# Patient Record
Sex: Female | Born: 1955 | Race: White | Hispanic: No | Smoking: Never smoker
Health system: Southern US, Community
[De-identification: ages and names within clinical notes are randomized; demographics above are authoritative.]

## PROBLEM LIST (undated history)

## (undated) DIAGNOSIS — J189 Pneumonia, unspecified organism: Secondary | ICD-10-CM

## (undated) DIAGNOSIS — R51 Headache: Secondary | ICD-10-CM

## (undated) DIAGNOSIS — G252 Other specified forms of tremor: Secondary | ICD-10-CM

## (undated) DIAGNOSIS — N189 Chronic kidney disease, unspecified: Secondary | ICD-10-CM

## (undated) DIAGNOSIS — I219 Acute myocardial infarction, unspecified: Secondary | ICD-10-CM

## (undated) DIAGNOSIS — K760 Fatty (change of) liver, not elsewhere classified: Secondary | ICD-10-CM

## (undated) DIAGNOSIS — IMO0001 Reserved for inherently not codable concepts without codable children: Secondary | ICD-10-CM

## (undated) DIAGNOSIS — B029 Zoster without complications: Secondary | ICD-10-CM

## (undated) DIAGNOSIS — B059 Measles without complication: Secondary | ICD-10-CM

## (undated) DIAGNOSIS — O2230 Deep phlebothrombosis in pregnancy, unspecified trimester: Secondary | ICD-10-CM

## (undated) DIAGNOSIS — C801 Malignant (primary) neoplasm, unspecified: Secondary | ICD-10-CM

## (undated) DIAGNOSIS — I10 Essential (primary) hypertension: Secondary | ICD-10-CM

## (undated) DIAGNOSIS — M25562 Pain in left knee: Secondary | ICD-10-CM

## (undated) DIAGNOSIS — Z6841 Body Mass Index (BMI) 40.0 and over, adult: Secondary | ICD-10-CM

## (undated) DIAGNOSIS — E785 Hyperlipidemia, unspecified: Secondary | ICD-10-CM

## (undated) DIAGNOSIS — M8580 Other specified disorders of bone density and structure, unspecified site: Secondary | ICD-10-CM

## (undated) DIAGNOSIS — G4730 Sleep apnea, unspecified: Secondary | ICD-10-CM

## (undated) DIAGNOSIS — R112 Nausea with vomiting, unspecified: Secondary | ICD-10-CM

## (undated) DIAGNOSIS — J40 Bronchitis, not specified as acute or chronic: Secondary | ICD-10-CM

## (undated) DIAGNOSIS — F329 Major depressive disorder, single episode, unspecified: Secondary | ICD-10-CM

## (undated) DIAGNOSIS — F32A Depression, unspecified: Secondary | ICD-10-CM

## (undated) DIAGNOSIS — I4891 Unspecified atrial fibrillation: Secondary | ICD-10-CM

## (undated) DIAGNOSIS — E119 Type 2 diabetes mellitus without complications: Secondary | ICD-10-CM

## (undated) DIAGNOSIS — R32 Unspecified urinary incontinence: Secondary | ICD-10-CM

## (undated) DIAGNOSIS — I2510 Atherosclerotic heart disease of native coronary artery without angina pectoris: Secondary | ICD-10-CM

## (undated) DIAGNOSIS — R519 Headache, unspecified: Secondary | ICD-10-CM

## (undated) DIAGNOSIS — N390 Urinary tract infection, site not specified: Secondary | ICD-10-CM

## (undated) DIAGNOSIS — F419 Anxiety disorder, unspecified: Secondary | ICD-10-CM

## (undated) DIAGNOSIS — I82409 Acute embolism and thrombosis of unspecified deep veins of unspecified lower extremity: Secondary | ICD-10-CM

## (undated) DIAGNOSIS — E039 Hypothyroidism, unspecified: Secondary | ICD-10-CM

## (undated) DIAGNOSIS — L7682 Other postprocedural complications of skin and subcutaneous tissue: Secondary | ICD-10-CM

## (undated) DIAGNOSIS — F319 Bipolar disorder, unspecified: Secondary | ICD-10-CM

## (undated) DIAGNOSIS — D649 Anemia, unspecified: Secondary | ICD-10-CM

## (undated) DIAGNOSIS — Z9889 Other specified postprocedural states: Secondary | ICD-10-CM

## (undated) DIAGNOSIS — W19XXXA Unspecified fall, initial encounter: Secondary | ICD-10-CM

## (undated) DIAGNOSIS — I509 Heart failure, unspecified: Secondary | ICD-10-CM

## (undated) DIAGNOSIS — K635 Polyp of colon: Secondary | ICD-10-CM

## (undated) HISTORY — DX: Body Mass Index (BMI) 40.0 and over, adult: Z684

## (undated) HISTORY — PX: TUBAL LIGATION: SHX77

## (undated) HISTORY — DX: Reserved for inherently not codable concepts without codable children: IMO0001

## (undated) HISTORY — DX: Chronic kidney disease, unspecified: N18.9

## (undated) HISTORY — PX: KNEE SURGERY: SHX244

## (undated) HISTORY — DX: Unspecified atrial fibrillation: I48.91

## (undated) HISTORY — DX: Sleep apnea, unspecified: G47.30

## (undated) HISTORY — PX: TONSILLECTOMY: SUR1361

## (undated) HISTORY — PX: BREAST MASS EXCISION: SHX1267

## (undated) HISTORY — DX: Hyperlipidemia, unspecified: E78.5

## (undated) HISTORY — DX: Acute embolism and thrombosis of unspecified deep veins of unspecified lower extremity: I82.409

## (undated) HISTORY — DX: Anxiety disorder, unspecified: F41.9

## (undated) HISTORY — DX: Type 2 diabetes mellitus without complications: E11.9

## (undated) HISTORY — DX: Acute myocardial infarction, unspecified: I21.9

## (undated) HISTORY — DX: Pain in left knee: M25.562

## (undated) HISTORY — DX: Essential (primary) hypertension: I10

## (undated) HISTORY — DX: Other specified disorders of bone density and structure, unspecified site: M85.80

## (undated) HISTORY — DX: Body mass index (BMI) 40.0-44.9, adult: E66.01

## (undated) HISTORY — PX: DILATION AND CURETTAGE OF UTERUS: SHX78

## (undated) HISTORY — DX: Atherosclerotic heart disease of native coronary artery without angina pectoris: I25.10

## (undated) HISTORY — DX: Depression, unspecified: F32.A

## (undated) HISTORY — PX: BASAL CELL CARCINOMA EXCISION: SHX1214

## (undated) HISTORY — DX: Major depressive disorder, single episode, unspecified: F32.9

## (undated) HISTORY — DX: Urinary tract infection, site not specified: N39.0

## (undated) HISTORY — DX: Polyp of colon: K63.5

---

## 1997-11-11 ENCOUNTER — Encounter: Payer: Self-pay | Admitting: Emergency Medicine

## 1997-11-11 ENCOUNTER — Emergency Department (HOSPITAL_COMMUNITY): Admission: EM | Admit: 1997-11-11 | Discharge: 1997-11-11 | Payer: Self-pay | Admitting: Emergency Medicine

## 1998-06-01 ENCOUNTER — Other Ambulatory Visit: Admission: RE | Admit: 1998-06-01 | Discharge: 1998-06-01 | Payer: Self-pay | Admitting: Obstetrics and Gynecology

## 1998-07-05 ENCOUNTER — Other Ambulatory Visit: Admission: RE | Admit: 1998-07-05 | Discharge: 1998-07-05 | Payer: Self-pay | Admitting: General Surgery

## 1998-08-03 ENCOUNTER — Ambulatory Visit (HOSPITAL_BASED_OUTPATIENT_CLINIC_OR_DEPARTMENT_OTHER): Admission: RE | Admit: 1998-08-03 | Discharge: 1998-08-03 | Payer: Self-pay | Admitting: General Surgery

## 1999-08-03 ENCOUNTER — Other Ambulatory Visit: Admission: RE | Admit: 1999-08-03 | Discharge: 1999-08-03 | Payer: Self-pay | Admitting: Obstetrics and Gynecology

## 1999-08-15 ENCOUNTER — Encounter: Admission: RE | Admit: 1999-08-15 | Discharge: 1999-08-15 | Payer: Self-pay | Admitting: Obstetrics and Gynecology

## 1999-08-15 ENCOUNTER — Encounter: Payer: Self-pay | Admitting: Obstetrics and Gynecology

## 2000-08-21 ENCOUNTER — Encounter: Admission: RE | Admit: 2000-08-21 | Discharge: 2000-08-21 | Payer: Self-pay | Admitting: Obstetrics and Gynecology

## 2000-08-21 ENCOUNTER — Encounter: Payer: Self-pay | Admitting: Obstetrics and Gynecology

## 2000-10-30 ENCOUNTER — Other Ambulatory Visit: Admission: RE | Admit: 2000-10-30 | Discharge: 2000-10-30 | Payer: Self-pay | Admitting: Obstetrics and Gynecology

## 2002-01-27 ENCOUNTER — Other Ambulatory Visit: Admission: RE | Admit: 2002-01-27 | Discharge: 2002-01-27 | Payer: Self-pay | Admitting: Obstetrics and Gynecology

## 2002-08-11 ENCOUNTER — Encounter: Admission: RE | Admit: 2002-08-11 | Discharge: 2002-08-11 | Payer: Self-pay | Admitting: Obstetrics and Gynecology

## 2002-08-11 ENCOUNTER — Encounter: Payer: Self-pay | Admitting: Obstetrics and Gynecology

## 2002-08-13 ENCOUNTER — Encounter: Payer: Self-pay | Admitting: Obstetrics and Gynecology

## 2002-08-13 ENCOUNTER — Ambulatory Visit (HOSPITAL_COMMUNITY): Admission: RE | Admit: 2002-08-13 | Discharge: 2002-08-13 | Payer: Self-pay | Admitting: Obstetrics and Gynecology

## 2003-04-20 ENCOUNTER — Other Ambulatory Visit: Admission: RE | Admit: 2003-04-20 | Discharge: 2003-04-20 | Payer: Self-pay | Admitting: Obstetrics and Gynecology

## 2003-04-21 ENCOUNTER — Encounter: Admission: RE | Admit: 2003-04-21 | Discharge: 2003-04-21 | Payer: Self-pay | Admitting: Obstetrics and Gynecology

## 2003-12-01 ENCOUNTER — Ambulatory Visit: Payer: Self-pay | Admitting: Family Medicine

## 2003-12-25 ENCOUNTER — Ambulatory Visit: Payer: Self-pay | Admitting: Family Medicine

## 2004-01-14 ENCOUNTER — Ambulatory Visit: Payer: Self-pay | Admitting: Family Medicine

## 2004-02-01 ENCOUNTER — Ambulatory Visit: Payer: Self-pay | Admitting: Family Medicine

## 2004-03-30 ENCOUNTER — Ambulatory Visit: Payer: Self-pay | Admitting: Family Medicine

## 2004-05-05 ENCOUNTER — Ambulatory Visit: Payer: Self-pay | Admitting: Family Medicine

## 2004-07-12 ENCOUNTER — Other Ambulatory Visit: Admission: RE | Admit: 2004-07-12 | Discharge: 2004-07-12 | Payer: Self-pay | Admitting: Obstetrics and Gynecology

## 2005-04-06 ENCOUNTER — Ambulatory Visit: Payer: Self-pay | Admitting: Family Medicine

## 2007-02-24 DIAGNOSIS — I219 Acute myocardial infarction, unspecified: Secondary | ICD-10-CM

## 2007-02-24 HISTORY — DX: Acute myocardial infarction, unspecified: I21.9

## 2007-03-05 ENCOUNTER — Ambulatory Visit: Payer: Self-pay | Admitting: Cardiology

## 2007-03-05 ENCOUNTER — Inpatient Hospital Stay (HOSPITAL_COMMUNITY): Admission: EM | Admit: 2007-03-05 | Discharge: 2007-03-07 | Payer: Self-pay | Admitting: Emergency Medicine

## 2007-03-05 DIAGNOSIS — I252 Old myocardial infarction: Secondary | ICD-10-CM | POA: Insufficient documentation

## 2007-03-20 ENCOUNTER — Ambulatory Visit: Payer: Self-pay | Admitting: Cardiology

## 2007-03-21 ENCOUNTER — Encounter (HOSPITAL_COMMUNITY): Admission: RE | Admit: 2007-03-21 | Discharge: 2007-06-19 | Payer: Self-pay | Admitting: Cardiology

## 2007-03-24 LAB — CONVERTED CEMR LAB: Pap Smear: NORMAL

## 2007-04-11 ENCOUNTER — Ambulatory Visit: Payer: Self-pay | Admitting: Cardiology

## 2007-04-16 ENCOUNTER — Ambulatory Visit: Payer: Self-pay

## 2007-04-16 ENCOUNTER — Ambulatory Visit: Payer: Self-pay | Admitting: Cardiovascular Disease

## 2007-05-09 ENCOUNTER — Ambulatory Visit: Payer: Self-pay | Admitting: Cardiology

## 2007-05-16 ENCOUNTER — Ambulatory Visit: Payer: Self-pay | Admitting: Cardiology

## 2007-05-16 ENCOUNTER — Observation Stay (HOSPITAL_COMMUNITY): Admission: EM | Admit: 2007-05-16 | Discharge: 2007-05-17 | Payer: Self-pay | Admitting: Emergency Medicine

## 2007-06-03 ENCOUNTER — Ambulatory Visit: Payer: Self-pay | Admitting: Cardiology

## 2007-06-20 ENCOUNTER — Encounter (HOSPITAL_COMMUNITY): Admission: RE | Admit: 2007-06-20 | Discharge: 2007-07-24 | Payer: Self-pay | Admitting: Cardiology

## 2007-07-24 LAB — HM MAMMOGRAPHY: HM Mammogram: NORMAL

## 2007-08-15 ENCOUNTER — Ambulatory Visit: Payer: Self-pay | Admitting: Cardiovascular Disease

## 2007-10-24 ENCOUNTER — Ambulatory Visit: Payer: Self-pay | Admitting: Cardiology

## 2007-12-05 ENCOUNTER — Ambulatory Visit: Payer: Self-pay | Admitting: Cardiovascular Disease

## 2008-01-20 ENCOUNTER — Encounter (INDEPENDENT_AMBULATORY_CARE_PROVIDER_SITE_OTHER): Payer: Self-pay | Admitting: *Deleted

## 2008-02-04 ENCOUNTER — Ambulatory Visit: Payer: Self-pay | Admitting: Family Medicine

## 2008-02-04 DIAGNOSIS — E079 Disorder of thyroid, unspecified: Secondary | ICD-10-CM | POA: Insufficient documentation

## 2008-02-04 DIAGNOSIS — I10 Essential (primary) hypertension: Secondary | ICD-10-CM | POA: Insufficient documentation

## 2008-02-04 DIAGNOSIS — E669 Obesity, unspecified: Secondary | ICD-10-CM | POA: Insufficient documentation

## 2008-02-05 ENCOUNTER — Encounter (INDEPENDENT_AMBULATORY_CARE_PROVIDER_SITE_OTHER): Payer: Self-pay | Admitting: *Deleted

## 2008-02-05 LAB — CONVERTED CEMR LAB: TSH: 1.79 microintl units/mL (ref 0.35–5.50)

## 2008-03-12 ENCOUNTER — Encounter (INDEPENDENT_AMBULATORY_CARE_PROVIDER_SITE_OTHER): Payer: Self-pay | Admitting: *Deleted

## 2008-03-12 ENCOUNTER — Ambulatory Visit: Payer: Self-pay | Admitting: Family Medicine

## 2008-03-13 ENCOUNTER — Encounter (INDEPENDENT_AMBULATORY_CARE_PROVIDER_SITE_OTHER): Payer: Self-pay | Admitting: *Deleted

## 2008-04-01 ENCOUNTER — Encounter: Payer: Self-pay | Admitting: Cardiology

## 2008-04-02 ENCOUNTER — Encounter: Payer: Self-pay | Admitting: Family Medicine

## 2008-04-02 ENCOUNTER — Ambulatory Visit: Payer: Self-pay | Admitting: Cardiovascular Disease

## 2008-05-18 ENCOUNTER — Telehealth: Payer: Self-pay | Admitting: Cardiology

## 2008-05-21 ENCOUNTER — Ambulatory Visit: Payer: Self-pay | Admitting: Internal Medicine

## 2008-05-21 ENCOUNTER — Ambulatory Visit: Payer: Self-pay | Admitting: Vascular Surgery

## 2008-05-21 ENCOUNTER — Telehealth: Payer: Self-pay | Admitting: Family Medicine

## 2008-05-21 ENCOUNTER — Encounter: Payer: Self-pay | Admitting: Internal Medicine

## 2008-05-21 ENCOUNTER — Observation Stay (HOSPITAL_COMMUNITY): Admission: EM | Admit: 2008-05-21 | Discharge: 2008-05-22 | Payer: Self-pay | Admitting: Emergency Medicine

## 2008-05-22 ENCOUNTER — Encounter: Payer: Self-pay | Admitting: Internal Medicine

## 2008-05-23 DIAGNOSIS — E785 Hyperlipidemia, unspecified: Secondary | ICD-10-CM | POA: Insufficient documentation

## 2008-05-23 DIAGNOSIS — I10 Essential (primary) hypertension: Secondary | ICD-10-CM | POA: Insufficient documentation

## 2008-05-23 DIAGNOSIS — IMO0001 Reserved for inherently not codable concepts without codable children: Secondary | ICD-10-CM

## 2008-05-23 HISTORY — DX: Reserved for inherently not codable concepts without codable children: IMO0001

## 2008-06-03 ENCOUNTER — Ambulatory Visit: Payer: Self-pay | Admitting: *Deleted

## 2008-06-03 ENCOUNTER — Inpatient Hospital Stay (HOSPITAL_COMMUNITY): Admission: AD | Admit: 2008-06-03 | Discharge: 2008-06-11 | Payer: Self-pay | Admitting: *Deleted

## 2008-06-16 ENCOUNTER — Other Ambulatory Visit (HOSPITAL_COMMUNITY): Admission: RE | Admit: 2008-06-16 | Discharge: 2008-06-23 | Payer: Self-pay | Admitting: Psychiatry

## 2008-06-23 ENCOUNTER — Ambulatory Visit: Payer: Self-pay

## 2008-06-23 ENCOUNTER — Encounter: Payer: Self-pay | Admitting: Family Medicine

## 2008-06-23 ENCOUNTER — Ambulatory Visit: Payer: Self-pay | Admitting: Cardiology

## 2008-06-23 ENCOUNTER — Ambulatory Visit: Payer: Self-pay | Admitting: Cardiovascular Disease

## 2008-06-23 DIAGNOSIS — R609 Edema, unspecified: Secondary | ICD-10-CM | POA: Insufficient documentation

## 2008-06-24 ENCOUNTER — Telehealth: Payer: Self-pay | Admitting: Cardiology

## 2008-07-02 ENCOUNTER — Encounter: Payer: Self-pay | Admitting: Family Medicine

## 2008-07-02 ENCOUNTER — Ambulatory Visit: Payer: Self-pay | Admitting: Cardiology

## 2008-07-02 DIAGNOSIS — I219 Acute myocardial infarction, unspecified: Secondary | ICD-10-CM | POA: Insufficient documentation

## 2008-07-03 LAB — CONVERTED CEMR LAB
Eosinophils Relative: 1.3 % (ref 0.0–5.0)
Lymphocytes Relative: 26 % (ref 12.0–46.0)
Monocytes Relative: 8.3 % (ref 3.0–12.0)
Neutrophils Relative %: 63.2 % (ref 43.0–77.0)
Platelets: 132 10*3/uL — ABNORMAL LOW (ref 150.0–400.0)
WBC: 4.8 10*3/uL (ref 4.5–10.5)

## 2008-08-12 ENCOUNTER — Telehealth (INDEPENDENT_AMBULATORY_CARE_PROVIDER_SITE_OTHER): Payer: Self-pay | Admitting: *Deleted

## 2008-08-20 ENCOUNTER — Telehealth: Payer: Self-pay | Admitting: Cardiology

## 2008-09-01 ENCOUNTER — Ambulatory Visit (HOSPITAL_COMMUNITY): Admission: RE | Admit: 2008-09-01 | Discharge: 2008-09-01 | Payer: Self-pay | Admitting: Obstetrics and Gynecology

## 2008-09-01 ENCOUNTER — Encounter (INDEPENDENT_AMBULATORY_CARE_PROVIDER_SITE_OTHER): Payer: Self-pay | Admitting: Obstetrics and Gynecology

## 2008-09-09 ENCOUNTER — Encounter (INDEPENDENT_AMBULATORY_CARE_PROVIDER_SITE_OTHER): Payer: Self-pay | Admitting: *Deleted

## 2008-09-09 ENCOUNTER — Ambulatory Visit: Payer: Self-pay | Admitting: Family Medicine

## 2008-11-19 ENCOUNTER — Ambulatory Visit: Payer: Self-pay | Admitting: Cardiology

## 2008-12-03 ENCOUNTER — Encounter (INDEPENDENT_AMBULATORY_CARE_PROVIDER_SITE_OTHER): Payer: Self-pay | Admitting: *Deleted

## 2008-12-16 ENCOUNTER — Ambulatory Visit: Payer: Self-pay | Admitting: Internal Medicine

## 2008-12-16 DIAGNOSIS — J019 Acute sinusitis, unspecified: Secondary | ICD-10-CM | POA: Insufficient documentation

## 2009-02-16 ENCOUNTER — Ambulatory Visit: Payer: Self-pay | Admitting: Family

## 2009-06-24 ENCOUNTER — Encounter: Payer: Self-pay | Admitting: Cardiology

## 2009-07-02 ENCOUNTER — Encounter: Payer: Self-pay | Admitting: Cardiology

## 2009-07-15 ENCOUNTER — Encounter: Payer: Self-pay | Admitting: Family Medicine

## 2009-07-15 HISTORY — PX: COLONOSCOPY: SHX174

## 2009-07-23 ENCOUNTER — Encounter (INDEPENDENT_AMBULATORY_CARE_PROVIDER_SITE_OTHER): Payer: Self-pay | Admitting: *Deleted

## 2009-08-25 ENCOUNTER — Telehealth (INDEPENDENT_AMBULATORY_CARE_PROVIDER_SITE_OTHER): Payer: Self-pay | Admitting: *Deleted

## 2009-09-10 ENCOUNTER — Encounter: Admission: RE | Admit: 2009-09-10 | Discharge: 2009-09-10 | Payer: Self-pay | Admitting: Obstetrics and Gynecology

## 2009-09-14 ENCOUNTER — Ambulatory Visit: Payer: Self-pay | Admitting: Family Medicine

## 2009-09-14 DIAGNOSIS — F319 Bipolar disorder, unspecified: Secondary | ICD-10-CM | POA: Insufficient documentation

## 2009-09-14 LAB — CONVERTED CEMR LAB
Albumin: 4.1 g/dL (ref 3.5–5.2)
Alkaline Phosphatase: 68 units/L (ref 39–117)
Basophils Relative: 0.3 % (ref 0.0–3.0)
Chloride: 102 meq/L (ref 96–112)
Cholesterol: 146 mg/dL (ref 0–200)
Creatinine, Ser: 1 mg/dL (ref 0.4–1.2)
Eosinophils Relative: 2.4 % (ref 0.0–5.0)
LDL Cholesterol: 74 mg/dL (ref 0–99)
MCV: 91.1 fL (ref 78.0–100.0)
Monocytes Absolute: 0.2 10*3/uL (ref 0.1–1.0)
Monocytes Relative: 5.4 % (ref 3.0–12.0)
Neutrophils Relative %: 55.4 % (ref 43.0–77.0)
Potassium: 4.1 meq/L (ref 3.5–5.1)
RBC: 4.53 M/uL (ref 3.87–5.11)
Total Protein: 6.7 g/dL (ref 6.0–8.3)
Triglycerides: 103 mg/dL (ref 0.0–149.0)
VLDL: 20.6 mg/dL (ref 0.0–40.0)
WBC: 3.9 10*3/uL — ABNORMAL LOW (ref 4.5–10.5)

## 2009-10-25 ENCOUNTER — Telehealth (INDEPENDENT_AMBULATORY_CARE_PROVIDER_SITE_OTHER): Payer: Self-pay | Admitting: *Deleted

## 2009-10-26 ENCOUNTER — Encounter: Payer: Self-pay | Admitting: Internal Medicine

## 2009-11-19 ENCOUNTER — Ambulatory Visit: Payer: Self-pay | Admitting: Cardiology

## 2009-12-02 ENCOUNTER — Ambulatory Visit: Payer: Self-pay | Admitting: Family Medicine

## 2009-12-21 ENCOUNTER — Ambulatory Visit: Payer: Self-pay | Admitting: Family Medicine

## 2010-01-05 ENCOUNTER — Ambulatory Visit: Payer: Self-pay | Admitting: Family Medicine

## 2010-01-05 ENCOUNTER — Ambulatory Visit (HOSPITAL_BASED_OUTPATIENT_CLINIC_OR_DEPARTMENT_OTHER)
Admission: RE | Admit: 2010-01-05 | Discharge: 2010-01-05 | Payer: Self-pay | Source: Home / Self Care | Attending: Family Medicine | Admitting: Family Medicine

## 2010-01-05 DIAGNOSIS — M25562 Pain in left knee: Secondary | ICD-10-CM | POA: Insufficient documentation

## 2010-01-05 DIAGNOSIS — M62838 Other muscle spasm: Secondary | ICD-10-CM | POA: Insufficient documentation

## 2010-01-05 DIAGNOSIS — R1011 Right upper quadrant pain: Secondary | ICD-10-CM | POA: Insufficient documentation

## 2010-01-05 DIAGNOSIS — M25561 Pain in right knee: Secondary | ICD-10-CM | POA: Insufficient documentation

## 2010-01-06 ENCOUNTER — Telehealth (INDEPENDENT_AMBULATORY_CARE_PROVIDER_SITE_OTHER): Payer: Self-pay | Admitting: *Deleted

## 2010-02-11 ENCOUNTER — Other Ambulatory Visit: Payer: Self-pay | Admitting: Family Medicine

## 2010-02-11 ENCOUNTER — Ambulatory Visit
Admission: RE | Admit: 2010-02-11 | Discharge: 2010-02-11 | Payer: Self-pay | Source: Home / Self Care | Attending: Family Medicine | Admitting: Family Medicine

## 2010-02-11 ENCOUNTER — Encounter: Payer: Self-pay | Admitting: Family Medicine

## 2010-02-11 DIAGNOSIS — D485 Neoplasm of uncertain behavior of skin: Secondary | ICD-10-CM | POA: Insufficient documentation

## 2010-02-11 LAB — CBC WITH DIFFERENTIAL/PLATELET
Basophils Absolute: 0 10*3/uL (ref 0.0–0.1)
Basophils Relative: 0.4 % (ref 0.0–3.0)
Eosinophils Absolute: 0.1 10*3/uL (ref 0.0–0.7)
Eosinophils Relative: 1.6 % (ref 0.0–5.0)
HCT: 42.4 % (ref 36.0–46.0)
Hemoglobin: 14.8 g/dL (ref 12.0–15.0)
Lymphocytes Relative: 26.8 % (ref 12.0–46.0)
Lymphs Abs: 1.2 10*3/uL (ref 0.7–4.0)
MCHC: 35 g/dL (ref 30.0–36.0)
MCV: 90.1 fl (ref 78.0–100.0)
Monocytes Absolute: 0.3 10*3/uL (ref 0.1–1.0)
Monocytes Relative: 7.1 % (ref 3.0–12.0)
Neutro Abs: 2.8 10*3/uL (ref 1.4–7.7)
Neutrophils Relative %: 64.1 % (ref 43.0–77.0)
Platelets: 114 10*3/uL — ABNORMAL LOW (ref 150.0–400.0)
RBC: 4.71 Mil/uL (ref 3.87–5.11)
RDW: 13.4 % (ref 11.5–14.6)
WBC: 4.4 10*3/uL — ABNORMAL LOW (ref 4.5–10.5)

## 2010-02-11 LAB — LIPID PANEL
Cholesterol: 140 mg/dL (ref 0–200)
HDL: 55.6 mg/dL (ref >39.00)
LDL Cholesterol: 69 mg/dL (ref 0–99)
Total CHOL/HDL Ratio: 3
Triglycerides: 77 mg/dL (ref 0.0–149.0)
VLDL: 15.4 mg/dL (ref 0.0–40.0)

## 2010-02-11 LAB — HEPATIC FUNCTION PANEL
ALT: 15 U/L (ref 0–35)
AST: 17 U/L (ref 0–37)
Albumin: 4.1 g/dL (ref 3.5–5.2)
Alkaline Phosphatase: 78 U/L (ref 39–117)
Bilirubin, Direct: 0.2 mg/dL (ref 0.0–0.3)
Total Bilirubin: 1 mg/dL (ref 0.3–1.2)
Total Protein: 7 g/dL (ref 6.0–8.3)

## 2010-02-11 LAB — BASIC METABOLIC PANEL
BUN: 20 mg/dL (ref 6–23)
CO2: 33 mEq/L — ABNORMAL HIGH (ref 19–32)
Calcium: 9.6 mg/dL (ref 8.4–10.5)
Chloride: 101 mEq/L (ref 96–112)
Creatinine, Ser: 1 mg/dL (ref 0.4–1.2)
GFR: 63.52 mL/min (ref >60.00)
Glucose, Bld: 77 mg/dL (ref 70–99)
Potassium: 4.1 mEq/L (ref 3.5–5.1)
Sodium: 142 mEq/L (ref 135–145)

## 2010-02-11 LAB — TSH: TSH: 1.36 u[IU]/mL (ref 0.35–5.50)

## 2010-02-14 LAB — CONVERTED CEMR LAB: Vit D, 25-Hydroxy: 34 ng/mL (ref 30–89)

## 2010-02-20 LAB — CONVERTED CEMR LAB
ALT: 20 units/L (ref 0–35)
AST: 22 units/L (ref 0–37)
Albumin: 3.7 g/dL (ref 3.5–5.2)
Alkaline Phosphatase: 66 units/L (ref 39–117)
BUN: 21 mg/dL (ref 6–23)
Basophils Absolute: 0 10*3/uL (ref 0.0–0.1)
Basophils Relative: 0.2 % (ref 0.0–3.0)
CO2: 30 meq/L (ref 19–32)
Chloride: 104 meq/L (ref 96–112)
Creatinine, Ser: 1 mg/dL (ref 0.4–1.2)
Eosinophils Absolute: 0.1 10*3/uL (ref 0.0–0.7)
Eosinophils Relative: 2 % (ref 0.0–5.0)
Hemoglobin: 14.4 g/dL (ref 12.0–15.0)
LDL Cholesterol: 83 mg/dL (ref 0–99)
Lymphocytes Relative: 22 % (ref 12.0–46.0)
MCV: 89.8 fL (ref 78.0–100.0)
Neutrophils Relative %: 68.6 % (ref 43.0–77.0)
RBC: 4.6 M/uL (ref 3.87–5.11)
Total CHOL/HDL Ratio: 3.4
Total Protein: 6.7 g/dL (ref 6.0–8.3)
VLDL: 21 mg/dL (ref 0–40)
WBC: 4.4 10*3/uL — ABNORMAL LOW (ref 4.5–10.5)

## 2010-02-21 ENCOUNTER — Other Ambulatory Visit: Payer: Self-pay | Admitting: Obstetrics and Gynecology

## 2010-02-21 DIAGNOSIS — Z09 Encounter for follow-up examination after completed treatment for conditions other than malignant neoplasm: Secondary | ICD-10-CM

## 2010-02-22 NOTE — Procedures (Signed)
Summary: Colonoscopy/[facility]  Colonoscopy/[facility]   Imported By: Lanelle Bal 08/02/2009 09:49:31  _____________________________________________________________________  External Attachment:    Type:   Image     Comment:   External Document

## 2010-02-22 NOTE — Miscellaneous (Signed)
Summary: colon report  Clinical Lists Changes  Observations: Added new observation of COLONRECACT: Repeat colonoscopy in 10 years.   (07/15/2009 8:51) Added new observation of COLONOSCOPY:  Results: Normal. Location:  [facility].    (07/15/2009 8:51)        Colonoscopy  Procedure date:  07/15/2009  Findings:       Results: Normal. Location:  [facility].     Comments:      Repeat colonoscopy in 10 years.

## 2010-02-22 NOTE — Assessment & Plan Note (Signed)
Summary: F1Y   Visit Type:  Follow-up Primary Provider:  Neena Rhymes MD  CC:  CAD.  History of Present Illness: The patient returns for followup of known coronary disease. 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 Last cath was  2010 April and it seemed to be improved flow in this.  She has had one episode of chest discomfort that woke her from her sleep on September 30. She took one nitroglycerin and it went away. She went back to bed. She has had no recurrence of this. She has been exercising routinely. She has lost 70 pounds through diet! She denies any shortness of breath, PND or orthopnea. She has had no palpitations, presyncope or syncope.  Current Medications (verified): 1)  Nitrostat 0.4 Mg Subl (Nitroglycerin) .... As Needed 2)  Simvastatin 20 Mg Tabs (Simvastatin) .[name].. 1 By Mouth At Bedtime- 3)  Bayer Low Strength 81 Mg Tbec (Aspirin) .[name].. 1 By Mouth Once Daily 4)  Plavix 75 Mg Tabs (Clopidogrel Bisulfate) .[name].. 1 By Mouth Once Daily 5)  Depakote 500 Mg Tbec (Divalproex Sodium) .... 3 By Mouth Once Daily 6)  Clonazepam 0.5 Mg Tabs (Clonazepam) .... As Needed 7)  Trazodone Hcl 50 Mg Tabs (Trazodone Hcl) .[name].. 1 By Mouth As Needed 8)  Abilify 10 Mg Tabs (Aripiprazole) .... 1/2 By Mouth Once Daily 9)  Multivitamins   Tabs (Multiple Vitamin) .... Daily 10)  Maxair Autohaler 200 Mcg/inh Aerb (Pirbuterol Acetate) .... As Needed 11)  Maxzide-25 37.5-25 Mg Tabs (Triamterene-Hctz) .... One Po Daily  Allergies (verified): 1)  ! * Eggs 2)  ! Asa 3)  ! Barbiturates 4)  ! * Carbamazapine 5)  ! * Clarithromycin 6)  ! Coumadin 7)  ! Erythromycin 8)  ! * Indinavir 9)  ! * Itraconazole 10)  ! Ketoconazole 11)  ! * Nefazodone 12)  ! * Nelfinavir 13)  ! * Phenytoin 14)  ! * Rifampin 15)  ! * Ritonavir 16)  ! * Troleandomycin  Past History:  Past Medical History: Reviewed history from 11/19/2008 and no changes required.  1. 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   2. Well-preserved ejection fraction.   3. Hypertension.   4. Depression.   5. Superficial lower extremity venous thrombosis.   6. Dyslipidemia  7. Psychiatric hospitalization 5/10  Past Surgical History: Reviewed history from 05/23/2008 and no changes required. 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   Vital Signs:  Patient profile:   55 year old female Height:      71 inches Weight:      227 pounds BMI:     31.77 Pulse rate:   82 / minute Resp:     16 per minute BP sitting:   108 / 70  (right arm)  Vitals Entered By: Marrion Coy, CNA (November 19, 2009 2:01 PM)  Physical Exam  General:  Well developed, well nourished, in no acute distress. Head:  normocephalic and atraumatic Neck:  Neck supple, no JVD. No masses, thyromegaly or abnormal cervical nodes. Chest Wall:  no deformities or breast masses noted Lungs:  Clear bilaterally to auscultation and percussion. Heart:  Non-displaced PMI, chest non-tender; regular rate and rhythm, S1, S2 without murmurs, rubs or gallops. Carotid upstroke normal, no bruit. Normal abdominal aortic  size, no bruits. Femorals normal pulses, no bruits. Pedals normal pulses. No edema, no varicosities. Abdomen:  Bowel sounds positive; abdomen soft and non-tender without masses, organomegaly, or hernias noted. No hepatosplenomegaly. Msk:  Back normal, normal gait. Muscle strength and tone normal. Pulses:  pulses normal in all 4 extremities Extremities:  No clubbing or cyanosis. Neurologic:  Alert and oriented x 3. Skin:  Intact without lesions or rashes. Cervical Nodes:  no significant adenopathy Inguinal Nodes:  no significant adenopathy Psych:  Normal affect.   EKG  Procedure date:  11/19/2009  Findings:      Sinus rhythm, rate 82, axis within normal  limits, intervals within normal limits, no acute ST-T wave changes  Impression & Recommendations:  Problem # 1:  MYOCARDIAL INFARCTION, HX OF (ICD-412) She has had one episode of chest pain no further episodes. No further investigation is indicated though she will continue with risk reduction. Orders: EKG w/ Interpretation (93000)  Problem # 2:  HYPERTENSION (ICD-401.9) She loses weight we may be able to reduce her Maxide were eliminated. For now she will remain on the meds listed.  Problem # 3:  DYSLIPIDEMIA (ICD-272.4) Her lipids were excellent in August. No change in therapy is indicated.  Patient Instructions: 1)  Your physician recommends that you schedule a follow-up appointment in: 12 months with Dr Antoine Poche 2)  Your physician recommends that you continue on your current medications as directed. Please refer to the Current Medication list given to you today.

## 2010-02-22 NOTE — Miscellaneous (Signed)
[facility]   Imported By: Lanelle Bal 11/03/2009 13:13:51  _____________________________________________________________________  External Attachment:    Type:   Image     Comment:   External Document

## 2010-02-22 NOTE — Assessment & Plan Note (Signed)
Summary: SINUS INFECTION/KN   Vital Signs:  Patient profile:   55 year old female Height:      71 inches Weight:      225 pounds O2 Sat:      99 % on Room air Temp:     98.0 degrees F oral Pulse rate:   85 / minute BP sitting:   110 / 76  (left arm)  Vitals Entered By: Jeremy Johann CMA (December 02, 2009 8:58 AM)  O2 Flow:  Room air CC: cough, facial pain, drainage, sore throatx2days   History of Present Illness: 55 yo woman here today for ? sinus infxn.  sxs initially started as a cold 1 week ago and then 2 days ago developed facial pain and pressure.  no fever.  bilateral ear pain, nasal congestion, sore throat only due to nasal drainage.  no cough.  + sick contacts.  Current Medications (verified): 1)  Nitrostat 0.4 Mg Subl (Nitroglycerin) .... As Needed 2)  Simvastatin 20 Mg Tabs (Simvastatin) .[name].. 1 By Mouth At Bedtime- 3)  Bayer Low Strength 81 Mg Tbec (Aspirin) .[name].. 1 By Mouth Once Daily 4)  Plavix 75 Mg Tabs (Clopidogrel Bisulfate) .[name].. 1 By Mouth Once Daily 5)  Depakote 500 Mg Tbec (Divalproex Sodium) .... 3 By Mouth Once Daily 6)  Clonazepam 0.5 Mg Tabs (Clonazepam) .... As Needed 7)  Trazodone Hcl 50 Mg Tabs (Trazodone Hcl) .[name].. 1 By Mouth As Needed 8)  Abilify 10 Mg Tabs (Aripiprazole) .... 1/2 By Mouth Once Daily 9)  Multivitamins   Tabs (Multiple Vitamin) .... Daily 10)  Maxair Autohaler 200 Mcg/inh Aerb (Pirbuterol Acetate) .... As Needed 11)  Maxzide-25 37.5-25 Mg Tabs (Triamterene-Hctz) .... One Po Daily  Allergies (verified): 1)  ! * Eggs 2)  ! Asa 3)  ! Barbiturates 4)  ! * Carbamazapine 5)  ! * Clarithromycin 6)  ! Coumadin 7)  ! Erythromycin 8)  ! * Indinavir 9)  ! * Itraconazole 10)  ! Ketoconazole 11)  ! * Nefazodone 12)  ! * Nelfinavir 13)  ! * Phenytoin 14)  ! * Rifampin 15)  ! * Ritonavir 16)  ! * Troleandomycin  Review of Systems      See HPI  Physical Exam  General:  Well-developed,well-nourished,in no acute distress;  alert,appropriate and cooperative throughout examination Head:  Normocephalic and atraumatic without obvious abnormalities. No apparent alopecia or balding.  + TTP over frontal and maxillary sinuses Eyes:  no injxn or inflammation Ears:  TMs retracted bilaterally Nose:  + congestion Mouth:  Oral mucosa and oropharynx without lesions or exudates.  Teeth in good repair. Neck:  No deformities, masses, or tenderness noted. Lungs:  Normal respiratory effort, chest expands symmetrically. Lungs are clear to auscultation, no crackles or wheezes. Heart:  Normal rate and regular rhythm. S1 and S2 normal without gallop, murmur, click, rub or other extra sounds.   Impression & Recommendations:  Problem # 1:  SINUSITIS- ACUTE-NOS (ICD-461.9) Assessment Unchanged pt w/ sinus infxn.  start abx.  reviewed supportive care and red flags that should prompt return.  Pt expresses understanding and is in agreement w/ this plan. Her updated medication list for this problem includes:    Amoxicillin 500 Mg Tabs (Amoxicillin) .[name]... 2 tabs by mouth two times a day x10 days.  take w/ food.  Complete Medication List: 1)  Nitrostat 0.4 Mg Subl (Nitroglycerin) .... As needed 2)  Simvastatin 20 Mg Tabs (Simvastatin) .[name].. 1 by mouth at bedtime- 3)  Bayer  Low Strength 81 Mg Tbec (Aspirin) .[name].. 1 by mouth once daily 4)  Plavix 75 Mg Tabs (Clopidogrel bisulfate) .[name].. 1 by mouth once daily 5)  Depakote 500 Mg Tbec (Divalproex sodium) .... 3 by mouth once daily 6)  Clonazepam 0.5 Mg Tabs (Clonazepam) .... As needed 7)  Trazodone Hcl 50 Mg Tabs (Trazodone hcl) .[name].. 1 by mouth as needed 8)  Abilify 10 Mg Tabs (Aripiprazole) .... 1/2 by mouth once daily 9)  Multivitamins Tabs (Multiple vitamin) .... Daily 10)  Maxair Autohaler 200 Mcg/inh Aerb (Pirbuterol acetate) .... As needed 11)  Maxzide-25 37.5-25 Mg Tabs (Triamterene-hctz) .... One po daily 12)  Amoxicillin 500 Mg Tabs (Amoxicillin) .... 2 tabs by mouth two times a day  x10 days.  take w/ food.  Patient Instructions: 1)  This is definitely a sinus infection 2)  Take the Amoxicillin as directed- take w/ food to avoid upset stomach 3)  Drink plenty of fluids 4)  REST!!! 5)  Call with any questions or concerns! 6)  Hang in there!! Prescriptions: AMOXICILLIN 500 MG TABS (AMOXICILLIN) 2 tabs by mouth two times a day x10 days.  take w/ food.  #40 x 0   Entered and Authorized by:   Neena Rhymes MD   Signed by:   Neena Rhymes MD on 12/02/2009   Method used:   [facility]  [address] [phone][facility] (retail[facility]       [address]       [place], [place]  [address]       Ph: [phone] or [phone]       Fax: [phone]   RxID:   [id number]    Orders Added: 1)  Est. Patient Level III [10626]

## 2010-02-22 NOTE — Progress Notes (Signed)
Summary: simvastatin refill   Phone Note Refill Request Message from:  Fax from Pharmacy on October 25, 2009 1:25 PM  Refills Requested: Medication #1:  SIMVASTATIN 20 MG TABS 1 by mouth at bedtime- [facility] [place] [facility] fax [phone]  Initial call taken by: Okey Regal Spring,  October 25, 2009 1:25 PM    Prescriptions: SIMVASTATIN 20 MG TABS (SIMVASTATIN) 1 by mouth at bedtime-  #30 x 6   Entered by:   Doristine Devoid CMA   Authorized by:   Neena Rhymes MD   Signed by:   Doristine Devoid CMA on 10/25/2009   Method used:   [facility]        [facility]  [address] [phone][facility] (retail)       [address]       [place], [place]  [address]       Ph: [phone] or [phone]       Fax: [phone]   RxID:   [id number]

## 2010-02-22 NOTE — Assessment & Plan Note (Signed)
Summary: bronchtisis/kdc   Vital Signs:  Patient profile:   55 year old female Height:      71 inches Weight:      226 pounds BMI:     31.63 Temp:     87.8 degrees F oral BP sitting:   120 / 88  Vitals Entered By: Kandice Hams (February 16, 2009 3:23 PM) CC: c/o cough with production, yellow, congestion, facial pain, Abdominal Pain   Primary Care Provider:  Neena Rhymes MD  CC:  c/o cough with production, yellow, congestion, facial pain, and Abdominal Pain.  History of Present Illness: Kathleen [name] is a 55 year old female who presents with c/o cough, sinus pressure,  yellow nasal discharge- yellow discharge with coughing as well.  Has not used any otc meds.  Symptoms started over 1 week ago.    Dyspepsia History:      The patient has positive alarm features of dyspepsia which include dysphagia.    Allergies: 1)  ! * Eggs 2)  ! Asa 3)  ! Barbiturates 4)  ! * Carbamazapine 5)  ! * Clarithromycin 6)  ! Coumadin 7)  ! Erythromycin 8)  ! * Indinavir 9)  ! * Itraconazole 10)  ! Ketoconazole 11)  ! * Nefazodone 12)  ! * Nelfinavir 13)  ! * Phenytoin 14)  ! * Rifampin 15)  ! * Ritonavir 16)  ! * Troleandomycin  Review of Systems       Denies fever, + cough, +sore throat from nasal drip.  Physical Exam  General:  Well-developed,well-nourished,in no acute distress; alert,appropriate and cooperative throughout examination Head:  Normocephalic and atraumatic without obvious abnormalities. No apparent alopecia or balding. Eyes:  PERRLA Mouth:  Oral mucosa and oropharynx without lesions or exudates.  Teeth in good repair. Neck:  No deformities, masses, or tenderness noted. Lungs:  Normal respiratory effort, chest expands symmetrically. Lungs are clear to auscultation, no crackles or wheezes. Heart:  Normal rate and regular rhythm. S1 and S2 normal without gallop, murmur, click, rub or other extra sounds. Neurologic:  fine resting tremor   Impression &  Recommendations:  Problem # 1:  SINUSITIS- ACUTE-NOS (ICD-461.9) Assessment New Will plan to treat with Amoxicillin.  Patient instructed to  call if you develop fever over 101, increasing sinus pressure, pain with eye movement, increased facial tenderness of swelling, or if you develop visual changes.  Complete Medication List: 1)  Nitrostat 0.4 Mg Subl (Nitroglycerin) .... As needed 2)  Simvastatin 20 Mg Tabs (Simvastatin) .[name].. 1 by mouth at bedtime--out 3)  Bayer Low Strength 81 Mg Tbec (Aspirin) .[name].. 1 by mouth once daily 4)  Plavix 75 Mg Tabs (Clopidogrel bisulfate) .[name].. 1 by mouth once daily 5)  Fish Oil  6)  D-3  7)  Depakote 500 Mg Tbec (Divalproex sodium) .... 3 by mouth once daily 8)  Clonazepam 0.5 Mg Tabs (Clonazepam) .... As needed 9)  Trazodone Hcl 50 Mg Tabs (Trazodone hcl) .[name].. 1 by mouth as needed 10)  Abilify 10 Mg Tabs (Aripiprazole) .... 1/2 by mouth once daily 11)  Multivitamins Tabs (Multiple vitamin) .... Daily 12)  Maxair Autohaler 200 Mcg/inh Aerb (Pirbuterol acetate) .... As needed 13)  Maxzide-25 37.5-25 Mg Tabs (Triamterene-hctz) .... One po daily 14)  Amoxicillin 500 Mg Caps (Amoxicillin) .[name].. 1 three times a day 15)  Amoxicillin 500 Mg Cap (Amoxicillin) .... Take 1 capsule by mouth three times a day x 10 days   Patient Instructions: 1)  Call if you develop  fever over 101, increasing sinus pressure, pain with eye movement, increased facial tenderness of swelling, or if you develop visual changes. 2)  Call if symptoms not resolved when you complete antibiotics. Prescriptions: AMOXICILLIN 500 MG CAP (AMOXICILLIN) Take 1 capsule by mouth three times a day X 10 days  #30 x 0   Entered and Authorized by:   Lemont Fillers FNP   Signed by:   Lemont Fillers FNP on 02/16/2009   Method used:   [facility]        [facility]  [address] [phone]* (retail)       [address]       [place], [place]  [address]       Ph: [phone]  or [phone]       Fax: [phone]   RxID:   [id number]

## 2010-02-22 NOTE — Assessment & Plan Note (Signed)
Summary: RTO & LAB/CBST   Vital Signs:  Patient profile:   55 year old female Weight:      223 pounds Pulse rate:   78 / minute BP sitting:   110 / 80  (left arm)  Vitals Entered By: Doristine Devoid CMA (September 14, 2009 8:52 AM) CC: roa and labs   History of Present Illness: 55 yo woman here today for f/u on  1) HTN- BP well controlled on maxzide.  no CP, SOB, HAs, visual changes, edema.  [facility]  2) Hyperlipidemia- due for labs.  no N/V, myalgias, abd pain.  3) Obesity- trying very hard to lose weight.  exercising, eating healthy.  wasn't aware that meds caused weight gain  4) Bipolar- seeing Dr Nolen Mu.  stable and doing well on Abilify and Depakote.  Preventive Screening-Counseling & Management  Alcohol-Tobacco     Alcohol drinks/day: 0     Smoking Status: never  Caffeine-Diet-Exercise     Does Patient Exercise: yes     [facility]      Drug Use:  never.    Problems Prior to Update: 1)  Sinusitis- Acute-nos  (ICD-461.9) 2)  Acut Myocard Infarct Uns Site Subsqt Epis Care  (ICD-410.92) 3)  Edema  (ICD-782.3) 4)  Hypertension  (ICD-401.9) 5)  Dyslipidemia  (ICD-272.4) 6)  Special Screening For Malignant Neoplasms Colon  (ICD-V76.51) 7)  Healthy Adult Female  (ICD-V70.0) 8)  Obesity  (ICD-278.00) 9)  Unspecified Disorder of Thyroid  (ICD-246.9) 10)  Hypertension, Benign Essential  (ICD-401.1) 11)  Myocardial Infarction, Hx of  (ICD-412)  Current Medications (verified): 1)  Nitrostat 0.4 Mg Subl (Nitroglycerin) .... As Needed 2)  Simvastatin 20 Mg Tabs (Simvastatin) .[name].. 1 By Mouth At Bedtime- 3)  Bayer Low Strength 81 Mg Tbec (Aspirin) .[name].. 1 By Mouth Once Daily 4)  Plavix 75 Mg Tabs (Clopidogrel Bisulfate) .[name].. 1 By Mouth Once Daily 5)  Depakote 500 Mg Tbec (Divalproex Sodium) .... 3 By Mouth Once Daily 6)  Clonazepam 0.5 Mg Tabs (Clonazepam) .... As Needed 7)  Trazodone Hcl 50 Mg Tabs (Trazodone Hcl) .[name].. 1 By Mouth As  Needed 8)  Abilify 10 Mg Tabs (Aripiprazole) .... 1/2 By Mouth Once Daily 9)  Multivitamins   Tabs (Multiple Vitamin) .... Daily 10)  Maxair Autohaler 200 Mcg/inh Aerb (Pirbuterol Acetate) .... As Needed 11)  Maxzide-25 37.5-25 Mg Tabs (Triamterene-Hctz) .... One Po Daily  Allergies (verified): 1)  ! * Eggs 2)  ! Asa 3)  ! Barbiturates 4)  ! * Carbamazapine 5)  ! * Clarithromycin 6)  ! Coumadin 7)  ! Erythromycin 8)  ! * Indinavir 9)  ! * Itraconazole 10)  ! Ketoconazole 11)  ! * Nefazodone 12)  ! * Nelfinavir 13)  ! * Phenytoin 14)  ! * Rifampin 15)  ! * Ritonavir 16)  ! * Troleandomycin  Past History:  Past Medical History: Last updated: 11/19/2008  1. 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   2. Well-preserved ejection fraction.   3. Hypertension.   4. Depression.   5. Superficial lower extremity venous thrombosis.   6. Dyslipidemia  7. Psychiatric hospitalization 5/10  Past Surgical History: Last updated: 05/23/2008 Benign tumor removed from breast age 50 Removed cartilage rt knee Tubal ligation Tonsiilectomy  Social History: Last updated: 05/23/2008  She lives alone in [place] and works at [employer] as a  Administrator, arts.  She has no history of alcohol, tobacco,   or drug abuse.   Social History: Drug Use:  never  Review of Systems      See HPI  Physical Exam  General:  Well-developed,well-nourished,in no acute distress; alert,appropriate and cooperative throughout examination Head:  Normocephalic and atraumatic without obvious abnormalities. No apparent alopecia or balding. Neck:  No deformities, masses, or tenderness noted. Lungs:  Normal respiratory effort, chest expands symmetrically. Lungs are clear to auscultation, no crackles or wheezes. Heart:  Normal rate and regular rhythm. S1 and S2 normal without gallop, murmur, click, rub or other extra sounds. Pulses:  +2 carotid,  radial, DP Extremities:  no C/C/E Cervical Nodes:  No lymphadenopathy noted Psych:  Oriented X3, memory intact for recent and remote, normally interactive, good eye contact, not anxious appearing, and not depressed appearing.     Impression & Recommendations:  Problem # 1:  HYPERTENSION (ICD-401.9) Assessment Unchanged BP well controlled.  due for labs.  no med changes at this time. Her updated medication list for this problem includes:    Maxzide-25 37.5-25 Mg Tabs (Triamterene-hctz) ..... One po daily  Orders: TLB-BMP (Basic Metabolic Panel-BMET) (80048-METABOL) TLB-CBC Platelet - w/Differential (85025-CBCD) TLB-TSH (Thyroid Stimulating Hormone) (84443-TSH)  Problem # 2:  DYSLIPIDEMIA (ICD-272.4) Assessment: Unchanged due for labs.  adjust meds as needed. Her updated medication list for this problem includes:    Simvastatin 20 Mg Tabs (Simvastatin) .[name]... 1 by mouth at bedtime-  Orders: Venipuncture ([address]109) TLB-Lipid Panel (80061-LIPID) TLB-Hepatic/Liver Function Pnl (80076-HEPATIC)  Problem # 3:  OBESITY (ICD-278.00) Assessment: Unchanged applauded pt's efforts at diet and exercise.  she continues to lose weight.  has lost >70lbs to this point.  Problem # 4:  BIPOLAR DISORDER UNSPECIFIED (ICD-296.80) Assessment: New follows w/ Dr Nolen Mu  Complete Medication List: 1)  Nitrostat 0.4 Mg Subl (Nitroglycerin) .... As needed 2)  Simvastatin 20 Mg Tabs (Simvastatin) .[name].. 1 by mouth at bedtime- 3)  Bayer Low Strength 81 Mg Tbec (Aspirin) .[name].. 1 by mouth once daily 4)  Plavix 75 Mg Tabs (Clopidogrel bisulfate) .[name].. 1 by mouth once daily 5)  Depakote 500 Mg Tbec (Divalproex sodium) .... 3 by mouth once daily 6)  Clonazepam 0.5 Mg Tabs (Clonazepam) .... As needed 7)  Trazodone Hcl 50 Mg Tabs (Trazodone hcl) .[name].. 1 by mouth as needed 8)  Abilify 10 Mg Tabs (Aripiprazole) .... 1/2 by mouth once daily 9)  Multivitamins Tabs (Multiple vitamin) .... Daily 10)  Maxair Autohaler 200  Mcg/inh Aerb (Pirbuterol acetate) .... As needed 11)  Maxzide-25 37.5-25 Mg Tabs (Triamterene-hctz) .... One po daily  Patient Instructions: 1)  Follow up in 6 months for your complete physical- do not eat before this appt 2)  Keep up the good work on diet and exercise- you look great!!! 3)  We'll notify you of your lab results 4)  Call with any questions or concerns 5)  Good luck with back to school!!

## 2010-02-22 NOTE — Letter (Signed)
Summary: [facility] [employer] [facility] [employer]  [place] [facility] [employer] [facility] [employer]   Imported By: Marylou Mccoy 10/06/2009 08:59:48  _____________________________________________________________________  External Attachment:    Type:   Image     Comment:   External Document

## 2010-02-22 NOTE — Letter (Signed)
Summary: [facility]   [facility]   Imported By: Roderic Ovens 12/08/2009 13:44:27  _____________________________________________________________________  External Attachment:    Type:   Image     Comment:   External Document

## 2010-02-22 NOTE — Progress Notes (Signed)
Summary: simvastatin refill   Phone Note Refill Request Message from:  Fax from Pharmacy on August 25, 2009 8:24 AM  Refills Requested: Medication #1:  SIMVASTATIN 20 MG TABS 1 by mouth at bedtime[facility] fax [phone]  Initial call taken by: Okey Regal [place],  August 25, 2009 8:26 AM    New/Updated Medications: SIMVASTATIN 20 MG TABS (SIMVASTATIN) 1 by mouth at bedtime- DUE FOR CPX AND LABS FOR FURTHER REFILLS Prescriptions: SIMVASTATIN 20 MG TABS (SIMVASTATIN) 1 by mouth at bedtime- DUE FOR CPX AND LABS FOR FURTHER REFILLS  #30 x 0   Entered by:   Doristine Devoid CMA   Authorized by:   Neena Rhymes MD   Signed by:   Doristine Devoid CMA on 08/25/2009   Method used:   [facility]        [facility]  [address] [phone][facility] (retail)       [address]       [place], [place]  [address]       Ph: [phone] or [phone]       Fax: [phone]   RxID:   [id number]
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
## 2010-03-10 ENCOUNTER — Ambulatory Visit
Admission: RE | Admit: 2010-03-10 | Discharge: 2010-03-10 | Disposition: A | Discharge status: Home / Self Care | Payer: Self-pay | Source: Ambulatory Visit | Attending: Obstetrics and Gynecology | Admitting: Obstetrics and Gynecology

## 2010-03-10 DIAGNOSIS — Z09 Encounter for follow-up examination after completed treatment for conditions other than malignant neoplasm: Secondary | ICD-10-CM

## 2010-03-29 ENCOUNTER — Encounter: Payer: Self-pay | Admitting: Internal Medicine

## 2010-03-29 ENCOUNTER — Ambulatory Visit (INDEPENDENT_AMBULATORY_CARE_PROVIDER_SITE_OTHER): Payer: BC Managed Care – PPO | Admitting: Internal Medicine

## 2010-03-29 DIAGNOSIS — S46909A Unspecified injury of unspecified muscle, fascia and tendon at shoulder and upper arm level, unspecified arm, initial encounter: Secondary | ICD-10-CM | POA: Insufficient documentation

## 2010-03-29 DIAGNOSIS — S4980XA Other specified injuries of shoulder and upper arm, unspecified arm, initial encounter: Secondary | ICD-10-CM | POA: Insufficient documentation

## 2010-03-29 DIAGNOSIS — Z23 Encounter for immunization: Secondary | ICD-10-CM

## 2010-04-05 NOTE — Assessment & Plan Note (Signed)
Summary: Dog scratch//fd   Vital Signs:  Patient profile:   55 year old female Weight:      233.6 pounds BMI:     32.70 Temp:     98.3 degrees F oral Pulse rate:   72 / minute Resp:     15 per minute BP sitting:   118 / 80  (left arm) Cuff size:   large  Vitals Entered By: Shonna Chock CMA (March 29, 2010 4:04 PM) CC: Dog scratch last night (sister's dog) Comments Side Note: Allergy list updated, patient indicated those were med she was NOT to take while on Study Drug which she is no longer taking. Allergies removed except eggs./Chrae [facility] CMA  March 29, 2010 4:11 PM    Primary Care Provider:  Neena Rhymes MD  CC:  Dog scratch last night (sister's dog).  History of Present Illness:     RUE scratched by dog's rear claws last night when it jumped across her. Super Glue was applied last night. She  reports redness, tenderness, and blood loss."It bled all night long"; she is on Plavix & ASA 81mg  once daily.  Pressure dressing applied @ 8:30  this am by School Nurse.  Allergies: 1)  ! * Eggs  Physical Exam  General:  well-nourished,in no acute distress; alert,appropriate and cooperative throughout examination Pulses:  R and Lradial  pulses are full and equal bilaterally Skin:  33 mm closed  laceration with 4X4 mm abrasion  oozing blood superiorly R forearm. Associated  mild ecchymosis    Impression & Recommendations:  Problem # 1:  ARM INJURY (ICD-959.2) laceration   Complete Medication List: 1)  Nitrostat 0.4 Mg Subl (Nitroglycerin) .... As needed 2)  Simvastatin 20 Mg Tabs (Simvastatin) .[name].. 1 by mouth at bedtime- 3)  Bayer Low Strength 81 Mg Tbec (Aspirin) .[name].. 1 by mouth once daily 4)  Plavix 75 Mg Tabs (Clopidogrel bisulfate) .[name].. 1 by mouth once daily 5)  Depakote 500 Mg Tbec (Divalproex sodium) .... 3 by mouth once daily 6)  Clonazepam 0.5 Mg Tabs (Clonazepam) .... As needed 7)  Trazodone Hcl 50 Mg Tabs (Trazodone hcl) .[name].. 1 by mouth as needed 8)  Abilify 10  Mg Tabs (Aripiprazole) .... 1/2 by mouth once daily 9)  Multivitamins Tabs (Multiple vitamin) .... Daily 10)  Maxair Autohaler 200 Mcg/inh Aerb (Pirbuterol acetate) .... As needed 11)  Maxzide-25 37.5-25 Mg Tabs (Triamterene-hctz) .... One po daily 12)  Omnaris 50 Mcg/act Susp (Ciclesonide) .... 2 sprays each nostril once daily 13)  Vicodin 5-500 Mg Tabs (Hydrocodone-acetaminophen) .[name].. 1-2 tabs by mouth q6 as needed for pain 14)  Cyclobenzaprine Hcl 10 Mg Tabs (Cyclobenzaprine hcl) .[name].. 1 by mouth 2 times daily as needed for back/neck pain.  will cause drowsiness 15)  Naprosyn 500 Mg Tabs (Naproxen) .[name].. 1 two times a day x7-10 days and then as needed. 16)  Triamcinolone Acetonide 0.1 % Oint (Triamcinolone acetonide) .... Apply to affected areas two times a day.  disp 1 tube 17)  Bactroban 2 % Crea (Mupirocin calcium) .... Apply once daily after cleansing  Other Orders: Tdap => 79yrs IM (40981) Admin 1st Vaccine (19147)  Patient Instructions: 1)  Report pain , pus , fever & red streaks . Apply antibiotic cream if redness or pus present. Telfa dressing changed daily. Prescriptions: BACTROBAN 2 % CREA (MUPIROCIN CALCIUM) apply once daily after cleansing  #15 grams x 0   Entered and Authorized by:   Marga Melnick MD   Signed  by:   Marga Melnick MD on 03/29/2010   Method used:   Print then Give to Patient   RxID:   [id number]    Orders Added: 1)  Tdap => 88yrs IM [90715] 2)  Admin 1st Vaccine [90471] 3)  Est. Patient Level II [19379]   Immunizations Administered:  Tetanus Vaccine:    Vaccine Type: Tdap    Site: right deltoid    Mfr: GlaxoSmithKline    Dose: 0.5 ml    Route: IM    Given by: Shonna Chock CMA    Exp. Date: 11/12/2011    Lot #: [id number]    VIS given: 12/11/07 version given March 29, 2010.   Immunizations Administered:  Tetanus Vaccine:    Vaccine Type: Tdap    Site: right deltoid    Mfr: GlaxoSmithKline    Dose: 0.5 ml    Route: IM    Given by:  Shonna Chock CMA    Exp. Date: 11/12/2011    Lot #: [id number]    VIS given: 12/11/07 version given March 29, 2010.

## 2010-04-13 ENCOUNTER — Other Ambulatory Visit: Payer: Self-pay | Admitting: Cardiology

## 2010-04-15 ENCOUNTER — Telehealth: Payer: Self-pay | Admitting: Family Medicine

## 2010-04-15 NOTE — Telephone Encounter (Signed)
Discuss with patient.Kathleen [name] CMA    

## 2010-04-15 NOTE — Telephone Encounter (Signed)
Shingles do not reoccur in response to chicken pox exposure.  They usually appear in times of great physical (illness) or emotional stress.  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

## 2010-04-15 NOTE — Telephone Encounter (Signed)
Please advise Felecia Raymie Trani CMA   

## 2010-04-15 NOTE — Telephone Encounter (Signed)
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

## 2010-04-23 ENCOUNTER — Other Ambulatory Visit: Payer: Self-pay | Admitting: *Deleted

## 2010-04-23 MED ORDER — TRIAMTERENE-HCTZ 37.5-25 MG PO TABS: 1.0000 | ORAL_TABLET | Freq: Every day | ORAL | Status: DC

## 2010-04-30 LAB — CBC
HCT: 45.9 % (ref 36.0–46.0)
Hemoglobin: 15.9 g/dL — ABNORMAL HIGH (ref 12.0–15.0)
MCHC: 34.6 g/dL (ref 30.0–36.0)
RBC: 5.1 MIL/uL (ref 3.87–5.11)
RDW: 13.1 % (ref 11.5–15.5)

## 2010-04-30 LAB — COMPREHENSIVE METABOLIC PANEL
ALT: 27 U/L (ref 0–35)
Alkaline Phosphatase: 70 U/L (ref 39–117)
BUN: 21 mg/dL (ref 6–23)
CO2: 27 mEq/L (ref 19–32)
GFR calc non Af Amer: 47 mL/min — ABNORMAL LOW (ref >60)
Glucose, Bld: 95 mg/dL (ref 70–99)
Potassium: 3.9 mEq/L (ref 3.5–5.1)
Sodium: 142 mEq/L (ref 135–145)
Total Protein: 7.2 g/dL (ref 6.0–8.3)

## 2010-04-30 LAB — PREGNANCY, URINE: Preg Test, Ur: NEGATIVE

## 2010-05-03 LAB — CBC
HCT: 38.4 % (ref 36.0–46.0)
Hemoglobin: 13.2 g/dL (ref 12.0–15.0)
MCHC: 34.4 g/dL (ref 30.0–36.0)
RBC: 4.15 MIL/uL (ref 3.87–5.11)
RDW: 13.1 % (ref 11.5–15.5)

## 2010-05-03 LAB — T3, FREE: T3, Free: 3 pg/mL (ref 2.3–4.2)

## 2010-05-03 LAB — COMPREHENSIVE METABOLIC PANEL
Alkaline Phosphatase: 65 U/L (ref 39–117)
BUN: 21 mg/dL (ref 6–23)
CO2: 29 mEq/L (ref 19–32)
Calcium: 9.8 mg/dL (ref 8.4–10.5)
GFR calc non Af Amer: 49 mL/min — ABNORMAL LOW (ref >60)
Glucose, Bld: 80 mg/dL (ref 70–99)
Total Protein: 6 g/dL (ref 6.0–8.3)

## 2010-05-03 LAB — DRUGS OF ABUSE SCREEN W/O ALC, ROUTINE URINE
Barbiturate Quant, Ur: NEGATIVE
Benzodiazepines.: NEGATIVE
Cocaine Metabolites: NEGATIVE
Creatinine,U: 68.1 mg/dL
Marijuana Metabolite: NEGATIVE
Methadone: NEGATIVE
Opiate Screen, Urine: NEGATIVE
Phencyclidine (PCP): NEGATIVE
Propoxyphene: NEGATIVE

## 2010-05-03 LAB — URINALYSIS, MICROSCOPIC ONLY
Ketones, ur: NEGATIVE mg/dL
Nitrite: NEGATIVE
Protein, ur: NEGATIVE mg/dL
Urobilinogen, UA: 0.2 mg/dL (ref 0.0–1.0)

## 2010-05-03 LAB — T4, FREE: Free T4: 0.94 ng/dL (ref 0.80–1.80)

## 2010-05-03 LAB — LITHIUM LEVEL: Lithium Lvl: 1.47 mEq/L — ABNORMAL HIGH (ref 0.80–1.40)

## 2010-05-03 LAB — TSH: TSH: 8.645 u[IU]/mL — ABNORMAL HIGH (ref 0.350–4.500)

## 2010-05-03 LAB — MAGNESIUM: Magnesium: 2.2 mg/dL (ref 1.5–2.5)

## 2010-05-04 LAB — CBC
HCT: 37.9 % (ref 36.0–46.0)
Hemoglobin: 13.4 g/dL (ref 12.0–15.0)
RBC: 4.16 MIL/uL (ref 3.87–5.11)
RDW: 13.5 % (ref 11.5–15.5)
WBC: 5.8 10*3/uL (ref 4.0–10.5)

## 2010-05-04 LAB — COMPREHENSIVE METABOLIC PANEL
ALT: 44 U/L — ABNORMAL HIGH (ref 0–35)
Alkaline Phosphatase: 71 U/L (ref 39–117)
BUN: 15 mg/dL (ref 6–23)
CO2: 29 mEq/L (ref 19–32)
Chloride: 103 mEq/L (ref 96–112)
GFR calc non Af Amer: >60 mL/min (ref >60)
Glucose, Bld: 96 mg/dL (ref 70–99)
Potassium: 4.1 mEq/L (ref 3.5–5.1)
Sodium: 137 mEq/L (ref 135–145)
Total Bilirubin: 0.6 mg/dL (ref 0.3–1.2)
Total Protein: 6.4 g/dL (ref 6.0–8.3)

## 2010-05-04 LAB — URINALYSIS, ROUTINE W REFLEX MICROSCOPIC
Bilirubin Urine: NEGATIVE
Glucose, UA: NEGATIVE mg/dL
Ketones, ur: NEGATIVE mg/dL
Leukocytes, UA: NEGATIVE
Protein, ur: NEGATIVE mg/dL
pH: 7.5 (ref 5.0–8.0)

## 2010-05-04 LAB — BRAIN NATRIURETIC PEPTIDE: Pro B Natriuretic peptide (BNP): 86 pg/mL (ref 0.0–100.0)

## 2010-05-04 LAB — DIFFERENTIAL
Basophils Absolute: 0 10*3/uL (ref 0.0–0.1)
Basophils Relative: 0 % (ref 0–1)
Eosinophils Absolute: 0.1 10*3/uL (ref 0.0–0.7)
Monocytes Relative: 6 % (ref 3–12)
Neutro Abs: 4.5 10*3/uL (ref 1.7–7.7)
Neutrophils Relative %: 78 % — ABNORMAL HIGH (ref 43–77)

## 2010-05-04 LAB — URINE MICROSCOPIC-ADD ON

## 2010-05-04 LAB — LIPID PANEL: Triglycerides: 132 mg/dL (ref <150)

## 2010-05-04 LAB — POCT CARDIAC MARKERS
CKMB, poc: <1 ng/mL — ABNORMAL LOW (ref 1.0–8.0)
Myoglobin, poc: 57 ng/mL (ref 12–200)
Myoglobin, poc: 64.5 ng/mL (ref 12–200)

## 2010-05-04 LAB — APTT: aPTT: 25 seconds (ref 24–37)

## 2010-05-04 LAB — MAGNESIUM: Magnesium: 2.2 mg/dL (ref 1.5–2.5)

## 2010-05-04 LAB — CARDIAC PANEL(CRET KIN+CKTOT+MB+TROPI)
CK, MB: 0.9 ng/mL (ref 0.3–4.0)
Relative Index: INVALID (ref 0.0–2.5)
Total CK: 49 U/L (ref 7–177)

## 2010-05-04 LAB — PROTIME-INR: INR: 0.9 (ref 0.00–1.49)

## 2010-05-04 LAB — TROPONIN I: Troponin I: <0.01 ng/mL (ref 0.00–0.06)

## 2010-05-04 LAB — LITHIUM LEVEL: Lithium Lvl: 1.12 mEq/L (ref 0.80–1.40)

## 2010-06-02 ENCOUNTER — Encounter: Payer: Self-pay | Admitting: Family Medicine

## 2010-06-07 NOTE — H&P (Signed)
NAMEMarland [name]  [name], [name]                 ACCOUNT NO.:  [id number]   MEDICAL RECORD NO.:  [id number]          PATIENT TYPE:  IPS   LOCATION:  0504                          [facility]ILITY:  [facility]   PHYSICIAN:  Geoffery Lyons, M.D.      DATE OF BIRTH:  1955/03/12   DATE OF ADMISSION:  06/03/2008  DATE OF DISCHARGE:                       PSYCHIATRIC ADMISSION ASSESSMENT   A 55 year old female, voluntarily admitted on 06/03/2008.   HISTORY OF PRESENT ILLNESS:  The patient states that she is here because  she has been more obsessed with suicide.  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  She states that she went to the school around 3  o'clock in the morning and was able to get in with a file and started  crying and had gathered her things and left.  She told her therapist  about her plan and had recommended admission for suicidal ideation.  She  has not been sleeping well, sleeping only three hours at night.  She  reports a change in her appetite.  Reported losing approximately 15  pounds, she states from the lithium because of a metallic taste.  Has  been hearing sounds at night recently.  She denies any substance use and  reports compliance with her medications.  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   PAST PSYCHIATRIC HISTORY:  First admission to the [facility].  Sees Dr. Nolen Mu and Dr. Andrey Campanile for therapy.   SOCIAL HISTORY:  She is a 55 year old divorced female.  She has a  Education administrator in education.  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   FAMILY HISTORY:  Father with Alzheimer's.   SUBSTANCE HISTORY:  She is a nonsmoker.  Denies any alcohol or drug use.   PRIMARY CARE PHYSICIAN:  Dr. Antoine Poche at  [facility] who is a cardiologist.   MEDICAL PROBLEMS:  She reports a history of an MI last year, recently  had chest pain and had a workup with no findings of an MI.  Has a  history of asthma.   MEDICATIONS:  Plavix 75 mg daily, lithium 3 mg one in the morning and  two at bedtime, that has been since January 2010, Depakote ER taking  four at bedtime, Abilify 10 mg in the morning, bisoprolol 2.5 mg daily.  She was also recently prescribed propranolol for tremor.   DRUG ALLERGIES:  LAMICTAL WITH A HISTORY OF RASH.   REVIEW OF SYSTEMS:  Positive for insomnia, positive for changes in  appetite a 15 pound weight loss, positive for chest pain, no shortness  of breath.  No nausea, vomiting or diarrhea.  No dysuria.  Positive for  tremors, positive for dizziness.  No falls.  No seizures.  No joint  pain.  Positive for depression.   PHYSICAL EXAMINATION:  VITAL SIGNS:  Temperature of 97.9, heart rate 59,  respirations 18, blood pressure 129/77.  GENERAL:  A middle-aged female who is overweight.  No acute distress.  HEENT:  Nose and throat are atraumatic.  EOMs are intact.  NECK:  Negative lymphadenopathy.  CHEST:  Clear, no wheezes, no rales.  BREASTS:  Deferred.  HEART:  Regular rate and rhythm.  No murmurs or gallops are auscultated.  ABDOMEN:  Soft, nondistended, nontender with positive bowel sounds.  PELVIC/GU:  Deferred.  EXTREMITIES:  Moves all extremities.  No clubbing or edema, 5+ against  resistance.  SKIN:  There is some scattered bruising to her arms and legs.  Otherwise  skin is fair but without rash or lacerations.  NEUROLOGIC:  Findings are  intact.  Cranial nerves II-XII are intact.   LABORATORY DATA:  Lithium level 1.47, magnesium of 2.2,  CMET within  normal limits.  CBC within normal limits and a Depakote level is  pending.   MENTAL STATUS EXAM:  The patient at this time is resting in bed.  She is  very cooperative with good eye contact, casually dressed.  Her speech  is  clear, normal pace and tone.  The patient's mood is depressed.  The  patient smiles and is asking to leave.  She does appear mildly anxious  and does not appear to be responding to internal stimuli.  Thought  processes are endorsing suicidal thoughts.  No specific plan and  contracts for safety in the unit.  No delusional statements made.  She  is coherent and organized in her thought process, and cognitive function  intact.  Her memory appears intact.  Judgment and insight are fair.   DIAGNOSES:  AXIS I:  Major depressive disorder.  AXIS II:  Deferred.  AXIS III:  History of coronary artery disease.  AXIS IV:  Problems with occupation and other psychosocial problems.  AXIS V:  Current is 35.   PLAN:  Contract for safety.  Stabilize mood and thinking.  We will  discontinue her lithium due to chronic elevated lithium level and  tremor.  We will consider increasing her Abilify.  We also discontinued  her propranolol.  Obtain a TSH.  We will also contact Dr. Nolen Mu for  further insight and recommendations. The patient is to continue with her  ongoing therapy.  Will continue to identify her support group and  encourage the patient to attend groups.  Intended length of stay at this  time is 3-5 days.      [name], N.P.      Geoffery Lyons, M.D.  Electronically Signed    JO/MEDQ  D:  06/04/2008  T:  06/04/2008  Job:  [id number]

## 2010-06-07 NOTE — Assessment & Plan Note (Signed)
[facility]                            CARDIOLOGY OFFICE NOTE   ARADIA, ESTEY                        MRN:          [id number]  DATE:04/11/2007                            DOB:          1955-11-29    REASON FOR PRESENTATION:  Evaluate patient with coronary disease and a  recent syncopal episode.   HISTORY OF PRESENT ILLNESS:  The patient is a pleasant 55 year old with  non-Q-wave myocardial infarction.  She had anatomy as described below.  Since I last saw her, she has a headache.  She was taken off of Imdur.  She subsequently had some lightheadedness.  She had a syncopal episode.  This happened after walking.  She was doing some walking for exercise at  school.  She did 30 minutes.  When she stopped, she felt gray and things  went black.  She had a short syncopal episode.  She had a similar  feeling at cardiac rehab with exercise and her blood pressure dropped.  She has had some twinges of chest discomfort, but none of the chest  discomfort that prompted her hospitalization.  With exercise, she is not  getting any chest pressure, neck or arm discomfort.  She is feeling  occasional palpitations.  Through a phone conversation with our PA, she  was put on a CardioNet monitor and has transmitted some premature atrial  contractions and PVCs which she feels.  However, she does not feel  presyncopal or syncopal with this.  She is not describing orthostatic  symptoms.  She has been very fatigued.  She works.  However, when she  comes home from work, she has to take a nap which is unusual for her.   PAST MEDICAL HISTORY:  1. 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  2. Hypertension.  3. Depression.  4. Superficial lower extremity venous thrombosis.  5. Tonsillectomy.  6. Dyslipidemia.   ALLERGIES:   NONE.   MEDICATIONS:  1. Plavix 75 mg daily.  2. Aspirin 81 mg daily.  3. Simvastatin 20 mg q.h.s.  4. Metoprolol 25 mg b.i.d.  5. Triamterene/hydrochlorothiazide.  6. Paroxetine.  7. Maxair.   REVIEW OF SYSTEMS:  As stated in the HPI, otherwise negative for other  systems.   PHYSICAL EXAMINATION:  GENERAL:  The patient is in no distress.  VITAL SIGNS:  Blood pressure 139/80, heart rate 88 and regular, no  orthostatic blood pressure drop.  HEENT:  Eyes are unremarkable.  Pupils are equal, round, reactive to  light.  Fundi not visualized.  Oral mucosa unremarkable.  NECK:  No jugular venous distention at 45 degrees, carotid upstroke  brisk and symmetric.  No bruits, no thyromegaly.  LYMPHATICS:  No cervical, axillary, inguinal adenopathy.  LUNGS:  Clear to auscultation bilaterally.  BACK:  No costovertebral angle tenderness.  CHEST:  Unremarkable.  HEART:  PMI not displaced or sustained, S1-S2 within normal limits, no  S3, no S4, no clicks, rubs, murmurs.  ABDOMEN:  Obese, positive  bowel sounds normal in frequency and pitch.  No bruits, rebound, guarding or midline pulsatile mass.  No hepatomegaly  or splenomegaly.  SKIN:  No rashes, no nodules.  EXTREMITIES:  Pulses are 2+ throughout, no edema.  Chronic lower  extremity venostasis changes.  No cyanosis, no clubbing.  NEURO:  Oriented to person, place and time.  Cranial nerves II-XII are  grossly intact.  Motor grossly intact throughout.   DIAGNOSTICS:  EKG:  Sinus rhythm, rate 80, axis within normal limits,  intervals within normal limits, no acute ST/T wave changes.   ASSESSMENT/PLAN:  1. Syncope.  The patient had a syncopal episode after exercising.      This may have been vagal.  She does not have any orthostatic blood      pressure drop.  She has not so far been found to have any      significant dysrhythmia.  She has had some documented low blood      pressures.  At this point given the fatigue and this event, I  think      it is prudent to stop the beta blocker altogether.  She will let me      know if her symptoms slowly resolve or get worse.  She will      continue wearing the CardioNet monitor for the full 3 weeks.  2. Coronary disease.  The patient had occlusion of the septal      perforator, but no other disease.  She will remain on the Plavix      and aspirin.  3. Previous hypertension.  If her blood pressure is low going forward      (it was not today) we might need to get rid of triamterine and      hydrochlorothiazide.  4. Anxiety/depression per Dr. Francesca Jewett.   FOLLOW UP:  I will see the patient again in 1 month or sooner if she has  any symptoms or findings on her CardioNet.     Rollene Rotunda, MD, [facility]  Electronically Signed    JH/MedQ  DD: 04/11/2007  DT: 04/11/2007  Job #: [id number]   cc:   Lise Auer

## 2010-06-07 NOTE — Assessment & Plan Note (Signed)
[facility]                                 ON-CALL NOTE   [name], [name]                          MRN:          [id number]  DATE:03/29/2007                            DOB:          08/28/1955    PRIMARY CARDIOLOGIST:  Dr. Rollene Rotunda.   TELEPHONE CONTACT:  March 29, 2007   I was contacted by Byrd Hesselbach from cardiac rehab regarding Kathleen [name].  Ms.  Martire had a non-ST-segment elevation MI and a cardiac catheterization on  February 11 showed a subtotally occluded second perforating branch of  the LAD.  Suspect spontaneous dissection.  Medical therapy was  recommended.  Since leaving the hospital she has had several episodes of  chest pain including one today while driving and another one while at  cardiac rehab.  I spoke with Ms. Win and she is understandably  concerned.  I reviewed the results of the cardiac catheterization with  her and reassured her that other than the vessel which is too small to  fix she does not have significant coronary artery disease.  Because of  her chest pain which is consistent with angina I have called in Imdur 30  mg a day.  She also complained of some palpitations and had some PVCs on  telemetry during cardiac rehab, but nothing else has been seen so far.  Her EF is 60%.  I will also call the office and arrange for her to have  a CardioNet monitor and then follow up with Dr. Antoine Poche.      Theodore Demark, PA-C  Electronically Signed      Pricilla Riffle, MD, [facility]  Electronically Signed   RB/MedQ  DD: 03/29/2007  DT: 03/31/2007  Job #: [id number]
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
## 2010-06-07 NOTE — Discharge Summary (Signed)
NAME:  Kathleen [name], Kathleen [name]                 ACCOUNT NO.:  [id number]   MEDICAL RECORD NO.:  [id number]          PATIENT TYPE:  OBV   LOCATION:  3739                         FACILITY:  [facility]   PHYSICIAN:  Rollene Rotunda, MD, FACCDATE OF BIRTH:  1955/04/05   DATE OF ADMISSION:  05/21/2008  DATE OF DISCHARGE:  05/22/2008                               DISCHARGE SUMMARY   PRIMARY CARDIOLOGIST:  Rollene Rotunda, MD, [facility].   PRIMARY CARE PHYSICIAN:  Lise Auer.   DISCHARGING DIAGNOSES:  1. Chest pain, atypical.  The patient ruled out for myocardial      infarction by serial enzymes.  Also ruled out for pulmonary emboli      by CT angiogram.  2. Hematuria.  The patient without fever, chills, or pyuria, has a      followup appointment with Gynecology in a few days, encouraged the      patient to keep that appointment.  Urinalysis obtained this      admission negative.   PAST MEDICAL HISTORY:  1. Coronary artery disease, status post non-Q-wave MI in February      2009.  The patient with a septal perforator occlusion underwent      cardiac catheterization that revealed left main normal, septal      occluded, otherwise LAD was normal, left circumflex normal, right      coronary artery normal.  The patient had a followup cardiac      catheterization in April 2009 with no changes.  2. Hypertension.  3. Bipolar disorder.  4. Atrial arrhythmias.  5. Superficial left extremity thrombus by history.  Note, the patient      had bilateral lower venous Dopplers this admission.  Preliminary      findings showed no obvious evidence of DVTs.  6. Dyslipidemia.  7. Tracer drug study participant.   Kathleen [name] is a 55 year old Caucasian female with past medical history as  stated above who presented with complaints of chest discomfort,  shortness of breath.  She also described increased weakness and nausea.  Chest discomfort described as a prodding and poking sensation in her  left chest that was nonradiating.   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  The patient was driven to the emergency room by  friend.  She had no further chest discomfort after being evaluated in  the emergency room.  The patient states compliance with medications.  Blood pressure in the ER was 102/63, BNP 86.  Cardiac enzymes negative.  Chest x-ray was unremarkable.  EKG normal sinus rhythm, creatinine 0.9.  The patient underwent a CT angiogram and bilateral Doppler studies, both  unremarkable.  Dr. Antoine Poche in to see the  patient on day of discharge.  No further episodes of chest discomfort,  no shortness of breath, blood pressure stable.  Note, the patient's D-  dimer was mildly elevated at 0.81, but CT angiogram negative.  The  patient was discharged home.  Followup with GYN regarding hematuria.  Length of discharge summary encounter  less than 30 minutes.      Dorian Pod, ACNP      Rollene Rotunda, MD, [facility]  Electronically Signed    MB/MEDQ  D:  05/22/2008  T:  05/23/2008  Job:  [id number]   cc:   Lise Auer

## 2010-06-07 NOTE — Assessment & Plan Note (Signed)
[facility]                            CARDIOLOGY OFFICE NOTE   [name], Kathleen                        MRN:          [id number]  DATE:03/20/2007                            DOB:          Dec 13, 1955    PRIMARY CARE PHYSICIAN:  Dr. Lise Auer.   REASON FOR PRESENTATION:  Evaluate patient with non-Q-wave myocardial  infarction.   HISTORY OF PRESENT ILLNESS:  The patient presented on February 10 with  chest discomfort.  She did have elevated enzymes in the ER.  She had a  negative CT for any evidence of pulmonary embolism.  She eventually went  to cardiac catheterization. She was found to have normal vessels other  than a second septal perforator which was occluded. The EF was 60% with  no significant regional wall motion abnormalities.  Dr. Excell Seltzer, who did  this, suspected spontaneous dissection.  She is managed with aspirin and  Plavix.   Since going home, she has had no further pain. She denies any chest  discomfort, neck or arm discomfort.  She has had no shortness of breath,  PND or orthopnea.  She has had no palpitations, presyncope or syncope.   PAST MEDICAL HISTORY:  Hypertension, depression, superficial lower  extremity venous thrombosis, tonsillectomy, dyslipidemia (her  cholesterol is 193, triglycerides 241, HDL 50 and LDL 95).   ALLERGIES:  None.   MEDICATIONS:  1. Simvastatin 20 mg q.h.s.  2. Metoprolol 25 mg b.i.d.  3. triamterene/hydrochlorothiazide 37.5/25.  4. Paroxetine 30 mg daily.  5. Aspirin 81 mg daily.  6. Prempro.  7. Maxair.  8. Plavix 75 mg daily.   REVIEW OF SYSTEMS:  As stated in the HPI and otherwise negative for  other systems.   PHYSICAL EXAMINATION:  The patient is in no distress.  Blood pressure 119/77, heart rate 70 and regular, weight 283 pounds,  body mass index 38.  HEENT:  Eyelids unremarkable, pupils equal, round and reactive to light.  Fundi not visualized, oral mucosa unremarkable.  NECK:  No  jugular distention at 45 degrees. Carotid upstroke brisk and  symmetric, no bruits, no thyromegaly.  LYMPHATICS:  No cervical, axillary, or inguinal adenopathy.  LUNGS:  Clear to auscultation bilaterally.  BACK:  No costovertebral angle tenderness.  CHEST:  Unremarkable.  HEART:  PMI not displaced or sustained, S1 and S2 within normal. No S3,  no S4, no clicks, no rubs, no murmurs.  ABDOMEN:  Obese, positive bowel sounds, normal in frequency and pitch,  no bruits, no rebound, no guarding, no midline pulsatile mass, no  hepatomegaly, no splenomegaly.  SKIN:  No rashes, no nodules.  EXTREMITIES:  2+ pulses throughout, no edema, no cyanosis, no clubbing.  NEURO:  Oriented to person, place and time. Cranial nerves II-XII  grossly intact, motor grossly intact.   EKG:  Sinus rhythm, rate 71, axis within normal limits, intervals within  normal limits, nonspecific T-wave changes in V3 with a biphasic T-wave.   ASSESSMENT/PLAN:  1. Coronary artery disease.  The patient has coronary artery disease      as described below.  This was  an unusual presentation.  At this      point, she will remain on the Plavix and aspirin.  I have suggested      that she reconsider the Prempro as we cannot absolutely exclude an      embolic or thrombotic event.  She is going to discuss this with her      primary care doctor.  I did call Dr. Francesca Jewett today to discuss this as      well and she will se him in follow-up.  2. Obesity.  She understands she needs to lose weight with diet and      exercise.  3. Dyslipidemia.  This is primarily hypertriglyceridemia.  She is on      the medications as listed and should also have this followed with      diet.  4. Depression per Dr. Francesca Jewett.  5. Follow-up will. I will see the patient back in 6 months or sooner      if needed.     Rollene Rotunda, MD, [facility]  Electronically Signed    JH/MedQ  DD: 03/20/2007  DT: 03/21/2007  Job #: [id number]   cc:   Lise Auer

## 2010-06-07 NOTE — Discharge Summary (Signed)
NAMEDYAMON, SOSINSKI                 ACCOUNT NO.:  [id number]   MEDICAL RECORD NO.:  [id number]          PATIENT TYPE:  OBV   LOCATION:  2003                         FACILITY:  [facility]   PHYSICIAN:  [name]. Eden Emms, MD, FACCDATE OF BIRTH:  04-Mar-1955   DATE OF ADMISSION:  05/16/2007  DATE OF DISCHARGE:  05/17/2007                               DISCHARGE SUMMARY   PROCEDURES:  1. Cardiac catheterization.  2. Coronary arteriogram.  3. Left ventriculogram.   PRIMARY FINAL DISCHARGE DIAGNOSIS:  Chest pain.   SECONDARY DIAGNOSES:  1. Non-ST segment elevation myocardial infarction in February 2009      secondary to subtotally occluded septal perforator, possible      spontaneous dissection, and medical therapy recommended.  2. Syncope felt to be vasodilatory, resolved with decreased beta-      blocker.  3. Hypertension.  4. Obesity.  5. Dyslipidemia/hypertriglyceridemia.  6. Depression.   TIME AT DISCHARGE:  22 minutes.   HOSPITAL COURSE:  [name] is a 55 year old female with a history of  coronary artery disease.  She had sudden onset of substernal chest pain  at rest and took nitroglycerin, which helped the pain.  She came to the  emergency room and was admitted for further evaluation.   Her cardiac enzymes were negative for MI.  A repeat catheterization  showed no change in her anatomy.  Previously, subtotally occluded septal  perforator was opened.  Her EF was 60%.   Post cath, [name] had no chest pain or shortness of breath.  Her  groin was without ecchymosis, bruit, or hematoma.  She was considered  stable for discharge on May 17, 2007, with outpatient followup  arranged.   DISCHARGE INSTRUCTIONS:  1. Her activity levels to be increased gradually with no driving for 2      days.  2. She is to stick to a heart-healthy diet.  3. She is to follow up with Dr. Antoine Poche on Jun 03, 2007, at 1:30.  4. She is to follow up with Dr. Francesca Jewett in [place] as needed.   DISCHARGE  MEDICATIONS:  1. Pristiq 50 mg daily.  2. Aspirin 81 mg daily.  3. Statin medication daily.  4. Plavix 75 mg daily.  5. Triamterene/HCTZ 37.5/25 mg daily.  6. Bisoprolol 2.5 mg daily.  7. Simvastatin 20 mg daily.      Theodore Demark, PA-C      [name]. Eden Emms, MD, [facility]  Electronically Signed    RB/MEDQ  D:  06/06/2007  T:  06/07/2007  Job:  [id number]   cc:   Lise Auer

## 2010-06-07 NOTE — Assessment & Plan Note (Signed)
[facility]                                 ON-CALL NOTE   [name], Kathleen [name]                        MRN:          [id number]  DATE:04/03/2007                            DOB:          August 02, 1955    PRIMARY CARDIOLOGIST:  Rollene Rotunda, MD, [facility]   I was contacted by cardiac rehab regarding the patient.  Since our last  conversation she had gotten the prescription for IMDUR 30 mg a day and  tried taking them, but said they gave her a splitting headache and she  was unable to tolerate it.  She quit taking them several days ago, but  has had more episodes of chest pain since then all successfully treated  with nitroglycerin.  She has also had palpitations and has not yet  received a CardioNet monitor.   I discussed the situation with the patient.  She is going to wait until  the weekend and start the IMDUR back at 1/2 tablet daily.  She will  continue to use sublingual nitroglycerin as necessary for episodes of  chest pain.  I have called the office and a follow-up appointment is  scheduled for her next week.  The office stated that they would call her  with this appointment.  They will also follow up on the CardioNet  monitor which was ordered on March 6, but has not yet been received and  make sure that this is sent on through.  The patient was in agreement  with this as the plan of care.  If the IMDUR does not work, she may  possibly need Norvasc.  Also the patient commented that her blood  pressure was low at times with a systolic in the 90's after exercise.  I  will not currently make any other medication adjustments, but she is to  keep track of this and let us know how she is doing next week in the  office.      Theodore Demark, PA-C  Electronically Signed      Bevelyn Buckles. Bensimhon, MD  Electronically Signed   RB/MedQ  DD: 04/03/2007  DT: 04/04/2007  Job #: [id number]

## 2010-06-07 NOTE — H&P (Signed)
NAMEMarland [name]  JO, BOOZE                 ACCOUNT NO.:  [id number]   MEDICAL RECORD NO.:  [id number]          PATIENT TYPE:  INP   LOCATION:  2003                         FACILITY:  [facility]   PHYSICIAN:  Luis Abed, MD, FACCDATE OF BIRTH:  05-08-1955   DATE OF ADMISSION:  05/16/2007  DATE OF DISCHARGE:                              HISTORY & PHYSICAL   PRIMARY CARE PHYSICIAN:  Dr. Dennison Mascot, [place].   PRIMARY CARDIOLOGIST:  Rollene Rotunda, MD, [facility].   CHIEF COMPLAINT:  Chest pain.   HISTORY OF PRESENT ILLNESS:  Kathleen [name] is a 55 year old female with  known coronary artery disease.  Today, she was sitting at a desk when  she has had sudden onset of substernal chest pain that radiated to her  left scapular area.  It reached an 8/10.  It was associated with  shortness of breath, nausea, and weakness as well as diaphoresis.  The  symptoms began approximately 2:30 p.m.  She took 2 sublingual  nitroglycerin and decreased the pain from an 8/10 to a 2/10.  Because  her symptoms did not resolve, she came to the emergency room.  Currently  in the emergency room, she is still experiencing very mild pain but  feels much better.   Ms. Spillman had a non-ST-segment elevation myocardial infarction in  February 2009, and a cardiac catheterization showed a subtotally  excluded septal perforator.  This was felt to be possibly a spontaneous  dissection.  She had no other disease.  Her EF was 60% with no MR.  Medical therapy was recommended.  She had some episodes of chest pain at  cardiac rehab and was tried on Imdur, but did not tolerate it secondary  to side effects.  Dr. Antoine Poche adjusted her medications, and she has not  had chest pain in the last month.  She has been exercising vigorously at  cardiac rehab without symptoms.  Today is the first day that she has had  chest pain in over a month, and she states it is exactly like her pre-  cath symptoms.   PAST MEDICAL HISTORY:  1.  Non-ST segment elevation MI in February 2009, with single-vessel      disease treated medically.  2. Syncope, felt to be vasodilatory and resolved with decreased beta      blockade.  3. Hypertension.  4. Obesity.  5. Dyslipidemia with hypertriglyceridemia.  6. Depression.   SURGICAL HISTORY:  She has status post tonsillectomy and cardiac  catheterization.   ALLERGIES:  NO KNOWN DRUG ALLERGIES.   CURRENT MEDICATIONS:  1. Bisoprolol 5 mg one-half tablet daily.  2. Triamterene/HCTZ 37.5/25 mg daily.  3. Plavix 75 mg daily.  4. Study medication daily.  5. Simvastatin 20 mg a day.  6. Aspirin 81 mg a day.  7. Pristiq 50 mg a day.   SOCIAL HISTORY:  She lives alone in [address] and works at [employer] as a Administrator, arts.  She has no history of alcohol, tobacco,  or drug abuse.   FAMILY HISTORY:  Her parents are both alive.  Her mother  is 74 and her  father is 33, and either one has a history of heart disease.  She also  has no siblings with any history of coronary artery disease.   REVIEW OF SYSTEMS:  She had diaphoresis today, but otherwise has not had  any recent fevers, chills, or sweats.  She had a headache with Imdur,  but this resolved once this medication was discontinued.  She has had no  rashes or lesions.  The chest pain is described above.  She has had no  further episodes of syncope.  She feels that her depression symptoms are  fairly well controlled on the Pristiq.  She gets occasional arthralgias.  She denies reflux, indigestion, hematemesis, hemoptysis, or melena.  A  full 14-point review of systems is otherwise negative.   PHYSICAL EXAMINATION:  VITAL SIGNS: Temperature is 98.4, blood pressure  129/75, pulse 76, respiratory rate 20, and O2 saturation 97% on room  air.  GENERAL: She is a well-developed, obese, white female and no acute  distress, despite still complaining of mild chest pain.  HEENT: Normal.  Neck: There is no lymphadenopathy,  thyromegaly or JVD noted.  She has a  soft bruit bilaterally, which is likely a radiation of a soft cardiac  murmur.  CV: Her heart is regular in rate and rhythm with an S1 and S2 and a soft  systolic murmur is noted.  Distal pulses are intact in all 4  extremities.  LUNGS: Clear to auscultation bilaterally.  SKIN: No rashes or lesions are noted.  ABDOMEN: Soft and nontender with active bowel sounds.  EXTREMITIES: There is no cyanosis, clubbing, or edema noted.  MUSCULOSKELETAL: There is no joint deformity, effusion, and no spine or  CVA tenderness.  NEUROLOGIC: She is alert and oriented.  Cranial nerves II through XII  are grossly intact.   RADIOLOGY DATA:  Chest x-ray and labs are pending at the time of  dictation.   EKG is sinus rhythm with no acute ischemic changes.  There is diffuse  early repolarization in multiple leads.   IMPRESSION:  Return of severe chest pain:  Cardiac catheterization on  March 06, 2007, revealed subtotally occluded 2-mm septal perforator  of the LAD.  Dr. Excell Seltzer felt this may have been from a spontaneous  dissection of this vessel.  It is possible that there has been a further  change in this vessel.  Because of this history of spontaneous  dissection, we have chosen not to use IV heparin.  She will be admitted  and monitored carefully on telemetry.  We  will cycle cardiac enzymes.  We will add IV nitroglycerin and p.r.n.  pain control medications.  Dr. Antoine Poche in Dr. Excell Seltzer will be asked to  reassess, but she is tentatively on for catheterization tomorrow.  Further evaluation and treatment will depend on her response to medical  therapy and review of her labs.      Theodore Demark, PA-C      Luis Abed, MD, [facility]  Electronically Signed    RB/MEDQ  D:  05/16/2007  T:  05/17/2007  Job:  [id number]   cc:   Rollene Rotunda, MD, [facility]  Lise Auer

## 2010-06-07 NOTE — Cardiovascular Report (Signed)
NAMELAKESHIA, Kathleen [name]                 ACCOUNT NO.:  [id number]   MEDICAL RECORD NO.:  [id number]          PATIENT TYPE:  INP   LOCATION:  6525                         FACILITY:  [facility]   PHYSICIAN:  Veverly Fells. Excell Seltzer, MD  DATE OF BIRTH:  Nov 13, 1955   DATE OF PROCEDURE:  03/06/2007  DATE OF DISCHARGE:                            CARDIAC CATHETERIZATION   PROCEDURE:  Left heart catheterization, selective coronary angiography,  left ventricular angiography, Angio-Seal of the right femoral artery.   INDICATIONS:  Ms. Kazlauskas is a 55 year old woman with no prior cardiac  history.  She developed acute onset of sharp chest pain yesterday,  associated with nausea.  She ultimately ruled in for a myocardial  infarction, with a troponin up to 7.  Her pain is now resolved, and she  was referred for cardiac catheterization.  She had a CT angiogram of the  chest performed to rule out pulmonary embolus or dissection, and those  entities both were ruled out.  Her only cardiac risk factor is  hypertension.   Risks and indications of the procedure were reviewed with the patient.  Informed consent was obtained.  The right groin was prepped, draped, and  anesthetized with 1% lidocaine.  Using the modified Seldinger technique,  a 6-French sheath was placed in the right femoral artery.  Standard 6-  French Judkins catheters were used for coronary angiography.  An angled  pigtail catheter was used for left ventriculography.  A pullback across  the aortic valve was recorded at the completion of the procedure.  An  Angio-Seal device was used to seal the femoral arteriotomy.  The patient  tolerated the procedure well and had no immediate complications.   FINDINGS:  Aortic pressure 127/72, with a mean of 97, left ventricular  pressure 132/10.   The left mainstem is angiographically normal.  It trifurcates into the  LAD, left circumflex, and a large intermediate branch.  There is no  significant stenosis of the  left main.   LAD:  The LAD is a large-caliber vessel that courses down and reaches  the midanterior wall.  The LAD supplies two diagonal branches.  There  are two septal perforating branches, both of which are large branches.  The second septal perforator is occluded.  There is minimal flow beyond  the area of subtotal occlusion.  This may represent a perforating  branch, or it could have been a twin LAD system.  It is located distally  in the vessel.  The remaining portions of the LAD are angiographically  normal.   The ramus intermedius is large-caliber vessel.  There is no significant  angiographic stenosis.   The left circumflex is moderate sized.  It supplies a moderate-sized  first OM.  The left circumflex has no significant angiographic stenosis.   Right coronary artery:  The right coronary artery is an extremely large  vessel.  It is widely patent throughout its course.  There are no  significant stenoses present.  It bifurcates into a PDA and  posterolateral branch.  The PL branch reaches the LV apex.  There is no  significant stenosis throughout the RCA or branch vessels.   Left ventricular function is normal.  The LVEF is estimated at 60%.  There is no mitral regurgitation.   ASSESSMENT:  1. 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  2. Normal right coronary artery and left circumflex.  3. Normal left ventricular function.   PLAN:  Ms. Tussey really does not have significant CAD, other than one  area of subtotal occlusion in the distal second perforator verses distal  twin LAD.  She has preserved LV function.  Angiographically, I suspect  this represents a spontaneous dissection, although it could be an  atherosclerotic lesion.  In any event, she should do well with medical  therapy.      Veverly Fells. Excell Seltzer, MD  Electronically Signed     MDC/MEDQ  D:  03/06/2007  T:  03/08/2007  Job:   [id number]

## 2010-06-07 NOTE — Assessment & Plan Note (Signed)
[facility]                            CARDIOLOGY OFFICE NOTE   Kathleen [name], Kathleen [name]                        MRN:          [id number]  DATE:10/24/2007                            DOB:          12-16-1955    PRIMARY CARE PHYSICIAN:  Dr. Marcelyn Bruins.   REASON FOR PRESENTATION:  Evaluate the patient with previous myocardial  infarction.   HISTORY OF PRESENT ILLNESS:  The patient presents for followup of the  above.  Since I last saw her, she has had no further chest pain.  She  has been exercising routinely.  She walks to school every morning before  it starts.  She walks outside twice a week and she goes to water  aerobics twice a week.  With this, she denies any chest discomfort,  neck, or arm discomfort.  She is not having any palpitation, presyncope  or syncope.  She was having a lot of depression, but this is being  better treated.  She said she was binge eating with the depression, but  now is at [employer] and losing weight steadily.  She has had no  palpitations, has had no obvious symptomatic atrial fibrillation which  was suggested by previous telemetry.   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   ALLERGIES:  None.   MEDICATIONS:  1. Simvastatin 20 mg daily.  2. Triamterene/hydrochlorothiazide 37.5/25 daily.  3. Aspirin 81 mg daily.  4. Maxair.  5. Plavix 75 mg daily.  6. Bisoprolol 2.5 mg daily.  7. Study drug (TRA-2P study.  This is the addition of a thrombin      receptor antagonist versus placebo and standard therapy post MI.      Her anticipated study date completion is October 17, 1999).   REVIEW OF SYSTEMS:  As stated in HPI and otherwise negative for all  other systems.   PHYSICAL EXAMINATION:  GENERAL:  The patient is pleasant, and in no  distress.  VITAL SIGNS:  Blood pressure 118/84, heart rate 64 and regular, body  mass index of greater 35, weight 285 pounds.  NECK:  No jugular distention at 45 degrees.  Carotid upstroke brisk and  symmetrical.  No bruits, no thyromegaly.  LUNGS:  Clear to auscultation bilaterally.  HEART:  PMI not displaced or sustained, S1 and S2 within normal limits.  No S3, no S4, no clicks, no rubs, no murmurs.  ABDOMEN:  Obese, positive  bowel sounds normal in frequency and pitch, no bruits, no rebound, no  guarding, no midline pulsatile mass, no organomegaly.  SKIN:  No rashes or nodules.  EXTREMITIES:  2+ pulses, no edema.   EKG sinus rhythm, rate 64, axis within normal limits, intervals within  normal limits, no acute ST-wave changes.   ASSESSMENT AND PLAN:  1. Coronary artery disease.  The patient is having no new symptoms.  I      would continue the Plavix unless she has some  contraindication to      it, going forward.  Otherwise she will continue with secondary risk      reduction and the meds as listed.  2. Hypertension.  Blood pressure is well-controlled.  She will      continue the meds as listed.  3. Dyslipidemia.  She had an excellent lipid profile and will be      followed by her primary care doctor.  4. Obesity.  I applaud her weight loss and work with [employer].      I have no further suggestions to offer.  5. Followup.  I will see back in 1 year or sooner if needed.     Rollene Rotunda, MD, [facility]  Electronically Signed    JH/MedQ  DD: 10/24/2007  DT: 10/24/2007  Job #: [id number]   cc:   Marcelyn Bruins, MD @ [facility] in [facility]

## 2010-06-07 NOTE — Assessment & Plan Note (Signed)
[facility]                            CARDIOLOGY OFFICE NOTE   DREW, HERMAN                        MRN:          [id number]  DATE:05/09/2007                            DOB:          [birth year]    PRIMARY CARE PHYSICIAN:  Lise Auer, MD.   REASON FOR PRESENTATION:  Evaluate patient with syncope.   HISTORY OF PRESENT ILLNESS:  The patient is a pleasant 55 year old with  coronary disease as described below.  She had an episode of syncope.  I  did put her on a 21-day event monitor.  This demonstrated symptomatic  premature ventricular and atrial contractions, but no sustained  symptomatic arrhythmias.  She had no pauses.  She has had recurrent  lightheaded episodes and one episode of syncope about 10 days ago.  This  was similar to that described in the April 11, 2007 note.  She is  exercising.  She may stop and then feel lightheaded.  Again on one  occasion, she had a frank syncopal episode.  She had been on bisoprolol  5 mg daily.  She actually reduced this on her own to 2.5 mg daily.  She  said since doing that, she has had no further episode.  She has been  exercising vigorously since then.  She feels the palpitations, but does  not think these are associated with any lightheaded or syncopal  episodes.  She has not been having any chest discomfort, neck or arm  discomfort.  She is having no shortness of breath.   PAST MEDICAL HISTORY:  1. Non-Q-wave myocardial infarction (second septal perforator      occlusion with questionable spontaneous dissection, but otherwise      normal coronaries.  Well-preserved ejection fraction).  2. Hypertension.  3. Depression.  4. Syncope.  5. Superficial lower extremity venous thrombosis.  6. Tonsillectomy.  7. Dyslipidemia.   ALLERGIES:  NONE.   CURRENT MEDICATIONS:  1. Simvastatin 20 mg q.h.s.  2. Triamterine/hydrochlorothiazide 37.5/25.  3. Paroxetine 30 mg daily.  4. Aspirin 81 mg daily.  5.  Maxair.  6. Plavix 75 mg daily.  7. Bisoprolol 2.5 mg daily.  8. TRA-2P study drug.  9. Pristiq 50 mg daily.   REVIEW OF SYSTEMS:  As stated in the HPI, otherwise negative for other  systems.   PHYSICAL EXAMINATION:  GENERAL:  The patient is in no distress.  VITAL SIGNS:  Blood pressure 121/85, heart rate 57 and regular, weight  281 pounds, body mass index 38.  HEENT:  Eyelids are unremarkable.  Pupils equal, round and reactive to  light.  Fundi not visualized.  Oral mucosa unremarkable.  NECK:  No jugular venous distention at 45 degrees.  Carotid upstroke  brisk and symmetric.  No bruits, no thyromegaly.  LYMPHATICS:  No adenopathy.  LUNGS:  Clear to auscultation bilaterally.  BACK:  No costovertebral angle tenderness.  CHEST:  Unremarkable.  HEART:  PMI not displaced or sustained, S1-S2 within normal limits.  No  S3-S4, clicks, rubs, murmurs.  ABDOMEN:  Obese with positive bowel sounds, normal in frequency and  pitch.  No bruits, rebound, guarding or midline pulsatile mass.  No  hepatomegaly.  No splenomegaly.  SKIN:  No rashes.  No nodules.  EXTREMITIES:  2+ pulses throughout.  No edema, cyanosis, clubbing.  NEURO:  Oriented to person, place and time.  Cranial nerves II-XII are  grossly intact.  Motor grossly intact.   ASSESSMENT/PLAN:  1. Syncope.  The patient's syncope seems to be vasodilatory      phenomenon.  It has been related to exercise.  It may have been      exacerbated by the higher dose of beta blocker.  At this point, I      agree with having reduced this.  She knows the symptoms preceding      her syncope and will practice avoidance.  She will let me know if      this continues to recur and will consider further evaluation or med      changes.  2. Coronary disease.  She had this single-vessel disease and is having      no further symptoms.  She is participating in cardiac rehab.  No      further cardiovascular testing is suggested.  She will remain on       the Plavix and aspirin as long as there is not a contraindication.  3. Obesity.  I applaud the patient's efforts at weight loss and      encouraged more of the same.  4. Dyslipidemia.  I will defer to her primary care doctor.  The goal      being LDL less than 100 and HDL greater than 50.  5. Hypotension.  We may need to even back off further on medications      if she continues to have a low blood pressure, particularly if she      loses weight.  I would suggest going back on      triamterine/hydrochlorothiazide as the next step if her blood      pressure is running low.  6.  Anxiety/depression per Dr. Francesca Jewett.   FOLLOW UP:  I will see back in about 6 months or sooner if she has any  issues.    Rollene Rotunda, MD, [facility]  Electronically Signed   JH/MedQ  DD: 05/09/2007  DT: 05/09/2007  Job #: [id number]   cc:   Lise Auer

## 2010-06-07 NOTE — H&P (Signed)
Kathleen [name], Kathleen [name]                 ACCOUNT NO.:  [id number]   MEDICAL RECORD NO.:  [id number]          PATIENT TYPE:  AMB   LOCATION:  SDS                           FACILITY:  [facility]   PHYSICIAN:  Duke Salvia. Marcelle Overlie, M.D.DATE OF BIRTH:  [birth year]   DATE OF ADMISSION:  09/01/2008  DATE OF DISCHARGE:  09/01/2008                              HISTORY & PHYSICAL   CHIEF COMPLAINT:  Perimenopausal abnormal bleeding.   HISTORY OF PRESENT ILLNESS:  A 55 year old G3, P3, the patient has had a  prior tubal who had evaluation last year that showed an endometrial  polyp, was actually scheduled for D and C, hysteroscopy.  She had some  chest pain that was evaluated, so the surgery was postponed.  In the  interim, she has been diagnosed with bipolar disease, is now currently  stable from that, but due to continued irregular bleeding, she underwent  followup in that she has shown persistence of the posterior endometrial  polyp.  She presents now for D and C, hysteroscopy.  This procedure  including risks related to bleeding, infection, other complications that  may require additional surgery such as perforation all reviewed with  her, which she understands and accepts.   ALLERGIES:  None.   CURRENT MEDICATIONS:  Simvastatin, baby aspirin daily, Maxzide, Plavix  which has been held 1 week preop, bisoprolol, Depakote, Abilify,  trazodone, and clonazepam p.r.n.   OBSTETRICAL HISTORY:  Three vaginal deliveries.   OPERATIVE HISTORY:  Tubal ligation, tonsillectomy, and breast biopsy.   REVIEW OF SYSTEMS:  Significant for headache, heart disease, asthma,  thyroid disease, and bipolar disorder.   FAMILY HISTORY:  Significant for migraine, epilepsy, thyroid disease,  tuberculosis, osteoporosis, diabetes, and breast cancer.   SOCIAL HISTORY:  She is divorced.  Denies drug or cigarette use.  Occasional alcohol use.   PHYSICAL EXAMINATION:  VITAL SIGNS:  Temperature 98.2, blood pressure  110/72.  HEENT: Unremarkable.  NECK:  Supple without masses.  LUNGS:  Clear.  CARDIOVASCULAR:  Regular rate and rhythm without murmurs, rubs or  gallops.  BREASTS:  Without masses.  ABDOMEN:  Soft, flat, nontender.  PELVIC:  Normal external genitalia.  Vaginal mucosa is clear.  Uterus is  normal in positional size.  Adnexa negative.  EXTREMITIES:  Unremarkable.  NEUROLOGIC:  Unremarkable.   IMPRESSION:  Endometrial polyp, abnormal uterine bleeding.   PLAN:  D and C, hysteroscopy.  Procedure and risks reviewed as above.      Richard M. Marcelle Overlie, M.D.  Electronically Signed     RMH/MEDQ  D:  08/20/2008  T:  08/21/2008  Job:  [id number]

## 2010-06-07 NOTE — Discharge Summary (Signed)
Kathleen [name], Kathleen [name]                 ACCOUNT NO.:  [id number]   MEDICAL RECORD NO.:  [id number]          PATIENT TYPE:  INP   LOCATION:  6525                         FACILITY:  [facility]   PHYSICIAN:  Rollene Rotunda, MD, FACCDATE OF BIRTH:  01/24/1956   DATE OF ADMISSION:  03/05/2007  DATE OF DISCHARGE:  03/07/2007                               DISCHARGE SUMMARY   PROCEDURES:  1. Cardiac catheterization.  2. Coronary arteriogram.  3. Left ventriculogram.   FINAL DISCHARGE DIAGNOSIS:  Non-ST segment elevation myocardial  infarction.   SECONDARY DIAGNOSES:  1. Hypertension.  2. Depression.  3. Superficial lower extremity vein thrombosis.  4. Obesity.  5. Dyslipidemia with a total cholesterol of 193, triglycerides 241,      HDL 50, LDL 95  6. Status post tonsillectomy.   TIME OF DISCHARGE:  Thirty-seven minutes.   HOSPITAL COURSE:  Kathleen [name] is a 55 year old female with no previous  history of coronary artery disease.  She had sustained left-sided chest  pain on the day of admission, and when the symptoms persisted for over  an hour and half, she finally called EMS.  In the emergency room, her  initial troponin was minimally elevated at 0.12.  She was admitted for  further evaluation.   She ruled in for non-ST segment elevation MI, with a peak CK-MB 392/54.4  and a peak troponin of 7.97.  She was taken to the cath lab on March 06, 2007, and she had a second perforated branch of the LAD occlusion,  which Dr. Excell Seltzer felt was possibly a spontaneous dissection.  Her LAD  was otherwise normal, and her RCA and circumflex had no disease.  Her EF  was 60% with no MR.  Dr. Excell Seltzer advised medical therapy for this lesion.   A lipid profile was checked with the results described above.  She has  been started on a statin and will follow up as an outpatient.  She has  been seen by cardiac rehab and is to do cardiac rehab as an outpatient,  as well.  A beta blocker, as well as aspirin, were  added to her  medication regimen, and she tolerated these well.  Nutritional  information was given for a heart healthy diet.  Of note, her D-dimer  was checked on admission, and was elevated at 0.76, so a CT angiogram of  the chest was done prior to her heart catheterization that showed no  pulmonary emboli.  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   On March 07, 2007, Kathleen [name] was ambulating without chest pain or  shortness of breath.  She was evaluated by Dr. Antoine Poche and considered  stable for discharge and will follow up as an outpatient.   DISCHARGE INSTRUCTIONS:  Her activity level is to be increased  gradually.  She is to call our office for any problems with the cath  site.  She is encouraged to stick to a low-fat, heart-healthy diet.  She  has a follow-up appoint  with Dr. Antoine Poche on February 25 at 2:15 and  will follow up with Dr. Francesca Jewett, as scheduled.   DISCHARGE MEDICATIONS:  1. Aspirin 325 mg daily.  2. Metoprolol 25 mg b.i.d.  3. Nitroglycerin sublingual p.r.n.  4. HCTZ 25 mg daily.  5. Paxil 30 mg a day.  6. Zocor 20 mg q. day, follow-up lipid and liver profile in 6 weeks.  7. Plavix 75 mg daily.  8. Prempro 0.625/2.5 mg q. day.      Theodore Demark, PA-C      Rollene Rotunda, MD, [facility]  Electronically Signed    RB/MEDQ  D:  03/07/2007  T:  03/08/2007  Job:  [id number]   cc:   Lise Auer

## 2010-06-07 NOTE — H&P (Signed)
Kathleen [name], Kathleen [name]                 ACCOUNT NO.:  [id number]   MEDICAL RECORD NO.:  [id number]          PATIENT TYPE:  INP   LOCATION:  6525                         FACILITY:  [facility]   PHYSICIAN:  Rollene Rotunda, MD, FACCDATE OF BIRTH:  May 19, 1955   DATE OF ADMISSION:  03/05/2007  DATE OF DISCHARGE:                              HISTORY & PHYSICAL   The primary is Dr. Asencion Gowda.  The cardiologist is none.   REASON FOR PRESENTATION:  Evaluate patient with chest pain.   HISTORY OF PRESENT ILLNESS:  The patient is a very pleasant 55 year old  white female without prior cardiac history.  Today, she developed a left-  sided sharp chest discomfort while hurrying to get ready for work and  after showering.  Discomfort was intense.  It was 8/10.  It radiated to  her neck and slightly down her left arm.  She felt somewhat short of  breath and dizzy with this.  She did go on to work.  However, the  symptoms persisted for about an hour and half.  She finally called EMS.  She was given sublingual nitroglycerin and actually improvement her  symptoms in about three minutes.  She had no diaphoresis.  There were no  palpitations.  She had no frank syncope.   In the emergency room, her D-dimer is 0.76 and first troponin is 0.12.  She still has a mild residual ache.  There are no acute changes on EKG.   In retrospect, the patient is not particularly active.  However, with  her activities of daily living, she has not been able to bring on any  symptoms of chest discomfort or shortness of breath.  She has had a  decreased exercise tolerance recently.  She does swim occasionally and  found this a little more difficult recently but without symptoms.   PAST MEDICAL HISTORY:  1. Hypertension.  2. Depression.  3. Superficial lower extremity vein thrombosis on the right leg.   She has no history of diabetes or dyslipidemia.   PAST SURGICAL HISTORY:  Tonsillectomy.   ALLERGIES:  None.    MEDICATIONS:  1. Hydrochlorothiazide 25 mg daily.  2. Paxil 30 mg daily.  3. Aspirin.   SOCIAL HISTORY:  The patient does not smoke cigarettes and never has.  She does not drink alcohol.  She is a Runner, broadcasting/film/video in [name].  She has  three daughters (one daughter is a Garment/textile technologist).   FAMILY HISTORY:  Noncontributory for early coronary artery disease.   REVIEW OF SYSTEMS:  As stated in the HPI and negative for all other  systems.   PHYSICAL EXAMINATION:  The patient is pleasant and in no distress.  The  blood pressure 171/97, heart rate 78 and regular, afebrile, respiratory  rate 16.  HEENT:  Eyelids are unremarkable.  Pupils equal and reactive to light.  Fundi not visualized.  Oral mucosa unremarkable.  NECK:  No jugular venous distention at 45 degrees, carotid upstroke  brisk and symmetrical.  No bruits, no thyromegaly.  LYMPHATICS:  No cervical, axillary, or inguinal adenopathy.  LUNGS:  Clear  to auscultation bilaterally.  BACK:  No costovertebral angle tenderness.  CHEST:  Unremarkable.  HEART:  PMI not displaced or sustained, S1-S2 within normal.  No S3, no  S4, no clicks, no rubs, no murmurs.  ABDOMEN:  Obese, positive bowel sounds, normal in frequency and pitch.  No bruits, no rebound, no guarding, no midline pulsatile mass, no  hepatomegaly, no splenomegaly.  SKIN:  No rashes, no nodules.  EXTREMITIES:  2+ pulses throughout, no edema, no cyanosis, no clubbing,  no calf tenderness or swelling or cords.  NEUROLOGICAL:  Oriented to person, place, and time.  Cranial nerves II-  XII grossly intact, motor grossly intact.   Chest x-ray:  No acute disease.   Labs:  Sodium 136, potassium 4.1, troponin 0.12, INR 0.9 (other labs  pending).   EKG:  Normal sinus rhythm, rate 78, axis within normal limits, intervals  within normal limits, no acute ST-T wave changes.   ASSESSMENT/PLAN:  1. Chest discomfort:  The patient's chest discomfort has some very      worrisome features.   She does have the elevated D-dimer.  She did      have superficial lower extremity thrombosis in the past.  I do have      to rule out pulmonary emboli though she does not have significant      risk factors.  Will get a spiral CT.  Provided there is no evidence      of pulmonary emboli, the next test will be elective cardiac      catheterization.  She will be admitted.  She will be heparinized.      Will use beta blockers.  Will cycle enzymes and follow up EKGs.  2. Hypertension.  She will be managed with beta blockers initially.      She will continue on hydrochlorothiazide.  Will have a low      threshold for ACE inhibitors.  3. Risk reduction. We will get a lipid profile and treat      appropriately.  4. Obesity. Will discuss weight loss with diet and exercise going      forward.  5. Depression.  She will continue on Paxil.      Rollene Rotunda, MD, [facility]  Electronically Signed     JH/MEDQ  D:  03/05/2007  T:  03/07/2007  Job:  [id number]

## 2010-06-07 NOTE — Cardiovascular Report (Signed)
NAMERHEDA, Kathleen [name]                 ACCOUNT NO.:  [id number]   MEDICAL RECORD NO.:  [id number]           PATIENT TYPE:   LOCATION:                                 FACILITY:   PHYSICIAN:  Peter C. Eden Emms, MD, [facility]   DATE OF BIRTH:   DATE OF PROCEDURE:  DATE OF DISCHARGE:                            CARDIAC CATHETERIZATION   Coronary arteriography.   INDICATIONS:  Recurrent chest pain.  The patient was cathed by Dr.  Tonny Bollman on March 06, 2007.  The films were reviewed prior to  this study.  She appeared to have had a subtotally occluded distal  second septal perforator.  Dr. Excell Seltzer thought that this may have  represented a spontaneous dissection.   The patient was admitted with recurrent chest pain.  She was on 10 mcg  of nitro during our catheterization.  Cine-catheterization was done with  5-French catheter from right femoral artery.   Left main coronary artery was normal.   Left anterior descending artery was normal.  The patient had a large  intermediate branch, which was normal.   First septal perforator branch was normal.  The second septal perforator  branch actually seemed improved.  It was somewhat prune like in its  distal portion, but clearly filled better than seen on the previous  catheterization.   Circumflex coronary artery was nondominant.  After the takeoff of the  large intermediate branch, there was only a smaller first obtuse  marginal branch and a small AV groove branch.   The right coronary artery was extremely large and dominant.  It was  totally normal with a normal RV branch, normal PDA and PLA.   RAO ventriculography was normal, EF of 60%.  There was no gradient  across the aortic valve and no MR.  Aortic pressure was 130/70.  LV  pressure was 130/90.   IMPRESSION:  The patient's coronary arteries appear clean.  The  previously dissected second septal perforator actually fills more  distally on this exam.  The patient will continue her  aspirin and  Plavix.  At some point in time, it may be worthwhile to do a  hypercoagulable workup on her.   We will try to wean her nitro and get her discharged later today so long  as her groin heals well.      [name]. Eden Emms, MD, [facility]  Electronically Signed     PCN/MEDQ  D:  05/17/2007  T:  05/18/2007  Job:  [id number]

## 2010-06-07 NOTE — H&P (Signed)
NAME:  Kathleen [name], Kathleen [name]                 ACCOUNT NO.:  [id number]   MEDICAL RECORD NO.:  [id number]          PATIENT TYPE:  OBV   LOCATION:  3739                         FACILITY:  [facility]   PHYSICIAN:  Bevelyn Buckles. Bensimhon, MDDATE OF BIRTH:  1955/07/16   DATE OF ADMISSION:  05/21/2008  DATE OF DISCHARGE:                              HISTORY & PHYSICAL   PRIMARY CARDIOLOGIST:  Rollene Rotunda, MD, [facility].   PRIMARY CARE PHYSICIAN:  Dr. Francesca Jewett.   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  Symptoms began approximately 5 days ago.  She became short  of breath with some left-sided poking pain lasting approximately 1  hour.  The patient did take a nitroglycerin and the pain was relieved.  When the pain reoccurred, she did not take any more nitroglycerin  because she states that it causes a headache.  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  The symptoms began Sunday night.  The patient also describes increasing  weakness and nausea while she was getting ready for school.  The pain  lasted approximately 30 minutes, which she described as a poking pain  on the left side of her chest, nonradiating.  She ignored it.  While she  was at work, Tax adviser took her blood pressure, she was found to be  hypertensive and the patient's friend drove her to the emergency room.  The patient has not had any further pain since she has been admitted and  is currently comfortable in the emergency room.   REVIEW OF SYSTEMS:  Positive for chest pain, shortness of breath, mild  lower extremity edema bilaterally, generalized weakness, and fatigue.  Multiple bruises on extremities and some history of GERD.  All other  systems have been reviewed and found to be negative.   PAST MEDICAL HISTORY:  1. Non-Q-wave MI in February 2009 with a septal perforator occlusion.      a.     Cardiac catheterization at that time revealed left main       normal, second septal occluded, otherwise LAD was normal.  Left       circumflex normal and right coronary artery normal.      b.     Cardiac catheterization in April 2009 with no changes.  2. Hypertension.  3. Depression and bipolar disease.  4. Atrial arrhythmias.  5. Superficial lower extremity thrombus by history.  6. Dyslipidemia.  7. Tracer drug study participant.   PAST SURGICAL HISTORY:  Tonsillectomy and bilateral tubal ligation.   SOCIAL HISTORY:  She lives in [place] alone.  She is a Runner, broadcasting/film/video at  [employer].  She is divorced with 3 adult children.  She does not drink.  She  does not smoke.  She denies use of alcohol.   FAMILY HISTORY:  Mother with cancer.  Father with Alzheimer.  Sister and  2 brothers with high blood pressure.   CURRENT MEDICATIONS:  1. Triamterene hydrochlorothiazide 37.5/25.  2. Aspirin 81 mg daily.  3. Maxair p.r.n.  4. Plavix 75 mg daily.  5. Bisoprolol 2.5 mg daily.  6. Multivitamin daily.  7. Fish oil daily.  8. Depakote 2000 mg daily.  9. Clonazepam 0.5 mg p.r.n.  10.Lithium 300 mg in the a.m., 600 mg in the p.m.  11.Abilify 10 mg daily.  12.Tracer study drug participant.   CURRENT LABORATORY DATA:  Sodium 137, potassium 4.1, chloride 103, CO2  of 29, BUN 15, creatinine 0.95, glucose 96, hemoglobin 13.4, hematocrit  37.9, white blood cells 5.8, platelets 160.  BNP 86.0, troponin less  than 0.05 respectively.  Chest x-ray revealing no active disease.  EKG  revealing normal sinus rhythm, ventricular rate of 64 beats per minute.   PHYSICAL EXAMINATION:  VITAL SIGNS:  Blood pressure 102/63, pulse 73,  respirations 15, temperature 97.3,  O2 sat 100% on room air.  GENERAL:  She is awake, alert, and oriented.  Flat affect.  HEENT: Head is normocephalic and atraumatic.  The patient wears glasses.  NECK:  Supple without JVD or carotid bruits.  CARDIOVASCULAR:  Tachycardic with 1/6 systolic murmur auscultated.  Pulses are 2+ and equal without bruits.  LUNGS:  Clear to auscultation without wheezes, rales, or rhonchi.  SKIN:  Multiple bruises noted on lower extremities and buttocks.  ABDOMEN:  Soft, nontender.  Bowel sounds 2+.  EXTREMITIES:  Pitting edema 1+ bilaterally.  NEUROLOGIC:  Cranial nerves II through XII are grossly intact.  She does  have some tremors, however.   IMPRESSION:  1. Atypical chest pain.  2. Bilateral lower extremity edema with pain, rule out DVT.  3. History of non-Q-wave myocardial infarction, totally occluded      septal perforator.  The cath showed normal vessels with subsequent      redo in April with no changes.  4. History of bipolar disorder.  5. History of superficial lower extremity thrombus.   PLAN:  A 55 year old Caucasian female who presents to the ER after  experiencing intermittent chest discomfort, which she describes as  poking pain with associated weakness x5 days, also complains of lower  extremity edema and pain.  The patient did have a long bus ride, 10  hours to [place] on Thursday and came back on Sunday with complaints of  discomfort and lower extremity edema since then.  The patient will be  admitted for observation.  We  will rule out DVT.  Check a D-dimer and to bilateral Doppler studies to  rule out DVT.  Check a lithium level and follow making further  recommendations.  The patient will be given 1 dose of IV Lasix and  hopefully this will help with current symptoms.      Bettey Mare. Lyman Bishop, NP      Bevelyn Buckles. Bensimhon, MD  Electronically Signed    KML/MEDQ  D:  05/21/2008  T:  05/22/2008  Job:  [id number]   cc:   Dr. Francesca Jewett

## 2010-06-07 NOTE — Assessment & Plan Note (Signed)
[facility]                            CARDIOLOGY OFFICE NOTE   GEORGEAN, SPAINHOWER                        MRN:          [id number]  DATE:06/03/2007                            DOB:          01-15-1956    PRIMARY CARE PHYSICIAN:  Lise Auer, MD.   REASON FOR PRESENTATION:  Evaluate patient with chest pain.   HISTORY OF PRESENT ILLNESS:  The patient was recently hospitalized on  April 23 for chest discomfort.  She had a previous non-ST-segment  elevation myocardial function in February 2009 and had symptoms similar  to this.  However, during that hospitalization she ruled out for  myocardial infarction.  She had a cardiac catheterization demonstrating  normal coronaries.  In particular, the previously-dissected second  septal perforator (it had been felt to be a dissection or a thrombus)  seemed to fill more completely than previous.   The patient has been back at cardiac rehab.  She is not having any of  the chest discomfort that prompted that admission.  She has not had any  of the presyncope that she had been experiencing.  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  She had been having some palpitations with premature  atrial contractions.  There was some questionable irregularity on  occasional tracings on an event monitor and brief bursts of atrial  fibrillation could not be excluded.  However, the patient has had no  further sustained symptomatic palpitations.  She will occasionally feel  some skipping of her heartbeat.  She denies any new shortness of breath.  She is having PND or orthopnea.  She is having some orthostatic symptoms  but she knows how to avoid these.  She has some post-exercise  lightheadedness as well.  She knows how to deal with this.   PAST MEDICAL HISTORY:  1. Non-Q-wave myocardial infarction with second septal perforator      occlusion February 2009.  2. Well-preserved ejection fraction.  3. Hypertension.  4. Depression.  5. Superficial lower extremity venous thrombosis.  6. Tonsillectomy.  7. Dyslipidemia.   ALLERGIES:  None.   MEDICATIONS:  1. Simvastatin 20 mg daily.  2. Triamterine/hydrochlorothiazide 37.5/25 mg daily.  3. Paroxetine 30 mg daily.  4. Aspirin 81 mg daily.  5. Maxair.  6. Plavix 75 mg daily.  7. Bisoprolol 2.5 mg daily.  8. TRA2 PTRA2-P study drug, Pristiq, B complex.   REVIEW OF SYSTEMS:  As stated in HPI and otherwise negative for other  systems.   PHYSICAL EXAMINATION:  The patient is in no distress.  Blood pressure 117/71, heart rate 81 and regular, weight 286 pounds.  HEENT:  Eyelids unremarkable.  Pupils equal, round and react to light.  Fundi not visualized,  oral mucosa unremarkable.  NECK:  No jugular venous distention at 45 degrees.  Carotid upstroke  brisk and symmetrical.  No bruits, no thyromegaly.  LYMPHATICS:  No adenopathy.  LUNGS:  Clear to auscultation bilaterally.  BACK:  No costovertebral angle tenderness.  CHEST:  Unremarkable.  HEART:  PMI not displaced or sustained, S1 and S2 within normal limits,  no S3, no S4, no clicks, no rubs, no murmurs.  ABDOMEN:  Obese, positive bowel sounds, normal in frequency and pitch,  no bruits, no rebound, guarding or midline pulsatile mass.  No  hepatomegaly.  SKIN:  Right upper chest rash with shingles.  NEUROLOGIC:  Oriented to person place and time.  Cranial nerves II-XII  grossly intact.  Motor grossly intact.   ASSESSMENT/PLAN:  1. Coronary disease.  The patient has improved flow in her second      septal perforator on this repeat catheterization.  She has no other      obstructive disease.  At this point she will continue with medical      management following her non-Q-wave myocardial infarction in      February.  2. Shingles.  She is having this followed by Dr. Francesca Jewett.  3. Obesity.  The patient is encouraged to continue attempts at  weight      loss through exercise and diet.  4. Dyslipidemia per Dr. Francesca Jewett.  The goal would be an LDL less than 100      and HDL greater than 50.  5. Follow-up.  I will see the patient back in 4 months or sooner if      needed.     Rollene Rotunda, MD, [facility], [place]  Electronically Signed    JH/MedQ  DD: 06/03/2007  DT: 06/03/2007  Job #: [id number]   cc:   Lise Auer

## 2010-06-07 NOTE — Op Note (Signed)
NAMESEBRINA, Kathleen [name]                 ACCOUNT NO.:  [id number]   MEDICAL RECORD NO.:  [id number]          PATIENT TYPE:  AMB   LOCATION:  SDC                           FACILITY:  [facility]   PHYSICIAN:  Duke Salvia. Marcelle Overlie, M.D.DATE OF BIRTH:  05-27-1955   DATE OF PROCEDURE:  09/01/2008  DATE OF DISCHARGE:  09/01/2008                               OPERATIVE REPORT   PREOPERATIVE DIAGNOSES:  Abnormal uterine bleeding, endometrial polyp.   POSTOPERATIVE DIAGNOSES:  Abnormal uterine bleeding, endometrial polyp.   PROCEDURES:  Dilation and curettage hysteroscopy, removal of endometrial  polyps.   SURGEON:  Duke Salvia. Marcelle Overlie, MD   ANESTHESIA:  General, LMA.   SPECIMENS REMOVED:  Endometrial curettings including polyp.   COMPLICATIONS:  None.   BLOOD LOSS:  Minimal.   PROCEDURE AND FINDINGS:  The patient was taken to the operating room  after adequate level of general anesthesia by LMA was achieved with legs  in stirrups, perineum and vagina prepped and draped with Betadine.  The  bladder was drained, EUA carried out, uterus midposition, normal size,  adnexa negative.  Speculum was positioned.  Cervix grasped with  tenaculum.  Paracervical block created by infiltrating at 3 and  9  o'clock submucosally, 5-7 mL of 1% Xylocaine on each side after negative  aspiration.  Uterus was sounded to 7-8 cm, progressively dilated to a 27  Pratt.  A 4-mm continuous-flow hysteroscope was inserted, polyp could be  easily seen halfway up the posterior wall, sharp curettage was carried  out removing the polyp.  There was very minimal other endometrial  tissue.  This was all sent as one specimen.  The scope was reinserted.  The cavity was irrigated and noted to be clear and the polyp completely  removed.  She tolerated this well, went to recovery room in good  condition.      Richard M. Marcelle Overlie, M.D.  Electronically Signed     RMH/MEDQ  D:  09/01/2008  T:  09/02/2008  Job:  [id number]

## 2010-08-01 ENCOUNTER — Other Ambulatory Visit: Payer: Self-pay | Admitting: Obstetrics and Gynecology

## 2010-08-01 ENCOUNTER — Other Ambulatory Visit: Payer: Self-pay | Admitting: Family Medicine

## 2010-08-01 DIAGNOSIS — R921 Mammographic calcification found on diagnostic imaging of breast: Secondary | ICD-10-CM

## 2010-08-01 MED ORDER — SIMVASTATIN 20 MG PO TABS: 20.0000 mg | ORAL_TABLET | Freq: Every day | ORAL | Status: DC

## 2010-08-01 NOTE — Telephone Encounter (Signed)
Sent refill, pt is due for ov.

## 2010-08-12 ENCOUNTER — Ambulatory Visit: Payer: Self-pay | Admitting: Family Medicine

## 2010-08-15 ENCOUNTER — Encounter: Payer: Self-pay | Admitting: *Deleted

## 2010-08-15 ENCOUNTER — Encounter: Payer: Self-pay | Admitting: Family Medicine

## 2010-08-15 ENCOUNTER — Ambulatory Visit (INDEPENDENT_AMBULATORY_CARE_PROVIDER_SITE_OTHER): Payer: BC Managed Care – PPO | Admitting: Family Medicine

## 2010-08-15 DIAGNOSIS — E785 Hyperlipidemia, unspecified: Secondary | ICD-10-CM

## 2010-08-15 DIAGNOSIS — I10 Essential (primary) hypertension: Secondary | ICD-10-CM

## 2010-08-15 LAB — BASIC METABOLIC PANEL
CO2: 30 mEq/L (ref 19–32)
Calcium: 9.6 mg/dL (ref 8.4–10.5)
GFR: 65.74 mL/min (ref >60.00)
Sodium: 137 mEq/L (ref 135–145)

## 2010-08-15 LAB — LIPID PANEL
HDL: 63 mg/dL (ref >39.00)
LDL Cholesterol: 65 mg/dL (ref 0–99)
Total CHOL/HDL Ratio: 2
VLDL: 18.2 mg/dL (ref 0.0–40.0)

## 2010-08-15 LAB — HEPATIC FUNCTION PANEL
Alkaline Phosphatase: 84 U/L (ref 39–117)
Bilirubin, Direct: 0.1 mg/dL (ref 0.0–0.3)
Total Protein: 7.7 g/dL (ref 6.0–8.3)

## 2010-08-15 NOTE — Assessment & Plan Note (Signed)
BP is excellent.  Pt's SOB is not likely to be cardiac related as it is intermittent and occurs at rest.  Encouraged her to use inhaler prn.  Also suggested using Benzo prn for anxiety.  If sxs persist- pt to call.  Will follow.

## 2010-08-15 NOTE — Assessment & Plan Note (Signed)
Check labs.  Adjust meds prn  

## 2010-08-15 NOTE — Patient Instructions (Signed)
Schedule your complete physical in 6 months- do not eat before this Call me sooner if need be You look great!  Keep up the good work! We'll notify you of your lab results Enjoy the rest of your summer and have a safe trip to [place]!

## 2010-08-15 NOTE — Progress Notes (Signed)
  Subjective:    Patient ID: Kathleen [name], female    DOB: [birth year], 55 y.o.   MRN: [id number]  HPI HTN- chronic problem for pt.  On Maxzide.  No CP, mild SOB when lying in bed- reports this is intermittent.  Is not currently using inhaler.  Thinks there may be an anxiety component to it.  No edema, HAs, visual changes.  Hyperlipidemia- chronic problem for pt.  On Zocor.  No abd pain, N/V. Myalgias.   Review of Systems For ROS see HPI     Objective:   Physical Exam  Constitutional: She is oriented to person, place, and time. She appears well-developed and well-nourished. No distress.  HENT:  Head: Normocephalic and atraumatic.  Eyes: Conjunctivae and EOM are normal. Pupils are equal, round, and reactive to light.  Neck: Normal range of motion. Neck supple. No thyromegaly present.  Cardiovascular: Normal rate, regular rhythm, normal heart sounds and intact distal pulses.   No murmur heard. Pulmonary/Chest: Effort normal and breath sounds normal. No respiratory distress.  Abdominal: Soft. She exhibits no distension. There is no tenderness.  Musculoskeletal: She exhibits no edema.  Lymphadenopathy:    She has no cervical adenopathy.  Neurological: She is alert and oriented to person, place, and time.  Skin: Skin is warm and dry.  Psychiatric: She has a normal mood and affect. Her behavior is normal.          Assessment & Plan:

## 2010-08-19 ENCOUNTER — Telehealth: Payer: Self-pay | Admitting: Cardiology

## 2010-08-19 NOTE — Telephone Encounter (Signed)
Generic plavix refill needed, [facility] [place]

## 2010-08-22 MED ORDER — CLOPIDOGREL BISULFATE 75 MG PO TABS: 75.0000 mg | ORAL_TABLET | Freq: Every day | ORAL | Status: DC

## 2010-09-10 ENCOUNTER — Other Ambulatory Visit: Payer: Self-pay | Admitting: Family Medicine

## 2010-09-13 ENCOUNTER — Ambulatory Visit
Admission: RE | Admit: 2010-09-13 | Discharge: 2010-09-13 | Disposition: A | Discharge status: Home / Self Care | Payer: BC Managed Care – PPO | Source: Ambulatory Visit | Attending: Obstetrics and Gynecology | Admitting: Obstetrics and Gynecology

## 2010-09-13 DIAGNOSIS — R921 Mammographic calcification found on diagnostic imaging of breast: Secondary | ICD-10-CM

## 2010-10-14 LAB — COMPREHENSIVE METABOLIC PANEL
ALT: 22
AST: 51 — ABNORMAL HIGH
CO2: 24
Chloride: 100
Creatinine, Ser: 0.82
GFR calc Af Amer: >60
GFR calc non Af Amer: >60
Glucose, Bld: 118 — ABNORMAL HIGH
Total Bilirubin: 0.7

## 2010-10-14 LAB — CBC
HCT: 33.6 — ABNORMAL LOW
Hemoglobin: 13
MCHC: 34.1
MCV: 78.7
MCV: 79.1
RBC: 4.25
RBC: 4.86
WBC: 6.8
WBC: 8

## 2010-10-14 LAB — POCT CARDIAC MARKERS
CKMB, poc: 3
CKMB, poc: 9.6
Myoglobin, poc: 168
Myoglobin, poc: 232
Operator id: 294501
Troponin i, poc: 0.83

## 2010-10-14 LAB — PROTIME-INR
INR: 0.9
INR: 1
Prothrombin Time: 11.9

## 2010-10-14 LAB — I-STAT 8, (EC8 V) (CONVERTED LAB)
Bicarbonate: 26.1 — ABNORMAL HIGH
Glucose, Bld: 102 — ABNORMAL HIGH
TCO2: 27
pH, Ven: 7.429 — ABNORMAL HIGH

## 2010-10-14 LAB — CARDIAC PANEL(CRET KIN+CKTOT+MB+TROPI)
CK, MB: 54.4 — ABNORMAL HIGH
Total CK: 256 — ABNORMAL HIGH
Total CK: 392 — ABNORMAL HIGH
Troponin I: 7.97

## 2010-10-14 LAB — BASIC METABOLIC PANEL
CO2: 26
Chloride: 106
GFR calc Af Amer: >60
Potassium: 3.5

## 2010-10-14 LAB — TROPONIN I: Troponin I: 1.06

## 2010-10-14 LAB — LIPID PANEL
LDL Cholesterol: 95
VLDL: 48 — ABNORMAL HIGH

## 2010-10-14 LAB — HEPARIN LEVEL (UNFRACTIONATED)
Heparin Unfractionated: 0.36
Heparin Unfractionated: 0.69

## 2010-10-14 LAB — APTT
aPTT: 144 — ABNORMAL HIGH
aPTT: 26

## 2010-10-17 LAB — CBC
MCHC: 34
MCV: 79.5
Platelets: 199
RDW: 15

## 2010-10-17 LAB — MAGNESIUM: Magnesium: 2.4

## 2010-10-17 LAB — BASIC METABOLIC PANEL
BUN: 23
CO2: 26
Chloride: 101
Creatinine, Ser: 1.33 — ABNORMAL HIGH
Glucose, Bld: 81

## 2010-10-18 LAB — CBC
HCT: 39
Platelets: 204
RDW: 15.1

## 2010-10-18 LAB — DIFFERENTIAL
Eosinophils Absolute: 0.3
Eosinophils Relative: 4
Lymphs Abs: 1.5
Monocytes Relative: 6

## 2010-10-18 LAB — COMPREHENSIVE METABOLIC PANEL
ALT: 35
AST: 29
Albumin: 3.6
Alkaline Phosphatase: 96
Calcium: 8.9
Potassium: 4.1
Total Protein: 6.2

## 2010-10-18 LAB — PROTIME-INR: Prothrombin Time: 12.2

## 2010-10-18 LAB — CARDIAC PANEL(CRET KIN+CKTOT+MB+TROPI)
CK, MB: 1.5
Relative Index: 1.2
Total CK: 124
Troponin I: 0.01
Troponin I: 0.01

## 2010-10-18 LAB — BASIC METABOLIC PANEL
BUN: 17
Calcium: 9.2
GFR calc non Af Amer: >60
Glucose, Bld: 105 — ABNORMAL HIGH
Potassium: 3.7

## 2010-10-18 LAB — LIPID PANEL
Cholesterol: 126
HDL: 42
Total CHOL/HDL Ratio: 3
VLDL: 13

## 2010-10-18 LAB — CK TOTAL AND CKMB (NOT AT ARMC): Relative Index: 1.2

## 2010-10-21 ENCOUNTER — Other Ambulatory Visit: Payer: Self-pay | Admitting: *Deleted

## 2010-10-21 ENCOUNTER — Other Ambulatory Visit: Payer: Self-pay | Admitting: Family Medicine

## 2010-10-21 MED ORDER — CLOPIDOGREL BISULFATE 75 MG PO TABS: 75.0000 mg | ORAL_TABLET | Freq: Every day | ORAL | Status: DC

## 2010-10-21 NOTE — Telephone Encounter (Signed)
Rx done on 09/10/10 #30 with 5 refills

## 2010-10-24 ENCOUNTER — Other Ambulatory Visit: Payer: Self-pay | Admitting: Family Medicine

## 2010-10-24 NOTE — Telephone Encounter (Signed)
Called the pharmacy and spoke with Adela Glimpse and he stated pt's rx was ready for pickup.

## 2010-10-31 ENCOUNTER — Ambulatory Visit (INDEPENDENT_AMBULATORY_CARE_PROVIDER_SITE_OTHER): Payer: BC Managed Care – PPO | Admitting: Cardiology

## 2010-10-31 ENCOUNTER — Encounter: Payer: Self-pay | Admitting: Cardiology

## 2010-10-31 DIAGNOSIS — R0681 Apnea, not elsewhere classified: Secondary | ICD-10-CM

## 2010-10-31 DIAGNOSIS — G4733 Obstructive sleep apnea (adult) (pediatric): Secondary | ICD-10-CM | POA: Insufficient documentation

## 2010-10-31 DIAGNOSIS — E785 Hyperlipidemia, unspecified: Secondary | ICD-10-CM

## 2010-10-31 DIAGNOSIS — I2510 Atherosclerotic heart disease of native coronary artery without angina pectoris: Secondary | ICD-10-CM

## 2010-10-31 DIAGNOSIS — I10 Essential (primary) hypertension: Secondary | ICD-10-CM

## 2010-10-31 DIAGNOSIS — G4730 Sleep apnea, unspecified: Secondary | ICD-10-CM

## 2010-10-31 DIAGNOSIS — E669 Obesity, unspecified: Secondary | ICD-10-CM

## 2010-10-31 NOTE — Progress Notes (Signed)
HPI The patient presents for follow up of her CAD.  Since I last saw her she has done well.  The patient denies any new symptoms such as chest discomfort, neck or arm discomfort. There has been no new shortness of breath, PND or orthopnea. There have been no reported palpitations, presyncope or syncope.  She is swimming about two times per week.  However, she does report fatigue, snoring and witnessed apnea.  Allergies  Allergen Reactions  . Aspirin   . Barbiturates   . Clarithromycin   . Eggs Or Egg-Derived Products   . Erythromycin   . Itraconazole   . Ketoconazole   . Phenytoin   . Rifampin   . Warfarin Sodium     Current Outpatient Prescriptions  Medication Sig Dispense Refill  . ARIPiprazole (ABILIFY) 10 MG tablet Take 5 mg by mouth daily.        [name] aspirin 81 MG tablet Take 81 mg by mouth daily.        . clonazePAM (KLONOPIN) 0.5 MG tablet Take 0.5 mg by mouth as needed.        . clopidogrel (PLAVIX) 75 MG tablet Take 1 tablet (75 mg total) by mouth daily.  30 tablet  6  . divalproex (DEPAKOTE) 500 MG EC tablet Take 1,500 mg by mouth daily.        . Multiple Vitamin (MULTIVITAMIN) tablet Take 1 tablet by mouth daily.        . nitroGLYCERIN (NITROSTAT) 0.4 MG SL tablet Place 0.4 mg under the tongue every 5 (five) minutes as needed.        . simvastatin (ZOCOR) 20 MG tablet TAKE 1 TABLET BY MOUTH AT BEDTIME  30 tablet  5  . traZODone (DESYREL) 50 MG tablet Take 50 mg by mouth daily as needed.        . triamterene-hydrochlorothiazide (MAXZIDE-25) 37.5-25 MG per tablet Take 1 tablet by mouth daily.  30 tablet  11    Past Medical History  Diagnosis Date  . 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 Hypertension   . Depression   . Lower extremity deep venous thrombosis   . Dyslipidemia   . Psychiatric hospitalization 05/2008    Past Surgical History  Procedure Date  . Breast mass excision     ae 19, benign tumor  . Knee surgery     right, removed  cartilage  . Tubal ligation   . Tonsillectomy     ROS:  As stated in the HPI and negative for all other systems.  PHYSICAL EXAM BP 110/78  Pulse 77  Ht 5\' 11"  (1.803 m)  Wt 244 lb (110.678 kg)  BMI 34.03 kg/m2 GENERAL:  Well appearing HEENT:  Pupils equal round and reactive, fundi not visualized, oral mucosa unremarkable NECK:  No jugular venous distention, waveform within normal limits, carotid upstroke brisk and symmetric, no bruits, no thyromegaly LYMPHATICS:  No cervical, inguinal adenopathy LUNGS:  Clear to auscultation bilaterally BACK:  No CVA tenderness CHEST:  Unremarkable HEART:  PMI not displaced or sustained,S1 and S2 within normal limits, no S3, no S4, no clicks, no rubs, no murmurs ABD:  Flat, positive bowel sounds normal in frequency in pitch, no bruits, no rebound, no guarding, no midline pulsatile mass, no hepatomegaly, no splenomegaly EXT:  2 plus pulses throughout, no edema, no cyanosis no clubbing SKIN:  No rashes no nodules NEURO:  Cranial nerves II through XII grossly intact, motor grossly intact throughout PSYCH:  Cognitively intact, oriented to person place and time  EKG:  Sinus rhythm, rate 77, axis within normal limits, intervals within normal limits, no acute ST-T wave changes.  ASSESSMENT AND PLAN

## 2010-10-31 NOTE — Assessment & Plan Note (Signed)
The blood pressure is at target. No change in medications is indicated. We will continue with therapeutic lifestyle changes (TLC).  

## 2010-10-31 NOTE — Assessment & Plan Note (Signed)
The patient has no new sypmtoms.  No further cardiovascular testing is indicated.  We will continue with aggressive risk reduction and meds as listed.  

## 2010-10-31 NOTE — Patient Instructions (Signed)
Your physician has recommended that you have a sleep study. This test records several body functions during sleep, including: brain activity, eye movement, oxygen and carbon dioxide blood levels, heart rate and rhythm, breathing rate and rhythm, the flow of air through your mouth and nose, snoring, body muscle movements, and chest and belly movement.  The current medical regimen is effective;  continue present plan and medications.  Follow up in 1 year with Dr Antoine Poche.  You will receive a letter in the mail 2 months before you are due.  Please call us when you receive this letter to schedule your follow up appointment.

## 2010-10-31 NOTE — Assessment & Plan Note (Signed)
She has gained a little weight back.  We discussed diet and exercise again today.

## 2010-10-31 NOTE — Assessment & Plan Note (Signed)
She has multiple symptoms consistent with sleep apnea.  I will schedule a sleep study.

## 2010-10-31 NOTE — Assessment & Plan Note (Signed)
Her lipid profile was excellent.  She will continue on the meds as listed.

## 2010-11-20 ENCOUNTER — Ambulatory Visit (HOSPITAL_BASED_OUTPATIENT_CLINIC_OR_DEPARTMENT_OTHER): Payer: BC Managed Care – PPO | Attending: Cardiology

## 2010-11-20 DIAGNOSIS — G4710 Hypersomnia, unspecified: Secondary | ICD-10-CM | POA: Insufficient documentation

## 2010-11-27 DIAGNOSIS — G4730 Sleep apnea, unspecified: Secondary | ICD-10-CM

## 2010-11-27 DIAGNOSIS — G4710 Hypersomnia, unspecified: Secondary | ICD-10-CM

## 2010-11-28 NOTE — Procedures (Signed)
NAME:  Kathleen [name], Kathleen [name]                 ACCOUNT NO.:  [id number]  MEDICAL RECORD NO.:  [id number]          PATIENT TYPE:  OUT  LOCATION:  SLEEP CENTER                 [facility]ACILITY:  [facility]  PHYSICIAN:  Barbaraann Share, MD,FCCPDATE OF BIRTH:  Nov 26, 1955  DATE OF STUDY:  11/20/2010                           NOCTURNAL POLYSOMNOGRAM  REFERRING PHYSICIAN:  Rollene Rotunda, MD, [facility]  INDICATION FOR STUDY:  Hypersomnia with sleep apnea.  EPWORTH SLEEPINESS SCORE:  16.  MEDICATIONS:  SLEEP ARCHITECTURE:  The patient had a total sleep time of 337 minutes with no slow wave sleep, but adequate quantity of REM.  Sleep onset latency was prolonged at 57 minutes, and REM onset was normal at 75 minutes.  Sleep efficiency was moderately reduced at 81%.  RESPIRATORY DATA:  The patient was found to have 1 obstructive apnea and 6 hypopneas, for an apnea-hypopnea index of only 1.2 events per hour. The events occurred in all body positions and there was mild snoring noted throughout.  The patient did not meet split night protocols secondary to insufficient respiratory events.  OXYGEN DATA:  There was O2 desaturation transiently as low as 89% with the patient's obstructive events.  CARDIAC DATA:  Rare PVC noted, but no clinically significant arrhythmias were seen.  MOVEMENT-PARASOMNIA:  The patient had no significant leg jerks or other abnormal behaviors noted.  IMPRESSIONS/RECOMMENDATIONS: 1. Small numbers of obstructive events, which do not meet the AHI     criteria for the obstructive sleep apnea syndrome. 2. Rare PVC noted, but no clinically significant arrhythmias were     seen.     Barbaraann Share, MD,FCCP Diplomate, American Board of Sleep Medicine Electronically Signed   [facility]/MEDQ  D:  11/27/2010 15:06:50  T:  11/27/2010 22:14:15  Job:  [id number]

## 2010-12-29 ENCOUNTER — Ambulatory Visit (INDEPENDENT_AMBULATORY_CARE_PROVIDER_SITE_OTHER): Payer: BC Managed Care – PPO | Admitting: Internal Medicine

## 2010-12-29 ENCOUNTER — Encounter: Payer: Self-pay | Admitting: Internal Medicine

## 2010-12-29 DIAGNOSIS — R509 Fever, unspecified: Secondary | ICD-10-CM

## 2010-12-29 DIAGNOSIS — J029 Acute pharyngitis, unspecified: Secondary | ICD-10-CM

## 2010-12-29 LAB — POCT RAPID STREP A (OFFICE): Rapid Strep A Screen: NEGATIVE

## 2010-12-29 NOTE — Patient Instructions (Signed)
Zicam Melts or Zinc lozenges ; vitamin C 2000 mg daily; & Echinacea for 4-7 days. NSAIDS ( Aleve, Advil, Naproxen) or Tylenol every 4 hrs as needed for fever as discussed based on label recommendations  Report fever response, exudate("pus") or progressive pain. Plain Mucinex for thick secretions ;force NON dairy fluids for next 48 hrs. Use a Neti pot daily as needed for sinus congestion

## 2010-12-29 NOTE — Progress Notes (Signed)
  Subjective:    Patient ID: Kathleen [name], female    DOB: [birth year], 55 y.o.   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 Progression of symptoms:to head congestion Treatments/response:ASA with minimal response Present symptoms: Fever/chills/sweats:temp to 102 last night Frontal headache:yes Facial pain:yes Nasal purulence:no  Dental pain:yes Lymphadenopathy:no Wheezing/shortness of breath:no Cough/sputum/hemoptysis:clear Associated extrinsic/allergic symptoms:itchy eyes/ sneezing:no Past medical history: Seasonal allergies; yes/asthma:yes Smoking history:never           Review of Systems     Objective:   Physical Exam General appearance is of good health and nourishment; no acute distress or increased work of breathing is present. She appears tired No  lymphadenopathy about the head, neck, or axilla noted.   Eyes: No conjunctival inflammation or lid edema is present.   Ears:  External ear exam shows no significant lesions or deformities.  Otoscopic examination reveals clear canals, tympanic membranes are intact bilaterally without bulging, retraction, inflammation or discharge.  Nose:  External nasal examination shows no deformity or inflammation. Nasal mucosa are dry  without lesions or exudates. No obstruction to airflow.   Oral exam: Dental hygiene is good; lips and gums are healthy appearing.There is no oropharyngeal erythema or exudate noted.      Heart:  Normal rate and regular rhythm. S1 and S2 normal without gallop,  click, rub or other extra sounds. Grade 1/6 systolic murmur  Lungs:Chest clear to auscultation; no wheezes, rhonchi,rales ,or rubs present.No increased work of breathing.  Dry cough  Extremities:  No cyanosis, edema, or clubbing  noted    Skin: Warm & dry        Assessment & Plan:  #1 pharyngitis with a negative rapid strep. Criteria for  rhinosinusitis or purulent bronchitis and not present.  Plan: See orders and recommendations.

## 2010-12-30 LAB — CBC WITH DIFFERENTIAL/PLATELET
Basophils Relative: 0.1 % (ref 0.0–3.0)
Eosinophils Relative: 1 % (ref 0.0–5.0)
Hemoglobin: 14.8 g/dL (ref 12.0–15.0)
Lymphocytes Relative: 14.1 % (ref 12.0–46.0)
MCHC: 34.2 g/dL (ref 30.0–36.0)
Monocytes Relative: 9.2 % (ref 3.0–12.0)
Neutro Abs: 4.4 10*3/uL (ref 1.4–7.7)
RBC: 4.81 Mil/uL (ref 3.87–5.11)

## 2011-01-14 ENCOUNTER — Encounter (HOSPITAL_BASED_OUTPATIENT_CLINIC_OR_DEPARTMENT_OTHER): Payer: Self-pay | Admitting: *Deleted

## 2011-01-14 ENCOUNTER — Emergency Department (HOSPITAL_BASED_OUTPATIENT_CLINIC_OR_DEPARTMENT_OTHER)
Admission: EM | Admit: 2011-01-14 | Discharge: 2011-01-15 | Disposition: A | Discharge status: Home / Self Care | Payer: BC Managed Care – PPO | Attending: Emergency Medicine | Admitting: Emergency Medicine

## 2011-01-14 DIAGNOSIS — Z79899 Other long term (current) drug therapy: Secondary | ICD-10-CM | POA: Insufficient documentation

## 2011-01-14 DIAGNOSIS — X58XXXA Exposure to other specified factors, initial encounter: Secondary | ICD-10-CM | POA: Insufficient documentation

## 2011-01-14 DIAGNOSIS — S60229A Contusion of unspecified hand, initial encounter: Secondary | ICD-10-CM | POA: Insufficient documentation

## 2011-01-14 DIAGNOSIS — J45909 Unspecified asthma, uncomplicated: Secondary | ICD-10-CM | POA: Insufficient documentation

## 2011-01-14 DIAGNOSIS — S60222A Contusion of left hand, initial encounter: Secondary | ICD-10-CM

## 2011-01-14 DIAGNOSIS — F3289 Other specified depressive episodes: Secondary | ICD-10-CM | POA: Insufficient documentation

## 2011-01-14 DIAGNOSIS — Y92009 Unspecified place in unspecified non-institutional (private) residence as the place of occurrence of the external cause: Secondary | ICD-10-CM | POA: Insufficient documentation

## 2011-01-14 DIAGNOSIS — I10 Essential (primary) hypertension: Secondary | ICD-10-CM | POA: Insufficient documentation

## 2011-01-14 DIAGNOSIS — F329 Major depressive disorder, single episode, unspecified: Secondary | ICD-10-CM | POA: Insufficient documentation

## 2011-01-14 DIAGNOSIS — E785 Hyperlipidemia, unspecified: Secondary | ICD-10-CM | POA: Insufficient documentation

## 2011-01-14 NOTE — ED Notes (Signed)
Left hand bruised and swollen x 30 mins- denies known injury- on plavix

## 2011-01-14 NOTE — ED Provider Notes (Signed)
History     CSN: [id number]  Arrival date & time 01/14/11  2035   First MD Initiated Contact with Patient 01/14/11 2331      Chief Complaint  Patient presents with  . Hand Problem    (Consider location/radiation/quality/duration/timing/severity/associated sxs/prior treatment) HPI Comments: Pt has been taking plavix for a couple years.  She was loading the washing machine today and noticed her L hand  Was swollen and ecchymotic.  She does not recall any trauma and denies.  No other sites of bruising.  The history is provided by the patient. No language interpreter was used.    Past Medical History  Diagnosis Date  . 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 Hypertension   . Depression   . Lower extremity deep venous thrombosis   . Dyslipidemia   . Psychiatric hospitalization 05/2008  . Asthma     Past Surgical History  Procedure Date  . Breast mass excision     ae 19, benign tumor  . Knee surgery     right, removed cartilage  . Tubal ligation   . Tonsillectomy     Family History  Problem Relation Age of Onset  . Breast cancer Mother 34    History  Substance Use Topics  . Smoking status: Never Smoker   . Smokeless tobacco: Never Used  . Alcohol Use: Yes     occ.    OB History    Grav Para Term Preterm Abortions TAB SAB Ect Mult Living                  Review of Systems  Musculoskeletal:       Hand swelling   All other systems reviewed and are negative.    Allergies  Lamictal and Eggs or egg-derived products  Home Medications   Current Outpatient Rx  Name Route Sig Dispense Refill  . ARIPIPRAZOLE 10 MG PO TABS Oral Take 10 mg by mouth daily.     . ASPIRIN 81 MG PO TABS Oral Take 81 mg by mouth daily.      [name] CLONAZEPAM 0.5 MG PO TABS Oral Take 0.5 mg by mouth daily as needed. For anxiety    . CLOPIDOGREL BISULFATE 75 MG PO TABS Oral Take 1 tablet (75 mg total) by mouth daily. 30 tablet 6  . DIVALPROEX SODIUM 500 MG  PO TBEC Oral Take 1,500 mg by mouth daily.      [name] ONE-DAILY MULTI VITAMINS PO TABS Oral Take 1 tablet by mouth daily.      [name] SIMVASTATIN 20 MG PO TABS  TAKE 1 TABLET BY MOUTH AT BEDTIME 30 tablet 5  . TRAZODONE HCL 50 MG PO TABS Oral Take 50 mg by mouth at bedtime.     . TRIAMTERENE-HCTZ 37.5-25 MG PO TABS Oral Take 1 tablet by mouth daily. 30 tablet 11  . NITROGLYCERIN 0.4 MG SL SUBL Sublingual Place 0.4 mg under the tongue every 5 (five) minutes as needed. For chest pain      BP 134/78  Pulse 88  Temp(Src) 98.3 F (36.8 C) (Oral)  Resp 20  Ht 5\' 11"  (1.803 m)  Wt 245 lb (111.131 kg)  BMI 34.17 kg/m2  SpO2 100%  Physical Exam  Nursing note and vitals reviewed. Constitutional: She is oriented to person, place, and time. She appears well-developed and well-nourished. No distress.  HENT:  Head: Normocephalic and atraumatic.  Eyes: EOM are normal.  Neck: Normal range of motion.  Cardiovascular: Normal rate, regular rhythm and normal heart sounds.   Pulmonary/Chest: Effort normal and breath sounds normal.  Abdominal: Soft. She exhibits no distension. There is no tenderness.  Musculoskeletal: Normal range of motion. She exhibits tenderness.       Left hand: She exhibits tenderness and swelling. She exhibits normal range of motion, no bony tenderness, normal capillary refill, no deformity and no laceration. normal sensation noted. Normal strength noted.       Hands: Neurological: She is alert and oriented to person, place, and time.  Skin: Skin is warm and dry.  Psychiatric: She has a normal mood and affect. Judgment normal.    ED Course  Procedures (including critical care time)  Labs Reviewed - No data to display No results found.   No diagnosis found.    MDM          Worthy Rancher, PA 01/14/11 [phone]

## 2011-01-15 NOTE — ED Provider Notes (Signed)
Medical screening examination/treatment/procedure(s) were performed by non-physician practitioner and as supervising physician I was immediately available for consultation/collaboration.   Hanley Seamen, MD 01/15/11 [phone]

## 2011-01-16 ENCOUNTER — Other Ambulatory Visit: Payer: Self-pay | Admitting: Cardiology

## 2011-02-15 ENCOUNTER — Ambulatory Visit (INDEPENDENT_AMBULATORY_CARE_PROVIDER_SITE_OTHER): Payer: BC Managed Care – PPO | Admitting: Family Medicine

## 2011-02-15 ENCOUNTER — Encounter: Payer: Self-pay | Admitting: Family Medicine

## 2011-02-15 ENCOUNTER — Encounter: Payer: Self-pay | Admitting: *Deleted

## 2011-02-15 DIAGNOSIS — B351 Tinea unguium: Secondary | ICD-10-CM

## 2011-02-15 DIAGNOSIS — Z0000 Encounter for general adult medical examination without abnormal findings: Secondary | ICD-10-CM | POA: Insufficient documentation

## 2011-02-15 DIAGNOSIS — I10 Essential (primary) hypertension: Secondary | ICD-10-CM

## 2011-02-15 DIAGNOSIS — E785 Hyperlipidemia, unspecified: Secondary | ICD-10-CM

## 2011-02-15 LAB — HEPATIC FUNCTION PANEL
Bilirubin, Direct: 0 mg/dL (ref 0.0–0.3)
Total Bilirubin: 0.7 mg/dL (ref 0.3–1.2)
Total Protein: 7 g/dL (ref 6.0–8.3)

## 2011-02-15 LAB — CBC WITH DIFFERENTIAL/PLATELET
Basophils Absolute: 0 10*3/uL (ref 0.0–0.1)
Eosinophils Absolute: 0.1 10*3/uL (ref 0.0–0.7)
HCT: 42 % (ref 36.0–46.0)
Hemoglobin: 14.4 g/dL (ref 12.0–15.0)
Lymphs Abs: 1 10*3/uL (ref 0.7–4.0)
MCHC: 34.3 g/dL (ref 30.0–36.0)
MCV: 89.7 fl (ref 78.0–100.0)
Neutro Abs: 3.4 10*3/uL (ref 1.4–7.7)
RDW: 14.1 % (ref 11.5–14.6)

## 2011-02-15 LAB — BASIC METABOLIC PANEL
BUN: 27 mg/dL — ABNORMAL HIGH (ref 6–23)
CO2: 28 mEq/L (ref 19–32)
Calcium: 9.5 mg/dL (ref 8.4–10.5)
Creatinine, Ser: 0.9 mg/dL (ref 0.4–1.2)

## 2011-02-15 LAB — TSH: TSH: 1.77 u[IU]/mL (ref 0.35–5.50)

## 2011-02-15 LAB — LIPID PANEL
Cholesterol: 150 mg/dL (ref 0–200)
LDL Cholesterol: 71 mg/dL (ref 0–99)
Triglycerides: 66 mg/dL (ref 0.0–149.0)

## 2011-02-15 NOTE — Assessment & Plan Note (Signed)
New.  Pt's great toes bilaterally w/ evidence of fungal infxn.  R toe w/ green hue.  No TTP.  Given odd color and extensive degree of infxn, will refer to podiatry.  Pt expressed understanding and is in agreement w/ plan.

## 2011-02-15 NOTE — Assessment & Plan Note (Signed)
Chronic problem, excellent control.  Asymptomatic.  No changes. 

## 2011-02-15 NOTE — Assessment & Plan Note (Signed)
Chronic problem.  Tolerating statin w/out difficulty.  Check labs.  Adjust meds prn  

## 2011-02-15 NOTE — Patient Instructions (Signed)
Follow up in 6 months to recheck BP and cholesterol You look great!  Keep up the good work! Someone will call you with your podiatry appt We'll notify you of your lab results Call with any questions or concerns Happy New Year!!!

## 2011-02-15 NOTE — Progress Notes (Signed)
  Subjective:    Patient ID: Kathleen [name], female    DOB: 10-Oct-1955, 56 y.o.   MRN: [id number]  HPI CPE- UTD on pap, mammo, colonoscopy.  Black toes- great toes bilaterally, first noticed 6 months ago.  sxs are worsening.  Starting to be painful.  No known trauma.  No new footwear.  HTN- chronic problem, on Triamterene HCTZ.  Doesn't recall ever being on beta blocker after MI.  No CP, SOB, HAs, visual changes, edema.  Hyperlipidemia- chronic problem, on Zocor.  No abd pain, N/V, myalgias.   Review of Systems Patient reports no vision/ hearing changes, adenopathy,fever, weight change,  persistant/recurrent hoarseness , swallowing issues, chest pain, palpitations, edema, persistant/recurrent cough, hemoptysis, dyspnea (rest/exertional/paroxysmal nocturnal), gastrointestinal bleeding (melena, rectal bleeding), abdominal pain, significant heartburn, bowel changes, GU symptoms (dysuria, hematuria, incontinence), Gyn symptoms (abnormal  bleeding, pain),  syncope, focal weakness, memory loss, numbness & tingling, skin/hair changes, anxiety, or depression.   + hematoma on L hand in December- went to ER for evaluation.    Objective:   Physical Exam General Appearance:    Alert, cooperative, no distress, appears stated age  Head:    Normocephalic, without obvious abnormality, atraumatic  Eyes:    PERRL, conjunctiva/corneas clear, EOM's intact, fundi    benign, both eyes  Ears:    Normal TM's and external ear canals, both ears  Nose:   Nares normal, septum midline, mucosa normal, no drainage    or sinus tenderness  Throat:   Lips, mucosa, and tongue normal; teeth and gums normal  Neck:   Supple, symmetrical, trachea midline, no adenopathy;    Thyroid: no enlargement/tenderness/nodules  Back:     Symmetric, no curvature, ROM normal, no CVA tenderness  Lungs:     Clear to auscultation bilaterally, respirations unlabored  Chest Wall:    No tenderness or deformity   Heart:    Regular rate and  rhythm, S1 and S2 normal, no murmur, rub   or gallop  Breast Exam:    Deferred to GYN  Abdomen:     Soft, non-tender, bowel sounds active all four quadrants,    no masses, no organomegaly  Genitalia:    Deferred to GYN  Rectal:    Extremities:   Extremities normal, atraumatic, no cyanosis or edema.  Bilateral great toes w/ evidence of fungal infection- R toe w/ green hue, not TTP  Pulses:   2+ and symmetric all extremities  Skin:   Skin color, texture, turgor normal, no rashes or lesions  Lymph nodes:   Cervical, supraclavicular, and axillary nodes normal  Neurologic:   CNII-XII intact, normal strength, sensation and reflexes    throughout          Assessment & Plan:

## 2011-02-15 NOTE — Assessment & Plan Note (Signed)
Pt UTD on health maintenance.  PE WNL w/ exception of nails.  Check labs.  Anticipatory guidance provided.

## 2011-02-18 LAB — VITAMIN D 1,25 DIHYDROXY: Vitamin D2 1, 25 (OH)2: <8 pg/mL

## 2011-02-20 ENCOUNTER — Encounter: Payer: Self-pay | Admitting: *Deleted

## 2011-03-30 ENCOUNTER — Telehealth: Payer: Self-pay | Admitting: Family Medicine

## 2011-03-30 MED ORDER — SIMVASTATIN 20 MG PO TABS: 20.0000 mg | ORAL_TABLET | Freq: Every day | ORAL | Status: DC

## 2011-03-30 NOTE — Telephone Encounter (Signed)
Simvastatin (Tab) ZOCOR 20 MG TAKE 1 TABLET BY MOUTH AT BEDTIME

## 2011-03-30 NOTE — Telephone Encounter (Signed)
rx sent to pharmacy by e-script  

## 2011-04-24 ENCOUNTER — Encounter (HOSPITAL_COMMUNITY): Payer: Self-pay | Admitting: *Deleted

## 2011-04-24 ENCOUNTER — Ambulatory Visit (HOSPITAL_COMMUNITY)
Admission: RE | Admit: 2011-04-24 | Discharge: 2011-04-24 | Disposition: A | Discharge status: Home / Self Care | Payer: BC Managed Care – PPO | Attending: Psychiatry | Admitting: Psychiatry

## 2011-04-24 ENCOUNTER — Telehealth (HOSPITAL_COMMUNITY): Payer: Self-pay | Admitting: *Deleted
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
## 2011-04-25 ENCOUNTER — Encounter (HOSPITAL_COMMUNITY): Payer: BC Managed Care – PPO | Attending: Psychiatry

## 2011-04-25 ENCOUNTER — Encounter (HOSPITAL_COMMUNITY): Payer: Self-pay

## 2011-04-25 DIAGNOSIS — I252 Old myocardial infarction: Secondary | ICD-10-CM | POA: Insufficient documentation

## 2011-04-25 DIAGNOSIS — IMO0002 Reserved for concepts with insufficient information to code with codable children: Secondary | ICD-10-CM | POA: Insufficient documentation

## 2011-04-25 DIAGNOSIS — I2510 Atherosclerotic heart disease of native coronary artery without angina pectoris: Secondary | ICD-10-CM | POA: Insufficient documentation

## 2011-04-25 DIAGNOSIS — J45909 Unspecified asthma, uncomplicated: Secondary | ICD-10-CM | POA: Insufficient documentation

## 2011-04-25 DIAGNOSIS — I10 Essential (primary) hypertension: Secondary | ICD-10-CM | POA: Insufficient documentation

## 2011-04-25 DIAGNOSIS — F3130 Bipolar disorder, current episode depressed, mild or moderate severity, unspecified: Secondary | ICD-10-CM | POA: Insufficient documentation

## 2011-04-25 DIAGNOSIS — Z8673 Personal history of transient ischemic attack (TIA), and cerebral infarction without residual deficits: Secondary | ICD-10-CM | POA: Insufficient documentation

## 2011-04-25 DIAGNOSIS — F411 Generalized anxiety disorder: Secondary | ICD-10-CM | POA: Insufficient documentation
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
## 2011-04-25 NOTE — Progress Notes (Signed)
    Daily Group Progress Note  Program: IOP  Group Time: 9:00-10:30 am   Participation Level: Active  Behavioral Response: Appropriate  Type of Therapy:  Process Group  Summary of Progress: Today was patients first day in the group. She appeared nervous and reported feeling "overwhelmed".      Group Time: 10:30 am - 12:00 pm  Participation Level:  Active  Behavioral Response: Appropriate  Type of Therapy: Psycho-education Group  Summary of Progress: Patient participated in a relaxation technique to mange stress (PMR) and identified stress levels both before and after the activity to identify the level of stress reduction. Patient then participated in a discussion on how to use this technique for ongoing stress management.   Maxcine Ham, MSW, LCSW

## 2011-04-26 ENCOUNTER — Encounter (HOSPITAL_COMMUNITY): Payer: BC Managed Care – PPO

## 2011-04-27 ENCOUNTER — Encounter (HOSPITAL_COMMUNITY): Payer: BC Managed Care – PPO

## 2011-04-27 ENCOUNTER — Encounter (HOSPITAL_COMMUNITY): Payer: Self-pay

## 2011-04-27 NOTE — Progress Notes (Signed)
    Daily Group Progress Note  Program: IOP  Group Time: 9:00-10:30 am   Participation Level: Active  Behavioral Response: Appropriate  Type of Therapy:  Psycho-education Group  Summary of Progress: Patient participated in an educational presentation by Cindy Loving with [employer] on how to use scents to manage depression, anxiety and improve overall wellbeing.          Group Time: 10:30 am - 12:00 pm  Participation Level:  Active  Behavioral Response: Appropriate  Type of Therapy: Psycho-education Group  Summary of Progress: Patient continued discussion regarding Aromatherapy for mood stabilization and was able to ask specific questions and experiment with scents to and create a personalized plan for their own wellness.     Rosaland Shiffman, MSW, LCSW  
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
## 2011-04-28 ENCOUNTER — Encounter (HOSPITAL_COMMUNITY): Payer: BC Managed Care – PPO
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
## 2011-05-01 ENCOUNTER — Encounter (HOSPITAL_COMMUNITY): Payer: BC Managed Care – PPO

## 2011-05-01 NOTE — Progress Notes (Signed)
    Daily Group Progress Note  Program: IOP  Group Time: Maxcine Ham, MSW, LCSW   Participation Level: Active  Behavioral Response: Appropriate  Type of Therapy:  Process Group  Summary of Progress: Patient reports improvement in mood with less feelings of "hopelessness". She described feeling better after spending time over the weekend with her youngest daughter. She described how not having better relationships with her three daughters and no contact at all with her two grandchildren as a trigger for her depression. She identified occasional thoughts of suicide when she feels overwhelmed, but denied suicidal thoughts today with a plan and states "she just wants to learn how to make the pain stop." Patient is starting to share more and open up to other members.      Group Time: 10:30 am - 12:00 pm   Participation Level:  Active  Behavioral Response: Appropriate  Type of Therapy: Grief and Loss  Summary of Progress: Patient participated in a grief and loss group facilitated by Theda Belfast, chaplin with [facility], and identified losses impacting overall wellness.   Maxcine Ham, MSW, LCSW

## 2011-05-02 ENCOUNTER — Encounter (HOSPITAL_COMMUNITY): Payer: BC Managed Care – PPO

## 2011-05-02 MED ORDER — ESCITALOPRAM OXALATE 20 MG PO TABS: 20.0000 mg | ORAL_TABLET | Freq: Every day | ORAL | Status: DC

## 2011-05-02 NOTE — Progress Notes (Signed)
    Daily Group Progress Note  Program: IOP  Group Time: 9:00-10:30 am   Participation Level: Active  Behavioral Response: Appropriate  Type of Therapy:  Process Group  Summary of Progress: Patient reports improved mood and decreased depression. She shared feelings of "guilt" associated with mistakes she feels she made in parenting her children and how this impacted their estranged relationship. 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 Patient is identifying she is not where she wants to be in life and got there by putting others needs before her own.      Group Time: 10:30 am - 12:00 pm   Participation Level:  Active  Behavioral Response: Appropriate  Type of Therapy: Psycho-education Group  Summary of Progress:  patient was introduced to the concept of using healthy boundaries to assert needs and manage depression and identified barriers to setting healthy limits.   Maxcine Ham, MSW, LCSW

## 2011-05-02 NOTE — Progress Notes (Signed)
Patient ID: Kathleen [name], female   DOB: 16-Apr-1955, 56 y.o.   MRN: [id number] Pt seen with [name], states has disturbed sleep, app-good. Mood-has been having mood swings, also feels depressed. No si/ hi. No hallu/ delusions.  Discussed R/R/B/O of lexapro and pt gave informed consent.start Lexapro 10 mg po q pm.

## 2011-05-03 ENCOUNTER — Encounter (HOSPITAL_COMMUNITY): Payer: BC Managed Care – PPO
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
## 2011-05-04 ENCOUNTER — Encounter (HOSPITAL_COMMUNITY): Payer: BC Managed Care – PPO

## 2011-05-04 NOTE — Progress Notes (Signed)
    Daily Group Progress Note  Program: IOP  Group Time: 9:00-10:30 am   Participation Level: Minimal  Behavioral Response: Appropriate  Type of Therapy:  Process Group  Summary of Progress: Patient reports high depression, but appeared disengaged and did not share or participate.      Group Time: 10:30 am - 12:00 pm   Participation Level:  Active  Behavioral Response: Appropriate  Type of Therapy: Psycho-education Group  Summary of Progress: Preparing for Discharge. Patient identified a personalized plan to get ready to end the program and continuing getting support for mood stability.   Maxcine Ham, MSW, LCSW

## 2011-05-05 ENCOUNTER — Encounter (HOSPITAL_COMMUNITY): Payer: BC Managed Care – PPO

## 2011-05-05 NOTE — Progress Notes (Signed)
    Daily Group Progress Note  Program: IOP  Group Time: 9:00-10:30 am   Participation Level: Minimal  Behavioral Response: Appropriate  Type of Therapy:  Process Group  Summary of Progress: Patient has not shared in several days and observes others. She states she struggles to allow herself permission to feel and release feelings. She worries about ending the group on Monday and returning to work. She has not really opened herself up to the group during her time in the program.      Group Time: 10:30 am - 12:00 pm    Participation Level:  Active  Behavioral Response: Appropriate  Type of Therapy: Psycho-education Group  Summary of Progress: Patient learned about support groups in the community to access for continued support with depression following discharge.   Maxcine Ham, MSW, LCSW

## 2011-05-08 ENCOUNTER — Encounter (HOSPITAL_COMMUNITY): Payer: BC Managed Care – PPO

## 2011-05-08 NOTE — Progress Notes (Signed)
Patient ID: Kathleen [name], female   DOB: [birth year], 56 y.o.   MRN: [id number] D:  Patient completed MH-IOP today.  Denies any SI/HI, A/V Hallucinations.  Reports decreased sleep and motivation.  Appetite good, fair concentration.  Admits to continued depression.  Scheduled to RTW tomorrow.  States she is anxious about returning to work.  A:  D/C today.  F/U with Drs. Nolen Mu and Andrey Campanile.  R:  Pt receptive.

## 2011-05-08 NOTE — Progress Notes (Signed)
    Daily Group Progress Note  Program: IOP  Group Time: 9:00-10:30 am   Participation Level: Active  Behavioral Response: Appropriate  Type of Therapy:  Process Group  Summary of Progress: Today is patients last day in the group. Members encouraged her to share and described their frustration with her not opening up more in the group. Members stated they did "not really know her" because she did not participate. She was given thirty minutes to share and did not take the opportunity. She stated it is hard for her to allow herself permission to share and to "feel". She did become tearful during the goodbye ceremony where she received positive feedback from others. She also appears less withdrawn and trying to be more inclusive with the group by sitting closer to them. She states she wishes she would have participated more.      Group Time: 10:30 am - 12:00 pm   Participation Level:  Active  Behavioral Response: Appropriate  Type of Therapy: Psycho-education Group  Summary of Progress: Patient learned an anxiety management technique called EASE and practiced feeling identification and practicing breathing in the feeling of ease and calm while breathing to reduce anxiety feelings.   Maxcine Ham, MSW, LCSW

## 2011-05-08 NOTE — Patient Instructions (Signed)
Patient completed MH-IOP today.  Denies any SI.  Will follow up with Dr. Ollen Gross on 05-15-11 and Dr. Andee Poles on 05-16-11.  Pt is scheduled to return to work tomorrow.  Encouraged support groups.
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
## 2011-05-09 ENCOUNTER — Encounter (HOSPITAL_COMMUNITY): Payer: BC Managed Care – PPO

## 2011-05-10 ENCOUNTER — Encounter (HOSPITAL_COMMUNITY): Payer: BC Managed Care – PPO

## 2011-05-11 ENCOUNTER — Encounter (HOSPITAL_COMMUNITY): Payer: BC Managed Care – PPO

## 2011-05-12 ENCOUNTER — Encounter (HOSPITAL_COMMUNITY): Payer: BC Managed Care – PPO

## 2011-05-15 ENCOUNTER — Encounter (HOSPITAL_COMMUNITY): Payer: BC Managed Care – PPO

## 2011-05-16 ENCOUNTER — Encounter (HOSPITAL_COMMUNITY): Payer: BC Managed Care – PPO

## 2011-05-17 ENCOUNTER — Encounter (HOSPITAL_COMMUNITY): Payer: BC Managed Care – PPO

## 2011-05-18 ENCOUNTER — Encounter (HOSPITAL_COMMUNITY): Payer: BC Managed Care – PPO

## 2011-05-19 ENCOUNTER — Encounter (HOSPITAL_COMMUNITY): Payer: BC Managed Care – PPO

## 2011-05-22 ENCOUNTER — Encounter (HOSPITAL_COMMUNITY): Payer: BC Managed Care – PPO

## 2011-05-23 ENCOUNTER — Encounter (HOSPITAL_COMMUNITY): Payer: BC Managed Care – PPO

## 2011-05-24 ENCOUNTER — Encounter (HOSPITAL_COMMUNITY): Payer: BC Managed Care – PPO

## 2011-05-25 ENCOUNTER — Encounter (HOSPITAL_COMMUNITY): Payer: BC Managed Care – PPO

## 2011-05-26 ENCOUNTER — Encounter (HOSPITAL_COMMUNITY): Payer: BC Managed Care – PPO

## 2011-05-26 ENCOUNTER — Other Ambulatory Visit: Payer: Self-pay | Admitting: Cardiology

## 2011-05-26 NOTE — Telephone Encounter (Signed)
Refilled plavix 

## 2011-05-29 ENCOUNTER — Telehealth: Payer: Self-pay | Admitting: Family Medicine

## 2011-05-29 ENCOUNTER — Encounter (HOSPITAL_COMMUNITY): Payer: BC Managed Care – PPO

## 2011-05-29 NOTE — Telephone Encounter (Signed)
Refill: Simvastatin 20mg  tablet. 90 day supply

## 2011-05-30 ENCOUNTER — Other Ambulatory Visit: Payer: Self-pay | Admitting: *Deleted

## 2011-05-30 MED ORDER — SIMVASTATIN 20 MG PO TABS: 20.0000 mg | ORAL_TABLET | Freq: Every day | ORAL | Status: DC

## 2011-05-30 MED ORDER — TRIAMTERENE-HCTZ 37.5-25 MG PO TABS: 1.0000 | ORAL_TABLET | Freq: Every day | ORAL | Status: DC

## 2011-05-30 MED ORDER — CLOPIDOGREL BISULFATE 75 MG PO TABS: 75.0000 mg | ORAL_TABLET | Freq: Every day | ORAL | Status: DC

## 2011-05-30 NOTE — Telephone Encounter (Signed)
rx sent to pharmacy by e-script  

## 2011-07-24 ENCOUNTER — Ambulatory Visit (INDEPENDENT_AMBULATORY_CARE_PROVIDER_SITE_OTHER): Payer: BC Managed Care – PPO | Admitting: Family Medicine

## 2011-07-24 ENCOUNTER — Encounter: Payer: Self-pay | Admitting: Family Medicine

## 2011-07-24 ENCOUNTER — Ambulatory Visit (HOSPITAL_BASED_OUTPATIENT_CLINIC_OR_DEPARTMENT_OTHER)
Admission: RE | Admit: 2011-07-24 | Discharge: 2011-07-24 | Disposition: A | Discharge status: Home / Self Care | Payer: BC Managed Care – PPO | Source: Ambulatory Visit | Attending: Family Medicine | Admitting: Family Medicine

## 2011-07-24 ENCOUNTER — Encounter: Payer: Self-pay | Admitting: *Deleted

## 2011-07-24 VITALS — BP 121/78 | HR 87 | Temp 98.3°F | Ht 70.75 in | Wt 265.8 lb

## 2011-07-24 DIAGNOSIS — R1011 Right upper quadrant pain: Secondary | ICD-10-CM

## 2011-07-24 LAB — HEPATIC FUNCTION PANEL
ALT: 32 U/L (ref 0–35)
AST: 28 U/L (ref 0–37)
Albumin: 4 g/dL (ref 3.5–5.2)

## 2011-07-24 LAB — CBC WITH DIFFERENTIAL/PLATELET
Basophils Absolute: 0 10*3/uL (ref 0.0–0.1)
Eosinophils Relative: 1.6 % (ref 0.0–5.0)
HCT: 41.7 % (ref 36.0–46.0)
Hemoglobin: 14.1 g/dL (ref 12.0–15.0)
Lymphs Abs: 1.1 10*3/uL (ref 0.7–4.0)
MCV: 89.5 fl (ref 78.0–100.0)
Monocytes Absolute: 0.3 10*3/uL (ref 0.1–1.0)
Monocytes Relative: 7.4 % (ref 3.0–12.0)
Neutro Abs: 2.4 10*3/uL (ref 1.4–7.7)
Platelets: 149 10*3/uL — ABNORMAL LOW (ref 150.0–400.0)
RDW: 13.7 % (ref 11.5–14.6)

## 2011-07-24 LAB — BASIC METABOLIC PANEL
CO2: 29 mEq/L (ref 19–32)
Calcium: 9.9 mg/dL (ref 8.4–10.5)
Creatinine, Ser: 0.9 mg/dL (ref 0.4–1.2)
GFR: 68.88 mL/min (ref >60.00)
Glucose, Bld: 94 mg/dL (ref 70–99)

## 2011-07-24 NOTE — Progress Notes (Signed)
  Subjective:    Patient ID: Kathleen [name], female    DOB: 07-Feb-1955, 56 y.o.   MRN: [id number]  HPI RUQ pain- had episode last week while at the beach.  Has had multiple episodes over the last few months but 'last week it doubled me over'.  No nausea.  Pain is described as a 'stabbing'.  No radiation.  Most recent episode lasted 'a couple of days'.  Nothing improved pain.  Nothing worsened pain.  No relation to food.  No change w/ belching, passing gas, or BM.  Was sitting and reading when pain started.   Review of Systems For ROS see HPI     Objective:   Physical Exam  Vitals reviewed. Constitutional: She appears well-developed and well-nourished. No distress.  Neck: Normal range of motion. Neck supple.  Cardiovascular: Normal rate, regular rhythm and normal heart sounds.   Pulmonary/Chest: Effort normal and breath sounds normal. No respiratory distress. She has no wheezes. She has no rales.  Abdominal: Soft. Bowel sounds are normal. She exhibits distension. There is tenderness (mild RUQ tenderness). There is no rebound and no guarding.  Lymphadenopathy:    She has no cervical adenopathy.          Assessment & Plan:

## 2011-07-24 NOTE — Patient Instructions (Addendum)
We'll notify you of your lab results and [place] and determine the next steps Call with any questions or concerns Hang in there!!!

## 2011-08-15 NOTE — Assessment & Plan Note (Signed)
New.  Pt currently not having sxs but last week sxs were severe and she has had this previously.  Check labs to r/o infxn, liver abnormality or electrolyte imbalance.  Get [place] to r/o gallstones or other abnormality.  Will determine tx based on these results.  Reviewed supportive care and red flags that should prompt return.  Pt expressed understanding and is in agreement w/ plan.

## 2011-09-12 ENCOUNTER — Encounter: Payer: Self-pay | Admitting: Family Medicine

## 2011-09-12 ENCOUNTER — Ambulatory Visit (INDEPENDENT_AMBULATORY_CARE_PROVIDER_SITE_OTHER): Payer: BC Managed Care – PPO | Admitting: Family Medicine

## 2011-09-12 VITALS — BP 118/70 | HR 85 | Temp 98.1°F | Wt 278.6 lb

## 2011-09-12 DIAGNOSIS — M549 Dorsalgia, unspecified: Secondary | ICD-10-CM

## 2011-09-12 MED ORDER — CYCLOBENZAPRINE HCL 10 MG PO TABS: 10.0000 mg | ORAL_TABLET | Freq: Three times a day (TID) | ORAL | Status: AC | PRN

## 2011-09-12 MED ORDER — HYDROCODONE-ACETAMINOPHEN 5-300 MG PO TABS: ORAL_TABLET | ORAL | Status: DC

## 2011-09-12 NOTE — Progress Notes (Signed)
  Subjective:    Kathleen [name] is a 56 y.o. female who presents for evaluation of low back pain. The patient has had no prior back problems. Symptoms have been present for 10 days and are unchanged.  Onset was related to / precipitated by bending over to pick up a laundry basket. The pain is located in the across the lower back and does not radiate. The pain is described as sharp and occurs all day. She rates her pain as severe. Symptoms are exacerbated by sitting, standing and walking. Symptoms are improved by nothing. She has also tried nothing which provided no symptom relief. She has no other symptoms associated with the back pain. The patient has no "red flag" history indicative of complicated back pain.  The following portions of the patient's history were reviewed and updated as appropriate: allergies, current medications, past family history, past medical history, past social history, past surgical history and problem list.  Review of Systems Pertinent items are noted in HPI.    Objective:   Normal reflexes, gait, strength and negative straight-leg raise. Muscle tone and ROM exam: muscle spasm noted low back.    Assessment:    Nonspecific acute low back pain    Plan:    Regular aerobic and trunk strengthening exercises discussed. Short (2-4 day) period of relative rest recommended until acute symptoms improve. Ice to affected area as needed for local pain relief. Heat to affected area as needed for local pain relief. Muscle relaxants per medication orders. Follow-up in 2 weeks. --or sooner prn

## 2011-09-12 NOTE — Patient Instructions (Signed)
Back Pain, Adult Low back pain is very common. About 1 in 5 people have back pain.The cause of low back pain is rarely dangerous. The pain often gets better over time.About half of people with a sudden onset of back pain feel better in just 2 weeks. About 8 in 10 people feel better by 6 weeks.  CAUSES Some common causes of back pain include:  Strain of the muscles or ligaments supporting the spine.   Wear and tear (degeneration) of the spinal discs.   Arthritis.   Direct injury to the back.  DIAGNOSIS Most of the time, the direct cause of low back pain is not known.However, back pain can be treated effectively even when the exact cause of the pain is unknown.Answering your caregiver's questions about your overall health and symptoms is one of the most accurate ways to make sure the cause of your pain is not dangerous. If your caregiver needs more information, he or she may order lab work or imaging tests (X-rays or MRIs).However, even if imaging tests show changes in your back, this usually does not require surgery. HOME CARE INSTRUCTIONS For many people, back pain returns.Since low back pain is rarely dangerous, it is often a condition that people can learn to manageon their own.   Remain active. It is stressful on the back to sit or stand in one place. Do not sit, drive, or stand in one place for more than 30 minutes at a time. Take short walks on level surfaces as soon as pain allows.Try to increase the length of time you walk each day.   Do not stay in bed.Resting more than 1 or 2 days can delay your recovery.   Do not avoid exercise or work.Your body is made to move.It is not dangerous to be active, even though your back may hurt.Your back will likely heal faster if you return to being active before your pain is gone.   Pay attention to your body when you bend and lift. Many people have less discomfortwhen lifting if they bend their knees, keep the load close to their  bodies,and avoid twisting. Often, the most comfortable positions are those that put less stress on your recovering back.   Find a comfortable position to sleep. Use a firm mattress and lie on your side with your knees slightly bent. If you lie on your back, put a pillow under your knees.   Only take over-the-counter or prescription medicines as directed by your caregiver. Over-the-counter medicines to reduce pain and inflammation are often the most helpful.Your caregiver may prescribe muscle relaxant drugs.These medicines help dull your pain so you can more quickly return to your normal activities and healthy exercise.   Put ice on the injured area.   Put ice in a plastic bag.   Place a towel between your skin and the bag.   Leave the ice on for 15 to 20 minutes, 3 to 4 times a day for the first 2 to 3 days. After that, ice and heat may be alternated to reduce pain and spasms.   Ask your caregiver about trying back exercises and gentle massage. This may be of some benefit.   Avoid feeling anxious or stressed.Stress increases muscle tension and can worsen back pain.It is important to recognize when you are anxious or stressed and learn ways to manage it.Exercise is a great option.  SEEK MEDICAL CARE IF:  You have pain that is not relieved with rest or medicine.   You have   pain that does not improve in 1 week.   You have new symptoms.   You are generally not feeling well.  SEEK IMMEDIATE MEDICAL CARE IF:   You have pain that radiates from your back into your legs.   You develop new bowel or bladder control problems.   You have unusual weakness or numbness in your arms or legs.   You develop nausea or vomiting.   You develop abdominal pain.   You feel faint.  Document Released: 01/09/2005 Document Revised: 12/29/2010 Document Reviewed: 05/30/2010 [employer] Patient Information 2012 [employer]. 

## 2011-10-16 ENCOUNTER — Ambulatory Visit (INDEPENDENT_AMBULATORY_CARE_PROVIDER_SITE_OTHER)
Admission: RE | Admit: 2011-10-16 | Discharge: 2011-10-16 | Disposition: A | Discharge status: Home / Self Care | Payer: BC Managed Care – PPO | Source: Ambulatory Visit | Attending: Family Medicine | Admitting: Family Medicine

## 2011-10-16 DIAGNOSIS — M549 Dorsalgia, unspecified: Secondary | ICD-10-CM

## 2011-12-06 ENCOUNTER — Observation Stay (HOSPITAL_BASED_OUTPATIENT_CLINIC_OR_DEPARTMENT_OTHER)
Admission: EM | Admit: 2011-12-06 | Discharge: 2011-12-07 | Disposition: A | Discharge status: Home / Self Care | Payer: BC Managed Care – PPO | Attending: Cardiology | Admitting: Cardiology

## 2011-12-06 ENCOUNTER — Emergency Department (HOSPITAL_BASED_OUTPATIENT_CLINIC_OR_DEPARTMENT_OTHER): Payer: BC Managed Care – PPO

## 2011-12-06 ENCOUNTER — Encounter (HOSPITAL_BASED_OUTPATIENT_CLINIC_OR_DEPARTMENT_OTHER): Payer: Self-pay | Admitting: *Deleted

## 2011-12-06 DIAGNOSIS — R079 Chest pain, unspecified: Principal | ICD-10-CM

## 2011-12-06 DIAGNOSIS — R0789 Other chest pain: Secondary | ICD-10-CM

## 2011-12-06 DIAGNOSIS — F411 Generalized anxiety disorder: Secondary | ICD-10-CM | POA: Insufficient documentation

## 2011-12-06 DIAGNOSIS — R110 Nausea: Secondary | ICD-10-CM | POA: Insufficient documentation

## 2011-12-06 DIAGNOSIS — R0602 Shortness of breath: Secondary | ICD-10-CM | POA: Insufficient documentation

## 2011-12-06 DIAGNOSIS — E669 Obesity, unspecified: Secondary | ICD-10-CM

## 2011-12-06 DIAGNOSIS — I2510 Atherosclerotic heart disease of native coronary artery without angina pectoris: Secondary | ICD-10-CM

## 2011-12-06 DIAGNOSIS — M549 Dorsalgia, unspecified: Secondary | ICD-10-CM

## 2011-12-06 DIAGNOSIS — Z86718 Personal history of other venous thrombosis and embolism: Secondary | ICD-10-CM | POA: Insufficient documentation

## 2011-12-06 DIAGNOSIS — I252 Old myocardial infarction: Secondary | ICD-10-CM

## 2011-12-06 DIAGNOSIS — E785 Hyperlipidemia, unspecified: Secondary | ICD-10-CM | POA: Insufficient documentation

## 2011-12-06 DIAGNOSIS — F329 Major depressive disorder, single episode, unspecified: Secondary | ICD-10-CM | POA: Insufficient documentation

## 2011-12-06 DIAGNOSIS — F3289 Other specified depressive episodes: Secondary | ICD-10-CM | POA: Insufficient documentation

## 2011-12-06 DIAGNOSIS — J45909 Unspecified asthma, uncomplicated: Secondary | ICD-10-CM | POA: Insufficient documentation

## 2011-12-06 DIAGNOSIS — Z79899 Other long term (current) drug therapy: Secondary | ICD-10-CM | POA: Insufficient documentation

## 2011-12-06 DIAGNOSIS — I10 Essential (primary) hypertension: Secondary | ICD-10-CM

## 2011-12-06 DIAGNOSIS — Z7902 Long term (current) use of antithrombotics/antiplatelets: Secondary | ICD-10-CM | POA: Insufficient documentation

## 2011-12-06 LAB — CBC WITH DIFFERENTIAL/PLATELET
Eosinophils Absolute: 0.1 10*3/uL (ref 0.0–0.7)
Eosinophils Relative: 2 % (ref 0–5)
HCT: 38.2 % (ref 36.0–46.0)
Hemoglobin: 13.1 g/dL (ref 12.0–15.0)
Lymphocytes Relative: 21 % (ref 12–46)
Lymphs Abs: 1.5 10*3/uL (ref 0.7–4.0)
MCH: 29.2 pg (ref 26.0–34.0)
MCV: 85.3 fL (ref 78.0–100.0)
Monocytes Absolute: 0.5 10*3/uL (ref 0.1–1.0)
Monocytes Relative: 7 % (ref 3–12)
Platelets: 152 10*3/uL (ref 150–400)
RBC: 4.48 MIL/uL (ref 3.87–5.11)
WBC: 7 10*3/uL (ref 4.0–10.5)

## 2011-12-06 LAB — COMPREHENSIVE METABOLIC PANEL
ALT: 16 U/L (ref 0–35)
BUN: 19 mg/dL (ref 6–23)
CO2: 27 mEq/L (ref 19–32)
Calcium: 9.9 mg/dL (ref 8.4–10.5)
Creatinine, Ser: 0.9 mg/dL (ref 0.50–1.10)
GFR calc Af Amer: 81 mL/min — ABNORMAL LOW (ref >90)
GFR calc non Af Amer: 70 mL/min — ABNORMAL LOW (ref >90)
Glucose, Bld: 99 mg/dL (ref 70–99)
Total Protein: 7 g/dL (ref 6.0–8.3)

## 2011-12-06 LAB — D-DIMER, QUANTITATIVE: D-Dimer, Quant: 0.69 ug/mL-FEU — ABNORMAL HIGH (ref 0.00–0.48)

## 2011-12-06 MED ORDER — NITROGLYCERIN 2 % TD OINT
1.0000 [in_us] | TOPICAL_OINTMENT | Freq: Once | TRANSDERMAL | Status: AC
  Administered 2011-12-06: 1 [in_us] via TOPICAL
  Filled 2011-12-06: qty 1

## 2011-12-06 MED ORDER — IOHEXOL 350 MG/ML SOLN
100.0000 mL | Freq: Once | INTRAVENOUS | Status: AC | PRN
  Administered 2011-12-06: 100 mL via INTRAVENOUS

## 2011-12-06 MED ORDER — MORPHINE SULFATE 2 MG/ML IJ SOLN
2.0000 mg | Freq: Once | INTRAMUSCULAR | Status: AC
  Administered 2011-12-06: 2 mg via INTRAVENOUS

## 2011-12-06 MED ORDER — MORPHINE SULFATE 2 MG/ML IJ SOLN
INTRAMUSCULAR | Status: AC
  Administered 2011-12-06: 2 mg via INTRAVENOUS
  Filled 2011-12-06: qty 1

## 2011-12-06 MED ORDER — MORPHINE SULFATE 2 MG/ML IJ SOLN
2.0000 mg | Freq: Once | INTRAMUSCULAR | Status: AC
  Administered 2011-12-06: 2 mg via INTRAVENOUS
  Filled 2011-12-06: qty 1

## 2011-12-06 NOTE — ED Notes (Signed)
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

## 2011-12-06 NOTE — ED Provider Notes (Signed)
History     CSN: [id number]  Arrival date & time 12/06/11  Kathleen [name]   First MD Initiated Contact with Patient 12/06/11 1923      Chief Complaint  Patient presents with  . 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 +mild associated SOB. No cough, fever, chills, lower ext pain or swelling. Pt took 325 mg asa prior to arrival with mild improvement. States she is out of her NTG. Pain feel similar to MI in '09.  Past Medical History  Diagnosis Date  . 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 Hypertension   . Depression   . Lower extremity deep venous thrombosis   . Dyslipidemia   . Psychiatric hospitalization 05/2008  . Asthma   . Coronary artery disease   . Anxiety     Past Surgical History  Procedure Date  . Breast mass excision     ae 19, benign tumor  . Knee surgery     right, removed cartilage  . Tubal ligation   . Tonsillectomy     Family History  Problem Relation Age of Onset  . Breast cancer Mother 35  . Alcohol abuse Mother   . Alcohol abuse Father   . Alcohol abuse Maternal Grandfather   . Drug abuse Maternal Grandfather   . Alcohol abuse Maternal Grandmother   . Alcohol abuse Paternal Grandfather   . Alcohol abuse Paternal Grandmother   . Schizophrenia Cousin     History  Substance Use Topics  . Smoking status: Never Smoker   . Smokeless tobacco: Never Used  . Alcohol Use: 0.0 oz/week    0 Glasses of wine per week     Comment: occ.    OB History    Grav Para Term Preterm Abortions TAB SAB Ect Mult Living                  Review of Systems  Constitutional: Negative for fever and chills.  Respiratory: Positive for shortness of breath. Negative for cough, chest tightness and wheezing.   Cardiovascular: Positive for chest pain. Negative for palpitations and leg  swelling.  Gastrointestinal: Positive for nausea. Negative for vomiting and abdominal pain.  Musculoskeletal: Negative for myalgias and back pain.  Skin: Negative for rash and wound.  Neurological: Negative for dizziness, weakness, light-headedness, numbness and headaches.    Allergies  Lamictal and Eggs or egg-derived products  Home Medications   Current Outpatient Rx  Name  Route  Sig  Dispense  Refill  . ARIPIPRAZOLE 10 MG PO TABS   Oral   Take 10 mg by mouth daily.          . ASPIRIN 81 MG PO TABS   Oral   Take 81 mg by mouth daily.           [name] CLONAZEPAM 0.5 MG PO TABS   Oral   Take 0.5 mg by mouth daily as needed. For anxiety         . CLOPIDOGREL BISULFATE 75 MG PO TABS   Oral   Take 1 tablet (75 mg total) by mouth daily.   90 tablet   2   . DIVALPROEX SODIUM 500 MG PO TBEC   Oral   Take 1,500 mg by mouth daily.           [name] HYDROCODONE-ACETAMINOPHEN 5-300 MG PO TABS  1 po q6h prn pain   30 each   0   . LURASIDONE HCL 40 MG PO TABS   Oral   Take 40 mg by mouth daily with breakfast.         . ONE-DAILY MULTI VITAMINS PO TABS   Oral   Take 1 tablet by mouth daily.           [name] NITROGLYCERIN 0.4 MG SL SUBL   Sublingual   Place 0.4 mg under the tongue every 5 (five) minutes as needed. For chest pain         . SERTRALINE HCL 50 MG PO TABS               . SIMVASTATIN 20 MG PO TABS   Oral   Take 1 tablet (20 mg total) by mouth at bedtime.   30 tablet   3   . TRAZODONE HCL 50 MG PO TABS   Oral   Take 50 mg by mouth at bedtime.          . TRIAMTERENE-HCTZ 37.5-25 MG PO TABS   Oral   Take 1 each (1 tablet total) by mouth daily.   90 tablet   2     BP 163/81  Pulse 88  Temp 99.3 F (37.4 C) (Oral)  Resp 18  Ht 5\' 11"  (1.803 m)  Wt 276 lb (125.193 kg)  BMI 38.49 kg/m2  SpO2 100%  Physical Exam  Nursing note and vitals reviewed. Constitutional: She is oriented to person, place, and time. She appears well-developed  and well-nourished. No distress.  HENT:  Head: Normocephalic and atraumatic.  Mouth/Throat: Oropharynx is clear and moist.  Eyes: EOM are normal. Pupils are equal, round, and reactive to light.  Neck: Normal range of motion. Neck supple.  Cardiovascular: Normal rate and regular rhythm.  Exam reveals no gallop and no friction rub.   Murmur heard. Pulmonary/Chest: Effort normal and breath sounds normal. No respiratory distress. She has no wheezes. She has no rales. She exhibits no tenderness.  Abdominal: Soft. Bowel sounds are normal. She exhibits no mass. There is no tenderness. There is no rebound and no guarding.  Musculoskeletal: Normal range of motion. She exhibits edema (1+ bl pedal edema). She exhibits no tenderness.  Neurological: She is alert and oriented to person, place, and time.       5/5 motor in all ext. Sensation intact  Skin: Skin is warm and dry. No rash noted. No erythema.  Psychiatric:       Flat affect    ED Course  Procedures (including critical care time)  Labs Reviewed  COMPREHENSIVE METABOLIC PANEL - Abnormal; Notable for the following:    Potassium 3.4 (*)     GFR calc non Af Amer 70 (*)     GFR calc Af Amer 81 (*)     All other components within normal limits  D-DIMER, QUANTITATIVE - Abnormal; Notable for the following:    D-Dimer, Quant 0.69 (*)     All other components within normal limits  TROPONIN I  CBC WITH DIFFERENTIAL   Dg Chest 2 View  12/06/2011  *RADIOLOGY REPORT*  Clinical Data: Chest pain and shortness of breath.  CHEST - 2 VIEW  Comparison: CT chest 05/21/2008.  Findings: The heart size is normal.  Linear scarring at the left lung base is stable.  Mild interstitial coarsening is stable.  No focal airspace disease is evident.  Mild degenerative changes of the thoracic spine are stable.  IMPRESSION:  1.  No acute cardiopulmonary disease or significant interval change. 2.  Mild interstitial coarsening is chronic.   Original Report Authenticated  By: Marin Roberts, M.D.    Ct Angio Chest W/cm &/or Wo Cm  12/06/2011  *RADIOLOGY REPORT*  Clinical Data: Chest pain  CT ANGIOGRAPHY CHEST  Technique:  Multidetector CT imaging of the chest using the standard protocol during bolus administration of intravenous contrast. Multiplanar reconstructed images including MIPs were obtained and reviewed to evaluate the vascular anatomy.  Contrast: OMNIPAQUE IOHEXOL 350 MG/ML SOLN  Comparison: 05/21/2008  Findings: Lungs/pleura: There is no pleural effusion.  No airspace consolidation identified.  There is a pulmonary nodule in the left upper lobe which measures 3.8 mm, image 39.  Likely present on the previous exam.  Heart/Mediastinum: Normal heart size. No pericardial effusion No mediastinal or hilar adenopathy.  Calcified right hilar lymph nodes are identified.  The pulmonary arteries are patent.  There is no evidence for acute embolus.  Upper abdomen: No acute findings identified.  There is a cyst arising from the upper pole of the left kidney.  Only partially visualized measuring 8 mm.  Bones/Musculoskeletal:  Multilevel spondylosis noted within the thoracic spine.  IMPRESSION:  1.  No acute pulmonary embolus. 2.  Pulmonary nodule in the left upper lobe is stable from 2009 and is most likely benign.   Original Report Authenticated By: Signa Kell, M.D.      1. Chest pain       Date: 12/06/2011  Rate: 86  Rhythm: normal sinus rhythm  QRS Axis: normal  Intervals: normal  ST/T Wave abnormalities: normal  Conduction Disutrbances:none  Narrative Interpretation:   Old EKG Reviewed: unchanged   MDM   Pt states pain is improved. Now 4/10. Discussed with Dr Wilber Bihari who will accept in transfer.       Loren Racer, MD 12/06/11 2146

## 2011-12-07 ENCOUNTER — Encounter (HOSPITAL_COMMUNITY): Payer: Self-pay

## 2011-12-07 DIAGNOSIS — R072 Precordial pain: Secondary | ICD-10-CM

## 2011-12-07 DIAGNOSIS — R0789 Other chest pain: Secondary | ICD-10-CM | POA: Diagnosis present

## 2011-12-07 DIAGNOSIS — R079 Chest pain, unspecified: Secondary | ICD-10-CM

## 2011-12-07 LAB — LIPID PANEL
HDL: 63 mg/dL (ref >39)
LDL Cholesterol: 62 mg/dL (ref 0–99)
Triglycerides: 55 mg/dL (ref <150)

## 2011-12-07 LAB — CBC
MCH: 30.1 pg (ref 26.0–34.0)
MCV: 87 fL (ref 78.0–100.0)
Platelets: 154 10*3/uL (ref 150–400)
RDW: 13.7 % (ref 11.5–15.5)

## 2011-12-07 LAB — BASIC METABOLIC PANEL
CO2: 25 mEq/L (ref 19–32)
Calcium: 9.5 mg/dL (ref 8.4–10.5)
Creatinine, Ser: 0.82 mg/dL (ref 0.50–1.10)
Glucose, Bld: 94 mg/dL (ref 70–99)

## 2011-12-07 LAB — TROPONIN I
Troponin I: <0.3 ng/mL (ref <0.30)
Troponin I: <0.3 ng/mL (ref <0.30)

## 2011-12-07 MED ORDER — ARIPIPRAZOLE 10 MG PO TABS: 10.0000 mg | ORAL_TABLET | Freq: Every day | ORAL | Status: DC

## 2011-12-07 MED ORDER — CLONAZEPAM 0.5 MG PO TABS: 0.5000 mg | ORAL_TABLET | Freq: Every day | ORAL | Status: DC | PRN

## 2011-12-07 MED ORDER — METOPROLOL TARTRATE 1 MG/ML IV SOLN
INTRAVENOUS | Status: AC
  Filled 2011-12-07: qty 5

## 2011-12-07 MED ORDER — ASPIRIN 81 MG PO CHEW: 81.0000 mg | CHEWABLE_TABLET | Freq: Every day | ORAL | Status: DC

## 2011-12-07 MED ORDER — SIMVASTATIN 20 MG PO TABS
20.0000 mg | ORAL_TABLET | Freq: Every day | ORAL | Status: DC
  Administered 2011-12-07: 20 mg via ORAL
  Filled 2011-12-07 (×2): qty 1

## 2011-12-07 MED ORDER — LITHIUM CARBONATE 300 MG PO CAPS
300.0000 mg | ORAL_CAPSULE | Freq: Two times a day (BID) | ORAL | Status: DC
  Administered 2011-12-07 (×2): 300 mg via ORAL
  Filled 2011-12-07 (×3): qty 1

## 2011-12-07 MED ORDER — TRAZODONE HCL 100 MG PO TABS
100.0000 mg | ORAL_TABLET | Freq: Every day | ORAL | Status: DC
  Administered 2011-12-07: 100 mg via ORAL
  Filled 2011-12-07 (×2): qty 1

## 2011-12-07 MED ORDER — SODIUM CHLORIDE 0.9 % IV SOLN
INTRAVENOUS | Status: AC
  Administered 2011-12-07: 01:00:00 via INTRAVENOUS

## 2011-12-07 MED ORDER — DIVALPROEX SODIUM 500 MG PO DR TAB
1000.0000 mg | DELAYED_RELEASE_TABLET | Freq: Once | ORAL | Status: AC
  Administered 2011-12-07: 1000 mg via ORAL
  Filled 2011-12-07 (×2): qty 2

## 2011-12-07 MED ORDER — ADULT MULTIVITAMIN W/MINERALS CH
1.0000 | ORAL_TABLET | Freq: Every day | ORAL | Status: DC
  Filled 2011-12-07: qty 1

## 2011-12-07 MED ORDER — NITROGLYCERIN 0.4 MG SL SUBL: 0.4000 mg | SUBLINGUAL_TABLET | SUBLINGUAL | Status: AC | PRN

## 2011-12-07 MED ORDER — ASPIRIN 81 MG PO CHEW: 81.0000 mg | CHEWABLE_TABLET | Freq: Once | ORAL | Status: DC

## 2011-12-07 MED ORDER — ADULT MULTIVITAMIN W/MINERALS CH
1.0000 | ORAL_TABLET | Freq: Once | ORAL | Status: AC
  Administered 2011-12-07: 1 via ORAL
  Filled 2011-12-07: qty 1

## 2011-12-07 MED ORDER — TRIAMTERENE-HCTZ 37.5-25 MG PO TABS
1.0000 | ORAL_TABLET | Freq: Once | ORAL | Status: AC
  Administered 2011-12-07: 1 via ORAL
  Filled 2011-12-07: qty 1

## 2011-12-07 MED ORDER — DIVALPROEX SODIUM 500 MG PO DR TAB
1000.0000 mg | DELAYED_RELEASE_TABLET | Freq: Every day | ORAL | Status: DC
  Filled 2011-12-07: qty 2

## 2011-12-07 MED ORDER — TRIAMTERENE-HCTZ 37.5-25 MG PO TABS
1.0000 | ORAL_TABLET | Freq: Every day | ORAL | Status: DC
  Filled 2011-12-07: qty 1

## 2011-12-07 MED ORDER — HYDROCODONE-ACETAMINOPHEN 5-325 MG PO TABS: 1.0000 | ORAL_TABLET | Freq: Four times a day (QID) | ORAL | Status: DC | PRN

## 2011-12-07 MED ORDER — NITROGLYCERIN 0.4 MG SL SUBL: 0.4000 mg | SUBLINGUAL_TABLET | SUBLINGUAL | Status: DC | PRN

## 2011-12-07 MED ORDER — CLOPIDOGREL BISULFATE 75 MG PO TABS
75.0000 mg | ORAL_TABLET | Freq: Once | ORAL | Status: AC
  Administered 2011-12-07: 75 mg via ORAL

## 2011-12-07 MED ORDER — ACETAMINOPHEN 325 MG PO TABS
650.0000 mg | ORAL_TABLET | Freq: Four times a day (QID) | ORAL | Status: DC | PRN
  Administered 2011-12-07 (×3): 650 mg via ORAL
  Filled 2011-12-07 (×3): qty 2

## 2011-12-07 MED ORDER — ONDANSETRON HCL 4 MG/2ML IJ SOLN: 4.0000 mg | Freq: Four times a day (QID) | INTRAMUSCULAR | Status: DC | PRN

## 2011-12-07 MED ORDER — LURASIDONE HCL 40 MG PO TABS: 40.0000 mg | ORAL_TABLET | Freq: Every day | ORAL | Status: DC

## 2011-12-07 MED ORDER — CLOPIDOGREL BISULFATE 75 MG PO TABS
75.0000 mg | ORAL_TABLET | Freq: Every day | ORAL | Status: DC
  Filled 2011-12-07 (×2): qty 1

## 2011-12-07 NOTE — Progress Notes (Signed)
*  PRELIMINARY RESULTS* Echocardiogram 2D Echocardiogram and Dobutamine stresshas been performed.  Kathleen [name] 12/07/2011, 4:21 PM

## 2011-12-07 NOTE — H&P (Signed)
Cardiology History and Physical  Neena Rhymes, MD  History of Present Illness (and review of medical records): Kathleen [name] is a 56 y.o. female who presents for evaluation of chest pain.  She has hx of MI with Cath in 2009 at that time revealing subtotally occluded second perforating branch of the left anterior  Descending, thought to be spontaneous dissection.  She had repeat Cath for recurrent pain that month with improved flow.  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  She rated pain as 7/10 and was associated with shortness of breath.  There was no radiation of pain.  Pain persisted for the rest of the day.  She called her daughter who is a Engineer, civil (consulting) here at [facility] around 630pm who instructed her to take 4 ASA and be seen right away.  She was evaluated at [facility] where she had normal ecg, negative troponin, abnormal d-dimer, but negative CTPA for PE.  She was treated with Nitro and morphine and was subsequently transferred for further management.  Previous diagnostic testing for coronary artery disease includes: cardiac catheterization. Previous history of cardiac disease includes Chest Pain Coronary Artery Disease MI Syncope. Coronary artery disease risk factors include: dyslipidemia, hypertension and obesity (BMI >= 30 kg/m2). Patient denies history of CHF, coronary angioplasty and coronary artery stent.  Review of Systems Further review of systems was otherwise negative other than stated in HPI.  Patient Active Problem List   Diagnosis Date Noted  . RUQ abdominal pain 07/24/2011  . Fungal infection of nail 02/15/2011  . General medical examination 02/15/2011  . CAD (coronary artery disease) 10/31/2010  . Sleep apnea 10/31/2010  . ARM INJURY 03/29/2010  . NEOPLASM OF UNCERTAIN BEHAVIOR OF SKIN 02/11/2010  . KNEE PAIN 01/05/2010  . MUSCLE SPASM, TRAPEZIUS  01/05/2010  . BIPOLAR DISORDER UNSPECIFIED 09/14/2009  . SINUSITIS- ACUTE-NOS 12/16/2008  . ACUT MYOCARD INFARCT UNS SITE SUBSQT EPIS CARE 07/02/2008  . EDEMA 06/23/2008  . DYSLIPIDEMIA 05/23/2008  . HYPERTENSION 05/23/2008  . UNSPECIFIED DISORDER OF THYROID 02/04/2008  . OBESITY 02/04/2008  . HYPERTENSION, BENIGN ESSENTIAL 02/04/2008  . MYOCARDIAL INFARCTION, HX OF 03/05/2007   Past Medical History  Diagnosis Date  . 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 Hypertension   . Depression   . Lower extremity deep venous thrombosis   . Dyslipidemia   . Psychiatric hospitalization 05/2008  . Asthma   . Coronary artery disease   . Anxiety     Past Surgical History  Procedure Date  . Breast mass excision     ae 19, benign tumor  . Knee surgery     right, removed cartilage  . Tubal ligation   . Tonsillectomy     Prescriptions prior to admission  Medication Sig Dispense Refill  . ARIPiprazole (ABILIFY) 10 MG tablet Take 10 mg by mouth daily.       [name] aspirin 81 MG tablet Take 81 mg by mouth daily.        . clonazePAM (KLONOPIN) 0.5 MG tablet Take 0.5 mg by mouth daily as needed. For anxiety      . clopidogrel (PLAVIX) 75 MG tablet Take 1 tablet (75 mg total) by mouth daily.  90 tablet  2  . divalproex (DEPAKOTE) 500 MG EC tablet Take 1,500 mg by mouth daily.        [name]  Hydrocodone-Acetaminophen 5-300 MG TABS 1 po q6h prn pain  30 each  0  . lurasidone (LATUDA) 40 MG TABS Take 40 mg by mouth daily with breakfast.      . Multiple Vitamin (MULTIVITAMIN) tablet Take 1 tablet by mouth daily.        . nitroGLYCERIN (NITROSTAT) 0.4 MG SL tablet Place 0.4 mg under the tongue every 5 (five) minutes as needed. For chest pain      . sertraline (ZOLOFT) 50 MG tablet       . simvastatin (ZOCOR) 20 MG tablet Take 1 tablet (20 mg total) by mouth at bedtime.  30 tablet  3  . traZODone (DESYREL) 50 MG tablet Take 50 mg by mouth at bedtime.       .  triamterene-hydrochlorothiazide (MAXZIDE-25) 37.5-25 MG per tablet Take 1 each (1 tablet total) by mouth daily.  90 tablet  2   Allergies  Allergen Reactions  . Lamictal (Lamotrigine)     Rash   . Eggs Or Egg-Derived Products Hives    History  Substance Use Topics  . Smoking status: Never Smoker   . Smokeless tobacco: Never Used  . Alcohol Use: 0.0 oz/week    0 Glasses of wine per week     Comment: occ.    Family History  Problem Relation Age of Onset  . Breast cancer Mother 73  . Alcohol abuse Mother   . Alcohol abuse Father   . Alcohol abuse Maternal Grandfather   . Drug abuse Maternal Grandfather   . Alcohol abuse Maternal Grandmother   . Alcohol abuse Paternal Grandfather   . Alcohol abuse Paternal Grandmother   . Schizophrenia Cousin      Objective: Patient Vitals for the past 8 hrs:  BP Temp Temp src Pulse Resp SpO2 Height Weight  12/07/11 0019 - - - - - - 5\' 11"  (1.803 m) 121.2 kg (267 lb 3.2 oz)  12/07/11 0015 118/58 mmHg - - 78  9  96 % - -  12/07/11 0000 143/69 mmHg 98.5 F (36.9 C) Oral - - 95 % - -  12/06/11 2256 173/80 mmHg - - 78  16  - - -  12/06/11 1907 163/81 mmHg 99.3 F (37.4 C) Oral 88  18  100 % 5\' 11"  (1.803 m) 125.193 kg (276 lb)   General Appearance:    Alert, cooperative, no distress, appears stated age, obese female  Head:    Normocephalic, without obvious abnormality, atraumatic  Eyes:     PERRL, EOMI, anicteric sclerae  Neck:   Supple, no carotid bruit or JVD  Lungs:     Clear to auscultation bilaterally, respirations unlabored  Heart:    Regular rate and rhythm, S1 and S2 normal, no murmur  Abdomen:     Soft, non-tender, normoactive bowel sounds  Extremities:   Extremities normal, atraumatic, no cyanosis or edema  Pulses:   2+ and symmetric all extremities  Skin:   Petechial like rash on RLE  Neurologic:   No focal deficits. AAO x3   Results for orders placed during the hospital encounter of 12/06/11 (from the past 48 hour(s))    TROPONIN I     Status: Normal   Collection Time   12/06/11  7:30 PM      Component Value Range Comment   Troponin I <0.30  <0.30 ng/mL   CBC WITH DIFFERENTIAL     Status: Normal   Collection Time   12/06/11  7:30 PM  Component Value Range Comment   WBC 7.0  4.0 - 10.5 K/uL    RBC 4.48  3.87 - 5.11 MIL/uL    Hemoglobin 13.1  12.0 - 15.0 g/dL    HCT 14.7  82.9 - 56.2 %    MCV 85.3  78.0 - 100.0 fL    MCH 29.2  26.0 - 34.0 pg    MCHC 34.3  30.0 - 36.0 g/dL    RDW 13.0  86.5 - 78.4 %    Platelets 152  150 - 400 K/uL    Neutrophils Relative 70  43 - 77 %    Neutro Abs 4.9  1.7 - 7.7 K/uL    Lymphocytes Relative 21  12 - 46 %    Lymphs Abs 1.5  0.7 - 4.0 K/uL    Monocytes Relative 7  3 - 12 %    Monocytes Absolute 0.5  0.1 - 1.0 K/uL    Eosinophils Relative 2  0 - 5 %    Eosinophils Absolute 0.1  0.0 - 0.7 K/uL    Basophils Relative 0  0 - 1 %    Basophils Absolute 0.0  0.0 - 0.1 K/uL   COMPREHENSIVE METABOLIC PANEL     Status: Abnormal   Collection Time   12/06/11  7:30 PM      Component Value Range Comment   Sodium 140  135 - 145 mEq/L    Potassium 3.4 (*) 3.5 - 5.1 mEq/L    Chloride 101  96 - 112 mEq/L    CO2 27  19 - 32 mEq/L    Glucose, Bld 99  70 - 99 mg/dL    BUN 19  6 - 23 mg/dL    Creatinine, Ser 6.96  0.50 - 1.10 mg/dL    Calcium 9.9  8.4 - 29.5 mg/dL    Total Protein 7.0  6.0 - 8.3 g/dL    Albumin 3.9  3.5 - 5.2 g/dL    AST 18  0 - 37 U/L    ALT 16  0 - 35 U/L    Alkaline Phosphatase 89  39 - 117 U/L    Total Bilirubin 0.3  0.3 - 1.2 mg/dL    GFR calc non Af Amer 70 (*) >90 mL/min    GFR calc Af Amer 81 (*) >90 mL/min   D-DIMER, QUANTITATIVE     Status: Abnormal   Collection Time   12/06/11  8:09 PM      Component Value Range Comment   D-Dimer, Quant 0.69 (*) 0.00 - 0.48 ug/mL-FEU    Dg Chest 2 View  12/06/2011  *RADIOLOGY REPORT*  Clinical Data: Chest pain and shortness of breath.  CHEST - 2 VIEW  Comparison: CT chest 05/21/2008.  Findings: The  heart size is normal.  Linear scarring at the left lung base is stable.  Mild interstitial coarsening is stable.  No focal airspace disease is evident.  Mild degenerative changes of the thoracic spine are stable.  IMPRESSION:  1.  No acute cardiopulmonary disease or significant interval change. 2.  Mild interstitial coarsening is chronic.   Original Report Authenticated By: Marin Roberts, M.D.    Ct Angio Chest W/cm &/or Wo Cm  12/06/2011  *RADIOLOGY REPORT*  Clinical Data: Chest pain  CT ANGIOGRAPHY CHEST  Technique:  Multidetector CT imaging of the chest using the standard protocol during bolus administration of intravenous contrast. Multiplanar reconstructed images including MIPs were obtained and reviewed to evaluate the vascular anatomy.  Contrast:  OMNIPAQUE IOHEXOL 350 MG/ML SOLN  Comparison: 05/21/2008  Findings: Lungs/pleura: There is no pleural effusion.  No airspace consolidation identified.  There is a pulmonary nodule in the left upper lobe which measures 3.8 mm, image 39.  Likely present on the previous exam.  Heart/Mediastinum: Normal heart size. No pericardial effusion No mediastinal or hilar adenopathy.  Calcified right hilar lymph nodes are identified.  The pulmonary arteries are patent.  There is no evidence for acute embolus.  Upper abdomen: No acute findings identified.  There is a cyst arising from the upper pole of the left kidney.  Only partially visualized measuring 8 mm.  Bones/Musculoskeletal:  Multilevel spondylosis noted within the thoracic spine.  IMPRESSION:  1.  No acute pulmonary embolus. 2.  Pulmonary nodule in the left upper lobe is stable from 2009 and is most likely benign.   Original Report Authenticated By: Signa Kell, M.D.     ECG:  Sinus rhythm no acute ischemic changes  Cath 02/2007: Left main coronary artery was normal.  Left anterior descending artery was normal. The patient had a large  intermediate branch, which was normal.  First septal  perforator branch was normal. The second septal perforator  branch actually seemed improved. It was somewhat prune like in its  distal portion, but clearly filled better than seen on the previous  catheterization.  Circumflex coronary artery was nondominant. After the takeoff of the  large intermediate branch, there was only a smaller first obtuse  marginal branch and a small AV groove branch.  The right coronary artery was extremely large and dominant. It was  totally normal with a normal RV branch, normal PDA and PLA.  RAO ventriculography was normal, EF of 60%. There was no gradient  across the aortic valve and no MR. Aortic pressure was 130/70. LV  pressure was 130/90.   Assessment: 20F hx of MI with Cath revealed subtotally occluded second perforating branch of the left anterior  descending, suspicious for spontaneous dissection with repeat as above, HTN, dyslipidemia, OSA presents with acute onset of chest pain.  Will admit and r/o for acute MI.  Plan:  1. Admit to Cardiology, LB 2. Continuous monitoring on Telemetry. 3. Repeat ekg on admit, prn chest pain or arrythmia 4. Trend cardiac biomarkers, check lipids, hgba1c, tsh 5. Medical management to include ASA, Plavix, Statin, NTG prn 6. NPO @ MN 7. TTE in am assess for LV function and wall motion abnormalities. 8. Consider further ischemic evaluation with nuclear stress testing vs cardiac catheterization pending clinical course and results of initial studies.

## 2011-12-07 NOTE — Op Note (Signed)
Dobutamine stress test:  Dobutamine started at per protocol at 1547. Dobutamine increased to 30 mcg per protocol at 1550 and pt reached target heart rate at 1552, heart rate 141. Dobutamine drip was stopped per P.A. and pt was monitored per protocol. V/S in epic flow sheet, pt comfortable and V/S back to baseline at 1600.

## 2011-12-07 NOTE — Care Management Note (Signed)
    Page 1 of 1   12/07/2011     8:44:43 AM   CARE MANAGEMENT NOTE 12/07/2011  Patient:  Kathleen [name], Kathleen [name]   Account Number:  [id number]  Date Initiated:  12/07/2011  Documentation initiated by:  Junius Creamer  Subjective/Objective Assessment:   adm w ch pain     Action/Plan:   lives alone, pcp dr Neena Rhymes   Anticipated DC Date:     Anticipated DC Plan:  HOME/SELF CARE      DC Planning Services  CM consult      Choice offered to / List presented to:             Status of service:   Medicare Important Message given?   (If response is "NO", the following Medicare IM given date fields will be blank) Date Medicare IM given:   Date Additional Medicare IM given:    Discharge Disposition:  HOME/SELF CARE  Per UR Regulation:  Reviewed for med. necessity/level of care/duration of stay  If discussed at Long Length of Stay Meetings, dates discussed:    Comments:  11/14 8:43a debbie Jumanah Hynson rn,bsn [phone]

## 2011-12-07 NOTE — Discharge Summary (Signed)
CARDIOLOGY DISCHARGE SUMMARY   Patient ID: Kathleen [name] MRN: [id number] DOB/AGE: January 03, 1956 56 y.o.  Admit date: 12/06/2011 Discharge date: 12/07/2011  Primary Discharge Diagnosis:  Chest pain, medical therapy for CAD  Secondary Discharge Diagnosis:  Past Medical History  Diagnosis Date  . 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 Hypertension   . Depression   . Lower extremity deep venous thrombosis   . Dyslipidemia   . Psychiatric hospitalization 05/2008  . Asthma   . Coronary artery disease   . Anxiety    Procedures: Dobutamine stress echocardiogram  Hospital Course: Kathleen [name] is a 56 year old female with a history of coronary artery disease. She had chest pain and came to the hospital where she was admitted for further evaluation and treatment.  Her cardiac enzymes were negative for MI. Other labs listed below reviewed and showed no significant abnormalities. Her chest pain resolved. Since her cardiac enzymes were negative, a stress echocardiogram with dobutamine was ordered to evaluate for ischemia.  Her target heart rate was reached and exceeded with the dobutamine. Once her heart rate reached target, echocardiogram images were obtained and reviewed by Dr. Antoine Poche. Her EF was preserved and there was no stress-induced evidence of ischemia. Post-procedure, Kathleen [name] was ambulating without chest pain or shortness of breath and considered stable for discharge, to followup as an outpatient as scheduled.  Labs:   Lab Results  Component Value Date   WBC 6.4 12/07/2011   HGB 13.2 12/07/2011   HCT 38.2 12/07/2011   MCV 87.0 12/07/2011   PLT 154 12/07/2011     Lab 12/07/11 0210 12/06/11 1930  NA 137 --  K 3.6 --  CL 102 --  CO2 25 --  BUN 17 --  CREATININE 0.82 --  CALCIUM 9.5 --  PROT -- 7.0  BILITOT -- 0.3  ALKPHOS -- 89  ALT -- 16  AST -- 18  GLUCOSE 94 --    Basename 12/07/11 1255 12/07/11 0640 12/07/11 0123  CKTOTAL -- -- --    CKMB -- -- --  CKMBINDEX -- -- --  TROPONINI <0.30 <0.30 <0.30   Lipid Panel     Component Value Date/Time   CHOL 136 12/07/2011 0640   TRIG 55 12/07/2011 0640   HDL 63 12/07/2011 0640   CHOLHDL 2.2 12/07/2011 0640   VLDL 11 12/07/2011 0640   LDLCALC 62 12/07/2011 0640    Pro B Natriuretic peptide (BNP)  Date/Time Value Range Status  05/21/2008 11:06 AM 86.0  0.0 - 100.0 pg/mL Final    Basename 12/07/11 0210  INR 1.01      Radiology: Dg Chest 2 View  12/06/2011  *RADIOLOGY REPORT*  Clinical Data: Chest pain and shortness of breath.  CHEST - 2 VIEW  Comparison: CT chest 05/21/2008.  Findings: The heart size is normal.  Linear scarring at the left lung base is stable.  Mild interstitial coarsening is stable.  No focal airspace disease is evident.  Mild degenerative changes of the thoracic spine are stable.  IMPRESSION:  1.  No acute cardiopulmonary disease or significant interval change. 2.  Mild interstitial coarsening is chronic.   Original Report Authenticated By: Marin Roberts, M.D.    Ct Angio Chest W/cm &/or Wo Cm  12/06/2011  *RADIOLOGY REPORT*  Clinical Data: Chest pain  CT ANGIOGRAPHY CHEST  Technique:  Multidetector CT imaging of the chest using the standard protocol during bolus administration of intravenous contrast. Multiplanar reconstructed images including MIPs were  obtained and reviewed to evaluate the vascular anatomy.  Contrast: OMNIPAQUE IOHEXOL 350 MG/ML SOLN  Comparison: 05/21/2008  Findings: Lungs/pleura: There is no pleural effusion.  No airspace consolidation identified.  There is a pulmonary nodule in the left upper lobe which measures 3.8 mm, image 39.  Likely present on the previous exam.  Heart/Mediastinum: Normal heart size. No pericardial effusion No mediastinal or hilar adenopathy.  Calcified right hilar lymph nodes are identified.  The pulmonary arteries are patent.  There is no evidence for acute embolus.  Upper abdomen: No acute findings  identified.  There is a cyst arising from the upper pole of the left kidney.  Only partially visualized measuring 8 mm.  Bones/Musculoskeletal:  Multilevel spondylosis noted within the thoracic spine.  IMPRESSION:  1.  No acute pulmonary embolus. 2.  Pulmonary nodule in the left upper lobe is stable from 2009 and is most likely benign.   Original Report Authenticated By: Signa Kell, M.D.    EKG:  07-Dec-2011 06:55:09 [facility]-CCU ROUTINE RECORD Normal sinus rhythm Normal ECG 60mm/s 72mm/mV 100Hz  8.0.1 12SL 241 HD CID: 1 Referred by: Unconfirmed Vent. rate 75 BPM PR interval 170 ms QRS duration 82 ms QT/QTc 398/444 ms P-R-T axes 64 56 62  Echo:  Stress echo results: This is intrepreted as a negative Dobutamine echo. There is no evidence of ischemia. The LV function is normal.  FOLLOW UP PLANS AND APPOINTMENTS Allergies  Allergen Reactions  . Lamictal (Lamotrigine)     Rash   . Eggs Or Egg-Derived Products Hives     Medication List     As of 12/07/2011  6:59 PM    TAKE these medications         aspirin 81 MG tablet   Take 81 mg by mouth daily.      clonazePAM 0.5 MG tablet   Commonly known as: KLONOPIN   Take 0.5 mg by mouth daily as needed. For anxiety      clopidogrel 75 MG tablet   Commonly known as: PLAVIX   Take 1 tablet (75 mg total) by mouth daily.      divalproex 500 MG 24 hr tablet   Commonly known as: DEPAKOTE ER   Take 1,000 mg by mouth at bedtime.      lithium carbonate 300 MG capsule   Take 300 mg by mouth 2 (two) times daily with a meal.      multivitamin tablet   Take 1 tablet by mouth daily.      nitroGLYCERIN 0.4 MG SL tablet   Commonly known as: NITROSTAT   Place 0.4 mg under the tongue every 5 (five) minutes as needed. For chest pain      simvastatin 20 MG tablet   Commonly known as: ZOCOR   Take 1 tablet (20 mg total) by mouth at bedtime.      traZODone 50 MG tablet   Commonly known as: DESYREL   Take 100 mg by  mouth at bedtime. Take 1/2 to 1 tab at bedtime      triamterene-hydrochlorothiazide 37.5-25 MG per tablet   Commonly known as: MAXZIDE-25   Take 1 each (1 tablet total) by mouth daily.         Discharge Orders    Future Appointments: Provider: Department: Dept Phone: Center:   12/18/2011 9:00 AM Rollene Rotunda, MD [facility] Main Office [place]) [phone] [id number]   02/16/2012 8:00 AM Sheliah Hatch, MD [facility]  [place] [phone] [facility]  Follow-up Information    Follow up with Rollene Rotunda, MD. On 12/18/2011. (at 9:00 am)    Contact information:   [address] [place] [address] [phone]          BRING ALL MEDICATIONS WITH YOU TO FOLLOW UP APPOINTMENTS  Time spent with patient to include physician time: 33 min Signed: Theodore Demark 12/07/2011, 6:59 PM Co-Sign MD

## 2011-12-07 NOTE — Progress Notes (Signed)
SUBJECTIVE:  No further chest pain   PHYSICAL EXAM Filed Vitals:   12/07/11 0145 12/07/11 0200 12/07/11 0330 12/07/11 0732  BP: 116/55 114/57 104/87 115/63  Pulse: 79 78 75 73  Temp:   98.4 F (36.9 C) 98 F (36.7 C)  TempSrc:   Oral Oral  Resp: 17 14 13    Height:      Weight:      SpO2: 95% 92% 91% 97%   General:  No distress Lungs:  Clear Heart:  RRR, no rub, no murmur Abdomen:  Positive bowel sounds, no rebound no guarding Extremities:  No edema  LABS: Lab Results  Component Value Date   CKTOTAL 49 05/21/2008   CKMB 0.9 05/21/2008   TROPONINI <0.30 12/07/2011   Results for orders placed during the hospital encounter of 12/06/11 (from the past 24 hour(s))  TROPONIN I     Status: Normal   Collection Time   12/06/11  7:30 PM      Component Value Range   Troponin I <0.30  <0.30 ng/mL  CBC WITH DIFFERENTIAL     Status: Normal   Collection Time   12/06/11  7:30 PM      Component Value Range   WBC 7.0  4.0 - 10.5 K/uL   RBC 4.48  3.87 - 5.11 MIL/uL   Hemoglobin 13.1  12.0 - 15.0 g/dL   HCT 62.1  30.8 - 65.7 %   MCV 85.3  78.0 - 100.0 fL   MCH 29.2  26.0 - 34.0 pg   MCHC 34.3  30.0 - 36.0 g/dL   RDW 84.6  96.2 - 95.2 %   Platelets 152  150 - 400 K/uL   Neutrophils Relative 70  43 - 77 %   Neutro Abs 4.9  1.7 - 7.7 K/uL   Lymphocytes Relative 21  12 - 46 %   Lymphs Abs 1.5  0.7 - 4.0 K/uL   Monocytes Relative 7  3 - 12 %   Monocytes Absolute 0.5  0.1 - 1.0 K/uL   Eosinophils Relative 2  0 - 5 %   Eosinophils Absolute 0.1  0.0 - 0.7 K/uL   Basophils Relative 0  0 - 1 %   Basophils Absolute 0.0  0.0 - 0.1 K/uL  COMPREHENSIVE METABOLIC PANEL     Status: Abnormal   Collection Time   12/06/11  7:30 PM      Component Value Range   Sodium 140  135 - 145 mEq/L   Potassium 3.4 (*) 3.5 - 5.1 mEq/L   Chloride 101  96 - 112 mEq/L   CO2 27  19 - 32 mEq/L   Glucose, Bld 99  70 - 99 mg/dL   BUN 19  6 - 23 mg/dL   Creatinine, Ser 8.41  0.50 - 1.10 mg/dL   Calcium 9.9   8.4 - 32.4 mg/dL   Total Protein 7.0  6.0 - 8.3 g/dL   Albumin 3.9  3.5 - 5.2 g/dL   AST 18  0 - 37 U/L   ALT 16  0 - 35 U/L   Alkaline Phosphatase 89  39 - 117 U/L   Total Bilirubin 0.3  0.3 - 1.2 mg/dL   GFR calc non Af Amer 70 (*) >90 mL/min   GFR calc Af Amer 81 (*) >90 mL/min  D-DIMER, QUANTITATIVE     Status: Abnormal   Collection Time   12/06/11  8:09 PM      Component Value Range  D-Dimer, Quant 0.69 (*) 0.00 - 0.48 ug/mL-FEU  MRSA PCR SCREENING     Status: Normal   Collection Time   12/07/11 12:10 AM      Component Value Range   MRSA by PCR NEGATIVE  NEGATIVE  TROPONIN I     Status: Normal   Collection Time   12/07/11  1:23 AM      Component Value Range   Troponin I <0.30  <0.30 ng/mL  BASIC METABOLIC PANEL     Status: Abnormal   Collection Time   12/07/11  2:10 AM      Component Value Range   Sodium 137  135 - 145 mEq/L   Potassium 3.6  3.5 - 5.1 mEq/L   Chloride 102  96 - 112 mEq/L   CO2 25  19 - 32 mEq/L   Glucose, Bld 94  70 - 99 mg/dL   BUN 17  6 - 23 mg/dL   Creatinine, Ser 9.60  0.50 - 1.10 mg/dL   Calcium 9.5  8.4 - 45.4 mg/dL   GFR calc non Af Amer 79 (*) >90 mL/min   GFR calc Af Amer >90  >90 mL/min  CBC     Status: Normal   Collection Time   12/07/11  2:10 AM      Component Value Range   WBC 6.4  4.0 - 10.5 K/uL   RBC 4.39  3.87 - 5.11 MIL/uL   Hemoglobin 13.2  12.0 - 15.0 g/dL   HCT 09.8  11.9 - 14.7 %   MCV 87.0  78.0 - 100.0 fL   MCH 30.1  26.0 - 34.0 pg   MCHC 34.6  30.0 - 36.0 g/dL   RDW 82.9  56.2 - 13.0 %   Platelets 154  150 - 400 K/uL  PROTIME-INR     Status: Normal   Collection Time   12/07/11  2:10 AM      Component Value Range   Prothrombin Time 13.2  11.6 - 15.2 seconds   INR 1.01  0.00 - 1.49  MAGNESIUM     Status: Normal   Collection Time   12/07/11  2:10 AM      Component Value Range   Magnesium 2.4  1.5 - 2.5 mg/dL  TROPONIN I     Status: Normal   Collection Time   12/07/11  6:40 AM      Component Value Range    Troponin I <0.30  <0.30 ng/mL  LIPID PANEL     Status: Normal   Collection Time   12/07/11  6:40 AM      Component Value Range   Cholesterol 136  0 - 200 mg/dL   Triglycerides 55  <865 mg/dL   HDL 63  >78 mg/dL   Total CHOL/HDL Ratio 2.2     VLDL 11  0 - 40 mg/dL   LDL Cholesterol 62  0 - 99 mg/dL    Intake/Output Summary (Last 24 hours) at 12/07/11 0816 Last data filed at 12/07/11 0209  Gross per 24 hour  Intake    240 ml  Output      0 ml  Net    240 ml   ASSESSMENT AND PLAN:  Chest pain:  Admitted earlier today with chest pain.  Ruled out.  I would like to try to order a dobutamine echocardiogram.       Kathleen [name] 12/07/2011 8:16 AM

## 2011-12-18 ENCOUNTER — Encounter: Payer: Self-pay | Admitting: Cardiology

## 2011-12-18 ENCOUNTER — Ambulatory Visit (INDEPENDENT_AMBULATORY_CARE_PROVIDER_SITE_OTHER): Payer: BC Managed Care – PPO | Admitting: Cardiology

## 2011-12-18 VITALS — BP 125/79 | HR 81 | Ht 71.0 in | Wt 281.0 lb

## 2011-12-18 DIAGNOSIS — I2510 Atherosclerotic heart disease of native coronary artery without angina pectoris: Secondary | ICD-10-CM

## 2011-12-18 NOTE — Progress Notes (Signed)
HPI The patient presents for follow up of her CAD and chest pain.  She was hospitalized recently for chest pain and ruled out. She had a negative dobutamine echocardiogram at that time. The etiology of her discomfort was unclear.  Since that hospitalization she has had continued episodes that she thinks are related to panic. She's had no symptoms though consistent with her previous unstable angina.  Allergies  Allergen Reactions  . Lamictal (Lamotrigine)     Rash   . Eggs Or Egg-Derived Products Hives    Current Outpatient Prescriptions  Medication Sig Dispense Refill  . aspirin 81 MG tablet Take 81 mg by mouth daily.        . clonazePAM (KLONOPIN) 0.5 MG tablet Take 0.5 mg by mouth daily as needed. For anxiety      . clopidogrel (PLAVIX) 75 MG tablet Take 1 tablet (75 mg total) by mouth daily.  90 tablet  2  . divalproex (DEPAKOTE ER) 500 MG 24 hr tablet Take 1,000 mg by mouth at bedtime.      [name] lithium carbonate 300 MG capsule Take 300 mg by mouth 2 (two) times daily with a meal.      . Multiple Vitamin (MULTIVITAMIN) tablet Take 1 tablet by mouth daily.        . nitroGLYCERIN (NITROSTAT) 0.4 MG SL tablet Place 1 tablet (0.4 mg total) under the tongue every 5 (five) minutes as needed for chest pain. For chest pain  25 tablet  3  . simvastatin (ZOCOR) 20 MG tablet Take 1 tablet (20 mg total) by mouth at bedtime.  30 tablet  3  . traZODone (DESYREL) 50 MG tablet Take 100 mg by mouth at bedtime. Take 1/2 to 1 tab at bedtime      . triamterene-hydrochlorothiazide (MAXZIDE-25) 37.5-25 MG per tablet Take 1 each (1 tablet total) by mouth daily.  90 tablet  2    Past Medical History  Diagnosis Date  . 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 Hypertension   . Depression   . Lower extremity deep venous thrombosis   . Dyslipidemia   . Psychiatric hospitalization 05/2008  . Asthma   . Coronary artery disease   . Anxiety     Past Surgical History  Procedure  Date  . Breast mass excision     ae 19, benign tumor  . Knee surgery     right, removed cartilage  . Tubal ligation   . Tonsillectomy     ROS:  As stated in the HPI and negative for all other systems.  PHYSICAL EXAM BP 125/79  Pulse 81  Ht 5\' 11"  (1.803 m)  Wt 281 lb (127.461 kg)  BMI 39.19 kg/m2  SpO2 99% GENERAL:  Well appearing HEENT:  Pupils equal round and reactive, fundi not visualized, oral mucosa unremarkable NECK:  No jugular venous distention, waveform within normal limits, carotid upstroke brisk and symmetric, no bruits, no thyromegaly LUNGS:  Clear to auscultation bilaterally CHEST:  Unremarkable HEART:  PMI not displaced or sustained,S1 and S2 within normal limits, no S3, no S4, no clicks, no rubs, no murmurs ABD:  Flat, positive bowel sounds normal in frequency in pitch, no bruits, no rebound, no guarding, no midline pulsatile mass, no hepatomegaly, no splenomegaly EXT:  2 plus pulses throughout, no edema, no cyanosis no clubbing   EKG:  Sinus rhythm, rate 82, axis within normal limits, intervals within normal limits, she no acute ST-T wave changes.  12/18/2011  ASSESSMENT AND PLAN  CAD (coronary artery disease) -  The patient has no new sypmtoms. No further cardiovascular testing is indicated. We will continue with aggressive risk reduction and meds as listed.   I agree with her that this could be panic and she is do to have follow up with her psychiatrist next week.   DYSLIPIDEMIA -  Her lipid profile was excellent. She will continue on the meds as listed.  HYPERTENSION -  The blood pressure is at target. No change in medications is indicated. We will continue with therapeutic lifestyle changes (TLC).

## 2011-12-18 NOTE — Patient Instructions (Addendum)
The current medical regimen is effective;  continue present plan and medications.  Follow up in 1 year with Dr Hochrein.  You will receive a letter in the mail 2 months before you are due.  Please call us when you receive this letter to schedule your follow up appointment.  

## 2011-12-23 ENCOUNTER — Other Ambulatory Visit: Payer: Self-pay | Admitting: Cardiology

## 2012-01-18 ENCOUNTER — Other Ambulatory Visit: Payer: Self-pay | Admitting: Cardiology

## 2012-01-31 ENCOUNTER — Telehealth: Payer: Self-pay | Admitting: *Deleted

## 2012-01-31 NOTE — Telephone Encounter (Signed)
Received letter from Nurse Case Manager at [facility] requesting info on pt. Called pts cell number to get permission to release info, lmovm for pt to return my call.

## 2012-02-16 ENCOUNTER — Ambulatory Visit (INDEPENDENT_AMBULATORY_CARE_PROVIDER_SITE_OTHER): Payer: BC Managed Care – PPO | Admitting: Family Medicine

## 2012-02-16 ENCOUNTER — Encounter: Payer: Self-pay | Admitting: Family Medicine

## 2012-02-16 VITALS — BP 118/70 | HR 83 | Temp 98.2°F | Ht 72.0 in | Wt 285.0 lb

## 2012-02-16 DIAGNOSIS — Z0000 Encounter for general adult medical examination without abnormal findings: Secondary | ICD-10-CM

## 2012-02-16 LAB — LIPID PANEL
Cholesterol: 160 mg/dL (ref 0–200)
VLDL: 14.4 mg/dL (ref 0.0–40.0)

## 2012-02-16 LAB — HEPATIC FUNCTION PANEL
ALT: 15 U/L (ref 0–35)
AST: 16 U/L (ref 0–37)
Albumin: 4.1 g/dL (ref 3.5–5.2)
Bilirubin, Direct: 0 mg/dL (ref 0.0–0.3)
Total Bilirubin: 0.6 mg/dL (ref 0.3–1.2)
Total Protein: 7.3 g/dL (ref 6.0–8.3)

## 2012-02-16 LAB — CBC WITH DIFFERENTIAL/PLATELET
Basophils Absolute: 0 10*3/uL (ref 0.0–0.1)
Basophils Relative: 0.4 % (ref 0.0–3.0)
Eosinophils Absolute: 0.1 10*3/uL (ref 0.0–0.7)
HCT: 43.4 % (ref 36.0–46.0)
Hemoglobin: 14.7 g/dL (ref 12.0–15.0)
Lymphocytes Relative: 24.1 % (ref 12.0–46.0)
Lymphs Abs: 1.1 10*3/uL (ref 0.7–4.0)
MCHC: 33.8 g/dL (ref 30.0–36.0)
MCV: 86.6 fl (ref 78.0–100.0)
Neutro Abs: 3 10*3/uL (ref 1.4–7.7)
Neutrophils Relative %: 66.3 % (ref 43.0–77.0)
RBC: 5.01 Mil/uL (ref 3.87–5.11)

## 2012-02-16 LAB — BASIC METABOLIC PANEL
BUN: 24 mg/dL — ABNORMAL HIGH (ref 6–23)
Chloride: 106 mEq/L (ref 96–112)
Creatinine, Ser: 0.9 mg/dL (ref 0.4–1.2)
Glucose, Bld: 105 mg/dL — ABNORMAL HIGH (ref 70–99)

## 2012-02-16 LAB — TSH: TSH: 2.03 u[IU]/mL (ref 0.35–5.50)

## 2012-02-16 NOTE — Assessment & Plan Note (Signed)
Pt's PE WNL.  UTD on eye exam, upcoming GYN exam, UTD on colonoscopy.  Check labs.  Anticipatory guidance provided.

## 2012-02-16 NOTE — Progress Notes (Signed)
  Subjective:    Patient ID: Kathleen [name], female    DOB: 1955/05/23, 57 y.o.   MRN: [id number]  HPI CPE- UTD on colonoscopy, GYN- [place], UTD on eye exam.   Review of Systems Patient reports no hearing changes, adenopathy,fever, weight change,  persistant/recurrent hoarseness , swallowing issues, chest pain, palpitations, edema, persistant/recurrent cough, hemoptysis, dyspnea (rest/exertional/paroxysmal nocturnal), gastrointestinal bleeding (melena, rectal bleeding), abdominal pain, significant heartburn, bowel changes, GU symptoms (dysuria, hematuria, incontinence), Gyn symptoms (abnormal  bleeding, pain),  syncope, focal weakness, memory loss, numbness & tingling, skin/hair/nail changes, abnormal bruising or bleeding.  + blurry vision x6 months, has seen ophtho multiple times    Objective:   Physical Exam General Appearance:    Alert, cooperative, no distress, appears stated age  Head:    Normocephalic, without obvious abnormality, atraumatic  Eyes:    PERRL, conjunctiva/corneas clear, EOM's intact, fundi    benign, both eyes  Ears:    Normal TM's and external ear canals, both ears  Nose:   Nares normal, septum midline, mucosa normal, no drainage    or sinus tenderness  Throat:   Lips, mucosa, and tongue normal; teeth and gums normal  Neck:   Supple, symmetrical, trachea midline, no adenopathy;    Thyroid: no enlargement/tenderness/nodules  Back:     Symmetric, no curvature, ROM normal, no CVA tenderness  Lungs:     Clear to auscultation bilaterally, respirations unlabored  Chest Wall:    No tenderness or deformity   Heart:    Regular rate and rhythm, S1 and S2 normal, no murmur, rub   or gallop  Breast Exam:    Deferred to GYN  Abdomen:     Soft, non-tender, bowel sounds active all four quadrants,    no masses, no organomegaly  Genitalia:    Deferred to GYN  Rectal:    Extremities:   Extremities normal, atraumatic, no cyanosis or edema  Pulses:   2+ and symmetric all  extremities  Skin:   Skin color, texture, turgor normal, no rashes or lesions  Lymph nodes:   Cervical, supraclavicular, and axillary nodes normal  Neurologic:   CNII-XII intact, normal strength, sensation and reflexes    throughout          Assessment & Plan:

## 2012-02-16 NOTE — Patient Instructions (Addendum)
Follow up in 6 months to recheck BP We'll notify you of your lab results and make any changes if needed Try and get regular exercise- it's a good stress outlet- and make healthy food choices Call with any questions or concerns Hang in there!

## 2012-02-20 LAB — VITAMIN D 1,25 DIHYDROXY
Vitamin D2 1, 25 (OH)2: <8 pg/mL
Vitamin D3 1, 25 (OH)2: 35 pg/mL

## 2012-03-09 ENCOUNTER — Other Ambulatory Visit: Payer: Self-pay

## 2012-05-06 ENCOUNTER — Other Ambulatory Visit: Payer: Self-pay | Admitting: Cardiology

## 2012-06-18 ENCOUNTER — Telehealth: Payer: Self-pay | Admitting: Cardiology

## 2012-06-18 NOTE — Telephone Encounter (Signed)
New problem   Pt bp is 156/90, 165/100, 159/96, swollen ankles and sob. Please call pt

## 2012-06-18 NOTE — Telephone Encounter (Signed)
Per pt - states BP has been elevated and these are the readings for the past three days.  She has also had some swollen ankles and SOB.  She takes Triam/HCTZ 37.5/25 mg daily.  Aware I will review with MD and call her back with orders.

## 2012-06-19 ENCOUNTER — Emergency Department (HOSPITAL_COMMUNITY): Payer: BC Managed Care – PPO

## 2012-06-19 ENCOUNTER — Emergency Department (HOSPITAL_COMMUNITY)
Admission: EM | Admit: 2012-06-19 | Discharge: 2012-06-19 | Disposition: A | Discharge status: Home / Self Care | Payer: BC Managed Care – PPO | Attending: Emergency Medicine | Admitting: Emergency Medicine

## 2012-06-19 ENCOUNTER — Encounter (HOSPITAL_COMMUNITY): Payer: Self-pay | Admitting: Emergency Medicine

## 2012-06-19 DIAGNOSIS — E789 Disorder of lipoprotein metabolism, unspecified: Secondary | ICD-10-CM | POA: Insufficient documentation

## 2012-06-19 DIAGNOSIS — I2510 Atherosclerotic heart disease of native coronary artery without angina pectoris: Secondary | ICD-10-CM | POA: Insufficient documentation

## 2012-06-19 DIAGNOSIS — R0989 Other specified symptoms and signs involving the circulatory and respiratory systems: Secondary | ICD-10-CM | POA: Insufficient documentation

## 2012-06-19 DIAGNOSIS — R51 Headache: Secondary | ICD-10-CM | POA: Insufficient documentation

## 2012-06-19 DIAGNOSIS — R42 Dizziness and giddiness: Secondary | ICD-10-CM | POA: Insufficient documentation

## 2012-06-19 DIAGNOSIS — Z86718 Personal history of other venous thrombosis and embolism: Secondary | ICD-10-CM | POA: Insufficient documentation

## 2012-06-19 DIAGNOSIS — Z8659 Personal history of other mental and behavioral disorders: Secondary | ICD-10-CM | POA: Insufficient documentation

## 2012-06-19 DIAGNOSIS — Z7982 Long term (current) use of aspirin: Secondary | ICD-10-CM | POA: Insufficient documentation

## 2012-06-19 DIAGNOSIS — R0600 Dyspnea, unspecified: Secondary | ICD-10-CM

## 2012-06-19 DIAGNOSIS — I10 Essential (primary) hypertension: Secondary | ICD-10-CM

## 2012-06-19 DIAGNOSIS — R609 Edema, unspecified: Secondary | ICD-10-CM | POA: Insufficient documentation

## 2012-06-19 DIAGNOSIS — R0609 Other forms of dyspnea: Secondary | ICD-10-CM | POA: Insufficient documentation

## 2012-06-19 DIAGNOSIS — H538 Other visual disturbances: Secondary | ICD-10-CM | POA: Insufficient documentation

## 2012-06-19 DIAGNOSIS — I252 Old myocardial infarction: Secondary | ICD-10-CM | POA: Insufficient documentation

## 2012-06-19 DIAGNOSIS — Z79899 Other long term (current) drug therapy: Secondary | ICD-10-CM | POA: Insufficient documentation

## 2012-06-19 DIAGNOSIS — J45909 Unspecified asthma, uncomplicated: Secondary | ICD-10-CM | POA: Insufficient documentation

## 2012-06-19 LAB — POCT I-STAT, CHEM 8
BUN: 17 mg/dL (ref 6–23)
Calcium, Ion: 1.31 mmol/L — ABNORMAL HIGH (ref 1.12–1.23)
Glucose, Bld: 103 mg/dL — ABNORMAL HIGH (ref 70–99)
TCO2: 27 mmol/L (ref 0–100)

## 2012-06-19 LAB — CBC WITH DIFFERENTIAL/PLATELET
Hemoglobin: 15.7 g/dL — ABNORMAL HIGH (ref 12.0–15.0)
Lymphocytes Relative: 25 % (ref 12–46)
Lymphs Abs: 1.6 10*3/uL (ref 0.7–4.0)
Monocytes Relative: 6 % (ref 3–12)
Neutro Abs: 4.3 10*3/uL (ref 1.7–7.7)
Neutrophils Relative %: 68 % (ref 43–77)
RBC: 5.32 MIL/uL — ABNORMAL HIGH (ref 3.87–5.11)
WBC: 6.3 10*3/uL (ref 4.0–10.5)

## 2012-06-19 LAB — COMPREHENSIVE METABOLIC PANEL
ALT: 18 U/L (ref 0–35)
Albumin: 4.3 g/dL (ref 3.5–5.2)
Alkaline Phosphatase: 116 U/L (ref 39–117)
BUN: 17 mg/dL (ref 6–23)
Chloride: 100 mEq/L (ref 96–112)
GFR calc Af Amer: >90 mL/min (ref >90)
Glucose, Bld: 95 mg/dL (ref 70–99)
Potassium: 4.2 mEq/L (ref 3.5–5.1)
Sodium: 138 mEq/L (ref 135–145)
Total Bilirubin: 0.4 mg/dL (ref 0.3–1.2)

## 2012-06-19 LAB — D-DIMER, QUANTITATIVE: D-Dimer, Quant: 0.82 ug/mL-FEU — ABNORMAL HIGH (ref 0.00–0.48)

## 2012-06-19 LAB — POCT I-STAT TROPONIN I: Troponin i, poc: 0 ng/mL (ref 0.00–0.08)

## 2012-06-19 MED ORDER — IOHEXOL 350 MG/ML SOLN
100.0000 mL | Freq: Once | INTRAVENOUS | Status: AC | PRN
  Administered 2012-06-19: 80 mL via INTRAVENOUS

## 2012-06-19 NOTE — ED Notes (Signed)
Dr Ray at bedside. 

## 2012-06-19 NOTE — Telephone Encounter (Signed)
Follow Up  Pt states is BP 175/100. She took to Triam last night and its higher than every before. She states her ankles are swollen, she has blurred vision, shortness of breath when she climbs the steps and she has a headache.

## 2012-06-19 NOTE — Telephone Encounter (Signed)
Will forward to Dr Hochrein for review and orders 

## 2012-06-19 NOTE — ED Notes (Signed)
Discharge instructions reviewed with pt. Pt verbalized understanding.   

## 2012-06-19 NOTE — Telephone Encounter (Signed)
Spoke with daughter regarding the high BP; she states has spoken with [facility] Dr. Jenene Slicker nurse regarding pt's issues. Pt's daughter is aware that there are no openings on the PA schedule for  today' pt states will take pt to the ER as recommended by Pam.

## 2012-06-19 NOTE — ED Notes (Signed)
Has had a h/a x 1 month  Ankles swollen and she has been dizzy w/ blurryy vision started taking her bp and it has been,  She told her daughter who works on [address] and she came to er at her urging.

## 2012-06-19 NOTE — Telephone Encounter (Signed)
New Prob     States pt is experiencing SOB, +2 edema in lower extremities, BP 185/100. Would like to speak to nurse regarding getting pt in.

## 2012-06-19 NOTE — Telephone Encounter (Signed)
I was told this AM by another RN in the hospital (daughter works there) the pt was going to the ED to be evaluated.  Have now spoken with both the pt and the daughter.  They are aware Dr Antoine Poche can not see her today as he is in [place] seeing pts.  She was given the options of 1. Calling primary care to treatment HTN 2. Going to the ED for eval 3. Being seen in the [place] office  By Dr Antoine Poche tomorrow afternoon.  Pt daughter she will take her to the ED for evaluation.

## 2012-06-19 NOTE — ED Provider Notes (Signed)
History     CSN: [id number]  Arrival date & time 06/19/12  1301   First MD Initiated Contact with Patient 06/19/12 1419      Chief Complaint  Patient presents with  . Hypertension    (Consider location/radiation/quality/duration/timing/severity/associated sxs/prior treatment) HPI Patient complaining of hypertension states bp was high at 175/100.  Patient took two maxzide last night because bp elevated yesterday.  Bp meds managed by Dr. Antoine Poche.  States she does not get bp checked since October.  Patient has had edema and sob on exertion Monday and bp was 150/96.  Daughter, who is nurse, spoke with [place] office and advised to double maxide.  Patient with bilateral ankle swelling for 2 months.  Patient has had some dyspnea especially with climbing steps for two months.  Headache began about a month ago with pressure bitemporally constant, no improving or worsenign factors.  Has taken excedrin without relief.  History of premenopausal migraines.  No chest pain.  History of phlebitis, no history od dvt or pe.  Past Medical History  Diagnosis Date  . 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 Hypertension   . Depression   . Lower extremity deep venous thrombosis   . Dyslipidemia   . Psychiatric hospitalization 05/2008  . Asthma   . Coronary artery disease   . Anxiety     Past Surgical History  Procedure Laterality Date  . Breast mass excision      aGe 57, benign tumor  . Knee surgery      right, removed cartilage  . Tubal ligation    . Tonsillectomy    . Dilation and curettage of uterus      Family History  Problem Relation Age of Onset  . Breast cancer Mother 21  . Alcohol abuse Mother   . Alcohol abuse Father   . Alcohol abuse Maternal Grandfather   . Drug abuse Maternal Grandfather   . Alcohol abuse Maternal Grandmother   . Alcohol abuse Paternal Grandfather   . Alcohol abuse Paternal Grandmother   . Schizophrenia Cousin     History   Substance Use Topics  . Smoking status: Never Smoker   . Smokeless tobacco: Never Used  . Alcohol Use: 0.0 oz/week    0 Glasses of wine per week     Comment: occ.    OB History   Grav Para Term Preterm Abortions TAB SAB Ect Mult Living                  Review of Systems  All other systems reviewed and are negative.    Allergies  Lamictal and Eggs or egg-derived products  Home Medications   Current Outpatient Rx  Name  Route  Sig  Dispense  Refill  . aspirin 81 MG tablet   Oral   Take 81 mg by mouth daily.           . clonazePAM (KLONOPIN) 0.5 MG tablet   Oral   Take 0.5 mg by mouth daily as needed. For anxiety         . clopidogrel (PLAVIX) 75 MG tablet      TAKE 1 TABLET BY MOUTH EVERY DAY   30 tablet   6   . divalproex (DEPAKOTE ER) 500 MG 24 hr tablet   Oral   Take 1,000 mg by mouth at bedtime.         [name] lithium carbonate 300 MG capsule   Oral  Take 300 mg by mouth 2 (two) times daily with a meal.         . Multiple Vitamin (MULTIVITAMIN) tablet   Oral   Take 1 tablet by mouth daily.           . nitroGLYCERIN (NITROSTAT) 0.4 MG SL tablet   Sublingual   Place 1 tablet (0.4 mg total) under the tongue every 5 (five) minutes as needed for chest pain. For chest pain   25 tablet   3   . simvastatin (ZOCOR) 20 MG tablet   Oral   Take 1 tablet (20 mg total) by mouth at bedtime.   30 tablet   3   . traZODone (DESYREL) 50 MG tablet   Oral   Take 100 mg by mouth at bedtime. Take 1/2 to 1 tab at bedtime         . triamterene-hydrochlorothiazide (MAXZIDE-25) 37.5-25 MG per tablet      TAKE 1 TABLET BY MOUTH EVERY DAY   90 tablet   2     BP 154/87  Pulse 78  Temp(Src) 98.1 F (36.7 C) (Oral)  Resp 16  SpO2 100%  Physical Exam  Nursing note and vitals reviewed. Constitutional: She is oriented to person, place, and time. She appears well-developed and well-nourished.  HENT:  Head: Normocephalic and atraumatic.  Right Ear:  External ear normal.  Nose: Nose normal.  Mouth/Throat: Oropharynx is clear and moist.  Eyes: Conjunctivae and EOM are normal. Pupils are equal, round, and reactive to light.  Neck: Normal range of motion. Neck supple.  Cardiovascular: Normal rate, regular rhythm and normal heart sounds.   Pulmonary/Chest: Effort normal and breath sounds normal.  Abdominal: Soft. Bowel sounds are normal.  Musculoskeletal: She exhibits edema.  Neurological: She is alert and oriented to person, place, and time.  Skin: Skin is warm and dry. Rash noted. No erythema.  Psychiatric: She has a normal mood and affect. Her behavior is normal. Judgment and thought content normal.    ED Course  Procedures (including critical care time)  Labs Reviewed  CBC WITH DIFFERENTIAL - Abnormal; Notable for the following:    RBC 5.32 (*)    Hemoglobin 15.7 (*)    All other components within normal limits  COMPREHENSIVE METABOLIC PANEL - Abnormal; Notable for the following:    Calcium 11.0 (*)    GFR calc non Af Amer 79 (*)    All other components within normal limits   No results found.   No diagnosis found.   Date: 06/19/2012  Rate: 77  Rhythm: normal sinus rhythm  QRS Axis: normal  Intervals: normal  ST/T Wave abnormalities: normal  Conduction Disutrbances: none  Narrative Interpretation: unremarkable     MDM  6:44 PM Patient back from cta chest and awaiting results.   Filed Vitals:   06/19/12 1600  BP: 136/86  Pulse: 83  Temp:   Resp: 14   Appears hemodynamically stable.    Dg Chest 2 View  06/19/2012   *RADIOLOGY REPORT*  Clinical Data: Shortness of breath, headache and hypertension.  CHEST - 2 VIEW  Comparison: 12/06/2011  Findings: Stable mild pulmonary interstitial prominence without evidence of edema, infiltrate or nodule.  No pleural fluid is seen. Cardiac and mediastinal contours are within normal limits.  Bony thorax shows stable degenerative changes of the thoracic spine.  IMPRESSION:  No active disease.  Stable mild pulmonary interstitial prominence.   Original Report Authenticated By: Irish Lack, M.D.   Ct Angio Chest  Pe W/cm &/or Wo Cm  06/19/2012   *RADIOLOGY REPORT*  Clinical Data: Dyspnea, hypertension and elevated D-dimer.  CT ANGIOGRAPHY CHEST  Technique:  Multidetector CT imaging of the chest using the standard protocol during bolus administration of intravenous contrast. Multiplanar reconstructed images including MIPs were obtained and reviewed to evaluate the vascular anatomy.  Contrast: 80mL OMNIPAQUE IOHEXOL 350 MG/ML SOLN  Comparison: 12/06/2011  Findings: The pulmonary arteries are adequately opacified.  There is no evidence of pulmonary embolism.  Lungs show no evidence of infiltrates or edema.  No pleural or pericardial fluid is identified.  No pulmonary nodules or enlarged lymph nodes.  Bony thorax shows degenerative changes of the thoracic spine.  IMPRESSION: No evidence of pulmonary embolism or other acute findings in the chest.   Original Report Authenticated By: Irish Lack, M.D.    Patient with increased sob, peripheral edema, and increased bp over several months.  She has history of dvt , listed in record, but states she was not treated with anticoagulants.  Patient with normal ecg, ct angio done due to elevated d-dimer, normal.  No evidence of pe, cardiac ischemia, or chf.  Patient advised close follow up with cardiologist.   Hilario Quarry, MD 06/19/12 [phone]

## 2012-06-19 NOTE — Telephone Encounter (Signed)
Follow up   Pt's daughter call because no one is calling her mom back. Please call pt

## 2012-06-20 ENCOUNTER — Other Ambulatory Visit: Payer: Self-pay | Admitting: Obstetrics and Gynecology

## 2012-06-20 DIAGNOSIS — R921 Mammographic calcification found on diagnostic imaging of breast: Secondary | ICD-10-CM

## 2012-06-24 ENCOUNTER — Encounter: Payer: Self-pay | Admitting: Nurse Practitioner

## 2012-06-24 ENCOUNTER — Ambulatory Visit (INDEPENDENT_AMBULATORY_CARE_PROVIDER_SITE_OTHER): Payer: BC Managed Care – PPO | Admitting: Nurse Practitioner

## 2012-06-24 VITALS — BP 142/90 | HR 80 | Ht 71.0 in | Wt 291.8 lb

## 2012-06-24 DIAGNOSIS — I10 Essential (primary) hypertension: Secondary | ICD-10-CM

## 2012-06-24 MED ORDER — LISINOPRIL 10 MG PO TABS: 10.0000 mg | ORAL_TABLET | Freq: Every day | ORAL | Status: DC

## 2012-06-24 NOTE — Patient Instructions (Addendum)
We are adding Lisinopril 10 mg a day for your blood pressure  See Dr. Beverely Low as planned later this month - you will need lab work that day to recheck your kidney function.   I will send her a note about our visit today  Try to cut back on your salt use  Monitor your blood pressure at home and keep a diary - bring this in to your upcoming visits.  See Dr. Antoine Poche in a 4 to 5 weeks  Call the [facility] office at [phone] if you have any questions, problems or concerns.

## 2012-06-24 NOTE — Progress Notes (Signed)
Kathleen [name] Date of Birth: 1955-12-07 Medical Record #[id number]  History of Present Illness: Kathleen [name] is seen back today for a post ER visit. Seen for Dr. Antoine Poche. Has morbid obesity, HTN, prior NSTEMI in 2009, negative dobutamine echo in November of 2013, depression, bipolar disorder, DVT (reportedly superficial), asthma and anxiety.   Last seen here in November. Had her stress test. Normal EF.   Has been to the ER last week with elevated BP for the past few weeks. No medicine changes made. Took an extra dose of her diuretic x 1. Has had a headache. More swelling in her legs. Feels DOE with steps. Weight is up 10 more pounds. Eating out and probably getting too much salt. Feels anxious. Seeing her PCP later this month. No chest pain. Readings from home are all high.    Current Outpatient Prescriptions on File Prior to Visit  Medication Sig Dispense Refill  . albuterol (PROVENTIL HFA;VENTOLIN HFA) 108 (90 BASE) MCG/ACT inhaler Inhale 2 puffs into the lungs every 6 (six) hours as needed for wheezing.      [name] ALPRAZolam (XANAX) 0.5 MG tablet Take 0.5 mg by mouth 2 (two) times daily as needed for anxiety.      [name] aspirin 81 MG tablet Take 81 mg by mouth at bedtime.       . clopidogrel (PLAVIX) 75 MG tablet Take 75 mg by mouth at bedtime.      . divalproex (DEPAKOTE ER) 500 MG 24 hr tablet Take 1,500 mg by mouth at bedtime.       [name] lithium carbonate 300 MG capsule Take 600 mg by mouth at bedtime.       . nitroGLYCERIN (NITROSTAT) 0.4 MG SL tablet Place 1 tablet (0.4 mg total) under the tongue every 5 (five) minutes as needed for chest pain. For chest pain  25 tablet  3  . simvastatin (ZOCOR) 20 MG tablet Take 1 tablet (20 mg total) by mouth at bedtime.  30 tablet  3  . triamterene-hydrochlorothiazide (DYAZIDE) 37.5-25 MG per capsule Take 1 capsule by mouth at bedtime.       No current facility-administered medications on file prior to visit.    Allergies  Allergen Reactions  . Lamictal  (Lamotrigine)     Rash   . Eggs Or Egg-Derived Products Hives    Past Medical History  Diagnosis Date  . 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 Hypertension   . Depression   . Lower extremity deep venous thrombosis   . Dyslipidemia   . Psychiatric hospitalization 05/2008  . Asthma   . Coronary artery disease   . Anxiety     Past Surgical History  Procedure Laterality Date  . Breast mass excision      aGe 37, benign tumor  . Knee surgery      right, removed cartilage  . Tubal ligation    . Tonsillectomy    . Dilation and curettage of uterus      History  Smoking status  . Never Smoker   Smokeless tobacco  . Never Used    History  Alcohol Use  . 0.0 oz/week  . 0 Glasses of wine per week    Comment: occ.    Family History  Problem Relation Age of Onset  . Breast cancer Mother 36  . Alcohol abuse Mother   . Alcohol abuse Father   . Alcohol abuse Maternal Grandfather   . Drug abuse Maternal  Grandfather   . Alcohol abuse Maternal Grandmother   . Alcohol abuse Paternal Grandfather   . Alcohol abuse Paternal Grandmother   . Schizophrenia Cousin     Review of Systems: The review of systems is per the HPI.  All other systems were reviewed and are negative.  Physical Exam: BP 142/90  Pulse 80  Ht 5\' 11"  (1.803 m)  Wt 291 lb 12.8 oz (132.36 kg)  BMI 40.72 kg/m2 Patient is very pleasant and in no acute distress. Skin is warm and dry. Color is normal.  HEENT is unremarkable. Normocephalic/atraumatic. PERRL. Sclera are nonicteric. Neck is supple. No masses. No JVD. Lungs are clear. Cardiac exam shows a regular rate and rhythm. Abdomen is soft. Extremities are without edema. Gait and ROM are intact. No gross neurologic deficits noted.  LABORATORY DATA:  Lab Results  Component Value Date   WBC 6.3 06/19/2012   HGB 15.6* 06/19/2012   HCT 46.0 06/19/2012   PLT 167 06/19/2012   GLUCOSE 103* 06/19/2012   CHOL 160 02/16/2012   TRIG 72.0  02/16/2012   HDL 62.60 02/16/2012   LDLCALC 83 02/16/2012   ALT 18 06/19/2012   AST 22 06/19/2012   NA 139 06/19/2012   K 4.3 06/19/2012   CL 105 06/19/2012   CREATININE 0.80 06/19/2012   BUN 17 06/19/2012   CO2 23 06/19/2012   TSH 2.03 02/16/2012   INR 1.01 12/07/2011   HGBA1C 5.1 12/07/2011     Assessment / Plan: 1. HTN - uncontrolled - I have added Lisinopril 10 mg a day. First dose tonight and then one each day. Her daughter will continue to monitor her BP at home. She needs to restrict her salt.   2. Morbid obesity   3. CAD - negative stress dobutamine echo in November. No chest pain reported. She has a normal EF.   Will add the Lisinopril. She is seeing her PCP later this month. Needs a BMET then. Dr. Antoine Poche will see back in 4 to 6 weeks.   Patient is agreeable to this plan and will call if any problems develop in the interim.   Rosalio Macadamia, RN, ANP-C [facility] [address] [place], [place]  [address]

## 2012-07-08 ENCOUNTER — Ambulatory Visit
Admission: RE | Admit: 2012-07-08 | Discharge: 2012-07-08 | Disposition: A | Discharge status: Home / Self Care | Payer: BC Managed Care – PPO | Source: Ambulatory Visit | Attending: Obstetrics and Gynecology | Admitting: Obstetrics and Gynecology

## 2012-07-08 ENCOUNTER — Other Ambulatory Visit: Payer: Self-pay | Admitting: Family Medicine

## 2012-07-08 DIAGNOSIS — R921 Mammographic calcification found on diagnostic imaging of breast: Secondary | ICD-10-CM

## 2012-07-10 NOTE — Telephone Encounter (Signed)
Rx sent to the pharmacy by e-script.//AB/CMA 

## 2012-07-11 ENCOUNTER — Ambulatory Visit (INDEPENDENT_AMBULATORY_CARE_PROVIDER_SITE_OTHER): Payer: BC Managed Care – PPO | Admitting: Family Medicine

## 2012-07-11 ENCOUNTER — Encounter: Payer: Self-pay | Admitting: Family Medicine

## 2012-07-11 VITALS — BP 110/80 | HR 80 | Temp 98.1°F | Ht 72.0 in | Wt 297.6 lb

## 2012-07-11 DIAGNOSIS — E785 Hyperlipidemia, unspecified: Secondary | ICD-10-CM

## 2012-07-11 DIAGNOSIS — I10 Essential (primary) hypertension: Secondary | ICD-10-CM

## 2012-07-11 DIAGNOSIS — N3941 Urge incontinence: Secondary | ICD-10-CM | POA: Insufficient documentation

## 2012-07-11 DIAGNOSIS — E669 Obesity, unspecified: Secondary | ICD-10-CM

## 2012-07-11 LAB — BASIC METABOLIC PANEL
CO2: 26 mEq/L (ref 19–32)
Calcium: 10.6 mg/dL — ABNORMAL HIGH (ref 8.4–10.5)
Creatinine, Ser: 0.9 mg/dL (ref 0.4–1.2)
Sodium: 138 mEq/L (ref 135–145)

## 2012-07-11 LAB — LIPID PANEL
Cholesterol: 196 mg/dL (ref 0–200)
LDL Cholesterol: 99 mg/dL (ref 0–99)
Total CHOL/HDL Ratio: 3

## 2012-07-11 LAB — HEPATIC FUNCTION PANEL
ALT: 15 U/L (ref 0–35)
AST: 16 U/L (ref 0–37)
Total Bilirubin: 0.4 mg/dL (ref 0.3–1.2)

## 2012-07-11 MED ORDER — SOLIFENACIN SUCCINATE 5 MG PO TABS: 5.0000 mg | ORAL_TABLET | Freq: Every day | ORAL | Status: DC

## 2012-07-11 NOTE — Patient Instructions (Addendum)
Follow up in 1 month to recheck BP Decrease the Lisinopril to 1/2 tab Make sure you are drinking plenty of water Try and get regular exercise Think about a nutrition plan that would work for you- I like [employer] and [employer] call you with your Nutrition appt Start the [place] daily for the bladder Call with any questions or concerns Hang in there!

## 2012-07-11 NOTE — Assessment & Plan Note (Signed)
New.  Start vesicare and see if sxs improve.  Start w/ low dose to minimize side effects.  Will follow.  If no improvement, will refer to urology.

## 2012-07-11 NOTE — Assessment & Plan Note (Signed)
Chronic problem.  Well controlled today w/ addition of Lisinopril.  Pt having visual changes w/ position changes and suspect she is having some hypotensive sxs.  Will decrease Lisinopril to 1/2 tab, monitor BP closely, and see if sxs improve.  Check BMP.

## 2012-07-11 NOTE — Progress Notes (Signed)
  Subjective:    Patient ID: Kathleen [name], female    DOB: 04/18/1955, 57 y.o.   MRN: [id number]  HPI HTN- chronic problem.  Recently saw Cards and Lisinopril was added to Dyazide.  Due for BMP.  No CP, SOB above baseline, edema.  + stress HAs.  Seeing 'white spots' in visual fields w/ position changes since adding Lisinopril.    Hyperlipidemia- chronic problem, on Simvastatin.  No abd pain, N/V, myalgias.  Urinary incontinence- has sensation of full bladder, 'i'll go running to the rest room' and 'it's a dribble'.  No stress incontinence.  Is having occasional accidents.  Now wearing mini-pads.  Obesity- pt was able to lose 70 lbs counting calories but has gained back 110 lbs.  Went to bariatric seminar and is considering this as an option.   Review of Systems For ROS see HPI     Objective:   Physical Exam  Vitals reviewed. Constitutional: She is oriented to person, place, and time. She appears well-developed and well-nourished. No distress.  obese  HENT:  Head: Normocephalic and atraumatic.  Eyes: Conjunctivae and EOM are normal. Pupils are equal, round, and reactive to light.  Neck: Normal range of motion. Neck supple. No thyromegaly present.  Cardiovascular: Normal rate, regular rhythm, normal heart sounds and intact distal pulses.   No murmur heard. Pulmonary/Chest: Effort normal and breath sounds normal. No respiratory distress.  Abdominal: Soft. She exhibits no distension. There is no tenderness.  Musculoskeletal: She exhibits no edema.  Lymphadenopathy:    She has no cervical adenopathy.  Neurological: She is alert and oriented to person, place, and time.  Skin: Skin is warm and dry.  Psychiatric: She has a normal mood and affect. Her behavior is normal.          Assessment & Plan:

## 2012-07-11 NOTE — Assessment & Plan Note (Signed)
Chronic problem.  Tolerating statin w/out difficulty.  Check labs.  Adjust meds prn  

## 2012-07-11 NOTE — Assessment & Plan Note (Signed)
Chronic problem for pt.  Continues to gain weight- due in large part to psych meds.  Pt has attended [facility] bariatric surgery seminar and is interested in doing the 6 months of supervised weight loss attempts and trying to keep surgery on the back burner.  Will refer to nutrition.  Discussed various diet options.  Encouraged regular exercise.  Will follow.

## 2012-07-15 ENCOUNTER — Other Ambulatory Visit: Payer: Self-pay | Admitting: Obstetrics and Gynecology

## 2012-07-15 DIAGNOSIS — Z803 Family history of malignant neoplasm of breast: Secondary | ICD-10-CM

## 2012-07-18 ENCOUNTER — Ambulatory Visit (INDEPENDENT_AMBULATORY_CARE_PROVIDER_SITE_OTHER): Payer: BC Managed Care – PPO | Admitting: General Surgery

## 2012-07-23 ENCOUNTER — Ambulatory Visit
Admission: RE | Admit: 2012-07-23 | Discharge: 2012-07-23 | Disposition: A | Discharge status: Home / Self Care | Payer: BC Managed Care – PPO | Source: Ambulatory Visit | Attending: Obstetrics and Gynecology | Admitting: Obstetrics and Gynecology

## 2012-07-23 DIAGNOSIS — Z803 Family history of malignant neoplasm of breast: Secondary | ICD-10-CM

## 2012-07-23 MED ORDER — GADOBENATE DIMEGLUMINE 529 MG/ML IV SOLN
20.0000 mL | Freq: Once | INTRAVENOUS | Status: AC | PRN
  Administered 2012-07-23: 20 mL via INTRAVENOUS

## 2012-07-29 ENCOUNTER — Encounter: Payer: Self-pay | Admitting: Cardiology

## 2012-07-29 ENCOUNTER — Ambulatory Visit (INDEPENDENT_AMBULATORY_CARE_PROVIDER_SITE_OTHER): Payer: BC Managed Care – PPO | Admitting: Cardiology

## 2012-07-29 VITALS — BP 122/64 | HR 88 | Ht 72.0 in | Wt 294.8 lb

## 2012-07-29 DIAGNOSIS — R0789 Other chest pain: Secondary | ICD-10-CM

## 2012-07-29 DIAGNOSIS — R072 Precordial pain: Secondary | ICD-10-CM

## 2012-07-29 DIAGNOSIS — I2510 Atherosclerotic heart disease of native coronary artery without angina pectoris: Secondary | ICD-10-CM

## 2012-07-29 NOTE — Progress Notes (Signed)
HPI The patient presents for follow up of her CAD and elevated BP.  She was seen in the ER several weeks ago with increased BP.  She was seen in this clinic and had lisinopril added to her meds.   She had some orthostatic symptoms with 10 mg and had the dose reduced to 5 mg.  Since then she has had no complaints.  The patient denies any new symptoms such as chest discomfort, neck or arm discomfort. There has been no new shortness of breath, PND or orthopnea. There have been no reported palpitations, presyncope or syncope.    Allergies  Allergen Reactions  . Lamictal (Lamotrigine)     Rash   . Eggs Or Egg-Derived Products Hives    Current Outpatient Prescriptions  Medication Sig Dispense Refill  . albuterol (PROVENTIL HFA;VENTOLIN HFA) 108 (90 BASE) MCG/ACT inhaler Inhale 2 puffs into the lungs every 6 (six) hours as needed for wheezing.      [name] ALPRAZolam (XANAX) 0.5 MG tablet Take 0.5 mg by mouth 2 (two) times daily as needed for anxiety.      [name] aspirin 81 MG tablet Take 81 mg by mouth at bedtime.       . clopidogrel (PLAVIX) 75 MG tablet Take 75 mg by mouth at bedtime.      . divalproex (DEPAKOTE ER) 500 MG 24 hr tablet Take 1,500 mg by mouth at bedtime.       [name] lisinopril (PRINIVIL,ZESTRIL) 10 MG tablet Take 1 tablet (10 mg total) by mouth daily.  30 tablet  3  . lithium carbonate 300 MG capsule Take 300 mg by mouth at bedtime.       . nitroGLYCERIN (NITROSTAT) 0.4 MG SL tablet Place 1 tablet (0.4 mg total) under the tongue every 5 (five) minutes as needed for chest pain. For chest pain  25 tablet  3  . simvastatin (ZOCOR) 20 MG tablet TAKE 1 TABLET (20 MG TOTAL) BY MOUTH AT BEDTIME.  90 tablet  1  . solifenacin (VESICARE) 5 MG tablet Take 1 tablet (5 mg total) by mouth daily.  30 tablet  6  . triamterene-hydrochlorothiazide (DYAZIDE) 37.5-25 MG per capsule Take 1 capsule by mouth at bedtime.       No current facility-administered medications for this visit.    Past Medical History    Diagnosis Date  . 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 Hypertension   . Depression   . Lower extremity deep venous thrombosis   . Dyslipidemia   . Psychiatric hospitalization 05/2008  . Asthma   . Coronary artery disease   . Anxiety     Past Surgical History  Procedure Laterality Date  . Breast mass excision      aGe 50, benign tumor  . Knee surgery      right, removed cartilage  . Tubal ligation    . Tonsillectomy    . Dilation and curettage of uterus      ROS:  As stated in the HPI and negative for all other systems.  PHYSICAL EXAM BP 122/64  Pulse 88  Ht 6' (1.829 m)  Wt 294 lb 12.8 oz (133.72 kg)  BMI 39.97 kg/m2  SpO2 99% GENERAL:  Well appearing HEENT:  Pupils equal round and reactive, fundi not visualized, oral mucosa unremarkable NECK:  No jugular venous distention, waveform within normal limits, carotid upstroke brisk and symmetric, no bruits, no thyromegaly LUNGS:  Clear to auscultation bilaterally CHEST:  Unremarkable HEART:  PMI not displaced or sustained,S1 and S2 within normal limits, no S3, no S4, no clicks, no rubs, no murmurs ABD:  Flat, positive bowel sounds normal in frequency in pitch, no bruits, no rebound, no guarding, no midline pulsatile mass, no hepatomegaly, no splenomegaly EXT:  2 plus pulses throughout, no edema, no cyanosis no clubbing, chronic venous stasis changes.     ASSESSMENT AND PLAN  CAD (coronary artery disease) -  The patient has no new sypmtoms. No further cardiovascular testing is indicated. We will continue with aggressive risk reduction and meds as listed.   I agree with her that this could be panic and she is do to have follow up with her psychiatrist next week.   DYSLIPIDEMIA -  Her lipid profile was excellent with an LDL of 99 and HDL of 70. She will continue on the meds as listed.  HYPERTENSION -  The blood pressure is at target. No change in medications is indicated. We will continue  with therapeutic lifestyle changes (TLC).

## 2012-07-29 NOTE — Patient Instructions (Addendum)
The current medical regimen is effective;  continue present plan and medications.  Follow up in 6 months with Dr Hochrein.  You will receive a letter in the mail 2 months before you are due.  Please call us when you receive this letter to schedule your follow up appointment.  

## 2012-08-08 ENCOUNTER — Encounter: Payer: BC Managed Care – PPO | Attending: Family Medicine | Admitting: *Deleted

## 2012-08-08 ENCOUNTER — Encounter: Payer: Self-pay | Admitting: *Deleted

## 2012-08-08 NOTE — Patient Instructions (Signed)
   Follow Pre-Op Nutrition Goals to prepare for Lapband Surgery.   Call [facility] at [phone] once you have been given your surgery date to enrolled in the Pre-Op Nutrition Class. You will need to attend this nutrition class 3-4 weeks prior to your surgery. 

## 2012-08-09 ENCOUNTER — Encounter (INDEPENDENT_AMBULATORY_CARE_PROVIDER_SITE_OTHER): Payer: Self-pay

## 2012-08-09 ENCOUNTER — Encounter (INDEPENDENT_AMBULATORY_CARE_PROVIDER_SITE_OTHER): Payer: Self-pay | Admitting: General Surgery

## 2012-08-09 ENCOUNTER — Ambulatory Visit (INDEPENDENT_AMBULATORY_CARE_PROVIDER_SITE_OTHER): Payer: BC Managed Care – PPO | Admitting: General Surgery

## 2012-08-09 NOTE — Progress Notes (Signed)
Subjective:   morbid obesity  Patient ID: LYFE REIHL, female   DOB: Oct 25, 1955, 57 y.o.   MRN: [id number]  HPI Kathleen [name] [id number] y.o.female presents for consideration for surgical treatment for morbid obesity.  she  gives a history of progressive obesity since early adulthood. She was actually very thin through her teenage years. With regnancies and with previous episodes of depression she has had very significant weight gain and has been unable to have any significant long-term weight loss despite multiple attempts at medical management.  her weight has been affecting her in a number of ways including the development of significant health issues which concerned her including coronary artery disease, hypertension, hyperlipidemia and chronic joint pain and urinary incontinence.. She is very concerned about her long-term health risks going forward and her current weight..   she has been to our initial information seminar, researched surgical options thoroughly and is interested in lap band surgery due to its safety and reversibility.  Review of Systems  Constitutional: Positive for fatigue. Negative for fever and chills.  HENT: Positive for hearing loss. Negative for rhinorrhea and trouble swallowing.   Eyes: Negative.   Respiratory: Positive for shortness of breath (On exertion, can climb 1 flight of stairs). Negative for apnea, choking, chest tightness, wheezing and stridor.   Cardiovascular: Negative.   Gastrointestinal: Negative.   Genitourinary:       Stress incontinence  Psychiatric/Behavioral: Positive for dysphoric mood.       Objective:   Physical Exam BP 142/86  Pulse 82  Temp(Src) 98.3 F (36.8 C) (Temporal)  Resp 16  Ht 5\' 11"  (1.803 m)  Wt 294 lb 9.6 oz (133.63 kg)  BMI 41.11 kg/m2 General: Alert, morbidly obese Caucasian female, in no distress Skin: Warm and dry without rash or infection. HEENT: No palpable masses or thyromegaly. Sclera nonicteric. Pupils equal round and  reactive. Oropharynx clear. Lymph nodes: No cervical, supraclavicular, or inguinal nodes palpable. Lungs: Breath sounds clear and equal without increased work of breathing Cardiovascular: Regular rate and rhythm without murmur. No JVD or edema. Peripheral pulses intact. Abdomen: Nondistended. Soft and nontender. No masses palpable. No organomegaly. No palpable hernias. Extremities: No edema or joint swelling or deformity. Some scattered petechiae and ecchymoses below the knee.  Neurologic: Alert and fully oriented. Gait normal.    Assessment:     Patient with progressive morbid obesity unresponsive to multiple efforts at medical management who presents with a BMI of 41 and comorbidities of coronary artery disease, joint pain, hyperlipidemia, hypertension and stress urinary incontinence.. I believe there would be very significant medical benefit from surgical weight loss. After our discussion of surgical options currently available the patient has decided to proceed with LAP-BAND placement due to the reasons above. We have discussed the nature of morbid obesity and the risk of remaining obese. We discussed the indications for the procedure, its nature, and expected recovery. The risks of the procedure were discussed in detail including anesthetic complications, bleeding, infection, visceral injury, dysphagia, and long-term risks of mechanical failure, slippage, erosion, esophageal dilatation and failure to lose weight. Rare risk of death was discussed. The patient was given a complete consent form and all questions were answered. We will proceed with preoperative workup including routine lab and x-rays, psychologic and nutrition evaluations, and upper GI series.  I will see the patient back following this evaluation.        Plan:     Begin preoperative workup and planning for lap band surgery.

## 2012-08-13 ENCOUNTER — Ambulatory Visit (INDEPENDENT_AMBULATORY_CARE_PROVIDER_SITE_OTHER): Payer: BC Managed Care – PPO | Admitting: Family Medicine

## 2012-08-13 ENCOUNTER — Encounter: Payer: Self-pay | Admitting: Family Medicine

## 2012-08-13 VITALS — BP 130/80 | HR 74 | Temp 98.1°F | Ht 72.0 in | Wt 293.2 lb

## 2012-08-13 DIAGNOSIS — I10 Essential (primary) hypertension: Secondary | ICD-10-CM

## 2012-08-13 DIAGNOSIS — J019 Acute sinusitis, unspecified: Secondary | ICD-10-CM

## 2012-08-13 LAB — CBC WITH DIFFERENTIAL/PLATELET
Hemoglobin: 14.7 g/dL (ref 12.0–15.0)
Lymphocytes Relative: 23 % (ref 12–46)
Lymphs Abs: 1.2 10*3/uL (ref 0.7–4.0)
MCH: 30.1 pg (ref 26.0–34.0)
Monocytes Relative: 6 % (ref 3–12)
Neutro Abs: 3.6 10*3/uL (ref 1.7–7.7)
Neutrophils Relative %: 68 % (ref 43–77)
RBC: 4.88 MIL/uL (ref 3.87–5.11)
WBC: 5.3 10*3/uL (ref 4.0–10.5)

## 2012-08-13 LAB — T4: T4, Total: 8.6 ug/dL (ref 5.0–12.5)

## 2012-08-13 LAB — TSH: TSH: 2.938 u[IU]/mL (ref 0.350–4.500)

## 2012-08-13 MED ORDER — AMOXICILLIN 875 MG PO TABS: 875.0000 mg | ORAL_TABLET | Freq: Two times a day (BID) | ORAL | Status: DC

## 2012-08-13 NOTE — Assessment & Plan Note (Signed)
Chronic problem.  Pt was hypotensive at last visit.  BP much improved since decreasing lisinopril.  Asymptomatic.  No changes.

## 2012-08-13 NOTE — Progress Notes (Signed)
  Subjective:    Patient ID: Kathleen [name], female    DOB: 12/03/1955, 57 y.o.   MRN: [id number]  HPI HTN- pt was hypotensive at last visit and symptomatic.  Lisinopril was decreased to 1/2 tab.  URI- sxs started 2 weeks ago.  'i have a bad cold'.  + fevers.  + sinus pain/pressure.  + tooth pain, 'eyeballs too'.  Bilateral ear pain.  + productive cough- 'green icky stuff'.   Review of Systems For ROS see HPI     Objective:   Physical Exam  Vitals reviewed. Constitutional: She appears well-developed and well-nourished. No distress.  HENT:  Head: Normocephalic and atraumatic.  Right Ear: Tympanic membrane normal.  Left Ear: Tympanic membrane normal.  Nose: Mucosal edema and rhinorrhea present. Right sinus exhibits maxillary sinus tenderness and frontal sinus tenderness. Left sinus exhibits maxillary sinus tenderness and frontal sinus tenderness.  Mouth/Throat: Uvula is midline and mucous membranes are normal. Posterior oropharyngeal erythema present. No oropharyngeal exudate.  Eyes: Conjunctivae and EOM are normal. Pupils are equal, round, and reactive to light.  Neck: Normal range of motion. Neck supple.  Cardiovascular: Normal rate, regular rhythm and normal heart sounds.   Pulmonary/Chest: Effort normal and breath sounds normal. No respiratory distress. She has no wheezes.  Lymphadenopathy:    She has no cervical adenopathy.       Assessment & Plan:

## 2012-08-13 NOTE — Assessment & Plan Note (Signed)
Pt's sxs and PE consistent w/ infxn.  Start abx.  Reviewed supportive care and red flags that should prompt return.  Pt expressed understanding and is in agreement w/ plan.  

## 2012-08-13 NOTE — Patient Instructions (Addendum)
Continue the 1/2 tab of Lisinopril daily Start the Amox twice daily- take w/ food- for the sinus infxn Drink plenty of fluids Mucinex DM for cough and chest congestion REST! Have a great trip!!!

## 2012-08-23 LAB — HM MAMMOGRAPHY

## 2012-08-28 ENCOUNTER — Other Ambulatory Visit: Payer: Self-pay | Admitting: Cardiology

## 2012-09-03 ENCOUNTER — Encounter (INDEPENDENT_AMBULATORY_CARE_PROVIDER_SITE_OTHER): Payer: Self-pay | Admitting: General Surgery

## 2012-09-04 ENCOUNTER — Ambulatory Visit (HOSPITAL_COMMUNITY)
Admission: RE | Admit: 2012-09-04 | Discharge: 2012-09-04 | Disposition: A | Discharge status: Home / Self Care | Payer: BC Managed Care – PPO | Source: Ambulatory Visit | Attending: General Surgery | Admitting: General Surgery

## 2012-09-04 DIAGNOSIS — Z6841 Body Mass Index (BMI) 40.0 and over, adult: Secondary | ICD-10-CM | POA: Insufficient documentation

## 2012-09-04 DIAGNOSIS — I10 Essential (primary) hypertension: Secondary | ICD-10-CM | POA: Insufficient documentation

## 2012-09-04 DIAGNOSIS — E785 Hyperlipidemia, unspecified: Secondary | ICD-10-CM | POA: Insufficient documentation

## 2012-09-04 DIAGNOSIS — N393 Stress incontinence (female) (male): Secondary | ICD-10-CM | POA: Insufficient documentation

## 2012-09-04 DIAGNOSIS — I2510 Atherosclerotic heart disease of native coronary artery without angina pectoris: Secondary | ICD-10-CM | POA: Insufficient documentation

## 2012-09-04 DIAGNOSIS — Z01818 Encounter for other preprocedural examination: Secondary | ICD-10-CM | POA: Insufficient documentation

## 2012-09-10 ENCOUNTER — Encounter (INDEPENDENT_AMBULATORY_CARE_PROVIDER_SITE_OTHER): Payer: Self-pay | Admitting: General Surgery

## 2012-10-11 ENCOUNTER — Encounter: Payer: Self-pay | Admitting: Family Medicine

## 2012-10-11 ENCOUNTER — Ambulatory Visit (INDEPENDENT_AMBULATORY_CARE_PROVIDER_SITE_OTHER): Payer: BC Managed Care – PPO | Admitting: Family Medicine

## 2012-10-11 VITALS — BP 130/84 | HR 91 | Temp 98.5°F | Wt 305.0 lb

## 2012-10-11 DIAGNOSIS — D485 Neoplasm of uncertain behavior of skin: Secondary | ICD-10-CM

## 2012-10-11 DIAGNOSIS — L02419 Cutaneous abscess of limb, unspecified: Secondary | ICD-10-CM

## 2012-10-11 DIAGNOSIS — L03115 Cellulitis of right lower limb: Secondary | ICD-10-CM | POA: Insufficient documentation

## 2012-10-11 MED ORDER — CEPHALEXIN 500 MG PO CAPS: 500.0000 mg | ORAL_CAPSULE | Freq: Two times a day (BID) | ORAL | Status: AC

## 2012-10-11 MED ORDER — TRIAMCINOLONE ACETONIDE 0.1 % EX OINT: TOPICAL_OINTMENT | Freq: Two times a day (BID) | CUTANEOUS | Status: DC

## 2012-10-11 NOTE — Patient Instructions (Addendum)
Start the Keflex for the cellulitis (infection) Use the Triamcinolone ointment twice daily Please call Derm and have them re-evaluate this (and send me their note so I can follow along) Call with any questions or concerns Have a great weekend!

## 2012-10-11 NOTE — Progress Notes (Signed)
  Subjective:    Patient ID: Kathleen [name], female    DOB: [birth year], 57 y.o.   MRN: [id number]  HPI R lower leg skin lesion- has been present for years but recently started bleeding (~1 month ago).  Area is now painful- described as 'stinging'.  Not itchy.  No purulent drainage.  Has seen derm (Dr Sharyn Lull) and was told to wear support hose.     Review of Systems For ROS see HPI     Objective:   Physical Exam  Vitals reviewed. Constitutional: She is oriented to person, place, and time. She appears well-developed and well-nourished. No distress.  Cardiovascular: Intact distal pulses.   Neurological: She is alert and oriented to person, place, and time.  Skin: Skin is warm and dry. There is erythema (pt w/ apparent chronic venous stasis changes w/ scabbing and surrounding erythema and TTP).  Psychiatric: She has a normal mood and affect. Her behavior is normal.          Assessment & Plan:

## 2012-10-14 NOTE — Assessment & Plan Note (Signed)
New.  Start abx due to surrounding redness and TTP of pt's R medial lower leg.  Pt reports this area was previously evaluated by derm (no notes on this).  Pt to schedule appt w/ Dr Sharyn Lull for re-eval.  Will follow closely.

## 2012-10-15 ENCOUNTER — Encounter: Payer: Self-pay | Admitting: Family Medicine

## 2012-10-15 DIAGNOSIS — Z0279 Encounter for issue of other medical certificate: Secondary | ICD-10-CM

## 2012-10-23 ENCOUNTER — Encounter (HOSPITAL_BASED_OUTPATIENT_CLINIC_OR_DEPARTMENT_OTHER): Payer: BC Managed Care – PPO | Attending: General Surgery

## 2012-10-23 DIAGNOSIS — I10 Essential (primary) hypertension: Secondary | ICD-10-CM | POA: Insufficient documentation

## 2012-10-23 DIAGNOSIS — R233 Spontaneous ecchymoses: Secondary | ICD-10-CM | POA: Insufficient documentation

## 2012-10-23 DIAGNOSIS — Z79899 Other long term (current) drug therapy: Secondary | ICD-10-CM | POA: Insufficient documentation

## 2012-10-23 DIAGNOSIS — I87309 Chronic venous hypertension (idiopathic) without complications of unspecified lower extremity: Secondary | ICD-10-CM | POA: Insufficient documentation

## 2012-10-23 DIAGNOSIS — Z7982 Long term (current) use of aspirin: Secondary | ICD-10-CM | POA: Insufficient documentation

## 2012-10-23 DIAGNOSIS — E669 Obesity, unspecified: Secondary | ICD-10-CM | POA: Insufficient documentation

## 2012-10-24 ENCOUNTER — Encounter (HOSPITAL_BASED_OUTPATIENT_CLINIC_OR_DEPARTMENT_OTHER): Payer: BC Managed Care – PPO

## 2012-10-24 NOTE — Progress Notes (Signed)
[facility]  NAME:  [name], [name] NO.:  [id number]  MEDICAL RECORD NO.:  [id number]      DATE OF BIRTH:  Dec 02, 1955  PHYSICIAN:  Ardath Sax, M.D.           VISIT DATE:                                  OFFICE VISIT   This is a moderately obese 57 year old Caucasian female, who comes to [place] because of petechiae and some redness on the anterior aspect of both of her lower legs.  I can feel pulses in her feet, and she is not a diabetic.  She does have a history of hypertension.  She is on aspirin, lisinopril, nitroglycerin, simvastatin, hydrochlorothiazide, and albuterol and at the present time she is on Keflex because of this condition on her legs.  When we examine her today, she had a blood pressure of 113/81, respirations 17, pulse is 88, temp 98.  She weighs 310 pounds.  I felt like she really did not have any ulcers, but she did have some edema and she did have a look of venous hypertension of her legs, so are going to put her in compression stockings and will try to order her the just the right and we put triamcinolone cream and rash on her legs, which is really caudal like petechiae.  So, I think her problem really is from venous hypertension, but her overall diagnosis is; 1. Obesity. 2. Venous hypertension. 3. Hypertension. 4. Petechiae that almost look allergic phenomena around her lower     legs.     Ardath Sax, M.D.     PP/MEDQ  D:  10/23/2012  T:  10/24/2012  Job:  [id number]

## 2012-11-12 ENCOUNTER — Encounter: Payer: Self-pay | Admitting: Family Medicine

## 2012-11-12 ENCOUNTER — Ambulatory Visit (INDEPENDENT_AMBULATORY_CARE_PROVIDER_SITE_OTHER): Payer: BC Managed Care – PPO | Admitting: Family Medicine

## 2012-11-12 VITALS — BP 122/76 | HR 84 | Temp 98.8°F | Wt 303.0 lb

## 2012-11-12 DIAGNOSIS — J209 Acute bronchitis, unspecified: Secondary | ICD-10-CM

## 2012-11-12 DIAGNOSIS — J019 Acute sinusitis, unspecified: Secondary | ICD-10-CM

## 2012-11-12 DIAGNOSIS — R062 Wheezing: Secondary | ICD-10-CM

## 2012-11-12 MED ORDER — AZELASTINE-FLUTICASONE 137-50 MCG/ACT NA SUSP: 1.0000 | Freq: Two times a day (BID) | NASAL | Status: DC

## 2012-11-12 MED ORDER — ALBUTEROL SULFATE (2.5 MG/3ML) 0.083% IN NEBU
2.5000 mg | INHALATION_SOLUTION | Freq: Once | RESPIRATORY_TRACT | Status: AC
  Administered 2012-11-12: 2.5 mg via RESPIRATORY_TRACT

## 2012-11-12 MED ORDER — AMOXICILLIN-POT CLAVULANATE 875-125 MG PO TABS: 1.0000 | ORAL_TABLET | Freq: Two times a day (BID) | ORAL | Status: DC

## 2012-11-12 MED ORDER — ALBUTEROL SULFATE HFA 108 (90 BASE) MCG/ACT IN AERS: 2.0000 | INHALATION_SPRAY | Freq: Four times a day (QID) | RESPIRATORY_TRACT | Status: DC | PRN

## 2012-11-12 MED ORDER — GUAIFENESIN-CODEINE 100-10 MG/5ML PO SYRP: ORAL_SOLUTION | ORAL | Status: DC

## 2012-11-12 NOTE — Progress Notes (Signed)
  Subjective:     Kathleen [name] is a 57 y.o. female who presents for evaluation of sinus pain. Symptoms include: congestion, cough, facial pain, headaches, nasal congestion and sinus pressure. Onset of symptoms was 10 days ago. Symptoms have been gradually worsening since that time. Past history is significant for asthma. Patient is a non-smoker.  The following portions of the patient's history were reviewed and updated as appropriate: allergies, current medications, past family history, past medical history, past social history, past surgical history and problem list.  Review of Systems Pertinent items are noted in HPI.   Objective:    BP 122/76  Pulse 84  Temp(Src) 98.8 F (37.1 C) (Oral)  Wt 303 lb (137.44 kg)  BMI 41.09 kg/m2  SpO2 95% General appearance: alert, cooperative, appears stated age and no distress Ears: normal TM's and external ear canals both ears Nose: green discharge, moderate congestion, turbinates red, swollen, sinus tenderness bilateral Throat: abnormal findings: mild oropharyngeal erythema and pnd Neck: mild anterior cervical adenopathy, supple, symmetrical, trachea midline and thyroid not enlarged, symmetric, no tenderness/mass/nodules Lungs: wheezes bilaterally Heart: S1, S2 normal    Assessment:    Acute bacterial sinusitis Bronchitis.     Plan:    Nasal steroids per medication orders. Antihistamines per medication orders. Augmentin per medication orders.

## 2012-11-12 NOTE — Patient Instructions (Signed)

## 2012-11-14 ENCOUNTER — Telehealth: Payer: Self-pay | Admitting: *Deleted

## 2012-11-14 NOTE — Telephone Encounter (Signed)
Patient notified and patient understood.

## 2012-11-14 NOTE — Telephone Encounter (Signed)
Its only been 2 days--- abx usually takes longer than that to take effect

## 2012-11-14 NOTE — Telephone Encounter (Signed)
Patient was seen on 11/12/2012. Patient states that she has a bad cough, coughing up green mucous, having some wheeziong, which she states that the inhaler works some. Patient still is taking the antibiotics and cough syrup that was prescribed to her. Would like to know what else can be done? Wants to know if its normal to be sick this long? Patients states that she normally gets over this pretty quickly. Please advise. SW, CMA

## 2012-11-24 ENCOUNTER — Other Ambulatory Visit: Payer: Self-pay | Admitting: Cardiology

## 2012-11-28 ENCOUNTER — Other Ambulatory Visit: Payer: Self-pay

## 2012-12-18 ENCOUNTER — Other Ambulatory Visit: Payer: Self-pay | Admitting: Cardiology

## 2013-01-01 ENCOUNTER — Other Ambulatory Visit: Payer: Self-pay | Admitting: Family Medicine

## 2013-01-02 NOTE — Telephone Encounter (Signed)
Rx sent to the pharmacy by e-script.//AB/CMA 

## 2013-01-09 ENCOUNTER — Other Ambulatory Visit: Payer: Self-pay | Admitting: Family Medicine

## 2013-01-09 NOTE — Telephone Encounter (Signed)
Medication was for cellulitis of lower leg (10-11-12 appt) , pt advised to seek follow up treatment from Dermatology and have notes sent to [facility]. Med filled that day 90g with 1 refill

## 2013-02-04 ENCOUNTER — Other Ambulatory Visit: Payer: Self-pay | Admitting: Family Medicine

## 2013-02-04 NOTE — Telephone Encounter (Signed)
Med filled.  

## 2013-02-15 ENCOUNTER — Other Ambulatory Visit: Payer: Self-pay | Admitting: Cardiology

## 2013-02-25 ENCOUNTER — Other Ambulatory Visit: Payer: Self-pay | Admitting: Cardiology

## 2013-03-14 ENCOUNTER — Ambulatory Visit (INDEPENDENT_AMBULATORY_CARE_PROVIDER_SITE_OTHER): Payer: BC Managed Care – PPO | Admitting: Cardiology

## 2013-03-14 ENCOUNTER — Encounter: Payer: Self-pay | Admitting: Cardiology

## 2013-03-14 VITALS — BP 124/84 | HR 91 | Ht 72.0 in | Wt 308.0 lb

## 2013-03-14 DIAGNOSIS — I219 Acute myocardial infarction, unspecified: Secondary | ICD-10-CM

## 2013-03-14 DIAGNOSIS — I252 Old myocardial infarction: Secondary | ICD-10-CM

## 2013-03-14 DIAGNOSIS — I10 Essential (primary) hypertension: Secondary | ICD-10-CM

## 2013-03-14 DIAGNOSIS — I2510 Atherosclerotic heart disease of native coronary artery without angina pectoris: Secondary | ICD-10-CM

## 2013-03-14 NOTE — Patient Instructions (Signed)
The current medical regimen is effective;  continue present plan and medications.  Follow up in 1 year with Dr Hochrein.  You will receive a letter in the mail 2 months before you are due.  Please call us when you receive this letter to schedule your follow up appointment.  

## 2013-03-14 NOTE — Progress Notes (Signed)
HPI The patient presents for follow up of her CAD and HTN.   Since I last saw her she has had no complaints.  The patient denies any new symptoms such as chest discomfort, neck or arm discomfort. There has been no new shortness of breath, PND or orthopnea. There have been no reported palpitations, presyncope or syncope.  She is not particularly active although she helps to take care of her granddaughter who is a toddler now.    Allergies  Allergen Reactions  . Lamictal [Lamotrigine]     Rash   . Eggs Or Egg-Derived Products Hives    Can eat foods with cooked eggs; CANNOT handle when part of flu vaccine, etc.     Current Outpatient Prescriptions  Medication Sig Dispense Refill  . albuterol (PROVENTIL HFA;VENTOLIN HFA) 108 (90 BASE) MCG/ACT inhaler Inhale 2 puffs into the lungs every 6 (six) hours as needed for wheezing.  1 Inhaler  1  . ALPRAZolam (XANAX) 0.5 MG tablet Take 0.5 mg by mouth 2 (two) times daily as needed for anxiety.      [name] aspirin 81 MG tablet Take 81 mg by mouth at bedtime.       . clopidogrel (PLAVIX) 75 MG tablet Take 75 mg by mouth at bedtime.      . divalproex (DEPAKOTE ER) 500 MG 24 hr tablet Take 1,500 mg by mouth at bedtime.       [name] lisinopril (PRINIVIL,ZESTRIL) 10 MG tablet TAKE 1 TABLET (10 MG TOTAL) BY MOUTH DAILY.  30 tablet  1  . lurasidone (LATUDA) 40 MG TABS tablet Take 30 mg by mouth daily with breakfast.      . nitroGLYCERIN (NITROSTAT) 0.4 MG SL tablet Place 1 tablet (0.4 mg total) under the tongue every 5 (five) minutes as needed for chest pain. For chest pain  25 tablet  3  . OLANZapine (ZYPREXA) 20 MG tablet Take 20 mg by mouth at bedtime.      . simvastatin (ZOCOR) 20 MG tablet TAKE 1 TABLET (20 MG TOTAL) BY MOUTH AT BEDTIME.  90 tablet  0  . triamcinolone ointment (KENALOG) 0.1 % APPLY TOPICALLY 2 (TWO) TIMES DAILY.  80 g  1  . triamterene-hydrochlorothiazide (MAXZIDE-25) 37.5-25 MG per tablet TAKE 1 TABLET BY MOUTH EVERY DAY  30 tablet  0  .  VESICARE 5 MG tablet TAKE 1 TABLET BY MOUTH EVERY DAY  30 tablet  6   No current facility-administered medications for this visit.    Past Medical History  Diagnosis Date  . 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 Hypertension   . Depression   . Lower extremity deep venous thrombosis   . Dyslipidemia   . Psychiatric hospitalization 05/2008  . Asthma   . Coronary artery disease   . Anxiety   . Morbid obesity with BMI of 40.0-44.9, adult   . Hyperlipidemia     Past Surgical History  Procedure Laterality Date  . Breast mass excision      aGe 29, benign tumor  . Knee surgery      right, removed cartilage  . Tubal ligation    . Tonsillectomy    . Dilation and curettage of uterus      ROS:  As stated in the HPI and negative for all other systems.  PHYSICAL EXAM BP 124/84  Pulse 91  Ht 6' (1.829 m)  Wt 308 lb (139.708 kg)  BMI 41.76 kg/m2 GENERAL:  Well appearing  HEENT:  Pupils equal round and reactive, fundi not visualized, oral mucosa unremarkable NECK:  No jugular venous distention, waveform within normal limits, carotid upstroke brisk and symmetric, no bruits, no thyromegaly LUNGS:  Clear to auscultation bilaterally CHEST:  Unremarkable HEART:  PMI not displaced or sustained,S1 and S2 within normal limits, no S3, no S4, no clicks, no rubs, no murmurs ABD:  Flat, positive bowel sounds normal in frequency in pitch, no bruits, no rebound, no guarding, no midline pulsatile mass, no hepatomegaly, no splenomegaly EXT:  2 plus pulses throughout, no edema, no cyanosis no clubbing, chronic venous stasis changes.    EKG:  Sinus rhythm, rate 91, axis within normal limits, intervals within normal limits, no acute ST-T wave changes.  03/14/2013   ASSESSMENT AND PLAN  CAD (coronary artery disease) -  The patient has no new sypmtoms. No further cardiovascular testing is indicated. We will continue with aggressive risk reduction and meds as listed.      DYSLIPIDEMIA -  Her lipid profile was excellent with an LDL of 99 and HDL of 70. She will continue on the meds as listed.  HYPERTENSION -  The blood pressure is at target. No change in medications is indicated. We will continue with therapeutic lifestyle changes (TLC).

## 2013-03-25 ENCOUNTER — Other Ambulatory Visit: Payer: Self-pay | Admitting: Cardiology

## 2013-03-25 ENCOUNTER — Other Ambulatory Visit: Payer: Self-pay | Admitting: Family Medicine

## 2013-03-26 NOTE — Telephone Encounter (Signed)
Med filled.  

## 2013-04-07 ENCOUNTER — Other Ambulatory Visit: Payer: Self-pay | Admitting: *Deleted

## 2013-04-07 MED ORDER — TRIAMTERENE-HCTZ 37.5-25 MG PO TABS: ORAL_TABLET | ORAL | Status: DC

## 2013-05-07 ENCOUNTER — Encounter: Payer: Self-pay | Admitting: Nurse Practitioner

## 2013-05-07 ENCOUNTER — Ambulatory Visit (INDEPENDENT_AMBULATORY_CARE_PROVIDER_SITE_OTHER): Payer: BC Managed Care – PPO | Admitting: Nurse Practitioner

## 2013-05-07 VITALS — BP 139/84 | HR 96 | Temp 98.6°F | Ht 72.0 in | Wt 318.0 lb

## 2013-05-07 DIAGNOSIS — R300 Dysuria: Secondary | ICD-10-CM

## 2013-05-07 DIAGNOSIS — N390 Urinary tract infection, site not specified: Secondary | ICD-10-CM

## 2013-05-07 MED ORDER — NITROFURANTOIN MONOHYD MACRO 100 MG PO CAPS: 100.0000 mg | ORAL_CAPSULE | Freq: Two times a day (BID) | ORAL | Status: DC

## 2013-05-07 MED ORDER — PHENAZOPYRIDINE HCL 200 MG PO TABS: 200.0000 mg | ORAL_TABLET | Freq: Three times a day (TID) | ORAL | Status: DC | PRN

## 2013-05-07 NOTE — Patient Instructions (Signed)
Start antibiotic. Our office will call if we need to change the antibiotic. Take pyridium to relax bladder, caution: urine tears & sweat will be orange. Do not be alarmed! Sip hydrating fluids (water, juice, colorless soda, decaff tea) every hour to flush kidneys. Return in 1 month to recheck urine or sooner if symptoms do not improve or you feel worse.  Urinary Tract Infection Urinary tract infections (UTIs) can develop anywhere along your urinary tract. Your urinary tract is your body's drainage system for removing wastes and extra water. Your urinary tract includes two kidneys, two ureters, a bladder, and a urethra. Your kidneys are a pair of bean-shaped organs. Each kidney is about the size of your fist. They are located below your ribs, one on each side of your spine. CAUSES Infections are caused by microbes, which are microscopic organisms, including fungi, viruses, and bacteria. These organisms are so small that they can only be seen through a microscope. Bacteria are the microbes that most commonly cause UTIs. SYMPTOMS  Symptoms of UTIs may vary by age and gender of the patient and by the location of the infection. Symptoms in young women typically include a frequent and intense urge to urinate and a painful, burning feeling in the bladder or urethra during urination. Older women and men are more likely to be tired, shaky, and weak and have muscle aches and abdominal pain. A fever may mean the infection is in your kidneys. Other symptoms of a kidney infection include pain in your back or sides below the ribs, nausea, and vomiting. DIAGNOSIS To diagnose a UTI, your caregiver will ask you about your symptoms. Your caregiver also will ask to provide a urine sample. The urine sample will be tested for bacteria and white blood cells. White blood cells are made by your body to help fight infection. TREATMENT  Typically, UTIs can be treated with medication. Because most UTIs are caused by a bacterial  infection, they usually can be treated with the use of antibiotics. The choice of antibiotic and length of treatment depend on your symptoms and the type of bacteria causing your infection. HOME CARE INSTRUCTIONS  If you were prescribed antibiotics, take them exactly as your caregiver instructs you. Finish the medication even if you feel better after you have only taken some of the medication.  Drink enough water and fluids to keep your urine clear or pale yellow.  Avoid caffeine, tea, and carbonated beverages. They tend to irritate your bladder.  Empty your bladder often. Avoid holding urine for long periods of time.  Empty your bladder before and after sexual intercourse.  After a bowel movement, women should cleanse from front to back. Use each tissue only once. SEEK MEDICAL CARE IF:   You have back pain.  You develop a fever.  Your symptoms do not begin to resolve within 3 days. SEEK IMMEDIATE MEDICAL CARE IF:   You have severe back pain or lower abdominal pain.  You develop chills.  You have nausea or vomiting.  You have continued burning or discomfort with urination. MAKE SURE YOU:   Understand these instructions.  Will watch your condition.  Will get help right away if you are not doing well or get worse. Document Released: 10/19/2004 Document Revised: 07/11/2011 Document Reviewed: 02/17/2011 Exit[employer] Patient Information 2014 [employer].  

## 2013-05-07 NOTE — Progress Notes (Signed)
Pre visit review using our clinic review tool, if applicable. No additional management support is needed unless otherwise documented below in the visit note. 

## 2013-05-07 NOTE — Progress Notes (Signed)
   Subjective:    Patient ID: Kathleen [name], female    DOB: 10-Mar-1955, 58 y.o.   MRN: [id number]  Urinary Tract Infection  This is a new problem. The current episode started in the past 7 days. The problem occurs every urination. The problem has been gradually worsening. The quality of the pain is described as aching and burning. The pain is moderate. There has been no fever. Associated symptoms include frequency and urgency. Pertinent negatives include no chills, discharge, flank pain, hematuria, nausea or vomiting. She has tried nothing for the symptoms.      Review of Systems  Constitutional: Negative for fever, chills, activity change, appetite change and fatigue.  Gastrointestinal: Negative for nausea, vomiting, abdominal pain and diarrhea.  Genitourinary: Positive for dysuria, urgency, frequency, pelvic pain (pt states pain radiates up to shouler at end of micturition) and dyspareunia. Negative for hematuria and flank pain.  Musculoskeletal: Negative for back pain.  Psychiatric/Behavioral: Negative for confusion.       Objective:   Physical Exam  Vitals reviewed. Constitutional: She is oriented to person, place, and time. She appears well-developed and well-nourished. No distress.  HENT:  Head: Normocephalic and atraumatic.  Eyes: Conjunctivae are normal. Right eye exhibits no discharge. Left eye exhibits no discharge.  Cardiovascular: Normal rate.   Pulmonary/Chest: Effort normal. No respiratory distress.  Abdominal: Soft. She exhibits no distension and no mass. There is no tenderness. There is no rebound and no guarding.  Musculoskeletal: She exhibits no tenderness (no CVA tnderness).  Neurological: She is alert and oriented to person, place, and time.  Skin: Skin is warm and dry.  Psychiatric: She has a normal mood and affect. Her behavior is normal. Thought content normal.          Assessment & Plan:  1. Dysuria - Urine culture-pending - POCT urinalysis dipstick pos  leuks & blood -2. UTI (urinary tract infection)  nitrofurantoin, macrocrystal-monohydrate, (MACROBID) 100 MG capsule; Take 1 capsule (100 mg total) by mouth 2 (two) times daily.  Dispense: 14 capsule; Refill: 0 - phenazopyridine (PYRIDIUM) 200 MG tablet; Take 1 tablet (200 mg total) by mouth 3 (three) times daily as needed for pain.  Dispense: 6 tablet; Refill: 0 See pt instructions

## 2013-05-10 LAB — URINE CULTURE

## 2013-05-11 ENCOUNTER — Telehealth: Payer: Self-pay | Admitting: Nurse Practitioner

## 2013-05-11 DIAGNOSIS — N390 Urinary tract infection, site not specified: Secondary | ICD-10-CM

## 2013-05-11 MED ORDER — CIPROFLOXACIN HCL 500 MG PO TABS: 500.0000 mg | ORAL_TABLET | Freq: Two times a day (BID) | ORAL | Status: DC

## 2013-05-11 NOTE — Telephone Encounter (Signed)
Urine culture growing klebsiella. C&S: not sensitive to nitrofurantoin. Will change ABX to cipro. Will notify patient. Lm on home phone.

## 2013-05-12 ENCOUNTER — Encounter: Payer: Self-pay | Admitting: Physician Assistant

## 2013-05-12 ENCOUNTER — Ambulatory Visit (INDEPENDENT_AMBULATORY_CARE_PROVIDER_SITE_OTHER): Payer: BC Managed Care – PPO | Admitting: Physician Assistant

## 2013-05-12 ENCOUNTER — Telehealth: Payer: Self-pay | Admitting: Nurse Practitioner

## 2013-05-12 VITALS — BP 137/84 | HR 92 | Temp 98.1°F | Resp 16 | Ht 72.0 in | Wt 317.2 lb

## 2013-05-12 DIAGNOSIS — N390 Urinary tract infection, site not specified: Secondary | ICD-10-CM

## 2013-05-12 DIAGNOSIS — N12 Tubulo-interstitial nephritis, not specified as acute or chronic: Secondary | ICD-10-CM | POA: Insufficient documentation

## 2013-05-12 MED ORDER — SULFAMETHOXAZOLE-TMP DS 800-160 MG PO TABS: 1.0000 | ORAL_TABLET | Freq: Two times a day (BID) | ORAL | Status: DC

## 2013-05-12 MED ORDER — PHENAZOPYRIDINE HCL 200 MG PO TABS: 200.0000 mg | ORAL_TABLET | Freq: Three times a day (TID) | ORAL | Status: DC | PRN

## 2013-05-12 NOTE — Assessment & Plan Note (Signed)
Based on sensitivities, will treat with Bactrim BID x 7 days due to CVA tenderness.  Increase fluids.  Cranberry supplement and probiotic.  Discussed alarm signs/symptoms with patient and when to proceed to ER if necessary.  Call if symptoms nor improving within 24-48 hours.  ER if symptoms worsen.

## 2013-05-12 NOTE — Progress Notes (Signed)
Pre visit review using our clinic review tool, if applicable. No additional management support is needed unless otherwise documented below in the visit note/SLS  
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
## 2013-05-12 NOTE — Patient Instructions (Signed)
Please take the Bactrim as prescribed.  Do not pick up the Ciprofloxacin from the pharmacy.  The Bactrim should be a better antibiotic for this type of UTI.  Increase fluids intake.  Take "numbing" medication as directed.  Take a cranberry supplement and probiotic. IF symptoms are not improving some within 24-48 hours or if they are worsening, call the clinic.

## 2013-05-13 ENCOUNTER — Telehealth: Payer: Self-pay

## 2013-05-13 ENCOUNTER — Ambulatory Visit (INDEPENDENT_AMBULATORY_CARE_PROVIDER_SITE_OTHER): Payer: BC Managed Care – PPO | Admitting: *Deleted

## 2013-05-13 ENCOUNTER — Telehealth: Payer: Self-pay | Admitting: Nurse Practitioner

## 2013-05-13 ENCOUNTER — Telehealth: Payer: Self-pay | Admitting: Physician Assistant

## 2013-05-13 DIAGNOSIS — N390 Urinary tract infection, site not specified: Secondary | ICD-10-CM

## 2013-05-13 DIAGNOSIS — N10 Acute tubulo-interstitial nephritis: Secondary | ICD-10-CM

## 2013-05-13 MED ORDER — CEFTRIAXONE SODIUM 1 G IJ SOLR: 1.0000 g | INTRAMUSCULAR | Status: DC

## 2013-05-13 MED ORDER — CEFTRIAXONE SODIUM 1 G IJ SOLR
1.0000 g | INTRAMUSCULAR | Status: DC
  Administered 2013-05-13: 1 g via INTRAMUSCULAR

## 2013-05-13 NOTE — Telephone Encounter (Signed)
Pt was directed to Urgent Care or ER.  Patient stated she was going to the Urgent Care on [address]  No further questions or concerns voiced.

## 2013-05-13 NOTE — Telephone Encounter (Signed)
Opened in error

## 2013-05-13 NOTE — Telephone Encounter (Signed)
Per [name] pt needs to be seen at either urgent care or ER due to worsening pain and despite abx switch. Please call and notify.

## 2013-05-13 NOTE — Telephone Encounter (Signed)
Patient left a message stating that she would like to know her lab resutls? Looks like urine results?  Please advise?

## 2013-05-13 NOTE — Telephone Encounter (Signed)
Pt did not get message left on home phone on Sunday 4/19 to start new antibiotic. She has progressed to flank pain. No chills, fever, or nausea.  Pt had appt. Yesterday. Started bactrim. Recmd treatment for pyelo: rocephin IM q24h X 4 days, starting today. Will see in ofc tomorrow at 2: 30.  Spoke w/pt. Answered all questions. Pt has appt for nurse visit at [place] at 2:45 today for rocephin #1.

## 2013-05-13 NOTE — Telephone Encounter (Signed)
Please disregard Lanye (disrgard first note)  Dr [name] patient saw Einar Pheasant (he is off today) yesterday for a UTI and states her kidneys are "killing" her when she stands, walks, etc... Patient stated that it said on her AVS to contact provider if symptoms worsen in the next 24 hours.  Please advise?

## 2013-05-14 ENCOUNTER — Ambulatory Visit: Payer: Self-pay | Admitting: Nurse Practitioner

## 2013-05-14 ENCOUNTER — Ambulatory Visit (INDEPENDENT_AMBULATORY_CARE_PROVIDER_SITE_OTHER): Payer: BC Managed Care – PPO | Admitting: Nurse Practitioner

## 2013-05-14 VITALS — BP 135/81 | HR 89 | Temp 99.0°F | Wt 317.0 lb

## 2013-05-14 DIAGNOSIS — N10 Acute tubulo-interstitial nephritis: Secondary | ICD-10-CM

## 2013-05-14 NOTE — Progress Notes (Signed)
Pre-visit discussion using our clinic review tool. No additional management support is needed unless otherwise documented below in the visit note.  

## 2013-05-14 NOTE — Patient Instructions (Signed)
Continue to sip fluids every hour to flush kidneys. Please come back tomorrow for rocephin #3. I'd like for you to be seen on Friday when you come in for injection #4.   Pyelonephritis, Adult Pyelonephritis is a kidney infection. In general, there are 2 main types of pyelonephritis:  Infections that come on quickly without any warning (acute pyelonephritis).  Infections that persist for a long period of time (chronic pyelonephritis). CAUSES  Two main causes of pyelonephritis are:  Bacteria traveling from the bladder to the kidney. This is a problem especially in pregnant women. The urine in the bladder can become filled with bacteria from multiple causes, including:  Inflammation of the prostate gland (prostatitis).  Sexual intercourse in females.  Bladder infection (cystitis).  Bacteria traveling from the bloodstream to the tissue part of the kidney. Problems that may increase your risk of getting a kidney infection include:  Diabetes.  Kidney stones or bladder stones.  Cancer.  Catheters placed in the bladder.  Other abnormalities of the kidney or ureter. SYMPTOMS   Abdominal pain.  Pain in the side or flank area.  Fever.  Chills.  Upset stomach.  Blood in the urine (dark urine).  Frequent urination.  Strong or persistent urge to urinate.  Burning or stinging when urinating. DIAGNOSIS  Your caregiver may diagnose your kidney infection based on your symptoms. A urine sample may also be taken. TREATMENT  In general, treatment depends on how severe the infection is.   If the infection is mild and caught early, your caregiver may treat you with oral antibiotics and send you home.  If the infection is more severe, the bacteria may have gotten into the bloodstream. This will require intravenous (IV) antibiotics and a hospital stay. Symptoms may include:  High fever.  Severe flank pain.  Shaking chills.  Even after a hospital stay, your caregiver may  require you to be on oral antibiotics for a period of time.  Other treatments may be required depending upon the cause of the infection. HOME CARE INSTRUCTIONS   Take your antibiotics as directed. Finish them even if you start to feel better.  Make an appointment to have your urine checked to make sure the infection is gone.  Drink enough fluids to keep your urine clear or pale yellow.  Take medicines for the bladder if you have urgency and frequency of urination as directed by your caregiver. SEEK IMMEDIATE MEDICAL CARE IF:   You have a fever or persistent symptoms for more than 2-3 days.  You have a fever and your symptoms suddenly get worse.  You are unable to take your antibiotics or fluids.  You develop shaking chills.  You experience extreme weakness or fainting.  There is no improvement after 2 days of treatment. MAKE SURE YOU:  Understand these instructions.  Will watch your condition.  Will get help right away if you are not doing well or get worse. Document Released: 01/09/2005 Document Revised: 07/11/2011 Document Reviewed: 06/15/2010 [facility] Patient Information 2014 [place], [place].

## 2013-05-14 NOTE — Progress Notes (Signed)
Subjective:    Kathleen [name] is a 58 y.o. female who presents for follow up pyelonephritis. SHe was started on macrobid 1 week ago. After 4 days of macrobid she developed severe flank pain, no nausea, fever or chills. She was seen in ofc 3 da: macrobid was d/c'd & bactrim started. No improvement in bactrim within 24 hours. She was started on IM rocephin yesterday. Urine culture shows sensitivity to bactrim and rocephin. Today she reports marked improvement in flank pain. Has mild tenderness L flank, low grade fever in office. Plan is to continue with 3 more injections rocephin q24h.   The following portions of the patient's history were reviewed and updated as appropriate: allergies, current medications, past medical history, past social history, past surgical history and problem list.  Review of Systems Pertinent items are noted in HPI.    Objective:    BP 135/81  Pulse 89  Temp(Src) 99 F (37.2 C) (Oral)  Wt 317 lb (143.79 kg)  SpO2 95% General appearance: alert, cooperative, appears stated age and no distress Head: Normocephalic, without obvious abnormality, atraumatic Eyes: negative findings: lids and lashes normal and conjunctiva mildly injected, same as 1 week ago when I saw her in ofc. Back: bilat CVA tenderness Abdomen: suprapubic and L sided abd tenderness to palpation.    Assessment:    pyelonephritis, improvement of symptoms within 24h of rocephin #1 . Low grade fever, flank tenderness. No nausea, chills, fever. Plan:   Rocephin 1 gm IM today in ofc, then 2 additional doses q 24h. OV in 2 days.

## 2013-05-15 ENCOUNTER — Other Ambulatory Visit: Payer: Self-pay | Admitting: *Deleted

## 2013-05-15 ENCOUNTER — Ambulatory Visit: Payer: BC Managed Care – PPO | Admitting: *Deleted

## 2013-05-15 DIAGNOSIS — N10 Acute tubulo-interstitial nephritis: Secondary | ICD-10-CM

## 2013-05-15 MED ORDER — CLOPIDOGREL BISULFATE 75 MG PO TABS: 75.0000 mg | ORAL_TABLET | Freq: Every day | ORAL | Status: DC

## 2013-05-15 MED ORDER — CEFTRIAXONE SODIUM 1 G IJ SOLR: 1.0000 g | INTRAMUSCULAR | Status: DC

## 2013-05-16 ENCOUNTER — Ambulatory Visit (INDEPENDENT_AMBULATORY_CARE_PROVIDER_SITE_OTHER): Payer: BC Managed Care – PPO | Admitting: Nurse Practitioner

## 2013-05-16 ENCOUNTER — Ambulatory Visit: Payer: BC Managed Care – PPO

## 2013-05-16 ENCOUNTER — Encounter: Payer: Self-pay | Admitting: Nurse Practitioner

## 2013-05-16 VITALS — BP 134/84 | HR 88 | Temp 97.9°F | Resp 18 | Ht 72.0 in | Wt 317.0 lb

## 2013-05-16 DIAGNOSIS — N12 Tubulo-interstitial nephritis, not specified as acute or chronic: Secondary | ICD-10-CM

## 2013-05-16 DIAGNOSIS — N10 Acute tubulo-interstitial nephritis: Secondary | ICD-10-CM

## 2013-05-16 LAB — POCT URINALYSIS DIPSTICK
Bilirubin, UA: NEGATIVE
GLUCOSE UA: NEGATIVE
Leukocytes, UA: NEGATIVE
NITRITE UA: NEGATIVE
PROTEIN UA: NEGATIVE
Spec Grav, UA: >=1.03
UROBILINOGEN UA: 0.2
pH, UA: 5

## 2013-05-16 MED ORDER — CEFTRIAXONE SODIUM 1 G IJ SOLR
1.0000 g | Freq: Once | INTRAMUSCULAR | Status: AC
  Administered 2013-05-16: 1 g via INTRAMUSCULAR

## 2013-05-16 NOTE — Patient Instructions (Signed)
Please return in 1 month to make sure blood clears from urine.   Flank pain should be resolving in the next few days. If you still have pain, please call us. Take 500 mg Vitamin C twice daily to acidify urine for about 1 week. Continue to sip fluids throughout the day. Your urine is concentrated today, so you are not getting enough fluids.

## 2013-05-16 NOTE — Progress Notes (Signed)
Pre visit review using our clinic review tool, if applicable. No additional management support is needed unless otherwise documented below in the visit note. 

## 2013-05-18 NOTE — Assessment & Plan Note (Signed)
Last seen 48 hours ago.  Continues to improve: Afebrile today, mild dull pain in L flank-pt reports improved.   Rocephin IM #4 today in ofc. Double hydrating fluids as urine concentrated. Still has hematuria. Return in 3-4 weeks to re-check urine.

## 2013-05-18 NOTE — Progress Notes (Signed)
  Subjective:    Kathleen [name] is a 59 y.o. female who presents for follow up of pyelonephritis. She was seen 48 hrs ago, had low grade fever & bilateral flank tenderness. She has continued with rocephin IM q24h. She complains of mild L flank tenderness, but much improved.   The following portions of the patient's history were reviewed and updated as appropriate: allergies, current medications, past medical history, past social history, past surgical history and problem list.  Review of Systems Pertinent items are noted in HPI.    Objective:    BP 134/84  Pulse 88  Temp(Src) 97.9 F (36.6 C) (Oral)  Resp 18  Ht 6' (1.829 m)  Wt 317 lb (143.79 kg)  BMI 42.98 kg/m2  SpO2 94% General appearance: alert, cooperative, appears stated age and no distress Head: Normocephalic, without obvious abnormality, atraumatic Eyes: conjunctivae/corneas clear. PERRL, EOM's intact. Fundi benign., conjunctiva less injected Back: CVA tenderness L, none on R.  Laboratory:  Urine dipstick: ketones, mod blood, SG 1.030.   Micro exam: not done.    Assessment:   Pyelo, resolving.  Plan:   Rocephin #4 IM today Increase fluids. F/u 3-4 weeks to re-check urine.

## 2013-06-12 ENCOUNTER — Ambulatory Visit (INDEPENDENT_AMBULATORY_CARE_PROVIDER_SITE_OTHER): Payer: BC Managed Care – PPO | Admitting: Nurse Practitioner

## 2013-06-12 ENCOUNTER — Encounter: Payer: Self-pay | Admitting: Nurse Practitioner

## 2013-06-12 VITALS — BP 108/71 | HR 96 | Temp 98.5°F | Ht 72.0 in | Wt 317.0 lb

## 2013-06-12 DIAGNOSIS — R5383 Other fatigue: Secondary | ICD-10-CM

## 2013-06-12 DIAGNOSIS — R600 Localized edema: Secondary | ICD-10-CM

## 2013-06-12 DIAGNOSIS — R300 Dysuria: Secondary | ICD-10-CM

## 2013-06-12 DIAGNOSIS — R5381 Other malaise: Secondary | ICD-10-CM

## 2013-06-12 DIAGNOSIS — R358 Other polyuria: Secondary | ICD-10-CM

## 2013-06-12 DIAGNOSIS — M25569 Pain in unspecified knee: Secondary | ICD-10-CM

## 2013-06-12 DIAGNOSIS — R351 Nocturia: Secondary | ICD-10-CM

## 2013-06-12 DIAGNOSIS — R319 Hematuria, unspecified: Secondary | ICD-10-CM

## 2013-06-12 DIAGNOSIS — R609 Edema, unspecified: Secondary | ICD-10-CM

## 2013-06-12 DIAGNOSIS — R3589 Other polyuria: Secondary | ICD-10-CM

## 2013-06-12 NOTE — Patient Instructions (Addendum)
Our office will call with results and any necessary follow up.  Read Eat to Live by Excell Seltzer and begin implementing incorporating principles into eating habits. Exercise goal is 30 minutes daily. 3, 10 minute walks are better than none. Consider pool laps to take weight off knees. Exercise has many benefits from lower incidence of heart disease, stroke, cancer, diabetes, dementia, better management of depression & anxiety, better bone health & sleep quality. Consider using aspercream 3 times daily on knees. Weight loss will help. Use compression stockings when walking or prolonged standing. Salt restriction to 2400 mg daily. Nice to see you!    Venous Stasis or Chronic Venous Insufficiency Chronic venous insufficiency, also called venous stasis, is a condition that affects the veins in the legs. The condition prevents blood from being pumped through these veins effectively. Blood may no longer be pumped effectively from the legs back to the heart. This condition can range from mild to severe. With proper treatment, you should be able to continue with an active life. CAUSES  Chronic venous insufficiency occurs when the vein walls become stretched, weakened, or damaged or when valves within the vein are damaged. Some common causes of this include:  High blood pressure inside the veins (venous hypertension).  Increased blood pressure in the leg veins from long periods of sitting or standing.  A blood clot that blocks blood flow in a vein (deep vein thrombosis).  Inflammation of a superficial vein (phlebitis) that causes a blood clot to form. RISK FACTORS Various things can make you more likely to develop chronic venous insufficiency, including:  Family history of this condition.  Obesity.  Pregnancy.  Sedentary lifestyle.  Smoking.  Jobs requiring long periods of standing or sitting in one place.  Being a certain age. Women in their 13s and 27s and men in their 5s are more likely  to develop this condition. SIGNS AND SYMPTOMS  Symptoms may include:   Varicose veins.  Skin breakdown or ulcers.  Reddened or discolored skin on the leg.  Brown, smooth, tight, and painful skin just above the ankle, usually on the inside surface (lipodermatosclerosis).  Swelling. DIAGNOSIS  To diagnose this condition, your health care provider will take a medical history and do a physical exam. The following tests may be ordered to confirm the diagnosis:  Duplex ultrasound A procedure that produces a picture of a blood vessel and nearby organs and also provides information on blood flow through the blood vessel.  Plethysmography A procedure that tests blood flow.  A venogram, or venography A procedure used to look at the veins using X-ray and dye. TREATMENT The goals of treatment are to help you return to an active life and to minimize pain or disability. Treatment will depend on the severity of the condition. Medical procedures may be needed for severe cases. Treatment options may include:   Use of compression stockings. These can help with symptoms and lower the chances of the problem getting worse, but they do not cure the problem.  Sclerotherapy A procedure involving an injection of a material that "dissolves" the damaged veins. Other veins in the network of blood vessels take over the function of the damaged veins.  Surgery to remove the vein or cut off blood flow through the vein (vein stripping or laser ablation surgery).  Surgery to repair a valve. HOME CARE INSTRUCTIONS   Wear compression stockings as directed by your health care provider.  Only take over-the-counter or prescription medicines for pain, discomfort, or  fever as directed by your health care provider.  Follow up with your health care provider as directed. SEEK MEDICAL CARE IF:   You have redness, swelling, or increasing pain in the affected area.  You see a red streak or line that extends up or down  from the affected area.  You have a breakdown or loss of skin in the affected area, even if the breakdown is small.  You have an injury to the affected area. SEEK IMMEDIATE MEDICAL CARE IF:   You have an injury and open wound in the affected area.  Your pain is severe and does not improve with medicine.  You have sudden numbness or weakness in the foot or ankle below the affected area, or you have trouble moving your foot or ankle.  You have a fever or persistent symptoms for more than 2 3 days.  You have a fever and your symptoms suddenly get worse. MAKE SURE YOU:   Understand these instructions.  Will watch your condition.  Will get help right away if you are not doing well or get worse. Document Released: 05/15/2006 Document Revised: 10/30/2012 Document Reviewed: 09/16/2012 [facility] Patient Information 2014 [place].  Exercise to Stay Healthy Exercise helps you become and stay healthy. EXERCISE IDEAS AND TIPS Choose exercises that:  You enjoy.  Fit into your day. You do not need to exercise really hard to be healthy. You can do exercises at a slow or medium level and stay healthy. You can:  Stretch before and after working out.  Try yoga, Pilates, or tai chi.  Lift weights.  Walk fast, swim, jog, run, climb stairs, bicycle, dance, or rollerskate.  Take aerobic classes. Exercises that burn about 150 calories:  Running 1  miles in 15 minutes.  Playing volleyball for 45 to 60 minutes.  Washing and waxing a car for 45 to 60 minutes.  Playing touch football for 45 minutes.  Walking 1  miles in 35 minutes.  Pushing a stroller 1  miles in 30 minutes.  Playing basketball for 30 minutes.  Raking leaves for 30 minutes.  Bicycling 5 miles in 30 minutes.  Walking 2 miles in 30 minutes.  Dancing for 30 minutes.  Shoveling snow for 15 minutes.  Swimming laps for 20 minutes.  Walking up stairs for 15 minutes.  Bicycling 4 miles in 15  minutes.  Gardening for 30 to 45 minutes.  Jumping rope for 15 minutes.  Washing windows or floors for 45 to 60 minutes. Document Released: 02/11/2010 Document Revised: 04/03/2011 Document Reviewed: 02/11/2010 [facility] Patient Information 2014 [place], [place].

## 2013-06-12 NOTE — Assessment & Plan Note (Signed)
Likely osteoarthritis. Knees cool, no effusion. Aspercream tid. Weight loss. Exercise in pool. Consider xrays, voltaren gel, steroid injections, or ref to ortho.

## 2013-06-12 NOTE — Progress Notes (Signed)
Subjective:     Kathleen [name] is a 58 y.o. female who presents for follow up of hematuria. She was treated for pyelonephritis 1 mo ago w/ IM rocephin. Flank pain has resolved but she continues to have dysuria. Additionally, she c/o polyuria, nocturia, increased SOB, on going LE edema, fatigue, and chronic knee pain that prevents exercise. Kathleen [name] saw her cardiologist 3 mos ago, no changes to meds, no additional testing, lifestyle change recommended.   Polyuria, nocturia, & fatigue have been present for about 1 year. She is concerned about her risk for diabetes: BMI & psych meds that can cause hyperglycemia. LE edema has been present for about 5 years. She has been to wound care center in past. She is not consistent w/wearing compression stockings. Swelling is present all day, but worse in evening. Knee pain is chronic, prevents exercise. Denies swelling, warmth, or erythema.  The following portions of the patient's history were reviewed and updated as appropriate: allergies, current medications, past family history, past medical history, past social history, past surgical history and problem list.  Review of Systems Pertinent items are noted in HPI.    Objective:    BP 108/71  Pulse 96  Temp(Src) 98.5 F (36.9 C) (Temporal)  Ht 6' (1.829 m)  Wt 317 lb (143.79 kg)  BMI 42.98 kg/m2  SpO2 97% BP 108/71  Pulse 96  Temp(Src) 98.5 F (36.9 C) (Temporal)  Ht 6' (1.829 m)  Wt 317 lb (143.79 kg)  BMI 42.98 kg/m2  SpO2 97% General appearance: alert, cooperative, appears stated age, no distress and . Head: Normocephalic, without obvious abnormality, atraumatic Eyes: bilat conjunctiva mildly injected, no change from last visit. Extremities: edema +2 pitting from feet to knees, bilat and venous stasis dermatitis noted Pulses: 2+ and symmetric Skin: purple/brown discoloration bilat LE: petechial pattern L & solid patch on R.    Assessment:     1. Hematuria   2. Dysuria   3. Nocturia    4. Polyuria   5. Other malaise and fatigue   6. Peripheral edema   7. KNEE PAIN    Plan:   See problem list for complete A&P

## 2013-06-12 NOTE — Assessment & Plan Note (Addendum)
Chronic. DD: SE of psych meds, poor diet, sedentary lifestyle, HF, thyroid disease, B12 deficiency, D deficiency, depression, anemia Labs: B12, vit D, CBC, thyroid studies. Encourage exercise in small increments: 3, 10 minute walks daily. Pool exercise. Goal 30 minutes daily. Diet changes: read Eat to Live by Excell Seltzer.

## 2013-06-12 NOTE — Assessment & Plan Note (Signed)
Frequent urination: every hour to every 30 minutes for about 1 year. Nocturia: 4 times/night. Also c/o polydipsia. Psych meds may cause hyperglycemia. Labs: A1C, CMET

## 2013-06-12 NOTE — Assessment & Plan Note (Addendum)
+  2 pitting edema bilat feet to knees. Hemosiderin staining bilat LE.  Chronic. Accompanied by SOB. Taking ACEI & diuretic.  Saw cardio 3 mos ago-no change. DD: venous insufficiency, heart failure, kidney disease, thyroid disease, anemia Not exercising due to knee pain. Encourage use of compression stockings. Salt restriction to 2400 mg/day. Discussed importance of avoiding prolonged sitting, frequent walks.  Labs: CMET, CBC, thyroid studies

## 2013-06-12 NOTE — Progress Notes (Signed)
Pre visit review using our clinic review tool, if applicable. No additional management support is needed unless otherwise documented below in the visit note. 

## 2013-06-13 ENCOUNTER — Telehealth: Payer: Self-pay | Admitting: Nurse Practitioner

## 2013-06-13 LAB — HEMOGLOBIN A1C: Hgb A1c MFr Bld: 5.8 % (ref 4.6–6.5)

## 2013-06-13 LAB — COMPREHENSIVE METABOLIC PANEL
ALBUMIN: 4 g/dL (ref 3.5–5.2)
ALT: 18 U/L (ref 0–35)
AST: 21 U/L (ref 0–37)
Alkaline Phosphatase: 98 U/L (ref 39–117)
BUN: 23 mg/dL (ref 6–23)
CALCIUM: 10.6 mg/dL — AB (ref 8.4–10.5)
CHLORIDE: 100 meq/L (ref 96–112)
CO2: 27 mEq/L (ref 19–32)
Creatinine, Ser: 1 mg/dL (ref 0.4–1.2)
GFR: 63.51 mL/min (ref >60.00)
GLUCOSE: 74 mg/dL (ref 70–99)
POTASSIUM: 4.1 meq/L (ref 3.5–5.1)
Sodium: 137 mEq/L (ref 135–145)
Total Bilirubin: 0.7 mg/dL (ref 0.2–1.2)
Total Protein: 6.9 g/dL (ref 6.0–8.3)

## 2013-06-13 LAB — CBC
HCT: 43.3 % (ref 36.0–46.0)
Hemoglobin: 14.3 g/dL (ref 12.0–15.0)
MCHC: 33 g/dL (ref 30.0–36.0)
MCV: 89.4 fl (ref 78.0–100.0)
PLATELETS: 151 10*3/uL (ref 150.0–400.0)
RBC: 4.85 Mil/uL (ref 3.87–5.11)
RDW: 14.9 % (ref 11.5–15.5)
WBC: 6 10*3/uL (ref 4.0–10.5)

## 2013-06-13 LAB — URINALYSIS, MICROSCOPIC ONLY

## 2013-06-13 LAB — VITAMIN D 25 HYDROXY (VIT D DEFICIENCY, FRACTURES): VIT D 25 HYDROXY: 19 ng/mL — AB (ref 30–89)

## 2013-06-13 LAB — TSH: TSH: 1.91 u[IU]/mL (ref 0.35–4.50)

## 2013-06-13 LAB — THYROID PEROXIDASE ANTIBODY: Thyroperoxidase Ab SerPl-aCnc: 68.4 IU/mL — ABNORMAL HIGH (ref <35.0)

## 2013-06-13 LAB — VITAMIN B12: VITAMIN B 12: 561 pg/mL (ref 211–911)

## 2013-06-13 LAB — T4, FREE: Free T4: 0.92 ng/dL (ref 0.60–1.60)

## 2013-06-13 NOTE — Telephone Encounter (Signed)
Patient notified of results.

## 2013-06-13 NOTE — Telephone Encounter (Signed)
pls call pt: Advise Vit D extremely low.  Please start D3 5000 iu daily. Recheck levels in 3 mos. Still waiting on other lab results.

## 2013-06-13 NOTE — Addendum Note (Signed)
Addended by: Julieta Bellini on: 06/13/2013 04:34 PM   Modules accepted: Orders

## 2013-06-15 LAB — URINE CULTURE
Colony Count: NO GROWTH
ORGANISM ID, BACTERIA: NO GROWTH

## 2013-06-17 ENCOUNTER — Telehealth: Payer: Self-pay | Admitting: Nurse Practitioner

## 2013-06-17 DIAGNOSIS — E079 Disorder of thyroid, unspecified: Secondary | ICD-10-CM

## 2013-06-17 DIAGNOSIS — R7303 Prediabetes: Secondary | ICD-10-CM | POA: Insufficient documentation

## 2013-06-17 NOTE — Telephone Encounter (Signed)
A1C 5.8 from 5.1. Needs to cut out refined sugar & increase fiber from whole foods. Elevated TPO ab, nml TSH & free T4. Hashimoto euthyroid. Continue to monitor. Calcium chronically mildly elevated, low Vit D, will try to add PTH. No UTI. Renal epithileal cells-likely due to recent pyelo infection.

## 2013-06-17 NOTE — Assessment & Plan Note (Signed)
Elevated TPO antibodies, Nml TSH & Free T4. Hashimoto euthyroid. Continue to monitor.

## 2013-06-18 ENCOUNTER — Telehealth: Payer: Self-pay | Admitting: *Deleted

## 2013-06-18 NOTE — Telephone Encounter (Signed)
Spoke with pt, advised lab results. Pt understood. Unable to add PTH because it has to be a frozen specimen.

## 2013-06-18 NOTE — Telephone Encounter (Signed)
Not able to PTH: elevated TPO, NMl TSH & Free T4. A1C elevated, referred pt to pre-diabetes program [facility]. Adv to cut out srefined sugar, cut back refined flour, increase fiber from whole food to 50 grams daily. Renal epithelial cells in urine-likely post pyelo infection. Pt has CPE w/Dr [name] in July.

## 2013-06-18 NOTE — Telephone Encounter (Signed)
Patient left vm stating that she was returning your concerning her results.

## 2013-06-19 ENCOUNTER — Telehealth: Payer: Self-pay | Admitting: *Deleted

## 2013-06-19 NOTE — Telephone Encounter (Signed)
Patient called office to have her lab results read to her again. Pt requested copy be mailed to her also. Copy mailed to pt's home address.

## 2013-06-28 ENCOUNTER — Other Ambulatory Visit: Payer: Self-pay | Admitting: Family Medicine

## 2013-06-30 NOTE — Telephone Encounter (Signed)
Med filled.  

## 2013-07-23 ENCOUNTER — Other Ambulatory Visit: Payer: Self-pay | Admitting: *Deleted

## 2013-07-23 MED ORDER — LISINOPRIL 10 MG PO TABS: ORAL_TABLET | ORAL | Status: DC

## 2013-07-25 ENCOUNTER — Other Ambulatory Visit: Payer: Self-pay | Admitting: Family Medicine

## 2013-07-25 DIAGNOSIS — E785 Hyperlipidemia, unspecified: Secondary | ICD-10-CM

## 2013-07-28 NOTE — Telephone Encounter (Signed)
Refill for zocor sent to [facility] on [address]

## 2013-08-06 ENCOUNTER — Other Ambulatory Visit: Payer: Self-pay

## 2013-08-06 DIAGNOSIS — Z1231 Encounter for screening mammogram for malignant neoplasm of breast: Secondary | ICD-10-CM

## 2013-08-06 LAB — HM PAP SMEAR

## 2013-08-18 ENCOUNTER — Telehealth: Payer: Self-pay

## 2013-08-18 NOTE — Telephone Encounter (Signed)
Medication and allergies:  Reviewed and updated  90 day supply/mail order: n/a Local pharmacy:  [id number] - [place], [place] - [address] AT [address]OSS [address]   Immunizations due:  UTD   A/P: Personal, family history and past surgical hx: Reviewed and updated PAP- 08/06/13- no results back at this time--per patient [facility]- 07/15/09- normal; repeat in 10 years (06/2019); Dr. Penelope Coop at [facility]- 07/08/12-benign findings--has one scheduled 08/27/13 Tdap- 03/29/10   To Discuss with Provider: Nothing at this time.

## 2013-08-19 ENCOUNTER — Encounter: Payer: Self-pay | Admitting: Family Medicine

## 2013-08-19 ENCOUNTER — Ambulatory Visit (INDEPENDENT_AMBULATORY_CARE_PROVIDER_SITE_OTHER): Payer: BC Managed Care – PPO | Admitting: Family Medicine

## 2013-08-19 VITALS — BP 122/84 | HR 85 | Temp 98.0°F | Resp 16 | Ht 72.0 in | Wt 322.4 lb

## 2013-08-19 DIAGNOSIS — Z0000 Encounter for general adult medical examination without abnormal findings: Secondary | ICD-10-CM

## 2013-08-19 DIAGNOSIS — I872 Venous insufficiency (chronic) (peripheral): Secondary | ICD-10-CM

## 2013-08-19 DIAGNOSIS — I8310 Varicose veins of unspecified lower extremity with inflammation: Secondary | ICD-10-CM

## 2013-08-19 DIAGNOSIS — N3941 Urge incontinence: Secondary | ICD-10-CM

## 2013-08-19 LAB — BASIC METABOLIC PANEL
BUN: 17 mg/dL (ref 6–23)
CALCIUM: 10.6 mg/dL — AB (ref 8.4–10.5)
CO2: 27 mEq/L (ref 19–32)
Chloride: 101 mEq/L (ref 96–112)
Creatinine, Ser: 1 mg/dL (ref 0.4–1.2)
GFR: 59.85 mL/min — AB (ref >60.00)
Glucose, Bld: 109 mg/dL — ABNORMAL HIGH (ref 70–99)
Potassium: 4.2 mEq/L (ref 3.5–5.1)
SODIUM: 137 meq/L (ref 135–145)

## 2013-08-19 LAB — CBC WITH DIFFERENTIAL/PLATELET
BASOS PCT: 0.4 % (ref 0.0–3.0)
Basophils Absolute: 0 10*3/uL (ref 0.0–0.1)
EOS ABS: 0.1 10*3/uL (ref 0.0–0.7)
Eosinophils Relative: 1.6 % (ref 0.0–5.0)
HEMATOCRIT: 41.8 % (ref 36.0–46.0)
HEMOGLOBIN: 14.2 g/dL (ref 12.0–15.0)
Lymphocytes Relative: 19.1 % (ref 12.0–46.0)
Lymphs Abs: 1 10*3/uL (ref 0.7–4.0)
MCHC: 34 g/dL (ref 30.0–36.0)
MCV: 87.9 fl (ref 78.0–100.0)
MONO ABS: 0.4 10*3/uL (ref 0.1–1.0)
Monocytes Relative: 7.9 % (ref 3.0–12.0)
NEUTROS ABS: 3.8 10*3/uL (ref 1.4–7.7)
Neutrophils Relative %: 71 % (ref 43.0–77.0)
Platelets: 140 10*3/uL — ABNORMAL LOW (ref 150.0–400.0)
RBC: 4.75 Mil/uL (ref 3.87–5.11)
RDW: 14.5 % (ref 11.5–15.5)
WBC: 5.4 10*3/uL (ref 4.0–10.5)

## 2013-08-19 LAB — HEPATIC FUNCTION PANEL
ALT: 30 U/L (ref 0–35)
AST: 27 U/L (ref 0–37)
Albumin: 4 g/dL (ref 3.5–5.2)
Alkaline Phosphatase: 92 U/L (ref 39–117)
BILIRUBIN DIRECT: 0.1 mg/dL (ref 0.0–0.3)
TOTAL PROTEIN: 7.2 g/dL (ref 6.0–8.3)
Total Bilirubin: 0.7 mg/dL (ref 0.2–1.2)

## 2013-08-19 LAB — LIPID PANEL
CHOL/HDL RATIO: 3
Cholesterol: 156 mg/dL (ref 0–200)
HDL: 50 mg/dL (ref >39.00)
LDL Cholesterol: 79 mg/dL (ref 0–99)
NONHDL: 106
Triglycerides: 134 mg/dL (ref 0.0–149.0)
VLDL: 26.8 mg/dL (ref 0.0–40.0)

## 2013-08-19 LAB — VITAMIN D 25 HYDROXY (VIT D DEFICIENCY, FRACTURES): VITD: 47.42 ng/mL (ref 30.00–100.00)

## 2013-08-19 LAB — TSH: TSH: 3.39 u[IU]/mL (ref 0.35–4.50)

## 2013-08-19 MED ORDER — MIRABEGRON ER 25 MG PO TB24: 25.0000 mg | ORAL_TABLET | Freq: Every day | ORAL | Status: DC

## 2013-08-19 NOTE — Assessment & Plan Note (Signed)
New.  No signs of infxn.  Pt has seen wound center.  Will continue to follow.

## 2013-08-19 NOTE — Progress Notes (Signed)
   Subjective:    Patient ID: Kathleen [name], female    DOB: Jul 24, 1955, 58 y.o.   MRN: [id number]  HPI  CPE- UTD on colonoscopy.  mammo scheduled for next week.  UTD on pap.     Review of Systems Patient reports no vision/ hearing changes, adenopathy,fever, weight change,  persistant/recurrent hoarseness , swallowing issues, chest pain, palpitations, edema, persistant/recurrent cough, hemoptysis, dyspnea (rest/exertional/paroxysmal nocturnal), gastrointestinal bleeding (melena, rectal bleeding), abdominal pain, significant heartburn, bowel changes, Gyn symptoms (abnormal  bleeding, pain),  syncope, focal weakness, memory loss, numbness & tingling, skin/hair/nail changes, abnormal bruising or bleeding, anxiety, or depression.  + urinary incontinence- pt was prescribed Vesicare at last visit.  Took for 8 months w/o improvement so d/c'd.   L knee pain- pt [place] the beach last [place] (fell last summer as well).  Since recent fall, knee is popping regularly.  Unable to go up or down stairs.  Painful to bear weight or rise to standing position.    Objective:   Physical Exam General Appearance:    Alert, cooperative, no distress, appears stated age, obese  Head:    Normocephalic, without obvious abnormality, atraumatic  Eyes:    PERRL, conjunctiva/corneas clear, EOM's intact, fundi    benign, both eyes  Ears:    Normal TM's and external ear canals, both ears  Nose:   Nares normal, septum midline, mucosa normal, no drainage    or sinus tenderness  Throat:   Lips, mucosa, and tongue normal; teeth and gums normal  Neck:   Supple, symmetrical, trachea midline, no adenopathy;    Thyroid: no enlargement/tenderness/nodules  Back:     Symmetric, no curvature, ROM normal, no CVA tenderness  Lungs:     Clear to auscultation bilaterally, respirations unlabored  Chest Wall:    No tenderness or deformity   Heart:    Regular rate and rhythm, S1 and S2 normal, no murmur, rub   or gallop  Breast  Exam:    Deferred to GYN  Abdomen:     Soft, non-tender, bowel sounds active all four quadrants,    no masses, no organomegaly  Genitalia:    Deferred to GYN  Rectal:    Extremities:   Extremities normal, atraumatic, no cyanosis or edema  Pulses:   2+ and symmetric all extremities  Skin:   Skin color, texture, turgor normal, + venous stasis dermatitis of LEs bilaterally  Lymph nodes:   Cervical, supraclavicular, and axillary nodes normal  Neurologic:   CNII-XII intact, normal strength, sensation and reflexes    throughout          Assessment & Plan:

## 2013-08-19 NOTE — Assessment & Plan Note (Signed)
Pt's PE WNL w/ exception of obesity and audible popping/visible instability of L knee.  UTD on health maintenance.  Check labs.  Anticipatory guidance provided.

## 2013-08-19 NOTE — Progress Notes (Signed)
Pre visit review using our clinic review tool, if applicable. No additional management support is needed unless otherwise documented below in the visit note. 

## 2013-08-19 NOTE — Assessment & Plan Note (Signed)
No improvement w/ Vesicare.  Will start Myrbetriq and refer to urology if no improvement.  Pt expressed understanding and is in agreement w/ plan.

## 2013-08-19 NOTE — Patient Instructions (Signed)
Follow up in 6 months to recheck BP and cholesterol Please call in the next 4-8 weeks and let me know if the Myrbetriq is working [employer] notify you of your lab results and make any changes if needed Call Ortho for the knee Call with any questions or concerns Enjoy the rest of your summer!

## 2013-08-27 ENCOUNTER — Ambulatory Visit (HOSPITAL_COMMUNITY)
Admission: RE | Admit: 2013-08-27 | Discharge: 2013-08-27 | Disposition: A | Discharge status: Home / Self Care | Payer: BC Managed Care – PPO | Source: Ambulatory Visit | Attending: Internal Medicine | Admitting: Internal Medicine

## 2013-08-27 ENCOUNTER — Ambulatory Visit: Payer: Self-pay

## 2013-08-27 VITALS — BP 124/76 | HR 93 | Wt 323.8 lb

## 2013-08-27 DIAGNOSIS — I2510 Atherosclerotic heart disease of native coronary artery without angina pectoris: Secondary | ICD-10-CM

## 2013-08-27 DIAGNOSIS — G4730 Sleep apnea, unspecified: Secondary | ICD-10-CM

## 2013-08-27 DIAGNOSIS — R0602 Shortness of breath: Secondary | ICD-10-CM

## 2013-08-27 DIAGNOSIS — R609 Edema, unspecified: Secondary | ICD-10-CM

## 2013-08-27 DIAGNOSIS — I2584 Coronary atherosclerosis due to calcified coronary lesion: Secondary | ICD-10-CM

## 2013-08-27 DIAGNOSIS — E669 Obesity, unspecified: Secondary | ICD-10-CM

## 2013-08-27 MED ORDER — FUROSEMIDE 20 MG PO TABS: 20.0000 mg | ORAL_TABLET | ORAL | Status: DC

## 2013-08-27 MED ORDER — POTASSIUM CHLORIDE CRYS ER 20 MEQ PO TBCR: 20.0000 meq | EXTENDED_RELEASE_TABLET | ORAL | Status: DC

## 2013-08-27 NOTE — Patient Instructions (Signed)
Lasix (Furosemide) 20 mg every Mon, Wed and Friday  Take Potassium (k-dur) 20 meq every Mon, Wed and Fri with Lasix (furosemide)  Labs in 2 weeks (bmet)  Your physician has requested that you have an echocardiogram. Echocardiography is a painless test that uses sound waves to create images of your heart. It provides your doctor with information about the size and shape of your heart and how well your heart's chambers and valves are working. This procedure takes approximately one hour. There are no restrictions for this procedure.  You have been referred to Dr Elsworth Soho in Pulmonary for a sleep evaluation  Your physician recommends that you schedule a follow-up appointment in: 1 month

## 2013-08-27 NOTE — Progress Notes (Signed)
Patient ID: Kathleen [name], female   DOB: [birth year], 58 y.o.   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 Percival Spanish. Presents for further evaluation of SOB and LE edema.   Had NSTEMI in 2/09 had cath at that time with occlusion of septal perforator. Otherwise normal arteries. Was admitted for CP in 11/13. CE normal. Underwent dobutamine stress echo which was normal.   Since that time has occasional chest twinges but nothing sustained. No relation to exertion. Not very active. For past few months has noticed progressive DOE and LE edema. Now SOB with daily activities. + LE edema. Over past few weeks has had PND about twice a week. Also with 2 pillow orthopnea. In past year has gained 30 pounds. Over past 3 months has gained 5 pounds. Has not been watching fluid or salt intake. Drinks a lot of diet sodas. BP well controlled. Never smoker. Snores heavily. + prominent daytime somnolence. No HAs. Takes HCTZ for past 15 years. Never been on lasix  Echo 11/13 EF 60-65% Mild LAE. RV normal. No PH. No comment on diastolic function.    Allergies  Allergen Reactions  . Lamictal [Lamotrigine]     Rash   . Eggs Or Egg-Derived Products Hives    Can eat foods with cooked eggs; CANNOT handle when part of flu vaccine, etc.     Current Outpatient Prescriptions  Medication Sig Dispense Refill  . albuterol (PROVENTIL HFA;VENTOLIN HFA) 108 (90 BASE) MCG/ACT inhaler Inhale 2 puffs into the lungs every 6 (six) hours as needed for wheezing.  1 Inhaler  1  . ALPRAZolam (XANAX) 0.5 MG tablet Take 0.5 mg by mouth 2 (two) times daily as needed for anxiety.      [name] aspirin 81 MG tablet Take 81 mg by mouth at bedtime.       . cholecalciferol (VITAMIN D) 400 UNITS TABS tablet Take 400 Units by mouth.      . clopidogrel (PLAVIX) 75 MG tablet Take 1 tablet (75 mg total) by mouth at bedtime.  30 tablet  5  .  Cranberry-Cholecalciferol 4200-500 MG-UNIT CAPS Take 1 capsule by mouth at bedtime.      . divalproex (DEPAKOTE ER) 500 MG 24 hr tablet Take 1,500 mg by mouth at bedtime.       [name] lisinopril (PRINIVIL,ZESTRIL) 10 MG tablet TAKE 1 TABLET (10 MG TOTAL) BY MOUTH DAILY.  30 tablet  5  . lurasidone (LATUDA) 40 MG TABS tablet Take 30 mg by mouth at bedtime.       . nitroGLYCERIN (NITROSTAT) 0.4 MG SL tablet Place 1 tablet (0.4 mg total) under the tongue every 5 (five) minutes as needed for chest pain. For chest pain  25 tablet  3  . OLANZapine (ZYPREXA) 15 MG tablet Take 15 mg by mouth at bedtime.      . simvastatin (ZOCOR) 20 MG tablet TAKE 1 TABLET (20 MG TOTAL) BY MOUTH AT BEDTIME.  90 tablet  0  . triamterene-hydrochlorothiazide (MAXZIDE-25) 37.5-25 MG per tablet TAKE 1 TABLET BY MOUTH EVERY DAY  30 tablet  5   No current facility-administered medications for this encounter.    Past Medical History  Diagnosis Date  . 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 Hypertension   . Depression   . Lower extremity deep venous thrombosis   .  Dyslipidemia   . Psychiatric hospitalization 05/2008  . Asthma   . Coronary artery disease   . Anxiety   . Morbid obesity with BMI of 40.0-44.9, adult   . Hyperlipidemia   . Knee pain, left     Past Surgical History  Procedure Laterality Date  . Breast mass excision      aGe 21, benign tumor  . Knee surgery      right, removed cartilage  . Tubal ligation    . Tonsillectomy    . Dilation and curettage of uterus      ROS:  As stated in the HPI and negative for all other systems.  PHYSICAL EXAM BP 124/76  Pulse 93  Wt 323 lb 12 oz (146.852 kg)  SpO2 97% GENERAL:  Obese woman with dyspnea on hall walk HEENT:  Normal NECK:  Supple. JVP hard to see. Appears 7-8 cm carotid upstroke brisk and symmetric, no bruits, no thyromegaly LUNGS:  Clear to auscultation bilaterally CHEST:  Unremarkable HEART:  PMI not displaced or  sustained,S1 and S2 within normal limits, no S3, no S4, no clicks, no rubs, no murmurs ABD:  Obese. Soft NT/ND , no rebound, no guarding,  EXT:  2 plus pulses throughout, 1+ edema, no cyanosis no clubbing, chronic venous stasis changes.    EKG:  Sinus rhythm, rate 89, axis within normal limits, intervals within normal limits, no acute ST-T wave changes.  08/27/2013  Hall walk: Sats 95% or greater throughout  ASSESSMENT AND PLAN 1. Dyspnea 2. Chronic diastolic HF 3. Morbid obesity 4. CAD 5. HTN 6. Snoring - Probable OSA  While I suspect she does have some mild diastolic HF, I think the majority of her DOE is related to her obesity and probable undiagnosed OSA. Will start lasix 20mg  M/W/F with 20 kcl. Check BMET in 2 weeks. Repeat echo. Based on previous cath, I doubt CAD is a major player here.  Long talk about need for significant weight loss. I have recommended [place] and increased activity. We will also arrange for sleep study.     Benay Spice 6:37 PM

## 2013-08-28 ENCOUNTER — Ambulatory Visit
Admission: RE | Admit: 2013-08-28 | Discharge: 2013-08-28 | Disposition: A | Discharge status: Home / Self Care | Payer: BC Managed Care – PPO | Source: Ambulatory Visit

## 2013-08-28 DIAGNOSIS — Z1231 Encounter for screening mammogram for malignant neoplasm of breast: Secondary | ICD-10-CM

## 2013-08-31 DIAGNOSIS — R0602 Shortness of breath: Secondary | ICD-10-CM | POA: Insufficient documentation

## 2013-09-04 ENCOUNTER — Ambulatory Visit (HOSPITAL_COMMUNITY)
Admission: RE | Admit: 2013-09-04 | Discharge: 2013-09-04 | Disposition: A | Discharge status: Home / Self Care | Payer: BC Managed Care – PPO | Source: Ambulatory Visit | Attending: Family Medicine | Admitting: Family Medicine

## 2013-09-04 DIAGNOSIS — R609 Edema, unspecified: Secondary | ICD-10-CM | POA: Diagnosis not present

## 2013-09-04 DIAGNOSIS — I379 Nonrheumatic pulmonary valve disorder, unspecified: Secondary | ICD-10-CM

## 2013-09-04 DIAGNOSIS — R0602 Shortness of breath: Secondary | ICD-10-CM | POA: Diagnosis not present

## 2013-09-04 NOTE — Progress Notes (Signed)
Echocardiogram 2D Echocardiogram has been performed.  Kathleen [name] M 09/04/2013, 10:30 AM

## 2013-09-08 ENCOUNTER — Encounter (HOSPITAL_COMMUNITY): Payer: Self-pay

## 2013-09-25 ENCOUNTER — Ambulatory Visit (INDEPENDENT_AMBULATORY_CARE_PROVIDER_SITE_OTHER): Payer: BC Managed Care – PPO | Admitting: *Deleted

## 2013-09-25 DIAGNOSIS — I10 Essential (primary) hypertension: Secondary | ICD-10-CM

## 2013-09-26 LAB — BASIC METABOLIC PANEL
BUN: 29 mg/dL — AB (ref 6–23)
CHLORIDE: 104 meq/L (ref 96–112)
CO2: 26 meq/L (ref 19–32)
Calcium: 10.7 mg/dL — ABNORMAL HIGH (ref 8.4–10.5)
Creatinine, Ser: 1 mg/dL (ref 0.4–1.2)
GFR: 61.95 mL/min (ref >60.00)
GLUCOSE: 123 mg/dL — AB (ref 70–99)
Potassium: 4.1 mEq/L (ref 3.5–5.1)
Sodium: 139 mEq/L (ref 135–145)

## 2013-10-01 ENCOUNTER — Other Ambulatory Visit: Payer: Self-pay

## 2013-10-01 MED ORDER — TRIAMTERENE-HCTZ 37.5-25 MG PO TABS: ORAL_TABLET | ORAL | Status: DC

## 2013-10-02 ENCOUNTER — Ambulatory Visit (HOSPITAL_COMMUNITY)
Admission: RE | Admit: 2013-10-02 | Discharge: 2013-10-02 | Disposition: A | Discharge status: Home / Self Care | Payer: BC Managed Care – PPO | Source: Ambulatory Visit | Attending: Internal Medicine | Admitting: Internal Medicine

## 2013-10-02 VITALS — BP 134/78 | HR 96 | Wt 320.8 lb

## 2013-10-02 DIAGNOSIS — G4730 Sleep apnea, unspecified: Secondary | ICD-10-CM | POA: Insufficient documentation

## 2013-10-02 DIAGNOSIS — Z6841 Body Mass Index (BMI) 40.0 and over, adult: Secondary | ICD-10-CM | POA: Diagnosis not present

## 2013-10-02 DIAGNOSIS — R0989 Other specified symptoms and signs involving the circulatory and respiratory systems: Secondary | ICD-10-CM | POA: Insufficient documentation

## 2013-10-02 DIAGNOSIS — I5032 Chronic diastolic (congestive) heart failure: Secondary | ICD-10-CM | POA: Insufficient documentation

## 2013-10-02 DIAGNOSIS — R0609 Other forms of dyspnea: Secondary | ICD-10-CM | POA: Insufficient documentation

## 2013-10-02 DIAGNOSIS — I10 Essential (primary) hypertension: Secondary | ICD-10-CM | POA: Diagnosis not present

## 2013-10-02 DIAGNOSIS — E669 Obesity, unspecified: Secondary | ICD-10-CM

## 2013-10-02 DIAGNOSIS — I509 Heart failure, unspecified: Secondary | ICD-10-CM | POA: Diagnosis not present

## 2013-10-02 DIAGNOSIS — I2510 Atherosclerotic heart disease of native coronary artery without angina pectoris: Secondary | ICD-10-CM | POA: Diagnosis not present

## 2013-10-02 NOTE — Progress Notes (Addendum)
Patient ID: Kathleen [name], female   DOB: 1955-09-16, 58 y.o.   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 Percival Spanish. Presents for further evaluation of SOB and LE edema.   Had NSTEMI in 2/09 had cath at that time with occlusion of septal perforator. Otherwise normal arteries. Was admitted for CP in 11/13. CE normal. Underwent dobutamine stress echo which was normal.   She returns for follow up.  Last visit she was started on lasix 20 mg M-W-F. Weight at a home 327 down to 318 pounds. On Monday she stopped lasix due to leg cramps. Mild dyspnea with exertion. Following [employer]. Tolerating all medications. She has started water aerobics 2 times a week. 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   Echo 11/13 EF 60-65% Mild LAE. RV normal. No PH. No comment on diastolic function.  ECHO  09/04/13 EF 55-60%  Grade I DD  09/25/13 K 4.1 Creatinine 1.0    Allergies  Allergen Reactions  . Lamictal [Lamotrigine]     Rash   . Eggs Or Egg-Derived Products Hives    Can eat foods with cooked eggs; CANNOT handle when part of flu vaccine, etc.     Current Outpatient Prescriptions  Medication Sig Dispense Refill  . albuterol (PROVENTIL HFA;VENTOLIN HFA) 108 (90 BASE) MCG/ACT inhaler Inhale 2 puffs into the lungs every 6 (six) hours as needed for wheezing.  1 Inhaler  1  . ALPRAZolam (XANAX) 0.5 MG tablet Take 0.5 mg by mouth 2 (two) times daily as needed for anxiety.      [name] aspirin 81 MG tablet Take 81 mg by mouth at bedtime.       . cholecalciferol (VITAMIN D) 400 UNITS TABS tablet Take 400 Units by mouth.      . clopidogrel (PLAVIX) 75 MG tablet Take 1 tablet (75 mg total) by mouth at bedtime.  30 tablet  5  . Cranberry-Cholecalciferol 4200-500 MG-UNIT CAPS Take 1 capsule by mouth at bedtime.      . divalproex (DEPAKOTE ER) 500 MG 24 hr tablet Take 1,500 mg by mouth at bedtime.       . furosemide  (LASIX) 20 MG tablet Take 1 tablet (20 mg total) by mouth 3 (three) times a week. Every Mon, Wed and Fri  30 tablet  3  . lisinopril (PRINIVIL,ZESTRIL) 10 MG tablet TAKE 1 TABLET (10 MG TOTAL) BY MOUTH DAILY.  30 tablet  5  . lurasidone (LATUDA) 40 MG TABS tablet Take 30 mg by mouth at bedtime.       . nitroGLYCERIN (NITROSTAT) 0.4 MG SL tablet Place 1 tablet (0.4 mg total) under the tongue every 5 (five) minutes as needed for chest pain. For chest pain  25 tablet  3  . OLANZapine (ZYPREXA) 15 MG tablet Take 15 mg by mouth at bedtime.      . potassium chloride SA (K-DUR,KLOR-CON) 20 MEQ tablet Take 1 tablet (20 mEq total) by mouth 3 (three) times a week. Every Mon, Wed and Fri  30 tablet  3  . simvastatin (ZOCOR) 20 MG tablet TAKE 1 TABLET (20 MG TOTAL) BY MOUTH AT BEDTIME.  90 tablet  0  . triamterene-hydrochlorothiazide (MAXZIDE-25) 37.5-25 MG per tablet TAKE 1 TABLET BY MOUTH EVERY DAY  30 tablet  5   No current facility-administered medications for this encounter.    Past  Medical History  Diagnosis Date  . 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 Hypertension   . Depression   . Lower extremity deep venous thrombosis   . Dyslipidemia   . Psychiatric hospitalization 05/2008  . Asthma   . Coronary artery disease   . Anxiety   . Morbid obesity with BMI of 40.0-44.9, adult   . Hyperlipidemia   . Knee pain, left     Past Surgical History  Procedure Laterality Date  . Breast mass excision      aGe 58, benign tumor  . Knee surgery      right, removed cartilage  . Tubal ligation    . Tonsillectomy    . Dilation and curettage of uterus      ROS:  As stated in the HPI and negative for all other systems.  PHYSICAL EXAM BP 134/78  Pulse 96  Wt 320 lb 12 oz (145.491 kg)  SpO2 97% GENERAL:  Obese woman with dyspnea on hall walk HEENT:  Normal NECK:  Supple. JVP hard to see but does not appear elevated.  Carotid upstroke brisk and symmetric, no bruits, no  thyromegaly LUNGS:  Clear to auscultation bilaterally CHEST:  Unremarkable HEART:  PMI not displaced or sustained,S1 and S2 within normal limits, no S3, no S4, no clicks, no rubs, no murmurs ABD:  Obese. Soft NT/ND , no rebound, no guarding,  EXT:  2 plus pulses throughout,  No edema. No cyanosis no clubbing, chronic venous stasis changes.    ASSESSMENT AND PLAN 1. Dyspnea 2. Chronic diastolic HF 3. Morbid obesity 4. CAD 5. HTN 6. Snoring - Probable OSA has appointment with Pulmonary for sleep study.    Discussed ECHO. EF normal with Grade I DD. Volume status stable. Weight down 3 pounds. Change lasix to as needed for3 pound weight gain. Follow up in 3 months with Dr [name]  Congratulated on weight loss and exercise.    CLEGG,[name],NP-C  2:01 PM

## 2013-10-02 NOTE — Patient Instructions (Signed)
Follow up in 3 months  Take lasix 20 mg as needed for pound  Do the following things EVERYDAY: 1) Weigh yourself in the morning before breakfast. Write it down and keep it in a log. 2) Take your medicines as prescribed 3) Eat low salt foods-Limit salt (sodium) to 2000 mg per day.  4) Stay as active as you can everyday 5) Limit all fluids for the day to less than 2 liters

## 2013-10-13 ENCOUNTER — Encounter: Payer: Self-pay | Admitting: Pulmonary Disease

## 2013-10-13 ENCOUNTER — Ambulatory Visit (INDEPENDENT_AMBULATORY_CARE_PROVIDER_SITE_OTHER): Payer: BC Managed Care – PPO | Admitting: Pulmonary Disease

## 2013-10-13 VITALS — BP 138/82 | HR 90 | Temp 98.0°F | Ht 72.0 in | Wt 324.0 lb

## 2013-10-13 DIAGNOSIS — G4730 Sleep apnea, unspecified: Secondary | ICD-10-CM

## 2013-10-13 NOTE — Patient Instructions (Signed)
You do have symptoms of obstructive sleep apnea  Home sleep study

## 2013-10-13 NOTE — Assessment & Plan Note (Addendum)
Given excessive daytime somnolence, narrow pharyngeal exam, witnessed apneas & loud snoring, obstructive sleep apnea is very likely & an overnight polysomnogram will be scheduled as a home study. The pathophysiology of obstructive sleep apnea , it's cardiovascular consequences & modes of treatment including CPAP were discused with the patient in detail & they evidenced understanding. Clinical probability appears to be high, surprisingly polysomnogram was nondiagnostic in 2012. She appears to have had adequate REM sleep, in supine sleep was present. We will proceed with a home study to clarify given her weight gain and her reluctance to go back to the lab.
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
## 2013-11-24 ENCOUNTER — Encounter: Payer: Self-pay | Admitting: Family Medicine

## 2013-11-24 ENCOUNTER — Ambulatory Visit (INDEPENDENT_AMBULATORY_CARE_PROVIDER_SITE_OTHER): Payer: BC Managed Care – PPO | Admitting: Family Medicine

## 2013-11-24 VITALS — BP 142/86 | HR 95 | Temp 97.9°F | Resp 16 | Wt 323.4 lb

## 2013-11-24 DIAGNOSIS — M5410 Radiculopathy, site unspecified: Secondary | ICD-10-CM

## 2013-11-24 MED ORDER — MELOXICAM 15 MG PO TABS: 15.0000 mg | ORAL_TABLET | Freq: Every day | ORAL | Status: DC

## 2013-11-24 MED ORDER — CYCLOBENZAPRINE HCL 10 MG PO TABS: 10.0000 mg | ORAL_TABLET | Freq: Three times a day (TID) | ORAL | Status: DC | PRN

## 2013-11-24 NOTE — Progress Notes (Signed)
   Subjective:    Patient ID: Kathleen [name], female    DOB: November 17, 1955, 58 y.o.   MRN: [id number]  HPI LBP- sxs started yesterday when she got out of bed.  No known injury.  Pt did babysit 2 yr old granddaughter on Friday which required bending, twisting, lifting.  L sided.  No radiation of pain into butt or legs.  No weakness or numbness noted.  + diarrhea but no incontinence.   Review of Systems For ROS see HPI     Objective:   Physical Exam  Constitutional: She is oriented to person, place, and time. She appears well-developed and well-nourished. No distress.  HENT:  Head: Normocephalic and atraumatic.  Cardiovascular: Intact distal pulses.   Musculoskeletal: She exhibits tenderness (over L lumbar paraspinal muscle w/ mild spasm). She exhibits no edema.  Pain w/ forward flexion, improved w/ extension  Neurological: She is alert and oriented to person, place, and time. She has normal reflexes.  + SLR on L, (-) on R  Skin: Skin is warm and dry.  Psychiatric: She has a normal mood and affect. Her behavior is normal. Thought content normal.  Vitals reviewed.         Assessment & Plan:

## 2013-11-24 NOTE — Progress Notes (Signed)
Pre visit review using our clinic review tool, if applicable. No additional management support is needed unless otherwise documented below in the visit note. 

## 2013-11-24 NOTE — Patient Instructions (Signed)
Follow up as needed Start the Mobic daily w/ food x5-7 days and then as needed HEAT! Use the flexeril (muscle relaxer) nightly until symptoms improve Try and do gentle stretching to avoid stiffness Call with any questions or concerns Hang in there!!!

## 2013-11-24 NOTE — Assessment & Plan Note (Signed)
New.  Suspect pt's pain is due to babysitting her 58 yr old on Friday w/ bending/lifting/twisting.  No red flags on hx or PE.  Start scheduled NSAIDs and muscle relaxer prn.  Reviewed supportive care and red flags that should prompt return.  Pt expressed understanding and is in agreement w/ plan.

## 2013-11-27 DIAGNOSIS — G4730 Sleep apnea, unspecified: Secondary | ICD-10-CM

## 2013-12-06 ENCOUNTER — Telehealth: Payer: Self-pay | Admitting: Pulmonary Disease

## 2013-12-06 NOTE — Telephone Encounter (Signed)
Home sleep study did not show sig OSA Snoring + Does not need therapy

## 2013-12-08 NOTE — Telephone Encounter (Signed)
lmomtcb x1 

## 2013-12-09 DIAGNOSIS — G4730 Sleep apnea, unspecified: Secondary | ICD-10-CM

## 2013-12-09 NOTE — Telephone Encounter (Signed)
Pt advised. Jennifer Castillo, CMA  

## 2013-12-09 NOTE — Telephone Encounter (Signed)
Lmtcb. 11/17

## 2013-12-10 ENCOUNTER — Encounter: Payer: Self-pay | Admitting: Pulmonary Disease

## 2013-12-15 ENCOUNTER — Telehealth (HOSPITAL_COMMUNITY): Payer: Self-pay | Admitting: Anesthesiology

## 2013-12-15 MED ORDER — CLOPIDOGREL BISULFATE 75 MG PO TABS: 75.0000 mg | ORAL_TABLET | Freq: Every day | ORAL | Status: DC

## 2013-12-15 NOTE — Telephone Encounter (Signed)
Need refill on Plavix.

## 2013-12-30 ENCOUNTER — Other Ambulatory Visit: Payer: Self-pay | Admitting: General Practice

## 2013-12-30 DIAGNOSIS — E785 Hyperlipidemia, unspecified: Secondary | ICD-10-CM

## 2013-12-30 MED ORDER — SIMVASTATIN 20 MG PO TABS: ORAL_TABLET | ORAL | Status: DC

## 2014-01-01 ENCOUNTER — Ambulatory Visit (HOSPITAL_COMMUNITY)
Admission: RE | Admit: 2014-01-01 | Discharge: 2014-01-01 | Disposition: A | Discharge status: Home / Self Care | Payer: BC Managed Care – PPO | Source: Ambulatory Visit | Attending: Internal Medicine | Admitting: Internal Medicine

## 2014-01-01 VITALS — BP 126/74 | Wt 319.8 lb

## 2014-01-01 DIAGNOSIS — Z7982 Long term (current) use of aspirin: Secondary | ICD-10-CM | POA: Diagnosis not present

## 2014-01-01 DIAGNOSIS — Z79899 Other long term (current) drug therapy: Secondary | ICD-10-CM | POA: Diagnosis not present

## 2014-01-01 DIAGNOSIS — I10 Essential (primary) hypertension: Secondary | ICD-10-CM | POA: Diagnosis not present

## 2014-01-01 DIAGNOSIS — R0602 Shortness of breath: Secondary | ICD-10-CM | POA: Insufficient documentation

## 2014-01-01 DIAGNOSIS — I5032 Chronic diastolic (congestive) heart failure: Secondary | ICD-10-CM | POA: Diagnosis present

## 2014-01-01 DIAGNOSIS — R0683 Snoring: Secondary | ICD-10-CM | POA: Insufficient documentation

## 2014-01-01 DIAGNOSIS — I252 Old myocardial infarction: Secondary | ICD-10-CM | POA: Diagnosis not present

## 2014-01-01 DIAGNOSIS — I2510 Atherosclerotic heart disease of native coronary artery without angina pectoris: Secondary | ICD-10-CM | POA: Diagnosis not present

## 2014-01-01 DIAGNOSIS — E785 Hyperlipidemia, unspecified: Secondary | ICD-10-CM | POA: Diagnosis not present

## 2014-01-01 DIAGNOSIS — R600 Localized edema: Secondary | ICD-10-CM | POA: Insufficient documentation

## 2014-01-01 LAB — BASIC METABOLIC PANEL
Anion gap: 17 — ABNORMAL HIGH (ref 5–15)
BUN: 23 mg/dL (ref 6–23)
CALCIUM: 11.3 mg/dL — AB (ref 8.4–10.5)
CO2: 22 meq/L (ref 19–32)
CREATININE: 0.84 mg/dL (ref 0.50–1.10)
Chloride: 100 mEq/L (ref 96–112)
GFR calc Af Amer: 87 mL/min — ABNORMAL LOW (ref >90)
GFR calc non Af Amer: 75 mL/min — ABNORMAL LOW (ref >90)
Glucose, Bld: 157 mg/dL — ABNORMAL HIGH (ref 70–99)
Potassium: 4.2 mEq/L (ref 3.7–5.3)
Sodium: 139 mEq/L (ref 137–147)

## 2014-01-01 LAB — PRO B NATRIURETIC PEPTIDE: PRO B NATRI PEPTIDE: 66.5 pg/mL (ref 0–125)

## 2014-01-01 MED ORDER — POTASSIUM CHLORIDE CRYS ER 20 MEQ PO TBCR: 20.0000 meq | EXTENDED_RELEASE_TABLET | ORAL | Status: DC | PRN

## 2014-01-01 MED ORDER — FUROSEMIDE 20 MG PO TABS: 20.0000 mg | ORAL_TABLET | ORAL | Status: DC | PRN

## 2014-01-01 NOTE — Progress Notes (Signed)
Patient ID: Kathleen [name], female   DOB: 19-Dec-1955, 58 y.o.   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  She presented initially for evaluation of SOB and LE edema.   Had NSTEMI in 2/09 had cath at that time with occlusion of septal perforator. Otherwise normal arteries. Was admitted for CP in 11/13. CE normal. Underwent dobutamine stress echo which was normal.   She had a sleep study in 11/15 with minimal OSA.    She is stable symptomatically.  She can walk about a block before getting mildly short of breath.  She does water aerobics regularly.  Weight is down 1 lb.  She takes Lasix as needed and uses it about once a week.  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    Echo 11/13 EF 60-65% Mild LAE. RV normal. No PH. No comment on diastolic function.  ECHO 09/04/13 EF 55-60%  Grade I DD  Labs:  09/25/13 K 4.1 Creatinine 1.0 , LDL 79  Allergies  Allergen Reactions  . Lamictal [Lamotrigine]     Rash   . Eggs Or Egg-Derived Products Hives    Can eat foods with cooked eggs; CANNOT handle when part of flu vaccine, etc.     Current Outpatient Prescriptions  Medication Sig Dispense Refill  . albuterol (PROVENTIL HFA;VENTOLIN HFA) 108 (90 BASE) MCG/ACT inhaler Inhale 2 puffs into the lungs every 6 (six) hours as needed for wheezing. 1 Inhaler 1  . ALPRAZolam (XANAX) 0.5 MG tablet Take 0.5 mg by mouth 2 (two) times daily as needed for anxiety.    [name] aspirin 81 MG tablet Take 81 mg by mouth at bedtime.     . cholecalciferol (VITAMIN D) 400 UNITS TABS tablet Take 400 Units by mouth.    . clopidogrel (PLAVIX) 75 MG tablet Take 1 tablet (75 mg total) by mouth at bedtime. 30 tablet 5  . Cranberry-Cholecalciferol 4200-500 MG-UNIT CAPS Take 1 capsule by mouth at bedtime.    . cyclobenzaprine (FLEXERIL) 10 MG tablet Take 1 tablet (10 mg total) by mouth 3 (three) times daily as needed for muscle spasms.  30 tablet 0  . divalproex (DEPAKOTE ER) 500 MG 24 hr tablet Take 1,500 mg by mouth at bedtime.     . furosemide (LASIX) 20 MG tablet Take 1 tablet (20 mg total) by mouth as needed. 30 tablet 3  . lisinopril (PRINIVIL,ZESTRIL) 10 MG tablet TAKE 1 TABLET (10 MG TOTAL) BY MOUTH DAILY. 30 tablet 5  . lurasidone (LATUDA) 40 MG TABS tablet Take 40 mg by mouth at bedtime.     . meloxicam (MOBIC) 15 MG tablet Take 1 tablet (15 mg total) by mouth daily. (Patient taking differently: Take 15 mg by mouth as needed. ) 30 tablet 1  . nitroGLYCERIN (NITROSTAT) 0.4 MG SL tablet Place 1 tablet (0.4 mg total) under the tongue every 5 (five) minutes as needed for chest pain. For chest pain 25 tablet 3  . OLANZapine (ZYPREXA) 15 MG tablet Take 15 mg by mouth at bedtime.    . potassium chloride SA (K-DUR,KLOR-CON) 20 MEQ tablet Take 1 tablet (20 mEq total) by mouth as needed. 30 tablet 3  . simvastatin (ZOCOR) 20 MG tablet TAKE 1 TABLET (20 MG TOTAL) BY MOUTH AT BEDTIME. 90 tablet 0  . triamterene-hydrochlorothiazide (MAXZIDE-25) 37.5-25 MG per tablet TAKE 1 TABLET BY MOUTH EVERY DAY  30 tablet 5   No current facility-administered medications for this encounter.    Past Medical History  Diagnosis Date  . 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 Hypertension   . Depression   . Lower extremity deep venous thrombosis   . Dyslipidemia   . Psychiatric hospitalization 05/2008  . Asthma   . Coronary artery disease   . Anxiety   . Morbid obesity with BMI of 40.0-44.9, adult   . Hyperlipidemia   . Knee pain, left     Past Surgical History  Procedure Laterality Date  . Breast mass excision      aGe 65, benign tumor  . Knee surgery      right, removed cartilage  . Tubal ligation    . Tonsillectomy    . Dilation and curettage of uterus      ROS:  As stated in the HPI and negative for all other systems.  PHYSICAL EXAM BP 126/74 mmHg  Wt 319 lb 12 oz (145.038 kg)  SpO2  97% GENERAL:  Obese, NAD.  HEENT:  Normal NECK:  Supple. JVP hard to see but does not appear elevated.  Carotid upstroke brisk and symmetric, no bruits, no thyromegaly LUNGS:  Clear to auscultation bilaterally CHEST:  Unremarkable HEART:  PMI not displaced or sustained,S1 and S2 within normal limits, no S3, no S4, no clicks, no rubs, no murmurs ABD:  Obese. Soft NT/ND , no rebound, no guarding,  EXT:  2 plus pulses throughout,  No edema. No cyanosis no clubbing, chronic venous stasis changes with 1+ ankle edema bilaterally.    ASSESSMENT AND PLAN 1. Dyspnea: Probably due to a combination of obesity/deconditioning and diastolic CHF.  2. Chronic diastolic HF: Normal EF with grade I diastolic dysfunction on recent echo.  She does not appear volume overloaded on exam.  Generally doing ok.  She can continue to use Lasix as needed.  I will check BMET and BNP today.  3. Morbid obesity: She needs to continue aggressive efforts at diet and exercise for weight loss.  4. CAD: She will continue ASA 81, Plavix, statin.  Good LDL in 9/15.  5. HTN: BP controlled.  6. Snoring: She did not have OSA on sleep study.   Loralie Champagne 01/01/2014

## 2014-01-01 NOTE — Patient Instructions (Signed)
Labs today  We will contact you in 6 months to schedule your next appointment.  

## 2014-01-19 ENCOUNTER — Other Ambulatory Visit: Payer: Self-pay | Admitting: Cardiology

## 2014-01-19 NOTE — Telephone Encounter (Signed)
Rx refill sent to patient pharmacy   

## 2014-02-17 ENCOUNTER — Telehealth (HOSPITAL_COMMUNITY): Payer: Self-pay | Admitting: Vascular Surgery

## 2014-02-17 MED ORDER — LISINOPRIL 10 MG PO TABS: 10.0000 mg | ORAL_TABLET | Freq: Every day | ORAL | Status: DC

## 2014-02-17 NOTE — Telephone Encounter (Signed)
Refill Lisinopril

## 2014-02-19 ENCOUNTER — Telehealth (HOSPITAL_COMMUNITY): Payer: Self-pay | Admitting: Cardiology

## 2014-02-19 NOTE — Telephone Encounter (Signed)
Received message from answering service, pt was having difficulty breathing   Pt reports Unable to breathe x 2 hours, mild chest tightness Mild LE edema and nitroglycerin last night Pt reports she feels a lot better, denies SOB or chest pains weight stable @ 312

## 2014-02-20 ENCOUNTER — Telehealth (HOSPITAL_COMMUNITY): Payer: Self-pay | Admitting: *Deleted

## 2014-02-20 NOTE — Telephone Encounter (Signed)
Pt called to report she gained 3 lbs overnight, she states yesterday she did eat more salt so she did take a lasix last night but wt was up 3 lbs this am from yesterday am, she denies sob or edema and states she feels ok, advised to check wt in AM if not down to take lasix, she is aware and agreeable

## 2014-02-23 LAB — HM MAMMOGRAPHY: HM MAMMO: NORMAL (ref 0–4)

## 2014-03-02 ENCOUNTER — Telehealth (HOSPITAL_COMMUNITY): Payer: Self-pay | Admitting: Vascular Surgery

## 2014-03-02 NOTE — Telephone Encounter (Signed)
Spoke w/pt she states her wt is up 5 lb today, she does state she ate salty foods yesterday for the super bowl, she has not taken Lasix since Sat, advised her to take it now and call us back tomorrow if wt not down, she is agreeable

## 2014-03-02 NOTE — Telephone Encounter (Signed)
Pt gained 5lbs over night

## 2014-03-20 ENCOUNTER — Encounter: Payer: Self-pay | Admitting: Family Medicine

## 2014-03-20 ENCOUNTER — Ambulatory Visit (INDEPENDENT_AMBULATORY_CARE_PROVIDER_SITE_OTHER): Payer: BC Managed Care – PPO | Admitting: Family Medicine

## 2014-03-20 ENCOUNTER — Telehealth: Payer: Self-pay | Admitting: Family Medicine

## 2014-03-20 VITALS — BP 124/74 | HR 89 | Wt 321.8 lb

## 2014-03-20 DIAGNOSIS — R0602 Shortness of breath: Secondary | ICD-10-CM

## 2014-03-20 DIAGNOSIS — R002 Palpitations: Secondary | ICD-10-CM

## 2014-03-20 DIAGNOSIS — F411 Generalized anxiety disorder: Secondary | ICD-10-CM

## 2014-03-20 LAB — TROPONIN I: Troponin I: <0.01 ng/mL (ref <0.06)

## 2014-03-20 NOTE — Telephone Encounter (Signed)
Patient Name: Kathleen [name]  DOB: 15-May-1955    Initial Comment Caller states patient had panic attack last night and is having heart palpitations    Nurse Assessment  Nurse: Verlin Fester, RN, Stanton Kidney Date/Time Eilene Ghazi Time): 03/20/2014 8:19:05 AM  Confirm and document reason for call. If symptomatic, describe symptoms. ---Patient she had a panic attack last night and heart palpitations and they are all gone now  Has the patient traveled out of the country within the last 30 days? ---No  Does the patient require triage? ---Yes  Related visit to physician within the last 2 weeks? ---No  Does the PT have any chronic conditions? (i.e. diabetes, asthma, etc.) ---Yes  List chronic conditions. ---"depression, HTN, hx heart attack.     Guidelines    Guideline Title Affirmed Question Affirmed Notes  Anxiety and Panic Attack [1] Symptoms of anxiety or panic AND [2] has not been evaluated for this by physician    Final Disposition User   See PCP When Office is Open (within 3 days) Verlin Fester, RN, Stanton Kidney    Comments  After triage patient asks to be seen today

## 2014-03-20 NOTE — Assessment & Plan Note (Signed)
Palpitations , diaphoresis, and sob same symptoms as when she had her MI so pt became very anxious and her panic attacks are very much like these symptoms.   Pt is in no distress in in exam room.

## 2014-03-20 NOTE — Patient Instructions (Signed)

## 2014-03-20 NOTE — Progress Notes (Signed)
Subjective:    Patient ID: Kathleen [name], female    DOB: [birth year], 59 y.o.   MRN: [id number]  Palpitations  Associated symptoms include anxiety. Pertinent negatives include no chest pain, coughing or shortness of breath.    Patient here c/o sob and palpitations last night.  She thought it was a panic attack and took her xanax but it lasted 2 1/2 hours.   No chest pain.  + diaphoresis Pt states " I had the same symptoms when I had my heart attack and no chest pain"  Past Medical History  Diagnosis Date  . 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 Hypertension   . Depression   . Lower extremity deep venous thrombosis   . Dyslipidemia   . Psychiatric hospitalization 05/2008  . Asthma   . Coronary artery disease   . Anxiety   . Morbid obesity with BMI of 40.0-44.9, adult   . Hyperlipidemia   . Knee pain, left     Review of Systems  Constitutional: Positive for fatigue. Negative for activity change, appetite change and unexpected weight change.  Respiratory: Negative for cough and shortness of breath.   Cardiovascular: Positive for palpitations and leg swelling. Negative for chest pain.  Psychiatric/Behavioral: Negative for suicidal ideas, behavioral problems, dysphoric mood, decreased concentration and agitation. The patient is nervous/anxious.        Objective:    Physical Exam  Constitutional: She is oriented to person, place, and time. She appears well-developed and well-nourished. No distress.  HENT:  Right Ear: External ear normal.  Left Ear: External ear normal.  Nose: Nose normal.  Mouth/Throat: Oropharynx is clear and moist.  Eyes: EOM are normal. Pupils are equal, round, and reactive to light.  Neck: Normal range of motion. Neck supple.  Cardiovascular: Normal rate, regular rhythm and normal heart sounds.   No murmur heard. Pulmonary/Chest: Effort normal and breath sounds normal. No respiratory distress. She has no wheezes. She has no  rales. She exhibits no tenderness.  Neurological: She is alert and oriented to person, place, and time.  Psychiatric: Her behavior is normal. Judgment and thought content normal. Her mood appears anxious. Cognition and memory are normal. She expresses no homicidal and no suicidal ideation. She expresses no suicidal plans and no homicidal plans.  ? Panic attack last night    BP 124/74 mmHg  Pulse 89  Wt 321 lb 12.8 oz (145.968 kg)  SpO2 97% Wt Readings from Last 3 Encounters:  03/20/14 321 lb 12.8 oz (145.968 kg)  01/01/14 319 lb 12 oz (145.038 kg)  11/24/13 323 lb 6 oz (146.682 kg)     Lab Results  Component Value Date   WBC 5.4 08/19/2013   HGB 14.2 08/19/2013   HCT 41.8 08/19/2013   PLT 140.0* 08/19/2013   GLUCOSE 157* 01/01/2014   CHOL 156 08/19/2013   TRIG 134.0 08/19/2013   HDL 50.00 08/19/2013   LDLCALC 79 08/19/2013   ALT 30 08/19/2013   AST 27 08/19/2013   NA 139 01/01/2014   K 4.2 01/01/2014   CL 100 01/01/2014   CREATININE 0.84 01/01/2014   BUN 23 01/01/2014   CO2 22 01/01/2014   TSH 3.39 08/19/2013   INR 1.01 12/07/2011   HGBA1C 5.8 06/12/2013    No results found.    EKG--- NSR Assessment & Plan:   Problem List Items Addressed This Visit    Palpitations - Primary    Palpitations , diaphoresis, and sob  same symptoms as when she had her MI so pt became very anxious and her panic attacks are very much like these symptoms.   Pt is in no distress in in exam room.      Relevant Orders   EKG 12-Lead (Completed)   Troponin I   Hepatic function panel   CBC with Differential/Platelet   Basic Metabolic Panel (BMET)    Other Visit Diagnoses    SOB (shortness of breath)        Relevant Orders    Troponin I    Hepatic function panel    CBC with Differential/Platelet    Basic Metabolic Panel (BMET)    Generalized anxiety disorder        Relevant Orders    Troponin I    Hepatic function panel    CBC with Differential/Platelet    Basic Metabolic Panel  (BMET)     pt was instructed to go to ER if symptoms return---pt understands and also understands if her troponin is elevated we will instruct her to go to ER as well.   Garnet Koyanagi, DO

## 2014-03-20 NOTE — Progress Notes (Signed)
Pre visit review using our clinic review tool, if applicable. No additional management support is needed unless otherwise documented below in the visit note. 

## 2014-03-21 LAB — HEPATIC FUNCTION PANEL
ALBUMIN: 4.5 g/dL (ref 3.5–5.2)
ALT: 15 U/L (ref 0–35)
AST: 16 U/L (ref 0–37)
Alkaline Phosphatase: 100 U/L (ref 39–117)
BILIRUBIN DIRECT: 0.1 mg/dL (ref 0.0–0.3)
BILIRUBIN INDIRECT: 0.3 mg/dL (ref 0.2–1.2)
Total Bilirubin: 0.4 mg/dL (ref 0.2–1.2)
Total Protein: 7 g/dL (ref 6.0–8.3)

## 2014-03-21 LAB — CBC WITH DIFFERENTIAL/PLATELET
BASOS ABS: 0 10*3/uL (ref 0.0–0.1)
Basophils Relative: 0 % (ref 0–1)
EOS ABS: 0.1 10*3/uL (ref 0.0–0.7)
EOS PCT: 1 % (ref 0–5)
HCT: 45 % (ref 36.0–46.0)
HEMOGLOBIN: 15.2 g/dL — AB (ref 12.0–15.0)
LYMPHS ABS: 1.6 10*3/uL (ref 0.7–4.0)
LYMPHS PCT: 29 % (ref 12–46)
MCH: 30.1 pg (ref 26.0–34.0)
MCHC: 33.8 g/dL (ref 30.0–36.0)
MCV: 89.1 fL (ref 78.0–100.0)
MONO ABS: 0.5 10*3/uL (ref 0.1–1.0)
MONOS PCT: 9 % (ref 3–12)
MPV: 11 fL (ref 8.6–12.4)
Neutro Abs: 3.4 10*3/uL (ref 1.7–7.7)
Neutrophils Relative %: 61 % (ref 43–77)
Platelets: 158 10*3/uL (ref 150–400)
RBC: 5.05 MIL/uL (ref 3.87–5.11)
RDW: 14.2 % (ref 11.5–15.5)
WBC: 5.5 10*3/uL (ref 4.0–10.5)

## 2014-03-21 LAB — BASIC METABOLIC PANEL
BUN: 22 mg/dL (ref 6–23)
CHLORIDE: 102 meq/L (ref 96–112)
CO2: 26 meq/L (ref 19–32)
Calcium: 11 mg/dL — ABNORMAL HIGH (ref 8.4–10.5)
Creat: 0.97 mg/dL (ref 0.50–1.10)
Glucose, Bld: 97 mg/dL (ref 70–99)
POTASSIUM: 4.5 meq/L (ref 3.5–5.3)
Sodium: 140 mEq/L (ref 135–145)

## 2014-03-23 ENCOUNTER — Other Ambulatory Visit (INDEPENDENT_AMBULATORY_CARE_PROVIDER_SITE_OTHER): Payer: BC Managed Care – PPO

## 2014-03-24 ENCOUNTER — Telehealth: Payer: Self-pay | Admitting: Family Medicine

## 2014-03-24 LAB — PTH, INTACT AND CALCIUM
Calcium: 10.7 mg/dL — ABNORMAL HIGH (ref 8.4–10.5)
PTH: 141 pg/mL — AB (ref 14–64)

## 2014-03-24 NOTE — Telephone Encounter (Signed)
Should be done fasting but if it is normal I won't worry about it---if abnormal we will repeat it

## 2014-03-24 NOTE — Telephone Encounter (Signed)
PTH done but the patient was not fasting, please advise if she would need to repeat.    KP

## 2014-03-24 NOTE — Telephone Encounter (Signed)
Pt seen Dr. Etter Sjogren on 2/26 and had labs drawn for that encounter yesterday. Please advise?

## 2014-03-24 NOTE — Telephone Encounter (Signed)
Caller name: Kathleen [name] Relation to pt: self. Call back number: [phone] Pharmacy:  Reason for call:   Patient came in yesterday for labs but did not fast. She wants to know if she needs to repeat labs?

## 2014-03-24 NOTE — Telephone Encounter (Signed)
Patient has been made aware and verbalized understanding.     KP

## 2014-03-25 ENCOUNTER — Other Ambulatory Visit: Payer: Self-pay

## 2014-03-25 ENCOUNTER — Telehealth (HOSPITAL_COMMUNITY): Payer: Self-pay | Admitting: Vascular Surgery

## 2014-03-25 NOTE — Telephone Encounter (Signed)
Spoke with pt regarding SOB Pt reports weight was up 3lbs bp elevated, mild dizziness Pt has not missed any meds  Per Darrick Grinder, NP Pt should take double lasix x 2 days  Pt aware and resting SOB has stopped and pt feels a lot better

## 2014-03-25 NOTE — Telephone Encounter (Signed)
Pt called she is having increased SOB BP HI 162/97.[name] Please advise

## 2014-03-26 ENCOUNTER — Other Ambulatory Visit (INDEPENDENT_AMBULATORY_CARE_PROVIDER_SITE_OTHER): Payer: BC Managed Care – PPO

## 2014-03-27 LAB — PARATHYROID HORMONE, INTACT (NO CA): PTH: 147 pg/mL — ABNORMAL HIGH (ref 14–64)

## 2014-03-30 ENCOUNTER — Other Ambulatory Visit: Payer: Self-pay

## 2014-03-30 DIAGNOSIS — E349 Endocrine disorder, unspecified: Secondary | ICD-10-CM

## 2014-04-03 ENCOUNTER — Other Ambulatory Visit: Payer: Self-pay | Admitting: *Deleted

## 2014-04-03 MED ORDER — TRIAMTERENE-HCTZ 37.5-25 MG PO TABS: ORAL_TABLET | ORAL | Status: DC

## 2014-04-11 ENCOUNTER — Other Ambulatory Visit: Payer: Self-pay | Admitting: Family Medicine

## 2014-04-13 NOTE — Telephone Encounter (Signed)
Med filled.  

## 2014-04-15 ENCOUNTER — Encounter: Payer: Self-pay | Admitting: Internal Medicine

## 2014-04-15 ENCOUNTER — Ambulatory Visit (INDEPENDENT_AMBULATORY_CARE_PROVIDER_SITE_OTHER): Payer: BC Managed Care – PPO | Admitting: Internal Medicine

## 2014-04-15 VITALS — BP 122/80 | HR 82 | Temp 98.1°F | Resp 12 | Ht 72.0 in | Wt 320.0 lb

## 2014-04-15 DIAGNOSIS — E210 Primary hyperparathyroidism: Secondary | ICD-10-CM | POA: Insufficient documentation

## 2014-04-15 DIAGNOSIS — E213 Hyperparathyroidism, unspecified: Secondary | ICD-10-CM

## 2014-04-15 MED ORDER — TRIAMTERENE 50 MG PO CAPS: 50.0000 mg | ORAL_CAPSULE | Freq: Every day | ORAL | Status: DC

## 2014-04-15 MED ORDER — FUROSEMIDE 20 MG PO TABS: 30.0000 mg | ORAL_TABLET | ORAL | Status: DC | PRN

## 2014-04-15 NOTE — Patient Instructions (Signed)
Please stop Maxzide and start Triamterene 50 mg daily. Increase Lasix to 30 mg daily (1.5 tablets).  Please let me know your blood pressure in 1 week.  Please come back for labs in 1 month.  Please come back for a follow-up appointment in 3 months.  Hypercalcemia Hypercalcemia means the calcium in your blood is too high. Calcium in our blood is important for the control of many things, such as:  Blood clotting.  Conducting of nerve impulses.  Muscle contraction.  Maintaining teeth and bone health.  Other body functions. In the bloodstream, calcium maintains a constant balance with another mineral, phosphate. Calcium is absorbed into the body through the small intestine. This is helped by vitamin D. Calcium levels are maintained mostly by vitamin D and a hormone (parathyroid hormone). But the kidneys also help. Hypercalcemia can happen when the concentration of calcium is too high for the kidneys to maintain balance. The body maintains a balance between the calcium we eat and the calcium already in our body. If calcium intake is increased or we cannot use calcium properly, there may be problems. Some common sources of calcium are:   Dairy products.  Nuts.  Eggs.  Whole grains.  Legumes.  Green leafy vegetables. CAUSES There are many causes of this condition, but some common ones are:  Hyperparathyroidism. This is an overactivity of the parathyroid gland.  Cancers of the breast, kidney, lung, head, and neck are common causes of calcium increases.  Medications that cause you to urinate more often (diuretics), nausea, vomiting, and diarrhea also increase the calcium in the blood.  Overuse of calcium-containing antacids. SYMPTOMS  Many patients with mild hypercalcemia have no symptoms. For those with symptoms, common problems include:  Loss of appetite.  Constipation.  Increased thirst.  Heart rhythm changes.  Abnormal thinking.  Nausea.  Abdominal  pain.  Kidney stones.  Mood swings.  Coma and death when severe.  Vomiting.  Increased urination.  High blood pressure.  Confusion. DIAGNOSIS   Your caregiver will do a medical history and perform a physical exam on you.  Calcium and parathyroid hormone (PTH) may be measured with a blood test. TREATMENT   The treatment depends on the calcium level and what is causing the higher level. Hypercalcemia can be life threatening. Fast lowering of the calcium level may be necessary.  With normal kidney function, fluids can be given by vein to clear the excess calcium. Hemodialysis works well to reduce dangerous calcium levels if there is poor kidney function. This is a procedure in which a machine is used to filter out unwanted substances. The blood is then returned to the body.  Drugs, such as diuretics, can be given after adequate fluid intake is established. These medications help the kidneys get rid of extra calcium. Drugs that lessen (inhibit) bone loss are helpful in gaining long-term control. Phosphate pills help lower high calcium levels caused by a low supply of phosphate. Anti-inflammatory agents such as steroids are helpful with some cancers and toxic levels of vitamin D.  Treatment of the underlying cause of the hypercalcemia will also correct the imbalance. Hyperparathyroidism is usually treated by surgical removal of one or more of the parathyroid glands and any tissue, other than the glands themselves, that is producing too much hormone.  The hypercalcemia caused by cancer is difficult to treat without controlling the cancer. Symptoms can be improved with fluids and drug therapy as outlined above. PROGNOSIS   Surgery to remove the parathyroid glands is usually  successful. This also depends on the amount of damage to the kidneys and whether or not it can be treated.  Mild hypercalcemia can be controlled with good fluid intake and the use of effective  medications.  Hypercalcemia often develops as a late complication of cancer. The expected outlook is poor without effective anticancer therapy. PREVENTION   If you are at risk for developing hypercalcemia, be familiar with early symptoms. Report these to your caregiver.  Good fluid intake (up to four quarts of liquid a day if possible) is helpful.  Try to control nausea and vomiting, and treat fevers to avoid dehydration.  Lowering the amount of calcium in your diet is not necessary. High blood calcium reduces absorption of calcium in the intestine.  Stay as active as possible. SEEK IMMEDIATE MEDICAL CARE IF:   You develop chest pain, sweating, or shortness of breath.  You get confused, feel faint or pass out.  You develop severe nausea and vomiting. MAKE SURE YOU:   Understand these instructions.  Will watch your condition.  Will get help right away if you are not doing well or get worse. Document Released: 03/25/2004 Document Revised: 05/26/2013 Document Reviewed: 01/04/2010 [facility] Patient Information 2015 [place], [place]. This information is not intended to replace advice given to you by your health care provider. Make sure you discuss any questions you have with your health care provider.
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
## 2014-04-27 ENCOUNTER — Telehealth (HOSPITAL_COMMUNITY): Payer: Self-pay | Admitting: Vascular Surgery

## 2014-04-27 NOTE — Telephone Encounter (Signed)
Patient B/P readings today @ 2:30 151/95, please advise

## 2014-04-27 NOTE — Telephone Encounter (Signed)
Pt called she saw a endocrinologist the doctor changed a couple of her  medications Furosimde and another that I didn't see on medication list.. Please advise

## 2014-04-27 NOTE — Telephone Encounter (Signed)
Spoke with pt regarding med changes Pt states she was seen by endocrinology for hypercalcemia and hyperthyroid Was told by endocrinology provider that diuretics may cause these conditions   meds changed pt was taking triamterene/hctz changed to triamterene 50 mg daily                                Was taking Lasix 20 mg daily increased to 30 mg daily   Advised pt med changed should be ok with CHF providers, may need re check labs and or follow up to evaluate fluid status  Will forward to provider to confirm if anything additional is needed

## 2014-04-27 NOTE — Telephone Encounter (Signed)
Ok to change, needs BMET 1 week.

## 2014-04-28 NOTE — Telephone Encounter (Signed)
Called pt and lvm per Dr Arman Filter message:  Please advise her to increase Lasix to 2 tabs a day. She is also seeing cardiology, please also tell her to discuss with them. BP was high at the time of the check. We need to keep her off HCTZ.  If she is not following with them, please advise her she may need to see PCP for further med adjustment for her BP, if BP remains high in another week. Advised pt to call back with any questions.

## 2014-04-28 NOTE — Telephone Encounter (Signed)
Pt aware, labs sch for 4/13, med list updated

## 2014-04-28 NOTE — Telephone Encounter (Signed)
Left message to call back  

## 2014-05-06 ENCOUNTER — Ambulatory Visit (HOSPITAL_COMMUNITY)
Admission: RE | Admit: 2014-05-06 | Discharge: 2014-05-06 | Disposition: A | Discharge status: Home / Self Care | Payer: BC Managed Care – PPO | Source: Ambulatory Visit | Attending: Internal Medicine | Admitting: Internal Medicine

## 2014-05-06 DIAGNOSIS — I5032 Chronic diastolic (congestive) heart failure: Secondary | ICD-10-CM | POA: Insufficient documentation

## 2014-05-06 DIAGNOSIS — I5022 Chronic systolic (congestive) heart failure: Secondary | ICD-10-CM

## 2014-05-06 LAB — BASIC METABOLIC PANEL
ANION GAP: 9 (ref 5–15)
BUN: 23 mg/dL (ref 6–23)
CALCIUM: 11 mg/dL — AB (ref 8.4–10.5)
CHLORIDE: 104 mmol/L (ref 96–112)
CO2: 27 mmol/L (ref 19–32)
CREATININE: 1.08 mg/dL (ref 0.50–1.10)
GFR calc Af Amer: 64 mL/min — ABNORMAL LOW (ref >90)
GFR calc non Af Amer: 55 mL/min — ABNORMAL LOW (ref >90)
GLUCOSE: 115 mg/dL — AB (ref 70–99)
Potassium: 4.9 mmol/L (ref 3.5–5.1)
Sodium: 140 mmol/L (ref 135–145)

## 2014-05-11 ENCOUNTER — Other Ambulatory Visit: Payer: Self-pay | Admitting: Cardiology

## 2014-05-12 ENCOUNTER — Other Ambulatory Visit: Payer: Self-pay | Admitting: Internal Medicine

## 2014-05-18 ENCOUNTER — Other Ambulatory Visit (INDEPENDENT_AMBULATORY_CARE_PROVIDER_SITE_OTHER): Payer: BC Managed Care – PPO

## 2014-05-18 DIAGNOSIS — E213 Hyperparathyroidism, unspecified: Secondary | ICD-10-CM

## 2014-05-18 LAB — BASIC METABOLIC PANEL
BUN: 21 mg/dL (ref 6–23)
CO2: 24 mEq/L (ref 19–32)
CREATININE: 0.94 mg/dL (ref 0.40–1.20)
Calcium: 11.1 mg/dL — ABNORMAL HIGH (ref 8.4–10.5)
Chloride: 105 mEq/L (ref 96–112)
GFR: 64.86 mL/min (ref >60.00)
Glucose, Bld: 131 mg/dL — ABNORMAL HIGH (ref 70–99)
Potassium: 4.3 mEq/L (ref 3.5–5.1)
SODIUM: 137 meq/L (ref 135–145)

## 2014-05-18 LAB — MAGNESIUM: Magnesium: 1.9 mg/dL (ref 1.5–2.5)

## 2014-05-18 LAB — PHOSPHORUS: PHOSPHORUS: 1.9 mg/dL — AB (ref 2.3–4.6)

## 2014-05-19 LAB — VITAMIN D 25 HYDROXY (VIT D DEFICIENCY, FRACTURES): VITD: 42.89 ng/mL (ref 30.00–100.00)

## 2014-05-19 LAB — PARATHYROID HORMONE, INTACT (NO CA): PTH: 32 pg/mL (ref 15–65)

## 2014-05-22 NOTE — Addendum Note (Signed)
Addended by: Philemon Kingdom on: 05/22/2014 12:30 PM   Modules accepted: Level of Service

## 2014-05-25 ENCOUNTER — Ambulatory Visit (INDEPENDENT_AMBULATORY_CARE_PROVIDER_SITE_OTHER): Payer: BC Managed Care – PPO | Admitting: Family Medicine

## 2014-05-25 ENCOUNTER — Encounter: Payer: Self-pay | Admitting: Family Medicine

## 2014-05-25 ENCOUNTER — Other Ambulatory Visit: Payer: Self-pay

## 2014-05-25 VITALS — BP 128/72 | HR 90 | Temp 98.5°F | Resp 17 | Wt 319.2 lb

## 2014-05-25 DIAGNOSIS — J4521 Mild intermittent asthma with (acute) exacerbation: Secondary | ICD-10-CM

## 2014-05-25 DIAGNOSIS — J0100 Acute maxillary sinusitis, unspecified: Secondary | ICD-10-CM

## 2014-05-25 DIAGNOSIS — J45901 Unspecified asthma with (acute) exacerbation: Secondary | ICD-10-CM | POA: Insufficient documentation

## 2014-05-25 MED ORDER — PROMETHAZINE-DM 6.25-15 MG/5ML PO SYRP: 5.0000 mL | ORAL_SOLUTION | Freq: Four times a day (QID) | ORAL | Status: DC | PRN

## 2014-05-25 MED ORDER — IPRATROPIUM-ALBUTEROL 0.5-2.5 (3) MG/3ML IN SOLN
3.0000 mL | Freq: Once | RESPIRATORY_TRACT | Status: AC
  Administered 2014-05-25: 3 mL via RESPIRATORY_TRACT

## 2014-05-25 MED ORDER — AMOXICILLIN 875 MG PO TABS: 875.0000 mg | ORAL_TABLET | Freq: Two times a day (BID) | ORAL | Status: DC

## 2014-05-25 NOTE — Patient Instructions (Signed)
Schedule your complete physical for after July 28th Start the Amoxicillin twice daily- take w/ food Mucinex DM for daytime cough, cough syrup for night- will cause drowsiness Drink plenty of fluids Use your inhaler as needed for cough or shortness of breath Call with any questions or concerns Hang in there!

## 2014-05-25 NOTE — Assessment & Plan Note (Signed)
New.  Pt w/ hx of asthma.  Current infxn has triggered exacerbation.  Pt's air movement and cough improved s/p neb tx in office.  Pt to use albuterol inhaler prn and cough meds.  Reviewed supportive care and red flags that should prompt return.  Pt expressed understanding and is in agreement w/ plan.

## 2014-05-25 NOTE — Addendum Note (Signed)
Addended by: Kris Hartmann on: 05/25/2014 11:55 AM   Modules accepted: Orders

## 2014-05-25 NOTE — Progress Notes (Signed)
   Subjective:    Patient ID: Kathleen [name], female    DOB: 1955/07/27, 59 y.o.   MRN: [id number]  HPI URI- sxs started ~1 week ago.  Started w/ viral URI sxs- runny nose, cough.  As of Saturday pt thought she was going to go to hospital b/c 'i couldn't breathe and it felt like my face was falling off'.  Breathing has improved.  + sinus pain/pressure, tooth pain.  Tm 100 last night.  + sick contacts.  No N/V.  Pt reports wheezing w/ coughing fits.  + hx of asthma- used albuterol inhaler on Saturday when SOB w/o much improvement.   Review of Systems For ROS see HPI     Objective:   Physical Exam  Constitutional: She is oriented to person, place, and time. She appears well-developed and well-nourished. No distress.  HENT:  Head: Normocephalic and atraumatic.  Right Ear: Tympanic membrane normal.  Left Ear: Tympanic membrane normal.  Nose: Mucosal edema and rhinorrhea present. Right sinus exhibits maxillary sinus tenderness and frontal sinus tenderness. Left sinus exhibits maxillary sinus tenderness and frontal sinus tenderness.  Mouth/Throat: Uvula is midline and mucous membranes are normal. Posterior oropharyngeal erythema present. No oropharyngeal exudate.  Eyes: Conjunctivae and EOM are normal. Pupils are equal, round, and reactive to light.  Neck: Normal range of motion. Neck supple.  Cardiovascular: Normal rate, regular rhythm and normal heart sounds.   Pulmonary/Chest: Effort normal and breath sounds normal. No respiratory distress. She has no wheezes. She has no rales.  Hacking cough- improved s/p neb tx  Lymphadenopathy:    She has no cervical adenopathy.  Neurological: She is alert and oriented to person, place, and time.  Skin: Skin is warm and dry.  Psychiatric: She has a normal mood and affect. Her behavior is normal. Thought content normal.  Vitals reviewed.         Assessment & Plan:

## 2014-05-25 NOTE — Progress Notes (Signed)
Pre visit review using our clinic review tool, if applicable. No additional management support is needed unless otherwise documented below in the visit note. 

## 2014-05-25 NOTE — Assessment & Plan Note (Signed)
Pt's sxs and PE consistent w/ infxn.  Start abx.  Cough meds prn.  Reviewed supportive care and red flags that should prompt return.  Pt expressed understanding and is in agreement w/ plan.  

## 2014-05-26 ENCOUNTER — Other Ambulatory Visit: Payer: Self-pay

## 2014-05-26 LAB — CALCIUM, URINE, 24 HOUR
Calcium, 24 hour urine: 204 mg/d (ref 100–250)
Calcium, Ur: 12 mg/dL

## 2014-05-26 LAB — CREATININE, URINE, 24 HOUR
CREATININE, URINE: 101 mg/dL
Creatinine, 24H Ur: 1718 mg/d (ref 700–1800)

## 2014-05-26 MED ORDER — LISINOPRIL 10 MG PO TABS: 10.0000 mg | ORAL_TABLET | Freq: Every day | ORAL | Status: DC

## 2014-05-26 NOTE — Addendum Note (Signed)
Addended by: Philemon Kingdom on: 05/26/2014 08:12 AM   Modules accepted: Orders, Level of Service

## 2014-05-26 NOTE — Telephone Encounter (Signed)
Rx(s) sent to pharmacy electronically.  

## 2014-06-03 ENCOUNTER — Other Ambulatory Visit (HOSPITAL_COMMUNITY): Payer: Self-pay | Admitting: Internal Medicine

## 2014-06-08 ENCOUNTER — Telehealth: Payer: Self-pay | Admitting: *Deleted

## 2014-06-08 ENCOUNTER — Other Ambulatory Visit: Payer: Self-pay | Admitting: Internal Medicine

## 2014-06-08 ENCOUNTER — Other Ambulatory Visit (HOSPITAL_COMMUNITY): Payer: Self-pay | Admitting: Cardiology

## 2014-06-08 NOTE — Telephone Encounter (Signed)
Called pt to see if she had been contacted by the surgeon's office. Pt stated she has an appt on May 23rd. Be advised.

## 2014-06-08 NOTE — Telephone Encounter (Signed)
OK, thank you for checking.

## 2014-06-12 ENCOUNTER — Other Ambulatory Visit: Payer: Self-pay | Admitting: *Deleted

## 2014-06-12 MED ORDER — TRIAMTERENE 50 MG PO CAPS: 50.0000 mg | ORAL_CAPSULE | Freq: Every day | ORAL | Status: DC

## 2014-06-12 NOTE — Telephone Encounter (Signed)
Refill sent to pharmacy.   

## 2014-06-15 ENCOUNTER — Other Ambulatory Visit: Payer: Self-pay | Admitting: Surgery

## 2014-06-16 ENCOUNTER — Other Ambulatory Visit: Payer: Self-pay | Admitting: Surgery

## 2014-06-24 ENCOUNTER — Other Ambulatory Visit: Payer: Self-pay

## 2014-06-24 DIAGNOSIS — Z1231 Encounter for screening mammogram for malignant neoplasm of breast: Secondary | ICD-10-CM

## 2014-07-10 ENCOUNTER — Encounter (HOSPITAL_COMMUNITY)
Admission: RE | Admit: 2014-07-10 | Discharge: 2014-07-10 | Disposition: A | Discharge status: Home / Self Care | Payer: BC Managed Care – PPO | Source: Ambulatory Visit | Attending: Surgery | Admitting: Surgery

## 2014-07-10 ENCOUNTER — Ambulatory Visit (HOSPITAL_COMMUNITY)
Admission: RE | Admit: 2014-07-10 | Discharge: 2014-07-10 | Disposition: A | Discharge status: Home / Self Care | Payer: BC Managed Care – PPO | Source: Ambulatory Visit | Attending: Surgery | Admitting: Surgery

## 2014-07-10 MED ORDER — TECHNETIUM TC 99M SESTAMIBI GENERIC - CARDIOLITE: 27.0000 | Freq: Once | INTRAVENOUS | Status: AC | PRN

## 2014-07-11 ENCOUNTER — Other Ambulatory Visit (HOSPITAL_COMMUNITY): Payer: Self-pay | Admitting: Anesthesiology

## 2014-07-20 ENCOUNTER — Ambulatory Visit: Payer: Self-pay | Admitting: Surgery

## 2014-07-22 ENCOUNTER — Ambulatory Visit (HOSPITAL_BASED_OUTPATIENT_CLINIC_OR_DEPARTMENT_OTHER)
Admission: RE | Admit: 2014-07-22 | Discharge: 2014-07-22 | Disposition: A | Discharge status: Home / Self Care | Payer: BC Managed Care – PPO | Source: Ambulatory Visit | Attending: Internal Medicine | Admitting: Internal Medicine

## 2014-07-22 ENCOUNTER — Ambulatory Visit (INDEPENDENT_AMBULATORY_CARE_PROVIDER_SITE_OTHER): Payer: BC Managed Care – PPO | Admitting: Internal Medicine

## 2014-07-22 ENCOUNTER — Encounter: Payer: Self-pay | Admitting: Internal Medicine

## 2014-07-22 VITALS — BP 146/80 | HR 97 | Temp 98.3°F | Resp 16 | Wt 324.4 lb

## 2014-07-22 DIAGNOSIS — S4992XA Unspecified injury of left shoulder and upper arm, initial encounter: Secondary | ICD-10-CM

## 2014-07-22 DIAGNOSIS — M25612 Stiffness of left shoulder, not elsewhere classified: Secondary | ICD-10-CM | POA: Insufficient documentation

## 2014-07-22 DIAGNOSIS — M25512 Pain in left shoulder: Secondary | ICD-10-CM | POA: Insufficient documentation

## 2014-07-22 NOTE — Patient Instructions (Signed)
Go downstairs to get the x-ray  Use the sling   Tylenol as needed for pain  We are referring you to a orthopedic doctor
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
## 2014-07-22 NOTE — Progress Notes (Signed)
Pre visit review using our clinic review tool, if applicable. No additional management support is needed unless otherwise documented below in the visit note. 

## 2014-07-30 ENCOUNTER — Encounter: Payer: Self-pay | Admitting: Internal Medicine

## 2014-07-30 ENCOUNTER — Ambulatory Visit (INDEPENDENT_AMBULATORY_CARE_PROVIDER_SITE_OTHER): Payer: BC Managed Care – PPO | Admitting: Internal Medicine

## 2014-07-30 VITALS — BP 124/78 | HR 88 | Temp 98.7°F | Resp 14 | Wt 324.6 lb

## 2014-07-30 DIAGNOSIS — E213 Hyperparathyroidism, unspecified: Secondary | ICD-10-CM | POA: Diagnosis not present
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
## 2014-07-30 NOTE — Patient Instructions (Signed)
Please stop at the lab.  Please come back for a follow-up appointment in 3 months  

## 2014-07-31 LAB — VITAMIN D 25 HYDROXY (VIT D DEFICIENCY, FRACTURES): VITD: 39.58 ng/mL (ref 30.00–100.00)

## 2014-08-01 LAB — PTH, INTACT AND CALCIUM
CALCIUM: 11.2 mg/dL — AB (ref 8.7–10.2)
PTH: 105 pg/mL — AB (ref 15–65)

## 2014-08-05 ENCOUNTER — Encounter: Payer: Self-pay | Admitting: Internal Medicine

## 2014-08-10 ENCOUNTER — Other Ambulatory Visit: Payer: Self-pay | Admitting: Family Medicine

## 2014-08-10 NOTE — Telephone Encounter (Signed)
Med filled.  

## 2014-08-14 ENCOUNTER — Ambulatory Visit (HOSPITAL_COMMUNITY)
Admission: RE | Admit: 2014-08-14 | Discharge: 2014-08-14 | Disposition: A | Discharge status: Home / Self Care | Payer: BC Managed Care – PPO | Source: Ambulatory Visit | Attending: Internal Medicine | Admitting: Internal Medicine

## 2014-08-14 VITALS — BP 134/78 | HR 81 | Wt 323.5 lb

## 2014-08-14 DIAGNOSIS — Z01810 Encounter for preprocedural cardiovascular examination: Secondary | ICD-10-CM

## 2014-08-14 DIAGNOSIS — I509 Heart failure, unspecified: Secondary | ICD-10-CM | POA: Insufficient documentation

## 2014-08-14 DIAGNOSIS — I5032 Chronic diastolic (congestive) heart failure: Secondary | ICD-10-CM

## 2014-08-14 NOTE — Progress Notes (Signed)
Patient ID: ARLINDA BARCELONA, female   DOB: [birth year], 59 y.o.   MRN: [id number]   HPI Ms. Durflinger is a 26 y/o woman (mother of [name] - CCU nurse) with a h/o morbid obesity, HTN, HL and CAD.  She presented initially for evaluation of SOB and LE edema.   Had NSTEMI in 2/09 had cath at that time with occlusion of septal perforator. Otherwise normal arteries. Was admitted for CP in 11/13. CE normal. Underwent dobutamine stress echo which was normal.   She had a sleep study in 11/15 with minimal OSA.    She returns for HF follow up. Weight at home 323. Not taking extra lasix. Ongoing mild dyspnea with exertion. Denies PND/orthopnea.  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  Not exercising.   Echo 11/13 EF 60-65% Mild LAE. RV normal. No PH. No comment on diastolic function.  ECHO 09/04/13 EF 55-60%  Grade I DD  Labs:  09/25/13 K 4.1 Creatinine 1.0 , LDL 79  Allergies  Allergen Reactions  . Lamictal [Lamotrigine]     Rash   . Eggs Or Egg-Derived Products Hives    Can eat foods with cooked eggs; CANNOT handle when part of flu vaccine, etc.     Current Outpatient Prescriptions  Medication Sig Dispense Refill  . albuterol (PROVENTIL HFA;VENTOLIN HFA) 108 (90 BASE) MCG/ACT inhaler Inhale 2 puffs into the lungs every 6 (six) hours as needed for wheezing. 1 Inhaler 1  . ALPRAZolam (XANAX) 0.5 MG tablet Take 0.5 mg by mouth 2 (two) times daily as needed for anxiety.    [name] aspirin 81 MG tablet Take 81 mg by mouth at bedtime.     . Cholecalciferol (VITAMIN D3) 5000 UNITS CAPS Take 5,000 Units by mouth daily.    . clopidogrel (PLAVIX) 75 MG tablet TAKE 1 TABLET (75 MG TOTAL) BY MOUTH AT BEDTIME. 30 tablet 5  . Cranberry-Cholecalciferol 4200-500 MG-UNIT CAPS Take 1 capsule by mouth at bedtime.    . divalproex (DEPAKOTE ER) 500 MG 24 hr tablet Take 1,500 mg by mouth at bedtime.     . furosemide (LASIX) 20 MG tablet Take 20 mg by mouth daily.    [name] lisinopril (PRINIVIL,ZESTRIL) 10 MG  tablet Take 1 tablet (10 mg total) by mouth daily. NEEDS APPOINTMENT WITH DR Martinique FOR FUTURE REFILLS 30 tablet 0  . lurasidone (LATUDA) 40 MG TABS tablet Take 40 mg by mouth at bedtime.     . nitroGLYCERIN (NITROSTAT) 0.4 MG SL tablet Place 1 tablet (0.4 mg total) under the tongue every 5 (five) minutes as needed for chest pain. For chest pain 25 tablet 3  . OLANZapine (ZYPREXA) 10 MG tablet Take 10 mg by mouth at bedtime.    . potassium chloride SA (K-DUR,KLOR-CON) 20 MEQ tablet Take 20 mEq by mouth daily.    . simvastatin (ZOCOR) 20 MG tablet TAKE 1 TABLET (20 MG TOTAL) BY MOUTH AT BEDTIME. 90 tablet 0  . SUPER B COMPLEX/C PO Take 1 capsule by mouth daily.     No current facility-administered medications for this encounter.    Past Medical History  Diagnosis Date  . 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 Hypertension   . Depression   . Lower extremity deep venous thrombosis   . Dyslipidemia   . Psychiatric hospitalization 05/2008  . Asthma   . Coronary artery disease   . Anxiety   . Morbid obesity with BMI of 40.0-44.9, adult   .  Hyperlipidemia   . Knee pain, left     Past Surgical History  Procedure Laterality Date  . Breast mass excision      aGe 17, benign tumor  . Knee surgery      right, removed cartilage  . Tubal ligation    . Tonsillectomy    . Dilation and curettage of uterus      ROS:  As stated in the HPI and negative for all other systems.  PHYSICAL EXAM BP 134/78 mmHg  Pulse 81  Wt 323 lb 8 oz (146.739 kg)  SpO2 98% GENERAL:  Obese, NAD.  HEENT:  Normal NECK:  Supple. JVP hard to see but does not appear elevated.  Carotid upstroke brisk and symmetric, no bruits, no thyromegaly LUNGS:  Clear to auscultation bilaterally CHEST:  Unremarkable HEART:  PMI not displaced or sustained,S1 and S2 within normal limits, no S3, no S4, no clicks, no rubs, no murmurs ABD:  Obese. Soft NT/ND , no rebound, no guarding,  EXT:  2 plus  pulses throughout,  No edema. No cyanosis no clubbing, chronic venous stasis changes with 1+ ankle edema bilaterally.    ASSESSMENT AND PLAN 1. Dyspnea: Probably due to a combination of obesity/deconditioning and diastolic CHF.  2. Chronic diastolic HF: Normal EF with grade I diastolic dysfunction on recent echo. Volume status improved since restarting lasix. Continue lasix 40 mg daily  3. Morbid obesity: She needs to continue aggressive efforts at diet and exercise for weight loss.  4. CAD: She will continue ASA 81, Plavix, statin.  Good LDL in 9/15.  5. HTN: BP controlled.  6. Snoring: Had sleep study. No sleep apnea.  7.  Hyperparathyroidism- Plan for partial parathyroidectomy next month   She is a low to moderate risk from cardiac standpoint. [place] for surgery.    Follow up in 6 months.    CLEGG,[name] NP-C  08/14/2014  Patient seen and examined with Darrick Grinder, NP. We discussed all aspects of the encounter. I agree with the assessment and plan as stated above.   Volume status much better since restarting lasix. She is at low to moderate risk for surgery from a cardiac standpoint. Can proceed without further testing.   Anthonio Mizzell,MD 11:37 PM

## 2014-08-14 NOTE — Patient Instructions (Signed)
Hold Plavix starting July 27th   We will contact you in 6 months to schedule your next appointment.

## 2014-08-20 ENCOUNTER — Other Ambulatory Visit: Payer: Self-pay | Admitting: Obstetrics and Gynecology

## 2014-08-20 ENCOUNTER — Encounter (HOSPITAL_COMMUNITY)
Admission: RE | Admit: 2014-08-20 | Discharge: 2014-08-20 | Disposition: A | Discharge status: Home / Self Care | Payer: BC Managed Care – PPO | Source: Ambulatory Visit | Attending: Surgery | Admitting: Surgery

## 2014-08-20 ENCOUNTER — Encounter (HOSPITAL_COMMUNITY): Payer: Self-pay

## 2014-08-20 DIAGNOSIS — Z01818 Encounter for other preprocedural examination: Secondary | ICD-10-CM | POA: Insufficient documentation

## 2014-08-20 DIAGNOSIS — E210 Primary hyperparathyroidism: Secondary | ICD-10-CM | POA: Insufficient documentation

## 2014-08-20 HISTORY — DX: Malignant (primary) neoplasm, unspecified: C80.1

## 2014-08-20 HISTORY — DX: Nausea with vomiting, unspecified: R11.2

## 2014-08-20 HISTORY — DX: Reserved for inherently not codable concepts without codable children: IMO0001

## 2014-08-20 HISTORY — DX: Other specified postprocedural states: Z98.890

## 2014-08-20 HISTORY — DX: Anemia, unspecified: D64.9

## 2014-08-20 HISTORY — DX: Unspecified fall, initial encounter: W19.XXXA

## 2014-08-20 HISTORY — DX: Unspecified urinary incontinence: R32

## 2014-08-20 HISTORY — DX: Heart failure, unspecified: I50.9

## 2014-08-20 HISTORY — DX: Zoster without complications: B02.9

## 2014-08-20 HISTORY — DX: Pneumonia, unspecified organism: J18.9

## 2014-08-20 HISTORY — DX: Measles without complication: B05.9

## 2014-08-20 HISTORY — DX: Bronchitis, not specified as acute or chronic: J40

## 2014-08-20 LAB — BASIC METABOLIC PANEL
Anion gap: 11 (ref 5–15)
BUN: 18 mg/dL (ref 6–20)
CO2: 25 mmol/L (ref 22–32)
Calcium: 10.6 mg/dL — ABNORMAL HIGH (ref 8.9–10.3)
Chloride: 104 mmol/L (ref 101–111)
Creatinine, Ser: 1 mg/dL (ref 0.44–1.00)
GFR calc Af Amer: >60 mL/min (ref >60)
GFR calc non Af Amer: >60 mL/min (ref >60)
Glucose, Bld: 192 mg/dL — ABNORMAL HIGH (ref 65–99)
POTASSIUM: 3.9 mmol/L (ref 3.5–5.1)
SODIUM: 140 mmol/L (ref 135–145)

## 2014-08-20 LAB — CBC
HCT: 45.3 % (ref 36.0–46.0)
Hemoglobin: 15 g/dL (ref 12.0–15.0)
MCH: 29.5 pg (ref 26.0–34.0)
MCHC: 33.1 g/dL (ref 30.0–36.0)
MCV: 89.2 fL (ref 78.0–100.0)
Platelets: 135 10*3/uL — ABNORMAL LOW (ref 150–400)
RBC: 5.08 MIL/uL (ref 3.87–5.11)
RDW: 13.3 % (ref 11.5–15.5)
WBC: 4.6 10*3/uL (ref 4.0–10.5)

## 2014-08-20 NOTE — Progress Notes (Signed)
Your patient has screened at an elevated risk for Obstructive Sleep Apnea using the Stop-Bang Tool during a pre-surgical vist. A score of 4 or greater is an elevated risk. Score of 6.

## 2014-08-20 NOTE — Progress Notes (Signed)
CBC results in epic per PAT visit 08/20/2014 sent to Dr Harlow Asa

## 2014-08-20 NOTE — Patient Instructions (Signed)
Kathleen [name]  08/20/2014   Your procedure is scheduled on: Tuesday August 25, 2014   Report to [facility] Main  Entrance take [place]  elevators to 3rd floor to  [place] at 5:00 AM.  Call this number if you have problems the morning of surgery [phone]   Remember: ONLY 1 PERSON MAY GO WITH YOU TO SHORT STAY TO GET  READY MORNING OF [place].  Do not eat food or drink liquids :After Midnight.     Take these medicines the morning of surgery with A SIP OF WATER: Alprazolam (Xanax) if needed; Albuterol Inhaler if needed;   PER MD INSTRUCTION - STOP ASPIRIN 5 DAYS PRIOR TO SURGERY; STOP PLAVIX BY 08/19/2014                                You may not have any metal on your body including hair pins and              piercings  Do not wear jewelry, make-up, lotions, powders or perfumes, deodorant             Do not wear nail polish.  Do not shave  48 hours prior to surgery.                Do not bring valuables to the hospital. [place].  Contacts, dentures or bridgework may not be worn into surgery.      Patients discharged the day of surgery will not be allowed to drive home.  Name and phone number of your driver:Clay Lacy Duverney (friend)   _____________________________________________________________________             [facility] - Preparing for Surgery Before surgery, you can play an important role.  Because skin is not sterile, your skin needs to be as free of germs as possible.  You can reduce the number of germs on your skin by washing with CHG (chlorahexidine gluconate) soap before surgery.  CHG is an antiseptic cleaner which kills germs and bonds with the skin to continue killing germs even after washing. Please DO NOT use if you have an allergy to CHG or antibacterial soaps.  If your skin becomes reddened/irritated stop using the CHG and inform your nurse when you arrive at Short Stay. Do not shave  (including legs and underarms) for at least 48 hours prior to the first CHG shower.  You may shave your face/neck. Please follow these instructions carefully:  1.  Shower with CHG Soap the night before surgery and the  morning of Surgery.  2.  If you choose to wash your hair, wash your hair first as usual with your  normal  shampoo.  3.  After you shampoo, rinse your hair and body thoroughly to remove the  shampoo.                           4.  Use CHG as you would any other liquid soap.  You can apply chg directly  to the skin and wash                       Gently with a scrungie or clean  washcloth.  5.  Apply the CHG Soap to your body ONLY FROM THE NECK DOWN.   Do not use on face/ open                           Wound or open sores. Avoid contact with eyes, ears mouth and genitals (private parts).                       Wash face,  Genitals (private parts) with your normal soap.             6.  Wash thoroughly, paying special attention to the area where your surgery  will be performed.  7.  Thoroughly rinse your body with warm water from the neck down.  8.  DO NOT shower/wash with your normal soap after using and rinsing off  the CHG Soap.                9.  Pat yourself dry with a clean towel.            10.  Wear clean pajamas.            11.  Place clean sheets on your bed the night of your first shower and do not  sleep with pets. Day of Surgery : Do not apply any lotions/deodorants the morning of surgery.  Please wear clean clothes to the hospital/surgery center.  FAILURE TO FOLLOW THESE INSTRUCTIONS MAY RESULT IN THE CANCELLATION OF YOUR SURGERY PATIENT SIGNATURE_________________________________  NURSE SIGNATURE__________________________________  ________________________________________________________________________

## 2014-08-20 NOTE — Progress Notes (Signed)
OV Note per epic with clearance per heart/vascular spec clinic 08/14/2014 ECHO/epic 09/04/2013 Stress test 12/07/2011  EKG / epic 03/20/2014

## 2014-08-21 LAB — CYTOLOGY - PAP

## 2014-08-22 DIAGNOSIS — Z01810 Encounter for preprocedural cardiovascular examination: Secondary | ICD-10-CM | POA: Insufficient documentation

## 2014-08-24 ENCOUNTER — Encounter (HOSPITAL_COMMUNITY): Payer: Self-pay | Admitting: Surgery

## 2014-08-24 MED ORDER — DEXTROSE 5 % IV SOLN
3.0000 g | INTRAVENOUS | Status: AC
  Administered 2014-08-25: 3 g via INTRAVENOUS
  Filled 2014-08-24: qty 3000

## 2014-08-24 NOTE — H&P (Signed)
General Surgery [facility]  [name] DOB: December 08, 1955 Single / Language: Cleophus Molt / Race: White Female  History of Present Illness  The patient is a 59 year old female who presents with a parathyroid neoplasm. Patient is referred by Dr. Philemon Kingdom for evaluation of suspected primary hyperparathyroidism. Patient was initially noted to have hypercalcemia in 2014. Laboratory studies over the past 2 years if shown a progressive rise in serum calcium level to a recent level of 11.3. Intact PTH levels are elevated to 147. 24-hour urine collection was in the upper normal range at 203. Vitamin D level was normal. Of note, the patient has previously been treated for hyperthyroidism. Patient also is bipolar and has taken lithium in the past. He has been at least 3 years since she was last on lithium. Patient does take Plavix with a history of myocardial infarction 6 years ago. She continues to be followed closely by her cardiologist. Patient has had a bone density scan which was reportedly normal. Patient denies nephrolithiasis. She has had no imaging studies.   Other Problems Anxiety Disorder Asthma Back Pain Chest pain Congestive Heart Failure Depression High blood pressure Hypercholesterolemia Lump In Breast Migraine Headache Myocardial infarction Thyroid Disease Umbilical Hernia Repair  Past Surgical History Breast Biopsy Left. Breast Mass; Local Excision Left. Tonsillectomy Ventral / Umbilical Hernia Surgery Bilateral.  Diagnostic Studies History Colonoscopy 5-10 years ago Mammogram within last year Pap Smear 1-5 years ago  Medication History ALPRAZolam (0.5MG  Tablet, Oral) Active. Clopidogrel Bisulfate (75MG  Tablet, Oral) Active. Cyclobenzaprine HCl (10MG  Tablet, Oral) Active. Divalproex Sodium ER (500MG  Tablet ER 24HR, Oral) Active. Dyrenium (50MG  Capsule, Oral) Active. Furosemide (20MG  Tablet, Oral)  Active. Latuda (40MG  Tablet, Oral) Active. Lisinopril (10MG  Tablet, Oral) Active. OLANZapine (10MG  Tablet, Oral) Active. Simvastatin (20MG  Tablet, Oral) Active. Triamterene-HCTZ (37.5-25MG  Tablet, Oral) Active. Triamcinolone Acetonide (0.1% Cream, External) Active. Medications Reconciled  Social History Alcohol use Occasional alcohol use. Caffeine use Carbonated beverages, Coffee. No drug use Tobacco use Never smoker.  Family History Breast Cancer Mother. Cancer Mother. Cerebrovascular Accident Mother. Depression Brother, Father, Mother, Sister. Diabetes Mellitus Family Members In General. Hypertension Brother, Father, Mother, Sister. Melanoma Father. Migraine Headache Mother, Sister. Thyroid problems Mother, Sister.  Pregnancy / Birth History Age at menarche 67 years. Age of menopause 21-55 Gravida 3 Maternal age 47-25 Para 3  Review of Systems General Present- Fatigue and Weight Gain. Not Present- Appetite Loss, Chills, Fever, Night Sweats and Weight Loss. Skin Not Present- Change in Wart/Mole, Dryness, Hives, Jaundice, New Lesions, Non-Healing Wounds, Rash and Ulcer. HEENT Present- Wears glasses/contact lenses. Not Present- Earache, Hearing Loss, Hoarseness, Nose Bleed, Oral Ulcers, Ringing in the Ears, Seasonal Allergies, Sinus Pain, Sore Throat, Visual Disturbances and Yellow Eyes. Respiratory Not Present- Bloody sputum, Chronic Cough, Difficulty Breathing, Snoring and Wheezing. Breast Not Present- Breast Mass, Breast Pain, Nipple Discharge and Skin Changes. Cardiovascular Present- Shortness of Breath and Swelling of Extremities. Not Present- Chest Pain, Difficulty Breathing Lying Down, Leg Cramps, Palpitations and Rapid Heart Rate. Gastrointestinal Not Present- Abdominal Pain, Bloating, Bloody Stool, Change in Bowel Habits, Chronic diarrhea, Constipation, Difficulty Swallowing, Excessive gas, Gets full quickly at meals, Hemorrhoids, Indigestion,  Nausea, Rectal Pain and Vomiting. Female Genitourinary Present- Urgency. Not Present- Frequency, Nocturia, Painful Urination and Pelvic Pain. Neurological Not Present- Decreased Memory, Fainting, Headaches, Numbness, Seizures, Tingling, Tremor, Trouble walking and Weakness. Psychiatric Present- Bipolar. Not Present- Anxiety, Change in Sleep Pattern, Depression, Fearful and Frequent crying. Endocrine Not Present- Cold Intolerance, Excessive Hunger, Hair  Changes, Heat Intolerance, Hot flashes and New Diabetes. Hematology Present- Easy Bruising. Not Present- Excessive bleeding, Gland problems, HIV and Persistent Infections.   Vitals Weight: 321.8 lb Height: 72in Body Surface Area: 2.72 m Body Mass Index: 43.64 kg/m Temp.: 97.75F(Temporal)  Pulse: 75 (Regular)  BP: 124/80 (Sitting, Left Arm, Standard)  Physical Exam General - appears comfortable, no distress; not diaphorectic  HEENT - normocephalic; sclerae clear, gaze conjugate; mucous membranes moist, dentition good; voice normal  Neck - symmetric on extension; no palpable anterior or posterior cervical adenopathy; no palpable masses in the thyroid bed  Chest - clear bilaterally without rhonchi, rales, or wheeze  Cor - regular rhythm with normal rate; no significant murmur  Ext - non-tender without significant edema or lymphedema  Neuro - grossly intact; moderate tremor in both hands    Assessment & Plan  PRIMARY HYPERPARATHYROIDISM (252.01  E21.0)  Patient presents with signs and symptoms of primary hyperparathyroidism on referral from her endocrinologist. I discussed her records in detail with the patient and her daughter today. I provided them with written literature to review at home regarding parathyroid disease and parathyroid surgery.  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  Recommend minimally invasive parathyroidectomy as an out-patient surgery.  The risks and  benefits of the procedure have been discussed at length with the patient.  The patient understands the proposed procedure, potential alternative treatments, and the course of recovery to be expected.  All of the patient's questions have been answered at this time.  The patient wishes to proceed with surgery.  Earnstine Regal, MD, [facility] Office: [phone]

## 2014-08-25 ENCOUNTER — Ambulatory Visit (HOSPITAL_COMMUNITY)
Admission: RE | Admit: 2014-08-25 | Discharge: 2014-08-25 | Disposition: A | Discharge status: Home / Self Care | Payer: BC Managed Care – PPO | Source: Ambulatory Visit | Attending: Surgery | Admitting: Surgery

## 2014-08-25 ENCOUNTER — Encounter (HOSPITAL_COMMUNITY)
Admission: RE | Disposition: A | Discharge status: Home / Self Care | Payer: Self-pay | Source: Ambulatory Visit | Attending: Surgery

## 2014-08-25 ENCOUNTER — Ambulatory Visit (HOSPITAL_COMMUNITY): Payer: BC Managed Care – PPO | Admitting: Anesthesiology

## 2014-08-25 ENCOUNTER — Encounter (HOSPITAL_COMMUNITY): Payer: Self-pay | Admitting: *Deleted

## 2014-08-25 DIAGNOSIS — F319 Bipolar disorder, unspecified: Secondary | ICD-10-CM | POA: Diagnosis not present

## 2014-08-25 DIAGNOSIS — E210 Primary hyperparathyroidism: Secondary | ICD-10-CM | POA: Insufficient documentation

## 2014-08-25 DIAGNOSIS — F419 Anxiety disorder, unspecified: Secondary | ICD-10-CM | POA: Diagnosis not present

## 2014-08-25 DIAGNOSIS — E780 Pure hypercholesterolemia: Secondary | ICD-10-CM | POA: Insufficient documentation

## 2014-08-25 DIAGNOSIS — I252 Old myocardial infarction: Secondary | ICD-10-CM | POA: Insufficient documentation

## 2014-08-25 DIAGNOSIS — J45909 Unspecified asthma, uncomplicated: Secondary | ICD-10-CM | POA: Insufficient documentation

## 2014-08-25 DIAGNOSIS — Z7902 Long term (current) use of antithrombotics/antiplatelets: Secondary | ICD-10-CM | POA: Diagnosis not present

## 2014-08-25 DIAGNOSIS — I10 Essential (primary) hypertension: Secondary | ICD-10-CM | POA: Insufficient documentation

## 2014-08-25 DIAGNOSIS — I739 Peripheral vascular disease, unspecified: Secondary | ICD-10-CM | POA: Insufficient documentation

## 2014-08-25 DIAGNOSIS — I2510 Atherosclerotic heart disease of native coronary artery without angina pectoris: Secondary | ICD-10-CM | POA: Diagnosis not present

## 2014-08-25 DIAGNOSIS — Z79899 Other long term (current) drug therapy: Secondary | ICD-10-CM | POA: Insufficient documentation

## 2014-08-25 DIAGNOSIS — G4730 Sleep apnea, unspecified: Secondary | ICD-10-CM | POA: Diagnosis not present

## 2014-08-25 DIAGNOSIS — D351 Benign neoplasm of parathyroid gland: Secondary | ICD-10-CM | POA: Insufficient documentation

## 2014-08-25 DIAGNOSIS — G43909 Migraine, unspecified, not intractable, without status migrainosus: Secondary | ICD-10-CM | POA: Insufficient documentation

## 2014-08-25 DIAGNOSIS — I509 Heart failure, unspecified: Secondary | ICD-10-CM | POA: Diagnosis not present

## 2014-08-25 HISTORY — PX: PARATHYROIDECTOMY: SHX19

## 2014-08-25 SURGERY — PARATHYROIDECTOMY
Anesthesia: General | Site: Neck

## 2014-08-25 MED ORDER — ROCURONIUM BROMIDE 100 MG/10ML IV SOLN
INTRAVENOUS | Status: DC | PRN
  Administered 2014-08-25: 30 mg via INTRAVENOUS

## 2014-08-25 MED ORDER — GLYCOPYRROLATE 0.2 MG/ML IJ SOLN
INTRAMUSCULAR | Status: DC | PRN
  Administered 2014-08-25: 0.6 mg via INTRAVENOUS

## 2014-08-25 MED ORDER — PROPOFOL 10 MG/ML IV BOLUS
INTRAVENOUS | Status: AC
  Filled 2014-08-25: qty 20

## 2014-08-25 MED ORDER — 0.9 % SODIUM CHLORIDE (POUR BTL) OPTIME
TOPICAL | Status: DC | PRN
  Administered 2014-08-25: 1000 mL

## 2014-08-25 MED ORDER — LIDOCAINE HCL (CARDIAC) 20 MG/ML IV SOLN
INTRAVENOUS | Status: DC | PRN
  Administered 2014-08-25: 100 mg via INTRAVENOUS

## 2014-08-25 MED ORDER — NEOSTIGMINE METHYLSULFATE 10 MG/10ML IV SOLN
INTRAVENOUS | Status: DC | PRN
  Administered 2014-08-25: 4 mg via INTRAVENOUS

## 2014-08-25 MED ORDER — FENTANYL CITRATE (PF) 100 MCG/2ML IJ SOLN
25.0000 ug | INTRAMUSCULAR | Status: DC | PRN
  Administered 2014-08-25 (×2): 25 ug via INTRAVENOUS

## 2014-08-25 MED ORDER — ONDANSETRON HCL 4 MG/2ML IJ SOLN
INTRAMUSCULAR | Status: DC | PRN
  Administered 2014-08-25: 4 mg via INTRAVENOUS

## 2014-08-25 MED ORDER — FENTANYL CITRATE (PF) 100 MCG/2ML IJ SOLN
INTRAMUSCULAR | Status: AC
  Filled 2014-08-25: qty 2

## 2014-08-25 MED ORDER — ROCURONIUM BROMIDE 100 MG/10ML IV SOLN
INTRAVENOUS | Status: AC
  Filled 2014-08-25: qty 1

## 2014-08-25 MED ORDER — BUPIVACAINE-EPINEPHRINE (PF) 0.25% -1:200000 IJ SOLN
INTRAMUSCULAR | Status: AC
  Filled 2014-08-25: qty 30

## 2014-08-25 MED ORDER — LACTATED RINGERS IV SOLN
INTRAVENOUS | Status: DC | PRN
  Administered 2014-08-25: 07:00:00 via INTRAVENOUS

## 2014-08-25 MED ORDER — PROMETHAZINE HCL 25 MG/ML IJ SOLN: 6.2500 mg | INTRAMUSCULAR | Status: DC | PRN

## 2014-08-25 MED ORDER — ONDANSETRON HCL 4 MG/2ML IJ SOLN
INTRAMUSCULAR | Status: AC
Start: 2014-08-25 — End: 2014-08-25
  Filled 2014-08-25: qty 2

## 2014-08-25 MED ORDER — MIDAZOLAM HCL 5 MG/5ML IJ SOLN
INTRAMUSCULAR | Status: DC | PRN
  Administered 2014-08-25: 2 mg via INTRAVENOUS

## 2014-08-25 MED ORDER — FENTANYL CITRATE (PF) 250 MCG/5ML IJ SOLN
INTRAMUSCULAR | Status: AC
  Filled 2014-08-25: qty 25

## 2014-08-25 MED ORDER — HYDROCODONE-ACETAMINOPHEN 5-325 MG PO TABS: 1.0000 | ORAL_TABLET | ORAL | Status: DC | PRN

## 2014-08-25 MED ORDER — MIDAZOLAM HCL 2 MG/2ML IJ SOLN
INTRAMUSCULAR | Status: AC
  Filled 2014-08-25: qty 4

## 2014-08-25 MED ORDER — LIDOCAINE HCL (CARDIAC) 20 MG/ML IV SOLN
INTRAVENOUS | Status: AC
  Filled 2014-08-25: qty 5

## 2014-08-25 MED ORDER — BUPIVACAINE HCL 0.25 % IJ SOLN
INTRAMUSCULAR | Status: DC | PRN
  Administered 2014-08-25: 10 mL

## 2014-08-25 MED ORDER — SUCCINYLCHOLINE CHLORIDE 20 MG/ML IJ SOLN
INTRAMUSCULAR | Status: DC | PRN
  Administered 2014-08-25: 100 mg via INTRAVENOUS

## 2014-08-25 MED ORDER — HYDROCODONE-ACETAMINOPHEN 5-325 MG PO TABS
1.0000 | ORAL_TABLET | ORAL | Status: DC | PRN
  Administered 2014-08-25: 1 via ORAL
  Filled 2014-08-25: qty 1

## 2014-08-25 MED ORDER — FENTANYL CITRATE (PF) 100 MCG/2ML IJ SOLN
INTRAMUSCULAR | Status: DC | PRN
  Administered 2014-08-25: 75 ug via INTRAVENOUS
  Administered 2014-08-25: 25 ug via INTRAVENOUS
  Administered 2014-08-25 (×3): 50 ug via INTRAVENOUS

## 2014-08-25 MED ORDER — PROPOFOL 10 MG/ML IV BOLUS
INTRAVENOUS | Status: DC | PRN
  Administered 2014-08-25: 50 mg via INTRAVENOUS
  Administered 2014-08-25: 150 mg via INTRAVENOUS

## 2014-08-25 SURGICAL SUPPLY — 39 items
APL SKNCLS STERI-STRIP NONHPOA (GAUZE/BANDAGES/DRESSINGS) ×1
ATTRACTOMAT 16X20 MAGNETIC DRP (DRAPES) ×2 IMPLANT
BENZOIN TINCTURE PRP APPL 2/3 (GAUZE/BANDAGES/DRESSINGS) ×1 IMPLANT
BLADE HEX COATED 2.75 (ELECTRODE) ×2 IMPLANT
BLADE SURG 15 STRL LF DISP TIS (BLADE) ×1 IMPLANT
BLADE SURG 15 STRL SS (BLADE) ×2
CHLORAPREP W/TINT 26ML (MISCELLANEOUS) ×2 IMPLANT
CLIP TI MEDIUM 6 (CLIP) ×4 IMPLANT
CLIP TI WIDE RED SMALL 6 (CLIP) ×4 IMPLANT
COVER SURGICAL LIGHT HANDLE (MISCELLANEOUS) ×2 IMPLANT
DRAPE LAPAROTOMY T 98X78 PEDS (DRAPES) ×2 IMPLANT
DRESSING SURGICEL FIBRLLR 1X2 (HEMOSTASIS) ×1 IMPLANT
DRSG SURGICEL FIBRILLAR 1X2 (HEMOSTASIS) ×2
ELECT PENCIL ROCKER SW 15FT (MISCELLANEOUS) ×2 IMPLANT
ELECT REM PT RETURN 9FT ADLT (ELECTROSURGICAL) ×2
ELECTRODE REM PT RTRN 9FT ADLT (ELECTROSURGICAL) ×1 IMPLANT
GAUZE SPONGE 4X4 12PLY STRL (GAUZE/BANDAGES/DRESSINGS) ×1 IMPLANT
GAUZE SPONGE 4X4 16PLY XRAY LF (GAUZE/BANDAGES/DRESSINGS) ×2 IMPLANT
GLOVE SURG ORTHO 8.0 STRL STRW (GLOVE) ×2 IMPLANT
GOWN STRL REUS W/TWL XL LVL3 (GOWN DISPOSABLE) ×6 IMPLANT
KIT BASIN OR (CUSTOM PROCEDURE TRAY) ×2 IMPLANT
LIQUID BAND (GAUZE/BANDAGES/DRESSINGS) ×1 IMPLANT
NDL HYPO 25X1 1.5 SAFETY (NEEDLE) ×1 IMPLANT
NEEDLE HYPO 25X1 1.5 SAFETY (NEEDLE) ×2 IMPLANT
PACK BASIC VI WITH GOWN DISP (CUSTOM PROCEDURE TRAY) ×2 IMPLANT
STAPLER VISISTAT 35W (STAPLE) ×2 IMPLANT
STRIP CLOSURE SKIN 1/2X4 (GAUZE/BANDAGES/DRESSINGS) ×1 IMPLANT
SUT MNCRL AB 4-0 PS2 18 (SUTURE) ×2 IMPLANT
SUT SILK 2 0 (SUTURE) ×2
SUT SILK 2-0 18XBRD TIE 12 (SUTURE) IMPLANT
SUT SILK 3 0 (SUTURE)
SUT SILK 3-0 18XBRD TIE 12 (SUTURE) IMPLANT
SUT VIC AB 3-0 SH 18 (SUTURE) ×3 IMPLANT
SYR BULB IRRIGATION 50ML (SYRINGE) ×2 IMPLANT
SYR CONTROL 10ML LL (SYRINGE) ×2 IMPLANT
TAPE CLOTH SURG 6X10 WHT LF (GAUZE/BANDAGES/DRESSINGS) ×1 IMPLANT
TOWEL OR 17X26 10 PK STRL BLUE (TOWEL DISPOSABLE) ×2 IMPLANT
TOWEL OR NON WOVEN STRL DISP B (DISPOSABLE) ×2 IMPLANT
YANKAUER SUCT BULB TIP 10FT TU (MISCELLANEOUS) ×2 IMPLANT

## 2014-08-25 NOTE — Progress Notes (Signed)
Pt drinking gingerale and  complains of "feeling like it is going down the wrong way".  She coughs after each swallow, appears to get a little strangled.  O2 sats 98% RA, vital signs stable and she does not appear to be in any distress.   I notified Dr. Oletta Lamas (anesthesia) and Dr Harlow Asa of pt's complaint.  Dr. Harlow Asa will be up to see pt shortly.  No new orders given.

## 2014-08-25 NOTE — Anesthesia Procedure Notes (Signed)
Procedure Name: Intubation Date/Time: 08/25/2014 7:32 AM Performed by: Chyrel Masson Pre-anesthesia Checklist: Patient identified, Emergency Drugs available, Suction available, Timeout performed and Patient being monitored Patient Re-evaluated:Patient Re-evaluated prior to inductionOxygen Delivery Method: Circle system utilized and Simple face mask Preoxygenation: Pre-oxygenation with 100% oxygen Intubation Type: IV induction Ventilation: Mask ventilation without difficulty Laryngoscope Size: Mac and 4 Grade View: Grade III Tube type: Oral Tube size: 7.5 mm Number of attempts: 1 Airway Equipment and Method: Stylet Placement Confirmation: ETT inserted through vocal cords under direct vision,  positive ETCO2 and breath sounds checked- equal and bilateral Secured at: 23 cm Tube secured with: Tape Dental Injury: Teeth and Oropharynx as per pre-operative assessment

## 2014-08-25 NOTE — Transfer of Care (Signed)
Immediate Anesthesia Transfer of Care Note  Patient: Kathleen [name]  Procedure(s) Performed: Procedure(s): PARATHYROIDECTOMY (N/A)  Patient Location: PACU  Anesthesia Type:General  Level of Consciousness: awake, alert  and oriented  Airway & Oxygen Therapy: Patient Spontanous Breathing and Patient connected to face mask oxygen  Post-op Assessment: Report given to RN and Post -op Vital signs reviewed and stable  Post vital signs: Reviewed and stable  Last Vitals:  Filed Vitals:   08/25/14 0521  BP: 139/82  Pulse: 92  Temp: 36.7 C  Resp: 18    Complications: No apparent anesthesia complications

## 2014-08-25 NOTE — Anesthesia Postprocedure Evaluation (Signed)
  Anesthesia Post-op Note  Patient: Kathleen [name]  Procedure(s) Performed: Procedure(s): PARATHYROIDECTOMY (N/A)  Patient Location: PACU  Anesthesia Type:General  Level of Consciousness: awake  Airway and Oxygen Therapy: Patient Spontanous Breathing  Post-op Pain: mild  Post-op Assessment: Post-op Vital signs reviewed              Post-op Vital Signs: Reviewed  Last Vitals:  Filed Vitals:   08/25/14 0915  BP: 154/84  Pulse: 84  Temp:   Resp: 14    Complications: No apparent anesthesia complications

## 2014-08-25 NOTE — Progress Notes (Signed)
General Surgery [facility]  Patient seen post op in Short Stay.  Doing well.  Taking po liquids and medications.  Voice normal.  Dressing dry and intact.  OK for discharge home today.  Earnstine Regal, MD, [facility] Office: [phone]

## 2014-08-25 NOTE — Anesthesia Preprocedure Evaluation (Addendum)
Anesthesia Evaluation  Patient identified by MRN, date of birth, ID band Patient awake    Reviewed: Allergy & Precautions, NPO status , Patient's Chart, lab work & pertinent test results  History of Anesthesia Complications (+) PONV  Airway Mallampati: II  TM Distance: >3 FB Neck ROM: Full    Dental   Pulmonary shortness of breath, asthma , sleep apnea , pneumonia -,  breath sounds clear to auscultation        Cardiovascular hypertension, + CAD, + Past MI, + Peripheral Vascular Disease and +CHF Rhythm:Regular Rate:Normal     Neuro/Psych    GI/Hepatic negative GI ROS, Neg liver ROS,   Endo/Other    Renal/GU Renal disease     Musculoskeletal   Abdominal   Peds  Hematology   Anesthesia Other Findings   Reproductive/Obstetrics                            Anesthesia Physical Anesthesia Plan  ASA: III  Anesthesia Plan: General   Post-op Pain Management:    Induction: Intravenous  Airway Management Planned: Oral ETT  Additional Equipment:   Intra-op Plan:   Post-operative Plan: Possible Post-op intubation/ventilation  Informed Consent: I have reviewed the patients History and Physical, chart, labs and discussed the procedure including the risks, benefits and alternatives for the proposed anesthesia with the patient or authorized representative who has indicated his/her understanding and acceptance.   Dental advisory given  Plan Discussed with: CRNA and Anesthesiologist  Anesthesia Plan Comments:         Anesthesia Quick Evaluation

## 2014-08-25 NOTE — Op Note (Addendum)
OPERATIVE REPORT - PARATHYROIDECTOMY  Preoperative diagnosis: Primary hyperparathyroidism  Postop diagnosis: Same  Procedure: Left inferior minimally invasive parathyroidectomy  Surgeon:  Earnstine Regal, MD, FACS  Anesthesia: Gen. endotracheal  Estimated blood loss: Minimal  Preparation: ChloraPrep  Indications: The patient is a 59 year old female who presents with a parathyroid neoplasm. Patient is referred by Dr. Philemon Kingdom for evaluation of suspected primary hyperparathyroidism. Patient was initially noted to have hypercalcemia in 2014. Laboratory studies over the past 2 years if shown a progressive rise in serum calcium level to a recent level of 11.3. Intact PTH levels are elevated to 147. 24-hour urine collection was in the upper normal range at 203. Nuclear med parathyroid scan localized an adenoma to the left inferior position.  Patient now comes for minimally invasive surgery.  Procedure: Patient was prepared in the holding area. He was brought to operating room and placed in a supine position on the operating room table. Following administration of general anesthesia, the patient was positioned and then prepped and draped in the usual strict aseptic fashion. After ascertaining that an adequate level of anesthesia been achieved, a neck incision was made with a #15 blade. Dissection was carried through subcutaneous tissues and platysma. Hemostasis was obtained with the electrocautery. Skin flaps were developed circumferentially and a Weitlander retractor was placed for exposure.  Strap muscles were incised in the midline. Strap muscles were reflected exposing the thyroid lobe. With gentle blunt dissection the thyroid lobe was mobilized.  Dissection was carried through adipose tissue and an enlarged parathyroid gland was identified. It was gently mobilized. Vascular structures were divided between small and medium ligaclips. Care was taken to mobilize the recurrent laryngeal  nerve which was intimately apposed to the anterior surface of the adenoma. The parathyroid gland was completely excised. It was submitted to pathology where frozen section confirmed parathyroid tissue consistent with adenoma.  Neck was irrigated with warm saline and good hemostasis was noted. Fibrillar was placed in the operative field. Strap muscles were reapproximated in the midline with interrupted 3-0 Vicryl sutures. Platysma was closed with interrupted 3-0 Vicryl sutures. Skin was closed with a running 4-0 Monocryl subcuticular suture. Marcaine was infiltrated circumferentially. Wound was washed and dried and benzoin and Steri-Strips were applied. Sterile gauze dressings were applied. Patient was awakened from anesthesia and brought to the recovery room. The patient tolerated the procedure well.   Earnstine Regal, MD, [facility]

## 2014-08-25 NOTE — Interval H&P Note (Signed)
History and Physical Interval Note:  08/25/2014 7:13 AM  Kathleen [name]  has presented today for surgery, with the diagnosis of primary hyperparathyroidism.  The various methods of treatment have been discussed with the patient and family. After consideration of risks, benefits and other options for treatment, the patient has consented to    Procedure(s): PARATHYROIDECTOMY (N/A) as a surgical intervention .    The patient's history has been reviewed, patient examined, no change in status, stable for surgery.  I have reviewed the patient's chart and labs.  Questions were answered to the patient's satisfaction.    Earnstine Regal, MD, [facility] Office: [place]

## 2014-08-25 NOTE — Discharge Instructions (Signed)

## 2014-08-26 ENCOUNTER — Encounter (HOSPITAL_COMMUNITY): Payer: Self-pay | Admitting: Surgery

## 2014-08-31 ENCOUNTER — Ambulatory Visit: Payer: Self-pay

## 2014-09-09 ENCOUNTER — Ambulatory Visit
Admission: RE | Admit: 2014-09-09 | Discharge: 2014-09-09 | Disposition: A | Discharge status: Home / Self Care | Payer: BC Managed Care – PPO | Source: Ambulatory Visit

## 2014-09-09 DIAGNOSIS — Z1231 Encounter for screening mammogram for malignant neoplasm of breast: Secondary | ICD-10-CM

## 2014-09-11 ENCOUNTER — Telehealth: Payer: Self-pay | Admitting: Family Medicine

## 2014-09-11 ENCOUNTER — Encounter (HOSPITAL_BASED_OUTPATIENT_CLINIC_OR_DEPARTMENT_OTHER): Payer: Self-pay

## 2014-09-11 ENCOUNTER — Inpatient Hospital Stay (HOSPITAL_BASED_OUTPATIENT_CLINIC_OR_DEPARTMENT_OTHER)
Admission: EM | Admit: 2014-09-11 | Discharge: 2014-09-13 | DRG: 309 | Disposition: A | Discharge status: Home / Self Care | Payer: BC Managed Care – PPO | Attending: Internal Medicine | Admitting: Internal Medicine

## 2014-09-11 ENCOUNTER — Emergency Department (HOSPITAL_BASED_OUTPATIENT_CLINIC_OR_DEPARTMENT_OTHER): Payer: BC Managed Care – PPO

## 2014-09-11 ENCOUNTER — Inpatient Hospital Stay (HOSPITAL_BASED_OUTPATIENT_CLINIC_OR_DEPARTMENT_OTHER): Payer: BC Managed Care – PPO

## 2014-09-11 DIAGNOSIS — R079 Chest pain, unspecified: Secondary | ICD-10-CM | POA: Diagnosis not present

## 2014-09-11 DIAGNOSIS — E213 Hyperparathyroidism, unspecified: Secondary | ICD-10-CM | POA: Diagnosis present

## 2014-09-11 DIAGNOSIS — J45909 Unspecified asthma, uncomplicated: Secondary | ICD-10-CM | POA: Diagnosis present

## 2014-09-11 DIAGNOSIS — E669 Obesity, unspecified: Secondary | ICD-10-CM | POA: Diagnosis present

## 2014-09-11 DIAGNOSIS — I5030 Unspecified diastolic (congestive) heart failure: Secondary | ICD-10-CM | POA: Diagnosis present

## 2014-09-11 DIAGNOSIS — Z7902 Long term (current) use of antithrombotics/antiplatelets: Secondary | ICD-10-CM | POA: Diagnosis not present

## 2014-09-11 DIAGNOSIS — Z888 Allergy status to other drugs, medicaments and biological substances status: Secondary | ICD-10-CM

## 2014-09-11 DIAGNOSIS — I4891 Unspecified atrial fibrillation: Secondary | ICD-10-CM | POA: Diagnosis not present

## 2014-09-11 DIAGNOSIS — Z7982 Long term (current) use of aspirin: Secondary | ICD-10-CM | POA: Diagnosis not present

## 2014-09-11 DIAGNOSIS — Z86718 Personal history of other venous thrombosis and embolism: Secondary | ICD-10-CM

## 2014-09-11 DIAGNOSIS — I252 Old myocardial infarction: Secondary | ICD-10-CM | POA: Diagnosis not present

## 2014-09-11 DIAGNOSIS — Z79891 Long term (current) use of opiate analgesic: Secondary | ICD-10-CM

## 2014-09-11 DIAGNOSIS — Z79899 Other long term (current) drug therapy: Secondary | ICD-10-CM

## 2014-09-11 DIAGNOSIS — I2510 Atherosclerotic heart disease of native coronary artery without angina pectoris: Secondary | ICD-10-CM | POA: Diagnosis present

## 2014-09-11 DIAGNOSIS — E785 Hyperlipidemia, unspecified: Secondary | ICD-10-CM | POA: Diagnosis present

## 2014-09-11 DIAGNOSIS — I480 Paroxysmal atrial fibrillation: Secondary | ICD-10-CM | POA: Diagnosis present

## 2014-09-11 DIAGNOSIS — I10 Essential (primary) hypertension: Secondary | ICD-10-CM | POA: Diagnosis present

## 2014-09-11 DIAGNOSIS — F329 Major depressive disorder, single episode, unspecified: Secondary | ICD-10-CM | POA: Diagnosis present

## 2014-09-11 DIAGNOSIS — Z6841 Body Mass Index (BMI) 40.0 and over, adult: Secondary | ICD-10-CM | POA: Diagnosis not present

## 2014-09-11 DIAGNOSIS — F419 Anxiety disorder, unspecified: Secondary | ICD-10-CM | POA: Diagnosis present

## 2014-09-11 DIAGNOSIS — R0789 Other chest pain: Secondary | ICD-10-CM | POA: Diagnosis not present

## 2014-09-11 LAB — CBC
HEMATOCRIT: 44.9 % (ref 36.0–46.0)
Hemoglobin: 15 g/dL (ref 12.0–15.0)
MCH: 29.6 pg (ref 26.0–34.0)
MCHC: 33.4 g/dL (ref 30.0–36.0)
MCV: 88.6 fL (ref 78.0–100.0)
PLATELETS: 175 10*3/uL (ref 150–400)
RBC: 5.07 MIL/uL (ref 3.87–5.11)
RDW: 13.2 % (ref 11.5–15.5)
WBC: 8.1 10*3/uL (ref 4.0–10.5)

## 2014-09-11 LAB — CBC WITH DIFFERENTIAL/PLATELET
BASOS ABS: 0 10*3/uL (ref 0.0–0.1)
Basophils Relative: 0 % (ref 0–1)
Eosinophils Absolute: 0.1 10*3/uL (ref 0.0–0.7)
Eosinophils Relative: 2 % (ref 0–5)
HEMATOCRIT: 44.8 % (ref 36.0–46.0)
HEMOGLOBIN: 15.2 g/dL — AB (ref 12.0–15.0)
LYMPHS PCT: 21 % (ref 12–46)
Lymphs Abs: 1.7 10*3/uL (ref 0.7–4.0)
MCH: 29.7 pg (ref 26.0–34.0)
MCHC: 33.9 g/dL (ref 30.0–36.0)
MCV: 87.5 fL (ref 78.0–100.0)
Monocytes Absolute: 0.7 10*3/uL (ref 0.1–1.0)
Monocytes Relative: 8 % (ref 3–12)
NEUTROS ABS: 5.7 10*3/uL (ref 1.7–7.7)
NEUTROS PCT: 69 % (ref 43–77)
PLATELETS: 160 10*3/uL (ref 150–400)
RBC: 5.12 MIL/uL — AB (ref 3.87–5.11)
RDW: 13.2 % (ref 11.5–15.5)
WBC: 8.2 10*3/uL (ref 4.0–10.5)

## 2014-09-11 LAB — BASIC METABOLIC PANEL
ANION GAP: 11 (ref 5–15)
BUN: 20 mg/dL (ref 6–20)
CHLORIDE: 107 mmol/L (ref 101–111)
CO2: 25 mmol/L (ref 22–32)
Calcium: 9.3 mg/dL (ref 8.9–10.3)
Creatinine, Ser: 1.07 mg/dL — ABNORMAL HIGH (ref 0.44–1.00)
GFR calc non Af Amer: 56 mL/min — ABNORMAL LOW (ref >60)
Glucose, Bld: 168 mg/dL — ABNORMAL HIGH (ref 65–99)
Potassium: 3.5 mmol/L (ref 3.5–5.1)
Sodium: 143 mmol/L (ref 135–145)

## 2014-09-11 LAB — TROPONIN I: Troponin I: <0.03 ng/mL (ref <0.031)

## 2014-09-11 LAB — CREATININE, SERUM
Creatinine, Ser: 0.99 mg/dL (ref 0.44–1.00)
GFR calc Af Amer: >60 mL/min (ref >60)
GFR calc non Af Amer: >60 mL/min (ref >60)

## 2014-09-11 LAB — BRAIN NATRIURETIC PEPTIDE: B Natriuretic Peptide: 90.9 pg/mL (ref 0.0–100.0)

## 2014-09-11 LAB — TSH: TSH: 4.972 u[IU]/mL — ABNORMAL HIGH (ref 0.350–4.500)

## 2014-09-11 MED ORDER — ONDANSETRON HCL 4 MG/2ML IJ SOLN: 4.0000 mg | Freq: Four times a day (QID) | INTRAMUSCULAR | Status: DC | PRN

## 2014-09-11 MED ORDER — CLOPIDOGREL BISULFATE 75 MG PO TABS
75.0000 mg | ORAL_TABLET | Freq: Every day | ORAL | Status: DC
  Administered 2014-09-12: 75 mg via ORAL
  Filled 2014-09-11: qty 1

## 2014-09-11 MED ORDER — ALBUTEROL SULFATE HFA 108 (90 BASE) MCG/ACT IN AERS: 2.0000 | INHALATION_SPRAY | Freq: Four times a day (QID) | RESPIRATORY_TRACT | Status: DC | PRN

## 2014-09-11 MED ORDER — ALPRAZOLAM 0.5 MG PO TABS: 0.5000 mg | ORAL_TABLET | Freq: Two times a day (BID) | ORAL | Status: DC | PRN

## 2014-09-11 MED ORDER — NITROGLYCERIN 0.4 MG SL SUBL
0.4000 mg | SUBLINGUAL_TABLET | SUBLINGUAL | Status: DC | PRN
  Administered 2014-09-11: 0.4 mg via SUBLINGUAL
  Filled 2014-09-11: qty 1

## 2014-09-11 MED ORDER — ALPRAZOLAM 0.5 MG PO TABS
0.5000 mg | ORAL_TABLET | Freq: Once | ORAL | Status: AC
  Administered 2014-09-11: 0.5 mg via ORAL
  Filled 2014-09-11: qty 1

## 2014-09-11 MED ORDER — SIMVASTATIN 20 MG PO TABS
20.0000 mg | ORAL_TABLET | Freq: Every day | ORAL | Status: DC
  Administered 2014-09-12: 20 mg via ORAL
  Filled 2014-09-11 (×2): qty 1

## 2014-09-11 MED ORDER — ASPIRIN EC 81 MG PO TBEC: 81.0000 mg | DELAYED_RELEASE_TABLET | Freq: Every day | ORAL | Status: DC

## 2014-09-11 MED ORDER — METOPROLOL TARTRATE 12.5 MG HALF TABLET
12.5000 mg | ORAL_TABLET | Freq: Two times a day (BID) | ORAL | Status: DC
  Administered 2014-09-12 (×2): 12.5 mg via ORAL
  Filled 2014-09-11 (×3): qty 1

## 2014-09-11 MED ORDER — LURASIDONE HCL 40 MG PO TABS
40.0000 mg | ORAL_TABLET | Freq: Every day | ORAL | Status: DC
  Administered 2014-09-12 (×2): 40 mg via ORAL
  Filled 2014-09-11 (×4): qty 1

## 2014-09-11 MED ORDER — HYDROCODONE-ACETAMINOPHEN 5-325 MG PO TABS: 1.0000 | ORAL_TABLET | ORAL | Status: DC | PRN

## 2014-09-11 MED ORDER — DILTIAZEM HCL 100 MG IV SOLR
5.0000 mg/h | INTRAVENOUS | Status: DC
  Administered 2014-09-11: 5 mg/h via INTRAVENOUS
  Filled 2014-09-11 (×2): qty 100

## 2014-09-11 MED ORDER — OLANZAPINE 10 MG PO TABS
10.0000 mg | ORAL_TABLET | Freq: Every day | ORAL | Status: DC
  Administered 2014-09-12 (×2): 10 mg via ORAL
  Filled 2014-09-11 (×4): qty 1

## 2014-09-11 MED ORDER — ASPIRIN 81 MG PO CHEW
324.0000 mg | CHEWABLE_TABLET | Freq: Once | ORAL | Status: AC
  Administered 2014-09-11: 324 mg via ORAL
  Filled 2014-09-11: qty 4

## 2014-09-11 MED ORDER — DILTIAZEM LOAD VIA INFUSION
20.0000 mg | Freq: Once | INTRAVENOUS | Status: AC
  Administered 2014-09-11: 20 mg via INTRAVENOUS
  Filled 2014-09-11: qty 20

## 2014-09-11 MED ORDER — IOHEXOL 350 MG/ML SOLN
100.0000 mL | Freq: Once | INTRAVENOUS | Status: AC | PRN
  Administered 2014-09-11: 100 mL via INTRAVENOUS

## 2014-09-11 MED ORDER — NITROGLYCERIN 0.4 MG SL SUBL: 0.4000 mg | SUBLINGUAL_TABLET | SUBLINGUAL | Status: DC | PRN

## 2014-09-11 MED ORDER — ENOXAPARIN SODIUM 40 MG/0.4ML ~~LOC~~ SOLN
40.0000 mg | SUBCUTANEOUS | Status: DC
  Administered 2014-09-12: 40 mg via SUBCUTANEOUS
  Filled 2014-09-11: qty 0.4

## 2014-09-11 MED ORDER — DIVALPROEX SODIUM ER 500 MG PO TB24
1500.0000 mg | ORAL_TABLET | Freq: Every day | ORAL | Status: DC
  Administered 2014-09-12 (×2): 1500 mg via ORAL
  Filled 2014-09-11 (×4): qty 3

## 2014-09-11 MED ORDER — ACETAMINOPHEN 325 MG PO TABS: 650.0000 mg | ORAL_TABLET | ORAL | Status: DC | PRN

## 2014-09-11 NOTE — ED Notes (Signed)
Onset of continuous left chest wall pain radiating to left arm and shoulder today at 2pm while watching tv.  Associated symptoms nausea, dizziness, diaphoresis and SOB.
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
## 2014-09-11 NOTE — ED Provider Notes (Signed)
CSN: [id number]   Arrival date & time 09/11/14 1849  History  This chart was scribed for Malvin Johns, MD by Altamease Oiler, ED Scribe. This patient was seen in room MH11/MH11 and the patient's care was started at 7:03 PM.  Chief Complaint  Patient presents with  . Chest Pain    HPI The history is provided by the patient. No language interpreter was used.   Kathleen [name] is a 59 y.o. female with PMHx of CAD, MI, CHF, hyperthyroidism, HTN, and HLD,  who presents to the Emergency Department complaining of constant left-sided chest pain with sudden onset around 2 PM. She describes the pain as pressure and rates it 5/10 in severity. Associated symptoms include dizziness, radiating left arm/shoulder pain, SOB, fluttering palpitations, and nausea. She has had similar episodes in the past that resolved spontaneously after several hours.  Pt denies LE swelling. Not currently on any medication for her thyroid. No known history of a-fib.  Cardiology: Dr. Hayden Pedro Past Medical History  Diagnosis Date  . 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 Hypertension   . Depression   . Dyslipidemia   . Psychiatric hospitalization 05/2008  . Asthma   . Coronary artery disease   . Anxiety   . Morbid obesity with BMI of 40.0-44.9, adult   . Hyperlipidemia   . Knee pain, left   . PONV (postoperative nausea and vomiting)   . Lower extremity deep venous thrombosis     20 years ago   . CHF (congestive heart failure)     pt seen at heart/vascular spec clinic 08/14/2014   . Shortness of breath dyspnea     exercise   . Pneumonia     hx of   . Bronchitis     hx of   . Fall   . Urinary tract bacterial infections   . Urinary incontinence   . Cancer     basal cell carcinoma on right hand   . Anemia   . Shingles     20 years ago   . Measles     hx of in childhood     Past Surgical History  Procedure Laterality Date  . Breast mass excision      age 65, benign tumor  . Knee  surgery      right, removed cartilage  . Tubal ligation    . Tonsillectomy    . Dilation and curettage of uterus    . Cardiac catheterization    . Parathyroidectomy N/A 08/25/2014    Procedure: PARATHYROIDECTOMY;  Surgeon: Armandina Gemma, MD;  Location: [facility];  Service: General;  Laterality: N/A;    Family History  Problem Relation Age of Onset  . Breast cancer Mother 32  . Alcohol abuse Mother   . Alcohol abuse Father   . Alcohol abuse Maternal Grandfather   . Drug abuse Maternal Grandfather   . Alcohol abuse Maternal Grandmother   . Alcohol abuse Paternal Grandfather   . Alcohol abuse Paternal Grandmother   . Schizophrenia Cousin     Social History  Substance Use Topics  . Smoking status: Never Smoker   . Smokeless tobacco: Never Used  . Alcohol Use: 0.0 oz/week    0 Glasses of wine per week     Comment: occ.     Review of Systems  Constitutional: Positive for diaphoresis and fatigue. Negative for fever and chills.  HENT: Negative for congestion, rhinorrhea and sneezing.   Eyes: Negative.  Respiratory: Positive for chest tightness and shortness of breath. Negative for cough.   Cardiovascular: Negative for chest pain and leg swelling.  Gastrointestinal: Positive for nausea. Negative for vomiting, abdominal pain, diarrhea and blood in stool.  Genitourinary: Negative for frequency, hematuria, flank pain and difficulty urinating.  Musculoskeletal: Negative for back pain and arthralgias.  Skin: Negative for rash.  Neurological: Negative for dizziness, speech difficulty, weakness, numbness and headaches.     Home Medications   Prior to Admission medications   Medication Sig Start Date End Date Taking? Authorizing Provider  albuterol (PROVENTIL HFA;VENTOLIN HFA) 108 (90 BASE) MCG/ACT inhaler Inhale 2 puffs into the lungs every 6 (six) hours as needed for wheezing. 11/12/12   Rosalita Chessman, DO  ALPRAZolam Duanne Moron) 0.5 MG tablet Take 0.5 mg by mouth 2 (two) times daily as  needed for anxiety.    Historical Provider, MD  aspirin 81 MG tablet Take 81 mg by mouth at bedtime.     Historical Provider, MD  Cholecalciferol (VITAMIN D3) 5000 UNITS CAPS Take 5,000 Units by mouth daily.    Historical Provider, MD  clopidogrel (PLAVIX) 75 MG tablet TAKE 1 TABLET (75 MG TOTAL) BY MOUTH AT BEDTIME. 07/13/14   Jolaine Artist, MD  Cranberry-Cholecalciferol 4200-500 MG-UNIT CAPS Take 1 capsule by mouth at bedtime.    Historical Provider, MD  divalproex (DEPAKOTE ER) 500 MG 24 hr tablet Take 1,500 mg by mouth at bedtime.     Historical Provider, MD  furosemide (LASIX) 20 MG tablet Take 20 mg by mouth daily.    Historical Provider, MD  HYDROcodone-acetaminophen (NORCO/VICODIN) 5-325 MG per tablet Take 1-2 tablets by mouth every 4 (four) hours as needed for moderate pain. 08/25/14   Armandina Gemma, MD  lisinopril (PRINIVIL,ZESTRIL) 10 MG tablet Take 1 tablet (10 mg total) by mouth daily. NEEDS APPOINTMENT WITH DR Martinique FOR FUTURE REFILLS 05/26/14   Peter M Martinique, MD  lurasidone (LATUDA) 40 MG TABS tablet Take 40 mg by mouth at bedtime.     Historical Provider, MD  nitroGLYCERIN (NITROSTAT) 0.4 MG SL tablet Place 1 tablet (0.4 mg total) under the tongue every 5 (five) minutes as needed for chest pain. For chest pain 12/07/11   Evelene Croon Barrett, PA-C  OLANZapine (ZYPREXA) 10 MG tablet Take 10 mg by mouth at bedtime.    Historical Provider, MD  potassium chloride SA (K-DUR,KLOR-CON) 20 MEQ tablet Take 20 mEq by mouth daily.    Historical Provider, MD  simvastatin (ZOCOR) 20 MG tablet TAKE 1 TABLET (20 MG TOTAL) BY MOUTH AT BEDTIME. 08/10/14   Midge Minium, MD  SUPER B COMPLEX/C PO Take 1 capsule by mouth daily.    Historical Provider, MD    Allergies  Lamictal and Eggs or egg-derived products  Triage Vitals: BP 140/72 mmHg  Pulse 163  Resp 18  Ht 6' (1.829 m)  Wt 326 lb (147.873 kg)  BMI 44.20 kg/m2  SpO2 95%  Physical Exam  Constitutional: She is oriented to person, place,  and time. She appears well-developed and well-nourished.  HENT:  Head: Normocephalic and atraumatic.  Eyes: Pupils are equal, round, and reactive to light.  Neck: Normal range of motion. Neck supple.  Cardiovascular: Normal rate, regular rhythm and normal heart sounds.   Pulmonary/Chest: Effort normal and breath sounds normal. No respiratory distress. She has no wheezes. She has no rales. She exhibits no tenderness.  Abdominal: Soft. Bowel sounds are normal. There is no tenderness. There is no rebound and no  guarding.  Musculoskeletal: Normal range of motion. She exhibits edema.  Lymphadenopathy:    She has no cervical adenopathy.  Neurological: She is alert and oriented to person, place, and time.  Skin: Skin is warm and dry. No rash noted.  Psychiatric: She has a normal mood and affect.     ED Course  Procedures   DIAGNOSTIC STUDIES: Oxygen Saturation is 95% on RA, normal by my interpretation.    COORDINATION OF CARE: 7:08 PM Discussed treatment plan which includes lab work, CXR, EKG, and Cardizem with pt at bedside and pt agreed to plan.  I, Malvin Johns, MD, personally reviewed and evaluated these images and lab results as part of my medical decision-making.  Results for orders placed or performed during the hospital encounter of 81/27/51  Basic metabolic panel  Result Value Ref Range   Sodium 143 135 - 145 mmol/L   Potassium 3.5 3.5 - 5.1 mmol/L   Chloride 107 101 - 111 mmol/L   CO2 25 22 - 32 mmol/L   Glucose, Bld 168 (H) 65 - 99 mg/dL   BUN 20 6 - 20 mg/dL   Creatinine, Ser 1.07 (H) 0.44 - 1.00 mg/dL   Calcium 9.3 8.9 - 10.3 mg/dL   GFR calc non Af Amer 56 (L) >60 mL/min   GFR calc Af Amer >60 >60 mL/min   Anion gap 11 5 - 15  Brain natriuretic peptide  Result Value Ref Range   B Natriuretic Peptide 90.9 0.0 - 100.0 pg/mL  Troponin I  Result Value Ref Range   Troponin I <0.03 <0.031 ng/mL  CBC with Differential  Result Value Ref Range   WBC 8.2 4.0 - 10.5  K/uL   RBC 5.12 (H) 3.87 - 5.11 MIL/uL   Hemoglobin 15.2 (H) 12.0 - 15.0 g/dL   HCT 44.8 36.0 - 46.0 %   MCV 87.5 78.0 - 100.0 fL   MCH 29.7 26.0 - 34.0 pg   MCHC 33.9 30.0 - 36.0 g/dL   RDW 13.2 11.5 - 15.5 %   Platelets 160 150 - 400 K/uL   Neutrophils Relative % 69 43 - 77 %   Neutro Abs 5.7 1.7 - 7.7 K/uL   Lymphocytes Relative 21 12 - 46 %   Lymphs Abs 1.7 0.7 - 4.0 K/uL   Monocytes Relative 8 3 - 12 %   Monocytes Absolute 0.7 0.1 - 1.0 K/uL   Eosinophils Relative 2 0 - 5 %   Eosinophils Absolute 0.1 0.0 - 0.7 K/uL   Basophils Relative 0 0 - 1 %   Basophils Absolute 0.0 0.0 - 0.1 K/uL   Dg Chest Port 1 View  09/11/2014   CLINICAL DATA:  Acute onset of left-sided chest pain. Dizziness, left arm pain and left shoulder pain. Shortness of breath and palpitations. Nausea. Initial encounter.  EXAM: PORTABLE CHEST - 1 VIEW  COMPARISON:  Chest radiograph and CTA of the chest performed 06/19/2012  FINDINGS: The lungs are well-aerated. Vascular congestion is noted. Minimal left basilar atelectasis is seen. There is no evidence of pleural effusion or pneumothorax.  The cardiomediastinal silhouette is within normal limits. No acute osseous abnormalities are seen.  IMPRESSION: Vascular congestion noted.  Minimal left basilar atelectasis noted.   Electronically Signed   By: Garald Balding M.D.   On: 09/11/2014 19:33   Mm Screening Breast Tomo Bilateral  09/09/2014   CLINICAL DATA:  Screening.  EXAM: DIGITAL SCREENING BILATERAL MAMMOGRAM WITH 3D TOMO WITH CAD  COMPARISON:  Previous exam(s).  ACR Breast Density Category c: The breast tissue is heterogeneously dense, which may obscure small masses.  FINDINGS: There are no findings suspicious for malignancy. Images were processed with CAD.  IMPRESSION: No mammographic evidence of malignancy. A result letter of this screening mammogram will be mailed directly to the patient.  RECOMMENDATION: Screening mammogram in one year. (Code:SM-B-01Y)  BI-RADS  CATEGORY  1: Negative.   Electronically Signed   By: Lillia Mountain M.D.   On: 09/09/2014 11:56    Imaging Review Dg Chest Port 1 View  09/11/2014   CLINICAL DATA:  Acute onset of left-sided chest pain. Dizziness, left arm pain and left shoulder pain. Shortness of breath and palpitations. Nausea. Initial encounter.  EXAM: PORTABLE CHEST - 1 VIEW  COMPARISON:  Chest radiograph and CTA of the chest performed 06/19/2012  FINDINGS: The lungs are well-aerated. Vascular congestion is noted. Minimal left basilar atelectasis is seen. There is no evidence of pleural effusion or pneumothorax.  The cardiomediastinal silhouette is within normal limits. No acute osseous abnormalities are seen.  IMPRESSION: Vascular congestion noted.  Minimal left basilar atelectasis noted.   Electronically Signed   By: Garald Balding M.D.   On: 09/11/2014 19:33    EKG Interpretation  Date/Time:  Friday September 11 2014 20:24:22 EDT Ventricular Rate:  103 PR Interval:  150 QRS Duration: 80 QT Interval:  338 QTC Calculation: 442 R Axis:   71 Text Interpretation:  Sinus tachycardia Otherwise normal ECG Confirmed by Vianny Schraeder  MD, Atwell Mcdanel ([id number]) on 09/11/2014 8:36:21 PM       MDM   Final diagnoses:  Atrial fibrillation with rapid ventricular response  Chest pain, unspecified chest pain type   Patient was started on Cardizem drip after being given a Cardizem bolus for a total of 20 mg. Her heart rate did slow into the low 90s and low 100s. It then converted to a sinus rhythm. Her repeat EKG shows sinus tachycardia at a heart rate of 103 with no ST elevation. However she continued to complain of chest discomfort. She was given aspirin and nitroglycerin. Her pain completely went away after the nitroglycerin, 2 tablets. Given her risk factors with new onset atrial fibrillation, I will consult cardiology for admission.  Dr. Candyce Churn has accept pt for admission  I personally performed the services described in this documentation,  which was scribed in my presence.  The recorded information has been reviewed and considered.  CRITICAL CARE Performed by: Kaytie Ratcliffe Total critical care time: 45 Critical care time was exclusive of separately billable procedures and treating other patients. Critical care was necessary to treat or prevent imminent or life-threatening deterioration. Critical care was time spent personally by me on the following activities: development of treatment plan with patient and/or surrogate as well as nursing, discussions with consultants, evaluation of patient's response to treatment, examination of patient, obtaining history from patient or surrogate, ordering and performing treatments and interventions, ordering and review of laboratory studies, ordering and review of radiographic studies, pulse oximetry and re-evaluation of patient's condition.    Malvin Johns, MD 09/11/14 2051

## 2014-09-11 NOTE — ED Notes (Signed)
Patient in NSR. Reports chest pain.

## 2014-09-11 NOTE — ED Notes (Signed)
Report given to Meredith RN.

## 2014-09-11 NOTE — Telephone Encounter (Signed)
pre visit letter mailed 09/11/14

## 2014-09-12 ENCOUNTER — Other Ambulatory Visit: Payer: Self-pay | Admitting: Cardiology

## 2014-09-12 DIAGNOSIS — R0789 Other chest pain: Secondary | ICD-10-CM

## 2014-09-12 DIAGNOSIS — I480 Paroxysmal atrial fibrillation: Secondary | ICD-10-CM

## 2014-09-12 LAB — BASIC METABOLIC PANEL
Anion gap: 10 (ref 5–15)
BUN: 14 mg/dL (ref 6–20)
CHLORIDE: 105 mmol/L (ref 101–111)
CO2: 26 mmol/L (ref 22–32)
Calcium: 8.7 mg/dL — ABNORMAL LOW (ref 8.9–10.3)
Creatinine, Ser: 0.91 mg/dL (ref 0.44–1.00)
GFR calc non Af Amer: >60 mL/min (ref >60)
Glucose, Bld: 119 mg/dL — ABNORMAL HIGH (ref 65–99)
POTASSIUM: 3.6 mmol/L (ref 3.5–5.1)
SODIUM: 141 mmol/L (ref 135–145)

## 2014-09-12 LAB — CBC
HEMATOCRIT: 39.7 % (ref 36.0–46.0)
HEMOGLOBIN: 13.3 g/dL (ref 12.0–15.0)
MCH: 29.7 pg (ref 26.0–34.0)
MCHC: 33.5 g/dL (ref 30.0–36.0)
MCV: 88.6 fL (ref 78.0–100.0)
Platelets: 149 10*3/uL — ABNORMAL LOW (ref 150–400)
RBC: 4.48 MIL/uL (ref 3.87–5.11)
RDW: 13.4 % (ref 11.5–15.5)
WBC: 5.2 10*3/uL (ref 4.0–10.5)

## 2014-09-12 LAB — T4, FREE: FREE T4: 1.05 ng/dL (ref 0.61–1.12)

## 2014-09-12 LAB — LIPID PANEL
CHOLESTEROL: 169 mg/dL (ref 0–200)
HDL: 36 mg/dL — AB (ref >40)
LDL Cholesterol: 112 mg/dL — ABNORMAL HIGH (ref 0–99)
TRIGLYCERIDES: 107 mg/dL (ref <150)
Total CHOL/HDL Ratio: 4.7 RATIO
VLDL: 21 mg/dL (ref 0–40)

## 2014-09-12 LAB — TROPONIN I
Troponin I: <0.03 ng/mL (ref <0.031)
Troponin I: <0.03 ng/mL (ref <0.031)

## 2014-09-12 LAB — CK TOTAL AND CKMB (NOT AT ARMC)
CK TOTAL: 52 U/L (ref 38–234)
CK, MB: 1.5 ng/mL (ref 0.5–5.0)
Relative Index: INVALID (ref 0.0–2.5)

## 2014-09-12 LAB — TSH: TSH: 3.985 u[IU]/mL (ref 0.350–4.500)

## 2014-09-12 MED ORDER — OFF THE BEAT BOOK
Freq: Once | Status: AC
  Administered 2014-09-12: 12:00:00
  Filled 2014-09-12: qty 1

## 2014-09-12 MED ORDER — METOPROLOL SUCCINATE ER 25 MG PO TB24
25.0000 mg | ORAL_TABLET | Freq: Every day | ORAL | Status: DC
  Administered 2014-09-12: 25 mg via ORAL
  Filled 2014-09-12: qty 1

## 2014-09-12 MED ORDER — FUROSEMIDE 20 MG PO TABS
20.0000 mg | ORAL_TABLET | Freq: Every day | ORAL | Status: DC
  Administered 2014-09-12 – 2014-09-13 (×2): 20 mg via ORAL
  Filled 2014-09-12 (×2): qty 1

## 2014-09-12 MED ORDER — RIVAROXABAN 20 MG PO TABS
20.0000 mg | ORAL_TABLET | Freq: Every day | ORAL | Status: DC
  Administered 2014-09-12: 20 mg via ORAL
  Filled 2014-09-12: qty 1

## 2014-09-12 MED ORDER — ALBUTEROL SULFATE (2.5 MG/3ML) 0.083% IN NEBU: 2.5000 mg | INHALATION_SOLUTION | Freq: Four times a day (QID) | RESPIRATORY_TRACT | Status: DC | PRN

## 2014-09-12 MED ORDER — METOPROLOL SUCCINATE ER 25 MG PO TB24: 25.0000 mg | ORAL_TABLET | Freq: Every day | ORAL | Status: DC

## 2014-09-12 MED ORDER — PNEUMOCOCCAL VAC POLYVALENT 25 MCG/0.5ML IJ INJ
0.5000 mL | INJECTION | INTRAMUSCULAR | Status: AC
  Administered 2014-09-13: 0.5 mL via INTRAMUSCULAR
  Filled 2014-09-12: qty 0.5

## 2014-09-12 NOTE — Progress Notes (Signed)
ANTICOAGULATION CONSULT NOTE - Initial Consult  Pharmacy Consult for xarelto Indication: atrial fibrillation  Allergies  Allergen Reactions  . Lamictal [Lamotrigine]     Rash   . Eggs Or Egg-Derived Products Hives    Can eat foods with cooked eggs; CANNOT handle when part of flu vaccine, etc.     Patient Measurements: Height: 6' (182.9 cm) Weight: (!) 324 lb 8 oz (147.192 kg) IBW/kg (Calculated) : 73.1   Vital Signs: Temp: 98.2 F (36.8 C) (08/20 0500) Temp Source: Oral (08/20 0500) BP: 127/80 mmHg (08/20 1130) Pulse Rate: 76 (08/20 0500)  Labs:  Recent Labs  09/11/14 1908 09/11/14 2145 09/12/14 0305 09/12/14 1030  HGB 15.2* 15.0 13.3  --   HCT 44.8 44.9 39.7  --   PLT 160 175 149*  --   CREATININE 1.07* 0.99 0.91  --   CKTOTAL  --   --  52  --   CKMB  --   --  1.5  --   TROPONINI <0.03 <0.03 <0.03 <0.03    Estimated Creatinine Clearance: 109.3 mL/min (by C-G formula based on Cr of 0.91).   Medical History: Past Medical History  Diagnosis Date  . 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 Hypertension   . Depression   . Dyslipidemia   . Psychiatric hospitalization 05/2008  . Asthma   . Coronary artery disease   . Anxiety   . Morbid obesity with BMI of 40.0-44.9, adult   . Hyperlipidemia   . Knee pain, left   . PONV (postoperative nausea and vomiting)   . Lower extremity deep venous thrombosis     20 years ago   . CHF (congestive heart failure)     pt seen at heart/vascular spec clinic 08/14/2014   . Shortness of breath dyspnea     exercise   . Pneumonia     hx of   . Bronchitis     hx of   . Fall   . Urinary tract bacterial infections   . Urinary incontinence   . Cancer     basal cell carcinoma on right hand   . Anemia   . Shingles     20 years ago   . Measles     hx of in childhood     Assessment: 59 year old female with new onset afib. Patient not on anticoagulants prior to admission. Hx of CAD on  asa/plavis, these are being stopped and patient being transitioned to [place].  CT was negative for PE. CE's negative  Renal function is normal no dose adjustments needed.  Goal of Therapy:  Monitor platelets by anticoagulation protocol: Yes   Plan:  Xarelto 20mg  daily Follow cbc as needed Education completed  Erin Hearing PharmD., BCPS Clinical Pharmacist Pager [phone] 09/12/2014 11:44 AM

## 2014-09-12 NOTE — Progress Notes (Signed)
Error

## 2014-09-12 NOTE — Progress Notes (Signed)
Patient ID: Kathleen [name], female   DOB: [birth year], 59 y.o.   MRN: [id number]   SUBJECTIVE: Patient converted back to NSR last night, remains in NSR today.  No complaints.   Scheduled Meds: . divalproex  1,500 mg Oral QHS  . furosemide  20 mg Oral Daily  . lurasidone  40 mg Oral QHS  . metoprolol succinate  25 mg Oral Daily  . off the beat book   Does not apply Once  . OLANZapine  10 mg Oral QHS  . [START ON 09/13/2014] pneumococcal 23 valent vaccine  0.5 mL Intramuscular Tomorrow-1000  . simvastatin  20 mg Oral q1800   Continuous Infusions:  PRN Meds:.acetaminophen, albuterol, ALPRAZolam, HYDROcodone-acetaminophen, nitroGLYCERIN, ondansetron (ZOFRAN) IV    Filed Vitals:   09/11/14 2315 09/12/14 0055 09/12/14 0500 09/12/14 1130  BP: 111/59 128/92 95/60 127/80  Pulse: 87 90 76   Temp:  98.1 F (36.7 C) 98.2 F (36.8 C)   TempSrc:  Oral Oral   Resp: 19 18 18    Height:  6' (1.829 m)    Weight:  324 lb 8 oz (147.192 kg)    SpO2: 96% 97% 93%     Intake/Output Summary (Last 24 hours) at 09/12/14 1137 Last data filed at 09/12/14 2229  Gross per 24 hour  Intake    480 ml  Output      0 ml  Net    480 ml    LABS: Basic Metabolic Panel:  Recent Labs  09/11/14 1908 09/11/14 2145 09/12/14 0305  NA 143  --  141  K 3.5  --  3.6  CL 107  --  105  CO2 25  --  26  GLUCOSE 168*  --  119*  BUN 20  --  14  CREATININE 1.07* 0.99 0.91  CALCIUM 9.3  --  8.7*   Liver Function Tests: No results for input(s): AST, ALT, ALKPHOS, BILITOT, PROT, ALBUMIN in the last 72 hours. No results for input(s): LIPASE, AMYLASE in the last 72 hours. CBC:  Recent Labs  09/11/14 1908 09/11/14 2145 09/12/14 0305  WBC 8.2 8.1 5.2  NEUTROABS 5.7  --   --   HGB 15.2* 15.0 13.3  HCT 44.8 44.9 39.7  MCV 87.5 88.6 88.6  PLT 160 175 149*   Cardiac Enzymes:  Recent Labs  09/11/14 1908 09/11/14 2145 09/12/14 0305  CKTOTAL  --   --  52  CKMB  --   --  1.5  TROPONINI <0.03 <0.03 <0.03    BNP: Invalid input(s): POCBNP D-Dimer: No results for input(s): DDIMER in the last 72 hours. Hemoglobin A1C: No results for input(s): HGBA1C in the last 72 hours. Fasting Lipid Panel:  Recent Labs  09/12/14 0305  CHOL 169  HDL 36*  LDLCALC 112*  TRIG 107  CHOLHDL 4.7   Thyroid Function Tests:  Recent Labs  09/12/14 0305  TSH 3.985   Anemia Panel: No results for input(s): VITAMINB12, FOLATE, FERRITIN, TIBC, IRON, RETICCTPCT in the last 72 hours.  RADIOLOGY: Ct Angio Chest Pe W/cm &/or Wo Cm  09/11/2014   CLINICAL DATA:  Patient with left-sided chest pain and shortness of breath.  EXAM: CT ANGIOGRAPHY CHEST WITH CONTRAST  TECHNIQUE: Multidetector CT imaging of the chest was performed using the standard protocol during bolus administration of intravenous contrast. Multiplanar CT image reconstructions and MIPs were obtained to evaluate the vascular anatomy.  CONTRAST:  1110mL OMNIPAQUE IOHEXOL 350 MG/ML SOLN  COMPARISON:  PE study 06/11/2012  FINDINGS:  Mediastinum/Nodes: Heart is normal in size. No enlarged axillary, mediastinal or hilar lymphadenopathy. Examination for pulmonary embolism limited secondary to poor opacification of the pulmonary arteries. Within the above limitation no definite evidence for central, main, lobar or proximal segmental pulmonary embolus.  Lungs/Pleura: Central airways are patent. Atelectasis within the lingula. Additionally there is suggestion of an 11 mm nodular opacity within the lingula (image 22; series 5). No pleural effusion or pneumothorax.  Upper abdomen: Liver is diffusely low in attenuation compatible with hepatic steatosis. The adrenal glands are normal.  Musculoskeletal: No aggressive or acute appearing osseous lesions.  Review of the MIP images confirms the above findings.  IMPRESSION: Limited exam due to poor bolus within the pulmonary artery. Within the above limitation no evidence for central, main or lobar pulmonary embolism. If there is  persistent concern for more distal pulmonary emboli consider possible repeat evaluation or V/Q scan as clinically indicated. Additionally lower extremity Dopplers may prove helpful.  Suggestion of a 1.1 cm nodular opacity within the lingula which is favored to be secondary to scarring and associated atelectasis. A followup chest CT in 3 months is recommended to assess for stability.  Hepatic steatosis.   Electronically Signed   By: Lovey Newcomer M.D.   On: 09/11/2014 22:32   Dg Chest Port 1 View  09/11/2014   CLINICAL DATA:  Acute onset of left-sided chest pain. Dizziness, left arm pain and left shoulder pain. Shortness of breath and palpitations. Nausea. Initial encounter.  EXAM: PORTABLE CHEST - 1 VIEW  COMPARISON:  Chest radiograph and CTA of the chest performed 06/19/2012  FINDINGS: The lungs are well-aerated. Vascular congestion is noted. Minimal left basilar atelectasis is seen. There is no evidence of pleural effusion or pneumothorax.  The cardiomediastinal silhouette is within normal limits. No acute osseous abnormalities are seen.  IMPRESSION: Vascular congestion noted.  Minimal left basilar atelectasis noted.   Electronically Signed   By: Garald Balding M.D.   On: 09/11/2014 19:33   Mm Screening Breast Tomo Bilateral  09/09/2014   CLINICAL DATA:  Screening.  EXAM: DIGITAL SCREENING BILATERAL MAMMOGRAM WITH 3D TOMO WITH CAD  COMPARISON:  Previous exam(s).  ACR Breast Density Category c: The breast tissue is heterogeneously dense, which may obscure small masses.  FINDINGS: There are no findings suspicious for malignancy. Images were processed with CAD.  IMPRESSION: No mammographic evidence of malignancy. A result letter of this screening mammogram will be mailed directly to the patient.  RECOMMENDATION: Screening mammogram in one year. (Code:SM-B-01Y)  BI-RADS CATEGORY  1: Negative.   Electronically Signed   By: Lillia Mountain M.D.   On: 09/09/2014 11:56    PHYSICAL EXAM General: NAD Neck: No JVD, no  thyromegaly or thyroid nodule.  Lungs: Clear to auscultation bilaterally with normal respiratory effort. CV: Nondisplaced PMI.  Heart regular S1/S2, no S3/S4, no murmur.  No peripheral edema.  No carotid bruit.  Normal pedal pulses.  Abdomen: Soft, nontender, no hepatosplenomegaly, no distention.  Neurologic: Alert and oriented x 3.  Psych: Normal affect. Extremities: No clubbing or cyanosis.   TELEMETRY: Reviewed telemetry pt in NSR  ASSESSMENT AND PLAN: 59 yo with history of CAD, HTN, hyperlipidemia had parathyroidectomy a couple of weeks ago and now presents with tachypalpitations and chest pain.  Found to be in atrial fibrillation with RVR.  1. Atrial fibrillation: Now back in NSR.  Paroxysmal.  Newly-noted.  CHADSVASC = 3.  - Will start Toprol XL 25 mg daily.  - Start Xarelto 20 mg  daily.  2. Chest pain: Negative troponin.  PE CT was difficult study but no PE noted.  Suspect related to tachyarrhythmia.   - Will get echo.  3. CAD: NSTEMI years ago with occluded septal perforator.  No stents.  Since we are starting Xarelto, will stop ASA and Plavix.  4. Monitor overnight, probably home in am.   Loralie Champagne 09/12/2014 11:41 AM

## 2014-09-13 ENCOUNTER — Inpatient Hospital Stay (HOSPITAL_COMMUNITY): Payer: BC Managed Care – PPO

## 2014-09-13 DIAGNOSIS — R079 Chest pain, unspecified: Secondary | ICD-10-CM

## 2014-09-13 LAB — CBC
HCT: 40.1 % (ref 36.0–46.0)
HEMOGLOBIN: 13.5 g/dL (ref 12.0–15.0)
MCH: 29.7 pg (ref 26.0–34.0)
MCHC: 33.7 g/dL (ref 30.0–36.0)
MCV: 88.3 fL (ref 78.0–100.0)
PLATELETS: 126 10*3/uL — AB (ref 150–400)
RBC: 4.54 MIL/uL (ref 3.87–5.11)
RDW: 13.3 % (ref 11.5–15.5)
WBC: 4.6 10*3/uL (ref 4.0–10.5)

## 2014-09-13 LAB — BASIC METABOLIC PANEL
ANION GAP: 9 (ref 5–15)
BUN: 12 mg/dL (ref 6–20)
CHLORIDE: 103 mmol/L (ref 101–111)
CO2: 28 mmol/L (ref 22–32)
CREATININE: 0.9 mg/dL (ref 0.44–1.00)
Calcium: 8.4 mg/dL — ABNORMAL LOW (ref 8.9–10.3)
GFR calc non Af Amer: >60 mL/min (ref >60)
Glucose, Bld: 99 mg/dL (ref 65–99)
POTASSIUM: 4 mmol/L (ref 3.5–5.1)
SODIUM: 140 mmol/L (ref 135–145)

## 2014-09-13 MED ORDER — RIVAROXABAN 20 MG PO TABS
20.0000 mg | ORAL_TABLET | Freq: Once | ORAL | Status: AC
  Administered 2014-09-13: 20 mg via ORAL
  Filled 2014-09-13: qty 1

## 2014-09-13 MED ORDER — LISINOPRIL 10 MG PO TABS
10.0000 mg | ORAL_TABLET | Freq: Every day | ORAL | Status: DC
  Administered 2014-09-13: 10 mg via ORAL
  Filled 2014-09-13: qty 1

## 2014-09-13 MED ORDER — METOPROLOL SUCCINATE ER 25 MG PO TB24: 25.0000 mg | ORAL_TABLET | Freq: Every day | ORAL | Status: DC

## 2014-09-13 MED ORDER — RIVAROXABAN 20 MG PO TABS: 20.0000 mg | ORAL_TABLET | Freq: Every day | ORAL | Status: DC

## 2014-09-13 NOTE — Progress Notes (Signed)
Patient ID: Kathleen [name], female   DOB: 1955/09/29, 59 y.o.   MRN: [id number]   SUBJECTIVE: Patient remains in NSR.  No problems overnight.    Scheduled Meds: . divalproex  1,500 mg Oral QHS  . furosemide  20 mg Oral Daily  . lisinopril  10 mg Oral Daily  . lurasidone  40 mg Oral QHS  . metoprolol succinate  25 mg Oral QHS  . OLANZapine  10 mg Oral QHS  . rivaroxaban  20 mg Oral Q supper  . simvastatin  20 mg Oral q1800   Continuous Infusions:  PRN Meds:.acetaminophen, albuterol, ALPRAZolam, HYDROcodone-acetaminophen, nitroGLYCERIN, ondansetron (ZOFRAN) IV    Filed Vitals:   09/12/14 1323 09/12/14 2051 09/13/14 0435 09/13/14 1030  BP: 123/71 118/68 123/64   Pulse: 78 82    Temp: 98.5 F (36.9 C) 98.4 F (36.9 C) 97.9 F (36.6 C)   TempSrc: Oral Oral Oral   Resp: 19 18 18    Height:      Weight:    325 lb 9.6 oz (147.691 kg)  SpO2: 95% 93% 92%     Intake/Output Summary (Last 24 hours) at 09/13/14 1129 Last data filed at 09/13/14 1109  Gross per 24 hour  Intake    720 ml  Output    575 ml  Net    145 ml    LABS: Basic Metabolic Panel:  Recent Labs  09/12/14 0305 09/13/14 0350  NA 141 140  K 3.6 4.0  CL 105 103  CO2 26 28  GLUCOSE 119* 99  BUN 14 12  CREATININE 0.91 0.90  CALCIUM 8.7* 8.4*   Liver Function Tests: No results for input(s): AST, ALT, ALKPHOS, BILITOT, PROT, ALBUMIN in the last 72 hours. No results for input(s): LIPASE, AMYLASE in the last 72 hours. CBC:  Recent Labs  09/11/14 1908  09/12/14 0305 09/13/14 0350  WBC 8.2  < > 5.2 4.6  NEUTROABS 5.7  --   --   --   HGB 15.2*  < > 13.3 13.5  HCT 44.8  < > 39.7 40.1  MCV 87.5  < > 88.6 88.3  PLT 160  < > 149* 126*  < > = values in this interval not displayed. Cardiac Enzymes:  Recent Labs  09/11/14 2145 09/12/14 0305 09/12/14 1030  CKTOTAL  --  52  --   CKMB  --  1.5  --   TROPONINI <0.03 <0.03 <0.03   BNP: Invalid input(s): POCBNP D-Dimer: No results for input(s): DDIMER in  the last 72 hours. Hemoglobin A1C: No results for input(s): HGBA1C in the last 72 hours. Fasting Lipid Panel:  Recent Labs  09/12/14 0305  CHOL 169  HDL 36*  LDLCALC 112*  TRIG 107  CHOLHDL 4.7   Thyroid Function Tests:  Recent Labs  09/12/14 0305  TSH 3.985   Anemia Panel: No results for input(s): VITAMINB12, FOLATE, FERRITIN, TIBC, IRON, RETICCTPCT in the last 72 hours.  RADIOLOGY: Ct Angio Chest Pe W/cm &/or Wo Cm  09/11/2014   CLINICAL DATA:  Patient with left-sided chest pain and shortness of breath.  EXAM: CT ANGIOGRAPHY CHEST WITH CONTRAST  TECHNIQUE: Multidetector CT imaging of the chest was performed using the standard protocol during bolus administration of intravenous contrast. Multiplanar CT image reconstructions and MIPs were obtained to evaluate the vascular anatomy.  CONTRAST:  161mL OMNIPAQUE IOHEXOL 350 MG/ML SOLN  COMPARISON:  PE study 06/11/2012  FINDINGS: Mediastinum/Nodes: Heart is normal in size. No enlarged axillary, mediastinal or hilar  lymphadenopathy. Examination for pulmonary embolism limited secondary to poor opacification of the pulmonary arteries. Within the above limitation no definite evidence for central, main, lobar or proximal segmental pulmonary embolus.  Lungs/Pleura: Central airways are patent. Atelectasis within the lingula. Additionally there is suggestion of an 11 mm nodular opacity within the lingula (image 22; series 5). No pleural effusion or pneumothorax.  Upper abdomen: Liver is diffusely low in attenuation compatible with hepatic steatosis. The adrenal glands are normal.  Musculoskeletal: No aggressive or acute appearing osseous lesions.  Review of the MIP images confirms the above findings.  IMPRESSION: Limited exam due to poor bolus within the pulmonary artery. Within the above limitation no evidence for central, main or lobar pulmonary embolism. If there is persistent concern for more distal pulmonary emboli consider possible repeat  evaluation or V/Q scan as clinically indicated. Additionally lower extremity Dopplers may prove helpful.  Suggestion of a 1.1 cm nodular opacity within the lingula which is favored to be secondary to scarring and associated atelectasis. A followup chest CT in 3 months is recommended to assess for stability.  Hepatic steatosis.   Electronically Signed   By: Lovey Newcomer M.D.   On: 09/11/2014 22:32   Dg Chest Port 1 View  09/11/2014   CLINICAL DATA:  Acute onset of left-sided chest pain. Dizziness, left arm pain and left shoulder pain. Shortness of breath and palpitations. Nausea. Initial encounter.  EXAM: PORTABLE CHEST - 1 VIEW  COMPARISON:  Chest radiograph and CTA of the chest performed 06/19/2012  FINDINGS: The lungs are well-aerated. Vascular congestion is noted. Minimal left basilar atelectasis is seen. There is no evidence of pleural effusion or pneumothorax.  The cardiomediastinal silhouette is within normal limits. No acute osseous abnormalities are seen.  IMPRESSION: Vascular congestion noted.  Minimal left basilar atelectasis noted.   Electronically Signed   By: Garald Balding M.D.   On: 09/11/2014 19:33   Mm Screening Breast Tomo Bilateral  09/09/2014   CLINICAL DATA:  Screening.  EXAM: DIGITAL SCREENING BILATERAL MAMMOGRAM WITH 3D TOMO WITH CAD  COMPARISON:  Previous exam(s).  ACR Breast Density Category c: The breast tissue is heterogeneously dense, which may obscure small masses.  FINDINGS: There are no findings suspicious for malignancy. Images were processed with CAD.  IMPRESSION: No mammographic evidence of malignancy. A result letter of this screening mammogram will be mailed directly to the patient.  RECOMMENDATION: Screening mammogram in one year. (Code:[id number]-B-01Y)  BI-RADS CATEGORY  1: Negative.   Electronically Signed   By: Lillia Mountain M.D.   On: 09/09/2014 11:56    PHYSICAL EXAM General: NAD Neck: No JVD, no thyromegaly or thyroid nodule.  Lungs: Clear to auscultation bilaterally with  normal respiratory effort. CV: Nondisplaced PMI.  Heart regular S1/S2, no S3/S4, no murmur.  No peripheral edema.  No carotid bruit.  Normal pedal pulses.  Abdomen: Soft, nontender, no hepatosplenomegaly, no distention.  Neurologic: Alert and oriented x 3.  Psych: Normal affect. Extremities: No clubbing or cyanosis.   TELEMETRY: Reviewed telemetry pt in NSR  ASSESSMENT AND PLAN: 59 yo with history of CAD, HTN, hyperlipidemia had parathyroidectomy a couple of weeks ago and now presents with tachypalpitations and chest pain.  Found to be in atrial fibrillation with RVR.  1. Atrial fibrillation: Now back in NSR.  Paroxysmal.  Newly-noted.  CHADSVASC = 3.  Echo today with normal EF, mild to moderate LAE.  - Continue Toprol XL.  - Continue Xarelto 20 mg daily.  2. Chest pain: Negative  troponin.  PE CT was difficult study but no PE noted.  Suspect related to tachyarrhythmia.   3. CAD: NSTEMI years ago with occluded septal perforator.  No stents.  Since we are starting Xarelto, will stop ASA and Plavix.  4. Disposition: Ok for home today.  Needs cardiology followup 2 wks.  Cardiac meds: Xarelto 20 daily, Toprol XL 25 daily, Lasix 20 daily, Zocor 20 daily, lisinopril 10 daily.   Loralie Champagne 09/13/2014 11:29 AM

## 2014-09-13 NOTE — Progress Notes (Signed)
All discharge paperwork gone over with pt. Pt verbalized understanding. Pt questions answered. Pt notified to call unit with any discharge questions. VSS, and IV removed.

## 2014-09-13 NOTE — Discharge Summary (Signed)
Discharge Summary   Patient ID: Kathleen [name],  MRN: [id number], DOB/AGE: [birth year] 58 y.o.  Admit date: 09/11/2014 Discharge date: 09/13/2014  Primary Care Provider: Annye Asa Primary Cardiologist: Dr. [name]  Discharge Diagnoses Principal Problem:   Atrial fibrillation Active Problems:   Hyperlipidemia   Obesity   HYPERTENSION, BENIGN ESSENTIAL   Allergies Allergies  Allergen Reactions  . Lamictal [Lamotrigine]     Rash   . Eggs Or Egg-Derived Products Hives    Can eat foods with cooked eggs; CANNOT handle when part of flu vaccine, etc.     Procedures  Echocardiogram 09/13/2014 LV EF: 55% -  60%  ------------------------------------------------------------------- Indications:   Chest pain 786.51.  ------------------------------------------------------------------- History:  PMH: Hyperparathyroidism. Dyspnea. Acute myocardial infarction. Coronary artery disease. PMH:  Myocardial infarction. Risk factors: Hypertension.  ------------------------------------------------------------------- Study Conclusions  - Left ventricle: The cavity size was normal. Wall thickness was normal. Systolic function was normal. The estimated ejection fraction was in the range of 55% to 60%. Wall motion was normal; there were no regional wall motion abnormalities. Doppler parameters are consistent with abnormal left ventricular relaxation (grade 1 diastolic dysfunction). - Aortic valve: There was no stenosis. There was trivial regurgitation. - Mitral valve: There was trivial regurgitation. - Left atrium: The atrium was mildly to moderately dilated. - Right ventricle: The cavity size was normal. Systolic function was normal. - Tricuspid valve: Peak RV-RA gradient (S): 29 mm Hg. - Pulmonary arteries: PA peak pressure: 32 mm Hg (S). - Inferior vena cava: The vessel was normal in size. The respirophasic diameter changes were in the normal range  (>= 50%), consistent with normal central venous pressure.  Impressions:  - Normal LV size with EF 55-60%. Normal RV size and systolic function. Mild to moderate LAE. No significant valvular abnormalities.    CTA of chest 09/11/2014  IMPRESSION: Limited exam due to poor bolus within the pulmonary artery. Within the above limitation no evidence for central, main or lobar pulmonary embolism. If there is persistent concern for more distal pulmonary emboli consider possible repeat evaluation or V/Q scan as clinically indicated. Additionally lower extremity Dopplers may prove helpful.  Suggestion of a 1.1 cm nodular opacity within the lingula which is favored to be secondary to scarring and associated atelectasis. A followup chest CT in 3 months is recommended to assess for stability.  Hepatic steatosis.    Hospital Course  The patient is a 59 year old female with past medical history of CAD s/p NSTEMI in 2009 with occluded septal perforator (no stent), hypertension, hyperlipidemia, morbid obesity, and prior history of DVT who was recently admitted for parthyroidectomy now present to urgent care with palpitation and chest pain. Around 2 PM on the day of admission, she noted her heart was racing and developed substernal chest discomfort. She also endorses associated diaphoresis, nausea and shortness breath. On presentation to the ED, she was found to be in atrial fibrillation with RVR which is new for her. Diltiazem drip was started and she converted to sinus tach. She was still having chest discomfort, therefore received a full dose aspirin and 2 sublingual nitroglycerin. EKG showed normal sinus rhythm without significant ST-T wave changes to suggest ischemia. Troponin was negative. Cardiology was consulted for admission.  She was admitted to cardiology service. Given CHADSVASC = 3, she was started on  Xarelto 20 mg daily. Diltiazem was transitioned to Toprol-XL 20 mg daily. CT of the  chest was negative for PE. Was suspected the chest discomfort was related to tachyarrhythmia. Give the  fact that NSTEMI occurred in 2009, and the fact that we are starting Xarelto, her aspirin and Plavix were discontinued. Her TSH and free T4 were normal. Echocardiogram was obtained on 09/13/2014 which showed EF 37-62%, grade 1 diastolic dysfunction, no significant valvular abnormalities, mild-to-moderate left atrial enlargement.   She was seen in the morning of 09/13/2014, at which time she was maintaining sinus rhythm. She is deemed stable for discharge from cardiology perspective. I will arrange 2 weeks follow-up. Of note, her CT showed 1.1 cm nodular opacity in the lingula favoring secondary to scarring and associated atelectasis, a follow-up CT was recommended in 3 month to suggest stability. I have informed the patient CT finding and instructed her to follow-up with her PCP.   Discharge Vitals Blood pressure 123/64, pulse 82, temperature 97.9 F (36.6 C), temperature source Oral, resp. rate 18, height 6' (1.829 m), weight 325 lb 9.6 oz (147.691 kg), SpO2 92 %.  Filed Weights   09/11/14 1900 09/12/14 0055 09/13/14 1030  Weight: 326 lb (147.873 kg) 324 lb 8 oz (147.192 kg) 325 lb 9.6 oz (147.691 kg)    Labs  CBC  Recent Labs  09/11/14 1908  09/12/14 0305 09/13/14 0350  WBC 8.2  < > 5.2 4.6  NEUTROABS 5.7  --   --   --   HGB 15.2*  < > 13.3 13.5  HCT 44.8  < > 39.7 40.1  MCV 87.5  < > 88.6 88.3  PLT 160  < > 149* 126*  < > = values in this interval not displayed. Basic Metabolic Panel  Recent Labs  09/12/14 0305 09/13/14 0350  NA 141 140  K 3.6 4.0  CL 105 103  CO2 26 28  GLUCOSE 119* 99  BUN 14 12  CREATININE 0.91 0.90  CALCIUM 8.7* 8.4*   Cardiac Enzymes  Recent Labs  09/11/14 2145 09/12/14 0305 09/12/14 1030  CKTOTAL  --  52  --   CKMB  --  1.5  --   TROPONINI <0.03 <0.03 <0.03   Fasting Lipid Panel  Recent Labs  09/12/14 0305  CHOL 169  HDL 36*    LDLCALC 112*  TRIG 107  CHOLHDL 4.7   Thyroid Function Tests  Recent Labs  09/12/14 0305  TSH 3.985    Disposition  Pt is being discharged home today in good condition.  Follow-up Plans & Appointments      Follow-up Information    Follow up with [facility].   Specialty:  Cardiology   Why:  Office scheduler will contact you to arrange 2 weeks followup, please give [place] a call if you do not hear from [place] in 2 days   Contact information:   [address], [place] [phone]      Follow up with Annye Asa, MD On 10/02/2014.   Specialty:  Family Medicine   Why:  3:00pm. Please followup with primary care provider regarding abnormal CT scan of chest   Contact information:   [address] [place] [address] [phone]       Follow up with Philemon Kingdom, MD On 10/30/2014.   Specialty:  Internal Medicine   Why:  4:30pm. Endocrinology followup   Contact information:   [address] [place] [address] [phone]       Discharge Medications    Medication List    STOP taking these medications  aspirin 81 MG tablet     clopidogrel 75 MG tablet  Commonly known as:  PLAVIX     HYDROcodone-acetaminophen 5-325 MG per tablet  Commonly known as:  NORCO/VICODIN      TAKE these medications        albuterol 108 (90 BASE) MCG/ACT inhaler  Commonly known as:  PROVENTIL HFA;VENTOLIN HFA  Inhale 2 puffs into the lungs every 6 (six) hours as needed for wheezing.     ALPRAZolam 0.5 MG tablet  Commonly known as:  XANAX  Take 0.5 mg by mouth 2 (two) times daily as needed for anxiety.     Cranberry-Cholecalciferol 4200-500 MG-UNIT Caps  Take 1 capsule by mouth at bedtime.     divalproex 500 MG 24 hr tablet  Commonly known as:  DEPAKOTE ER  Take 1,500 mg by mouth at bedtime.     furosemide 20 MG tablet  Commonly known as:  LASIX  Take 20 mg by mouth  daily.     lisinopril 10 MG tablet  Commonly known as:  PRINIVIL,ZESTRIL  Take 1 tablet (10 mg total) by mouth daily. NEEDS APPOINTMENT WITH DR [place]ique FOR FUTURE REFILLS     lurasidone 40 MG Tabs tablet  Commonly known as:  LATUDA  Take 40 mg by mouth at bedtime.     metoprolol succinate 25 MG 24 hr tablet  Commonly known as:  TOPROL-XL  Take 1 tablet (25 mg total) by mouth at bedtime.     nitroGLYCERIN 0.4 MG SL tablet  Commonly known as:  NITROSTAT  Place 1 tablet (0.4 mg total) under the tongue every 5 (five) minutes as needed for chest pain. For chest pain     OLANZapine 10 MG tablet  Commonly known as:  ZYPREXA  Take 10 mg by mouth at bedtime.     potassium chloride SA 20 MEQ tablet  Commonly known as:  K-DUR,KLOR-CON  Take 20 mEq by mouth daily.     rivaroxaban 20 MG Tabs tablet  Commonly known as:  XARELTO  Take 1 tablet (20 mg total) by mouth daily with supper.  Start taking on:  09/14/2014     simvastatin 20 MG tablet  Commonly known as:  ZOCOR  TAKE 1 TABLET (20 MG TOTAL) BY MOUTH AT BEDTIME.     SUPER B COMPLEX/C PO  Take 1 capsule by mouth daily.     Vitamin D3 5000 UNITS Caps  Take 5,000 Units by mouth daily.        Duration of Discharge Encounter   Greater than 30 minutes including physician time.  Hilbert Corrigan PA-C Pager: [phone] 09/13/2014, 1:03 PM

## 2014-09-13 NOTE — Progress Notes (Signed)
  Echocardiogram 2D Echocardiogram has been performed.  Donata Clay 09/13/2014, 10:20 AM

## 2014-09-13 NOTE — Discharge Instructions (Addendum)
Information on my medicine - XARELTO (Rivaroxaban)  This medication education was reviewed with me or my healthcare representative as part of my discharge preparation.  The pharmacist that spoke with me during my hospital stay was:  Georgina Peer, [facility]  Why was Xarelto prescribed for you? Xarelto was prescribed for you to reduce the risk of a blood clot forming that can cause a stroke if you have a medical condition called atrial fibrillation (a type of irregular heartbeat).  What do you need to know about xarelto ? Take your Xarelto ONCE DAILY at the same time every day with your evening meal. If you have difficulty swallowing the tablet whole, you may crush it and mix in applesauce just prior to taking your dose.  Take Xarelto exactly as prescribed by your doctor and DO NOT stop taking Xarelto without talking to the doctor who prescribed the medication.  Stopping without other stroke prevention medication to take the place of Xarelto may increase your risk of developing a clot that causes a stroke.  Refill your prescription before you run out.  After discharge, you should have regular check-up appointments with your healthcare provider that is prescribing your Xarelto.  In the future your dose may need to be changed if your kidney function or weight changes by a significant amount.  What do you do if you miss a dose? If you are taking Xarelto ONCE DAILY and you miss a dose, take it as soon as you remember on the same day then continue your regularly scheduled once daily regimen the next day. Do not take two doses of Xarelto at the same time or on the same day.   Important Safety Information A possible side effect of Xarelto is bleeding. You should call your healthcare provider right away if you experience any of the following: ? Bleeding from an injury or your nose that does not stop. ? Unusual colored urine (red or dark brown) or unusual colored stools (red or  black). ? Unusual bruising for unknown reasons. ? A serious fall or if you hit your head (even if there is no bleeding).  Some medicines may interact with Xarelto and might increase your risk of bleeding while on Xarelto. To help avoid this, consult your healthcare provider or pharmacist prior to using any new prescription or non-prescription medications, including herbals, vitamins, non-steroidal anti-inflammatory drugs (NSAIDs) and supplements.  This website has more information on Xarelto: [email or web address]   Atrial Fibrillation Atrial fibrillation is a condition that causes your heart to beat irregularly. It may also cause your heart to beat faster than normal. Atrial fibrillation can prevent your heart from pumping blood normally. It increases your risk of stroke and heart problems. HOME CARE  Take medications as told by your doctor.  Only take medications that your doctor says are safe. Some medications can make the condition worse or happen again.  If blood thinners were prescribed by your doctor, take them exactly as told. Too much can cause bleeding. Too little and you will not have the needed protection against stroke and other problems.  Perform blood tests at home if told by your doctor.  Perform blood tests exactly as told by your doctor.  Do not drink alcohol.  Do not drink beverages with caffeine such as coffee, soda, and some teas.  Maintain a healthy weight.  Do not use diet pills unless your doctor says they are safe. They may make heart problems worse.  Follow diet instructions as told by your  doctor.  Exercise regularly as told by your doctor.  Keep all follow-up appointments. GET HELP IF:  You notice a change in the speed, rhythm, or strength of your heartbeat.  You suddenly begin peeing (urinating) more often.  You get tired more easily when moving or exercising. GET HELP RIGHT AWAY IF:   You have chest or belly (abdominal) pain.  You feel sick  to your stomach (nauseous).  You are short of breath.  You suddenly have swollen feet and ankles.  You feel dizzy.  You face, arms, or legs feel numb or weak.  There is a change in your vision or speech. MAKE SURE YOU:   Understand these instructions.  Will watch your condition.  Will get help right away if you are not doing well or get worse. Document Released: 10/19/2007 Document Revised: 05/26/2013 Document Reviewed: 02/20/2012 [facility] Patient Information 2015 [employer], [place]. This information is not intended to replace advice given to you by your health care provider. Make sure you discuss any questions you have with your health care provider. Stroke Prevention Some medical conditions and behaviors are associated with an increased chance of having a stroke. You may prevent a stroke by making healthy choices and managing medical conditions. HOW CAN I REDUCE MY RISK OF HAVING A STROKE?   Stay physically active. Get at least 30 minutes of activity on most or all days.  Do not smoke. It may also be helpful to avoid exposure to secondhand smoke.  Limit alcohol use. Moderate alcohol use is considered to be:  No more than 2 drinks per day for men.  No more than 1 drink per day for nonpregnant women.  Eat healthy foods. This involves:  Eating 5 or more servings of fruits and vegetables a day.  Making dietary changes that address high blood pressure (hypertension), high cholesterol, diabetes, or obesity.  Manage your cholesterol levels.  Making food choices that are high in fiber and low in saturated fat, trans fat, and cholesterol may control cholesterol levels.  Take any prescribed medicines to control cholesterol as directed by your health care provider.  Manage your diabetes.  Controlling your carbohydrate and sugar intake is recommended to manage diabetes.  Take any prescribed medicines to control diabetes as directed by your health care provider.  Control your  hypertension.  Making food choices that are low in salt (sodium), saturated fat, trans fat, and cholesterol is recommended to manage hypertension.  Take any prescribed medicines to control hypertension as directed by your health care provider.  Maintain a healthy weight.  Reducing calorie intake and making food choices that are low in sodium, saturated fat, trans fat, and cholesterol are recommended to manage weight.  Stop drug abuse.  Avoid taking birth control pills.  Talk to your health care provider about the risks of taking birth control pills if you are over 68 years old, smoke, get migraines, or have ever had a blood clot.  Get evaluated for sleep disorders (sleep apnea).  Talk to your health care provider about getting a sleep evaluation if you snore a lot or have excessive sleepiness.  Take medicines only as directed by your health care provider.  For some people, aspirin or blood thinners (anticoagulants) are helpful in reducing the risk of forming abnormal blood clots that can lead to stroke. If you have the irregular heart rhythm of atrial fibrillation, you should be on a blood thinner unless there is a good reason you cannot take them.  Understand all your  medicine instructions.  Make sure that other conditions (such as anemia or atherosclerosis) are addressed. SEEK IMMEDIATE MEDICAL CARE IF:   You have sudden weakness or numbness of the face, arm, or leg, especially on one side of the body.  Your face or eyelid droops to one side.  You have sudden confusion.  You have trouble speaking (aphasia) or understanding.  You have sudden trouble seeing in one or both eyes.  You have sudden trouble walking.  You have dizziness.  You have a loss of balance or coordination.  You have a sudden, severe headache with no known cause.  You have new chest pain or an irregular heartbeat. Any of these symptoms may represent a serious problem that is an emergency. Do not wait  to see if the symptoms will go away. Get medical help at once. Call your local emergency services (911 in [place] Do not drive yourself to the hospital. Document Released: 02/17/2004 Document Revised: 05/26/2013 Document Reviewed: 07/12/2012 [facility] Patient Information 2015 [place], [place]. This information is not intended to replace advice given to you by your health care provider. Make sure you discuss any questions you have with your health care provider.

## 2014-09-13 NOTE — Progress Notes (Signed)
Utilization Review Completed.Kathleen [name] T8/21/2016  

## 2014-09-13 NOTE — Progress Notes (Signed)
CM called per nurse to facilitate pt with Xarelto card. CM spoke with pt regarding Xarelto and provided pt with 30 day free and copay card. Pt verbally stated understanding of card usage for both. No other needs identified per CM. Whitman Hero RN,BSN, [place]

## 2014-09-14 ENCOUNTER — Telehealth: Payer: Self-pay | Admitting: Behavioral Health

## 2014-09-14 LAB — HEMOGLOBIN A1C
Hgb A1c MFr Bld: 6 % — ABNORMAL HIGH (ref 4.8–5.6)
Mean Plasma Glucose: 126 mg/dL

## 2014-09-14 NOTE — Telephone Encounter (Signed)
Transition Care Management Follow-up Telephone Call   Date discharged? 09/13/14   How have you been since you were released from the hospital? Patient reports "feeling very sleepy" since she was unable to get good rest in the hospital.   Do you understand why you were in the hospital? yes, patient voiced that she had a-fib and that her heart was beating too fast.   Do you understand the discharge instructions? yes   Where were you discharged to? home   Items Reviewed:  Medications reviewed: yes  Allergies reviewed: yes  Dietary changes reviewed: yes; per the patient, it was recommended that she loses 100 lbs.  Referrals reviewed: no   Functional Questionnaire:   Activities of Daily Living (ADLs):   She states they are independent in the following: ambulation, bathing and hygiene, feeding, continence, grooming, toileting and dressing States they require assistance with the following: None   Any transportation issues/concerns?: no   Any patient concerns? yes, patient verbalized that she's concern about being able to continue in her water aerobic activity.   Confirmed importance and date/time of follow-up visits scheduled Patient declined hospital follow-up visit, however she will still keep her appointment for 10/02/14 to have a physical completed with Dr. [name].   Confirmed with patient if condition begins to worsen call PCP or go to the ER.  Patient was given the office number and encouraged to call back with question or concerns.  : yes

## 2014-09-15 ENCOUNTER — Emergency Department (HOSPITAL_COMMUNITY)
Admission: EM | Admit: 2014-09-15 | Discharge: 2014-09-15 | Disposition: A | Discharge status: Home / Self Care | Payer: Worker's Compensation | Attending: Emergency Medicine | Admitting: Emergency Medicine

## 2014-09-15 ENCOUNTER — Emergency Department (HOSPITAL_COMMUNITY): Payer: Worker's Compensation

## 2014-09-15 ENCOUNTER — Encounter (HOSPITAL_COMMUNITY): Payer: Self-pay | Admitting: Emergency Medicine

## 2014-09-15 DIAGNOSIS — Y92480 Sidewalk as the place of occurrence of the external cause: Secondary | ICD-10-CM | POA: Insufficient documentation

## 2014-09-15 DIAGNOSIS — W010XXA Fall on same level from slipping, tripping and stumbling without subsequent striking against object, initial encounter: Secondary | ICD-10-CM | POA: Insufficient documentation

## 2014-09-15 DIAGNOSIS — Z79899 Other long term (current) drug therapy: Secondary | ICD-10-CM | POA: Insufficient documentation

## 2014-09-15 DIAGNOSIS — Z86718 Personal history of other venous thrombosis and embolism: Secondary | ICD-10-CM | POA: Insufficient documentation

## 2014-09-15 DIAGNOSIS — S8002XA Contusion of left knee, initial encounter: Secondary | ICD-10-CM | POA: Diagnosis not present

## 2014-09-15 DIAGNOSIS — Z9889 Other specified postprocedural states: Secondary | ICD-10-CM | POA: Diagnosis not present

## 2014-09-15 DIAGNOSIS — E785 Hyperlipidemia, unspecified: Secondary | ICD-10-CM | POA: Diagnosis not present

## 2014-09-15 DIAGNOSIS — M25562 Pain in left knee: Secondary | ICD-10-CM

## 2014-09-15 DIAGNOSIS — Z8619 Personal history of other infectious and parasitic diseases: Secondary | ICD-10-CM | POA: Insufficient documentation

## 2014-09-15 DIAGNOSIS — Z87440 Personal history of urinary (tract) infections: Secondary | ICD-10-CM | POA: Diagnosis not present

## 2014-09-15 DIAGNOSIS — I509 Heart failure, unspecified: Secondary | ICD-10-CM | POA: Diagnosis not present

## 2014-09-15 DIAGNOSIS — I10 Essential (primary) hypertension: Secondary | ICD-10-CM | POA: Diagnosis not present

## 2014-09-15 DIAGNOSIS — F329 Major depressive disorder, single episode, unspecified: Secondary | ICD-10-CM | POA: Diagnosis not present

## 2014-09-15 DIAGNOSIS — Z8701 Personal history of pneumonia (recurrent): Secondary | ICD-10-CM | POA: Insufficient documentation

## 2014-09-15 DIAGNOSIS — I2510 Atherosclerotic heart disease of native coronary artery without angina pectoris: Secondary | ICD-10-CM | POA: Insufficient documentation

## 2014-09-15 DIAGNOSIS — S8991XA Unspecified injury of right lower leg, initial encounter: Secondary | ICD-10-CM | POA: Insufficient documentation

## 2014-09-15 DIAGNOSIS — Y9301 Activity, walking, marching and hiking: Secondary | ICD-10-CM | POA: Diagnosis not present

## 2014-09-15 DIAGNOSIS — Y998 Other external cause status: Secondary | ICD-10-CM | POA: Insufficient documentation

## 2014-09-15 DIAGNOSIS — J45909 Unspecified asthma, uncomplicated: Secondary | ICD-10-CM | POA: Diagnosis not present

## 2014-09-15 DIAGNOSIS — F419 Anxiety disorder, unspecified: Secondary | ICD-10-CM | POA: Insufficient documentation

## 2014-09-15 DIAGNOSIS — W19XXXA Unspecified fall, initial encounter: Secondary | ICD-10-CM

## 2014-09-15 DIAGNOSIS — M25561 Pain in right knee: Secondary | ICD-10-CM

## 2014-09-15 MED ORDER — HYDROCODONE-ACETAMINOPHEN 5-325 MG PO TABS: 1.0000 | ORAL_TABLET | ORAL | Status: DC | PRN

## 2014-09-15 MED ORDER — OXYCODONE-ACETAMINOPHEN 5-325 MG PO TABS
2.0000 | ORAL_TABLET | Freq: Once | ORAL | Status: AC
  Administered 2014-09-15: 2 via ORAL
  Filled 2014-09-15: qty 2

## 2014-09-15 NOTE — ED Provider Notes (Signed)
CSN: [id number]     Arrival date & time 09/15/14  1232 History   This chart was scribed for non-physician practitioner, Quincy Carnes, PA-C working with Forde Dandy, MD by Judithann Sauger, ED Scribe. The patient was seen in room WTR8/WTR8 and the patient's care was started at 1:10 PM    Chief Complaint  Patient presents with  . Knee Pain   The history is provided by the patient. No language interpreter was used.   HPI Comments: Kathleen [name] is a 59 y.o. female who presents to the Emergency Department complaining of bilateral knee pain. Patient states she tripped over the threshold of the doorway and fell onto both knees. She denies any head injury or loss of consciousness. Patient states she was not able to ambulate due to pain, thus was sent here by EMS. Reports pain in her right knee is worse than left. She has had prior right knee injuries in the past. She denies numbness or weakness of her legs. Of note, patient was recently started on xarelto for AFIB.  VSS.  Past Medical History  Diagnosis Date  . 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 Hypertension   . Depression   . Dyslipidemia   . Psychiatric hospitalization 05/2008  . Asthma   . Coronary artery disease   . Anxiety   . Morbid obesity with BMI of 40.0-44.9, adult   . Hyperlipidemia   . Knee pain, left   . PONV (postoperative nausea and vomiting)   . Lower extremity deep venous thrombosis     20 years ago   . CHF (congestive heart failure)     pt seen at heart/vascular spec clinic 08/14/2014   . Shortness of breath dyspnea     exercise   . Pneumonia     hx of   . Bronchitis     hx of   . Fall   . Urinary tract bacterial infections   . Urinary incontinence   . Cancer     basal cell carcinoma on right hand   . Anemia   . Shingles     20 years ago   . Measles     hx of in childhood    Past Surgical History  Procedure Laterality Date  . Breast mass excision      age 46, benign tumor   . Knee surgery      right, removed cartilage  . Tubal ligation    . Tonsillectomy    . Dilation and curettage of uterus    . Cardiac catheterization    . Parathyroidectomy N/A 08/25/2014    Procedure: PARATHYROIDECTOMY;  Surgeon: Armandina Gemma, MD;  Location: [facility];  Service: General;  Laterality: N/A;   Family History  Problem Relation Age of Onset  . Breast cancer Mother 67  . Alcohol abuse Mother   . Alcohol abuse Father   . Alcohol abuse Maternal Grandfather   . Drug abuse Maternal Grandfather   . Alcohol abuse Maternal Grandmother   . Alcohol abuse Paternal Grandfather   . Alcohol abuse Paternal Grandmother   . Schizophrenia Cousin    Social History  Substance Use Topics  . Smoking status: Never Smoker   . Smokeless tobacco: Never Used  . Alcohol Use: 0.0 oz/week    0 Glasses of wine per week     Comment: occ.   OB History    No data available     Review of Systems  Musculoskeletal: Positive for arthralgias.  All other systems reviewed and are negative.     Allergies  Lamictal and Eggs or egg-derived products  Home Medications   Prior to Admission medications   Medication Sig Start Date End Date Taking? Authorizing Provider  albuterol (PROVENTIL HFA;VENTOLIN HFA) 108 (90 BASE) MCG/ACT inhaler Inhale 2 puffs into the lungs every 6 (six) hours as needed for wheezing. 11/12/12   Rosalita Chessman, DO  ALPRAZolam Duanne Moron) 0.5 MG tablet Take 0.5 mg by mouth 2 (two) times daily as needed for anxiety.    Historical Provider, MD  Cholecalciferol (VITAMIN D3) 5000 UNITS CAPS Take 5,000 Units by mouth daily.    Historical Provider, MD  Cranberry-Cholecalciferol 4200-500 MG-UNIT CAPS Take 1 capsule by mouth at bedtime.    Historical Provider, MD  divalproex (DEPAKOTE ER) 500 MG 24 hr tablet Take 1,500 mg by mouth at bedtime.     Historical Provider, MD  furosemide (LASIX) 20 MG tablet Take 20 mg by mouth daily.    Historical Provider, MD  lisinopril (PRINIVIL,ZESTRIL) 10 MG  tablet Take 1 tablet (10 mg total) by mouth daily. NEEDS APPOINTMENT WITH DR Martinique FOR FUTURE REFILLS 05/26/14   Peter M Martinique, MD  lisinopril (PRINIVIL,ZESTRIL) 10 MG tablet TAKE 1 TABLET (10 MG TOTAL) BY MOUTH DAILY. 09/14/14   Minus Breeding, MD  lurasidone (LATUDA) 40 MG TABS tablet Take 40 mg by mouth at bedtime.     Historical Provider, MD  metoprolol succinate (TOPROL-XL) 25 MG 24 hr tablet Take 1 tablet (25 mg total) by mouth at bedtime. 09/13/14   Almyra Deforest, PA  nitroGLYCERIN (NITROSTAT) 0.4 MG SL tablet Place 1 tablet (0.4 mg total) under the tongue every 5 (five) minutes as needed for chest pain. For chest pain 12/07/11   Evelene Croon Barrett, PA-C  OLANZapine (ZYPREXA) 10 MG tablet Take 10 mg by mouth at bedtime.    Historical Provider, MD  potassium chloride SA (K-DUR,KLOR-CON) 20 MEQ tablet Take 20 mEq by mouth daily.    Historical Provider, MD  rivaroxaban (XARELTO) 20 MG TABS tablet Take 1 tablet (20 mg total) by mouth daily with supper. 09/14/14   Almyra Deforest, PA  simvastatin (ZOCOR) 20 MG tablet TAKE 1 TABLET (20 MG TOTAL) BY MOUTH AT BEDTIME. 08/10/14   Midge Minium, MD  SUPER B COMPLEX/C PO Take 1 capsule by mouth daily.    Historical Provider, MD   BP 126/92 mmHg  Pulse 85  Temp(Src) 98.2 F (36.8 C) (Oral)  Resp 17  SpO2 95%   Physical Exam  Constitutional: She is oriented to person, place, and time. She appears well-developed and well-nourished.  HENT:  Head: Normocephalic and atraumatic.  Mouth/Throat: Oropharynx is clear and moist.  No visible signs of head trauma  Eyes: Conjunctivae and EOM are normal. Pupils are equal, round, and reactive to light.  Neck: Normal range of motion.  Cardiovascular: Normal rate, regular rhythm and normal heart sounds.   Pulmonary/Chest: Effort normal and breath sounds normal.  Abdominal: Soft. Bowel sounds are normal.  Musculoskeletal: Normal range of motion.       Right knee: She exhibits swelling and ecchymosis. She exhibits normal  range of motion, no effusion, no deformity and no laceration. Tenderness found. Medial joint line tenderness noted.       Left knee: She exhibits ecchymosis. Tenderness found.       Right lower leg: She exhibits tenderness and bony tenderness.       Legs: Bilateral knees with  bruising and tenderness noted to anterior aspects; mild swelling noted without appreciable joint effusions; full ROM maintained, some pain noted with full flexion; slight tenderness of right tibia without acute deformity or swelling; bilateral legs remain NVI; compartments soft, easily compressible  Neurological: She is alert and oriented to person, place, and time.  AAOx3, answering questions appropriately; equal strength UE and LE bilaterally; CN grossly intact; moves all extremities appropriately without ataxia; no focal neuro deficits or facial asymmetry appreciated  Skin: Skin is warm and dry.  Psychiatric: She has a normal mood and affect.  Nursing note and vitals reviewed.   ED Course  Procedures (including critical care time) DIAGNOSTIC STUDIES: Oxygen Saturation is 95% on RA, adequate by my interpretation.    COORDINATION OF CARE: 1:10 PM- Pt advised of plan for treatment and pt agrees.    Labs Review Labs Reviewed - No data to display  Imaging Review Dg Tibia/fibula Right  09/15/2014   CLINICAL DATA:  Fall, knee injuries  EXAM: RIGHT TIBIA AND FIBULA - 2 VIEW  COMPARISON:  None.  FINDINGS: Four views of the right tibia-fibula submitted. No acute fracture or subluxation. Degenerative changes right knee joint. There is plantar spur of calcaneus.  IMPRESSION: No acute fracture or subluxation. Degenerative changes right knee joint. Plantar spur of calcaneus.   Electronically Signed   By: Lahoma Crocker M.D.   On: 09/15/2014 14:10   Dg Knee Complete 4 Views Left  09/15/2014   CLINICAL DATA:  Pt states she slipped don a floor mat walking into the school and fell forward on the concrete. Pt c/o of anterior knee pain  to bilateral knees and anterior right tib/fib from knee down to ankle. Bruising to both knees below knee caps. Pt states hx of surgery to right knee x 64yrs ago for ACL tear.  EXAM: LEFT KNEE - COMPLETE 4+ VIEW  COMPARISON:  None.  FINDINGS: No fracture.  No dislocation.  There is mild medial joint space and patellofemoral joint space compartment narrowing. Small marginal osteophytes project from all 3 compartments.  There is a small joint effusion.  Soft tissues are unremarkable.  IMPRESSION: No fracture or dislocation. Osteoarthritis and small joint effusion.   Electronically Signed   By: Lajean Manes M.D.   On: 09/15/2014 14:13   Dg Knee Complete 4 Views Right  09/15/2014   CLINICAL DATA:  Fall, anterior knee pain, history of surgery 20 years ago for ACL tear  EXAM: RIGHT KNEE - COMPLETE 4+ VIEW  COMPARISON:  None.  FINDINGS: No fracture or dislocation is seen.  Moderate tricompartmental degenerative changes, most prominent in the lateral compartment.  The visualized soft tissues are unremarkable.  No suprapatellar knee joint effusion.  IMPRESSION: No fracture or dislocation is seen.  Moderate degenerative changes.   Electronically Signed   By: Julian Hy M.D.   On: 09/15/2014 14:11   I have personally reviewed and evaluated these images and lab results as part of my medical decision-making.   EKG Interpretation None      MDM   Final diagnoses:  Fall, initial encounter  Bilateral knee pain   59 year old female here with mechanical fall earlier today at work. She fell onto both knees after tripping through a doorway. No head injury or loss of consciousness. Patient is on xarelto for AFIB.  Patient has tenderness and bruising to bilateral knees, R > L.  Both legs remain NVI without numbness or weakness.  Some slight tenderness of right shin as well.  X-rays were obtained, no acute findings noted.  Patient was given crutches to assist with ambulation, placed on sedentary work duty for the  next week.  Rx vicodin.  Worker's comp UDS performed by phlebotomy prior to discharge.  Will give referral to orthopedics if needed.  Discussed plan with patient, he/she acknowledged understanding and agreed with plan of care.  Return precautions given for new or worsening symptoms.  I personally performed the services described in this documentation, which was scribed in my presence. The recorded information has been reviewed and is accurate.  Larene Pickett, PA-C 09/15/14 [place] Liu, MD 09/15/14 [phone]

## 2014-09-15 NOTE — Discharge Instructions (Signed)
Take the prescribed medication as directed.  Recommend to ice and elevate knees at home to help with pain/swelling. Follow-up with your primary care physician. Referral given to orthopedics if no improvement in the next week or if symptoms worsen. Return to the ED for new or worsening symptoms.

## 2014-09-15 NOTE — ED Notes (Addendum)
Per EMS- was walking into a school. Tripped over sidewalk and fell and landed face forward- pain distal to both knees. Did not hit head.  BP 180/100 (hx HTN), HR 84 RR 12. Hx depression, CHF, a-fib, HTN.   Addendum: pt was hospitalized recently and switched from Plavix to Xarelto for a-fib. Did not hit her head. Denies dizziness before falling.

## 2014-09-17 ENCOUNTER — Other Ambulatory Visit: Payer: Self-pay | Admitting: Obstetrics and Gynecology

## 2014-09-17 DIAGNOSIS — Z803 Family history of malignant neoplasm of breast: Secondary | ICD-10-CM

## 2014-09-24 ENCOUNTER — Ambulatory Visit (HOSPITAL_BASED_OUTPATIENT_CLINIC_OR_DEPARTMENT_OTHER)
Admission: RE | Admit: 2014-09-24 | Discharge: 2014-09-24 | Disposition: A | Discharge status: Home / Self Care | Payer: BC Managed Care – PPO | Source: Ambulatory Visit | Attending: Family Medicine | Admitting: Family Medicine

## 2014-09-24 ENCOUNTER — Ambulatory Visit (INDEPENDENT_AMBULATORY_CARE_PROVIDER_SITE_OTHER): Payer: BC Managed Care – PPO | Admitting: Family Medicine

## 2014-09-24 ENCOUNTER — Encounter: Payer: Self-pay | Admitting: Family Medicine

## 2014-09-24 VITALS — BP 120/86 | HR 71 | Temp 98.5°F | Resp 18 | Ht 72.0 in | Wt 327.2 lb

## 2014-09-24 DIAGNOSIS — S6991XA Unspecified injury of right wrist, hand and finger(s), initial encounter: Secondary | ICD-10-CM | POA: Insufficient documentation

## 2014-09-24 DIAGNOSIS — I4891 Unspecified atrial fibrillation: Secondary | ICD-10-CM | POA: Diagnosis not present

## 2014-09-24 DIAGNOSIS — L03115 Cellulitis of right lower limb: Secondary | ICD-10-CM

## 2014-09-24 DIAGNOSIS — S62316A Displaced fracture of base of fifth metacarpal bone, right hand, initial encounter for closed fracture: Secondary | ICD-10-CM | POA: Diagnosis not present

## 2014-09-24 DIAGNOSIS — R911 Solitary pulmonary nodule: Secondary | ICD-10-CM | POA: Insufficient documentation

## 2014-09-24 DIAGNOSIS — M25561 Pain in right knee: Secondary | ICD-10-CM | POA: Diagnosis not present

## 2014-09-24 DIAGNOSIS — M79641 Pain in right hand: Secondary | ICD-10-CM | POA: Diagnosis present

## 2014-09-24 DIAGNOSIS — W19XXXA Unspecified fall, initial encounter: Secondary | ICD-10-CM | POA: Insufficient documentation

## 2014-09-24 DIAGNOSIS — M25562 Pain in left knee: Secondary | ICD-10-CM

## 2014-09-24 MED ORDER — CEPHALEXIN 500 MG PO CAPS: 500.0000 mg | ORAL_CAPSULE | Freq: Three times a day (TID) | ORAL | Status: DC

## 2014-09-24 MED ORDER — HYDROCODONE-ACETAMINOPHEN 5-325 MG PO TABS: 1.0000 | ORAL_TABLET | Freq: Four times a day (QID) | ORAL | Status: DC | PRN

## 2014-09-24 NOTE — Progress Notes (Signed)
Pre visit review using our clinic review tool, if applicable. No additional management support is needed unless otherwise documented below in the visit note. 

## 2014-09-24 NOTE — Progress Notes (Signed)
   Subjective:    Patient ID: Kathleen [name], female    DOB: Jun 23, 1955, 59 y.o.   MRN: [id number]  HPI B knee pain- pt fell on both knees on 8/23.  Went to ER for evaluation.  Thankfully no broken bones.  Has extensive bruising of both knees and lower legs.  Painful to rise from seated position.  Difficult to stand for any period of time.  Painful to change from standing to sitting.  Currently out of pain meds- hydrocodone.  Some improvement when taking meds.  Pt continues to ice.  R knee w/ redness and warmth to touch- pt noticed this a few days ago.  Having pain in R 5th metacarpal since falling- did not have this imaged.  Pt is also on xarelto for recent Afib. Hospitalized 8/19-21 for Afib.  Pt had normal ECHO but had 1.1 cm nodule in lingula- recommended repeat CT in 3 months   Review of Systems For ROS see HPI     Objective:   Physical Exam  Constitutional: She is oriented to person, place, and time. She appears well-developed and well-nourished. No distress.  HENT:  Head: Normocephalic and atraumatic.  Eyes: Conjunctivae are normal. Pupils are equal, round, and reactive to light.  Cardiovascular: Normal rate, regular rhythm and normal heart sounds.   Pulmonary/Chest: Effort normal and breath sounds normal. No respiratory distress. She has no wheezes. She has no rales.  Musculoskeletal: She exhibits edema (swelling over anterior knees bilaterally w/ TTP) and tenderness (TTP over R 5th metacarpal w/o obvious swelling).  Neurological: She is alert and oriented to person, place, and time.  Skin: Skin is warm and dry. There is erythema (redness and warmth of R lower leg concerning for cellulitis).  Psychiatric: She has a normal mood and affect. Her behavior is normal. Thought content normal.  Vitals reviewed.         Assessment & Plan:

## 2014-09-24 NOTE — Patient Instructions (Signed)
Follow up as scheduled Go downstairs and get your xray for your hand Use the Hydrocodone as needed for pain ICE! We will repeat the CT scan in 3 months as recommended Call with any questions or concerns Hang in there!!

## 2014-09-25 ENCOUNTER — Telehealth: Payer: Self-pay

## 2014-09-25 NOTE — Telephone Encounter (Signed)
-----   Message from Midge Minium, MD sent at 09/24/2014  2:48 PM EDT ----- You have a mildly displaced fracture of 5th metacarpal base.  Based on this, we are going to send you to ortho for evaluation and tx (order already entered)

## 2014-09-25 NOTE — Telephone Encounter (Signed)
LMOWM

## 2014-09-25 NOTE — Telephone Encounter (Signed)
Patient returning your call. Would like x ray report fax [phone]

## 2014-09-26 ENCOUNTER — Other Ambulatory Visit (HOSPITAL_COMMUNITY): Payer: Self-pay | Admitting: Internal Medicine

## 2014-09-29 NOTE — Assessment & Plan Note (Signed)
New.  Pt w/ traumatic fall and now pain over 5th metacarpal.  Get xray to assess for fracture.  If fx present, will refer to ortho.  Pt expressed understanding and is in agreement w/ plan.

## 2014-09-29 NOTE — Telephone Encounter (Signed)
Received the failed transmission. Resent and confirmation filed.

## 2014-09-29 NOTE — Telephone Encounter (Signed)
As per patient fax never received.

## 2014-09-29 NOTE — Assessment & Plan Note (Signed)
Pt was admitted 8/19-21 due to afib.  Started on Xarelto.  Normal ECHO.  Currently in NSR.  Has cards f/u scheduled.  Will continue to follow along.

## 2014-09-29 NOTE — Assessment & Plan Note (Signed)
Pt has 1.1 cm nodular opacity in lingula which they believe is scaring but it is recommended to repeat CT in 3 months.  Pt aware and agreeable to repeating scan.

## 2014-09-29 NOTE — Telephone Encounter (Signed)
Fax sent and pt notified. Has an appointment with ortho at 2pm today.

## 2014-09-29 NOTE — Assessment & Plan Note (Signed)
New.  Start abx.  Reviewed supportive care and red flags that should prompt return.  Pt expressed understanding and is in agreement w/ plan.  

## 2014-09-29 NOTE — Assessment & Plan Note (Signed)
New.  Pt w/ bilateral lower leg hematomas after falling on both legs.  Pt had recently started Xarelto which would account for the hematomas.  Reviewed supportive care.  Pain meds given.  Pt also w/ R lower leg cellulitis at this time- start abx.  Reviewed supportive care and red flags that should prompt return.  Pt expressed understanding and is in agreement w/ plan.

## 2014-09-30 ENCOUNTER — Ambulatory Visit
Admission: RE | Admit: 2014-09-30 | Discharge: 2014-09-30 | Disposition: A | Discharge status: Home / Self Care | Payer: BC Managed Care – PPO | Source: Ambulatory Visit | Attending: Obstetrics and Gynecology | Admitting: Obstetrics and Gynecology

## 2014-09-30 DIAGNOSIS — Z803 Family history of malignant neoplasm of breast: Secondary | ICD-10-CM

## 2014-09-30 MED ORDER — GADOBENATE DIMEGLUMINE 529 MG/ML IV SOLN
20.0000 mL | Freq: Once | INTRAVENOUS | Status: AC | PRN
  Administered 2014-09-30: 20 mL via INTRAVENOUS

## 2014-10-01 ENCOUNTER — Telehealth: Payer: Self-pay | Admitting: Behavioral Health

## 2014-10-01 ENCOUNTER — Encounter: Payer: Self-pay | Admitting: Behavioral Health

## 2014-10-01 NOTE — Telephone Encounter (Signed)
Pre-Visit Call completed with patient and chart updated.   Pre-Visit Info documented in Specialty Comments under [employer].    

## 2014-10-02 ENCOUNTER — Encounter: Payer: Self-pay | Admitting: Family Medicine

## 2014-10-02 ENCOUNTER — Ambulatory Visit (INDEPENDENT_AMBULATORY_CARE_PROVIDER_SITE_OTHER): Payer: BC Managed Care – PPO | Admitting: Family Medicine

## 2014-10-02 VITALS — BP 136/86 | HR 85 | Temp 98.3°F | Resp 16 | Ht 72.0 in | Wt 327.4 lb

## 2014-10-02 DIAGNOSIS — L03115 Cellulitis of right lower limb: Secondary | ICD-10-CM | POA: Diagnosis not present

## 2014-10-02 DIAGNOSIS — Z0000 Encounter for general adult medical examination without abnormal findings: Secondary | ICD-10-CM

## 2014-10-02 DIAGNOSIS — Z23 Encounter for immunization: Secondary | ICD-10-CM | POA: Diagnosis not present

## 2014-10-02 DIAGNOSIS — R911 Solitary pulmonary nodule: Secondary | ICD-10-CM

## 2014-10-02 LAB — BASIC METABOLIC PANEL
BUN: 17 mg/dL (ref 7–25)
CALCIUM: 9.3 mg/dL (ref 8.6–10.4)
CO2: 25 mmol/L (ref 20–31)
CREATININE: 0.84 mg/dL (ref 0.50–1.05)
Chloride: 104 mmol/L (ref 98–110)
Glucose, Bld: 78 mg/dL (ref 65–99)
Potassium: 4 mmol/L (ref 3.5–5.3)
SODIUM: 146 mmol/L (ref 135–146)

## 2014-10-02 LAB — LIPID PANEL
CHOL/HDL RATIO: 3.1 ratio (ref <=5.0)
Cholesterol: 140 mg/dL (ref 125–200)
HDL: 45 mg/dL — AB (ref >=46)
LDL CALC: 73 mg/dL (ref <130)
Triglycerides: 111 mg/dL (ref <150)
VLDL: 22 mg/dL (ref <30)

## 2014-10-02 LAB — CBC WITH DIFFERENTIAL/PLATELET
BASOS ABS: 0 10*3/uL (ref 0.0–0.1)
Basophils Relative: 0 % (ref 0–1)
EOS ABS: 0.1 10*3/uL (ref 0.0–0.7)
EOS PCT: 2 % (ref 0–5)
HCT: 43.3 % (ref 36.0–46.0)
Hemoglobin: 14.3 g/dL (ref 12.0–15.0)
LYMPHS ABS: 1.5 10*3/uL (ref 0.7–4.0)
LYMPHS PCT: 21 % (ref 12–46)
MCH: 29 pg (ref 26.0–34.0)
MCHC: 33 g/dL (ref 30.0–36.0)
MCV: 87.8 fL (ref 78.0–100.0)
MPV: 11 fL (ref 8.6–12.4)
Monocytes Absolute: 0.6 10*3/uL (ref 0.1–1.0)
Monocytes Relative: 9 % (ref 3–12)
NEUTROS PCT: 68 % (ref 43–77)
Neutro Abs: 4.8 10*3/uL (ref 1.7–7.7)
PLATELETS: 184 10*3/uL (ref 150–400)
RBC: 4.93 MIL/uL (ref 3.87–5.11)
RDW: 14.2 % (ref 11.5–15.5)
WBC: 7.1 10*3/uL (ref 4.0–10.5)

## 2014-10-02 LAB — HEPATIC FUNCTION PANEL
ALT: 14 U/L (ref 6–29)
AST: 14 U/L (ref 10–35)
Albumin: 4.1 g/dL (ref 3.6–5.1)
Alkaline Phosphatase: 109 U/L (ref 33–130)
BILIRUBIN DIRECT: 0.2 mg/dL (ref <=0.2)
BILIRUBIN INDIRECT: 0.5 mg/dL (ref 0.2–1.2)
TOTAL PROTEIN: 6.7 g/dL (ref 6.1–8.1)
Total Bilirubin: 0.7 mg/dL (ref 0.2–1.2)

## 2014-10-02 LAB — TSH: TSH: 1.864 u[IU]/mL (ref 0.350–4.500)

## 2014-10-02 MED ORDER — DOXYCYCLINE HYCLATE 100 MG PO TABS: 100.0000 mg | ORAL_TABLET | Freq: Two times a day (BID) | ORAL | Status: DC

## 2014-10-02 NOTE — Progress Notes (Signed)
Pre visit review using our clinic review tool, if applicable. No additional management support is needed unless otherwise documented below in the visit note. 

## 2014-10-02 NOTE — Patient Instructions (Signed)
Follow up on Monday at 9:45 to recheck leg We'll notify you of your lab results and make any changes if needed Continue to work on healthy diet and regular exercise as able Start the Doxycycline twice daily- take w/ food- for the infection of the leg If the redness spreads upward or seems to be worsening- please call to be seen at Saturday clinic or go to the ER Call with any questions or concerns If you want to join [place] at the new [place] office, any scheduled appointments will automatically transfer and we will see you at [address] [place] [address] [place], [place], [place] [address]  Hang in there!!! Enjoy your dinner!!!

## 2014-10-02 NOTE — Progress Notes (Signed)
   Subjective:    Patient ID: Kathleen [name], female    DOB: 10-29-1955, 59 y.o.   MRN: [id number]  HPI CPE- UTD on colonoscopy (due 2021 w/ [facility]), pap, mammo.  Cellulitis- pt completed course of Keflex for R lower leg cellulitis.  Pt reports initial area has improved but now w/ streaking redness extending down front of shin.  TTP.   Review of Systems Patient reports no vision/ hearing changes, adenopathy,fever, weight change,  persistant/recurrent hoarseness , swallowing issues, edema, persistant/recurrent cough, hemoptysis, dyspnea (rest/exertional/paroxysmal nocturnal), gastrointestinal bleeding (melena, rectal bleeding), abdominal pain, significant heartburn, bowel changes, GU symptoms (dysuria, hematuria, incontinence), Gyn symptoms (abnormal  bleeding, pain),  syncope, focal weakness, memory loss, numbness & tingling, hair/nail changes, abnormal bruising or bleeding, anxiety, or depression.   + CP and palpitations w/ recent episode of Afib- this has resolved    Objective:   Physical Exam General Appearance:    Alert, cooperative, no distress, appears stated age  Head:    Normocephalic, without obvious abnormality, atraumatic  Eyes:    PERRL, conjunctiva/corneas clear, EOM's intact, fundi    benign, both eyes  Ears:    Normal TM's and external ear canals, both ears  Nose:   Nares normal, septum midline, mucosa normal, no drainage    or sinus tenderness  Throat:   Lips, mucosa, and tongue normal; teeth and gums normal  Neck:   Supple, symmetrical, trachea midline, no adenopathy;    Thyroid: no enlargement/tenderness/nodules  Back:     Symmetric, no curvature, ROM normal, no CVA tenderness  Lungs:     Clear to auscultation bilaterally, respirations unlabored  Chest Wall:    No tenderness or deformity   Heart:    Regular rate and rhythm, S1 and S2 normal, no murmur, rub   or gallop  Breast Exam:    Deferred to GYN  Abdomen:     Soft, non-tender, bowel sounds active all four  quadrants,    no masses, no organomegaly  Genitalia:    Deferred to GYN  Rectal:    Extremities:   Extremities normal, atraumatic, no cyanosis or edema  Pulses:   2+ and symmetric all extremities  Skin:   Streaking redness of R lower leg  Lymph nodes:   Cervical, supraclavicular, and axillary nodes normal  Neurologic:   CNII-XII intact, normal strength, sensation and reflexes    throughout          Assessment & Plan:

## 2014-10-03 LAB — VITAMIN D 25 HYDROXY (VIT D DEFICIENCY, FRACTURES): Vit D, 25-Hydroxy: 41 ng/mL (ref 30–100)

## 2014-10-03 NOTE — Assessment & Plan Note (Signed)
Deteriorated.  Pt now w/ streaking redness down anterior R lower leg despite course of Keflex.  This raises suspicion for MRSA.  Start Doxy.  Due to extension of infxn, pt will return for re-eval on Monday.  Reviewed supportive care and red flags that should prompt return.  Pt expressed understanding and is in agreement w/ plan.

## 2014-10-03 NOTE — Assessment & Plan Note (Signed)
Pt's PE WNL w/ exception of obesity and R lower leg cellulitis.  UTD on colonoscopy, pap, mammo.  Check labs.  Stressed need for healthy diet and regular exercise.  Anticipatory guidance provided.

## 2014-10-05 ENCOUNTER — Ambulatory Visit (INDEPENDENT_AMBULATORY_CARE_PROVIDER_SITE_OTHER): Payer: BC Managed Care – PPO | Admitting: Family Medicine

## 2014-10-05 ENCOUNTER — Encounter: Payer: Self-pay | Admitting: Family Medicine

## 2014-10-05 ENCOUNTER — Ambulatory Visit (HOSPITAL_COMMUNITY)
Admission: RE | Admit: 2014-10-05 | Discharge: 2014-10-05 | Disposition: A | Discharge status: Home / Self Care | Payer: BC Managed Care – PPO | Source: Ambulatory Visit | Attending: Internal Medicine | Admitting: Internal Medicine

## 2014-10-05 VITALS — BP 130/80 | HR 84 | Temp 97.9°F | Resp 16 | Wt 327.4 lb

## 2014-10-05 VITALS — BP 130/78 | HR 88 | Wt 327.1 lb

## 2014-10-05 DIAGNOSIS — I10 Essential (primary) hypertension: Secondary | ICD-10-CM | POA: Diagnosis not present

## 2014-10-05 DIAGNOSIS — I480 Paroxysmal atrial fibrillation: Secondary | ICD-10-CM | POA: Diagnosis not present

## 2014-10-05 DIAGNOSIS — E213 Hyperparathyroidism, unspecified: Secondary | ICD-10-CM | POA: Insufficient documentation

## 2014-10-05 DIAGNOSIS — I5032 Chronic diastolic (congestive) heart failure: Secondary | ICD-10-CM | POA: Diagnosis not present

## 2014-10-05 DIAGNOSIS — L03115 Cellulitis of right lower limb: Secondary | ICD-10-CM

## 2014-10-05 DIAGNOSIS — I2510 Atherosclerotic heart disease of native coronary artery without angina pectoris: Secondary | ICD-10-CM | POA: Diagnosis not present

## 2014-10-05 DIAGNOSIS — R0683 Snoring: Secondary | ICD-10-CM | POA: Diagnosis not present

## 2014-10-05 DIAGNOSIS — R002 Palpitations: Secondary | ICD-10-CM | POA: Diagnosis not present

## 2014-10-05 MED ORDER — FUROSEMIDE 20 MG PO TABS: 40.0000 mg | ORAL_TABLET | Freq: Every day | ORAL | Status: DC

## 2014-10-05 MED ORDER — METOPROLOL SUCCINATE ER 25 MG PO TB24: 50.0000 mg | ORAL_TABLET | Freq: Every day | ORAL | Status: DC

## 2014-10-05 MED ORDER — POTASSIUM CHLORIDE CRYS ER 20 MEQ PO TBCR: 40.0000 meq | EXTENDED_RELEASE_TABLET | Freq: Every day | ORAL | Status: DC

## 2014-10-05 NOTE — Progress Notes (Signed)
Pre visit review using our clinic review tool, if applicable. No additional management support is needed unless otherwise documented below in the visit note. 

## 2014-10-05 NOTE — Assessment & Plan Note (Signed)
Improved since starting Doxy on Friday.  Again reviewed need to monitor for worsening redness or extension of infxn.  Pt expressed understanding and is in agreement w/ plan.

## 2014-10-05 NOTE — Patient Instructions (Signed)
INCREASE Lasix to 40mg  daily.  INCREASE Potassium to 40 meq daily.  INCREASE Toprol XL to 50mg  daily.  LABS: 2 weeks bmet  FOLLOW UP: 2 months

## 2014-10-05 NOTE — Progress Notes (Signed)
Advanced Heart Failure Medication Review by a Pharmacist  Does the patient  feel that his/her medications are working for him/her?  yes  Has the patient been experiencing any side effects to the medications prescribed?  no  Does the patient measure his/her own blood pressure or blood glucose at home?  no   Does the patient have any problems obtaining medications due to transportation or finances?   no  Understanding of regimen: good Understanding of indications: good Potential of compliance: good    Pharmacist comments:  Kathleen [name]'s mother in law) is a pleasant 59 yo F presenting without a medication list but with good recall of each of her medications. She reports excellent compliance with all of her medications. She did not have any specific medication-related questions or concerns at this time.   [name]. Velva Harman, PharmD, BCPS, CPP Clinical Pharmacist Pager: [phone] Phone: [phone] 10/05/2014 2:25 PM

## 2014-10-05 NOTE — Progress Notes (Signed)
   Subjective:    Patient ID: Kathleen [name], female    DOB: October 05, 1955, 59 y.o.   MRN: [id number]  HPI Cellulitis- R lower leg.  Less red, less swollen.  Still tender.  Pt feels area is slightly improved.   Review of Systems For ROS see HPI     Objective:   Physical Exam  Constitutional: She appears well-developed and well-nourished. No distress.  obesity  Cardiovascular: Intact distal pulses.   Skin: Skin is warm and dry. There is erythema (warm, red streak extending down R lower leg but less intense than on Friday).  Psychiatric: She has a normal mood and affect. Her behavior is normal. Thought content normal.  Vitals reviewed.         Assessment & Plan:

## 2014-10-05 NOTE — Patient Instructions (Signed)
No changes at this time in your medication Finish the Doxy as directed- take w/ food If at any time this week you feel things are worsening- please let me know immediately or go to the ER Call with any questions or concerns Have a great week!!!

## 2014-10-05 NOTE — Progress Notes (Signed)
Patient ID: Kathleen [name], female   DOB: [birth year], 59 y.o.   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    Had NSTEMI in 2/09 had cath at that time with occlusion of septal perforator. Otherwise normal arteries. Was admitted for CP in 11/13. CE normal. Underwent dobutamine stress echo which was normal.   She had a sleep study in 11/15 with minimal OSA.    Admitted 09/11/14 for Afib RVR. She converted to sinus tach with dilt drip and was transitioned to Toprol-XL 25 mg daily. She was in NSR at the time of discharge. Discharge weight was 325 lb.  She returns for post hospital follow up. She is up 4 lbs from her last visit. Weights at home stable in low 320s. 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 Not taking extra lasix. Had a little CP with tachypalpitations last week that lasted about 30 minutes and went away with rest. She did not take a NTG. Denies PND/orthopnea.  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  Not exercising. Does not eat extra salt but drinks a lot of fluid.  Echo 11/13 EF 60-65% Mild LAE. RV normal. No PH. No comment on diastolic function.  ECHO 09/04/13 EF 55-60%  Grade I DD  Labs:  09/25/13 K 4.1 Creatinine 1.0 , LDL 79  Allergies  Allergen Reactions  . Lamictal [Lamotrigine]     Rash   . Eggs Or Egg-Derived Products Hives    Can eat foods with cooked eggs; CANNOT handle when part of flu vaccine, etc.     Current Outpatient Prescriptions  Medication Sig Dispense Refill  . albuterol (PROVENTIL HFA;VENTOLIN HFA) 108 (90 BASE) MCG/ACT inhaler Inhale 2 puffs into the lungs every 6 (six) hours as needed for wheezing. 1 Inhaler 1  . ALPRAZolam (XANAX) 0.5 MG tablet Take 0.5 mg by mouth 2 (two) times daily as needed for anxiety.    . Cholecalciferol (VITAMIN D3) 5000 UNITS CAPS Take  5,000 Units by mouth daily.    . Cranberry-Cholecalciferol 4200-500 MG-UNIT CAPS Take 1 capsule by mouth at bedtime.    . divalproex (DEPAKOTE ER) 500 MG 24 hr tablet Take 1,500 mg by mouth at bedtime.     [name] doxycycline (VIBRA-TABS) 100 MG tablet Take 1 tablet (100 mg total) by mouth 2 (two) times daily. 20 tablet 0  . furosemide (LASIX) 20 MG tablet Take 20 mg by mouth daily.    [name] lisinopril (PRINIVIL,ZESTRIL) 10 MG tablet Take 1 tablet (10 mg total) by mouth daily. NEEDS APPOINTMENT WITH DR Martinique FOR FUTURE REFILLS 30 tablet 0  . lurasidone (LATUDA) 40 MG TABS tablet Take 40 mg by mouth at bedtime.     . metoprolol succinate (TOPROL-XL) 25 MG 24 hr tablet Take 1 tablet (25 mg total) by mouth at bedtime. 90 tablet 3  . OLANZapine (ZYPREXA) 10 MG tablet Take 10 mg by mouth at bedtime.    . potassium chloride SA (K-DUR,KLOR-CON) 20 MEQ tablet Take 20 mEq by mouth daily.    . rivaroxaban (XARELTO) 20 MG TABS tablet Take 1 tablet (20 mg total) by mouth daily with supper. 90 tablet 3  . simvastatin (ZOCOR) 20 MG tablet TAKE 1 TABLET (20 MG TOTAL) BY MOUTH AT BEDTIME. 90 tablet 0  .  SUPER B COMPLEX/C PO Take 1 capsule by mouth daily.    . nitroGLYCERIN (NITROSTAT) 0.4 MG SL tablet Place 1 tablet (0.4 mg total) under the tongue every 5 (five) minutes as needed for chest pain. For chest pain (Patient not taking: Reported on 10/05/2014) 25 tablet 3   No current facility-administered medications for this encounter.    Past Medical History  Diagnosis Date  . 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 Hypertension   . Depression   . Dyslipidemia   . Psychiatric hospitalization 05/2008  . Asthma   . Coronary artery disease   . Anxiety   . Morbid obesity with BMI of 40.0-44.9, adult   . Hyperlipidemia   . Knee pain, left   . PONV (postoperative nausea and vomiting)   . Lower extremity deep venous thrombosis     20 years ago   . CHF (congestive heart failure)     pt seen  at heart/vascular spec clinic 08/14/2014   . Shortness of breath dyspnea     exercise   . Pneumonia     hx of   . Bronchitis     hx of   . Fall   . Urinary tract bacterial infections   . Urinary incontinence   . Cancer     basal cell carcinoma on right hand   . Anemia   . Shingles     20 years ago   . Measles     hx of in childhood   . Atrial fibrillation     Past Surgical History  Procedure Laterality Date  . Breast mass excision      age 86, benign tumor  . Knee surgery      right, removed cartilage  . Tubal ligation    . Tonsillectomy    . Dilation and curettage of uterus    . Cardiac catheterization    . Parathyroidectomy N/A 08/25/2014    Procedure: PARATHYROIDECTOMY;  Surgeon: Armandina Gemma, MD;  Location: [facility];  Service: General;  Laterality: N/A;    ROS:  As stated in the HPI and negative for all other systems.  PHYSICAL EXAM BP 130/78 mmHg  Pulse 88  Wt 327 lb 1.9 oz (148.381 kg)  SpO2 98% GENERAL:  Obese, NAD.  HEENT:  Normal NECK:  Supple. JVP hard to see - appears mildly elevated   Carotid upstroke brisk and symmetric, no bruits, no thyromegaly LUNGS:  Clear to auscultation bilaterally CHEST:  Unremarkable HEART:  PMI not displaced or sustained,S1 and S2 within normal limits, no S3, no S4, no clicks, no rubs, no murmurs appreciated ABD:  Obese. Soft NT/ND , no rebound, no guarding,  EXT:  2 plus pulses throughout,  No edema. No cyanosis no clubbing, chronic venous stasis changes with 1+ ankle edema bilaterally with trace pretibial edema.   PSYCH: Flat affect  ASSESSMENT AND PLAN 1. Chronic diastolic HF: Normal EF with grade I diastolic dysfunction on echo 09/13/14.  - Volume status slightly up currently. - Increase lasix to 40 mg daily and KCL to 54meq daily 2. PAF. 1st noted during hospital admit 8/16. CHADSVASC = 3. - Remains in NSR Continue Xarelto 20 daily - Continues with intermittent palpitations. Discussed possible use of AliveCor device.  -  Increase Toprol to 50 daily 3. Morbid obesity:  - She needs to continue aggressive efforts at diet and exercise for weight loss.  4. CAD: She will continue ASA 81, Plavix, statin.  5. HTN: BP controlled.  6. Snoring: Had sleep study. No sleep apnea.  7. Hyperparathyroidism-  Had partial parathyroidectomy in August   Follow up in 2 months.  Shirley Friar PA-C  10/05/2014  Patient seen and examined with Oda Kilts, PA-C. We discussed all aspects of the encounter. I agree with the assessment and plan as stated above.   She is mildly volume overloaded. Agree with increasing lasix and KCL. Is in NSR today but continues with intermittent palpitations. Discussed use of AliveCor device. Will increase Toprol. Cotninue Xarelto.Discussed need for weight loss as well as using a 4-pronged cane for stability.   Bensimhon, Daniel,MD 10:22 PM

## 2014-10-10 ENCOUNTER — Other Ambulatory Visit (HOSPITAL_COMMUNITY): Payer: Self-pay | Admitting: Cardiology

## 2014-10-11 ENCOUNTER — Other Ambulatory Visit: Payer: Self-pay | Admitting: Physician Assistant

## 2014-10-12 NOTE — Telephone Encounter (Signed)
This refill needs to go to Heart failure clinic,not [facility] [place] pt

## 2014-10-12 NOTE — Telephone Encounter (Signed)
Please review for refill.  

## 2014-10-12 NOTE — Telephone Encounter (Signed)
Patient called no answer.Left message on personal voice mail.I received message you need a medication refill,after reviewing chart you need appointment with Dr.Hochrein.Advised to call back to schedule appointment.

## 2014-10-13 ENCOUNTER — Other Ambulatory Visit (HOSPITAL_COMMUNITY): Payer: Self-pay | Admitting: *Deleted

## 2014-10-13 MED ORDER — RIVAROXABAN 20 MG PO TABS: ORAL_TABLET | ORAL | Status: DC

## 2014-10-21 ENCOUNTER — Ambulatory Visit (HOSPITAL_COMMUNITY)
Admission: RE | Admit: 2014-10-21 | Discharge: 2014-10-21 | Disposition: A | Discharge status: Home / Self Care | Payer: BC Managed Care – PPO | Source: Ambulatory Visit | Attending: Internal Medicine | Admitting: Internal Medicine

## 2014-10-21 DIAGNOSIS — I5032 Chronic diastolic (congestive) heart failure: Secondary | ICD-10-CM | POA: Diagnosis present

## 2014-10-21 DIAGNOSIS — I5022 Chronic systolic (congestive) heart failure: Secondary | ICD-10-CM | POA: Diagnosis not present

## 2014-10-21 LAB — BASIC METABOLIC PANEL
Anion gap: 9 (ref 5–15)
BUN: 16 mg/dL (ref 6–20)
CHLORIDE: 103 mmol/L (ref 101–111)
CO2: 28 mmol/L (ref 22–32)
CREATININE: 0.95 mg/dL (ref 0.44–1.00)
Calcium: 9.3 mg/dL (ref 8.9–10.3)
GFR calc non Af Amer: >60 mL/min (ref >60)
Glucose, Bld: 95 mg/dL (ref 65–99)
POTASSIUM: 5.4 mmol/L — AB (ref 3.5–5.1)
Sodium: 140 mmol/L (ref 135–145)

## 2014-10-30 ENCOUNTER — Ambulatory Visit (INDEPENDENT_AMBULATORY_CARE_PROVIDER_SITE_OTHER): Payer: BC Managed Care – PPO | Admitting: Internal Medicine

## 2014-10-30 VITALS — BP 142/82 | HR 75 | Temp 98.7°F | Resp 14 | Wt 329.6 lb

## 2014-10-30 DIAGNOSIS — E210 Primary hyperparathyroidism: Secondary | ICD-10-CM | POA: Diagnosis not present
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
## 2014-10-30 NOTE — Patient Instructions (Signed)
Please stop at the lab.  Please return in 1 year.  

## 2014-10-31 LAB — PTH, INTACT AND CALCIUM: PTH: 129 pg/mL — ABNORMAL HIGH (ref 15–65)

## 2014-11-03 ENCOUNTER — Encounter: Payer: Self-pay | Admitting: Internal Medicine

## 2014-11-03 LAB — PTH, INTACT AND CALCIUM: Calcium: 8.8 mg/dL (ref 8.7–10.2)

## 2014-12-01 ENCOUNTER — Ambulatory Visit (HOSPITAL_BASED_OUTPATIENT_CLINIC_OR_DEPARTMENT_OTHER)
Admission: RE | Admit: 2014-12-01 | Discharge: 2014-12-01 | Disposition: A | Discharge status: Home / Self Care | Payer: BC Managed Care – PPO | Source: Ambulatory Visit | Attending: Family Medicine | Admitting: Family Medicine

## 2014-12-01 DIAGNOSIS — I7789 Other specified disorders of arteries and arterioles: Secondary | ICD-10-CM | POA: Diagnosis not present

## 2014-12-01 DIAGNOSIS — R911 Solitary pulmonary nodule: Secondary | ICD-10-CM | POA: Diagnosis present

## 2014-12-01 DIAGNOSIS — K760 Fatty (change of) liver, not elsewhere classified: Secondary | ICD-10-CM | POA: Insufficient documentation

## 2014-12-06 ENCOUNTER — Other Ambulatory Visit: Payer: Self-pay | Admitting: Family Medicine

## 2014-12-08 ENCOUNTER — Telehealth (HOSPITAL_COMMUNITY): Payer: Self-pay

## 2014-12-08 NOTE — Telephone Encounter (Signed)
Disability paper work completed for NTA.  Placed in Dr. [name] for signature and then to be sent with addressed envelope provided by patient and with paper work

## 2014-12-11 ENCOUNTER — Other Ambulatory Visit: Payer: Self-pay | Admitting: Cardiology

## 2014-12-16 ENCOUNTER — Telehealth (HOSPITAL_COMMUNITY): Payer: Self-pay

## 2014-12-16 NOTE — Telephone Encounter (Signed)
Disability claim form completed signed and mailed to patient in self addressed envelope provided by patient

## 2015-01-06 ENCOUNTER — Other Ambulatory Visit (HOSPITAL_COMMUNITY): Payer: Self-pay | Admitting: Internal Medicine

## 2015-01-12 ENCOUNTER — Telehealth (HOSPITAL_COMMUNITY): Payer: Self-pay | Admitting: Cardiology

## 2015-01-12 NOTE — Telephone Encounter (Signed)
Attempting to schedule follow up in the NP clinic from the recall list Left message, will attempt to contact later

## 2015-02-05 ENCOUNTER — Telehealth (HOSPITAL_COMMUNITY): Payer: Self-pay | Admitting: Cardiology

## 2015-02-05 DIAGNOSIS — I5022 Chronic systolic (congestive) heart failure: Secondary | ICD-10-CM

## 2015-02-05 MED ORDER — FUROSEMIDE 20 MG PO TABS: 40.0000 mg | ORAL_TABLET | Freq: Every day | ORAL | Status: DC

## 2015-02-05 NOTE — Telephone Encounter (Signed)
Patient called to request refills on meds

## 2015-02-11 ENCOUNTER — Encounter (HOSPITAL_COMMUNITY): Payer: Self-pay

## 2015-02-11 ENCOUNTER — Ambulatory Visit (HOSPITAL_COMMUNITY)
Admission: RE | Admit: 2015-02-11 | Discharge: 2015-02-11 | Disposition: A | Discharge status: Home / Self Care | Payer: BC Managed Care – PPO | Source: Ambulatory Visit | Attending: Cardiology | Admitting: Cardiology

## 2015-02-11 VITALS — BP 142/90 | HR 83 | Wt 333.4 lb

## 2015-02-11 DIAGNOSIS — I5032 Chronic diastolic (congestive) heart failure: Secondary | ICD-10-CM

## 2015-02-11 DIAGNOSIS — E669 Obesity, unspecified: Secondary | ICD-10-CM | POA: Diagnosis not present

## 2015-02-11 DIAGNOSIS — I480 Paroxysmal atrial fibrillation: Secondary | ICD-10-CM | POA: Diagnosis not present

## 2015-02-11 DIAGNOSIS — E213 Hyperparathyroidism, unspecified: Secondary | ICD-10-CM | POA: Diagnosis not present

## 2015-02-11 DIAGNOSIS — R0683 Snoring: Secondary | ICD-10-CM | POA: Insufficient documentation

## 2015-02-11 DIAGNOSIS — I110 Hypertensive heart disease with heart failure: Secondary | ICD-10-CM | POA: Insufficient documentation

## 2015-02-11 DIAGNOSIS — I2510 Atherosclerotic heart disease of native coronary artery without angina pectoris: Secondary | ICD-10-CM | POA: Insufficient documentation

## 2015-02-11 LAB — BASIC METABOLIC PANEL
ANION GAP: 12 (ref 5–15)
BUN: 17 mg/dL (ref 6–20)
CALCIUM: 9.3 mg/dL (ref 8.9–10.3)
CO2: 28 mmol/L (ref 22–32)
Chloride: 103 mmol/L (ref 101–111)
Creatinine, Ser: 0.93 mg/dL (ref 0.44–1.00)
Glucose, Bld: 148 mg/dL — ABNORMAL HIGH (ref 65–99)
POTASSIUM: 4.6 mmol/L (ref 3.5–5.1)
Sodium: 143 mmol/L (ref 135–145)

## 2015-02-11 MED ORDER — LISINOPRIL 10 MG PO TABS: 10.0000 mg | ORAL_TABLET | Freq: Every day | ORAL | Status: DC

## 2015-02-11 NOTE — Progress Notes (Signed)
Patient ID: Kathleen [name], female   DOB: 30-Mar-1955, 60 y.o.   MRN: [id number]   Primary HF MD: Dr [name] HPI Kathleen [name] is a 74 y/o woman (mother of Kathleen [name] - CCU nurse) with a h/o morbid obesity, chronic diastolic HF, HTN, HL and CAD. Sleep study 11/2013 was negative. Also has history of A fib.    Had NSTEMI in 2/09 had cath at that time with occlusion of septal perforator. Otherwise normal arteries. Was admitted for CP in 11/13. CE normal. Underwent dobutamine stress echo which was normal.   Admitted 09/11/14 for Afib RVR. She converted to sinus tach with dilt drip and was transitioned to Toprol-XL 25 mg daily. She was in NSR at the time of discharge. Discharge weight was 325 lb.  She returns for follow up. Overall feeling ok. Denies SOB/PND/Orthopnea. No BRBPR. Not weighing at home. Not taking potassium or lisinopril but taking other meds. Not exercising. Not following low salt diet. Working full time as Pharmacist, hospital.   Echo 11/13 EF 60-65% Mild LAE. RV normal. No PH. No comment on diastolic function.  ECHO 09/04/13 EF 55-60%  Grade I DD  Labs:  09/25/13 K 4.1 Creatinine 1.0 , LDL 79 10/21/2014: K 5.4 Creatinine 0.95   Allergies  Allergen Reactions  . Lamictal [Lamotrigine]     Rash   . Eggs Or Egg-Derived Products Hives    Can eat foods with cooked eggs; CANNOT handle when part of flu vaccine, etc.     Current Outpatient Prescriptions  Medication Sig Dispense Refill  . albuterol (PROVENTIL HFA;VENTOLIN HFA) 108 (90 BASE) MCG/ACT inhaler Inhale 2 puffs into the lungs every 6 (six) hours as needed for wheezing. 1 Inhaler 1  . ALPRAZolam (XANAX) 0.5 MG tablet Take 0.5 mg by mouth 2 (two) times daily as needed for anxiety.    . Cholecalciferol (VITAMIN D3) 5000 UNITS CAPS Take 5,000 Units by mouth daily.    . Cranberry-Cholecalciferol 4200-500 MG-UNIT CAPS Take 1 capsule by mouth at bedtime.    . divalproex (DEPAKOTE ER) 500 MG 24 hr tablet Take 1,500 mg by mouth at bedtime.     [name]  doxycycline (VIBRA-TABS) 100 MG tablet Take 1 tablet (100 mg total) by mouth 2 (two) times daily. 20 tablet 0  . furosemide (LASIX) 20 MG tablet Take 2 tablets (40 mg total) by mouth daily. 60 tablet 3  . lurasidone (LATUDA) 40 MG TABS tablet Take 40 mg by mouth at bedtime.     . metoprolol succinate (TOPROL-XL) 25 MG 24 hr tablet TAKE 2 TABLETS (50 MG TOTAL) BY MOUTH AT BEDTIME. 60 tablet 3  . nitroGLYCERIN (NITROSTAT) 0.4 MG SL tablet Place 1 tablet (0.4 mg total) under the tongue every 5 (five) minutes as needed for chest pain. For chest pain 25 tablet 3  . OLANZapine (ZYPREXA) 10 MG tablet Take 10 mg by mouth at bedtime.    . rivaroxaban (XARELTO) 20 MG TABS tablet TAKE 1 TABLET BY MOUTH EVERY DAY WITH SUPPER 30 tablet 3  . simvastatin (ZOCOR) 20 MG tablet TAKE 1 TABLET BY MOUTH EVERY DAY AT BEDTIME 90 tablet 0  . SUPER B COMPLEX/C PO Take 1 capsule by mouth daily.    [name] lisinopril (ZESTRIL) 10 MG tablet Take 1 tablet (10 mg total) by mouth daily. 90 tablet 3   No current facility-administered medications for this encounter.    Past Medical History  Diagnosis Date  . Heart attack ([facility]) 02/2007    non-q-wave with second septal  perforator  . Hypertension   . Depression   . Dyslipidemia   . Psychiatric hospitalization 05/2008  . Asthma   . Coronary artery disease   . Anxiety   . Morbid obesity with BMI of 40.0-44.9, adult ([facility])   . Hyperlipidemia   . Knee pain, left   . PONV (postoperative nausea and vomiting)   . Lower extremity deep venous thrombosis ([facility])     20 years ago   . CHF (congestive heart failure) ([facility])     pt seen at heart/vascular spec clinic 08/14/2014   . Shortness of breath dyspnea     exercise   . Pneumonia     hx of   . Bronchitis     hx of   . Fall   . Urinary tract bacterial infections   . Urinary incontinence   . Cancer ([facility])     basal cell carcinoma on right hand   . Anemia   . Shingles     20 years ago   . Measles     hx of in childhood   . Atrial  fibrillation [facility])     Past Surgical History  Procedure Laterality Date  . Breast mass excision      age 30, benign tumor  . Knee surgery      right, removed cartilage  . Tubal ligation    . Tonsillectomy    . Dilation and curettage of uterus    . Cardiac catheterization    . Parathyroidectomy N/A 08/25/2014    Procedure: PARATHYROIDECTOMY;  Surgeon: Armandina Gemma, MD;  Location: [facility];  Service: General;  Laterality: N/A;    ROS:  As stated in the HPI and negative for all other systems.  PHYSICAL EXAM BP 142/90 mmHg  Pulse 83  Wt 333 lb 6.4 oz (151.229 kg)  SpO2 95% GENERAL:  Obese, NAD.  HEENT:  Normal NECK:  Supple. JVP hard to see - but seems ok. Carotid upstroke brisk and symmetric, no bruits, no thyromegaly LUNGS:  Clear to auscultation bilaterally CHEST:  Unremarkable HEART:  PMI not displaced or sustained,S1 and S2 within normal limits, no S3, no S4, no clicks, no rubs, no murmurs appreciated ABD:  Obese. Soft NT/ND , no rebound, no guarding,  EXT:  2 plus pulses throughout,  No edema. No cyanosis no clubbing, chronic venous stasis changes with  Trace-1+ ankle edema bilaterally with trace pretibial edema.   PSYCH: Flat affect  ASSESSMENT AND PLAN 1. Chronic diastolic HF: Normal EF with grade I diastolic dysfunction on echo 09/13/14. NYHA III.  - Volume status ok. Continue lasix to 40 mg daily. She is not taking potassium.  I will check BMET and start  Potassium if needed.  2. PAF. 1st noted during hospital admit 8/16. CHADSVASC = 3. - Regular rate. Continue Toprol XL 50 daily -Continue Xarelto 20 mg dialy 3. Morbid obesity: Needs to lose weight. Encouraged to cut back on portions and increase activity.   4. CAD: She will continue ASA 81, Plavix, statin.   5. HTN: Elevated but has not been taking lisinopril. Restart lisinopril 10 mg today. Check BMET today and in 10 day.   6. Snoring: Had sleep study. No sleep apnea.  7. Hyperparathyroidism-  Had partial  parathyroidectomy in August   Follow up in 3 months.  [name] Clegg NP-C  02/11/2015

## 2015-02-11 NOTE — Patient Instructions (Signed)
Restart lisinopril 10 mg one tab daily  Labs today and again in 10-14 days  Your physician recommends that you schedule a follow-up appointment in: 3 months  Do the following things EVERYDAY: 1) Weigh yourself in the morning before breakfast. Write it down and keep it in a log. 2) Take your medicines as prescribed 3) Eat low salt foods-Limit salt (sodium) to 2000 mg per day.  4) Stay as active as you can everyday 5) Limit all fluids for the day to less than 2 liters 6)

## 2015-02-22 ENCOUNTER — Ambulatory Visit (HOSPITAL_COMMUNITY)
Admission: RE | Admit: 2015-02-22 | Discharge: 2015-02-22 | Disposition: A | Discharge status: Home / Self Care | Payer: BC Managed Care – PPO | Source: Ambulatory Visit | Attending: Cardiology | Admitting: Cardiology

## 2015-02-22 DIAGNOSIS — I5032 Chronic diastolic (congestive) heart failure: Secondary | ICD-10-CM | POA: Diagnosis not present

## 2015-02-22 LAB — BASIC METABOLIC PANEL
Anion gap: 16 — ABNORMAL HIGH (ref 5–15)
BUN: 17 mg/dL (ref 6–20)
CHLORIDE: 99 mmol/L — AB (ref 101–111)
CO2: 30 mmol/L (ref 22–32)
CREATININE: 1.08 mg/dL — AB (ref 0.44–1.00)
Calcium: 9.6 mg/dL (ref 8.9–10.3)
GFR calc non Af Amer: 55 mL/min — ABNORMAL LOW (ref >60)
Glucose, Bld: 178 mg/dL — ABNORMAL HIGH (ref 65–99)
Potassium: 4.4 mmol/L (ref 3.5–5.1)
SODIUM: 145 mmol/L (ref 135–145)

## 2015-03-09 ENCOUNTER — Other Ambulatory Visit: Payer: Self-pay | Admitting: Family Medicine

## 2015-03-09 NOTE — Telephone Encounter (Signed)
Medication filled to pharmacy as requested.   

## 2015-03-26 ENCOUNTER — Other Ambulatory Visit (HOSPITAL_COMMUNITY): Payer: Self-pay | Admitting: *Deleted

## 2015-03-26 MED ORDER — METOPROLOL SUCCINATE ER 25 MG PO TB24: ORAL_TABLET | ORAL | Status: DC

## 2015-03-30 ENCOUNTER — Telehealth (HOSPITAL_COMMUNITY): Payer: Self-pay | Admitting: Cardiology

## 2015-03-30 ENCOUNTER — Other Ambulatory Visit (HOSPITAL_COMMUNITY): Payer: Self-pay | Admitting: Pharmacist

## 2015-03-30 MED ORDER — METOPROLOL SUCCINATE ER 50 MG PO TB24: 50.0000 mg | ORAL_TABLET | Freq: Every day | ORAL | Status: DC

## 2015-03-30 MED ORDER — METOPROLOL SUCCINATE ER 25 MG PO TB24: ORAL_TABLET | ORAL | Status: DC

## 2015-03-30 NOTE — Telephone Encounter (Signed)
Patient called to request refill on metoprolol with increase dose

## 2015-03-30 NOTE — Telephone Encounter (Signed)
Insurance prefers patient to take one 50 mg tablet daily instead of two 25 mg tablets. Have switched to metoprolol succ 50 mg daily.   [name]. Velva Harman, PharmD, BCPS, CPP Clinical Pharmacist Pager: [phone] Phone: [phone] 03/30/2015 2:36 PM

## 2015-04-02 ENCOUNTER — Telehealth (HOSPITAL_COMMUNITY): Payer: Self-pay

## 2015-04-02 NOTE — Telephone Encounter (Signed)
Patient called c/o BP 170/90, SOB with exertion, weight up 4 lbs, and BLE. Denies CP, cough. Per Oda Kilts PA, advised to take extra 20 mg lasix next two days, then resume to normal daily dose of 20 mg once daily. Aware and agreeable.  Renee Pain

## 2015-04-12 ENCOUNTER — Other Ambulatory Visit (HOSPITAL_COMMUNITY): Payer: Self-pay | Admitting: *Deleted

## 2015-04-12 DIAGNOSIS — I5022 Chronic systolic (congestive) heart failure: Secondary | ICD-10-CM

## 2015-04-12 MED ORDER — FUROSEMIDE 20 MG PO TABS: 40.0000 mg | ORAL_TABLET | Freq: Every day | ORAL | Status: DC

## 2015-05-17 ENCOUNTER — Ambulatory Visit (HOSPITAL_COMMUNITY)
Admission: RE | Admit: 2015-05-17 | Discharge: 2015-05-17 | Disposition: A | Discharge status: Home / Self Care | Payer: BC Managed Care – PPO | Source: Ambulatory Visit | Attending: Cardiology | Admitting: Cardiology

## 2015-05-17 ENCOUNTER — Encounter (HOSPITAL_COMMUNITY): Payer: Self-pay | Admitting: Internal Medicine

## 2015-05-17 VITALS — BP 143/56 | HR 70 | Wt 319.8 lb

## 2015-05-17 DIAGNOSIS — I110 Hypertensive heart disease with heart failure: Secondary | ICD-10-CM | POA: Diagnosis not present

## 2015-05-17 DIAGNOSIS — Z6841 Body Mass Index (BMI) 40.0 and over, adult: Secondary | ICD-10-CM | POA: Diagnosis not present

## 2015-05-17 DIAGNOSIS — J45909 Unspecified asthma, uncomplicated: Secondary | ICD-10-CM | POA: Insufficient documentation

## 2015-05-17 DIAGNOSIS — I2510 Atherosclerotic heart disease of native coronary artery without angina pectoris: Secondary | ICD-10-CM | POA: Insufficient documentation

## 2015-05-17 DIAGNOSIS — I5032 Chronic diastolic (congestive) heart failure: Secondary | ICD-10-CM | POA: Diagnosis present

## 2015-05-17 DIAGNOSIS — I252 Old myocardial infarction: Secondary | ICD-10-CM | POA: Diagnosis not present

## 2015-05-17 DIAGNOSIS — E213 Hyperparathyroidism, unspecified: Secondary | ICD-10-CM | POA: Diagnosis not present

## 2015-05-17 DIAGNOSIS — Z85828 Personal history of other malignant neoplasm of skin: Secondary | ICD-10-CM | POA: Insufficient documentation

## 2015-05-17 DIAGNOSIS — E669 Obesity, unspecified: Secondary | ICD-10-CM | POA: Diagnosis not present

## 2015-05-17 DIAGNOSIS — Z79899 Other long term (current) drug therapy: Secondary | ICD-10-CM | POA: Insufficient documentation

## 2015-05-17 DIAGNOSIS — Z888 Allergy status to other drugs, medicaments and biological substances status: Secondary | ICD-10-CM | POA: Diagnosis not present

## 2015-05-17 DIAGNOSIS — I480 Paroxysmal atrial fibrillation: Secondary | ICD-10-CM | POA: Insufficient documentation

## 2015-05-17 DIAGNOSIS — E785 Hyperlipidemia, unspecified: Secondary | ICD-10-CM | POA: Diagnosis not present

## 2015-05-17 DIAGNOSIS — I10 Essential (primary) hypertension: Secondary | ICD-10-CM | POA: Diagnosis not present

## 2015-05-17 DIAGNOSIS — Z86718 Personal history of other venous thrombosis and embolism: Secondary | ICD-10-CM | POA: Insufficient documentation

## 2015-05-17 DIAGNOSIS — Z7901 Long term (current) use of anticoagulants: Secondary | ICD-10-CM | POA: Diagnosis not present

## 2015-05-17 NOTE — Patient Instructions (Signed)
Follow up 6 months with Dr. Bensimhon.  Do the following things EVERYDAY: 1) Weigh yourself in the morning before breakfast. Write it down and keep it in a log. 2) Take your medicines as prescribed 3) Eat low salt foods-Limit salt (sodium) to 2000 mg per day.  4) Stay as active as you can everyday 5) Limit all fluids for the day to less than 2 liters  

## 2015-05-17 NOTE — Progress Notes (Signed)
Patient ID: Kathleen [name], female   DOB: 1955/07/16, 60 y.o.   MRN: [id number]   Primary HF MD: Dr [name]  HPI Kathleen [name] is a 60 y/o woman (mother of [name] - CCU nurse) with a h/o morbid obesity, chronic diastolic HF, HTN, HL and CAD. Sleep study 11/2013 was negative. Also has history of A fib.    Had NSTEMI in 2/09 had cath at that time with occlusion of septal perforator. Otherwise normal arteries. Was admitted for CP in 11/13. CE normal. Underwent dobutamine stress echo which was normal.   Admitted 09/11/14 for Afib RVR. She converted to sinus tach with dilt drip and was transitioned to Toprol-XL 25 mg daily. She was in NSR at the time of discharge. Discharge weight was 325 lb.  She returns for follow up. Overall feeling ok. Denies SOB/PND/Orthopnea. No BRBPR. Weight at home has been going down to 316 pounds. Only takes meds at bed time. Following low carb diet. Started walking again. Working full time as Pharmacist, hospital.   Echo 11/13 EF 60-65% Mild LAE. RV normal. No PH. No comment on diastolic function.  ECHO 09/04/13 EF 55-60%  Grade I DD  Labs:  09/25/13 K 4.1 Creatinine 1.0 , LDL 79 10/21/2014: K 5.4 Creatinine 0.95  02/22/2015: K 4.4 Creatinine 1.08   Allergies  Allergen Reactions  . Lamictal [Lamotrigine]     Rash   . Eggs Or Egg-Derived Products Hives    Can eat foods with cooked eggs; CANNOT handle when part of flu vaccine, etc.     Current Outpatient Prescriptions  Medication Sig Dispense Refill  . albuterol (PROVENTIL HFA;VENTOLIN HFA) 108 (90 BASE) MCG/ACT inhaler Inhale 2 puffs into the lungs every 6 (six) hours as needed for wheezing. 1 Inhaler 1  . ALPRAZolam (XANAX) 0.5 MG tablet Take 0.5 mg by mouth 2 (two) times daily as needed for anxiety.    . divalproex (DEPAKOTE ER) 500 MG 24 hr tablet Take 1,500 mg by mouth at bedtime.     . furosemide (LASIX) 20 MG tablet Take 2 tablets (40 mg total) by mouth daily. 180 tablet 3  . lisinopril (ZESTRIL) 10 MG tablet Take 1  tablet (10 mg total) by mouth daily. 90 tablet 3  . lurasidone (LATUDA) 40 MG TABS tablet Take 40 mg by mouth at bedtime.     . metoprolol succinate (TOPROL-XL) 50 MG 24 hr tablet Take 1 tablet (50 mg total) by mouth daily. Take with or immediately following a meal. 30 tablet 6  . OLANZapine (ZYPREXA) 5 MG tablet Take 5 mg by mouth at bedtime.    . rivaroxaban (XARELTO) 20 MG TABS tablet TAKE 1 TABLET BY MOUTH EVERY DAY WITH SUPPER 30 tablet 3  . simvastatin (ZOCOR) 20 MG tablet TAKE 1 TABLET BY MOUTH EVERY DAY AT BEDTIME 90 tablet 0  . SUPER B COMPLEX/C PO Take 1 capsule by mouth daily.    . nitroGLYCERIN (NITROSTAT) 0.4 MG SL tablet Place 1 tablet (0.4 mg total) under the tongue every 5 (five) minutes as needed for chest pain. For chest pain (Patient not taking: Reported on 05/17/2015) 25 tablet 3   No current facility-administered medications for this encounter.    Past Medical History  Diagnosis Date  . Heart attack ([place]) 02/2007    non-q-wave with second septal perforator  . Hypertension   . Depression   . Dyslipidemia   . Psychiatric hospitalization 05/2008  . Asthma   . Coronary artery disease   . Anxiety   .  Morbid obesity with BMI of 40.0-44.9, adult ([place])   . Hyperlipidemia   . Knee pain, left   . PONV (postoperative nausea and vomiting)   . Lower extremity deep venous thrombosis (HCC)     20 years ago   . CHF (congestive heart failure) ([place])     pt seen at heart/vascular spec clinic 08/14/2014   . Shortness of breath dyspnea     exercise   . Pneumonia     hx of   . Bronchitis     hx of   . Fall   . Urinary tract bacterial infections   . Urinary incontinence   . Cancer ([place])     basal cell carcinoma on right hand   . Anemia   . Shingles     20 years ago   . Measles     hx of in childhood   . Atrial fibrillation [facility])     Past Surgical History  Procedure Laterality Date  . Breast mass excision      age 48, benign tumor  . Knee surgery      right, removed  cartilage  . Tubal ligation    . Tonsillectomy    . Dilation and curettage of uterus    . Cardiac catheterization    . Parathyroidectomy N/A 08/25/2014    Procedure: PARATHYROIDECTOMY;  Surgeon: Armandina Gemma, MD;  Location: [facility] ORS;  Service: General;  Laterality: N/A;    ROS:  As stated in the HPI and negative for all other systems.  PHYSICAL EXAM BP 143/56 mmHg  Pulse 70  Wt 319 lb 12.8 oz (145.06 kg)  SpO2 98% GENERAL:  Obese, NAD.  HEENT:  Normal NECK:  Supple. JVP hard to see - but seems ok. Carotid upstroke brisk and symmetric, no bruits, no thyromegaly LUNGS:  Clear to auscultation bilaterally CHEST:  Unremarkable HEART:  PMI not displaced or sustained,S1 and S2 within normal limits, no S3, no S4, no clicks, no rubs, no murmurs appreciated ABD:  Obese. Soft NT/ND , no rebound, no guarding,  EXT:  2 plus pulses throughout,  No edema. No cyanosis no clubbing, chronic venous stasis changes with  Trace edema bilaterally with trace pretibial edema.   PSYCH: Flat affect  ASSESSMENT AND PLAN 1. Chronic diastolic HF: Normal EF with grade I diastolic dysfunction on echo 09/13/14. NYHA II-III- Volume status ok. Continue lasix to 40 mg daily. .  2. PAF. 1st noted during hospital admit 8/16. CHADSVASC = 3. - Regular rate. Continue Toprol XL 50 daily -Continue Xarelto 20 mg dialy 3. Morbid obesity: Congratulated on weight loss. Encouraged to cut back on portions and increase activity.   4. CAD: No evidence of ischemia. She will continue ASA 81, Plavix, statin.   5. HTN:  A little elevated but has not had meds today. Continue current regimen.   6. Snoring: Had sleep study. No sleep apnea.  7. Hyperparathyroidism-  Had partial parathyroidectomy in August   Follow up in 6 months.  Jovanne Riggenbach NP-C  05/17/2015

## 2015-06-22 ENCOUNTER — Ambulatory Visit (INDEPENDENT_AMBULATORY_CARE_PROVIDER_SITE_OTHER): Payer: BC Managed Care – PPO | Admitting: Physician Assistant

## 2015-06-22 ENCOUNTER — Encounter: Payer: Self-pay | Admitting: Physician Assistant

## 2015-06-22 VITALS — BP 136/82 | HR 72 | Temp 98.2°F | Resp 20 | Ht 71.0 in | Wt 321.2 lb

## 2015-06-22 DIAGNOSIS — J019 Acute sinusitis, unspecified: Secondary | ICD-10-CM | POA: Diagnosis not present

## 2015-06-22 DIAGNOSIS — B9689 Other specified bacterial agents as the cause of diseases classified elsewhere: Secondary | ICD-10-CM

## 2015-06-22 MED ORDER — BENZONATATE 100 MG PO CAPS: 100.0000 mg | ORAL_CAPSULE | Freq: Three times a day (TID) | ORAL | Status: DC | PRN

## 2015-06-22 MED ORDER — AMOXICILLIN-POT CLAVULANATE 875-125 MG PO TABS: 1.0000 | ORAL_TABLET | Freq: Two times a day (BID) | ORAL | Status: DC
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
## 2015-06-22 NOTE — Patient Instructions (Signed)
Please take antibiotic as directed.  Increase fluid intake.  Use Saline nasal spray.  Take a daily multivitamin. Use the Tessalon as directed for cough. You can use over-the-counter Delsym instead if you would like.  Place a humidifier in the bedroom.  Please call or return clinic if symptoms are not improving.  Sinusitis Sinusitis is redness, soreness, and swelling (inflammation) of the paranasal sinuses. Paranasal sinuses are air pockets within the bones of your face (beneath the eyes, the middle of the forehead, or above the eyes). In healthy paranasal sinuses, mucus is able to drain out, and air is able to circulate through them by way of your nose. However, when your paranasal sinuses are inflamed, mucus and air can become trapped. This can allow bacteria and other germs to grow and cause infection. Sinusitis can develop quickly and last only a short time (acute) or continue over a long period (chronic). Sinusitis that lasts for more than 12 weeks is considered chronic.  CAUSES  Causes of sinusitis include:  Allergies.  Structural abnormalities, such as displacement of the cartilage that separates your nostrils (deviated septum), which can decrease the air flow through your nose and sinuses and affect sinus drainage.  Functional abnormalities, such as when the small hairs (cilia) that line your sinuses and help remove mucus do not work properly or are not present. SYMPTOMS  Symptoms of acute and chronic sinusitis are the same. The primary symptoms are pain and pressure around the affected sinuses. Other symptoms include:  Upper toothache.  Earache.  Headache.  Bad breath.  Decreased sense of smell and taste.  A cough, which worsens when you are lying flat.  Fatigue.  Fever.  Thick drainage from your nose, which often is green and may contain pus (purulent).  Swelling and warmth over the affected sinuses. DIAGNOSIS  Your caregiver will perform a physical exam. During the exam,  your caregiver may:  Look in your nose for signs of abnormal growths in your nostrils (nasal polyps).  Tap over the affected sinus to check for signs of infection.  View the inside of your sinuses (endoscopy) with a special imaging device with a light attached (endoscope), which is inserted into your sinuses. If your caregiver suspects that you have chronic sinusitis, one or more of the following tests may be recommended:  Allergy tests.  Nasal culture A sample of mucus is taken from your nose and sent to a lab and screened for bacteria.  Nasal cytology A sample of mucus is taken from your nose and examined by your caregiver to determine if your sinusitis is related to an allergy. TREATMENT  Most cases of acute sinusitis are related to a viral infection and will resolve on their own within 10 days. Sometimes medicines are prescribed to help relieve symptoms (pain medicine, decongestants, nasal steroid sprays, or saline sprays).  However, for sinusitis related to a bacterial infection, your caregiver will prescribe antibiotic medicines. These are medicines that will help kill the bacteria causing the infection.  Rarely, sinusitis is caused by a fungal infection. In theses cases, your caregiver will prescribe antifungal medicine. For some cases of chronic sinusitis, surgery is needed. Generally, these are cases in which sinusitis recurs more than 3 times per year, despite other treatments. HOME CARE INSTRUCTIONS   Drink plenty of water. Water helps thin the mucus so your sinuses can drain more easily.  Use a humidifier.  Inhale steam 3 to 4 times a day (for example, sit in the bathroom with the  shower running).  Apply a warm, moist washcloth to your face 3 to 4 times a day, or as directed by your caregiver.  Use saline nasal sprays to help moisten and clean your sinuses.  Take over-the-counter or prescription medicines for pain, discomfort, or fever only as directed by your  caregiver. SEEK IMMEDIATE MEDICAL CARE IF:  You have increasing pain or severe headaches.  You have nausea, vomiting, or drowsiness.  You have swelling around your face.  You have vision problems.  You have a stiff neck.  You have difficulty breathing. MAKE SURE YOU:   Understand these instructions.  Will watch your condition.  Will get help right away if you are not doing well or get worse. Document Released: 01/09/2005 Document Revised: 04/03/2011 Document Reviewed: 01/24/2011 [facility] Patient Information 2014 [place], [place].

## 2015-06-22 NOTE — Progress Notes (Signed)
Pre visit review using our clinic review tool, if applicable. No additional management support is needed unless otherwise documented below in the visit note/SLS  

## 2015-08-04 ENCOUNTER — Emergency Department (HOSPITAL_BASED_OUTPATIENT_CLINIC_OR_DEPARTMENT_OTHER): Payer: BC Managed Care – PPO

## 2015-08-04 ENCOUNTER — Encounter (HOSPITAL_BASED_OUTPATIENT_CLINIC_OR_DEPARTMENT_OTHER): Payer: Self-pay | Admitting: *Deleted

## 2015-08-04 ENCOUNTER — Observation Stay (HOSPITAL_BASED_OUTPATIENT_CLINIC_OR_DEPARTMENT_OTHER)
Admission: EM | Admit: 2015-08-04 | Discharge: 2015-08-06 | Disposition: A | Discharge status: Home / Self Care | Payer: BC Managed Care – PPO | Attending: Cardiovascular Disease | Admitting: Cardiovascular Disease

## 2015-08-04 DIAGNOSIS — I5032 Chronic diastolic (congestive) heart failure: Secondary | ICD-10-CM | POA: Diagnosis not present

## 2015-08-04 DIAGNOSIS — Z7901 Long term (current) use of anticoagulants: Secondary | ICD-10-CM | POA: Insufficient documentation

## 2015-08-04 DIAGNOSIS — E785 Hyperlipidemia, unspecified: Secondary | ICD-10-CM | POA: Diagnosis present

## 2015-08-04 DIAGNOSIS — R079 Chest pain, unspecified: Secondary | ICD-10-CM | POA: Diagnosis not present

## 2015-08-04 DIAGNOSIS — I2510 Atherosclerotic heart disease of native coronary artery without angina pectoris: Secondary | ICD-10-CM | POA: Diagnosis present

## 2015-08-04 DIAGNOSIS — R0602 Shortness of breath: Secondary | ICD-10-CM | POA: Insufficient documentation

## 2015-08-04 DIAGNOSIS — F319 Bipolar disorder, unspecified: Secondary | ICD-10-CM | POA: Diagnosis present

## 2015-08-04 DIAGNOSIS — Z79899 Other long term (current) drug therapy: Secondary | ICD-10-CM | POA: Insufficient documentation

## 2015-08-04 DIAGNOSIS — I110 Hypertensive heart disease with heart failure: Secondary | ICD-10-CM | POA: Insufficient documentation

## 2015-08-04 DIAGNOSIS — I252 Old myocardial infarction: Secondary | ICD-10-CM | POA: Insufficient documentation

## 2015-08-04 DIAGNOSIS — I480 Paroxysmal atrial fibrillation: Secondary | ICD-10-CM | POA: Diagnosis not present

## 2015-08-04 DIAGNOSIS — I10 Essential (primary) hypertension: Secondary | ICD-10-CM | POA: Diagnosis present

## 2015-08-04 DIAGNOSIS — Z86718 Personal history of other venous thrombosis and embolism: Secondary | ICD-10-CM | POA: Diagnosis not present

## 2015-08-04 DIAGNOSIS — R0789 Other chest pain: Secondary | ICD-10-CM | POA: Diagnosis present

## 2015-08-04 LAB — CBC WITH DIFFERENTIAL/PLATELET
BASOS ABS: 0 10*3/uL (ref 0.0–0.1)
BASOS PCT: 0 %
Eosinophils Absolute: 0.1 10*3/uL (ref 0.0–0.7)
Eosinophils Relative: 1 %
HEMATOCRIT: 44.8 % (ref 36.0–46.0)
Hemoglobin: 15.3 g/dL — ABNORMAL HIGH (ref 12.0–15.0)
LYMPHS PCT: 23 %
Lymphs Abs: 1.5 10*3/uL (ref 0.7–4.0)
MCH: 30.2 pg (ref 26.0–34.0)
MCHC: 34.2 g/dL (ref 30.0–36.0)
MCV: 88.4 fL (ref 78.0–100.0)
Monocytes Absolute: 0.6 10*3/uL (ref 0.1–1.0)
Monocytes Relative: 9 %
NEUTROS ABS: 4.5 10*3/uL (ref 1.7–7.7)
NEUTROS PCT: 67 %
PLATELETS: 144 10*3/uL — AB (ref 150–400)
RBC: 5.07 MIL/uL (ref 3.87–5.11)
RDW: 13.7 % (ref 11.5–15.5)
WBC: 6.7 10*3/uL (ref 4.0–10.5)

## 2015-08-04 LAB — URINE MICROSCOPIC-ADD ON

## 2015-08-04 LAB — BASIC METABOLIC PANEL
ANION GAP: 10 (ref 5–15)
BUN: 21 mg/dL — ABNORMAL HIGH (ref 6–20)
CALCIUM: 9 mg/dL (ref 8.9–10.3)
CO2: 27 mmol/L (ref 22–32)
Chloride: 101 mmol/L (ref 101–111)
Creatinine, Ser: 1.02 mg/dL — ABNORMAL HIGH (ref 0.44–1.00)
GFR, EST NON AFRICAN AMERICAN: 59 mL/min — AB (ref >60)
Glucose, Bld: 124 mg/dL — ABNORMAL HIGH (ref 65–99)
POTASSIUM: 3.9 mmol/L (ref 3.5–5.1)
SODIUM: 138 mmol/L (ref 135–145)

## 2015-08-04 LAB — TROPONIN I

## 2015-08-04 LAB — URINALYSIS, ROUTINE W REFLEX MICROSCOPIC
BILIRUBIN URINE: NEGATIVE
Glucose, UA: NEGATIVE mg/dL
HGB URINE DIPSTICK: NEGATIVE
Ketones, ur: 15 mg/dL — AB
NITRITE: NEGATIVE
PROTEIN: NEGATIVE mg/dL
SPECIFIC GRAVITY, URINE: 1.027 (ref 1.005–1.030)
pH: 5.5 (ref 5.0–8.0)

## 2015-08-04 LAB — D-DIMER, QUANTITATIVE (NOT AT ARMC): D DIMER QUANT: 0.52 ug{FEU}/mL — AB (ref 0.00–0.50)

## 2015-08-04 LAB — BRAIN NATRIURETIC PEPTIDE: B Natriuretic Peptide: 26.4 pg/mL (ref 0.0–100.0)

## 2015-08-04 MED ORDER — ASPIRIN 81 MG PO CHEW
324.0000 mg | CHEWABLE_TABLET | Freq: Once | ORAL | Status: AC
  Administered 2015-08-04: 324 mg via ORAL
  Filled 2015-08-04: qty 4

## 2015-08-04 MED ORDER — NITROGLYCERIN 0.4 MG SL SUBL
0.4000 mg | SUBLINGUAL_TABLET | SUBLINGUAL | Status: DC | PRN
  Administered 2015-08-04 (×3): 0.4 mg via SUBLINGUAL
  Filled 2015-08-04: qty 1

## 2015-08-04 NOTE — ED Notes (Signed)
Pt transferred to [facility] via [facility]

## 2015-08-04 NOTE — ED Notes (Signed)
MD at bedside. 

## 2015-08-04 NOTE — ED Notes (Signed)
Pt describes pressure and squeezing to left chest onset 9a while sitting at home. No associated s/s. MD in with pt. Pt placed on monitor with q15 min VS showing NSR. VSS.

## 2015-08-04 NOTE — ED Notes (Signed)
Attempted to call report. Nurse in report and will call back.
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
## 2015-08-04 NOTE — ED Notes (Signed)
ASSISTED TO [facility] WITHOUT DIFFICULTY

## 2015-08-04 NOTE — ED Provider Notes (Signed)
CSN: [id number]     Arrival date & time 08/04/15  1514 History   First MD Initiated Contact with Patient 08/04/15 1533     Chief Complaint  Patient presents with  . Chest Pain     (Consider location/radiation/quality/duration/timing/severity/associated sxs/prior Treatment) HPI Comments: 60yo w/ PMH including MI, CHF, A fib on Xarelto, DVT who p/w chest pain. At approximately 9am, the patient was at rest when she began having left-sided chest pressure that is nonradiating, intermittent and lasts a few minutes at a time, and has been associated with shortness of breath, diaphoresis, and nausea. Pain is currently 5/10 in intensity. She has had some diarrhea recently but no vomiting. She reports a chronic dry cough at night but no other cold symptoms. She states she has had similar chest pressure previously when she had her heart attack which is why she was worried. She denies any significant lower extremity edema or weight gain. No sudden ripping or tearing chest pain. No recent changes to her medications. She is compliant with her Xarelto. No recent travel. No meds PTA.  Patient is a 60 y.o. female presenting with chest pain. The history is provided by the patient.  Chest Pain   Past Medical History  Diagnosis Date  . Heart attack ([place]) 02/2007    non-q-wave with second septal perforator  . Hypertension   . Depression   . Dyslipidemia   . Psychiatric hospitalization 05/2008  . Asthma   . Coronary artery disease   . Anxiety   . Morbid obesity with BMI of 40.0-44.9, adult ([place])   . Hyperlipidemia   . Knee pain, left   . PONV (postoperative nausea and vomiting)   . Lower extremity deep venous thrombosis (HCC)     20 years ago   . CHF (congestive heart failure) ([place])     pt seen at heart/vascular spec clinic 08/14/2014   . Shortness of breath dyspnea     exercise   . Pneumonia     hx of   . Bronchitis     hx of   . Fall   . Urinary tract bacterial infections   . Urinary incontinence    . Cancer ([place])     basal cell carcinoma on right hand   . Anemia   . Shingles     20 years ago   . Measles     hx of in childhood   . Atrial fibrillation [facility])    Past Surgical History  Procedure Laterality Date  . Breast mass excision      age 34, benign tumor  . Knee surgery      right, removed cartilage  . Tubal ligation    . Tonsillectomy    . Dilation and curettage of uterus    . Cardiac catheterization    . Parathyroidectomy N/A 08/25/2014    Procedure: PARATHYROIDECTOMY;  Surgeon: Armandina Gemma, [place];  Location: [facility];  Service: General;  Laterality: N/A;   Family History  Problem Relation Age of Onset  . Breast cancer Mother 64  . Alcohol abuse Mother   . Alcohol abuse Father   . Alcohol abuse Maternal Grandfather   . Drug abuse Maternal Grandfather   . Alcohol abuse Maternal Grandmother   . Alcohol abuse Paternal Grandfather   . Alcohol abuse Paternal Grandmother   . Schizophrenia Cousin    Social History  Substance Use Topics  . Smoking status: Never Smoker   . Smokeless tobacco: Never Used  . Alcohol Use: 0.0  oz/week    0 Glasses of wine per week     Comment: occ.   OB History    No data available     Review of Systems  Cardiovascular: Positive for chest pain.   10 Systems reviewed and are negative for acute change except as noted in the HPI.    Allergies  Lamictal and Eggs or egg-derived products  Home Medications   Prior to Admission medications   Medication Sig Start Date End Date Taking? Authorizing Provider  albuterol (PROVENTIL HFA;VENTOLIN HFA) 108 (90 BASE) MCG/ACT inhaler Inhale 2 puffs into the lungs every 6 (six) hours as needed for wheezing. 11/12/12  Yes Yvonne R Lowne Chase, DO  divalproex (DEPAKOTE ER) 500 MG 24 hr tablet Take 1,500 mg by mouth at bedtime.    Yes Historical Provider, [place]  furosemide (LASIX) 20 MG tablet Take 2 tablets (40 mg total) by mouth daily. 04/12/15  Yes Shaune Pascal Bensimhon, MD  lisinopril (ZESTRIL) 10 MG  tablet Take 1 tablet (10 mg total) by mouth daily. 02/11/15  Yes [name] D Clegg, NP  lurasidone (LATUDA) 40 MG TABS tablet Take 40 mg by mouth at bedtime.    Yes Historical Provider, [place]  metoprolol succinate (TOPROL-XL) 50 MG 24 hr tablet Take 1 tablet (50 mg total) by mouth daily. Take with or immediately following a meal. 03/30/15  Yes Jolaine Artist, MD  nitroGLYCERIN (NITROSTAT) 0.4 MG SL tablet Place 1 tablet (0.4 mg total) under the tongue every 5 (five) minutes as needed for chest pain. For chest pain 12/07/11  Yes Rhonda G Barrett, PA-C  OLANZapine (ZYPREXA) 5 MG tablet Take 5 mg by mouth at bedtime.   Yes Historical Provider, [place]  rivaroxaban (XARELTO) 20 MG TABS tablet TAKE 1 TABLET BY MOUTH EVERY DAY WITH SUPPER 10/13/14  Yes Larey Dresser, MD  simvastatin (ZOCOR) 20 MG tablet TAKE 1 TABLET BY MOUTH EVERY DAY AT BEDTIME 03/09/15  Yes Midge Minium, MD  SUPER B COMPLEX/C PO Take 1 capsule by mouth daily.   Yes Historical Provider, [place]  ALPRAZolam Duanne Moron) 0.5 MG tablet Take 0.5 mg by mouth 2 (two) times daily as needed for anxiety.    Historical Provider, [place]  amoxicillin-clavulanate (AUGMENTIN) 875-125 MG tablet Take 1 tablet by mouth 2 (two) times daily. 06/22/15   Brunetta Jeans, PA-C  benzonatate (TESSALON) 100 MG capsule Take 1 capsule (100 mg total) by mouth 3 (three) times daily as needed. 06/22/15   Brunetta Jeans, PA-C   BP 139/75 mmHg  Pulse 84  Temp(Src) 98.2 F (36.8 C) (Oral)  Resp 22  Ht 6' (1.829 m)  Wt 325 lb (147.419 kg)  BMI 44.07 kg/m2  SpO2 98% Physical Exam  Constitutional: She is oriented to person, place, and time. She appears well-developed and well-nourished. No distress.  HENT:  Head: Normocephalic and atraumatic.  Moist mucous membranes  Eyes: Conjunctivae are normal.  Neck: Neck supple.  Cardiovascular: Normal rate, regular rhythm and normal heart sounds.   No murmur heard. Pulmonary/Chest: Effort normal and breath sounds normal.  Abdominal: Soft.  Bowel sounds are normal. She exhibits no distension. There is no tenderness.  Musculoskeletal: She exhibits edema.  1+ pitting edema BLE  Neurological: She is alert and oriented to person, place, and time.  Fluent speech  Skin: Skin is warm and dry.  Psychiatric: She has a normal mood and affect. Judgment normal.  Nursing note and vitals reviewed.   ED Course  Procedures (including critical  care time) Labs Review Labs Reviewed  BASIC METABOLIC PANEL - Abnormal; Notable for the following:    Glucose, Bld 124 (*)    BUN 21 (*)    Creatinine, Ser 1.02 (*)    GFR calc non Af Amer 59 (*)    All other components within normal limits  CBC WITH DIFFERENTIAL/PLATELET - Abnormal; Notable for the following:    Hemoglobin 15.3 (*)    Platelets 144 (*)    All other components within normal limits  URINALYSIS, ROUTINE W REFLEX MICROSCOPIC (NOT AT [facility]) - Abnormal; Notable for the following:    Ketones, ur 15 (*)    Leukocytes, UA SMALL (*)    All other components within normal limits  D-DIMER, QUANTITATIVE (NOT AT [facility]) - Abnormal; Notable for the following:    D-Dimer, Quant 0.52 (*)    All other components within normal limits  URINE MICROSCOPIC-ADD ON - Abnormal; Notable for the following:    Squamous Epithelial / LPF 0-5 (*)    Bacteria, UA RARE (*)    All other components within normal limits  BRAIN NATRIURETIC PEPTIDE  TROPONIN I    Imaging Review Dg Chest 2 View  08/04/2015  CLINICAL DATA:  60 year old female with chest pain today. EXAM: CHEST  2 VIEW COMPARISON:  09/11/2014 and prior chest radiographs FINDINGS: The cardiomediastinal silhouette is unremarkable. Mild peribronchial thickening again noted. There is no evidence of focal airspace disease, pulmonary edema, suspicious pulmonary nodule/mass, pleural effusion, or pneumothorax. No acute bony abnormalities are identified. IMPRESSION: No active cardiopulmonary disease. Electronically Signed   By: Margarette Canada M.D.   On:  08/04/2015 15:58   I have personally reviewed and evaluated these lab results as part of my medical decision-making.   EKG Interpretation   Date/Time:  Wednesday August 04 2015 15:21:07 EDT Ventricular Rate:  90 PR Interval:  136 QRS Duration: 72 QT Interval:  368 QTC Calculation: 450 R Axis:   48 Text Interpretation:  Normal sinus rhythm Normal ECG No significant change  since last tracing Confirmed by Rashae Rother MD, Mitch Arquette [phone]) on 08/04/2015  3:31:06 PM     Medications  nitroGLYCERIN (NITROSTAT) SL tablet 0.4 mg (0.4 mg Sublingual Given 08/04/15 1626)  aspirin chewable tablet 324 mg (324 mg Oral Given 08/04/15 1619)    MDM   Final diagnoses:  Chest pain, unspecified chest pain type  Exertional shortness of breath    Pt w/ cardiac hx including MI and CHF who p/w Left-sided chest pressure that began this morning at rest and has been intermittent since that time, associated with nausea, shortness of breath, and diaphoresis. She was well-appearing and comfortable at presentation. Vital signs unremarkable. EKG unchanged from previous. Gave the patient aspirin and obtained above lab work. Chest x-ray was negative acute. Gave nitroglycerin which improved the patient's pain.  Troponin and BNP unremarkable. CBC and BMP reassuring.  D-dimer 0.52 which is below age-adjusted cutoff of normal. Given that the patient is compliant with Xarelto, I doubt PE. No sudden ripping or tearing chest pain or radiation to the back to suggest aortic dissection. I discussed the patient with cardiology, Dr. Sallyanne Kuster, who has recommended overnight admission for chest pain rule out. Patient in agreement with plan. Patient transferred to [facility] for further care.   Sharlett Iles, MD 08/04/15 2019

## 2015-08-04 NOTE — ED Notes (Signed)
Patient states she developed left chest pain this morning around 0900.  Describes pain as intermittent pressure and is associated with sob and diaphoresis.  History of MI approximately eight years ago.

## 2015-08-04 NOTE — ED Notes (Signed)
Patient transported to X-ray 

## 2015-08-05 ENCOUNTER — Observation Stay (HOSPITAL_BASED_OUTPATIENT_CLINIC_OR_DEPARTMENT_OTHER): Payer: BC Managed Care – PPO

## 2015-08-05 ENCOUNTER — Encounter (HOSPITAL_COMMUNITY): Payer: Self-pay | Admitting: Radiology

## 2015-08-05 ENCOUNTER — Observation Stay (HOSPITAL_COMMUNITY): Payer: BC Managed Care – PPO

## 2015-08-05 DIAGNOSIS — R0789 Other chest pain: Secondary | ICD-10-CM | POA: Diagnosis not present

## 2015-08-05 DIAGNOSIS — F319 Bipolar disorder, unspecified: Secondary | ICD-10-CM

## 2015-08-05 DIAGNOSIS — R079 Chest pain, unspecified: Secondary | ICD-10-CM | POA: Diagnosis not present

## 2015-08-05 DIAGNOSIS — R0602 Shortness of breath: Secondary | ICD-10-CM | POA: Diagnosis not present

## 2015-08-05 DIAGNOSIS — I5032 Chronic diastolic (congestive) heart failure: Secondary | ICD-10-CM | POA: Diagnosis not present

## 2015-08-05 DIAGNOSIS — I110 Hypertensive heart disease with heart failure: Secondary | ICD-10-CM | POA: Diagnosis not present

## 2015-08-05 DIAGNOSIS — I2510 Atherosclerotic heart disease of native coronary artery without angina pectoris: Secondary | ICD-10-CM | POA: Diagnosis not present

## 2015-08-05 LAB — BASIC METABOLIC PANEL
Anion gap: 11 (ref 5–15)
BUN: 15 mg/dL (ref 6–20)
CHLORIDE: 102 mmol/L (ref 101–111)
CO2: 27 mmol/L (ref 22–32)
Calcium: 8.9 mg/dL (ref 8.9–10.3)
Creatinine, Ser: 0.96 mg/dL (ref 0.44–1.00)
GFR calc Af Amer: >60 mL/min (ref >60)
GLUCOSE: 134 mg/dL — AB (ref 65–99)
POTASSIUM: 3.8 mmol/L (ref 3.5–5.1)
Sodium: 140 mmol/L (ref 135–145)

## 2015-08-05 LAB — COMPREHENSIVE METABOLIC PANEL
ALT: 19 U/L (ref 14–54)
ANION GAP: 8 (ref 5–15)
AST: 18 U/L (ref 15–41)
Albumin: 3.7 g/dL (ref 3.5–5.0)
Alkaline Phosphatase: 92 U/L (ref 38–126)
BUN: 16 mg/dL (ref 6–20)
CHLORIDE: 101 mmol/L (ref 101–111)
CO2: 28 mmol/L (ref 22–32)
Calcium: 9 mg/dL (ref 8.9–10.3)
Creatinine, Ser: 0.91 mg/dL (ref 0.44–1.00)
GFR calc Af Amer: >60 mL/min (ref >60)
Glucose, Bld: 146 mg/dL — ABNORMAL HIGH (ref 65–99)
POTASSIUM: 3.5 mmol/L (ref 3.5–5.1)
SODIUM: 137 mmol/L (ref 135–145)
Total Bilirubin: 0.8 mg/dL (ref 0.3–1.2)
Total Protein: 6.6 g/dL (ref 6.5–8.1)

## 2015-08-05 LAB — ECHOCARDIOGRAM COMPLETE
HEIGHTINCHES: 72 in
WEIGHTICAEL: 5219.2 [oz_av]

## 2015-08-05 LAB — PROTIME-INR
INR: 1.15 (ref 0.00–1.49)
Prothrombin Time: 14.9 seconds (ref 11.6–15.2)

## 2015-08-05 LAB — CBC
HCT: 41.8 % (ref 36.0–46.0)
Hemoglobin: 14 g/dL (ref 12.0–15.0)
MCH: 29.7 pg (ref 26.0–34.0)
MCHC: 33.5 g/dL (ref 30.0–36.0)
MCV: 88.7 fL (ref 78.0–100.0)
PLATELETS: 124 10*3/uL — AB (ref 150–400)
RBC: 4.71 MIL/uL (ref 3.87–5.11)
RDW: 13.6 % (ref 11.5–15.5)
WBC: 4.6 10*3/uL (ref 4.0–10.5)

## 2015-08-05 LAB — LIPID PANEL
CHOL/HDL RATIO: 3.6 ratio
Cholesterol: 158 mg/dL (ref 0–200)
HDL: 44 mg/dL (ref >40)
LDL CALC: 81 mg/dL (ref 0–99)
Triglycerides: 164 mg/dL — ABNORMAL HIGH (ref <150)
VLDL: 33 mg/dL (ref 0–40)

## 2015-08-05 LAB — TROPONIN I

## 2015-08-05 LAB — TSH: TSH: 5.897 u[IU]/mL — ABNORMAL HIGH (ref 0.350–4.500)

## 2015-08-05 LAB — APTT: aPTT: 29 seconds (ref 24–37)

## 2015-08-05 LAB — MAGNESIUM: MAGNESIUM: 2.1 mg/dL (ref 1.7–2.4)

## 2015-08-05 MED ORDER — DIVALPROEX SODIUM ER 500 MG PO TB24
1500.0000 mg | ORAL_TABLET | Freq: Every day | ORAL | Status: DC
  Administered 2015-08-05 (×2): 1500 mg via ORAL
  Filled 2015-08-05 (×3): qty 3

## 2015-08-05 MED ORDER — ONDANSETRON HCL 4 MG/2ML IJ SOLN: 4.0000 mg | Freq: Four times a day (QID) | INTRAMUSCULAR | Status: DC | PRN

## 2015-08-05 MED ORDER — IOPAMIDOL (ISOVUE-370) INJECTION 76%
INTRAVENOUS | Status: AC
  Administered 2015-08-05: 100 mL
  Filled 2015-08-05: qty 100

## 2015-08-05 MED ORDER — SIMVASTATIN 20 MG PO TABS
20.0000 mg | ORAL_TABLET | Freq: Every day | ORAL | Status: DC
  Administered 2015-08-05 (×2): 20 mg via ORAL
  Filled 2015-08-05 (×2): qty 1

## 2015-08-05 MED ORDER — ALBUTEROL SULFATE (2.5 MG/3ML) 0.083% IN NEBU: 2.5000 mg | INHALATION_SOLUTION | Freq: Four times a day (QID) | RESPIRATORY_TRACT | Status: DC | PRN

## 2015-08-05 MED ORDER — FUROSEMIDE 40 MG PO TABS
40.0000 mg | ORAL_TABLET | Freq: Every day | ORAL | Status: DC
  Administered 2015-08-05 – 2015-08-06 (×2): 40 mg via ORAL
  Filled 2015-08-05 (×2): qty 1

## 2015-08-05 MED ORDER — RIVAROXABAN 20 MG PO TABS
20.0000 mg | ORAL_TABLET | Freq: Every day | ORAL | Status: DC
  Administered 2015-08-05 – 2015-08-06 (×3): 20 mg via ORAL
  Filled 2015-08-05 (×3): qty 1

## 2015-08-05 MED ORDER — ALPRAZOLAM 0.5 MG PO TABS: 0.5000 mg | ORAL_TABLET | Freq: Two times a day (BID) | ORAL | Status: DC | PRN

## 2015-08-05 MED ORDER — TECHNETIUM TC 99M TETROFOSMIN IV KIT
30.0000 | PACK | Freq: Once | INTRAVENOUS | Status: AC | PRN
  Administered 2015-08-05: 30 via INTRAVENOUS

## 2015-08-05 MED ORDER — METOPROLOL SUCCINATE ER 50 MG PO TB24
50.0000 mg | ORAL_TABLET | Freq: Every day | ORAL | Status: DC
  Administered 2015-08-06: 50 mg via ORAL
  Filled 2015-08-05: qty 1

## 2015-08-05 MED ORDER — SODIUM CHLORIDE 0.9 % IV SOLN: 250.0000 mL | INTRAVENOUS | Status: DC | PRN

## 2015-08-05 MED ORDER — REGADENOSON 0.4 MG/5ML IV SOLN
0.4000 mg | Freq: Once | INTRAVENOUS | Status: AC
  Administered 2015-08-05: 0.4 mg via INTRAVENOUS
  Filled 2015-08-05: qty 5

## 2015-08-05 MED ORDER — NITROGLYCERIN 0.4 MG SL SUBL: 0.4000 mg | SUBLINGUAL_TABLET | SUBLINGUAL | Status: DC | PRN

## 2015-08-05 MED ORDER — ACETAMINOPHEN 325 MG PO TABS
650.0000 mg | ORAL_TABLET | ORAL | Status: DC | PRN
  Filled 2015-08-05: qty 2

## 2015-08-05 MED ORDER — LISINOPRIL 10 MG PO TABS
10.0000 mg | ORAL_TABLET | Freq: Every day | ORAL | Status: DC
  Administered 2015-08-05 – 2015-08-06 (×2): 10 mg via ORAL
  Filled 2015-08-05 (×2): qty 1

## 2015-08-05 MED ORDER — SODIUM CHLORIDE 0.9% FLUSH: 3.0000 mL | INTRAVENOUS | Status: DC | PRN

## 2015-08-05 MED ORDER — ASPIRIN EC 81 MG PO TBEC
81.0000 mg | DELAYED_RELEASE_TABLET | Freq: Every day | ORAL | Status: DC
  Administered 2015-08-05 – 2015-08-06 (×2): 81 mg via ORAL
  Filled 2015-08-05 (×2): qty 1

## 2015-08-05 MED ORDER — REGADENOSON 0.4 MG/5ML IV SOLN
INTRAVENOUS | Status: AC
Start: 2015-08-05 — End: 2015-08-05
  Administered 2015-08-05: 0.4 mg via INTRAVENOUS
  Filled 2015-08-05: qty 5

## 2015-08-05 MED ORDER — OLANZAPINE 5 MG PO TABS
5.0000 mg | ORAL_TABLET | Freq: Every day | ORAL | Status: DC
  Administered 2015-08-05 (×2): 5 mg via ORAL
  Filled 2015-08-05 (×3): qty 1

## 2015-08-05 MED ORDER — ASPIRIN 81 MG PO CHEW: 324.0000 mg | CHEWABLE_TABLET | ORAL | Status: AC

## 2015-08-05 MED ORDER — SODIUM CHLORIDE 0.9% FLUSH
3.0000 mL | Freq: Two times a day (BID) | INTRAVENOUS | Status: DC
  Administered 2015-08-05: 3 mL via INTRAVENOUS

## 2015-08-05 MED ORDER — LURASIDONE HCL 40 MG PO TABS
40.0000 mg | ORAL_TABLET | Freq: Every day | ORAL | Status: DC
  Administered 2015-08-05 (×2): 40 mg via ORAL
  Filled 2015-08-05 (×3): qty 1

## 2015-08-05 MED ORDER — ASPIRIN 300 MG RE SUPP: 300.0000 mg | RECTAL | Status: AC

## 2015-08-05 NOTE — Progress Notes (Signed)
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 She has a history of PAF and has been in NSR on Xarelto for a CHADS2VASC score of 3. She is followed by Dr. [name] for chronic diastolic CHF.  Chest pain began yesterday.   She says that it is similar to her prior MI. Initial trop was normal and BNP was normal. She denies any recent long travel by car or plane but her DDimer was mildly elevated at 0.52. Chest xray showed no active disease. EKG showed NSR with no ST changes. She was given SL NTG with resolution of her CP.  Subjective: No complaints this AM.  Objective: Vital signs in last 24 hours: Temp:  [97.8 F (36.6 C)-98.4 F (36.9 C)] 97.8 F (36.6 C) (07/13 0453) Pulse Rate:  [72-90] 72 (07/13 0453) Resp:  [14-22] 20 (07/13 0453) BP: (116-154)/(51-89) 120/62 mmHg (07/13 0453) SpO2:  [91 %-100 %] 95 % (07/13 0453) Weight:  [323 lb 12.8 oz (146.875 kg)-326 lb 3.2 oz (147.963 kg)] 326 lb 3.2 oz (147.963 kg) (07/13 0316) Weight change:  Last BM Date: 08/04/15 Intake/Output from previous day: -460 07/12 0701 - 07/13 0700 In: 240 [P.O.:240] Out: 700 [Urine:700] Intake/Output this shift:    PE: General:Pleasant affect, NAD Skin:Warm and dry, brisk capillary refill HEENT:normocephalic, sclera clear, mucus membranes moist Neck:supple, no JVD  Heart:S1S2 RRR without murmur, gallup, rub or click Lungs:clear without rales, rhonchi, or wheezes [id number], non tender, + BS, do not palpate liver spleen or masses Ext:no lower ext edema, 2+ pedal pulses, 2+ radial pulses Neuro:alert and oriented X 3, MAE, follows commands, + facial symmetry  Tele: SR to sl STach  Lab Results:  Recent Labs  08/04/15 1534 08/05/15 0729  WBC 6.7 4.6  HGB 15.3* 14.0  HCT 44.8 41.8  PLT 144* 124*   BMET  Recent Labs  08/04/15 1534  08/05/15 0336  NA 138 137  K 3.9 3.5  CL 101 101  CO2 27 28  GLUCOSE 124* 146*  BUN 21* 16  CREATININE 1.02* 0.91  CALCIUM 9.0 9.0    Recent Labs  08/04/15 1534 08/05/15 0336  TROPONINI <0.03 <0.03    Lab Results  Component Value Date   CHOL 158 08/05/2015   HDL 44 08/05/2015   LDLCALC 81 08/05/2015   TRIG 164* 08/05/2015   CHOLHDL 3.6 08/05/2015   Lab Results  Component Value Date   HGBA1C 6.0* 09/12/2014     Lab Results  Component Value Date   TSH 5.897* 08/05/2015    Hepatic Function Panel  Recent Labs  08/05/15 0336  PROT 6.6  ALBUMIN 3.7  AST 18  ALT 19  ALKPHOS 92  BILITOT 0.8    Recent Labs  08/05/15 0336  CHOL 158   No results for input(s): PROTIME in the last 72 hours.     Studies/Results: Dg Chest 2 View  08/04/2015  CLINICAL DATA:  60 year old female with chest pain today. EXAM: CHEST  2 VIEW COMPARISON:  09/11/2014 and prior chest radiographs FINDINGS: The cardiomediastinal silhouette is unremarkable. Mild peribronchial thickening again noted. There is no evidence of focal airspace disease, pulmonary edema, suspicious pulmonary nodule/mass, pleural effusion, or pneumothorax. No acute bony abnormalities are identified. IMPRESSION: No active cardiopulmonary disease. Electronically Signed   By: Margarette Canada M.D.   On: 08/04/2015 15:58   Ct Angio Chest Pe  W Or Wo Contrast  08/05/2015  CLINICAL DATA:  60 year old female with chest pain EXAM: CT ANGIOGRAPHY CHEST WITH CONTRAST TECHNIQUE: Multidetector CT imaging of the chest was performed using the standard protocol during bolus administration of intravenous contrast. Multiplanar CT image reconstructions and MIPs were obtained to evaluate the vascular anatomy. CONTRAST:  100 cc Isovue 370 COMPARISON:  Chest radiograph dated 08/04/2015 FINDINGS: There is mild diffuse hazy airspace opacity most compatible with subsegmental atelectatic changes. There is no focal consolidation, pleural effusion, or  pneumothorax. Small focal area of atelectatic changes noted in the lingula. The central airways are patent. The thoracic aorta appears unremarkable. The origins of the great vessels of the aortic arch appear patent. There is dilatation of the main pulmonary trunk suggestive of a degree of pulmonary hypertension. There is no CT evidence of pulmonary embolism. No cardiomegaly or pericardial effusion. Right hilar calcified granuloma. There is no adenopathy. The esophagus is grossly unremarkable. Small calcified left thyroid nodule. There is no axillary adenopathy. The chest wall soft tissues appear unremarkable. There is degenerative changes of the spine. No acute fracture. There is a 1.3 cm exophytic hypodense lesion arising from the posterior cortex of the left kidney . A subcentimeter exophytic hypodense lesion also noted from the superior pole of the left kidney. These lesions are incompletely characterized but may represent cysts. Ultrasound is recommended for further evaluation. The visualized upper abdomen is otherwise unremarkable. Review of the MIP images confirms the above findings. IMPRESSION: No CT evidence of pulmonary embolism. Small left renal hypodense lesions, incompletely characterized, possibly cysts. Ultrasound is recommended for further evaluation. Electronically Signed   By: Anner Crete M.D.   On: 08/05/2015 02:47    Medications: I have reviewed the patient's current medications. Scheduled Meds: . [START ON 08/06/2015] aspirin EC  81 mg Oral Daily  . divalproex  1,500 mg Oral QHS  . furosemide  40 mg Oral Daily  . lisinopril  10 mg Oral Daily  . lurasidone  40 mg Oral QHS  . metoprolol succinate  50 mg Oral Daily  . OLANZapine  5 mg Oral QHS  . rivaroxaban  20 mg Oral QAC supper  . simvastatin  20 mg Oral QHS  . sodium chloride flush  3 mL Intravenous Q12H   Continuous Infusions:  PRN Meds:.sodium chloride, acetaminophen, albuterol, ALPRAZolam, nitroGLYCERIN, ondansetron  (ZOFRAN) IV, sodium chloride flush  Assessment/Plan: Principal Problem:   Chest pain- troponins neg X 2 nuc study is ordered for today.  2 Day study.  Active Problems:   PAF on Xarelto -- CHADSVASC = 3.    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     Pain in the chest    Exertional shortness of breath neg troponin Echo has been ordered.    Elevated TSH at 5.897 - on no meds     Elevated D-Dimer but neg PE on CTA of the chest.      Poss. Renal cysts 1.3 cm exophytic hypodense lesion arising from the posterior cortex of the left kidney . A subcentimeter exophytic hypodense lesion also noted from the superior pole of the left kidney. These lesions are incompletely characterized but may represent cysts. Ultrasound is recommended for further evaluation. On CT    LOS: 1 day   Time spent with pt. :  15 minutes. Cecilie Kicks  Nurse Practitioner Certified Pager [phone] or after 5pm and on weekends call [phone] 08/05/2015, 8:01 AM   The patient has been seen in conjunction with Cecilie Kicks, NP. All aspects of care have been considered and discussed. The patient has been personally interviewed, examined, and all clinical data has been reviewed.   As per noted above and my previously placed note.

## 2015-08-05 NOTE — Progress Notes (Signed)
       Patient Name: Kathleen [name] Date of Encounter: 08/05/2015    SUBJECTIVE: Currently pain-free. Myocardial infarction is ruled out by enzymes.  TELEMETRY:  Normal sinus rhythm Filed Vitals:   08/04/15 2000 08/04/15 2101 08/05/15 0316 08/05/15 0453  BP: 139/75 146/82  120/62  Pulse: 84 76  72  Temp:  98.4 F (36.9 C)  97.8 F (36.6 C)  TempSrc:  Oral  Oral  Resp: 22 20  20   Height:  6' (1.829 m)    Weight:  323 lb 12.8 oz (146.875 kg) 326 lb 3.2 oz (147.963 kg)   SpO2: 98% 97%  95%    Intake/Output Summary (Last 24 hours) at 08/05/15 0804 Last data filed at 08/05/15 0400  Gross per 24 hour  Intake    240 ml  Output    700 ml  Net   -460 ml   LABS: Basic Metabolic Panel:  Recent Labs  08/04/15 1534 08/05/15 0336  NA 138 137  K 3.9 3.5  CL 101 101  CO2 27 28  GLUCOSE 124* 146*  BUN 21* 16  CREATININE 1.02* 0.91  CALCIUM 9.0 9.0  MG  --  2.1   CBC:  Recent Labs  08/04/15 1534 08/05/15 0729  WBC 6.7 4.6  NEUTROABS 4.5  --   HGB 15.3* 14.0  HCT 44.8 41.8  MCV 88.4 88.7  PLT 144* 124*   Cardiac Enzymes:  Recent Labs  08/04/15 1534 08/05/15 0336  TROPONINI <0.03 <0.03   BNP: Invalid input(s): POCBNP Hemoglobin A1C: No results for input(s): HGBA1C in the last 72 hours. Fasting Lipid Panel:  Recent Labs  08/05/15 0336  CHOL 158  HDL 44  LDLCALC 81  TRIG 164*  CHOLHDL 3.6    Radiology/Studies:  Chest CT on admission IMPRESSION: No CT evidence of pulmonary embolism.  Small left renal hypodense lesions, incompletely characterized, possibly cysts. Ultrasound is recommended for further evaluation.  Physical Exam: Blood pressure 120/62, pulse 72, temperature 97.8 F (36.6 C), temperature source Oral, resp. rate 20, height 6' (1.829 m), weight 326 lb 3.2 oz (147.963 kg), SpO2 95 %. Weight change:   Wt Readings from Last 3 Encounters:  08/05/15 326 lb 3.2 oz (147.963 kg)  06/22/15 321 lb 4 oz (145.718 kg)  05/17/15 319 lb 12.8 oz  (145.06 kg)   Obese. No distress. Clear lung fields. No pericardial rub.  ASSESSMENT:  1. Prolonged chest pain with ischemic quality but negative cardiac markers and nonischemic EKG. 2. 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 3. Chronic diastolic heart failure, without evidence of volume overload 4. History of PAF  Plan:  Plan 2 day myocardial perfusion imaging to rule out high risk subset  Signed, Belva Crome III 08/05/2015, 8:04 AM

## 2015-08-05 NOTE — Discharge Instructions (Addendum)
Information on my medicine - XARELTO (Rivaroxaban)  This medication education was reviewed with me or my healthcare representative as part of my discharge preparation.  The pharmacist that spoke with me during my hospital stay was:  Arty Baumgartner, [facility]  Why was Xarelto prescribed for you? Xarelto was prescribed for you to reduce the risk of a blood clot forming that can cause a stroke if you have a medical condition called atrial fibrillation (a type of irregular heartbeat).  What do you need to know about xarelto ? Take your Xarelto ONCE DAILY at the same time every day with your evening meal. If you have difficulty swallowing the tablet whole, you may crush it and mix in applesauce just prior to taking your dose.  Take Xarelto exactly as prescribed by your doctor and DO NOT stop taking Xarelto without talking to the doctor who prescribed the medication.  Stopping without other stroke prevention medication to take the place of Xarelto may increase your risk of developing a clot that causes a stroke.  Refill your prescription before you run out.  After discharge, you should have regular check-up appointments with your healthcare provider that is prescribing your Xarelto.  In the future your dose may need to be changed if your kidney function or weight changes by a significant amount.  What do you do if you miss a dose? If you are taking Xarelto ONCE DAILY and you miss a dose, take it as soon as you remember on the same day then continue your regularly scheduled once daily regimen the next day. Do not take two doses of Xarelto at the same time or on the same day.   Important Safety Information A possible side effect of Xarelto is bleeding. You should call your healthcare provider right away if you experience any of the following: ? Bleeding from an injury or your nose that does not stop. ? Unusual colored urine (red or dark brown) or unusual colored stools (red or  black). ? Unusual bruising for unknown reasons. ? A serious fall or if you hit your head (even if there is no bleeding).  Some medicines may interact with Xarelto and might increase your risk of bleeding while on Xarelto. To help avoid this, consult your healthcare provider or pharmacist prior to using any new prescription or non-prescription medications, including herbals, vitamins, non-steroidal anti-inflammatory drugs (NSAIDs) and supplements.  This website has more information on Xarelto: [email or web address].[email or web address]  Weigh daily Call Dr. Clayborne Dana office if weight climbs more than 3 pounds in a day or 5 pounds in a week. No salt to very little salt in your diet.  No more than 2000 mg in a day. Call if increased shortness of breath or increased swelling. We decreased your lasix to once daily    Heart healthy low salt diet  Call the office if any questions or problems or recurrent pain.    No need for Asprin on Xarelto.

## 2015-08-05 NOTE — Progress Notes (Signed)
  Echocardiogram 2D Echocardiogram has been performed.  Donata Clay 08/05/2015, 10:53 AM

## 2015-08-05 NOTE — Progress Notes (Signed)
Day 1 of 2 nuc study completed. The images will be read by [facility] tomorrow.  Dayna Dunn PA-C

## 2015-08-06 ENCOUNTER — Encounter (HOSPITAL_COMMUNITY): Payer: BC Managed Care – PPO

## 2015-08-06 ENCOUNTER — Encounter (HOSPITAL_COMMUNITY): Payer: BC Managed Care – PPO | Attending: Interventional Cardiology

## 2015-08-06 DIAGNOSIS — F319 Bipolar disorder, unspecified: Secondary | ICD-10-CM | POA: Diagnosis not present

## 2015-08-06 DIAGNOSIS — I5032 Chronic diastolic (congestive) heart failure: Secondary | ICD-10-CM | POA: Diagnosis not present

## 2015-08-06 DIAGNOSIS — I2510 Atherosclerotic heart disease of native coronary artery without angina pectoris: Secondary | ICD-10-CM | POA: Diagnosis not present

## 2015-08-06 DIAGNOSIS — R0789 Other chest pain: Secondary | ICD-10-CM | POA: Diagnosis not present

## 2015-08-06 DIAGNOSIS — R079 Chest pain, unspecified: Secondary | ICD-10-CM | POA: Diagnosis not present

## 2015-08-06 DIAGNOSIS — R0602 Shortness of breath: Secondary | ICD-10-CM | POA: Diagnosis not present

## 2015-08-06 DIAGNOSIS — I110 Hypertensive heart disease with heart failure: Secondary | ICD-10-CM | POA: Diagnosis not present

## 2015-08-06 LAB — NM MYOCAR MULTI W/SPECT W/WALL MOTION / EF
CSEPPHR: 96 {beats}/min
Rest HR: 81 {beats}/min

## 2015-08-06 MED ORDER — TECHNETIUM TC 99M TETROFOSMIN IV KIT
30.0000 | PACK | Freq: Once | INTRAVENOUS | Status: AC | PRN
  Administered 2015-08-06: 30 via INTRAVENOUS

## 2015-08-06 MED ORDER — ONDANSETRON HCL 4 MG/2ML IJ SOLN: 4.0000 mg | Freq: Four times a day (QID) | INTRAMUSCULAR | Status: DC | PRN

## 2015-08-06 MED ORDER — ASPIRIN 81 MG PO TBEC: 81.0000 mg | DELAYED_RELEASE_TABLET | Freq: Every day | ORAL | Status: DC

## 2015-08-06 MED ORDER — FUROSEMIDE 40 MG PO TABS: 40.0000 mg | ORAL_TABLET | Freq: Every day | ORAL | Status: DC

## 2015-08-06 NOTE — Progress Notes (Addendum)
   I reviewed the nuclear images and feel that they reveal breast attenuation. No ischemia or significant abnormality. No further evaluation is indicated.

## 2015-08-06 NOTE — Discharge Summary (Signed)
Physician Discharge Summary       Patient ID: Kathleen [name] MRN: [id number] DOB/AGE: 07/04/1955 60 y.o.  Admit date: 08/04/2015 Discharge date: 08/06/2015  Primary Cardiologist:Dr. Bensimhon   Discharge Diagnoses:  Principal Problem:   Chest pain, negative MI and negative Nuc stress test, maybe GI Active Problems:   Hyperlipidemia   BIPOLAR DISORDER UNSPECIFIED   HYPERTENSION, BENIGN ESSENTIAL   CAD (coronary artery disease)   Pain in the chest   Exertional shortness of breath   Discharged Condition: good  Procedures: none  LEXISCAN MYOVIEW- 2 day study IMPRESSION: 1. Equivocal subtle decreased activity in the anterior wall on stress images. Cannot exclude subtle ischemia.  2. Slight generalized hypokinesia.  3. Left ventricular ejection fraction 45%  4. Non invasive risk stratification*: Intermediate  Test reviewed by Dr. Tamala Julian and felt abnormality was breast attenuation.   ECHO: Study Conclusions  - Left ventricle: The cavity size was normal. Systolic function was  normal. The estimated ejection fraction was in the range of 55%  to 60%. Wall motion was normal; there were no regional wall  motion abnormalities. Doppler parameters are consistent with  abnormal left ventricular relaxation (grade 1 diastolic  dysfunction). - Aortic valve: Trileaflet; mildly thickened, mildly calcified  leaflets. - Left atrium: The atrium was mildly dilated. Anterior-posterior  dimension: 45 mm.   CT Angio of chest: FINDINGS: There is mild diffuse hazy airspace opacity most compatible with subsegmental atelectatic changes. There is no focal consolidation, pleural effusion, or pneumothorax. Small focal area of atelectatic changes noted in the lingula. The central airways are patent.  The thoracic aorta appears unremarkable. The origins of the great vessels of the aortic arch appear patent. There is dilatation of the main pulmonary trunk suggestive of a degree  of pulmonary hypertension. There is no CT evidence of pulmonary embolism. No cardiomegaly or pericardial effusion. Right hilar calcified granuloma. There is no adenopathy. The esophagus is grossly unremarkable. Small calcified left thyroid nodule.  There is no axillary adenopathy. The chest wall soft tissues appear unremarkable. There is degenerative changes of the spine. No acute fracture.  There is a 1.3 cm exophytic hypodense lesion arising from the posterior cortex of the left kidney . A subcentimeter exophytic hypodense lesion also noted from the superior pole of the left kidney. These lesions are incompletely characterized but may represent cysts. Ultrasound is recommended for further evaluation. The visualized upper abdomen is otherwise unremarkable.  Review of the MIP images confirms the above findings.  IMPRESSION: No CT evidence of pulmonary embolism.  Small left renal hypodense lesions, incompletely characterized, possibly cysts. Ultrasound is recommended for further evaluation.    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 She has a history of PAF and has been in NSR on Xarelto for a CHADS2VASC score of 3. She is followed by Dr. [name] for chronic diastolic CHF. Last echo 11/2011 showed normal LVF with EF 60-65% with no pulmonary HTN. EF 55-60% by echo 08/2013. She has noticed over the past 2 weeks increased DOE as well as orthopnea. He has not had much LE edema. She says that she lost 10lbs recently but then regained it. This am around 9am, while sitting watching TV, she developed chest pressure that was midsternal with no radiation but was associated with nausea, diaphoresis and SOB. It waxed and waned all day and around 3pm she decided  to go to [facility] for evaluation. She says that it is  similar to her prior MI. Initial trop was normal and BNP was normal. She denies any recent long travel by car or plane but her DDimer was mildly elevated at 0.52. Chest xray showed no active disease. EKG showed NSR with no ST changes. She was given SL NTG with resolution of her CP. She is now transferred to [facility] for further evaluation. She currently had no CP once here at [facility].  Her enzymes were negative and her stress test was without ischemia, Dr. Tamala Julian reviewed and felt breast attenuation was present.     Her Cr was up slightly and no edema on admit and her lasix was decreased to 40 mg once daily.  She will monitor wt and if it increases she will call the office.  Wt at discharge 327.    She ambulated in the hall without further discomfort, seen by Dr. Tamala Julian and was found ready for discharge.     Thought to renal ultrasound for cysts on follow up.    Consults: None  Significant Diagnostic Studies:  BMP Latest Ref Rng 08/05/2015 08/05/2015 08/04/2015  Glucose 65 - 99 mg/dL 134(H) 146(H) 124(H)  BUN 6 - 20 mg/dL 15 16 21(H)  Creatinine 0.44 - 1.00 mg/dL 0.96 0.91 1.02(H)  Sodium 135 - 145 mmol/L 140 137 138  Potassium 3.5 - 5.1 mmol/L 3.8 3.5 3.9  Chloride 101 - 111 mmol/L 102 101 101  CO2 22 - 32 mmol/L 27 28 27   Calcium 8.9 - 10.3 mg/dL 8.9 9.0 9.0   CBC Latest Ref Rng 08/05/2015 08/04/2015 10/02/2014  WBC 4.0 - 10.5 K/uL 4.6 6.7 7.1  Hemoglobin 12.0 - 15.0 g/dL 14.0 15.3(H) 14.3  Hematocrit 36.0 - 46.0 % 41.8 44.8 43.3  Platelets 150 - 400 K/uL 124(L) 144(L) 184    Troponin <0.03  BNP 26  Lipid Panel     Component Value Date/Time   CHOL 158 08/05/2015 0336   TRIG 164* 08/05/2015 0336   HDL 44 08/05/2015 0336   CHOLHDL 3.6 08/05/2015 0336   VLDL 33 08/05/2015 0336   LDLCALC 81 08/05/2015 0336   D-Dimer 0.52   UA Nitrite neg.  TSH  5.897   CHEST 2 VIEW  COMPARISON: 09/11/2014 and prior chest radiographs  FINDINGS: The cardiomediastinal silhouette is  unremarkable.  Mild peribronchial thickening again noted.  There is no evidence of focal airspace disease, pulmonary edema, suspicious pulmonary nodule/mass, pleural effusion, or pneumothorax. No acute bony abnormalities are identified.  IMPRESSION: No active cardiopulmonary disease.    Discharge Exam: Blood pressure 118/72, pulse 77, temperature 98 F (36.7 C), temperature source Oral, resp. rate 16, height 6' (1.829 m), weight 327 lb 6.4 oz (148.508 kg), SpO2 97 %. General:Pleasant affect, NAD Skin:Warm and dry, brisk capillary refill HEENT:normocephalic, sclera clear, mucus membranes moist Neck:supple, no JVD Heart:S1S2 RRR without murmur, gallup, rub or click Lungs:clear without rales, rhonchi, or wheezes [id number], non tender, + BS, do not palpate liver spleen or masses Ext:no lower ext edema, 2+ pedal pulses, 2+ radial pulses Neuro:alert and oriented, MAE, follows commands, + facial symmetry  Disposition: 01-Home or Self Care     Medication List    STOP taking these medications        amoxicillin-clavulanate 875-125 MG tablet  Commonly known as:  AUGMENTIN     benzonatate 100 MG capsule  Commonly known as:  TESSALON      TAKE these medications  albuterol 108 (90 Base) MCG/ACT inhaler  Commonly known as:  PROVENTIL HFA;VENTOLIN HFA  Inhale 2 puffs into the lungs every 6 (six) hours as needed for wheezing.     ALPRAZolam 0.5 MG tablet  Commonly known as:  XANAX  Take 0.5 mg by mouth 2 (two) times daily as needed for anxiety.     divalproex 500 MG 24 hr tablet  Commonly known as:  DEPAKOTE ER  Take 1,500 mg by mouth at bedtime.     furosemide 40 MG tablet  Commonly known as:  LASIX  Take 1 tablet (40 mg total) by mouth daily.     lisinopril 10 MG tablet  Commonly known as:  ZESTRIL  Take 1 tablet (10 mg total) by mouth daily.     lurasidone 40 MG Tabs tablet  Commonly known as:  LATUDA  Take 40 mg by mouth at bedtime.     metoprolol  succinate 50 MG 24 hr tablet  Commonly known as:  TOPROL-XL  Take 1 tablet (50 mg total) by mouth daily. Take with or immediately following a meal.     nitroGLYCERIN 0.4 MG SL tablet  Commonly known as:  NITROSTAT  Place 1 tablet (0.4 mg total) under the tongue every 5 (five) minutes as needed for chest pain. For chest pain     OLANZapine 5 MG tablet  Commonly known as:  ZYPREXA  Take 5 mg by mouth at bedtime.     rivaroxaban 20 MG Tabs tablet  Commonly known as:  XARELTO  TAKE 1 TABLET BY MOUTH EVERY DAY WITH SUPPER     simvastatin 20 MG tablet  Commonly known as:  ZOCOR  TAKE 1 TABLET BY MOUTH EVERY DAY AT BEDTIME     SUPER B COMPLEX/C PO  Take 1 capsule by mouth daily.       Follow-up Information    Follow up with Glori Bickers, MD.   Specialty:  Cardiology   Why:  the office will call with date and time   Contact information:   [place] [address] [phone]        Discharge Instructions:  Weigh daily Call Dr. Clayborne Dana office if weight climbs more than 3 pounds in a day or 5 pounds in a week. No salt to very little salt in your diet.  No more than 2000 mg in a day. Call if increased shortness of breath or increased swelling. We decreased your lasix to once daily    Heart healthy low salt diet  Call the office if any questions or problems or recurrent pain.    No need for Asprin on Xarelto.   Signed: Cecilie Kicks Nurse Practitioner-Certified  Medical Group: [facility] 08/06/2015, 4:46 PM  Time spent on discharge :> 30 minutes.   The patient has been seen in conjunction with Cecilie Kicks, NP. All aspects of care have been considered and discussed. The patient has been personally interviewed, examined, and all clinical data has been reviewed.   The patient's history is atypical for acute ischemia. Multiple sets of cardiac markers are negative for evidence of myocardial injury/infarction.  Electrocardiograms a nonischemic.  I personally reviewed the myocardial perfusion images obtained over 2 days. She has a relatively fixed anterior wall defect in the distribution of the known prior ischemic event due to spontaneous dissection of septal perforator. This study is not high risk and I have recommended continued conservative management.  She was able to ambulate without difficulty or recurrent  chest discomfort and was discharged.

## 2015-08-12 ENCOUNTER — Encounter: Payer: Self-pay | Admitting: Family Medicine

## 2015-08-12 ENCOUNTER — Telehealth (HOSPITAL_COMMUNITY): Payer: Self-pay | Admitting: Vascular Surgery

## 2015-08-12 NOTE — Telephone Encounter (Signed)
Left vpt message tp make f/u appt w/ PA/NP

## 2015-08-13 ENCOUNTER — Other Ambulatory Visit: Payer: Self-pay | Admitting: Family Medicine

## 2015-08-13 DIAGNOSIS — E038 Other specified hypothyroidism: Secondary | ICD-10-CM

## 2015-08-13 MED ORDER — LEVOTHYROXINE SODIUM 50 MCG PO TABS: 50.0000 ug | ORAL_TABLET | Freq: Every day | ORAL | Status: DC

## 2015-08-18 LAB — HM DIABETES EYE EXAM

## 2015-08-25 ENCOUNTER — Ambulatory Visit (HOSPITAL_COMMUNITY)
Admission: RE | Admit: 2015-08-25 | Discharge: 2015-08-25 | Disposition: A | Discharge status: Home / Self Care | Payer: BC Managed Care – PPO | Source: Ambulatory Visit | Attending: Cardiology | Admitting: Cardiology

## 2015-08-25 VITALS — BP 132/80 | HR 72 | Wt 326.4 lb

## 2015-08-25 DIAGNOSIS — Z86718 Personal history of other venous thrombosis and embolism: Secondary | ICD-10-CM | POA: Diagnosis not present

## 2015-08-25 DIAGNOSIS — I5032 Chronic diastolic (congestive) heart failure: Secondary | ICD-10-CM | POA: Insufficient documentation

## 2015-08-25 DIAGNOSIS — Z79899 Other long term (current) drug therapy: Secondary | ICD-10-CM | POA: Diagnosis not present

## 2015-08-25 DIAGNOSIS — I480 Paroxysmal atrial fibrillation: Secondary | ICD-10-CM | POA: Insufficient documentation

## 2015-08-25 DIAGNOSIS — I252 Old myocardial infarction: Secondary | ICD-10-CM | POA: Diagnosis not present

## 2015-08-25 DIAGNOSIS — F419 Anxiety disorder, unspecified: Secondary | ICD-10-CM | POA: Insufficient documentation

## 2015-08-25 DIAGNOSIS — Z6841 Body Mass Index (BMI) 40.0 and over, adult: Secondary | ICD-10-CM | POA: Diagnosis not present

## 2015-08-25 DIAGNOSIS — I110 Hypertensive heart disease with heart failure: Secondary | ICD-10-CM | POA: Insufficient documentation

## 2015-08-25 DIAGNOSIS — E892 Postprocedural hypoparathyroidism: Secondary | ICD-10-CM | POA: Diagnosis not present

## 2015-08-25 DIAGNOSIS — Z85828 Personal history of other malignant neoplasm of skin: Secondary | ICD-10-CM | POA: Insufficient documentation

## 2015-08-25 DIAGNOSIS — I10 Essential (primary) hypertension: Secondary | ICD-10-CM

## 2015-08-25 DIAGNOSIS — I2510 Atherosclerotic heart disease of native coronary artery without angina pectoris: Secondary | ICD-10-CM

## 2015-08-25 DIAGNOSIS — R0683 Snoring: Secondary | ICD-10-CM | POA: Insufficient documentation

## 2015-08-25 DIAGNOSIS — F329 Major depressive disorder, single episode, unspecified: Secondary | ICD-10-CM | POA: Diagnosis not present

## 2015-08-25 DIAGNOSIS — J45909 Unspecified asthma, uncomplicated: Secondary | ICD-10-CM | POA: Diagnosis not present

## 2015-08-25 DIAGNOSIS — Z888 Allergy status to other drugs, medicaments and biological substances status: Secondary | ICD-10-CM | POA: Insufficient documentation

## 2015-08-25 DIAGNOSIS — E785 Hyperlipidemia, unspecified: Secondary | ICD-10-CM | POA: Insufficient documentation

## 2015-08-25 DIAGNOSIS — Z7901 Long term (current) use of anticoagulants: Secondary | ICD-10-CM | POA: Diagnosis not present

## 2015-08-25 LAB — BASIC METABOLIC PANEL
Anion gap: 12 (ref 5–15)
BUN: 21 mg/dL — AB (ref 6–20)
CO2: 26 mmol/L (ref 22–32)
Calcium: 9.3 mg/dL (ref 8.9–10.3)
Chloride: 104 mmol/L (ref 101–111)
Creatinine, Ser: 0.88 mg/dL (ref 0.44–1.00)
GFR calc Af Amer: >60 mL/min (ref >60)
GLUCOSE: 144 mg/dL — AB (ref 65–99)
POTASSIUM: 4 mmol/L (ref 3.5–5.1)
Sodium: 142 mmol/L (ref 135–145)

## 2015-08-25 LAB — CBC
HEMATOCRIT: 47 % — AB (ref 36.0–46.0)
Hemoglobin: 15.4 g/dL — ABNORMAL HIGH (ref 12.0–15.0)
MCH: 29.3 pg (ref 26.0–34.0)
MCHC: 32.8 g/dL (ref 30.0–36.0)
MCV: 89.5 fL (ref 78.0–100.0)
Platelets: 136 10*3/uL — ABNORMAL LOW (ref 150–400)
RBC: 5.25 MIL/uL — ABNORMAL HIGH (ref 3.87–5.11)
RDW: 13.3 % (ref 11.5–15.5)
WBC: 4.6 10*3/uL (ref 4.0–10.5)

## 2015-08-25 LAB — BRAIN NATRIURETIC PEPTIDE: B Natriuretic Peptide: 19.4 pg/mL (ref 0.0–100.0)

## 2015-08-25 MED ORDER — RIVAROXABAN 20 MG PO TABS: ORAL_TABLET | ORAL | 3 refills | Status: DC

## 2015-08-25 NOTE — Progress Notes (Addendum)
Advanced Heart Failure Medication Review by a Pharmacist  Does the patient  feel that his/her medications are working for him/her?  yes  Has the patient been experiencing any side effects to the medications prescribed?  no  Does the patient measure his/her own blood pressure or blood glucose at home?  yes   Does the patient have any problems obtaining medications due to transportation or finances?   no  Understanding of regimen: good Understanding of indications: good Potential of compliance: good Patient understands to avoid NSAIDs. Patient understands to avoid decongestants.  Issues to address at subsequent visits: None   Pharmacist comments:  Kathleen [name] is a pleasant 60 yo F presenting with her medication bottles. Patient was recently discharged from hospital and all medications have been reviewed. She reports good compliance with her regimen but stated that at last hospital discharge, one of the MD's told her that they were going to decrease her furosemide dose to 20 mg daily but it was unchanged when she picked up the Rx and has continued to take 40 mg daily. No other medication-related questions or concerns for me at this time.   [name]. Velva Harman, PharmD, BCPS, CPP Clinical Pharmacist Pager: [phone] Phone: [phone] 08/25/2015 11:30 AM      Time with patient: 10 minutes Preparation and documentation time: 2 minutes Total time: 12 minutes

## 2015-08-25 NOTE — Patient Instructions (Signed)
Labs today  Your physician recommends that you schedule a follow-up appointment in: 3 months with Dr [name]  Do the following things EVERYDAY: 1) Weigh yourself in the morning before breakfast. Write it down and keep it in a log. 2) Take your medicines as prescribed 3) Eat low salt foods-Limit salt (sodium) to 2000 mg per day.  4) Stay as active as you can everyday 5) Limit all fluids for the day to less than 2 liters

## 2015-08-25 NOTE — Progress Notes (Signed)
Patient ID: Kathleen [name], female   DOB: [birth year], 60 y.o.   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 Sleep study 11/2013 was negative. Also has history of A fib.    Had NSTEMI in 2/09 had cath at that time with occlusion of septal perforator. Otherwise normal arteries. Was admitted for CP in 11/13. CE normal. Underwent dobutamine stress echo which was normal.   Admitted 09/11/14 for Afib RVR. She converted to sinus tach with dilt drip and was transitioned to Toprol-XL 25 mg daily. She was in NSR at the time of discharge. Discharge weight was 325 lb.  Admitted 7/12-7/14/17 with chest pain. CTA negative for PE. Troponins negative.  Had Myoview 08/05/15 with "subtle" anterior wall defect. Reviewed by Dr. [name] at visit 08/25/15, thought to possibly be breast attenuation. Echo stable as below.   She returns for follow up. Has not had any more chest pain since leaving hospital. Overall has been feeling good. Weight at home 325 this am. Can't walk very far without SOB. Gets SOB showering and changing clothes. Takes meds at bed time (apart from synthroid).  Not on any particular diet, wants to start back on low carb diet. Thinks weight gain is adipose. Doesn't feel like fluid is up. 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    Echo 11/13 EF 60-65% Mild LAE. RV normal. No PH. No comment on diastolic function.  Echo 09/04/13 EF 55-60%  Grade I DD Echo 08/05/15 EF 55-60%  Grade I DD  Labs:  09/25/13 K 4.1 Creatinine 1.0 , LDL 79 10/21/2014: K 5.4 Creatinine 0.95  02/22/2015: K 4.4 Creatinine 1.08   Allergies  Allergen Reactions  . Lamictal [Lamotrigine]     Rash   . Eggs Or Egg-Derived Products Hives    Can eat foods with cooked eggs; CANNOT handle when part of flu vaccine, etc.     Current  Outpatient Prescriptions  Medication Sig Dispense Refill  . albuterol (PROVENTIL HFA;VENTOLIN HFA) 108 (90 BASE) MCG/ACT inhaler Inhale 2 puffs into the lungs every 6 (six) hours as needed for wheezing. 1 Inhaler 1  . ALPRAZolam (XANAX) 0.5 MG tablet Take 0.5 mg by mouth 2 (two) times daily as needed for anxiety.    . divalproex (DEPAKOTE ER) 500 MG 24 hr tablet Take 1,500 mg by mouth at bedtime.     . furosemide (LASIX) 40 MG tablet Take 1 tablet (40 mg total) by mouth daily. 30 tablet 6  . levothyroxine (SYNTHROID, LEVOTHROID) 50 MCG tablet Take 1 tablet (50 mcg total) by mouth daily. 30 tablet 3  . lisinopril (ZESTRIL) 10 MG tablet Take 1 tablet (10 mg total) by mouth daily. 90 tablet 3  . lurasidone (LATUDA) 40 MG TABS tablet Take 40 mg by mouth at bedtime.     . metoprolol succinate (TOPROL-XL) 50 MG 24 hr tablet Take 1 tablet (50 mg total) by mouth daily. Take with or immediately following a meal. 30 tablet 6  . nitroGLYCERIN (NITROSTAT) 0.4 MG SL tablet Place 1 tablet (0.4 mg total) under the tongue every 5 (five) minutes as needed for chest pain. For chest pain 25 tablet 3  . OLANZapine (ZYPREXA) 5 MG tablet Take 5 mg by mouth at bedtime.    . rivaroxaban (  XARELTO) 20 MG TABS tablet TAKE 1 TABLET BY MOUTH EVERY DAY WITH SUPPER 30 tablet 3  . simvastatin (ZOCOR) 20 MG tablet TAKE 1 TABLET BY MOUTH EVERY DAY AT BEDTIME 90 tablet 0  . SUPER B COMPLEX/C PO Take 1 capsule by mouth daily.     No current facility-administered medications for this encounter.     Past Medical History:  Diagnosis Date  . Anemia   . Anxiety   . Asthma   . Atrial fibrillation ([place])   . Bronchitis    hx of   . Cancer ([place])    basal cell carcinoma on right hand   . CHF (congestive heart failure) ([place])    pt seen at heart/vascular spec clinic 08/14/2014   . Coronary artery disease   . Depression   . Dyslipidemia   . Fall   . Heart attack ([place]) 02/2007   non-q-wave with second septal perforator  .  Hyperlipidemia   . Hypertension   . Knee pain, left   . Lower extremity deep venous thrombosis (HCC)    20 years ago   . Measles    hx of in childhood   . Morbid obesity with BMI of 40.0-44.9, adult ([place])   . Pneumonia    hx of   . PONV (postoperative nausea and vomiting)   . Psychiatric hospitalization 05/2008  . Shingles    20 years ago   . Shortness of breath dyspnea    exercise   . Urinary incontinence   . Urinary tract bacterial infections     Past Surgical History:  Procedure Laterality Date  . BREAST MASS EXCISION     age 73, benign tumor  . CARDIAC CATHETERIZATION    . DILATION AND CURETTAGE OF UTERUS    . KNEE SURGERY     right, removed cartilage  . PARATHYROIDECTOMY N/A 08/25/2014   Procedure: PARATHYROIDECTOMY;  Surgeon: Armandina Gemma, MD;  Location: [facility];  Service: General;  Laterality: N/A;  . TONSILLECTOMY    . TUBAL LIGATION      ROS:  As stated in the HPI and negative for all other systems.  PHYSICAL EXAM BP 132/80 (BP Location: Left Arm, Patient Position: Sitting, Cuff Size: Large)   Pulse 72   Wt (!) 326 lb 6.4 oz (148.1 kg)   SpO2 96%   BMI 44.27 kg/m    Wt Readings from Last 3 Encounters:  08/25/15 (!) 326 lb 6.4 oz (148.1 kg)  08/06/15 (!) 327 lb 6.4 oz (148.5 kg)  06/22/15 (!) 321 lb 4 oz (145.7 kg)    General: Pleasant, NAD. No resp difficulty Psych: Normal affect. HEENT: Normal, without mass or lesion. Neck: Supple. Difficult to assess JVP due to pt size. Carotids 2+. No lymphadenopathy/thyromegaly appreciated. Heart: PMI nondisplaced. RRR no s3, s4, or murmurs appreciated Lungs: Resp regular and unlabored, CTA. Abdomen: Soft, non-tender, non-distended, No HSM, BS + x 4.  Extremities: No clubbing, cyanosis. Trace - 1+ ankle edema. Neuro: Alert and oriented X 3. Moves all extremities spontaneously.  ASSESSMENT AND PLAN 1. Chronic diastolic HF: Normal EF with grade I diastolic dysfunction on echo 09/13/14. NYHA II-III - Volume  status stable on exam.  - Continue lasix to 40 mg daily. Can take additional as needed for weight gain of 3 lbs overnight or 5 lbs within one week.  - Reinforced importance of fluid and salt restriction. Pt takes daily rates.  2. PAF. 1st noted during hospital admit 8/16. CHADSVASC = 3. - Regular rate.  Continue Toprol XL 50 daily -Continue Xarelto 20 mg dialy 3. Morbid obesity: - has been off diet but wants to start back on.  - Encouraged to do so, as well as cutting back portions and increasing activity as able.   4. CAD:  - Continue ASA 81, Plavix, statin.   - Admitted 7/17 with CP, Troponin negative. Myoview 08/05/15 with subtle anterior wall defect that may be breast attenuation. Low to intermediate risk. Discussed with Dr. [name]  - If she has recurrent CP symptoms, will recommend LHC (Last 02/2007) 5. HTN:   - Stable on current regimen.  She takes her antihypertensive meds at night.  6. Snoring:  - No OSA on sleep study.  7. Hyperparathyroidism-   - s/p partial parathyroidectomy 08/2014. Per PCP.   Follow up in 3 month with recent hospitalization. Pt knows to call office with any recurrent CP. We discussed in clinic today that if she has recurrence, next step will be LHC.   BMET, CBC, BNP today.   Shirley Friar PA-C  08/25/2015   Patient seen and examined with Oda Kilts, PA-C. We discussed all aspects of the encounter. I agree with the assessment and plan as stated above.   Volume status looks good. Nuclear images reviewed personally. Mild reversibe defect in anterior wall. Likely breast attenuation but cannot exclude ischemia. If symptoms progress would consider cath. Check labs today.   Bensimhon, Daniel,MD 2:08 PM

## 2015-09-13 ENCOUNTER — Other Ambulatory Visit: Payer: Self-pay

## 2015-09-14 ENCOUNTER — Other Ambulatory Visit: Payer: Self-pay

## 2015-09-15 ENCOUNTER — Other Ambulatory Visit (INDEPENDENT_AMBULATORY_CARE_PROVIDER_SITE_OTHER): Payer: BC Managed Care – PPO

## 2015-09-15 DIAGNOSIS — E038 Other specified hypothyroidism: Secondary | ICD-10-CM | POA: Diagnosis not present

## 2015-09-16 LAB — TSH: TSH: 1.42 u[IU]/mL (ref 0.35–4.50)

## 2015-10-05 ENCOUNTER — Other Ambulatory Visit: Payer: Self-pay | Admitting: Family Medicine

## 2015-10-05 ENCOUNTER — Other Ambulatory Visit (HOSPITAL_COMMUNITY): Payer: Self-pay | Admitting: Internal Medicine

## 2015-11-01 ENCOUNTER — Ambulatory Visit: Payer: Self-pay | Admitting: Internal Medicine

## 2015-11-02 ENCOUNTER — Ambulatory Visit (INDEPENDENT_AMBULATORY_CARE_PROVIDER_SITE_OTHER): Payer: BC Managed Care – PPO | Admitting: Internal Medicine

## 2015-11-02 VITALS — BP 130/84 | HR 81 | Ht 71.0 in | Wt 331.0 lb

## 2015-11-02 DIAGNOSIS — E210 Primary hyperparathyroidism: Secondary | ICD-10-CM | POA: Diagnosis not present

## 2015-11-02 DIAGNOSIS — E559 Vitamin D deficiency, unspecified: Secondary | ICD-10-CM | POA: Diagnosis not present

## 2015-11-02 LAB — VITAMIN D 25 HYDROXY (VIT D DEFICIENCY, FRACTURES): VITD: 27.41 ng/mL — ABNORMAL LOW (ref 30.00–100.00)

## 2015-11-02 NOTE — Patient Instructions (Addendum)
Please stop at the lab.  Continue vitamin D 5000 units daily.  Please continue to follow calcium yearly by your PCP.
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
## 2015-11-03 ENCOUNTER — Encounter: Payer: Self-pay | Admitting: Internal Medicine

## 2015-11-03 LAB — PTH, INTACT AND CALCIUM
Calcium: 8.9 mg/dL (ref 8.7–10.3)
PTH: 44 pg/mL (ref 15–65)

## 2015-12-02 ENCOUNTER — Other Ambulatory Visit: Payer: Self-pay | Admitting: Family Medicine

## 2015-12-27 ENCOUNTER — Other Ambulatory Visit: Payer: Self-pay | Admitting: Obstetrics and Gynecology

## 2015-12-27 DIAGNOSIS — Z1231 Encounter for screening mammogram for malignant neoplasm of breast: Secondary | ICD-10-CM

## 2015-12-29 ENCOUNTER — Telehealth (HOSPITAL_COMMUNITY): Payer: Self-pay

## 2015-12-29 ENCOUNTER — Observation Stay (HOSPITAL_COMMUNITY)
Admission: EM | Admit: 2015-12-29 | Discharge: 2015-12-31 | Disposition: A | Discharge status: Home / Self Care | Payer: BC Managed Care – PPO | Attending: Cardiovascular Disease | Admitting: Cardiovascular Disease

## 2015-12-29 ENCOUNTER — Encounter (HOSPITAL_COMMUNITY): Payer: Self-pay

## 2015-12-29 ENCOUNTER — Emergency Department (HOSPITAL_COMMUNITY): Payer: BC Managed Care – PPO

## 2015-12-29 DIAGNOSIS — I480 Paroxysmal atrial fibrillation: Secondary | ICD-10-CM

## 2015-12-29 DIAGNOSIS — I5032 Chronic diastolic (congestive) heart failure: Secondary | ICD-10-CM | POA: Diagnosis not present

## 2015-12-29 DIAGNOSIS — Z6841 Body Mass Index (BMI) 40.0 and over, adult: Secondary | ICD-10-CM | POA: Insufficient documentation

## 2015-12-29 DIAGNOSIS — I878 Other specified disorders of veins: Secondary | ICD-10-CM | POA: Insufficient documentation

## 2015-12-29 DIAGNOSIS — R600 Localized edema: Secondary | ICD-10-CM | POA: Diagnosis present

## 2015-12-29 DIAGNOSIS — F419 Anxiety disorder, unspecified: Secondary | ICD-10-CM | POA: Insufficient documentation

## 2015-12-29 DIAGNOSIS — R9439 Abnormal result of other cardiovascular function study: Secondary | ICD-10-CM | POA: Insufficient documentation

## 2015-12-29 DIAGNOSIS — Z85828 Personal history of other malignant neoplasm of skin: Secondary | ICD-10-CM | POA: Diagnosis not present

## 2015-12-29 DIAGNOSIS — I10 Essential (primary) hypertension: Secondary | ICD-10-CM

## 2015-12-29 DIAGNOSIS — Z7901 Long term (current) use of anticoagulants: Secondary | ICD-10-CM | POA: Insufficient documentation

## 2015-12-29 DIAGNOSIS — Z803 Family history of malignant neoplasm of breast: Secondary | ICD-10-CM | POA: Insufficient documentation

## 2015-12-29 DIAGNOSIS — R0609 Other forms of dyspnea: Secondary | ICD-10-CM | POA: Diagnosis not present

## 2015-12-29 DIAGNOSIS — F319 Bipolar disorder, unspecified: Secondary | ICD-10-CM | POA: Insufficient documentation

## 2015-12-29 DIAGNOSIS — Z86718 Personal history of other venous thrombosis and embolism: Secondary | ICD-10-CM | POA: Insufficient documentation

## 2015-12-29 DIAGNOSIS — E785 Hyperlipidemia, unspecified: Secondary | ICD-10-CM | POA: Diagnosis not present

## 2015-12-29 DIAGNOSIS — E119 Type 2 diabetes mellitus without complications: Secondary | ICD-10-CM | POA: Diagnosis not present

## 2015-12-29 DIAGNOSIS — N3941 Urge incontinence: Secondary | ICD-10-CM | POA: Diagnosis not present

## 2015-12-29 DIAGNOSIS — I252 Old myocardial infarction: Secondary | ICD-10-CM | POA: Insufficient documentation

## 2015-12-29 DIAGNOSIS — R079 Chest pain, unspecified: Principal | ICD-10-CM

## 2015-12-29 DIAGNOSIS — R609 Edema, unspecified: Secondary | ICD-10-CM | POA: Diagnosis present

## 2015-12-29 DIAGNOSIS — R0601 Orthopnea: Secondary | ICD-10-CM

## 2015-12-29 DIAGNOSIS — I2510 Atherosclerotic heart disease of native coronary artery without angina pectoris: Secondary | ICD-10-CM | POA: Insufficient documentation

## 2015-12-29 DIAGNOSIS — I872 Venous insufficiency (chronic) (peripheral): Secondary | ICD-10-CM | POA: Insufficient documentation

## 2015-12-29 DIAGNOSIS — I110 Hypertensive heart disease with heart failure: Secondary | ICD-10-CM | POA: Diagnosis not present

## 2015-12-29 DIAGNOSIS — I2542 Coronary artery dissection: Secondary | ICD-10-CM | POA: Diagnosis not present

## 2015-12-29 DIAGNOSIS — Z91012 Allergy to eggs: Secondary | ICD-10-CM | POA: Diagnosis not present

## 2015-12-29 DIAGNOSIS — E7800 Pure hypercholesterolemia, unspecified: Secondary | ICD-10-CM | POA: Diagnosis not present

## 2015-12-29 HISTORY — DX: Type 2 diabetes mellitus without complications: E11.9

## 2015-12-29 LAB — BASIC METABOLIC PANEL
Anion gap: 9 (ref 5–15)
BUN: 20 mg/dL (ref 6–20)
CALCIUM: 9.2 mg/dL (ref 8.9–10.3)
CO2: 31 mmol/L (ref 22–32)
CREATININE: 1.02 mg/dL — AB (ref 0.44–1.00)
Chloride: 100 mmol/L — ABNORMAL LOW (ref 101–111)
GFR calc Af Amer: >60 mL/min (ref >60)
GFR, EST NON AFRICAN AMERICAN: 59 mL/min — AB (ref >60)
Glucose, Bld: 101 mg/dL — ABNORMAL HIGH (ref 65–99)
POTASSIUM: 4.3 mmol/L (ref 3.5–5.1)
SODIUM: 140 mmol/L (ref 135–145)

## 2015-12-29 LAB — TROPONIN I

## 2015-12-29 LAB — HEPARIN LEVEL (UNFRACTIONATED): Heparin Unfractionated: 0.54 IU/mL (ref 0.30–0.70)

## 2015-12-29 LAB — CBC
HCT: 46.9 % — ABNORMAL HIGH (ref 36.0–46.0)
Hemoglobin: 15.9 g/dL — ABNORMAL HIGH (ref 12.0–15.0)
MCH: 30.1 pg (ref 26.0–34.0)
MCHC: 33.9 g/dL (ref 30.0–36.0)
MCV: 88.7 fL (ref 78.0–100.0)
PLATELETS: 155 10*3/uL (ref 150–400)
RBC: 5.29 MIL/uL — ABNORMAL HIGH (ref 3.87–5.11)
RDW: 13.8 % (ref 11.5–15.5)
WBC: 6.3 10*3/uL (ref 4.0–10.5)

## 2015-12-29 LAB — I-STAT TROPONIN, ED: TROPONIN I, POC: 0 ng/mL (ref 0.00–0.08)

## 2015-12-29 LAB — GLUCOSE, CAPILLARY: Glucose-Capillary: 183 mg/dL — ABNORMAL HIGH (ref 65–99)

## 2015-12-29 LAB — APTT: APTT: 26 s (ref 24–36)

## 2015-12-29 LAB — MRSA PCR SCREENING: MRSA BY PCR: NEGATIVE

## 2015-12-29 MED ORDER — SODIUM CHLORIDE 0.9% FLUSH: 3.0000 mL | INTRAVENOUS | Status: DC | PRN

## 2015-12-29 MED ORDER — ACETAMINOPHEN 325 MG PO TABS: 650.0000 mg | ORAL_TABLET | ORAL | Status: DC | PRN

## 2015-12-29 MED ORDER — ASPIRIN 81 MG PO CHEW: 324.0000 mg | CHEWABLE_TABLET | ORAL | Status: AC

## 2015-12-29 MED ORDER — SODIUM CHLORIDE 0.9 % IV SOLN: 250.0000 mL | INTRAVENOUS | Status: DC | PRN

## 2015-12-29 MED ORDER — ONDANSETRON HCL 4 MG/2ML IJ SOLN: 4.0000 mg | Freq: Four times a day (QID) | INTRAMUSCULAR | Status: DC | PRN

## 2015-12-29 MED ORDER — ALBUTEROL SULFATE (2.5 MG/3ML) 0.083% IN NEBU
2.5000 mg | INHALATION_SOLUTION | Freq: Four times a day (QID) | RESPIRATORY_TRACT | Status: DC | PRN
Start: 2015-12-29 — End: 2015-12-31

## 2015-12-29 MED ORDER — NITROGLYCERIN 0.4 MG SL SUBL: 0.4000 mg | SUBLINGUAL_TABLET | SUBLINGUAL | Status: DC | PRN

## 2015-12-29 MED ORDER — SODIUM CHLORIDE 0.9 % WEIGHT BASED INFUSION
3.0000 mL/kg/h | INTRAVENOUS | Status: DC
  Administered 2015-12-30: 3 mL/kg/h via INTRAVENOUS

## 2015-12-29 MED ORDER — ASPIRIN EC 81 MG PO TBEC
81.0000 mg | DELAYED_RELEASE_TABLET | Freq: Every day | ORAL | Status: DC
  Administered 2015-12-31: 81 mg via ORAL
  Filled 2015-12-29: qty 1

## 2015-12-29 MED ORDER — SIMVASTATIN 20 MG PO TABS
20.0000 mg | ORAL_TABLET | Freq: Every day | ORAL | Status: DC
  Administered 2015-12-29 – 2015-12-30 (×2): 20 mg via ORAL
  Filled 2015-12-29 (×2): qty 1

## 2015-12-29 MED ORDER — FUROSEMIDE 40 MG PO TABS
40.0000 mg | ORAL_TABLET | Freq: Every day | ORAL | Status: DC
  Filled 2015-12-29: qty 1

## 2015-12-29 MED ORDER — OLANZAPINE 5 MG PO TABS
5.0000 mg | ORAL_TABLET | Freq: Every day | ORAL | Status: DC
  Administered 2015-12-29 – 2015-12-30 (×2): 5 mg via ORAL
  Filled 2015-12-29 (×2): qty 1

## 2015-12-29 MED ORDER — LISINOPRIL 10 MG PO TABS
10.0000 mg | ORAL_TABLET | Freq: Every day | ORAL | Status: DC
  Administered 2015-12-31: 10 mg via ORAL
  Filled 2015-12-29: qty 1

## 2015-12-29 MED ORDER — ASPIRIN 81 MG PO CHEW
81.0000 mg | CHEWABLE_TABLET | ORAL | Status: AC
  Administered 2015-12-30: 81 mg via ORAL
  Filled 2015-12-29: qty 1

## 2015-12-29 MED ORDER — LEVOTHYROXINE SODIUM 50 MCG PO TABS
50.0000 ug | ORAL_TABLET | Freq: Every day | ORAL | Status: DC
  Administered 2015-12-31: 50 ug via ORAL
  Filled 2015-12-29: qty 1

## 2015-12-29 MED ORDER — ALBUTEROL SULFATE HFA 108 (90 BASE) MCG/ACT IN AERS: 2.0000 | INHALATION_SPRAY | Freq: Four times a day (QID) | RESPIRATORY_TRACT | Status: DC | PRN

## 2015-12-29 MED ORDER — ASPIRIN 300 MG RE SUPP: 300.0000 mg | RECTAL | Status: AC

## 2015-12-29 MED ORDER — HEPARIN (PORCINE) IN NACL 100-0.45 UNIT/ML-% IJ SOLN
14.0000 [IU]/kg/h | INTRAMUSCULAR | Status: DC
  Administered 2015-12-29: 14 [IU]/kg/h via INTRAVENOUS
  Filled 2015-12-29: qty 250

## 2015-12-29 MED ORDER — METOPROLOL SUCCINATE ER 50 MG PO TB24
50.0000 mg | ORAL_TABLET | Freq: Every day | ORAL | Status: DC
  Administered 2015-12-29 – 2015-12-30 (×2): 50 mg via ORAL
  Filled 2015-12-29 (×2): qty 1

## 2015-12-29 MED ORDER — DIVALPROEX SODIUM ER 500 MG PO TB24
1500.0000 mg | ORAL_TABLET | Freq: Every day | ORAL | Status: DC
  Administered 2015-12-29 – 2015-12-30 (×2): 1500 mg via ORAL
  Filled 2015-12-29 (×2): qty 3
  Filled 2015-12-29: qty 2

## 2015-12-29 MED ORDER — SODIUM CHLORIDE 0.9 % WEIGHT BASED INFUSION: 1.0000 mL/kg/h | INTRAVENOUS | Status: DC

## 2015-12-29 MED ORDER — SODIUM CHLORIDE 0.9% FLUSH
3.0000 mL | Freq: Two times a day (BID) | INTRAVENOUS | Status: DC
  Administered 2015-12-29: 10 mL via INTRAVENOUS

## 2015-12-29 MED ORDER — LURASIDONE HCL 40 MG PO TABS
40.0000 mg | ORAL_TABLET | Freq: Every day | ORAL | Status: DC
  Administered 2015-12-29 – 2015-12-30 (×2): 40 mg via ORAL
  Filled 2015-12-29 (×2): qty 1

## 2015-12-29 MED ORDER — ALPRAZOLAM 0.5 MG PO TABS: 0.5000 mg | ORAL_TABLET | Freq: Two times a day (BID) | ORAL | Status: DC | PRN

## 2015-12-29 MED ORDER — INSULIN ASPART 100 UNIT/ML ~~LOC~~ SOLN: 0.0000 [IU] | Freq: Three times a day (TID) | SUBCUTANEOUS | Status: DC

## 2015-12-29 NOTE — ED Triage Notes (Signed)
Patient transported via [facility]. They report the patient has been having left sided chest pain intermittently for the past 2-3 days.  Patient endorses diaphoreses, dyspnea, and nausea. Patients BP was difficult to obtain in route so no nitro was given.   Patient took 1 adult asprin prior to arrival. Last set of vitals from EMS was 130/palp, Hr 76, RR 18, 98%, Pain 5/10.

## 2015-12-29 NOTE — Progress Notes (Signed)
ANTICOAGULATION CONSULT NOTE - Initial Consult  Pharmacy Consult for Heparin  Indication:  history of Afib, holding Xarelto for heart cath.  Allergies  Allergen Reactions  . Lamictal [Lamotrigine]     Rash   . Eggs Or Egg-Derived Products Hives    Can eat foods with cooked eggs; CANNOT handle when part of flu vaccine, etc.     Patient Measurements: Height: 6' (182.9 cm) Weight: (!) 331 lb 1.6 oz (150.2 kg) IBW/kg (Calculated) : 73.1 Heparin Dosing Weight: 109 kg  Vital Signs: Temp: 98.3 F (36.8 C) (12/06 2107) Temp Source: Oral (12/06 2107) BP: 132/70 (12/06 2107) Pulse Rate: 80 (12/06 2107)  Labs:  Recent Labs  12/29/15 1328 12/29/15 1752  HGB 15.9*  --   HCT 46.9*  --   PLT 155  --   CREATININE 1.02*  --   TROPONINI  --  <0.03    Estimated Creatinine Clearance: 96.2 mL/min (by C-G formula based on SCr of 1.02 mg/dL (H)).   Medical History: Past Medical History:  Diagnosis Date  . Anemia   . Anxiety   . Asthma   . Atrial fibrillation ([place])   . Bronchitis    hx of   . Cancer ([place])    basal cell carcinoma on right hand   . CHF (congestive heart failure) ([place])    pt seen at heart/vascular spec clinic 08/14/2014   . Coronary artery disease   . Depression   . Dyslipidemia   . Fall   . Heart attack 02/2007   non-q-wave with second septal perforator  . Hyperlipidemia   . Hypertension   . Knee pain, left   . Lower extremity deep venous thrombosis (HCC)    20 years ago   . Measles    hx of in childhood   . Morbid obesity with BMI of 40.0-44.9, adult ([place])   . Pneumonia    hx of   . PONV (postoperative nausea and vomiting)   . Psychiatric hospitalization 05/2008  . Shingles    20 years ago   . Shortness of breath dyspnea    exercise   . Urinary incontinence   . Urinary tract bacterial infections     Medications:  Prescriptions Prior to Admission  Medication Sig Dispense Refill Last Dose  . albuterol (PROVENTIL HFA;VENTOLIN HFA) 108 (90 BASE)  MCG/ACT inhaler Inhale 2 puffs into the lungs every 6 (six) hours as needed for wheezing. 1 Inhaler 1 rescue at rescue  . ALPRAZolam (XANAX) 0.5 MG tablet Take 0.5 mg by mouth 2 (two) times daily as needed for anxiety.   12/28/2015 at Unknown time  . divalproex (DEPAKOTE ER) 500 MG 24 hr tablet Take 1,500 mg by mouth at bedtime.    12/28/2015 at Unknown time  . furosemide (LASIX) 40 MG tablet Take 1 tablet (40 mg total) by mouth daily. (Patient taking differently: Take 80 mg by mouth daily. ) 30 tablet 6 12/28/2015 at Unknown time  . levothyroxine (SYNTHROID, LEVOTHROID) 50 MCG tablet Take 1 tablet (50 mcg total) by mouth daily. 30 tablet 3 Past Week at Unknown time  . lisinopril (ZESTRIL) 10 MG tablet Take 1 tablet (10 mg total) by mouth daily. 90 tablet 3 12/28/2015 at Unknown time  . lurasidone (LATUDA) 40 MG TABS tablet Take 40 mg by mouth at bedtime.    12/28/2015 at Unknown time  . metoprolol succinate (TOPROL-XL) 50 MG 24 hr tablet TAKE 1 TABLET BY MOUTH DAILY WITH OR IMMEDIATELY FOLLOWING A MEAL 30 tablet  5 12/28/2015 at 2000  . nitroGLYCERIN (NITROSTAT) 0.4 MG SL tablet Place 1 tablet (0.4 mg total) under the tongue every 5 (five) minutes as needed for chest pain. For chest pain 25 tablet 3 unk at unk  . OLANZapine (ZYPREXA) 5 MG tablet Take 5 mg by mouth at bedtime.   12/28/2015 at Unknown time  . rivaroxaban (XARELTO) 20 MG TABS tablet TAKE 1 TABLET BY MOUTH EVERY DAY WITH SUPPER 30 tablet 3 12/28/2015 at 1800  . simvastatin (ZOCOR) 20 MG tablet TAKE 1 TABLET BY MOUTH AT BEDTIME 90 tablet 1 12/28/2015 at Unknown time    Assessment: 60 y.o female with chronic diastolic heart failure, hypertension, hyperlipidemia, CAD s/p SCAD, morbid obesity, here with chest pain.  On Xarelto 20mg  daily with supper PTA, last taken yesterday 12/28/15 at 18:00. Cardiologist is holding her Xarelto tonight and plan for cardiac cath tomorrow evening.   Goal of Therapy:  Heparin level 0.3-0.7 units/ml aPTT 66-102  seconds Monitor platelets by anticoagulation protocol: Yes   Plan:  STAT baseline aPTT and heparin level Start IV heparin drip 1550 units/hr  Monitor and adjust heparin rate using the aPTT until effect of Xarelto on heparin level diminished. Check 6 hour aPTT Daily aPTT / HL , CBC   Thank you for allowing pharmacy to be part of this patients care team. Nicole Cella, [place] Clinical Pharmacist Pager: [phone] 12/29/2015,9:16 PM

## 2015-12-29 NOTE — ED Provider Notes (Signed)
[facility] DEPT Provider Note   CSN: [id number] Arrival date & time: 12/29/15  1257   History   Chief Complaint Chief Complaint  Patient presents with  . Chest Pain    HPI Kathleen [name] is a 60 y.o. female with a PMH of CAD w/ NSTEMI with occlusion of septal perforator 2/2 spontaneous coronary dissection in 2009, HFpEF, pAF, HTN, HLD, morbid obesity, and anxiety presenting to the ED with chest pain for the last 3 days. She states that the chest pain worsened this morning around 9:30 AM. 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 This was very stressful for her and she states she has been very anxious all morning. She went to the school nurse, who called EMS. The chest pain is located on her left side. The chest pain is "sharp". The chest pain comes and goes. The chest pain is worse with walking and worse with anxiety. The pain is better with rest and got better after she took aspirin 325 mg at home. She has associated nausea and shortness of breath. She denies any diaphoresis. She states that this chest pain feels exactly same as her previous heart attack. The pain does not radiate.   She states that since Thanksgiving, she has gained about 12lbs. She has also noticed worsening lower extremity edema and new two pillow orthopnea. She called her cardiologist's office to let him know what has been going on. She then increased her Lasix dose from 40mg  daily to 80mg  daily, which has been helping the lower extremity edema.  She denies any other symptoms besides her regular occasional palpitations. She denies any recent travel or recent surgery.  She does have a significant cardiac history. She had an NSTEMI in 2009 with occlusion of the septal perforator. She was admitted for chest pain in 2013. Cardiac enzymes were normal at thime and she had a dobutamine stress echo, which was normal. She was admitted in 08/2014 for Afib with RVR and converted to sinus tach on a  dilt drip. She was admitted for CP from 7/12-7/14. CTA chest was negative for PE. She had a myoview 08/05/15 which showed subtle anterior wall defect. Dr. [name] thought this was possibly secondary to breast attenuation.   HPI  Past Medical History:  Diagnosis Date  . Anemia   . Anxiety   . Asthma   . Atrial fibrillation ([place])   . Bronchitis    hx of   . Cancer ([place])    basal cell carcinoma on right hand   . CHF (congestive heart failure) ([place])    pt seen at heart/vascular spec clinic 08/14/2014   . Coronary artery disease   . Depression   . Dyslipidemia   . Fall   . Heart attack 02/2007   non-q-wave with second septal perforator  . Hyperlipidemia   . Hypertension   . Knee pain, left   . Lower extremity deep venous thrombosis (HCC)    20 years ago   . Measles    hx of in childhood   . Morbid obesity with BMI of 40.0-44.9, adult ([place])   . Pneumonia    hx of   . PONV (postoperative nausea and vomiting)   . Psychiatric hospitalization 05/2008  . Shingles    20 years ago   . Shortness of breath dyspnea    exercise   . Urinary incontinence   . Urinary tract bacterial infections     Patient Active Problem List  Diagnosis Date Noted  . Pain in the chest   . Exertional shortness of breath   . Chest pain, negative MI and negative Nuc stress test, maybe GI 08/04/2015  . Injury of right hand 09/24/2014  . Lung nodule seen on imaging study 09/24/2014  . Atrial fibrillation ([place]) 09/11/2014  . Preoperative cardiovascular examination 08/22/2014  . Asthmatic bronchitis with exacerbation 05/25/2014  . Primary hyperparathyroidism ([place]) 04/15/2014  . Palpitations 03/20/2014  . Radicular low back pain 11/24/2013  . Chronic diastolic heart failure ([place]) 10/02/2013  . Shortness of breath 08/31/2013  . Venous stasis dermatitis of both lower extremities 08/19/2013  . Prediabetes 06/17/2013  . Peripheral edema 06/12/2013  . Other malaise and fatigue 06/12/2013  . Polyuria  06/12/2013  . Nocturia 06/12/2013  . Pyelonephritis 05/12/2013  . Cellulitis of leg, right 10/11/2012  . Morbid obesity ([place]) 08/09/2012  . Urinary incontinence, urge 07/11/2012  . Chest pain, mid sternal 12/07/2011  . RUQ abdominal pain 07/24/2011  . Fungal infection of nail 02/15/2011  . General medical examination 02/15/2011  . CAD (coronary artery disease) 10/31/2010  . [place] INJURY 03/29/2010  . NEOPLASM OF UNCERTAIN BEHAVIOR OF SKIN 02/11/2010  . Bilateral anterior knee pain 01/05/2010  . MUSCLE SPASM, TRAPEZIUS 01/05/2010  . BIPOLAR DISORDER UNSPECIFIED 09/14/2009  . Acute sinusitis 12/16/2008  . ACUT MYOCARD INFARCT UNS SITE SUBSQT EPIS CARE 07/02/2008  . EDEMA 06/23/2008  . Hyperlipidemia 05/23/2008  . UNSPECIFIED DISORDER OF THYROID 02/04/2008  . Obesity 02/04/2008  . HYPERTENSION, BENIGN ESSENTIAL 02/04/2008  . MYOCARDIAL INFARCTION, HX OF 03/05/2007    Past Surgical History:  Procedure Laterality Date  . BREAST MASS EXCISION     age 89, benign tumor  . CARDIAC CATHETERIZATION    . DILATION AND CURETTAGE OF UTERUS    . KNEE SURGERY     right, removed cartilage  . PARATHYROIDECTOMY N/A 08/25/2014   Procedure: PARATHYROIDECTOMY;  Surgeon: Armandina Gemma, MD;  Location: [facility] ORS;  Service: General;  Laterality: N/A;  . TONSILLECTOMY    . TUBAL LIGATION      OB History    No data available       Home Medications    Prior to Admission medications   Medication Sig Start Date End Date Taking? Authorizing Provider  albuterol (PROVENTIL HFA;VENTOLIN HFA) 108 (90 BASE) MCG/ACT inhaler Inhale 2 puffs into the lungs every 6 (six) hours as needed for wheezing. 11/12/12  Yes Yvonne R Lowne Chase, DO  ALPRAZolam Duanne Moron) 0.5 MG tablet Take 0.5 mg by mouth 2 (two) times daily as needed for anxiety.   Yes Historical Provider, MD  divalproex (DEPAKOTE ER) 500 MG 24 hr tablet Take 1,500 mg by mouth at bedtime.    Yes Historical Provider, MD  furosemide (LASIX) 40 MG tablet Take 1  tablet (40 mg total) by mouth daily. Patient taking differently: Take 80 mg by mouth daily.  08/06/15  Yes Isaiah Serge, NP  levothyroxine (SYNTHROID, LEVOTHROID) 50 MCG tablet Take 1 tablet (50 mcg total) by mouth daily. 08/13/15  Yes Midge Minium, MD  lisinopril (ZESTRIL) 10 MG tablet Take 1 tablet (10 mg total) by mouth daily. 02/11/15  Yes [name] D Clegg, NP  lurasidone (LATUDA) 40 MG TABS tablet Take 40 mg by mouth at bedtime.    Yes Historical Provider, MD  metoprolol succinate (TOPROL-XL) 50 MG 24 hr tablet TAKE 1 TABLET BY MOUTH DAILY WITH OR IMMEDIATELY FOLLOWING A MEAL 10/05/15  Yes Jolaine Artist, MD  nitroGLYCERIN (NITROSTAT)  0.4 MG SL tablet Place 1 tablet (0.4 mg total) under the tongue every 5 (five) minutes as needed for chest pain. For chest pain 12/07/11  Yes Rhonda G Barrett, PA-C  OLANZapine (ZYPREXA) 5 MG tablet Take 5 mg by mouth at bedtime.   Yes Historical Provider, MD  rivaroxaban (XARELTO) 20 MG TABS tablet TAKE 1 TABLET BY MOUTH EVERY DAY WITH SUPPER 08/25/15  Yes Shirley Friar, PA-C  simvastatin (ZOCOR) 20 MG tablet TAKE 1 TABLET BY MOUTH AT BEDTIME 10/05/15  Yes Midge Minium, MD    Family History Family History  Problem Relation Age of Onset  . Breast cancer Mother 19  . Alcohol abuse Mother   . Alcohol abuse Father   . Alcohol abuse Maternal Grandfather   . Drug abuse Maternal Grandfather   . Alcohol abuse Maternal Grandmother   . Alcohol abuse Paternal Grandfather   . Alcohol abuse Paternal Grandmother   . Schizophrenia Cousin     Social History Social History  Substance Use Topics  . Smoking status: Never Smoker  . Smokeless tobacco: Never Used  . Alcohol use 0.0 oz/week     Comment: occ.  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   Allergies   Lamictal [lamotrigine] and Eggs or egg-derived products   Review of Systems Review of Systems 10 Systems  reviewed and are negative for acute change except as noted in the HPI.  Physical Exam Updated Vital Signs BP 121/75 (BP Location: Right Arm)   Pulse 73   Temp 97.8 F (36.6 C) (Oral)   Resp 18   SpO2 96%   Physical Exam  Constitutional: She is oriented to person, place, and time. She appears well-developed and well-nourished.  HENT:  Head: Normocephalic and atraumatic.  Nose: Nose normal.  Mouth/Throat: Oropharynx is clear and moist.  Eyes: Conjunctivae and EOM are normal. Pupils are equal, round, and reactive to light. No scleral icterus.  Neck: Normal range of motion. Neck supple. No JVD present.  Cardiovascular: Normal rate and regular rhythm.   No murmur heard. Pulmonary/Chest: Effort normal and breath sounds normal. No respiratory distress. She has no wheezes. She exhibits no tenderness.  Abdominal: Soft. Bowel sounds are normal. She exhibits no distension. There is no tenderness. There is no rebound and no guarding.  Musculoskeletal: Normal range of motion. She exhibits no tenderness.  2+ pitting edema almost up to the knee bilaterally  Lymphadenopathy:    She has no cervical adenopathy.  Neurological: She is alert and oriented to person, place, and time. No cranial nerve deficit. She exhibits normal muscle tone.  Skin: Skin is warm and dry. She is not diaphoretic.  Chronic venous stasis changes in the lower extremities bilaterally  Psychiatric: Her behavior is normal. Thought content normal.  Appears mildly anxious     ED Treatments / Results  Labs (all labs ordered are listed, but only abnormal results are displayed) Labs Reviewed  BASIC METABOLIC PANEL - Abnormal; Notable for the following:       Result Value   Chloride 100 (*)    Glucose, Bld 101 (*)    Creatinine, Ser 1.02 (*)    GFR calc non Af Amer 59 (*)    All other components within normal limits  CBC - Abnormal; Notable for the following:    RBC 5.29 (*)    Hemoglobin 15.9 (*)    HCT 46.9 (*)  All  other components within normal limits  I-STAT TROPOININ, ED    EKG  EKG Interpretation  Date/Time:  Wednesday December 29 2015 13:26:06 EST Ventricular Rate:  82 PR Interval:    QRS Duration: 86 QT Interval:  387 QTC Calculation: 452 R Axis:   51 Text Interpretation:  Sinus rhythm No significant change since last tracing Confirmed by LITTLE MD, RACHEL [phone]) on 12/29/2015 1:37:16 PM       Radiology Dg Chest 2 View  Result Date: 12/29/2015 CLINICAL DATA:  Upper left chest pain EXAM: CHEST  2 VIEW COMPARISON:  08/04/2015 FINDINGS: Stable scarring at the left base, mild. There is no edema, consolidation, effusion, or pneumothorax. Normal heart size and mediastinal contours. IMPRESSION: No evidence of active disease. Electronically Signed   By: Monte Fantasia M.D.   On: 12/29/2015 14:18    Procedures Procedures (including critical care time)  Medications Ordered in ED Medications - No data to display   Initial Impression / Assessment and Plan / ED Course  I have reviewed the triage vital signs and the nursing notes.  Pertinent labs & imaging results that were available during my care of the patient were reviewed by me and considered in my medical decision making (see chart for details).  Clinical Course    Kail is a 60 year old female with a PMH of NSTEMI with occlusion of septal perforator 2/2 spontaneous coronary dissection in 2009, HFpEF, pAF on Xarelto, HTN, HLD, and morbid obesity presenting to the ED with left sided chest pain. She has many features concerning for ACS, including chest pain that is worse with exertion and better with rest. She took aspirin 325 mg at home, which helped her chest pain. Her pain is also associated with nausea and shortness of breath. Heart score is a 6. She may be having some demand ischemia secondary to a mild CHF exacerbation. She has gained 12 pounds in last week and notes new two-pillow orthopnea and worsening lower extremity edema. Think  PE is less likely, as she is compliant with her Xarelto. [place] score is 1.5, for her history of previous DVT. Given her significant cardiac history with a heart score of 6, will consult cardiology for likely admission.  2:30PM: Initial labs with trop 0.00. CXR negative. EKG with new ST elevation in lead III.   2:45PM: Discussed patient with cardiology. They will admit her. Plan for cardiac cath tomorrow.  Final Clinical Impressions(s) / ED Diagnoses   Final diagnoses:  Exertional chest pain  Orthopnea  Exertional dyspnea    New Prescriptions New Prescriptions   No medications on file     Sela Hua, MD 12/29/15 [place], MD 12/29/15 [place], MD 12/29/15 [place], MD 12/29/15 1555

## 2015-12-29 NOTE — Telephone Encounter (Signed)
Patient calling with recurrent and worsening constant CP behind left breast and generally not feeling well. Advised to call 911. Will notify CHF team.  Renee Pain, RN

## 2015-12-29 NOTE — ED Notes (Signed)
Patient physically moved from hallway to B14; patient undressed, in gown, on monitor, continuous pulse oximetry and blood pressure cuff
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
## 2015-12-30 ENCOUNTER — Encounter (HOSPITAL_COMMUNITY)
Admission: EM | Disposition: A | Discharge status: Home / Self Care | Payer: Self-pay | Source: Home / Self Care | Attending: Emergency Medicine

## 2015-12-30 DIAGNOSIS — R079 Chest pain, unspecified: Secondary | ICD-10-CM | POA: Diagnosis not present

## 2015-12-30 DIAGNOSIS — I110 Hypertensive heart disease with heart failure: Secondary | ICD-10-CM | POA: Diagnosis not present

## 2015-12-30 DIAGNOSIS — E785 Hyperlipidemia, unspecified: Secondary | ICD-10-CM | POA: Diagnosis not present

## 2015-12-30 DIAGNOSIS — R609 Edema, unspecified: Secondary | ICD-10-CM

## 2015-12-30 DIAGNOSIS — I5032 Chronic diastolic (congestive) heart failure: Secondary | ICD-10-CM

## 2015-12-30 DIAGNOSIS — I10 Essential (primary) hypertension: Secondary | ICD-10-CM | POA: Diagnosis not present

## 2015-12-30 DIAGNOSIS — E7800 Pure hypercholesterolemia, unspecified: Secondary | ICD-10-CM | POA: Diagnosis not present

## 2015-12-30 HISTORY — PX: CARDIAC CATHETERIZATION: SHX172

## 2015-12-30 LAB — LIPID PANEL
Cholesterol: 193 mg/dL (ref 0–200)
HDL: 37 mg/dL — AB (ref >40)
LDL Cholesterol: 121 mg/dL — ABNORMAL HIGH (ref 0–99)
Total CHOL/HDL Ratio: 5.2 RATIO
Triglycerides: 176 mg/dL — ABNORMAL HIGH (ref <150)
VLDL: 35 mg/dL (ref 0–40)

## 2015-12-30 LAB — BASIC METABOLIC PANEL
ANION GAP: 10 (ref 5–15)
BUN: 21 mg/dL — ABNORMAL HIGH (ref 6–20)
CALCIUM: 9 mg/dL (ref 8.9–10.3)
CO2: 27 mmol/L (ref 22–32)
CREATININE: 1.18 mg/dL — AB (ref 0.44–1.00)
Chloride: 101 mmol/L (ref 101–111)
GFR, EST AFRICAN AMERICAN: 57 mL/min — AB (ref >60)
GFR, EST NON AFRICAN AMERICAN: 49 mL/min — AB (ref >60)
GLUCOSE: 144 mg/dL — AB (ref 65–99)
Potassium: 3.9 mmol/L (ref 3.5–5.1)
Sodium: 138 mmol/L (ref 135–145)

## 2015-12-30 LAB — CBC
HCT: 37.9 % (ref 36.0–46.0)
HEMATOCRIT: 43.7 % (ref 36.0–46.0)
HEMOGLOBIN: 14.8 g/dL (ref 12.0–15.0)
Hemoglobin: 13 g/dL (ref 12.0–15.0)
MCH: 30 pg (ref 26.0–34.0)
MCH: 30.7 pg (ref 26.0–34.0)
MCHC: 33.9 g/dL (ref 30.0–36.0)
MCHC: 34.3 g/dL (ref 30.0–36.0)
MCV: 88.6 fL (ref 78.0–100.0)
MCV: 89.4 fL (ref 78.0–100.0)
PLATELETS: 134 10*3/uL — AB (ref 150–400)
Platelets: 141 10*3/uL — ABNORMAL LOW (ref 150–400)
RBC: 4.93 MIL/uL (ref 3.87–5.11)
RBC: 4.24 MIL/uL (ref 3.87–5.11)
RDW: 13.8 % (ref 11.5–15.5)
RDW: 14.1 % (ref 11.5–15.5)
WBC: 4.9 10*3/uL (ref 4.0–10.5)
WBC: 4.3 10*3/uL (ref 4.0–10.5)

## 2015-12-30 LAB — GLUCOSE, CAPILLARY
Glucose-Capillary: 120 mg/dL — ABNORMAL HIGH (ref 65–99)
Glucose-Capillary: 112 mg/dL — ABNORMAL HIGH (ref 65–99)
Glucose-Capillary: 161 mg/dL — ABNORMAL HIGH (ref 65–99)
Glucose-Capillary: 157 mg/dL — ABNORMAL HIGH (ref 65–99)

## 2015-12-30 LAB — APTT
APTT: 63 s — AB (ref 24–36)
APTT: 131 s — AB (ref 24–36)

## 2015-12-30 LAB — TROPONIN I

## 2015-12-30 LAB — HEPARIN LEVEL (UNFRACTIONATED): HEPARIN UNFRACTIONATED: 0.65 [IU]/mL (ref 0.30–0.70)

## 2015-12-30 LAB — PROTIME-INR
INR: 1.02
Prothrombin Time: 13.4 seconds (ref 11.4–15.2)

## 2015-12-30 LAB — CREATININE, SERUM: Creatinine, Ser: 0.69 mg/dL (ref 0.44–1.00)

## 2015-12-30 SURGERY — LEFT HEART CATH AND CORONARY ANGIOGRAPHY

## 2015-12-30 MED ORDER — OXYCODONE-ACETAMINOPHEN 5-325 MG PO TABS
1.0000 | ORAL_TABLET | ORAL | Status: DC | PRN
Start: 2015-12-30 — End: 2015-12-31

## 2015-12-30 MED ORDER — ACETAMINOPHEN 325 MG PO TABS: 650.0000 mg | ORAL_TABLET | ORAL | Status: DC | PRN

## 2015-12-30 MED ORDER — MIDAZOLAM HCL 2 MG/2ML IJ SOLN
INTRAMUSCULAR | Status: DC | PRN
  Administered 2015-12-30 (×2): 1 mg via INTRAVENOUS

## 2015-12-30 MED ORDER — SODIUM CHLORIDE 0.9% FLUSH
3.0000 mL | Freq: Two times a day (BID) | INTRAVENOUS | Status: DC
  Administered 2015-12-30 – 2015-12-31 (×2): 3 mL via INTRAVENOUS

## 2015-12-30 MED ORDER — ONDANSETRON HCL 4 MG/2ML IJ SOLN: 4.0000 mg | Freq: Four times a day (QID) | INTRAMUSCULAR | Status: DC | PRN

## 2015-12-30 MED ORDER — VERAPAMIL HCL 2.5 MG/ML IV SOLN
INTRAVENOUS | Status: DC | PRN
  Administered 2015-12-30: 10 mL via INTRA_ARTERIAL

## 2015-12-30 MED ORDER — HEPARIN SODIUM (PORCINE) 5000 UNIT/ML IJ SOLN
5000.0000 [IU] | Freq: Three times a day (TID) | INTRAMUSCULAR | Status: AC
  Administered 2015-12-30: 5000 [IU] via SUBCUTANEOUS
  Filled 2015-12-30 (×2): qty 1

## 2015-12-30 MED ORDER — HEPARIN (PORCINE) IN NACL 100-0.45 UNIT/ML-% IJ SOLN
1900.0000 [IU]/h | INTRAMUSCULAR | Status: DC
  Filled 2015-12-30: qty 250

## 2015-12-30 MED ORDER — LIDOCAINE HCL (PF) 1 % IJ SOLN
INTRAMUSCULAR | Status: DC | PRN
  Administered 2015-12-30: 2 mL via INTRADERMAL

## 2015-12-30 MED ORDER — RIVAROXABAN 20 MG PO TABS
20.0000 mg | ORAL_TABLET | Freq: Every day | ORAL | Status: DC
  Administered 2015-12-31: 20 mg via ORAL
  Filled 2015-12-30: qty 1

## 2015-12-30 MED ORDER — FENTANYL CITRATE (PF) 100 MCG/2ML IJ SOLN
INTRAMUSCULAR | Status: DC | PRN
  Administered 2015-12-30: 50 ug via INTRAVENOUS

## 2015-12-30 MED ORDER — HEPARIN (PORCINE) IN NACL 2-0.9 UNIT/ML-% IJ SOLN
INTRAMUSCULAR | Status: DC | PRN
  Administered 2015-12-30: 1000 mL

## 2015-12-30 MED ORDER — SODIUM CHLORIDE 0.9 % IV SOLN: 250.0000 mL | INTRAVENOUS | Status: DC | PRN

## 2015-12-30 MED ORDER — FENTANYL CITRATE (PF) 100 MCG/2ML IJ SOLN
INTRAMUSCULAR | Status: AC
  Filled 2015-12-30: qty 2

## 2015-12-30 MED ORDER — SODIUM CHLORIDE 0.9% FLUSH: 3.0000 mL | INTRAVENOUS | Status: DC | PRN

## 2015-12-30 MED ORDER — HEPARIN SODIUM (PORCINE) 1000 UNIT/ML IJ SOLN
INTRAMUSCULAR | Status: AC
  Filled 2015-12-30: qty 1

## 2015-12-30 MED ORDER — IOPAMIDOL (ISOVUE-370) INJECTION 76%
INTRAVENOUS | Status: DC | PRN
  Administered 2015-12-30: 75 mL via INTRA_ARTERIAL

## 2015-12-30 MED ORDER — SODIUM CHLORIDE 0.9 % IV SOLN
INTRAVENOUS | Status: AC
  Administered 2015-12-30: 16:00:00 via INTRAVENOUS

## 2015-12-30 MED ORDER — VERAPAMIL HCL 2.5 MG/ML IV SOLN
INTRAVENOUS | Status: AC
  Filled 2015-12-30: qty 2

## 2015-12-30 MED ORDER — HEPARIN (PORCINE) IN NACL 2-0.9 UNIT/ML-% IJ SOLN
INTRAMUSCULAR | Status: AC
  Filled 2015-12-30: qty 1000

## 2015-12-30 MED ORDER — MIDAZOLAM HCL 2 MG/2ML IJ SOLN
INTRAMUSCULAR | Status: AC
  Filled 2015-12-30: qty 2

## 2015-12-30 MED ORDER — IOPAMIDOL (ISOVUE-370) INJECTION 76%
INTRAVENOUS | Status: AC
  Filled 2015-12-30: qty 100

## 2015-12-30 MED ORDER — HEPARIN SODIUM (PORCINE) 1000 UNIT/ML IJ SOLN
INTRAMUSCULAR | Status: DC | PRN
  Administered 2015-12-30: 5000 [IU] via INTRAVENOUS

## 2015-12-30 MED ORDER — LIDOCAINE HCL (PF) 1 % IJ SOLN
INTRAMUSCULAR | Status: AC
  Filled 2015-12-30: qty 30

## 2015-12-30 SURGICAL SUPPLY — 10 items
CATH EXPO 5F FL3.5 (CATHETERS) ×1 IMPLANT
CATH INFINITI JR4 5F (CATHETERS) ×1 IMPLANT
DEVICE RAD COMP TR BAND LRG (VASCULAR PRODUCTS) ×1 IMPLANT
GLIDESHEATH SLEND A-KIT 6F 22G (SHEATH) ×1 IMPLANT
GUIDEWIRE INQWIRE 1.5J.035X260 (WIRE) IMPLANT
INQWIRE 1.5J .035X260CM (WIRE) ×2
KIT HEART LEFT (KITS) ×2 IMPLANT
PACK CARDIAC CATHETERIZATION (CUSTOM PROCEDURE TRAY) ×2 IMPLANT
TRANSDUCER W/STOPCOCK (MISCELLANEOUS) ×2 IMPLANT
TUBING CIL FLEX 10 FLL-RA (TUBING) ×2 IMPLANT

## 2015-12-30 NOTE — Progress Notes (Signed)
ANTICOAGULATION CONSULT NOTE - Follow Up Consult  Pharmacy Consult for Heparin Indication: atrial fibrillation  Allergies  Allergen Reactions  . Lamictal [Lamotrigine]     Rash   . Eggs Or Egg-Derived Products Hives    Can eat foods with cooked eggs; CANNOT handle when part of flu vaccine, etc.     Patient Measurements: Height: 6' (182.9 cm) Weight: (!) 329 lb 11.2 oz (149.6 kg) IBW/kg (Calculated) : 73.1 Heparin Dosing Weight:  109 kg  Vital Signs: Temp: 98 F (36.7 C) (12/07 0800) Temp Source: Oral (12/07 0800) BP: 100/48 (12/07 0900) Pulse Rate: 61 (12/07 0900)  Labs:  Recent Labs  12/29/15 1328 12/29/15 1752 12/29/15 2153 12/29/15 2154 12/29/15 2345 12/30/15 0348 12/30/15 0405 12/30/15 1101  HGB 15.9*  --   --   --   --  14.8  --   --   HCT 46.9*  --   --   --   --  43.7  --   --   PLT 155  --   --   --   --  141*  --   --   APTT  --   --  26  --   --   --  63* 131*  LABPROT  --   --   --   --   --   --  13.4  --   INR  --   --   --   --   --   --  1.02  --   HEPARINUNFRC  --   --   --  0.54  --   --  0.65  --   CREATININE 1.02*  --   --   --   --   --  1.18*  --   TROPONINI  --  <0.03  --   --  <0.03  --  <0.03  --     Estimated Creatinine Clearance: 83 mL/min (by C-G formula based on SCr of 1.18 mg/dL (H)).  Assessment:  Anticoag: 60 y.o morbidly obese F with history of Afib on Xarelto PTA. Hold  for heart cath for CP. LD 12/5 @ 18:00. APTT 63 (HL 0.65)>now up to 131  Goal of Therapy:  aPTT 66-102 seconds Monitor platelets by anticoagulation protocol: Yes   Plan:  Going to cath now   [employer]. Alford Highland, PharmD, BCPS Clinical Staff Pharmacist Pager [phone]  Eilene Ghazi Stillinger 12/30/2015,12:48 PM

## 2015-12-30 NOTE — Interval H&P Note (Signed)
Cath Lab Visit (complete for each Cath Lab visit)  Clinical Evaluation Leading to the Procedure:   ACS: Yes.    Non-ACS:    Anginal Classification: CCS III  Anti-ischemic medical therapy: Minimal Therapy (1 class of medications)  Non-Invasive Test Results: Intermediate-risk stress test findings: cardiac mortality 1-3%/year  Prior CABG: No previous CABG      History and Physical Interval Note:  12/30/2015 1:30 PM  Kathleen [name]  has presented today for surgery, with the diagnosis of cp  The various methods of treatment have been discussed with the patient and family. After consideration of risks, benefits and other options for treatment, the patient has consented to  Procedure(s): Left Heart Cath and Coronary Angiography (N/A) as a surgical intervention .  The patient's history has been reviewed, patient examined, no change in status, stable for surgery.  I have reviewed the patient's chart and labs.  Questions were answered to the patient's satisfaction.     Belva Crome III

## 2015-12-30 NOTE — Progress Notes (Signed)
ANTICOAGULATION CONSULT NOTE - Follow Up Consult  Pharmacy Consult for heparin Indication: atrial fibrillation  Labs:  Recent Labs  12/29/15 1328 12/29/15 1752 12/29/15 2153 12/29/15 2154 12/29/15 2345 12/30/15 0348 12/30/15 0405  HGB 15.9*  --   --   --   --  14.8  --   HCT 46.9*  --   --   --   --  43.7  --   PLT 155  --   --   --   --  141*  --   APTT  --   --  26  --   --   --  63*  LABPROT  --   --   --   --   --   --  13.4  INR  --   --   --   --   --   --  1.02  HEPARINUNFRC  --   --   --  0.54  --   --  0.65  CREATININE 1.02*  --   --   --   --   --   --   TROPONINI  --  <0.03  --   --  <0.03  --   --      Assessment: 60yo female subtherapeutic on heparin (using aPTT) with initial dosing while Xarelto on hold.  Goal of Therapy:  aPTT 66-102 seconds   Plan:  Will increase heparin gtt by 2-3 units/kg/hr to 1900 units/hr and check PTT in 6hr.  Wynona Neat, PharmD, BCPS  12/30/2015,4:37 AM

## 2015-12-30 NOTE — Progress Notes (Signed)
Patient Name: Kathleen [name] Date of Encounter: 12/30/2015  Primary Cardiologist: Dr. [facility] Problem List     Active Problems:   Chest pain with moderate risk for cardiac etiology     Subjective   Feeling well.  denies chest pain or shortness of breath.  Inpatient Medications    Scheduled Meds: . [MAR Hold] aspirin  324 mg Oral NOW   Or  . [MAR Hold] aspirin  300 mg Rectal NOW  . [MAR Hold] aspirin EC  81 mg Oral Daily  . [MAR Hold] divalproex  1,500 mg Oral QHS  . [MAR Hold] furosemide  40 mg Oral Daily  . [MAR Hold] insulin aspart  0-15 Units Subcutaneous TID WC  . [MAR Hold] levothyroxine  50 mcg Oral QAC breakfast  . [MAR Hold] lisinopril  10 mg Oral Daily  . [MAR Hold] lurasidone  40 mg Oral QHS  . [MAR Hold] metoprolol succinate  50 mg Oral QHS  . [MAR Hold] OLANZapine  5 mg Oral QHS  . [MAR Hold] simvastatin  20 mg Oral QHS  . sodium chloride flush  3 mL Intravenous Q12H   Continuous Infusions: . sodium chloride 1 mL/kg/hr (12/30/15 0720)  . heparin 1,900 Units/hr (12/30/15 0444)   PRN Meds: sodium chloride, [MAR Hold] acetaminophen, [MAR Hold] albuterol, [MAR Hold] ALPRAZolam, [MAR Hold] nitroGLYCERIN, [MAR Hold] ondansetron (ZOFRAN) IV, sodium chloride flush   Vital Signs    Vitals:   12/29/15 2344 12/30/15 0523 12/30/15 0800 12/30/15 0900  BP: 113/66 102/65  (!) 100/48  Pulse: 74 74 71 61  Resp: 18 20 16 13   Temp: 97.6 F (36.4 C) 97.5 F (36.4 C) 98 F (36.7 C)   TempSrc: Oral Oral Oral   SpO2: 96% 100% 100% 95%  Weight:  (!) 149.6 kg (329 lb 11.2 oz)    Height:        Intake/Output Summary (Last 24 hours) at 12/30/15 1311 Last data filed at 12/30/15 0900  Gross per 24 hour  Intake           270.23 ml  Output             1000 ml  Net          -729.77 ml   Filed Weights   12/29/15 2107 12/30/15 0523  Weight: (!) 150.2 kg (331 lb 1.6 oz) (!) 149.6 kg (329 lb 11.2 oz)    Physical Exam   GEN: Well nourished, well developed,  in no acute distress.  HEENT: Grossly normal.  Neck: Supple, no JVD, carotid bruits, or masses. Cardiac: RRR, no murmurs, rubs, or gallops. No clubbing, cyanosis, edema.  Radials/DP/PT 2+ and equal bilaterally.  Respiratory:  Respirations regular and unlabored, clear to auscultation bilaterally. GI: Soft, nontender, nondistended, BS + x 4. MS: no deformity or atrophy. Skin: warm and dry, no rash. Neuro:  Strength and sensation are intact. Psych: AAOx3.  Normal affect.  Labs    CBC  Recent Labs  12/29/15 1328 12/30/15 0348  WBC 6.3 4.9  HGB 15.9* 14.8  HCT 46.9* 43.7  MCV 88.7 88.6  PLT 155 Q000111Q*   Basic Metabolic Panel  Recent Labs  12/29/15 1328 12/30/15 0405  NA 140 138  K 4.3 3.9  CL 100* 101  CO2 31 27  GLUCOSE 101* 144*  BUN 20 21*  CREATININE 1.02* 1.18*  CALCIUM 9.2 9.0   Liver Function Tests No results for input(s): AST, ALT, ALKPHOS, BILITOT, PROT, ALBUMIN in the last  72 hours. No results for input(s): LIPASE, AMYLASE in the last 72 hours. Cardiac Enzymes  Recent Labs  12/29/15 1752 12/29/15 2345 12/30/15 0405  TROPONINI <0.03 <0.03 <0.03   BNP Invalid input(s): POCBNP D-Dimer No results for input(s): DDIMER in the last 72 hours. Hemoglobin A1C No results for input(s): HGBA1C in the last 72 hours. Fasting Lipid Panel  Recent Labs  12/30/15 0348  CHOL 193  HDL 37*  LDLCALC 121*  TRIG 176*  CHOLHDL 5.2   Thyroid Function Tests No results for input(s): TSH, T4TOTAL, T3FREE, THYROIDAB in the last 72 hours.  Invalid input(s): FREET3  Telemetry    Sinus rhythm.  No events - Personally Reviewed  ECG    12/30/15: Sinus rhythm. Rate 64 bpm. - Personally Reviewed  Radiology    Dg Chest 2 View  Result Date: 12/29/2015 CLINICAL DATA:  Upper left chest pain EXAM: CHEST  2 VIEW COMPARISON:  08/04/2015 FINDINGS: Stable scarring at the left base, mild. There is no edema, consolidation, effusion, or pneumothorax. Normal heart size and  mediastinal contours. IMPRESSION: No evidence of active disease. Electronically Signed   By: Monte Fantasia M.D.   On: 12/29/2015 14:18    Cardiac Studies   Echo 08/05/15: Study Conclusions  - Left ventricle: The cavity size was normal. Systolic function was   normal. The estimated ejection fraction was in the range of 55%   to 60%. Wall motion was normal; there were no regional wall   motion abnormalities. Doppler parameters are consistent with   abnormal left ventricular relaxation (grade 1 diastolic   dysfunction). - Aortic valve: Trileaflet; mildly thickened, mildly calcified   leaflets. - Left atrium: The atrium was mildly dilated. Anterior-posterior   dimension: 45 mm.  Lexiscan Myoview 08/06/15: LVEF 45%. Mild global hypokinesis. Anterior wall ischemia versus breast attenuation artifact.   Patient Profile     Kathleen [name] is a 74F with chronic diastolic heart failure, hypertension, hyperlipidemia, CAD s/p SCAD, here with chest pain.  Assessment & Plan    # Chest pain: # Abnormal stress: Cardiac enzymes are negative and she is currently chest pain-free. However, she has exertional chest pain and a history of coronary artery dissection. Her stress test 07/2015 was equivocal with concern for anterior ischemia versus breast attenuation artifact. Therefore, we will plan for left heart catheterization today.  Continue aspirin, metoprolol, and simvastatin.   # Paroxysmal atrial fibrillation: Xarelto held 12/6 in anticipation of cath today.  She has been in sinus rhythm since admission.  Continue metoprolol.  # Hypertension: BP well-controlled on lisinpril and metoprolol.   # Chronic diastolic heart failure:  Kathleen [name] is euvolemic and BP is controlled.  Hypertension management as above and continue furosemide.   Signed, Skeet Latch, MD  12/30/2015, 1:11 PM

## 2015-12-30 NOTE — H&P (View-Only) (Signed)
Patient Name: Kathleen [name] Date of Encounter: 12/30/2015  Primary Cardiologist: Dr. [facility] Problem List     Active Problems:   Chest pain with moderate risk for cardiac etiology     Subjective   Feeling well.  denies chest pain or shortness of breath.  Inpatient Medications    Scheduled Meds: . [MAR Hold] aspirin  324 mg Oral NOW   Or  . [MAR Hold] aspirin  300 mg Rectal NOW  . [MAR Hold] aspirin EC  81 mg Oral Daily  . [MAR Hold] divalproex  1,500 mg Oral QHS  . [MAR Hold] furosemide  40 mg Oral Daily  . [MAR Hold] insulin aspart  0-15 Units Subcutaneous TID WC  . [MAR Hold] levothyroxine  50 mcg Oral QAC breakfast  . [MAR Hold] lisinopril  10 mg Oral Daily  . [MAR Hold] lurasidone  40 mg Oral QHS  . [MAR Hold] metoprolol succinate  50 mg Oral QHS  . [MAR Hold] OLANZapine  5 mg Oral QHS  . [MAR Hold] simvastatin  20 mg Oral QHS  . sodium chloride flush  3 mL Intravenous Q12H   Continuous Infusions: . sodium chloride 1 mL/kg/hr (12/30/15 0720)  . heparin 1,900 Units/hr (12/30/15 0444)   PRN Meds: sodium chloride, [MAR Hold] acetaminophen, [MAR Hold] albuterol, [MAR Hold] ALPRAZolam, [MAR Hold] nitroGLYCERIN, [MAR Hold] ondansetron (ZOFRAN) IV, sodium chloride flush   Vital Signs    Vitals:   12/29/15 2344 12/30/15 0523 12/30/15 0800 12/30/15 0900  BP: 113/66 102/65  (!) 100/48  Pulse: 74 74 71 61  Resp: 18 20 16 13   Temp: 97.6 F (36.4 C) 97.5 F (36.4 C) 98 F (36.7 C)   TempSrc: Oral Oral Oral   SpO2: 96% 100% 100% 95%  Weight:  (!) 149.6 kg (329 lb 11.2 oz)    Height:        Intake/Output Summary (Last 24 hours) at 12/30/15 1311 Last data filed at 12/30/15 0900  Gross per 24 hour  Intake           270.23 ml  Output             1000 ml  Net          -729.77 ml   Filed Weights   12/29/15 2107 12/30/15 0523  Weight: (!) 150.2 kg (331 lb 1.6 oz) (!) 149.6 kg (329 lb 11.2 oz)    Physical Exam   GEN: Well nourished, well developed,  in no acute distress.  HEENT: Grossly normal.  Neck: Supple, no JVD, carotid bruits, or masses. Cardiac: RRR, no murmurs, rubs, or gallops. No clubbing, cyanosis, edema.  Radials/DP/PT 2+ and equal bilaterally.  Respiratory:  Respirations regular and unlabored, clear to auscultation bilaterally. GI: Soft, nontender, nondistended, BS + x 4. MS: no deformity or atrophy. Skin: warm and dry, no rash. Neuro:  Strength and sensation are intact. Psych: AAOx3.  Normal affect.  Labs    CBC  Recent Labs  12/29/15 1328 12/30/15 0348  WBC 6.3 4.9  HGB 15.9* 14.8  HCT 46.9* 43.7  MCV 88.7 88.6  PLT 155 Q000111Q*   Basic Metabolic Panel  Recent Labs  12/29/15 1328 12/30/15 0405  NA 140 138  K 4.3 3.9  CL 100* 101  CO2 31 27  GLUCOSE 101* 144*  BUN 20 21*  CREATININE 1.02* 1.18*  CALCIUM 9.2 9.0   Liver Function Tests No results for input(s): AST, ALT, ALKPHOS, BILITOT, PROT, ALBUMIN in the last  72 hours. No results for input(s): LIPASE, AMYLASE in the last 72 hours. Cardiac Enzymes  Recent Labs  12/29/15 1752 12/29/15 2345 12/30/15 0405  TROPONINI <0.03 <0.03 <0.03   BNP Invalid input(s): POCBNP D-Dimer No results for input(s): DDIMER in the last 72 hours. Hemoglobin A1C No results for input(s): HGBA1C in the last 72 hours. Fasting Lipid Panel  Recent Labs  12/30/15 0348  CHOL 193  HDL 37*  LDLCALC 121*  TRIG 176*  CHOLHDL 5.2   Thyroid Function Tests No results for input(s): TSH, T4TOTAL, T3FREE, THYROIDAB in the last 72 hours.  Invalid input(s): FREET3  Telemetry    Sinus rhythm.  No events - Personally Reviewed  ECG    12/30/15: Sinus rhythm. Rate 64 bpm. - Personally Reviewed  Radiology    Dg Chest 2 View  Result Date: 12/29/2015 CLINICAL DATA:  Upper left chest pain EXAM: CHEST  2 VIEW COMPARISON:  08/04/2015 FINDINGS: Stable scarring at the left base, mild. There is no edema, consolidation, effusion, or pneumothorax. Normal heart size and  mediastinal contours. IMPRESSION: No evidence of active disease. Electronically Signed   By: Monte Fantasia M.D.   On: 12/29/2015 14:18    Cardiac Studies   Echo 08/05/15: Study Conclusions  - Left ventricle: The cavity size was normal. Systolic function was   normal. The estimated ejection fraction was in the range of 55%   to 60%. Wall motion was normal; there were no regional wall   motion abnormalities. Doppler parameters are consistent with   abnormal left ventricular relaxation (grade 1 diastolic   dysfunction). - Aortic valve: Trileaflet; mildly thickened, mildly calcified   leaflets. - Left atrium: The atrium was mildly dilated. Anterior-posterior   dimension: 45 mm.  Lexiscan Myoview 08/06/15: LVEF 45%. Mild global hypokinesis. Anterior wall ischemia versus breast attenuation artifact.   Patient Profile     Kathleen [name] is a 42F with chronic diastolic heart failure, hypertension, hyperlipidemia, CAD s/p SCAD, here with chest pain.  Assessment & Plan    # Chest pain: # Abnormal stress: Cardiac enzymes are negative and she is currently chest pain-free. However, she has exertional chest pain and a history of coronary artery dissection. Her stress test 07/2015 was equivocal with concern for anterior ischemia versus breast attenuation artifact. Therefore, we will plan for left heart catheterization today.  Continue aspirin, metoprolol, and simvastatin.   # Paroxysmal atrial fibrillation: Xarelto held 12/6 in anticipation of cath today.  She has been in sinus rhythm since admission.  Continue metoprolol.  # Hypertension: BP well-controlled on lisinpril and metoprolol.   # Chronic diastolic heart failure:  Kathleen [name] is euvolemic and BP is controlled.  Hypertension management as above and continue furosemide.   Signed, Skeet Latch, MD  12/30/2015, 1:11 PM

## 2015-12-31 ENCOUNTER — Encounter (HOSPITAL_COMMUNITY): Payer: Self-pay | Admitting: Interventional Cardiology

## 2015-12-31 DIAGNOSIS — I10 Essential (primary) hypertension: Secondary | ICD-10-CM | POA: Diagnosis not present

## 2015-12-31 DIAGNOSIS — R079 Chest pain, unspecified: Secondary | ICD-10-CM | POA: Diagnosis not present

## 2015-12-31 DIAGNOSIS — E119 Type 2 diabetes mellitus without complications: Secondary | ICD-10-CM

## 2015-12-31 DIAGNOSIS — E7800 Pure hypercholesterolemia, unspecified: Secondary | ICD-10-CM | POA: Diagnosis not present

## 2015-12-31 DIAGNOSIS — I5032 Chronic diastolic (congestive) heart failure: Secondary | ICD-10-CM | POA: Diagnosis not present

## 2015-12-31 HISTORY — DX: Type 2 diabetes mellitus without complications: E11.9

## 2015-12-31 LAB — BASIC METABOLIC PANEL
ANION GAP: 10 (ref 5–15)
BUN: 11 mg/dL (ref 6–20)
CHLORIDE: 106 mmol/L (ref 101–111)
CO2: 24 mmol/L (ref 22–32)
Calcium: 9 mg/dL (ref 8.9–10.3)
Creatinine, Ser: 0.89 mg/dL (ref 0.44–1.00)
GFR calc non Af Amer: >60 mL/min (ref >60)
GLUCOSE: 160 mg/dL — AB (ref 65–99)
POTASSIUM: 4.4 mmol/L (ref 3.5–5.1)
Sodium: 140 mmol/L (ref 135–145)

## 2015-12-31 LAB — GLUCOSE, CAPILLARY
Glucose-Capillary: 113 mg/dL — ABNORMAL HIGH (ref 65–99)
Glucose-Capillary: 101 mg/dL — ABNORMAL HIGH (ref 65–99)

## 2015-12-31 LAB — HEMOGLOBIN A1C
Hgb A1c MFr Bld: 6.5 % — ABNORMAL HIGH (ref 4.8–5.6)
Mean Plasma Glucose: 140 mg/dL

## 2015-12-31 MED ORDER — FUROSEMIDE 80 MG PO TABS: 80.0000 mg | ORAL_TABLET | Freq: Every day | ORAL | 5 refills | Status: DC

## 2015-12-31 MED ORDER — SIMVASTATIN 40 MG PO TABS: 40.0000 mg | ORAL_TABLET | Freq: Every day | ORAL | 5 refills | Status: DC

## 2015-12-31 MED ORDER — FUROSEMIDE 80 MG PO TABS: 80.0000 mg | ORAL_TABLET | Freq: Every day | ORAL | Status: DC

## 2015-12-31 MED ORDER — SIMVASTATIN 40 MG PO TABS: 40.0000 mg | ORAL_TABLET | Freq: Every day | ORAL | Status: DC

## 2015-12-31 MED ORDER — LIVING WELL WITH DIABETES BOOK
Freq: Once | Status: DC
  Filled 2015-12-31: qty 1

## 2015-12-31 MED ORDER — FUROSEMIDE 10 MG/ML IJ SOLN
40.0000 mg | Freq: Once | INTRAMUSCULAR | Status: AC
  Administered 2015-12-31: 40 mg via INTRAVENOUS
  Filled 2015-12-31: qty 4

## 2015-12-31 NOTE — Progress Notes (Signed)
Gave patient instructions for Managing Your Diabetes education video when she is ready to watch and ordered education book from pharmacy  [phone].  Nile Prisk S. Alford Highland, PharmD, [place] Clinical Staff Pharmacist Pager [phone]

## 2015-12-31 NOTE — Discharge Summary (Signed)
Discharge Summary    Patient ID: Kathleen [name],  MRN: [id number], DOB/AGE: [birth year] 60 y.o.  Admit date: 12/29/2015 Discharge date: 12/31/2015  Primary Care Provider: Annye Asa Primary Cardiologist: Dr. [name]   Discharge Diagnoses    Principal Problem:   Chest pain with moderate risk for cardiac etiology Active Problems:   Hyperlipidemia   HYPERTENSION, BENIGN ESSENTIAL   Morbid obesity ([place])   Peripheral edema   Chronic diastolic heart failure (HCC)   Type 2 diabetes mellitus (HCC)   Allergies Allergies  Allergen Reactions  . Lamictal [Lamotrigine]     Rash   . Eggs Or Egg-Derived Products Hives    Can eat foods with cooked eggs; CANNOT handle when part of flu vaccine, etc.     Diagnostic Studies/Procedures    Procedures   Left Heart Cath and Coronary Angiography 12/29/16  Conclusion     The left ventricular ejection fraction is 50-55% by visual estimate.  There is no mitral valve regurgitation.    Widely patent coronary arteries. No atherosclerotic obstructions are noted.  Normal left ventricular function. Ejection fraction 55%. Mildly elevated left ventricular end-diastolic pressure.     History of Present Illness     60F with chronic diastolic heart failure, atrial fibrillation on Xarelto, hypertension, hyperlipidemia, CAD s/p SCAD, who presented to [facility] on 12/29/15 with chest pain symptoms concerning for angina. She also complained of exertional dyspnea and mild LEE. Of note, she had recently had a NST 02/2015 that was read as breast attenuation artifact vs. Anterior ischemia. Given the nature of her symptoms, her history of CAD, risk factors and recent equivocal stress test, decision was made to admit for LHC.   Hospital Course     Patient was admitted to telemetry. Her Xarelto was held in anticipation of cath. Cardiac enzymes were cycled and negative x 3. LHC was performed by Dr. Tamala Julian on 12/29/16. She was found to have widely patent  coronaries. No atherosclerotic obstructions noted. LVEF was normal at 55%. However, she was noted to have mildly elevated left ventricular end-diastolic pressure at 16 mmHg. She left the cath lab in stable condition. She was continued on medical therapy. She had no post cath complications. She was given a 1x dose of IV lasix given her mild exertional dyspnea and mildly elevated LVEDP on cath. Her home lasix was increased to 80 mg PO at discharge. Her Xarelto was also resumed. Simvastatin was increased to 40 mg given an LDL of 121 mg/dL. Hgb A1c was also obtained and was abnormal at 6.5, diagnostic for DM. Pt was notified and instructed to f/u with her PCP for further management/ monitoring. The patient was last seen and examined by Dr. Oval Linsey, who determined she was stable for d/c home. She will f/u in HF clinic with Dr. [name] Darrick Grinder, NP. She will need a f/u BMP in 1 week. SCr day of discharge was 0.89.   Consultants: none    Discharge Vitals Blood pressure 117/68, pulse 76, temperature 98.2 F (36.8 C), temperature source Oral, resp. rate 18, height 6' (1.829 m), weight (!) 334 lb 1.6 oz (151.5 kg), SpO2 94 %.  Filed Weights   12/29/15 2107 12/30/15 0523 12/31/15 0400  Weight: (!) 331 lb 1.6 oz (150.2 kg) (!) 329 lb 11.2 oz (149.6 kg) (!) 334 lb 1.6 oz (151.5 kg)    Labs & Radiologic Studies    CBC  Recent Labs  12/30/15 0348 12/30/15 1446  WBC 4.9 4.3  HGB 14.8  13.0  HCT 43.7 37.9  MCV 88.6 89.4  PLT 141* Q000111Q*   Basic Metabolic Panel  Recent Labs  12/30/15 0405 12/30/15 1446 12/31/15 1008  NA 138  --  140  K 3.9  --  4.4  CL 101  --  106  CO2 27  --  24  GLUCOSE 144*  --  160*  BUN 21*  --  11  CREATININE 1.18* 0.69 0.89  CALCIUM 9.0  --  9.0   Liver Function Tests No results for input(s): AST, ALT, ALKPHOS, BILITOT, PROT, ALBUMIN in the last 72 hours. No results for input(s): LIPASE, AMYLASE in the last 72 hours. Cardiac Enzymes  Recent Labs   12/29/15 1752 12/29/15 2345 12/30/15 0405  TROPONINI <0.03 <0.03 <0.03   BNP Invalid input(s): POCBNP D-Dimer No results for input(s): DDIMER in the last 72 hours. Hemoglobin A1C  Recent Labs  12/30/15 0348  HGBA1C 6.5*   Fasting Lipid Panel  Recent Labs  12/30/15 0348  CHOL 193  HDL 37*  LDLCALC 121*  TRIG 176*  CHOLHDL 5.2   Thyroid Function Tests No results for input(s): TSH, T4TOTAL, T3FREE, THYROIDAB in the last 72 hours.  Invalid input(s): FREET3 _____________  Dg Chest 2 View  Result Date: 12/29/2015 CLINICAL DATA:  Upper left chest pain EXAM: CHEST  2 VIEW COMPARISON:  08/04/2015 FINDINGS: Stable scarring at the left base, mild. There is no edema, consolidation, effusion, or pneumothorax. Normal heart size and mediastinal contours. IMPRESSION: No evidence of active disease. Electronically Signed   By: Monte Fantasia M.D.   On: 12/29/2015 14:18   Disposition   Pt is being discharged home today in good condition.  Follow-up Plans & Appointments    Follow-up Information    Glori Bickers, MD Follow up.   Specialty:  Cardiology Why:  our office will call you with a follow-up appt Contact information: [place] [address] [phone]        Glori Bickers, MD Follow up.   Specialty:  Cardiology Contact information: [address] [place] [address] [phone]          Discharge Instructions    Diet - low sodium heart healthy    Complete by:  As directed    Increase activity slowly    Complete by:  As directed       Discharge Medications   Current Discharge Medication List    CONTINUE these medications which have CHANGED   Details  furosemide (LASIX) 80 MG tablet Take 1 tablet (80 mg total) by mouth daily. Qty: 30 tablet, Refills: 5    simvastatin (ZOCOR) 40 MG tablet Take 1 tablet (40 mg total) by mouth at bedtime. Qty: 30 tablet, Refills: 5      CONTINUE these  medications which have NOT CHANGED   Details  albuterol (PROVENTIL HFA;VENTOLIN HFA) 108 (90 BASE) MCG/ACT inhaler Inhale 2 puffs into the lungs every 6 (six) hours as needed for wheezing. Qty: 1 Inhaler, Refills: 1    ALPRAZolam (XANAX) 0.5 MG tablet Take 0.5 mg by mouth 2 (two) times daily as needed for anxiety.    divalproex (DEPAKOTE ER) 500 MG 24 hr tablet Take 1,500 mg by mouth at bedtime.     levothyroxine (SYNTHROID, LEVOTHROID) 50 MCG tablet Take 1 tablet (50 mcg total) by mouth daily. Qty: 30 tablet, Refills: 3    lisinopril (ZESTRIL) 10 MG tablet Take 1 tablet (10 mg total) by mouth daily. Qty:  90 tablet, Refills: 3   Associated Diagnoses: Chronic diastolic heart failure (HCC)    lurasidone (LATUDA) 40 MG TABS tablet Take 40 mg by mouth at bedtime.     metoprolol succinate (TOPROL-XL) 50 MG 24 hr tablet TAKE 1 TABLET BY MOUTH DAILY WITH OR IMMEDIATELY FOLLOWING A MEAL Qty: 30 tablet, Refills: 5    nitroGLYCERIN (NITROSTAT) 0.4 MG SL tablet Place 1 tablet (0.4 mg total) under the tongue every 5 (five) minutes as needed for chest pain. For chest pain Qty: 25 tablet, Refills: 3    OLANZapine (ZYPREXA) 5 MG tablet Take 5 mg by mouth at bedtime.    rivaroxaban (XARELTO) 20 MG TABS tablet TAKE 1 TABLET BY MOUTH EVERY DAY WITH SUPPER Qty: 30 tablet, Refills: 3           Outstanding Labs/Studies   BMP in 1 week   Duration of Discharge Encounter   Greater than 30 minutes including physician time.  Signed, Lyda Jester PA-C 12/31/2015, 2:09 PM   Patient seen and examined.  Plan as discussed in my rounding note for today and outlined above.   Amai Cappiello C. Oval Linsey, MD, [facility]  01/04/2016   11:02 PM

## 2015-12-31 NOTE — Progress Notes (Signed)
Patient Name: Kathleen [name] Date of Encounter: 12/31/2015  Primary Cardiologist: Dr. Roe Coombs Problem List     Principal Problem:   Chest pain with moderate risk for cardiac etiology Active Problems:   Hyperlipidemia   HYPERTENSION, BENIGN ESSENTIAL   Morbid obesity ([place])   Peripheral edema   Chronic diastolic heart failure ([place])   Type 2 diabetes mellitus [facility])    Patient Profile     Kathleen [name] is a 72F with chronic diastolic heart failure, hypertension, hyperlipidemia, CAD s/p SCAD, here with chest pain.   Subjective   Chest pain resolved. She notes recent increased exertional dyspnea and tight fitting shoes due to edema. No resting dyspnea.   Inpatient Medications    Scheduled Meds: . aspirin EC  81 mg Oral Daily  . divalproex  1,500 mg Oral QHS  . furosemide  40 mg Oral Daily  . insulin aspart  0-15 Units Subcutaneous TID WC  . levothyroxine  50 mcg Oral QAC breakfast  . lisinopril  10 mg Oral Daily  . lurasidone  40 mg Oral QHS  . metoprolol succinate  50 mg Oral QHS  . OLANZapine  5 mg Oral QHS  . rivaroxaban  20 mg Oral Q breakfast  . simvastatin  20 mg Oral QHS  . sodium chloride flush  3 mL Intravenous Q12H   Continuous Infusions:  PRN Meds: sodium chloride, acetaminophen, albuterol, ALPRAZolam, nitroGLYCERIN, ondansetron (ZOFRAN) IV, oxyCODONE-acetaminophen, sodium chloride flush   Vital Signs    Vitals:   12/30/15 2100 12/31/15 0004 12/31/15 0400 12/31/15 0742  BP: (!) 148/126 131/68 122/61 112/72  Pulse: 80 70 62 76  Resp: 20 20 (!) 21 11  Temp: 97.6 F (36.4 C) 98 F (36.7 C) 97.4 F (36.3 C) 98.1 F (36.7 C)  TempSrc:    Oral  SpO2: 95% 95% 96% 95%  Weight:   (!) 334 lb 1.6 oz (151.5 kg)   Height:        Intake/Output Summary (Last 24 hours) at 12/31/15 0859 Last data filed at 12/31/15 0825  Gross per 24 hour  Intake             1436 ml  Output              650 ml  Net              786 ml   Filed Weights   12/29/15  2107 12/30/15 0523 12/31/15 0400  Weight: (!) 331 lb 1.6 oz (150.2 kg) (!) 329 lb 11.2 oz (149.6 kg) (!) 334 lb 1.6 oz (151.5 kg)    Physical Exam   GEN: Well nourished, well developed, in no acute distress. Obese HEENT: Grossly normal.  Neck: Supple, no JVD, carotid bruits, or masses. Cardiac: RRR, no murmurs, rubs, or gallops. No clubbing, cyanosis, edema.  Radials/DP/PT 2+ and equal bilaterally.  Respiratory:  Respirations regular and unlabored, clear to auscultation bilaterally. GI: Soft, nontender, nondistended, BS + x 4. MS: no deformity or atrophy. Skin: warm and dry, no rash. Neuro:  Strength and sensation are intact. Psych: AAOx3.  Normal affect.  Labs    CBC  Recent Labs  12/30/15 0348 12/30/15 1446  WBC 4.9 4.3  HGB 14.8 13.0  HCT 43.7 37.9  MCV 88.6 89.4  PLT 141* Q000111Q*   Basic Metabolic Panel  Recent Labs  12/29/15 1328 12/30/15 0405 12/30/15 1446  NA 140 138  --   K 4.3 3.9  --   CL  100* 101  --   CO2 31 27  --   GLUCOSE 101* 144*  --   BUN 20 21*  --   CREATININE 1.02* 1.18* 0.69  CALCIUM 9.2 9.0  --    Liver Function Tests No results for input(s): AST, ALT, ALKPHOS, BILITOT, PROT, ALBUMIN in the last 72 hours. No results for input(s): LIPASE, AMYLASE in the last 72 hours. Cardiac Enzymes  Recent Labs  12/29/15 1752 12/29/15 2345 12/30/15 0405  TROPONINI <0.03 <0.03 <0.03   BNP Invalid input(s): POCBNP D-Dimer No results for input(s): DDIMER in the last 72 hours. Hemoglobin A1C  Recent Labs  12/30/15 0348  HGBA1C 6.5*   Fasting Lipid Panel  Recent Labs  12/30/15 0348  CHOL 193  HDL 37*  LDLCALC 121*  TRIG 176*  CHOLHDL 5.2   Thyroid Function Tests No results for input(s): TSH, T4TOTAL, T3FREE, THYROIDAB in the last 72 hours.  Invalid input(s): FREET3  Telemetry    NSR - Personally Reviewed    Radiology    Dg Chest 2 View  Result Date: 12/29/2015 CLINICAL DATA:  Upper left chest pain EXAM: CHEST  2 VIEW  COMPARISON:  08/04/2015 FINDINGS: Stable scarring at the left base, mild. There is no edema, consolidation, effusion, or pneumothorax. Normal heart size and mediastinal contours. IMPRESSION: No evidence of active disease. Electronically Signed   By: Monte Fantasia M.D.   On: 12/29/2015 14:18    Cardiac Studies    Echo 08/05/15: Study Conclusions  - Left ventricle: The cavity size was normal. Systolic function was normal. The estimated ejection fraction was in the range of 55% to 60%. Wall motion was normal; there were no regional wall motion abnormalities. Doppler parameters are consistent with abnormal left ventricular relaxation (grade 1 diastolic dysfunction). - Aortic valve: Trileaflet; mildly thickened, mildly calcified leaflets. - Left atrium: The atrium was mildly dilated. Anterior-posterior dimension: 45 mm.  Lexiscan Myoview 08/06/15: LVEF 45%. Mild global hypokinesis. Anterior wall ischemia versus breast attenuation artifact.  LHC 12/30/15 Conclusion     The left ventricular ejection fraction is 50-55% by visual estimate.  There is no mitral valve regurgitation.    Widely patent coronary arteries. No atherosclerotic obstructions are noted.  Normal left ventricular function. Ejection fraction 55%. Mildly elevated left ventricular end-diastolic pressure.      Patient Profile     Kathleen [name] is a 85F with chronic diastolic heart failure, hypertension, hyperlipidemia, CAD s/p SCAD, here with chest pain.  Assessment & Plan     1. Chest Pain: Cardiac enzymes negative. Stress test 07/2015 was equivocal with concern for anterior ischemia versus breast attenuation artifact. LHC 12/30/15 showed widely patent coronary arteries. EF normal at 55% with mild elevated LVEDP. Noncardiac CP resolved. Continue ASA, metoprolol and simvastatin.   2. PAF: NSR on telemetry. HR is controlled with metoprolol. Resume Xarelto tonight (was held for cath).  3. HTN: BP is  well controlled on metoprolol and lisinopril  4. Chronic Diastolic HF: mildly elevated LVEDP on cath yesterday at 16 mmHg. She notes recent symptoms of increase dyspnea and difficulty fitting in her shoes due to pedal edema. Consider a low dose of IV lasix prior to discharge. Continue home lasix at discharge, 40 mg. BP is controlled. She is on a BB.   5. HLD: LDL is elevated at 121 mg/dL. Consider increasing simvastatin to 40 mg   6. T2DM: new diagnosis. Hgb A1c is 6.5 Pt notified of results and diagnosis. She will need to f/u with PCP  for further monitoring. Given level is < 7.0, may be reasonable to try controlling through diet and exercise. She is already on an ACE-I and statin.   Dispo: likely d/c home today. MD to assess.    Signed, Lyda Jester, PA-C  12/31/2015, 8:59 AM

## 2015-12-31 NOTE — Care Management Note (Signed)
Case Management Note  Patient Details  Name: Kathleen [name] MRN: [id number] Date of Birth: 09/05/1955  Subjective/Objective:    Patient is for dc today, patient states she may need asisstance with transportation , left message for CSW to speak with patient about transportation.                Action/Plan:   Expected Discharge Date:                  Expected Discharge Plan:  Home/Self Care  In-House Referral:     Discharge planning Services  CM Consult  Post Acute Care Choice:    Choice offered to:     DME Arranged:    DME Agency:     HH Arranged:    HH Agency:     Status of Service:  Completed, signed off  If discussed at [employer] of Stay Meetings, dates discussed:    Additional Comments:  Zenon Mayo, RN 12/31/2015, 3:43 PM

## 2015-12-31 NOTE — Discharge Instructions (Signed)

## 2016-01-07 ENCOUNTER — Encounter (HOSPITAL_COMMUNITY): Payer: Self-pay

## 2016-01-07 ENCOUNTER — Telehealth (HOSPITAL_COMMUNITY): Payer: Self-pay | Admitting: Vascular Surgery

## 2016-01-07 NOTE — Telephone Encounter (Signed)
Left pt message to resch canceled appt 01/07/16

## 2016-01-13 ENCOUNTER — Ambulatory Visit (HOSPITAL_COMMUNITY)
Admission: RE | Admit: 2016-01-13 | Discharge: 2016-01-13 | Disposition: A | Discharge status: Home / Self Care | Payer: BC Managed Care – PPO | Source: Ambulatory Visit | Attending: Internal Medicine | Admitting: Internal Medicine

## 2016-01-13 VITALS — BP 127/72 | HR 86 | Wt 338.1 lb

## 2016-01-13 DIAGNOSIS — I10 Essential (primary) hypertension: Secondary | ICD-10-CM | POA: Diagnosis not present

## 2016-01-13 DIAGNOSIS — F419 Anxiety disorder, unspecified: Secondary | ICD-10-CM | POA: Insufficient documentation

## 2016-01-13 DIAGNOSIS — I5032 Chronic diastolic (congestive) heart failure: Secondary | ICD-10-CM | POA: Diagnosis not present

## 2016-01-13 DIAGNOSIS — I480 Paroxysmal atrial fibrillation: Secondary | ICD-10-CM | POA: Diagnosis not present

## 2016-01-13 DIAGNOSIS — Z85828 Personal history of other malignant neoplasm of skin: Secondary | ICD-10-CM | POA: Diagnosis not present

## 2016-01-13 DIAGNOSIS — I110 Hypertensive heart disease with heart failure: Secondary | ICD-10-CM | POA: Diagnosis not present

## 2016-01-13 DIAGNOSIS — I2510 Atherosclerotic heart disease of native coronary artery without angina pectoris: Secondary | ICD-10-CM | POA: Insufficient documentation

## 2016-01-13 DIAGNOSIS — R0683 Snoring: Secondary | ICD-10-CM | POA: Insufficient documentation

## 2016-01-13 DIAGNOSIS — F329 Major depressive disorder, single episode, unspecified: Secondary | ICD-10-CM | POA: Diagnosis not present

## 2016-01-13 DIAGNOSIS — I252 Old myocardial infarction: Secondary | ICD-10-CM | POA: Insufficient documentation

## 2016-01-13 DIAGNOSIS — Z7982 Long term (current) use of aspirin: Secondary | ICD-10-CM | POA: Insufficient documentation

## 2016-01-13 DIAGNOSIS — E213 Hyperparathyroidism, unspecified: Secondary | ICD-10-CM | POA: Diagnosis not present

## 2016-01-13 DIAGNOSIS — Z86718 Personal history of other venous thrombosis and embolism: Secondary | ICD-10-CM | POA: Diagnosis not present

## 2016-01-13 DIAGNOSIS — Z8249 Family history of ischemic heart disease and other diseases of the circulatory system: Secondary | ICD-10-CM | POA: Diagnosis not present

## 2016-01-13 DIAGNOSIS — Z7902 Long term (current) use of antithrombotics/antiplatelets: Secondary | ICD-10-CM | POA: Insufficient documentation

## 2016-01-13 DIAGNOSIS — E785 Hyperlipidemia, unspecified: Secondary | ICD-10-CM | POA: Diagnosis not present

## 2016-01-13 DIAGNOSIS — J45909 Unspecified asthma, uncomplicated: Secondary | ICD-10-CM | POA: Diagnosis not present

## 2016-01-13 DIAGNOSIS — E119 Type 2 diabetes mellitus without complications: Secondary | ICD-10-CM | POA: Diagnosis not present

## 2016-01-13 DIAGNOSIS — Z6841 Body Mass Index (BMI) 40.0 and over, adult: Secondary | ICD-10-CM | POA: Diagnosis not present

## 2016-01-13 DIAGNOSIS — Z7901 Long term (current) use of anticoagulants: Secondary | ICD-10-CM | POA: Diagnosis not present

## 2016-01-13 LAB — BASIC METABOLIC PANEL
Anion gap: 10 (ref 5–15)
BUN: 18 mg/dL (ref 6–20)
CHLORIDE: 102 mmol/L (ref 101–111)
CO2: 27 mmol/L (ref 22–32)
CREATININE: 1.13 mg/dL — AB (ref 0.44–1.00)
Calcium: 8.8 mg/dL — ABNORMAL LOW (ref 8.9–10.3)
GFR calc Af Amer: 60 mL/min — ABNORMAL LOW (ref >60)
GFR calc non Af Amer: 52 mL/min — ABNORMAL LOW (ref >60)
GLUCOSE: 246 mg/dL — AB (ref 65–99)
Potassium: 4.2 mmol/L (ref 3.5–5.1)
Sodium: 139 mmol/L (ref 135–145)

## 2016-01-13 MED ORDER — FUROSEMIDE 80 MG PO TABS: 80.0000 mg | ORAL_TABLET | Freq: Every day | ORAL | 5 refills | Status: DC

## 2016-01-13 NOTE — Patient Instructions (Signed)
Routine lab work today. Will notify you of abnormal results, otherwise no news is good news!  Continue Lasix 80 mg (1 tablet) once daily. May take EXTRA 1/2 tablet (40 mg) once daily if weight 339 lbs or more.  Follow up 3 months with [name] Clegg NP-C.  Do the following things EVERYDAY: 1) Weigh yourself in the morning before breakfast. Write it down and keep it in a log. 2) Take your medicines as prescribed 3) Eat low salt foods-Limit salt (sodium) to 2000 mg per day.  4) Stay as active as you can everyday 5) Limit all fluids for the day to less than 2 liters

## 2016-01-13 NOTE — Progress Notes (Signed)
Patient ID: KC PALMS, female   DOB: 01/27/1955, 60 y.o.   MRN: [id number]    Advanced Heart Failure Clinic Note   Primary HF MD: Dr [name] PCP: Dr [name].   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 Sleep study 11/2013 was negative. Also has history of A fib.    Had NSTEMI in 2/09 had cath at that time with occlusion of septal perforator. Otherwise normal arteries. Was admitted for CP in 11/13. CE normal. Underwent dobutamine stress echo which was normal.   Admitted 09/11/14 for Afib RVR. She converted to sinus tach with dilt drip and was transitioned to Toprol-XL 25 mg daily. She was in NSR at the time of discharge. Discharge weight was 325 lb.  Admitted 7/12-7/14/17 with chest pain. CTA negative for PE. Troponins negative.  Had Myoview 08/05/15 with "subtle" anterior wall defect. Reviewed by Dr. [name] at visit 08/25/15, thought to possibly be breast attenuation. Echo stable as below.   Admitted 12/6-12/82017 with CP and dyspnea. Cardiac work up was negative. Discharge weight was 334 pounds.   She returns for post hospital follow up. Overall feeling. Denies chills/fever. SOB with exertion. Not mobile. Weight at home to 336 pounds. Eats take out nightly. She does not follow a particular diet. 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     Echo 11/13 EF 60-65% Mild LAE. RV normal. No PH. No comment on diastolic function.  Echo 09/04/13 EF 55-60%  Grade I DD Echo 08/05/15 EF 55-60%  Grade I DD  Labs:  09/25/13 K 4.1 Creatinine 1.0 , LDL 79 10/21/2014: K 5.4 Creatinine 0.95  02/22/2015: K 4.4 Creatinine 1.08   Allergies  Allergen Reactions  . Lamictal [Lamotrigine]     Rash   . Eggs Or Egg-Derived Products Hives    Can eat foods with cooked eggs; CANNOT handle when part of flu vaccine, etc.     Current Outpatient Prescriptions  Medication Sig Dispense Refill  .  albuterol (PROVENTIL HFA;VENTOLIN HFA) 108 (90 BASE) MCG/ACT inhaler Inhale 2 puffs into the lungs every 6 (six) hours as needed for wheezing. 1 Inhaler 1  . ALPRAZolam (XANAX) 0.5 MG tablet Take 0.5 mg by mouth 2 (two) times daily as needed for anxiety.    . divalproex (DEPAKOTE ER) 500 MG 24 hr tablet Take 1,500 mg by mouth at bedtime.     . furosemide (LASIX) 80 MG tablet Take 1 tablet (80 mg total) by mouth daily. 30 tablet 5  . levothyroxine (SYNTHROID, LEVOTHROID) 50 MCG tablet Take 1 tablet (50 mcg total) by mouth daily. 30 tablet 3  . lisinopril (ZESTRIL) 10 MG tablet Take 1 tablet (10 mg total) by mouth daily. 90 tablet 3  . lurasidone (LATUDA) 40 MG TABS tablet Take 40 mg by mouth at bedtime.     . metoprolol succinate (TOPROL-XL) 50 MG 24 hr tablet TAKE 1 TABLET BY MOUTH DAILY WITH OR IMMEDIATELY FOLLOWING A MEAL 30 tablet 5  . nitroGLYCERIN (NITROSTAT) 0.4 MG SL tablet Place 1 tablet (0.4 mg total) under the tongue every 5 (five) minutes as needed for chest pain. For chest pain 25 tablet 3  . OLANZapine (ZYPREXA) 5 MG tablet Take 5 mg by mouth at bedtime.    . rivaroxaban (XARELTO) 20 MG TABS tablet TAKE 1 TABLET BY MOUTH EVERY DAY WITH SUPPER 30 tablet 3  .  simvastatin (ZOCOR) 40 MG tablet Take 1 tablet (40 mg total) by mouth at bedtime. 30 tablet 5   No current facility-administered medications for this encounter.     Past Medical History:  Diagnosis Date  . Anemia   . Anxiety   . Asthma   . Atrial fibrillation ([place])   . Bronchitis    hx of   . Cancer ([place])    basal cell carcinoma on right hand   . CHF (congestive heart failure) ([place])    pt seen at heart/vascular spec clinic 08/14/2014   . Coronary artery disease   . Depression   . Dyslipidemia   . Fall   . Heart attack 02/2007   non-q-wave with second septal perforator  . Hyperlipidemia   . Hypertension   . Knee pain, left   . Lower extremity deep venous thrombosis (HCC)    20 years ago   . Measles    hx of in  childhood   . Morbid obesity with BMI of 40.0-44.9, adult ([place])   . Pneumonia    hx of   . PONV (postoperative nausea and vomiting)   . Psychiatric hospitalization 05/2008  . Shingles    20 years ago   . Shortness of breath dyspnea    exercise   . Type 2 diabetes mellitus ([place]) 12/31/2015  . Urinary incontinence   . Urinary tract bacterial infections     Past Surgical History:  Procedure Laterality Date  . BREAST MASS EXCISION     age 12, benign tumor  . CARDIAC CATHETERIZATION    . CARDIAC CATHETERIZATION N/A 12/30/2015   Procedure: Left Heart Cath and Coronary Angiography;  Surgeon: Belva Crome, MD;  Location: [place] [facility];  Service: Cardiovascular;  Laterality: N/A;  . DILATION AND CURETTAGE OF UTERUS    . KNEE SURGERY     right, removed cartilage  . PARATHYROIDECTOMY N/A 08/25/2014   Procedure: PARATHYROIDECTOMY;  Surgeon: Armandina Gemma, MD;  Location: [facility];  Service: General;  Laterality: N/A;  . TONSILLECTOMY    . TUBAL LIGATION      ROS:  As stated in the HPI and negative for all other systems.  PHYSICAL EXAM BP 127/72 (BP Location: Left Arm, Patient Position: Sitting, Cuff Size: Large)   Pulse 86   Wt (!) 338 lb 2 oz (153.4 kg)   SpO2 96%   BMI 45.86 kg/m    Wt Readings from Last 3 Encounters:  01/13/16 (!) 338 lb 2 oz (153.4 kg)  12/31/15 (!) 334 lb 1.6 oz (151.5 kg)  11/02/15 (!) 331 lb (150.1 kg)    General: Pleasant, NAD. No resp difficulty Psych: Normal affect. HEENT: Normal, without mass or lesion. Neck: Supple. Difficult to assess JVP due to pt size. Carotids 2+. No lymphadenopathy/thyromegaly appreciated. Heart: PMI nondisplaced. RRR no s3, s4, or murmurs appreciated Lungs: Resp regular and unlabored, CTA. Abdomen: Soft, non-tender, non-distended, No HSM, BS + x 4.  Extremities: No clubbing, cyanosis. Trace  ankle edema. Neuro: Alert and oriented X 3. Moves all extremities spontaneously.  ASSESSMENT AND PLAN 1. Chronic diastolic  HF: Normal EF with grade I diastolic dysfunction on echo 09/13/14. NYHA II-III- Volume status stable on exam. Continue lasix to 80 mg daily with an extra 40 mg of lasix for weight 339 pounds.   Reinforced importance of fluid and salt restriction. Pt takes daily rates.  2. PAF. 1st noted during hospital admit 8/16. CHADSVASC = 3. - Regular rate. Continue Toprol XL 50 daily -  Continue Xarelto 20 mg dialy 3. Morbid obesity: - Needs to lose weight.  4. CAD:  - Continue ASA 81, Plavix, statin.   -12//2017 CP work up negative.   5. HTN:   - Stable on current regimen.  She takes her antihypertensive meds at night.  6. Snoring:  - No OSA on sleep study.  7. Hyperparathyroidism-   - s/p partial parathyroidectomy 08/2014. Per PCP.    Check BMET today   Follow up in 3 month  .   Kathleen Mclaurin NP-C  01/13/2016

## 2016-01-18 ENCOUNTER — Encounter: Payer: Self-pay | Admitting: Family Medicine

## 2016-01-18 ENCOUNTER — Ambulatory Visit (INDEPENDENT_AMBULATORY_CARE_PROVIDER_SITE_OTHER): Payer: BC Managed Care – PPO | Admitting: Family Medicine

## 2016-01-18 VITALS — BP 138/82 | HR 89 | Temp 98.1°F | Resp 17 | Ht 72.0 in | Wt 334.0 lb

## 2016-01-18 DIAGNOSIS — E1169 Type 2 diabetes mellitus with other specified complication: Secondary | ICD-10-CM | POA: Diagnosis not present

## 2016-01-18 LAB — TSH: TSH: 1.99 u[IU]/mL (ref 0.35–4.50)

## 2016-01-18 NOTE — Progress Notes (Signed)
Pre visit review using our clinic review tool, if applicable. No additional management support is needed unless otherwise documented below in the visit note. 

## 2016-01-18 NOTE — Assessment & Plan Note (Signed)
New dx for pt.  A1C during recent hospitalization was 6.5- officially placing her in the diabetic range.  She is up to date on eye exam, on ACE for renal protection.  Foot exam done today.  She is asymptomatic at this time.  We spent a lot of the visit discussing a low carb diet, regular exercise.  Will refer to diabetes education.  Pt was given multiple resources on meal planning and carbs.  Questions were answered.  Pt is comfortable with plan at this time.  In 3-4 months, if pt's sugars have not improved or are worsening, we will need to start medication but proceed carefully due to her hx of diastolic heart failure and CAD.

## 2016-01-18 NOTE — Progress Notes (Signed)
   Subjective:    Patient ID: Kathleen [name], female    DOB: [birth year], 60 y.o.   MRN: [id number]  [facility] f/u- pt was admitted 12/6-8 w/ CP.  She had a cath on 12/7 that showed normal EF and patent coronary arteries.  Her home Lasix was increased to 80mg  daily and her Simvastatin was increased to 40mg  daily due to LDL of 121.  During her hospitalization her A1C was found to be 6.5- making her officially diabetic.  Glucose at f/u cards appt was 246 (not fasting)  She is on ACE for renal protection.  UTD on eye exam, due for foot exam.  The thought is that her CP was stress induced.  No numbness/tingling of hands/feet.  No CP, SOB, HAs, visual changes, edema.   Review of Systems For ROS see HPI     Objective:   Physical Exam  Constitutional: She is oriented to person, place, and time. She appears well-developed and well-nourished. No distress.  HENT:  Head: Normocephalic and atraumatic.  Eyes: Conjunctivae and EOM are normal. Pupils are equal, round, and reactive to light.  Neck: Normal range of motion. Neck supple. No thyromegaly present.  Cardiovascular: Normal rate, regular rhythm, normal heart sounds and intact distal pulses.   No murmur heard. Pulmonary/Chest: Effort normal and breath sounds normal. No respiratory distress.  Abdominal: Soft. She exhibits no distension. There is no tenderness.  Musculoskeletal: She exhibits no edema.  Lymphadenopathy:    She has no cervical adenopathy.  Neurological: She is alert and oriented to person, place, and time.  Skin: Skin is warm and dry.  Psychiatric: She has a normal mood and affect. Her behavior is normal.  Vitals reviewed.         Assessment & Plan:

## 2016-01-18 NOTE — Patient Instructions (Signed)
Follow up in 3-4 months to recheck sugars We'll notify you of your lab results and make any changes if needed We'll call you with your Diabetes Education appt Try and work on low carb diet and regular exercise to improve these sugar numbers Call with any questions or concerns Happy New Year!!! HAPPY RETIREMENT!!!

## 2016-01-21 ENCOUNTER — Encounter (HOSPITAL_COMMUNITY): Payer: Self-pay | Admitting: Cardiology

## 2016-01-24 HISTORY — PX: BREAST EXCISIONAL BIOPSY: SUR124

## 2016-01-25 ENCOUNTER — Encounter (HOSPITAL_COMMUNITY): Payer: Self-pay

## 2016-02-01 ENCOUNTER — Telehealth: Payer: Self-pay | Admitting: Family Medicine

## 2016-02-01 ENCOUNTER — Encounter: Payer: Self-pay | Admitting: Family Medicine

## 2016-02-01 ENCOUNTER — Ambulatory Visit (INDEPENDENT_AMBULATORY_CARE_PROVIDER_SITE_OTHER): Payer: BC Managed Care – PPO | Admitting: Family Medicine

## 2016-02-01 VITALS — BP 130/76 | HR 78 | Temp 97.9°F | Resp 16 | Ht 72.0 in | Wt 337.1 lb

## 2016-02-01 DIAGNOSIS — I480 Paroxysmal atrial fibrillation: Secondary | ICD-10-CM | POA: Diagnosis not present

## 2016-02-01 DIAGNOSIS — F3132 Bipolar disorder, current episode depressed, moderate: Secondary | ICD-10-CM | POA: Diagnosis not present

## 2016-02-01 DIAGNOSIS — I5032 Chronic diastolic (congestive) heart failure: Secondary | ICD-10-CM | POA: Diagnosis not present

## 2016-02-01 NOTE — Patient Instructions (Signed)
Follow up as needed/scheduled Submit your FMLA forms- they are active as of today Contact HR or Short term/Long Term disability providers to determine next steps You may want to get supporting letters from cards and psych Call with any questions or concerns Hang in there!!!

## 2016-02-01 NOTE — Progress Notes (Signed)
Pre visit review using our clinic review tool, if applicable. No additional management support is needed unless otherwise documented below in the visit note. 

## 2016-02-01 NOTE — Telephone Encounter (Signed)
Pt would like a note from Dr. [name] stating that her Leave of Absence begins 02-24-16 and ends 07-10-16 faxed to personal fax # [phone] for her to give to [facility]  Thank you.

## 2016-02-01 NOTE — Progress Notes (Signed)
   Subjective:    Patient ID: Kathleen [name], female    DOB: Dec 14, 1955, 61 y.o.   MRN: [id number]  HPI FMLA- pt went to sign retirement papers b/c 'i can't stand school anymore'.  Pt was told that if she retires mid-year, she will lose $8,000/yr.  Pt was told that b/c she can't emotionally or physically 'take' school anymore, she can apply for FMLA and then retire at the end of the year.  'i just can't go back'.  Pt reports she is having swelling due to the heart failure which results in leg pain and inability to walk around the classroom.  + SOB walking around classroom.  She has been written up for not standing and roving around the room.  Pt is not sleeping due to stress of school- was hospitalized in December due to physical toll of stress.  Pt is getting 'so angry' at students/parents/admin that she is getting CP.  + depression, seeing psychiatrist.  'i just can't take' being yelled at.  Pt has known Afib and this will worsen under stress.  + nausea.   Review of Systems For ROS see HPI     Objective:   Physical Exam  Constitutional: She is oriented to person, place, and time. She appears well-developed and well-nourished. No distress.  HENT:  Head: Normocephalic and atraumatic.  Neurological: She is alert and oriented to person, place, and time.  Skin: Skin is warm and dry.  Psychiatric: Her behavior is normal. Thought content normal.  Anxious when talking about her difficulties at school  Vitals reviewed.         Assessment & Plan:  Anxiety/depression- currently exacerbated by her school situation.  Completed forms for FMLA but advised that this will not keep her out until the end of the year and she will need to discuss her options w/ HR for STD or LTD.  Also encouraged her to discuss this w/ psych to get additional letter of support for work.  Pt expressed understanding and is in agreement w/ plan.   CHF- her current condition is preventing her from doing her job at work to the  satisfaction of her supervisor.  She has been written up for her inability to walk around the classroom.  FMLA forms completed but again, recommended she get additional letter of support from cardiology.  Pt expressed understanding and is in agreement w/ plan.

## 2016-02-02 ENCOUNTER — Encounter: Payer: Self-pay | Admitting: Family Medicine

## 2016-02-04 ENCOUNTER — Other Ambulatory Visit (HOSPITAL_COMMUNITY): Payer: Self-pay | Admitting: Student

## 2016-02-04 ENCOUNTER — Other Ambulatory Visit (HOSPITAL_COMMUNITY): Payer: Self-pay | Admitting: Adult Health

## 2016-02-04 DIAGNOSIS — I5032 Chronic diastolic (congestive) heart failure: Secondary | ICD-10-CM

## 2016-02-22 ENCOUNTER — Encounter: Payer: BC Managed Care – PPO | Attending: Family Medicine | Admitting: *Deleted

## 2016-02-22 DIAGNOSIS — Z713 Dietary counseling and surveillance: Secondary | ICD-10-CM | POA: Diagnosis not present

## 2016-02-22 DIAGNOSIS — E119 Type 2 diabetes mellitus without complications: Secondary | ICD-10-CM | POA: Diagnosis present

## 2016-02-24 ENCOUNTER — Ambulatory Visit
Admission: RE | Admit: 2016-02-24 | Discharge: 2016-02-24 | Disposition: A | Discharge status: Home / Self Care | Payer: BC Managed Care – PPO | Source: Ambulatory Visit | Attending: Obstetrics and Gynecology | Admitting: Obstetrics and Gynecology

## 2016-02-24 DIAGNOSIS — Z1231 Encounter for screening mammogram for malignant neoplasm of breast: Secondary | ICD-10-CM

## 2016-02-24 NOTE — Progress Notes (Signed)
Patient was seen on 02/22/2016 for the first of a series of three diabetes self-management courses at the [facility].  Patient Education Plan per assessed needs and concerns is to attend four course education program for Diabetes Self Management Education.  The following learning objectives were met by the patient during this class:  Describe diabetes  State some common risk factors for diabetes  Defines the role of glucose and insulin  Identifies type of diabetes and pathophysiology  Describe the relationship between diabetes and cardiovascular risk  State the members of the Healthcare Team  States the rationale for glucose monitoring  State when to test glucose  State their individual Target Range  State the importance of logging glucose readings  Describe how to interpret glucose readings  Identifies A1C target  Explain the correlation between A1c and eAG values  State symptoms and treatment of high blood glucose  State symptoms and treatment of low blood glucose  Explain proper technique for glucose testing  Identifies proper sharps disposal  Handouts given during class include:  Living Well with Diabetes book  Carb Counting and Meal Planning book  Meal Plan Card  Carbohydrate guide  Meal planning worksheet  Low Sodium Flavoring Tips  The diabetes portion plate  Y7Z to eAG Conversion Chart  Diabetes Medications  Diabetes Recommended Care Schedule  Support Group  Diabetes Success Plan  Core Class Satisfaction Survey  Follow-Up Plan:  Attend core 2

## 2016-02-29 ENCOUNTER — Encounter: Payer: BC Managed Care – PPO | Attending: Family Medicine | Admitting: *Deleted

## 2016-02-29 DIAGNOSIS — E119 Type 2 diabetes mellitus without complications: Secondary | ICD-10-CM | POA: Insufficient documentation

## 2016-02-29 DIAGNOSIS — Z713 Dietary counseling and surveillance: Secondary | ICD-10-CM | POA: Insufficient documentation

## 2016-02-29 NOTE — Progress Notes (Signed)

## 2016-03-07 ENCOUNTER — Encounter: Payer: BC Managed Care – PPO | Admitting: *Deleted

## 2016-03-07 DIAGNOSIS — Z713 Dietary counseling and surveillance: Secondary | ICD-10-CM | POA: Diagnosis not present

## 2016-03-07 DIAGNOSIS — E119 Type 2 diabetes mellitus without complications: Secondary | ICD-10-CM

## 2016-03-07 NOTE — Progress Notes (Signed)
Patient was seen on 03/07/2016 for the third of a series of three diabetes self-management courses at the [facility].   Kathleen [name] the amount of activity recommended for healthy living . Describe activities suitable for individual needs . Identify ways to regularly incorporate activity into daily life . Identify barriers to activity and ways to over come these barriers  Identify diabetes medications being personally used and their primary action for lowering glucose and possible side effects . Describe role of stress on blood glucose and develop strategies to address psychosocial issues . Identify diabetes complications and ways to prevent them  Explain how to manage diabetes during illness . Evaluate success in meeting personal goal . Establish 2-3 goals that they will plan to diligently work on until they return for the  73-month follow-up visit  Goals:   I will count my carb choices at most meals and snacks  To help manage stress I will  exercise at least 3 times a week  Your patient has identified these potential barriers to change:  Stress  Your patient has identified their diabetes self-care support plan as  Friends Plan:  Attend Support Group as desired

## 2016-03-30 ENCOUNTER — Encounter: Payer: Self-pay | Admitting: Family Medicine

## 2016-03-30 ENCOUNTER — Ambulatory Visit (INDEPENDENT_AMBULATORY_CARE_PROVIDER_SITE_OTHER): Payer: BC Managed Care – PPO | Admitting: Family Medicine

## 2016-03-30 VITALS — BP 126/78 | HR 82 | Temp 98.4°F | Resp 16 | Ht 72.0 in | Wt 336.1 lb

## 2016-03-30 DIAGNOSIS — E119 Type 2 diabetes mellitus without complications: Secondary | ICD-10-CM

## 2016-03-30 LAB — BASIC METABOLIC PANEL
BUN: 21 mg/dL (ref 6–23)
CALCIUM: 9.5 mg/dL (ref 8.4–10.5)
CO2: 32 mEq/L (ref 19–32)
CREATININE: 1.09 mg/dL (ref 0.40–1.20)
Chloride: 101 mEq/L (ref 96–112)
GFR: 54.33 mL/min — AB (ref >60.00)
Glucose, Bld: 122 mg/dL — ABNORMAL HIGH (ref 70–99)
Potassium: 4.3 mEq/L (ref 3.5–5.1)
SODIUM: 141 meq/L (ref 135–145)

## 2016-03-30 LAB — HEMOGLOBIN A1C: HEMOGLOBIN A1C: 6.6 % — AB (ref 4.6–6.5)

## 2016-03-30 NOTE — Patient Instructions (Signed)
Schedule your complete physical in 3-4 months We'll notify you of your lab results and make any changes if needed Keep up the good work on healthy diet and regular exercise- you are doing great!!! Call with any questions or concerns Happy Spring!!!

## 2016-03-30 NOTE — Progress Notes (Signed)
   Subjective:    Patient ID: Kathleen [name], female    DOB: 1955-10-14, 61 y.o.   MRN: [id number]  HPI DM- new diagnosis for pt after long hx of pre-diabetes, currently diet controlled and not on meds.  On ACE for renal protection.  UTD on foot exam, eye exam.  Not checking sugars at home.  [facility]  No numbness/tingling of hands/feet.  No CP, SOB, HAs, visual changes, edema.   Review of Systems For ROS see HPI     Objective:   Physical Exam  Constitutional: She is oriented to person, place, and time. She appears well-developed and well-nourished. No distress.  obese  HENT:  Head: Normocephalic and atraumatic.  Eyes: Conjunctivae and EOM are normal. Pupils are equal, round, and reactive to light.  Neck: Normal range of motion. Neck supple. No thyromegaly present.  Cardiovascular: Normal rate, regular rhythm, normal heart sounds and intact distal pulses.   No murmur heard. Pulmonary/Chest: Effort normal and breath sounds normal. No respiratory distress.  Abdominal: Soft. She exhibits no distension. There is no tenderness.  Musculoskeletal: She exhibits no edema.  Lymphadenopathy:    She has no cervical adenopathy.  Neurological: She is alert and oriented to person, place, and time.  Skin: Skin is warm and dry.  Psychiatric: She has a normal mood and affect. Her behavior is normal.  Vitals reviewed.         Assessment & Plan:

## 2016-03-30 NOTE — Assessment & Plan Note (Signed)
Ongoing issue.  Pt has lost 2 lbs [facility]  Applauded her efforts and stressed need for healthy diet.  Will continue to follow closely.

## 2016-03-30 NOTE — Progress Notes (Signed)
Pre visit review using our clinic review tool, if applicable. No additional management support is needed unless otherwise documented below in the visit note. 

## 2016-03-30 NOTE — Assessment & Plan Note (Signed)
New dx for pt at last visit.  She has been to diabetes education.  UTD on foot exam, eye exam.  On ACE for renal protection.  Stressed need for healthy diet and regular exercise.  Check labs.  Adjust tx plan prn.

## 2016-03-31 ENCOUNTER — Telehealth (HOSPITAL_COMMUNITY): Payer: Self-pay | Admitting: *Deleted

## 2016-03-31 NOTE — Telephone Encounter (Signed)
Patient called saying her psychiatrist had prescribed her Cogentin 1 mg from tremors and she wanted to check with Dr. [name] if this was ok for her to take with her heart failure and other medications.  She also asked if we could schedule her next follow up appointment that was due this month.  I have scheduled her a 3 month f/u appointment and she is aware that I will call her back with Dr. Clayborne Dana response regarding her medication.  No further questions at this time.

## 2016-04-01 NOTE — Telephone Encounter (Signed)
Ok by me

## 2016-04-03 ENCOUNTER — Telehealth (HOSPITAL_COMMUNITY): Payer: Self-pay | Admitting: *Deleted

## 2016-04-03 NOTE — Telephone Encounter (Signed)
  Called patient this morning and she is aware of Dr. Clayborne Dana advise regarding Cogentin. No further questions at this time.   Progress notes   Kennieth Rad, RN at 03/31/2016 9:51 AM   Status: Signed    Patient called saying her psychiatrist had prescribed her Cogentin 1 mg from tremors and she wanted to check with Dr. [name] if this was ok for her to take with her heart failure and other medications.  She also asked if we could schedule her next follow up appointment that was due this month.  I have scheduled her a 3 month f/u appointment and she is aware that I will call her back with Dr. Clayborne Dana response regarding her medication.  No further questions at this time.     Jolaine Artist, MD at 04/01/2016 3:25 PM   Status: Signed    Ok by me.

## 2016-04-08 ENCOUNTER — Other Ambulatory Visit (HOSPITAL_COMMUNITY): Payer: Self-pay | Admitting: Internal Medicine

## 2016-04-18 ENCOUNTER — Ambulatory Visit (HOSPITAL_COMMUNITY)
Admission: RE | Admit: 2016-04-18 | Discharge: 2016-04-18 | Disposition: A | Discharge status: Home / Self Care | Payer: BC Managed Care – PPO | Source: Ambulatory Visit | Attending: Internal Medicine | Admitting: Internal Medicine

## 2016-04-18 VITALS — BP 122/84 | HR 76 | Wt 337.2 lb

## 2016-04-18 DIAGNOSIS — Z6841 Body Mass Index (BMI) 40.0 and over, adult: Secondary | ICD-10-CM | POA: Insufficient documentation

## 2016-04-18 DIAGNOSIS — R0683 Snoring: Secondary | ICD-10-CM | POA: Insufficient documentation

## 2016-04-18 DIAGNOSIS — I10 Essential (primary) hypertension: Secondary | ICD-10-CM

## 2016-04-18 DIAGNOSIS — E213 Hyperparathyroidism, unspecified: Secondary | ICD-10-CM | POA: Insufficient documentation

## 2016-04-18 DIAGNOSIS — I110 Hypertensive heart disease with heart failure: Secondary | ICD-10-CM | POA: Diagnosis not present

## 2016-04-18 DIAGNOSIS — Z85828 Personal history of other malignant neoplasm of skin: Secondary | ICD-10-CM | POA: Diagnosis not present

## 2016-04-18 DIAGNOSIS — E785 Hyperlipidemia, unspecified: Secondary | ICD-10-CM | POA: Diagnosis not present

## 2016-04-18 DIAGNOSIS — J45909 Unspecified asthma, uncomplicated: Secondary | ICD-10-CM | POA: Insufficient documentation

## 2016-04-18 DIAGNOSIS — Z7902 Long term (current) use of antithrombotics/antiplatelets: Secondary | ICD-10-CM | POA: Insufficient documentation

## 2016-04-18 DIAGNOSIS — F419 Anxiety disorder, unspecified: Secondary | ICD-10-CM | POA: Diagnosis not present

## 2016-04-18 DIAGNOSIS — I480 Paroxysmal atrial fibrillation: Secondary | ICD-10-CM | POA: Insufficient documentation

## 2016-04-18 DIAGNOSIS — I2510 Atherosclerotic heart disease of native coronary artery without angina pectoris: Secondary | ICD-10-CM | POA: Insufficient documentation

## 2016-04-18 DIAGNOSIS — Z7982 Long term (current) use of aspirin: Secondary | ICD-10-CM | POA: Diagnosis not present

## 2016-04-18 DIAGNOSIS — I5032 Chronic diastolic (congestive) heart failure: Secondary | ICD-10-CM | POA: Diagnosis not present

## 2016-04-18 DIAGNOSIS — Z7901 Long term (current) use of anticoagulants: Secondary | ICD-10-CM | POA: Diagnosis not present

## 2016-04-18 DIAGNOSIS — Z86718 Personal history of other venous thrombosis and embolism: Secondary | ICD-10-CM | POA: Insufficient documentation

## 2016-04-18 DIAGNOSIS — E119 Type 2 diabetes mellitus without complications: Secondary | ICD-10-CM | POA: Insufficient documentation

## 2016-04-18 DIAGNOSIS — I252 Old myocardial infarction: Secondary | ICD-10-CM | POA: Diagnosis not present

## 2016-04-18 DIAGNOSIS — Z8249 Family history of ischemic heart disease and other diseases of the circulatory system: Secondary | ICD-10-CM | POA: Diagnosis not present

## 2016-04-18 DIAGNOSIS — F329 Major depressive disorder, single episode, unspecified: Secondary | ICD-10-CM | POA: Insufficient documentation

## 2016-04-18 NOTE — Progress Notes (Signed)
Advanced Heart Failure Medication Review by a Pharmacist  Does the patient  feel that his/her medications are working for him/her?  yes  Has the patient been experiencing any side effects to the medications prescribed?  no  Does the patient measure his/her own blood pressure or blood glucose at home?  yes   Does the patient have any problems obtaining medications due to transportation or finances?   no  Understanding of regimen: good Understanding of indications: good Potential of compliance: fair Patient understands to avoid NSAIDs. Patient understands to avoid decongestants.  Issues to address at subsequent visits: None  Pharmacist comments: Kathleen [name] is a pleasant 61 yo Caucasian female presenting to [facility] with her medications. There were no medication discrepancies, however she admits that she hasn't been taking Klor-Con for the past year (tablets are too large, community pharmacist told her she can't split the tablets). No other questions or concerns.    Kathleen [name], PharmD PGY1 Pharmacy Resident [phone] (Pager) 04/18/2016 10:26 AM  Time with patient: 10 min Preparation and documentation time: 2 min Total time: 12 min

## 2016-04-18 NOTE — Patient Instructions (Signed)
STOP Potassium.  Kevan Rosebush RN will call you to update you on process of CardioMEMS device workup.  Follow up 4 months with [name] Clegg NP-C.  Do the following things EVERYDAY: 1) Weigh yourself in the morning before breakfast. Write it down and keep it in a log. 2) Take your medicines as prescribed 3) Eat low salt foods-Limit salt (sodium) to 2000 mg per day.  4) Stay as active as you can everyday 5) Limit all fluids for the day to less than 2 liters

## 2016-04-18 NOTE — Progress Notes (Signed)
Patient ID: Kathleen [name], female   DOB: October 31, 1955, 61 y.o.   MRN: [id number]    Advanced Heart Failure Clinic Note   Primary HF MD: Dr Kathleen [name] PCP: Dr Kathleen [name].   HPI 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 Sleep study 11/2013 was negative. Also has history of A fib.    Had NSTEMI in 2/09 had cath at that time with occlusion of septal perforator. Otherwise normal arteries. Was admitted for CP in 11/13. CE normal. Underwent dobutamine stress echo which was normal.   Admitted 09/11/14 for Afib RVR. She converted to sinus tach with dilt drip and was transitioned to Toprol-XL 25 mg daily. She was in NSR at the time of discharge. Discharge weight was 325 lb.  Admitted 7/12-7/14/17 with chest pain. CTA negative for PE. Troponins negative.  Had Myoview 08/05/15 with "subtle" anterior wall defect. Reviewed by Dr. Haroldine [name] at visit 08/25/15, thought to possibly be breast attenuation. Echo stable as below.   Admitted 12/6-12/82017 with CP and dyspnea. Cardiac work up was negative. Discharge weight was 334 pounds.   Today she returns for HF follow up. Since the last visit she has retired. Overall feeling ok. Remains SOB with exertion. Denies PND/Orthopnea. Tries to walk 3 times a wee but requires rest breaks. Weight at home 335-337 pounds. Not following low diet. Taking all medications except potassium. Lives alone.   Echo 11/13 EF 60-65% Mild LAE. RV normal. No PH. No comment on diastolic function.  Echo 09/04/13 EF 55-60%  Grade I DD Echo 08/05/15 EF 55-60%  Grade I DD  Labs:  09/25/13 K 4.1 Creatinine 1.0 , LDL 79 10/21/2014: K 5.4 Creatinine 0.95  02/22/2015: K 4.4 Creatinine 1.08  03/30/2016: K 4.3 Creatinine 1.09   Allergies  Allergen Reactions  . Lamictal [Lamotrigine]     Rash   . Eggs Or Egg-Derived Products Hives    Can eat foods with cooked eggs; CANNOT handle when part of flu vaccine, etc.      Current Outpatient Prescriptions  Medication Sig Dispense Refill  . albuterol (PROVENTIL HFA;VENTOLIN HFA) 108 (90 BASE) MCG/ACT inhaler Inhale 2 puffs into the lungs every 6 (six) hours as needed for wheezing. 1 Inhaler 1  . ALPRAZolam (XANAX) 0.5 MG tablet Take 0.5 mg by mouth 2 (two) times daily as needed for anxiety.    . benztropine (COGENTIN) 1 MG tablet Take 1 mg by mouth 2 (two) times daily.  2  . divalproex (DEPAKOTE ER) 500 MG 24 hr tablet Take 1,500 mg by mouth at bedtime.     . furosemide (LASIX) 80 MG tablet Take 1 tablet (80 mg total) by mouth daily. Take an extra 50 mg (1/2 tablet) once daily AS NEEDED if weight 339 lbs or more. 45 tablet 5  . LATUDA 60 MG TABS Take 1 tablet by mouth daily with supper.     [name] lisinopril (PRINIVIL,ZESTRIL) 10 MG tablet TAKE 1 TABLET (10 MG TOTAL) BY MOUTH DAILY. 90 tablet 3  . metoprolol succinate (TOPROL-XL) 50 MG 24 hr tablet TAKE 1 TABLET BY MOUTH DAILY WITH OR IMMEDIATELY FOLLOWING A MEAL 90 tablet 3  . OLANZapine (ZYPREXA) 5 MG tablet Take 5 mg by mouth at bedtime.    . simvastatin (ZOCOR) 40 MG tablet Take 1 tablet (40 mg total) by mouth at bedtime. 30 tablet 5  . triamcinolone ointment (KENALOG) 0.1 % APPLY TO AFFECTED  AREA TWICE A DAY FOR 14 DAYS  1  . XARELTO 20 MG TABS tablet TAKE 1 TABLET BY MOUTH EVERY DAY WITH SUPPER 30 tablet 3  . nitroGLYCERIN (NITROSTAT) 0.4 MG SL tablet Place 1 tablet (0.4 mg total) under the tongue every 5 (five) minutes as needed for chest pain. For chest pain (Patient not taking: Reported on 03/30/2016) 25 tablet 3   No current facility-administered medications for this encounter.     Past Medical History:  Diagnosis Date  . Anemia   . Anxiety   . Asthma   . Atrial fibrillation ([place])   . Bronchitis    hx of   . Cancer ([place])    basal cell carcinoma on right hand   . CHF (congestive heart failure) ([place])    pt seen at heart/vascular spec clinic 08/14/2014   . Coronary artery disease   . Depression   .  Dyslipidemia   . Fall   . Heart attack 02/2007   non-q-wave with second septal perforator  . Hyperlipidemia   . Hypertension   . Knee pain, left   . Lower extremity deep venous thrombosis ([facility])    20 years ago   . Measles    hx of in childhood   . Morbid obesity with BMI of 40.0-44.9, adult ([place])   . Pneumonia    hx of   . PONV (postoperative nausea and vomiting)   . Psychiatric hospitalization 05/2008  . Shingles    20 years ago   . Shortness of breath dyspnea    exercise   . Type 2 diabetes mellitus ([place]) 12/31/2015  . Urinary incontinence   . Urinary tract bacterial infections     Past Surgical History:  Procedure Laterality Date  . BREAST MASS EXCISION     age 61, benign tumor  . CARDIAC CATHETERIZATION    . CARDIAC CATHETERIZATION N/A 12/30/2015   Procedure: Left Heart Cath and Coronary Angiography;  Surgeon: Belva Crome, MD;  Location: [facility];  Service: Cardiovascular;  Laterality: N/A;  . DILATION AND CURETTAGE OF UTERUS    . KNEE SURGERY     right, removed cartilage  . PARATHYROIDECTOMY N/A 08/25/2014   Procedure: PARATHYROIDECTOMY;  Surgeon: Armandina Gemma, MD;  Location: [facility];  Service: General;  Laterality: N/A;  . TONSILLECTOMY    . TUBAL LIGATION      ROS:  As stated in the HPI and negative for all other systems.  PHYSICAL EXAM BP 122/84 (BP Location: Right Arm, Patient Position: Sitting, Cuff Size: Large)   Pulse 76   Wt (!) 337 lb 3.2 oz (153 kg)   SpO2 96%   BMI 45.73 kg/m    Wt Readings from Last 3 Encounters:  04/18/16 (!) 337 lb 3.2 oz (153 kg)  03/30/16 (!) 336 lb 2 oz (152.5 kg)  02/22/16 (!) 337 lb 14.4 oz (153.3 kg)    General: NAD. Walked in the clinic.  Psych: Normal affect. HEENT: Normal, without mass or lesion. Neck: Supple.JVP 5-6. Carotids 2+. No lymphadenopathy/thyromegaly appreciated. Heart: PMI nondisplaced. RRR no s3, s4, or murmurs appreciated Lungs: CTAB. Abdomen: Soft, non-tender, non-distended, No HSM,  BS + x 4.  Extremities: No clubbing, cyanosis. R and LLE trace edema.  Neuro: Alert and oriented X 3. Moves all extremities spontaneously.  ASSESSMENT AND PLAN 1. Chronic diastolic HF:  ECHO 01/2749 EF Normal EF 55-60% Grade IDD  NYHA III. Volume status stable. Continue lasix 80 mg dailyl. Discussed Cardiomems and provided information.  She is interested. We will start the approval process.   Do the following things EVERYDAY: 1) Weigh yourself in the morning before breakfast. Write it down and keep it in a log. 2) Take your medicines as prescribed 3) Eat low salt foods-Limit salt (sodium) to 2000 mg per day.  4) Stay as active as you can everyday 5) Limit all fluids for the day to less than 2 liters 2. PAF. 1st noted during hospital admit 8/16. CHADSVASC = 3. - Regular pulse.  Continue current bb and xarelto dose. No bleeding problems  3. Morbid obesity: Discussed portion control. Saw dietitian.  4. CAD:  - Continue ASA 81, Plavix, statin.   -12//2017 CP work up negative.   - No CP 5. HTN:   - Stable. Continue current regimen.  6. Snoring:  - No OSA on sleep study.  7. Hyperparathyroidism-   - s/p partial parathyroidectomy 08/2014. Per PCP.   Follow up in 3-4 months. Possible Cardiomems if approved.   Jorgeluis Gurganus NP-C  04/18/2016

## 2016-05-11 ENCOUNTER — Telehealth (HOSPITAL_COMMUNITY): Payer: Self-pay | Admitting: *Deleted

## 2016-05-11 NOTE — Telephone Encounter (Signed)
Pt called to report increased SOB and palpitations.  She states she has not taken her Lasix for past 2 days b/c she "was busy and forgot them"  However she does state she has taken her Xarelto and Metoprolol.  She denies dizziness, lightheadedness, and CP.  Pt w/history of a-fib, she states she is unsure if she is back in a-fib or not, she states her friend checked her pulse and told her it is not fast but is irregular.  Discussed all above w/[name] Ninfa Meeker, NP she recommends pt take Lasix now and come for EKG in AM, if feels worse can report to ER.  Pt aware and agreeable to plan, she will come for EKG at 8 am.

## 2016-05-12 ENCOUNTER — Ambulatory Visit (HOSPITAL_COMMUNITY)
Admission: RE | Admit: 2016-05-12 | Discharge: 2016-05-12 | Disposition: A | Discharge status: Home / Self Care | Payer: BC Managed Care – PPO | Source: Ambulatory Visit | Attending: Cardiology | Admitting: Cardiology

## 2016-05-12 VITALS — BP 138/70 | HR 88 | Wt 336.5 lb

## 2016-05-12 DIAGNOSIS — I480 Paroxysmal atrial fibrillation: Secondary | ICD-10-CM | POA: Diagnosis not present

## 2016-05-12 NOTE — Patient Instructions (Addendum)
You are in normal rhythm today  Please make sure to take all your medications.   Please keep follow up appointment in July as scheduled, please call our office for any issues or concerns before then.

## 2016-05-12 NOTE — Progress Notes (Signed)
Pt in for her EKG today.  She states she did take her meds, including Lasix yesterday evening and reports she was up all night going to the bathroom.  She states she is feeling a little better today and does not feel like her heart is "flip flopping" like it was yesterday.  EKG showed NSR, wt at last OV was 337, today pt is at 336, discussed w/[name] Clegg, NP she recommends pt taking meds, f/u as sch call back before if needed.  Pt is aware and agreeable, if palps not better by Monday she will call us back.

## 2016-05-25 ENCOUNTER — Encounter: Payer: Self-pay | Admitting: Family Medicine

## 2016-06-12 ENCOUNTER — Other Ambulatory Visit (HOSPITAL_COMMUNITY): Payer: Self-pay | Admitting: Student

## 2016-06-20 ENCOUNTER — Other Ambulatory Visit: Payer: Self-pay | Admitting: Obstetrics and Gynecology

## 2016-06-20 DIAGNOSIS — Z803 Family history of malignant neoplasm of breast: Secondary | ICD-10-CM

## 2016-06-23 ENCOUNTER — Encounter: Payer: Self-pay | Admitting: Family Medicine

## 2016-06-29 ENCOUNTER — Ambulatory Visit (INDEPENDENT_AMBULATORY_CARE_PROVIDER_SITE_OTHER): Payer: BC Managed Care – PPO | Admitting: Family Medicine

## 2016-06-29 ENCOUNTER — Encounter: Payer: Self-pay | Admitting: Family Medicine

## 2016-06-29 DIAGNOSIS — M109 Gout, unspecified: Secondary | ICD-10-CM | POA: Diagnosis not present

## 2016-06-29 LAB — BASIC METABOLIC PANEL
BUN: 27 mg/dL — ABNORMAL HIGH (ref 6–23)
CALCIUM: 9.6 mg/dL (ref 8.4–10.5)
CO2: 26 meq/L (ref 19–32)
CREATININE: 0.97 mg/dL (ref 0.40–1.20)
Chloride: 100 mEq/L (ref 96–112)
GFR: 62.1 mL/min (ref >60.00)
GLUCOSE: 119 mg/dL — AB (ref 70–99)
Potassium: 4.1 mEq/L (ref 3.5–5.1)
Sodium: 141 mEq/L (ref 135–145)

## 2016-06-29 MED ORDER — PREDNISONE 10 MG PO TABS: ORAL_TABLET | ORAL | 0 refills | Status: DC

## 2016-06-29 MED ORDER — ALLOPURINOL 100 MG PO TABS: 100.0000 mg | ORAL_TABLET | Freq: Every day | ORAL | 6 refills | Status: DC

## 2016-06-29 MED ORDER — TRIAMCINOLONE ACETONIDE 0.1 % EX OINT: TOPICAL_OINTMENT | CUTANEOUS | 1 refills | Status: DC

## 2016-06-29 NOTE — Progress Notes (Signed)
   Subjective:    Patient ID: Kathleen [name], female    DOB: 02/01/1955, 61 y.o.   MRN: [id number]  HPI Gout- pt was seen 5/28 at [facility] and dx'd w/ gout of L big toe.  Uric acid level was 9.  Pt completed prednisone but then this AM developed pain in R great toe.  No change in diet recently.  Having mild redness, some swelling.   Review of Systems For ROS see HPI     Objective:   Physical Exam  Constitutional: She is oriented to person, place, and time. She appears well-developed and well-nourished. No distress.  Morbidly obese  Cardiovascular: Intact distal pulses.   Musculoskeletal: She exhibits edema (mild swelling over R 1st MTP joint) and tenderness (TTP over R 1st MTP joint).  Neurological: She is alert and oriented to person, place, and time.  Skin: Skin is warm and dry. There is erythema (mild erythema over R great toe).  Psychiatric: She has a normal mood and affect. Her behavior is normal. Thought content normal.  Vitals reviewed.         Assessment & Plan:

## 2016-06-29 NOTE — Patient Instructions (Signed)
Follow up as scheduled We'll notify you of your lab results and make any changes if needed Start the Prednisone as directed- take w/ food Start the once daily Allopurinol to control the gout Review the gout diet Call with any questions or concerns Hang in there!!!

## 2016-06-29 NOTE — Progress Notes (Signed)
Pre visit review using our clinic review tool, if applicable. No additional management support is needed unless otherwise documented below in the visit note. 

## 2016-06-30 NOTE — Assessment & Plan Note (Signed)
New.  This is pt's first episode.  Reviewed notes and labs from UC.  Given that uric acid is 9, will start Allopurinol daily.  Provided handouts on low purine diet.  Since pt is having flare in R great toe, will start Pred taper while initiating allopurinol.  Reviewed supportive care and red flags that should prompt return.  Pt expressed understanding and is in agreement w/ plan.

## 2016-07-04 ENCOUNTER — Ambulatory Visit
Admission: RE | Admit: 2016-07-04 | Discharge: 2016-07-04 | Disposition: A | Discharge status: Home / Self Care | Payer: BC Managed Care – PPO | Source: Ambulatory Visit | Attending: Obstetrics and Gynecology | Admitting: Obstetrics and Gynecology

## 2016-07-04 DIAGNOSIS — Z803 Family history of malignant neoplasm of breast: Secondary | ICD-10-CM

## 2016-07-04 MED ORDER — GADOBENATE DIMEGLUMINE 529 MG/ML IV SOLN
20.0000 mL | Freq: Once | INTRAVENOUS | Status: AC | PRN
  Administered 2016-07-04: 20 mL via INTRAVENOUS

## 2016-07-05 ENCOUNTER — Other Ambulatory Visit: Payer: Self-pay | Admitting: Obstetrics and Gynecology

## 2016-07-05 DIAGNOSIS — R928 Other abnormal and inconclusive findings on diagnostic imaging of breast: Secondary | ICD-10-CM

## 2016-07-12 ENCOUNTER — Encounter: Payer: Self-pay | Admitting: Family Medicine

## 2016-07-12 ENCOUNTER — Other Ambulatory Visit: Payer: Self-pay | Admitting: Cardiology

## 2016-07-13 ENCOUNTER — Telehealth: Payer: Self-pay | Admitting: Family Medicine

## 2016-07-13 NOTE — Telephone Encounter (Signed)
If gout is flaring, a uric acid level is only part of the equation.  She would still need an OV to evaluate and treat her sxs

## 2016-07-13 NOTE — Telephone Encounter (Signed)
Pt has been scheduled.  °

## 2016-07-13 NOTE — Telephone Encounter (Signed)
Can you schedule pt an OV? She would need this not just labs per PCP

## 2016-07-13 NOTE — Telephone Encounter (Signed)
Please advise? This is the pt who wrote earlier about her gout flaring up again and she did not want to be on prednisone. [place] for labs only or would you prefer an OV?

## 2016-07-13 NOTE — Telephone Encounter (Signed)
Pt states that she needs to come in to have her uric acid level checked and would like to go to [facility] to have this done, can an order be placed so pt can be scheduled at that location.

## 2016-07-14 ENCOUNTER — Ambulatory Visit
Admission: RE | Admit: 2016-07-14 | Discharge: 2016-07-14 | Disposition: A | Discharge status: Home / Self Care | Payer: BC Managed Care – PPO | Source: Ambulatory Visit | Attending: Obstetrics and Gynecology | Admitting: Obstetrics and Gynecology

## 2016-07-14 ENCOUNTER — Encounter: Payer: Self-pay | Admitting: Family Medicine

## 2016-07-14 ENCOUNTER — Ambulatory Visit (INDEPENDENT_AMBULATORY_CARE_PROVIDER_SITE_OTHER): Payer: BC Managed Care – PPO | Admitting: Family Medicine

## 2016-07-14 ENCOUNTER — Telehealth (HOSPITAL_COMMUNITY): Payer: Self-pay | Admitting: *Deleted

## 2016-07-14 VITALS — BP 130/90 | HR 83 | Temp 98.7°F | Resp 16 | Ht 72.0 in | Wt 341.0 lb

## 2016-07-14 DIAGNOSIS — M109 Gout, unspecified: Secondary | ICD-10-CM | POA: Diagnosis not present

## 2016-07-14 DIAGNOSIS — R928 Other abnormal and inconclusive findings on diagnostic imaging of breast: Secondary | ICD-10-CM

## 2016-07-14 MED ORDER — GADOBENATE DIMEGLUMINE 529 MG/ML IV SOLN
20.0000 mL | Freq: Once | INTRAVENOUS | Status: AC | PRN
  Administered 2016-07-14: 20 mL via INTRAVENOUS

## 2016-07-14 MED ORDER — PREDNISONE 10 MG PO TABS: ORAL_TABLET | ORAL | 0 refills | Status: DC

## 2016-07-14 NOTE — Telephone Encounter (Signed)
Pt called stating she is having trouble w/gout, she recently finished a prednisone course but has another flare up and was restarted on prednisone today.  She also states they told her to drink plenty of water and she is concerned about doing this as her wt is already up 5 lbs this week.  Advised pt about salt and fluid intake, advised her to go ahead and take her extra Lasix today (40 mg).  She is agreeable and will call us back Mon if not better

## 2016-07-14 NOTE — Assessment & Plan Note (Signed)
Deteriorated.  Pt is having a gout flare on the L (previously on R).  1st MTP is again red, warm, swollen, and very TTP.  Check uric acid level since she was started on Allopurinol earlier this month.  Start Prednisone taper as she is on Xarelto and can't tolerate NSAIDs.  Will likely need to increase Allopurinol.  Will follow closely.  Reviewed supportive care and red flags that should prompt return.  Pt expressed understanding and is in agreement w/ plan.

## 2016-07-14 NOTE — Patient Instructions (Signed)
Follow up as scheduled/needed We'll notify you of your lab results and make any changes if needed Start the Prednisone as directed- take w/ food We'll adjust the Allopurinol based on labs if needed Drink plenty of fluids Continue to follow the low purine diet ICE! ELEVATE! Call with any questions or concerns Hang in there!!

## 2016-07-14 NOTE — Progress Notes (Signed)
   Subjective:    Patient ID: Kathleen [name], female    DOB: 23-Aug-1955, 61 y.o.   MRN: [id number]  HPI Gout- pt was seen 6/7 and started on Allopurinol and tx'd for R great toe gout w/ Pred taper.  Starting Monday, pt developed pain, redness, and swelling of L great toe.  Painful to have sheet touching foot at night.  Prior to starting Allopurinol, Uric Acid level was 9.  Pt had [employer] [employer] on Saturday but this was only change in diet.  Pt reports adequate water intake.  She has gained 7 lbs since 6/2.   Review of Systems For ROS see HPI     Objective:   Physical Exam  Constitutional: She is oriented to person, place, and time. She appears well-developed and well-nourished. No distress.  HENT:  Head: Normocephalic and atraumatic.  Cardiovascular: Intact distal pulses.   Musculoskeletal: She exhibits edema (swelling and TTP over L 1st MTP joint) and tenderness.  Neurological: She is alert and oriented to person, place, and time.  Skin: Skin is warm and dry. There is erythema (redness and warmth over L 1st MTP joint- very TTP).  Psychiatric: She has a normal mood and affect. Her behavior is normal. Thought content normal.  Vitals reviewed.         Assessment & Plan:

## 2016-07-14 NOTE — Progress Notes (Signed)
Pre visit review using our clinic review tool, if applicable. No additional management support is needed unless otherwise documented below in the visit note. 

## 2016-07-15 LAB — URIC ACID: URIC ACID, SERUM: 6.8 mg/dL (ref 2.5–7.0)

## 2016-07-17 ENCOUNTER — Other Ambulatory Visit: Payer: Self-pay | Admitting: General Practice

## 2016-07-17 MED ORDER — ALLOPURINOL 100 MG PO TABS: 200.0000 mg | ORAL_TABLET | Freq: Every day | ORAL | 6 refills | Status: DC

## 2016-08-03 ENCOUNTER — Encounter: Payer: Self-pay | Admitting: Family Medicine

## 2016-08-03 ENCOUNTER — Ambulatory Visit (INDEPENDENT_AMBULATORY_CARE_PROVIDER_SITE_OTHER): Payer: BC Managed Care – PPO | Admitting: Family Medicine

## 2016-08-03 VITALS — BP 128/82 | HR 69 | Temp 98.1°F | Resp 16 | Ht 72.0 in | Wt 341.0 lb

## 2016-08-03 DIAGNOSIS — E119 Type 2 diabetes mellitus without complications: Secondary | ICD-10-CM

## 2016-08-03 DIAGNOSIS — Z0000 Encounter for general adult medical examination without abnormal findings: Secondary | ICD-10-CM

## 2016-08-03 DIAGNOSIS — E210 Primary hyperparathyroidism: Secondary | ICD-10-CM | POA: Diagnosis not present

## 2016-08-03 LAB — BASIC METABOLIC PANEL
BUN: 21 mg/dL (ref 6–23)
CO2: 31 mEq/L (ref 19–32)
CREATININE: 1.06 mg/dL (ref 0.40–1.20)
Calcium: 9.2 mg/dL (ref 8.4–10.5)
Chloride: 100 mEq/L (ref 96–112)
GFR: 56.04 mL/min — AB (ref >60.00)
Glucose, Bld: 186 mg/dL — ABNORMAL HIGH (ref 70–99)
Potassium: 4.3 mEq/L (ref 3.5–5.1)
Sodium: 142 mEq/L (ref 135–145)

## 2016-08-03 LAB — LIPID PANEL
CHOL/HDL RATIO: 5
CHOLESTEROL: 233 mg/dL — AB (ref 0–200)
HDL: 46.2 mg/dL (ref >39.00)
NonHDL: 187.17
TRIGLYCERIDES: 201 mg/dL — AB (ref 0.0–149.0)
VLDL: 40.2 mg/dL — AB (ref 0.0–40.0)

## 2016-08-03 LAB — CBC WITH DIFFERENTIAL/PLATELET
BASOS ABS: 0 10*3/uL (ref 0.0–0.1)
Basophils Relative: 0.7 % (ref 0.0–3.0)
Eosinophils Absolute: 0.1 10*3/uL (ref 0.0–0.7)
Eosinophils Relative: 2.1 % (ref 0.0–5.0)
HEMATOCRIT: 43.5 % (ref 36.0–46.0)
Hemoglobin: 14.7 g/dL (ref 12.0–15.0)
LYMPHS PCT: 21.4 % (ref 12.0–46.0)
Lymphs Abs: 1.1 10*3/uL (ref 0.7–4.0)
MCHC: 33.8 g/dL (ref 30.0–36.0)
MCV: 87.7 fl (ref 78.0–100.0)
MONOS PCT: 8.8 % (ref 3.0–12.0)
Monocytes Absolute: 0.4 10*3/uL (ref 0.1–1.0)
NEUTROS PCT: 67 % (ref 43.0–77.0)
Neutro Abs: 3.3 10*3/uL (ref 1.4–7.7)
Platelets: 139 10*3/uL — ABNORMAL LOW (ref 150.0–400.0)
RBC: 4.96 Mil/uL (ref 3.87–5.11)
RDW: 15 % (ref 11.5–15.5)
WBC: 4.9 10*3/uL (ref 4.0–10.5)

## 2016-08-03 LAB — VITAMIN D 25 HYDROXY (VIT D DEFICIENCY, FRACTURES): VITD: 18.48 ng/mL — AB (ref 30.00–100.00)

## 2016-08-03 LAB — TSH: TSH: 4.24 u[IU]/mL (ref 0.35–4.50)

## 2016-08-03 LAB — HEPATIC FUNCTION PANEL
ALBUMIN: 4.1 g/dL (ref 3.5–5.2)
ALT: 16 U/L (ref 0–35)
AST: 12 U/L (ref 0–37)
Alkaline Phosphatase: 81 U/L (ref 39–117)
Bilirubin, Direct: 0.1 mg/dL (ref 0.0–0.3)
Total Bilirubin: 0.5 mg/dL (ref 0.2–1.2)
Total Protein: 6.3 g/dL (ref 6.0–8.3)

## 2016-08-03 LAB — HEMOGLOBIN A1C: Hgb A1c MFr Bld: 8.2 % — ABNORMAL HIGH (ref 4.6–6.5)

## 2016-08-03 LAB — LDL CHOLESTEROL, DIRECT: Direct LDL: 166 mg/dL

## 2016-08-03 NOTE — Patient Instructions (Signed)
Follow up in 3-4 months to recheck diabetes We'll notify you of your lab results and make any changes if needed Continue to work on healthy diet and regular exercise- you can do it! Keep me updated on the GYN issues Call with any questions or concerns Have a great summer!!!

## 2016-08-03 NOTE — Assessment & Plan Note (Signed)
Chronic problem.  Stressed need for healthy diet and regular exercise.  Check labs to risk stratify.  Will follow. 

## 2016-08-03 NOTE — Progress Notes (Signed)
   Subjective:    Patient ID: Kathleen [name], female    DOB: 24-Dec-1955, 61 y.o.   MRN: [id number]  HPI CPE- UTD on colonoscopy, mammo, pap, immunizations.  UTD on eye exam (due end of month), foot exam.  On ACE for renal protection.   Review of Systems Patient reports no vision/ hearing changes, adenopathy,fever, weight change,  persistant/recurrent hoarseness , swallowing issues, chest pain, palpitations, edema, persistant/recurrent cough, hemoptysis, dyspnea (rest/exertional/paroxysmal nocturnal), gastrointestinal bleeding (melena, rectal bleeding), abdominal pain, significant heartburn, bowel changes, GU symptoms (dysuria, hematuria, incontinence),  syncope, focal weakness, memory loss, numbness & tingling, skin/hair/nail changes, abnormal bruising, anxiety, or depression.   + vaginal bleeding- x1 week, saw GYN, sonohystogram pending    Objective:   Physical Exam General Appearance:    Alert, cooperative, no distress, appears stated age, morbidly obese  Head:    Normocephalic, without obvious abnormality, atraumatic  Eyes:    PERRL, conjunctiva/corneas clear, EOM's intact, fundi    benign, both eyes  Ears:    Normal TM's and external ear canals, both ears  Nose:   Nares normal, septum midline, mucosa normal, no drainage    or sinus tenderness  Throat:   Lips, mucosa, and tongue normal; teeth and gums normal  Neck:   Supple, symmetrical, trachea midline, no adenopathy;    Thyroid: no enlargement/tenderness/nodules  Back:     Symmetric, no curvature, ROM normal, no CVA tenderness  Lungs:     Clear to auscultation bilaterally, respirations unlabored  Chest Wall:    No tenderness or deformity   Heart:    Regular rate and rhythm, S1 and S2 normal, no murmur, rub   or gallop  Breast Exam:    Deferred to GYN  Abdomen:     Soft, non-tender, bowel sounds active all four quadrants,    no masses, no organomegaly  Genitalia:    Deferred to GYN  Rectal:    Extremities:   Extremities normal,  atraumatic, no cyanosis, trace to 1+ edema of LEs bilaterally  Pulses:   2+ and symmetric all extremities  Skin:   Skin color, texture, turgor normal, no rashes or lesions  Lymph nodes:   Cervical, supraclavicular, and axillary nodes normal  Neurologic:   CNII-XII intact, normal strength, sensation and reflexes    throughout          Assessment & Plan:

## 2016-08-03 NOTE — Assessment & Plan Note (Signed)
Chronic problem.  Currently diet controlled.  Asymptomatic.  UTD on foot exam, eye exam (due the end of the month), on ACE for renal protection.  Stressed need for healthy diet and regular exercise.  Check labs.  Adjust meds prn

## 2016-08-03 NOTE — Assessment & Plan Note (Signed)
Chronic problem.  Check Ca, PTH, and Vit D.

## 2016-08-03 NOTE — Assessment & Plan Note (Signed)
Pt's PE unchanged from previous- morbid obesity, trace to 1+ LE edema, bilateral venous stasis changes.  UTD on pap, mammo, colonoscopy, immunizations.  Check labs.  Anticipatory guidance provided.

## 2016-08-03 NOTE — Progress Notes (Signed)
Pre visit review using our clinic review tool, if applicable. No additional management support is needed unless otherwise documented below in the visit note. 

## 2016-08-04 ENCOUNTER — Other Ambulatory Visit: Payer: Self-pay | Admitting: General Practice

## 2016-08-04 LAB — PARATHYROID HORMONE, INTACT (NO CA): PTH: 130 pg/mL — AB (ref 14–64)

## 2016-08-04 MED ORDER — ROSUVASTATIN CALCIUM 20 MG PO TABS: 20.0000 mg | ORAL_TABLET | Freq: Every day | ORAL | 6 refills | Status: DC

## 2016-08-04 MED ORDER — METFORMIN HCL 500 MG PO TABS
500.0000 mg | ORAL_TABLET | Freq: Two times a day (BID) | ORAL | 3 refills | Status: DC
Start: 2016-08-04 — End: 2016-11-25

## 2016-08-04 MED ORDER — VITAMIN D (ERGOCALCIFEROL) 1.25 MG (50000 UNIT) PO CAPS: 50000.0000 [IU] | ORAL_CAPSULE | ORAL | 0 refills | Status: DC

## 2016-08-09 NOTE — Progress Notes (Signed)
Patient ID: Kathleen [name], female   DOB: 17-Jul-1955, 61 y.o.   MRN: [id number]    Advanced [facility] Failure Clinic Note   Primary HF MD: Dr [name] PCP: Dr [name].   HPI 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 Sleep study 11/2013 was negative. Also has history of A fib.    Had NSTEMI in 2/09 had cath at that time with occlusion of septal perforator. Otherwise normal arteries. Was admitted for CP in 11/13. CE normal. Underwent dobutamine stress echo which was normal.   Admitted 09/11/14 for Afib RVR. She converted to sinus tach with dilt drip and was transitioned to Toprol-XL 25 mg daily. She was in NSR at the time of discharge. Discharge weight was 325 lb.  Admitted 7/12-7/14/17 with chest pain. CTA negative for PE. Troponins negative.  Had Myoview 08/05/15 with "subtle" anterior wall defect. Reviewed by Dr. [name] at visit 08/25/15, thought to possibly be breast attenuation. Echo stable as below.   Admitted 12/6-12/82017 with CP and dyspnea. Cardiac work up was negative. Discharge weight was 334 pounds.   Today she returns for 3 month follow up.  Overall feeling good. Denies SOB/PND/Orthopnea. Weight at home has been going down since she changed her diet. She has increased activity. [facility] [employer] and went 4 times this week.  Taking all medications.   Taking all medications.  Echo 11/13 EF 60-65% Mild LAE. RV normal. No PH. No comment on diastolic function.  Echo 09/04/13 EF 55-60%  Grade I DD Echo 08/05/15 EF 55-60%  Grade I DD  Labs:  09/25/13 K 4.1 Creatinine 1.0 , LDL 79 10/21/2014: K 5.4 Creatinine 0.95  02/22/2015: K 4.4 Creatinine 1.08  03/30/2016: K 4.3 Creatinine 1.09   Allergies  Allergen Reactions  . Lamictal [Lamotrigine]     Rash   . Eggs Or Egg-Derived Products Hives    Can eat foods with cooked eggs; CANNOT handle when part of flu vaccine, etc.     Current Outpatient Prescriptions    Medication Sig Dispense Refill  . albuterol (PROVENTIL HFA;VENTOLIN HFA) 108 (90 BASE) MCG/ACT inhaler Inhale 2 puffs into the lungs every 6 (six) hours as needed for wheezing. 1 Inhaler 1  . allopurinol (ZYLOPRIM) 100 MG tablet Take 2 tablets (200 mg total) by mouth daily. 60 tablet 6  . ALPRAZolam (XANAX) 0.5 MG tablet Take 0.5 mg by mouth 2 (two) times daily as needed for anxiety.    . benztropine (COGENTIN) 1 MG tablet Take 1 mg by mouth 2 (two) times daily.  2  . divalproex (DEPAKOTE ER) 500 MG 24 hr tablet Take 1,500 mg by mouth at bedtime.     . furosemide (LASIX) 80 MG tablet Take 1 tablet (80 mg total) by mouth daily. Take an extra 50 mg (1/2 tablet) once daily AS NEEDED if weight 339 lbs or more. 45 tablet 5  . LATUDA 60 MG TABS Take 1 tablet by mouth daily with supper.     [name] lisinopril (PRINIVIL,ZESTRIL) 10 MG tablet TAKE 1 TABLET (10 MG TOTAL) BY MOUTH DAILY. 90 tablet 3  . metFORMIN (GLUCOPHAGE) 500 MG tablet Take 1 tablet (500 mg total) by mouth 2 (two) times daily with a meal. 60 tablet 3  . metoprolol succinate (TOPROL-XL) 50 MG 24 hr tablet TAKE 1 TABLET BY MOUTH DAILY WITH OR IMMEDIATELY FOLLOWING A MEAL 90 tablet 3  . nitroGLYCERIN (NITROSTAT)  0.4 MG SL tablet Place 1 tablet (0.4 mg total) under the tongue every 5 (five) minutes as needed for chest pain. For chest pain 25 tablet 3  . OLANZapine (ZYPREXA) 5 MG tablet Take 5 mg by mouth at bedtime.    . rosuvastatin (CRESTOR) 20 MG tablet Take 1 tablet (20 mg total) by mouth daily. 30 tablet 6  . triamcinolone ointment (KENALOG) 0.1 % APPLY TO AFFECTED AREA TWICE A DAY FOR 14 DAYS 30 g 1  . Vitamin D, Ergocalciferol, (DRISDOL) 50000 units CAPS capsule Take 1 capsule (50,000 Units total) by mouth every 7 (seven) days. 12 capsule 0  . XARELTO 20 MG TABS tablet TAKE 1 TABLET BY MOUTH EVERY DAY WITH SUPPER 30 tablet 3   No current facility-administered medications for this encounter.     Past Medical History:  Diagnosis Date  .  Anemia   . Anxiety   . Asthma   . Atrial fibrillation ([place])   . Bronchitis    hx of   . Cancer ([place])    basal cell carcinoma on right hand   . CHF (congestive heart failure) ([place])    pt seen at heart/vascular spec clinic 08/14/2014   . Coronary artery disease   . Depression   . Dyslipidemia   . Fall   . Heart attack ([place]) 02/2007   non-q-wave with second septal perforator  . Hyperlipidemia   . Hypertension   . Knee pain, left   . Lower extremity deep venous thrombosis ([facility])    20 years ago   . Measles    hx of in childhood   . Morbid obesity with BMI of 40.0-44.9, adult ([place])   . Pneumonia    hx of   . PONV (postoperative nausea and vomiting)   . Psychiatric hospitalization 05/2008  . Shingles    20 years ago   . Shortness of breath dyspnea    exercise   . Type 2 diabetes mellitus ([place]) 12/31/2015  . Urinary incontinence   . Urinary tract bacterial infections     Past Surgical History:  Procedure Laterality Date  . BREAST MASS EXCISION     age 53, benign tumor  . CARDIAC CATHETERIZATION    . CARDIAC CATHETERIZATION N/A 12/30/2015   Procedure: Left Heart Cath and Coronary Angiography;  Surgeon: Belva Crome, MD;  Location: [facility];  Service: Cardiovascular;  Laterality: N/A;  . DILATION AND CURETTAGE OF UTERUS    . KNEE SURGERY     right, removed cartilage  . PARATHYROIDECTOMY N/A 08/25/2014   Procedure: PARATHYROIDECTOMY;  Surgeon: Armandina Gemma, MD;  Location: [facility];  Service: General;  Laterality: N/A;  . TONSILLECTOMY    . TUBAL LIGATION      ROS:  As stated in the HPI and negative for all other systems.  PHYSICAL EXAM BP 112/74   Pulse 75   Wt (!) 335 lb 6 oz (152.1 kg)   SpO2 98%   BMI 45.49 kg/m    Wt Readings from Last 3 Encounters:  08/10/16 (!) 335 lb 6 oz (152.1 kg)  08/03/16 (!) 341 lb (154.7 kg)  07/14/16 (!) 341 lb (154.7 kg)    General:  Well appearing. No resp difficulty HEENT: normal Neck: supple. no JVD. Carotids 2+ bilat; no  bruits. No lymphadenopathy or thryomegaly appreciated. Cor: PMI nondisplaced. Regular rate & rhythm. No rubs, gallops or murmurs. Lungs: clear Abdomen: soft, nontender, nondistended. No hepatosplenomegaly. No bruits or masses. Good bowel sounds. Extremities: no  cyanosis, clubbing, rash, edema. Chronic lower extremity hyperpigmentation Neuro: alert & orientedx3, cranial nerves grossly intact. moves all 4 extremities w/o difficulty. Affect pleasant .  ASSESSMENT AND PLAN 1. Chronic diastolic HF:  ECHO 08/2079 EF Normal EF 55-60% Grade IDD  Volume status stable. Continue current diuretic regimen.   2. PAF. 1st noted during hospital admit 8/16. CHADSVASC = 3. Regular pulse.  Continue current bb and xarelto dose. No bleeding problems  3. Morbid obesity: Discussed portion control. 4. CAD:  - Continue ASA 81, Plavix, statin.   No CP.  5. HTN:   Stable.  6. Snoring:  - No OSA on sleep study.  7. Hyperparathyroidism-   - s/p partial parathyroidectomy 08/2014. Per PCP.    Follow up in 1 year.  [name] Clegg NP-C  08/10/2016

## 2016-08-10 ENCOUNTER — Ambulatory Visit (HOSPITAL_COMMUNITY)
Admission: RE | Admit: 2016-08-10 | Discharge: 2016-08-10 | Disposition: A | Discharge status: Home / Self Care | Payer: BC Managed Care – PPO | Source: Ambulatory Visit | Attending: Cardiology | Admitting: Cardiology

## 2016-08-10 VITALS — BP 112/74 | HR 75 | Wt 335.4 lb

## 2016-08-10 DIAGNOSIS — I5032 Chronic diastolic (congestive) heart failure: Secondary | ICD-10-CM | POA: Diagnosis not present

## 2016-08-10 DIAGNOSIS — I110 Hypertensive heart disease with heart failure: Secondary | ICD-10-CM | POA: Insufficient documentation

## 2016-08-10 DIAGNOSIS — R0683 Snoring: Secondary | ICD-10-CM | POA: Diagnosis not present

## 2016-08-10 DIAGNOSIS — I2510 Atherosclerotic heart disease of native coronary artery without angina pectoris: Secondary | ICD-10-CM | POA: Insufficient documentation

## 2016-08-10 DIAGNOSIS — Z7902 Long term (current) use of antithrombotics/antiplatelets: Secondary | ICD-10-CM | POA: Insufficient documentation

## 2016-08-10 DIAGNOSIS — I480 Paroxysmal atrial fibrillation: Secondary | ICD-10-CM | POA: Insufficient documentation

## 2016-08-10 DIAGNOSIS — Z6841 Body Mass Index (BMI) 40.0 and over, adult: Secondary | ICD-10-CM | POA: Insufficient documentation

## 2016-08-10 DIAGNOSIS — Z8249 Family history of ischemic heart disease and other diseases of the circulatory system: Secondary | ICD-10-CM | POA: Diagnosis not present

## 2016-08-10 DIAGNOSIS — Z79899 Other long term (current) drug therapy: Secondary | ICD-10-CM | POA: Diagnosis not present

## 2016-08-10 DIAGNOSIS — E213 Hyperparathyroidism, unspecified: Secondary | ICD-10-CM | POA: Diagnosis not present

## 2016-08-10 DIAGNOSIS — J45909 Unspecified asthma, uncomplicated: Secondary | ICD-10-CM | POA: Insufficient documentation

## 2016-08-10 DIAGNOSIS — Z7984 Long term (current) use of oral hypoglycemic drugs: Secondary | ICD-10-CM | POA: Diagnosis not present

## 2016-08-10 DIAGNOSIS — Z7901 Long term (current) use of anticoagulants: Secondary | ICD-10-CM | POA: Diagnosis not present

## 2016-08-10 DIAGNOSIS — I10 Essential (primary) hypertension: Secondary | ICD-10-CM

## 2016-08-10 DIAGNOSIS — Z9889 Other specified postprocedural states: Secondary | ICD-10-CM | POA: Diagnosis not present

## 2016-08-10 DIAGNOSIS — F329 Major depressive disorder, single episode, unspecified: Secondary | ICD-10-CM | POA: Diagnosis not present

## 2016-08-10 DIAGNOSIS — E119 Type 2 diabetes mellitus without complications: Secondary | ICD-10-CM | POA: Diagnosis not present

## 2016-08-10 DIAGNOSIS — Z86718 Personal history of other venous thrombosis and embolism: Secondary | ICD-10-CM | POA: Insufficient documentation

## 2016-08-10 DIAGNOSIS — Z91012 Allergy to eggs: Secondary | ICD-10-CM | POA: Diagnosis not present

## 2016-08-10 DIAGNOSIS — E785 Hyperlipidemia, unspecified: Secondary | ICD-10-CM | POA: Diagnosis not present

## 2016-08-10 DIAGNOSIS — Z888 Allergy status to other drugs, medicaments and biological substances status: Secondary | ICD-10-CM | POA: Insufficient documentation

## 2016-08-10 DIAGNOSIS — I252 Old myocardial infarction: Secondary | ICD-10-CM | POA: Insufficient documentation

## 2016-08-10 DIAGNOSIS — Z85828 Personal history of other malignant neoplasm of skin: Secondary | ICD-10-CM | POA: Insufficient documentation

## 2016-08-10 NOTE — Patient Instructions (Signed)
We will contact you in 1 year to schedule your next appointment.  

## 2016-08-11 ENCOUNTER — Ambulatory Visit: Payer: Self-pay | Admitting: General Surgery

## 2016-08-11 DIAGNOSIS — D242 Benign neoplasm of left breast: Secondary | ICD-10-CM

## 2016-08-25 ENCOUNTER — Telehealth (HOSPITAL_COMMUNITY): Payer: Self-pay | Admitting: Cardiology

## 2016-08-25 NOTE — Telephone Encounter (Signed)
[facility] CALLED WITH A REQUEST FOR SURGICAL CLEARANCE. REPORTS PATIENT IS SCHEDULED 8/31 AND PATIENT IS ON XARELTO.   REQUEST IS PENDING REVIEW WITH DR [name]  DETAILED MESSAGE EXPLAIN DR BENSIMHON HAS BEEN ON VACATION AND WE WILL DO OUR BEST TO HAVE HIM REVIEW CORRESPONDENCE NEXT WEEK, FEEL FREE TO CALL BACK AND CHECK STATUS NEXT WEEK.

## 2016-08-29 LAB — HM DIABETES EYE EXAM

## 2016-08-29 NOTE — H&P (Signed)
Kathleen [name]  DICTATION # [id number] CSN# [id number]   Margarette Asal, MD 08/29/2016 9:44 AM

## 2016-08-31 ENCOUNTER — Other Ambulatory Visit: Payer: Self-pay | Admitting: General Surgery

## 2016-08-31 ENCOUNTER — Telehealth (HOSPITAL_COMMUNITY): Payer: Self-pay | Admitting: *Deleted

## 2016-08-31 DIAGNOSIS — D242 Benign neoplasm of left breast: Secondary | ICD-10-CM

## 2016-08-31 NOTE — H&P (Signed)
Kathleen [name], Kathleen [name]                 ACCOUNT NO.:  [id number]  MEDICAL RECORD NO.:  [id number]  LOCATION:                                 FACILITY:  PHYSICIAN:  Ralene Bathe. Matthew Saras, M.D.    DATE OF BIRTH:  DATE OF ADMISSION: DATE OF DISCHARGE:                             HISTORY & PHYSICAL   CHIEF COMPLAINT:  Postmenopausal bleeding.  HISTORY OF PRESENT ILLNESS:  A 61 year old, G3, P3, postmenopausal patient who underwent D and C, hysteroscopy for a large endometrial polyp back in 2010.  The pathology at that time showed a benign endometrial polyp.  She had no further bleeding until recently when she experienced some new onset postmenopausal bleeding.  Evaluation in our office by saline ultrasound on 07/18 demonstrated a small fibroid and adnexa unremarkable.  No free fluid, but there was a well-defined 16 mm polyp noted.  She presents now for D and C, hysteroscopy with MyoSure. This procedure including specific risks regarding bleeding, infection, adjacent organ injury, other complications may require additional surgery.  All discussed with her, which she understands and accepts.  ALLERGIES:  Lamictal.  CURRENT MEDICATIONS:  Simvastatin, Depakote, Latuda, metoprolol, levothyroxine, Zyprexa, lisinopril, Lasix, Xarelto.  REVIEW OF SYSTEMS:  Significant for AFib and adult onset diabetes.  FAMILY HISTORY:  Significant for history of headache, epilepsy, thyroid disease, osteoporosis, arthritis, diabetes, phlebitis, hypertension, breast, brain and lung cancer history.  PAST SURGICAL HISTORY:  She has had 3 vaginal deliveries, knee surgery, repair of an umbilical hernia, D and C, breast biopsy.  SOCIAL HISTORY:  She is divorced and denies alcohol, tobacco, or drug use.  Dr. [name] is her medical doctor.  PHYSICAL EXAMINATION:  VITAL SIGNS:  Temp 98.2, blood pressure 148/90. HEENT:  Unremarkable. NECK:  Supple without masses. LUNGS:  Clear. CARDIOVASCULAR:  Regular rate and  rhythm without murmurs, rubs, gallops noted. BREASTS:  Without masses. ABDOMEN:  Soft, flat, nontender. PELVIC:  Vulva, vagina, cervix normal.  Uterus mid position, normal sized.  Exam difficult due to her weight of 333.  Last Pap in December 2017 was normal.  IMPRESSION:  Postmenopausal bleeding, endometrial polyp.  PLAN:  D and C, hysteroscopy.  Procedure and risks discussed as above.     Lorea Kupfer M. Matthew Saras, M.D.   ______________________________ Ralene Bathe. Matthew Saras, M.D.    [facility]/MEDQ  D:  08/29/2016  T:  08/29/2016  Job:  [id number]

## 2016-08-31 NOTE — Telephone Encounter (Signed)
Received Surgical clearance form from [facility].  Dr. [name] says it is ok to hold anticoagulation as needed for right left breast lumpectomy procedure.

## 2016-09-01 ENCOUNTER — Telehealth (HOSPITAL_COMMUNITY): Payer: Self-pay

## 2016-09-01 NOTE — Telephone Encounter (Signed)
Received call from [facility] stating cardiac clearance was never received via fax. Clearance refaxed to both fax #s provided [phone] Attempted to call back to verify if office received fax but office phone line closed.  Renee Pain, RN

## 2016-09-06 NOTE — Pre-Procedure Instructions (Addendum)
Kathleen [name]  09/06/2016      [id number] - [place], [place] - [address]OSS [address] [place] [address] Phone: [phone] Fax: [phone]    Your procedure is scheduled on  Monday, August 20th   Report to [facility] Admitting at [facility]            (posted surgery time 2:00p - 3:00p)   Call this number if you have problems the morning of surgery:  [phone], for other questions, call [phone] Mon-Fri from 8a-4p   Remember:  Do not eat food or drink liquids after midnight Sunday.   Take these medicines the morning of surgery with A SIP OF WATER : Xanax, Metoprolol.  Please use inhaler in the morning.              4-5 days prior to surgery, STOP taking any vitamins, herbal supplements, anti-inflammatories.              Stop taking Xarelto as of____________________________   Do not wear jewelry, make-up or nail polish.  Do not wear lotions, powders, perfumes, or deoderant.  Do not shave 48 hours prior to surgery.    Do not bring valuables to the hospital.  [facility] is not responsible for any belongings or valuables.  Contacts, dentures or bridgework may not be worn into surgery.  Leave your suitcase in the car.  After surgery it may be brought to your room.  For patients admitted to the hospital, discharge time will be determined by your treatment team.  Please read over the following fact sheets that you were given. Pain Booklet, MRSA Information and Surgical Site Infection Prevention     How to Manage Your Diabetes Before and After Surgery  Why is it important to control my blood sugar before and after surgery? . Improving blood sugar levels before and after surgery helps healing and can limit problems. . A way of improving blood sugar control is eating a healthy diet by: .  o  Eating less sugar and carbohydrates o  Increasing activity/exercise o  Talking with your doctor about  reaching your blood sugar goals o  . High blood sugars (greater than 180 mg/dL) can raise your risk of infections and slow your recovery, so you will need to focus on controlling your diabetes during the weeks before surgery. . Make sure that the doctor who takes care of your diabetes knows about your planned surgery including the date and location.  How do I manage my blood sugar before surgery? . Check your blood sugar at least 4 times a day, starting 2 days before surgery, to make sure that the level is not too high or low. o Check your blood sugar the morning of your surgery when you wake up and every 2 hours until you get to the Short Stay unit. o  . If your blood sugar is less than 70 mg/dL, you will need to treat for low blood sugar: o Do not take insulin. o Treat a low blood sugar (less than 70 mg/dL) with  cup of clear juice (cranberry or apple), 4 glucose tablets, OR glucose gel. o  o Recheck blood sugar in 15 minutes after treatment (to make sure it is greater than 70 mg/dL). If your blood sugar is not greater than 70 mg/dL on recheck, call [phone] for further instructions. . Report your blood sugar to the short stay  nurse when you get to Short Stay.  . If you are admitted to the hospital after surgery: o Your blood sugar will be checked by the staff and you will probably be given insulin after surgery (instead of oral diabetes medicines) to make sure you have good blood sugar levels. o The goal for blood sugar control after surgery is 80-180 mg/dL.  WHAT DO I DO ABOUT MY DIABETES MEDICATION?   [name] Do not take oral diabetes medicines (pills) the morning of surgery!!     Patient Signature:  Date:   Nurse Signature:  Date:   Reviewed and Endorsed by [facility] Patient Education Committee, August 2015     [facility]- Preparing For Surgery  Before surgery, you can play an important role. Because skin is not sterile, your skin needs to be as free of germs as possible.  You can reduce the number of germs on your skin by washing with CHG (chlorahexidine gluconate) Soap before surgery.  CHG is an antiseptic cleaner which kills germs and bonds with the skin to continue killing germs even after washing.  Please do not use if you have an allergy to CHG or antibacterial soaps. If your skin becomes reddened/irritated stop using the CHG.  Do not shave (including legs and underarms) for at least 48 hours prior to first CHG shower. It is OK to shave your face.  Please follow these instructions carefully.   1. Shower the NIGHT BEFORE SURGERY and the MORNING OF SURGERY with CHG.   2. If you chose to wash your hair, wash your hair first as usual with your normal shampoo.  3. After you shampoo, rinse your hair and body thoroughly to remove the shampoo.  4. Use CHG as you would any other liquid soap. You can apply CHG directly to the skin and wash gently with a scrungie or a clean washcloth.   5. Apply the CHG Soap to your body ONLY FROM THE NECK DOWN.  Do not use on open wounds or open sores. Avoid contact with your eyes, ears, mouth and genitals (private parts). Wash genitals (private parts) with your normal soap.  6. Wash thoroughly, paying special attention to the area where your surgery will be performed.  7. Thoroughly rinse your body with warm water from the neck down.  8. DO NOT shower/wash with your normal soap after using and rinsing off the CHG Soap.  9. Pat yourself dry with a CLEAN TOWEL.   10. Wear CLEAN PAJAMAS   11. Place CLEAN SHEETS on your bed the night of your first shower and DO NOT SLEEP WITH PETS.    Day of Surgery: Do not apply any deodorants/lotions. Please wear clean clothes to the hospital/surgery center.

## 2016-09-07 ENCOUNTER — Encounter (HOSPITAL_COMMUNITY)
Admission: RE | Admit: 2016-09-07 | Discharge: 2016-09-07 | Disposition: A | Discharge status: Home / Self Care | Payer: BC Managed Care – PPO | Source: Ambulatory Visit | Attending: General Surgery | Admitting: General Surgery

## 2016-09-07 ENCOUNTER — Encounter (HOSPITAL_COMMUNITY): Payer: Self-pay

## 2016-09-07 ENCOUNTER — Encounter: Payer: Self-pay | Admitting: General Practice

## 2016-09-07 DIAGNOSIS — Z01818 Encounter for other preprocedural examination: Secondary | ICD-10-CM | POA: Insufficient documentation

## 2016-09-07 HISTORY — DX: Fatty (change of) liver, not elsewhere classified: K76.0

## 2016-09-07 HISTORY — DX: Bipolar disorder, unspecified: F31.9

## 2016-09-07 HISTORY — DX: Headache: R51

## 2016-09-07 HISTORY — DX: Headache, unspecified: R51.9

## 2016-09-07 LAB — BASIC METABOLIC PANEL
ANION GAP: 9 (ref 5–15)
BUN: 15 mg/dL (ref 6–20)
CALCIUM: 9.2 mg/dL (ref 8.9–10.3)
CO2: 27 mmol/L (ref 22–32)
CREATININE: 0.99 mg/dL (ref 0.44–1.00)
Chloride: 105 mmol/L (ref 101–111)
GFR calc Af Amer: >60 mL/min (ref >60)
GLUCOSE: 136 mg/dL — AB (ref 65–99)
Potassium: 4 mmol/L (ref 3.5–5.1)
Sodium: 141 mmol/L (ref 135–145)

## 2016-09-07 LAB — CBC
HEMATOCRIT: 43.5 % (ref 36.0–46.0)
HEMOGLOBIN: 14.8 g/dL (ref 12.0–15.0)
MCH: 29.3 pg (ref 26.0–34.0)
MCHC: 34 g/dL (ref 30.0–36.0)
MCV: 86.1 fL (ref 78.0–100.0)
PLATELETS: 130 10*3/uL — AB (ref 150–400)
RBC: 5.05 MIL/uL (ref 3.87–5.11)
RDW: 14.3 % (ref 11.5–15.5)
WBC: 6.5 10*3/uL (ref 4.0–10.5)

## 2016-09-07 LAB — GLUCOSE, CAPILLARY: Glucose-Capillary: 185 mg/dL — ABNORMAL HIGH (ref 65–99)

## 2016-09-07 NOTE — [facility] Notes (Signed)
Pt has hx of MI, A-fib and CHF. Pt has cardiac clearance from Dr. [name] noted in [facility], 08/31/16. Pt states she was told by Dr. Lear Ng office to stop Xarelto 2 days prior to surgery, but then received a message thru MyChart that she was to stop it 4 days prior. I called Dr. Lear Ng office and spoke with Sunday Spillers, RN and she states it's documented in pt's chart that she is to stop is 2 days prior. I then told pt to follow Dr. Lear Ng instructions. Pt does have another surgery scheduled at [facility] after this surgery. I told her to call that surgeon's office to verify when they want her to stop Xarelto. She voiced understanding.  Pt is diabetic, diagnosed last December. Was put on Metformin in July, 2018 because A1C was 8.2. She states she does not have a meter at home so she is unable to check her blood sugar. Today's CBG was 185 and she states she had eaten around 9:00 AM.

## 2016-09-07 NOTE — Progress Notes (Signed)
   09/07/16 1057  OBSTRUCTIVE SLEEP APNEA  Have you ever been diagnosed with sleep apnea through a sleep study? No  Do you snore loudly (loud enough to be heard through closed doors)?  1  Do you often feel tired, fatigued, or sleepy during the daytime (such as falling asleep during driving or talking to someone)? 1  Has anyone observed you stop breathing during your sleep? 0  Do you have, or are you being treated for high blood pressure? 1  BMI more than 35 kg/m2? 1  Age > 50 (1-yes) 1  Neck circumference greater than:Female 16 inches or larger, Female 17inches or larger? 1  Female Gender (Yes=1) 0  Obstructive Sleep Apnea Score 6  Score 5 or greater  Results sent to PCP

## 2016-09-07 NOTE — Pre-Procedure Instructions (Signed)
Kathleen [name]  09/07/2016    Your procedure is scheduled on  Monday, September 11, 2016 at 2:00 PM.   Report to [facility] Entrance "A" Admitting Office at 12:00 N.              Call this number if you have problems the morning of surgery: [phone]   Questions prior to day of surgery, please call [phone] between 8 & 4 PM.   Remember:  Do not eat food or drink liquids after midnight Sunday.             Take these medicines the morning of surgery with A SIP OF WATER: Metoprolol (Toprol XL), Alprazolam (Xanax) - if needed, Albuterol inhaler - if needed (bring inhaler with you day of surgery)               Do not use Aspirin products, Multivitamins, Herbal medications or NSAIDS (Ibuprofen, Aleve, etc) 5 days prior to surgery              Follow surgeon's instructions for stopping Xarelto.   Do not wear jewelry, make-up or nail polish.  Do not wear lotions, powders, perfumes or deodorant.  Do not shave 48 hours prior to surgery.    Do not bring valuables to the hospital.  [facility] is not responsible for any belongings or valuables.  Contacts, dentures or bridgework may not be worn into surgery.  Leave your suitcase in the car.  After surgery it may be brought to your room.  For patients admitted to the hospital, discharge time will be determined by your treatment team.  How to Manage Your Diabetes Before and After Surgery  Why is it important to control my blood sugar before and after surgery? . Improving blood sugar levels before and after surgery helps healing and can limit problems. . A way of improving blood sugar control is eating a healthy diet by: .  o  Eating less sugar and carbohydrates o  Increasing activity/exercise o  Talking with your doctor about reaching your blood sugar goals o  . High blood sugars (greater than 180 mg/dL) can raise your risk of infections and slow your recovery, so you will need to focus on controlling your diabetes during the  weeks before surgery. . Make sure that the doctor who takes care of your diabetes knows about your planned surgery including the date and location.  How do I manage my blood sugar before surgery? . Check your blood sugar at least 4 times a day, starting 2 days before surgery, to make sure that the level is not too high or low. o Check your blood sugar the morning of your surgery when you wake up and every 2 hours until you get to the Short Stay unit. o  . If your blood sugar is less than 70 mg/dL, you will need to treat for low blood sugar: o Do not take insulin. o Treat a low blood sugar (less than 70 mg/dL) with  cup of clear juice (cranberry or apple), 4 glucose tablets, OR glucose gel. o  o Recheck blood sugar in 15 minutes after treatment (to make sure it is greater than 70 mg/dL). If your blood sugar is not greater than 70 mg/dL on recheck, call [phone] for further instructions. . Report your blood sugar to the short stay nurse when you get to Short Stay.  . If you are admitted to the hospital after surgery: o Your blood sugar will  be checked by the staff and you will probably be given insulin after surgery (instead of oral diabetes medicines) to make sure you have good blood sugar levels. o The goal for blood sugar control after surgery is 80-180 mg/dL.  WHAT DO I DO ABOUT MY DIABETES MEDICATION?   [name] Do not take oral diabetes medicines (pills) the morning of surgery!!    [place]- Preparing For Surgery  Before surgery, you can play an important role. Because skin is not sterile, your skin needs to be as free of germs as possible. You can reduce the number of germs on your skin by washing with CHG (chlorahexidine gluconate) Soap before surgery.  CHG is an antiseptic cleaner which kills germs and bonds with the skin to continue killing germs even after washing.  Please do not use if you have an allergy to CHG or antibacterial soaps. If your skin becomes reddened/irritated  stop using the CHG.  Do not shave (including legs and underarms) for at least 48 hours prior to first CHG shower. It is OK to shave your face.  Please follow these instructions carefully.   1. Shower the NIGHT BEFORE SURGERY and the MORNING OF SURGERY with CHG.   2. If you chose to wash your hair, wash your hair first as usual with your normal shampoo.  3. After you shampoo, rinse your hair and body thoroughly to remove the shampoo.  4. Use CHG as you would any other liquid soap. You can apply CHG directly to the skin and wash gently with a scrungie or a clean washcloth.   5. Apply the CHG Soap to your body ONLY FROM THE NECK DOWN.  Do not use on open wounds or open sores. Avoid contact with your eyes, ears, mouth and genitals (private parts). Wash genitals (private parts) with your normal soap.  6. Wash thoroughly, paying special attention to the area where your surgery will be performed.  7. Thoroughly rinse your body with warm water from the neck down.  8. DO NOT shower/wash with your normal soap after using and rinsing off the CHG Soap.  9. Pat yourself dry with a CLEAN TOWEL.   10. Wear CLEAN PAJAMAS   11. Place CLEAN SHEETS on your bed the night of your first shower and DO NOT SLEEP WITH PETS.    Day of Surgery: Do not apply any deodorants/lotions. Please wear clean clothes to the hospital.    Please review fact sheets provided

## 2016-09-08 ENCOUNTER — Ambulatory Visit
Admission: RE | Admit: 2016-09-08 | Discharge: 2016-09-08 | Disposition: A | Discharge status: Home / Self Care | Payer: BC Managed Care – PPO | Source: Ambulatory Visit | Attending: General Surgery | Admitting: General Surgery

## 2016-09-08 DIAGNOSIS — D242 Benign neoplasm of left breast: Secondary | ICD-10-CM

## 2016-09-08 MED ORDER — DEXTROSE 5 % IV SOLN
3.0000 g | INTRAVENOUS | Status: AC
  Administered 2016-09-11: 3 g via INTRAVENOUS
  Filled 2016-09-08: qty 3000

## 2016-09-11 ENCOUNTER — Ambulatory Visit
Admission: RE | Admit: 2016-09-11 | Discharge: 2016-09-11 | Disposition: A | Discharge status: Home / Self Care | Payer: BC Managed Care – PPO | Source: Ambulatory Visit | Attending: General Surgery | Admitting: General Surgery

## 2016-09-11 ENCOUNTER — Ambulatory Visit (HOSPITAL_COMMUNITY): Payer: BC Managed Care – PPO | Admitting: Anesthesiology

## 2016-09-11 ENCOUNTER — Encounter (HOSPITAL_COMMUNITY)
Admission: RE | Disposition: A | Discharge status: Home / Self Care | Payer: Self-pay | Source: Ambulatory Visit | Attending: General Surgery

## 2016-09-11 ENCOUNTER — Encounter (HOSPITAL_COMMUNITY): Payer: Self-pay | Admitting: Urology

## 2016-09-11 ENCOUNTER — Ambulatory Visit (HOSPITAL_COMMUNITY)
Admission: RE | Admit: 2016-09-11 | Discharge: 2016-09-11 | Disposition: A | Discharge status: Home / Self Care | Payer: BC Managed Care – PPO | Source: Ambulatory Visit | Attending: General Surgery | Admitting: General Surgery

## 2016-09-11 DIAGNOSIS — D242 Benign neoplasm of left breast: Secondary | ICD-10-CM | POA: Insufficient documentation

## 2016-09-11 DIAGNOSIS — Z79899 Other long term (current) drug therapy: Secondary | ICD-10-CM | POA: Insufficient documentation

## 2016-09-11 DIAGNOSIS — Z7984 Long term (current) use of oral hypoglycemic drugs: Secondary | ICD-10-CM | POA: Insufficient documentation

## 2016-09-11 DIAGNOSIS — I252 Old myocardial infarction: Secondary | ICD-10-CM | POA: Insufficient documentation

## 2016-09-11 DIAGNOSIS — Z803 Family history of malignant neoplasm of breast: Secondary | ICD-10-CM | POA: Diagnosis not present

## 2016-09-11 DIAGNOSIS — I509 Heart failure, unspecified: Secondary | ICD-10-CM | POA: Diagnosis not present

## 2016-09-11 DIAGNOSIS — N6342 Unspecified lump in left breast, subareolar: Secondary | ICD-10-CM | POA: Insufficient documentation

## 2016-09-11 DIAGNOSIS — F319 Bipolar disorder, unspecified: Secondary | ICD-10-CM | POA: Insufficient documentation

## 2016-09-11 DIAGNOSIS — E119 Type 2 diabetes mellitus without complications: Secondary | ICD-10-CM | POA: Insufficient documentation

## 2016-09-11 DIAGNOSIS — I2510 Atherosclerotic heart disease of native coronary artery without angina pectoris: Secondary | ICD-10-CM | POA: Diagnosis not present

## 2016-09-11 DIAGNOSIS — I110 Hypertensive heart disease with heart failure: Secondary | ICD-10-CM | POA: Insufficient documentation

## 2016-09-11 DIAGNOSIS — G4730 Sleep apnea, unspecified: Secondary | ICD-10-CM | POA: Insufficient documentation

## 2016-09-11 DIAGNOSIS — F419 Anxiety disorder, unspecified: Secondary | ICD-10-CM | POA: Insufficient documentation

## 2016-09-11 DIAGNOSIS — R51 Headache: Secondary | ICD-10-CM | POA: Insufficient documentation

## 2016-09-11 HISTORY — PX: BREAST LUMPECTOMY WITH RADIOACTIVE SEED LOCALIZATION: SHX6424

## 2016-09-11 LAB — HEPATIC FUNCTION PANEL
ALT: 18 U/L (ref 14–54)
AST: 20 U/L (ref 15–41)
Albumin: 3.8 g/dL (ref 3.5–5.0)
Alkaline Phosphatase: 78 U/L (ref 38–126)
Bilirubin, Direct: 0.1 mg/dL (ref 0.1–0.5)
Indirect Bilirubin: 0.6 mg/dL (ref 0.3–0.9)
TOTAL PROTEIN: 6.7 g/dL (ref 6.5–8.1)
Total Bilirubin: 0.7 mg/dL (ref 0.3–1.2)

## 2016-09-11 LAB — PROTIME-INR
INR: 0.91
Prothrombin Time: 12.2 seconds (ref 11.4–15.2)

## 2016-09-11 LAB — GLUCOSE, CAPILLARY
GLUCOSE-CAPILLARY: 96 mg/dL (ref 65–99)
Glucose-Capillary: 109 mg/dL — ABNORMAL HIGH (ref 65–99)
Glucose-Capillary: 90 mg/dL (ref 65–99)

## 2016-09-11 LAB — APTT: aPTT: 26 seconds (ref 24–36)

## 2016-09-11 SURGERY — BREAST LUMPECTOMY WITH RADIOACTIVE SEED LOCALIZATION
Anesthesia: General | Site: Breast | Laterality: Left

## 2016-09-11 MED ORDER — TRAMADOL HCL 50 MG PO TABS: 50.0000 mg | ORAL_TABLET | Freq: Four times a day (QID) | ORAL | 0 refills | Status: DC | PRN

## 2016-09-11 MED ORDER — CHLORHEXIDINE GLUCONATE 4 % EX LIQD: 60.0000 mL | Freq: Once | CUTANEOUS | Status: DC

## 2016-09-11 MED ORDER — OXYCODONE HCL 5 MG PO TABS: 5.0000 mg | ORAL_TABLET | Freq: Once | ORAL | Status: DC | PRN

## 2016-09-11 MED ORDER — SUGAMMADEX SODIUM 200 MG/2ML IV SOLN
INTRAVENOUS | Status: DC | PRN
  Administered 2016-09-11: 350 mg via INTRAVENOUS

## 2016-09-11 MED ORDER — MIDAZOLAM HCL 2 MG/2ML IJ SOLN
INTRAMUSCULAR | Status: DC | PRN
Start: 2016-09-11 — End: 2016-09-11
  Administered 2016-09-11 (×2): 1 mg via INTRAVENOUS

## 2016-09-11 MED ORDER — ONDANSETRON HCL 4 MG/2ML IJ SOLN
INTRAMUSCULAR | Status: DC | PRN
  Administered 2016-09-11: 4 mg via INTRAVENOUS

## 2016-09-11 MED ORDER — FENTANYL CITRATE (PF) 250 MCG/5ML IJ SOLN
INTRAMUSCULAR | Status: AC
Start: 2016-09-11 — End: 2016-09-11
  Filled 2016-09-11: qty 5

## 2016-09-11 MED ORDER — DEXAMETHASONE SODIUM PHOSPHATE 10 MG/ML IJ SOLN
INTRAMUSCULAR | Status: DC | PRN
  Administered 2016-09-11: 4 mg via INTRAVENOUS

## 2016-09-11 MED ORDER — LIDOCAINE 2% (20 MG/ML) 5 ML SYRINGE
INTRAMUSCULAR | Status: DC | PRN
  Administered 2016-09-11: 80 mg via INTRAVENOUS

## 2016-09-11 MED ORDER — BUPIVACAINE-EPINEPHRINE (PF) 0.25% -1:200000 IJ SOLN
INTRAMUSCULAR | Status: AC
  Filled 2016-09-11: qty 30

## 2016-09-11 MED ORDER — ACETAMINOPHEN 500 MG PO TABS
1000.0000 mg | ORAL_TABLET | ORAL | Status: AC
  Administered 2016-09-11: 1000 mg via ORAL
  Filled 2016-09-11: qty 2

## 2016-09-11 MED ORDER — MEPERIDINE HCL 25 MG/ML IJ SOLN: 6.2500 mg | INTRAMUSCULAR | Status: DC | PRN

## 2016-09-11 MED ORDER — OXYCODONE HCL 5 MG/5ML PO SOLN: 5.0000 mg | Freq: Once | ORAL | Status: DC | PRN

## 2016-09-11 MED ORDER — BUPIVACAINE-EPINEPHRINE 0.25% -1:200000 IJ SOLN
INTRAMUSCULAR | Status: DC | PRN
  Administered 2016-09-11: 30 mL

## 2016-09-11 MED ORDER — HYDROMORPHONE HCL 1 MG/ML IJ SOLN: 0.2500 mg | INTRAMUSCULAR | Status: DC | PRN

## 2016-09-11 MED ORDER — LACTATED RINGERS IV SOLN
INTRAVENOUS | Status: DC
  Administered 2016-09-11: 13:00:00 via INTRAVENOUS

## 2016-09-11 MED ORDER — PROPOFOL 10 MG/ML IV BOLUS
INTRAVENOUS | Status: AC
Start: 2016-09-11 — End: 2016-09-11
  Filled 2016-09-11: qty 20

## 2016-09-11 MED ORDER — ROCURONIUM BROMIDE 10 MG/ML (PF) SYRINGE
PREFILLED_SYRINGE | INTRAVENOUS | Status: DC | PRN
  Administered 2016-09-11: 60 mg via INTRAVENOUS

## 2016-09-11 MED ORDER — FENTANYL CITRATE (PF) 250 MCG/5ML IJ SOLN
INTRAMUSCULAR | Status: DC | PRN
  Administered 2016-09-11: 100 ug via INTRAVENOUS

## 2016-09-11 MED ORDER — ONDANSETRON HCL 4 MG/2ML IJ SOLN: 4.0000 mg | Freq: Once | INTRAMUSCULAR | Status: DC | PRN

## 2016-09-11 MED ORDER — MIDAZOLAM HCL 2 MG/2ML IJ SOLN
INTRAMUSCULAR | Status: AC
Start: 2016-09-11 — End: 2016-09-11
  Filled 2016-09-11: qty 2

## 2016-09-11 MED ORDER — PROPOFOL 10 MG/ML IV BOLUS
INTRAVENOUS | Status: DC | PRN
  Administered 2016-09-11: 150 mg via INTRAVENOUS

## 2016-09-11 MED ORDER — GABAPENTIN 300 MG PO CAPS
300.0000 mg | ORAL_CAPSULE | ORAL | Status: AC
  Administered 2016-09-11: 300 mg via ORAL
  Filled 2016-09-11: qty 1

## 2016-09-11 MED ORDER — FENTANYL CITRATE (PF) 100 MCG/2ML IJ SOLN: 25.0000 ug | INTRAMUSCULAR | Status: DC | PRN

## 2016-09-11 MED ORDER — SCOPOLAMINE 1 MG/3DAYS TD PT72
1.0000 | MEDICATED_PATCH | Freq: Once | TRANSDERMAL | Status: DC
  Administered 2016-09-11: 1.5 mg via TRANSDERMAL
  Filled 2016-09-11: qty 1

## 2016-09-11 MED ORDER — PROMETHAZINE HCL 25 MG/ML IJ SOLN: 6.2500 mg | INTRAMUSCULAR | Status: DC | PRN

## 2016-09-11 MED ORDER — 0.9 % SODIUM CHLORIDE (POUR BTL) OPTIME
TOPICAL | Status: DC | PRN
Start: 2016-09-11 — End: 2016-09-11
  Administered 2016-09-11: 1000 mL

## 2016-09-11 SURGICAL SUPPLY — 43 items
ADH SKN CLS APL DERMABOND .7 (GAUZE/BANDAGES/DRESSINGS) ×1
BINDER BREAST 3XL (GAUZE/BANDAGES/DRESSINGS) ×1 IMPLANT
BINDER BREAST XXLRG (GAUZE/BANDAGES/DRESSINGS) IMPLANT
BLADE SURG 15 STRL LF DISP TIS (BLADE) ×1 IMPLANT
BLADE SURG 15 STRL SS (BLADE) ×2
CANISTER SUCT 3000ML PPV (MISCELLANEOUS) ×2 IMPLANT
CHLORAPREP W/TINT 26ML (MISCELLANEOUS) ×2 IMPLANT
CLIP VESOCCLUDE SM WIDE 6/CT (CLIP) ×2 IMPLANT
COVER PROBE W GEL 5X96 (DRAPES) ×2 IMPLANT
COVER SURGICAL LIGHT HANDLE (MISCELLANEOUS) ×2 IMPLANT
DECANTER SPIKE VIAL GLASS SM (MISCELLANEOUS) ×1 IMPLANT
DERMABOND ADVANCED (GAUZE/BANDAGES/DRESSINGS) ×1
DERMABOND ADVANCED .7 DNX12 (GAUZE/BANDAGES/DRESSINGS) ×1 IMPLANT
DEVICE DUBIN SPECIMEN MAMMOGRA (MISCELLANEOUS) ×2 IMPLANT
DRAPE CHEST BREAST 15X10 FENES (DRAPES) ×2 IMPLANT
DRAPE UTILITY XL STRL (DRAPES) ×2 IMPLANT
DRSG PAD ABDOMINAL 8X10 ST (GAUZE/BANDAGES/DRESSINGS) ×2 IMPLANT
ELECT COATED BLADE 2.86 ST (ELECTRODE) ×2 IMPLANT
ELECT REM PT RETURN 9FT ADLT (ELECTROSURGICAL) ×2
ELECTRODE REM PT RTRN 9FT ADLT (ELECTROSURGICAL) ×1 IMPLANT
GLOVE BIOGEL PI IND STRL 8 (GLOVE) ×1 IMPLANT
GLOVE BIOGEL PI INDICATOR 8 (GLOVE) ×1
GLOVE ECLIPSE 7.5 STRL STRAW (GLOVE) ×3 IMPLANT
GOWN STRL REUS W/ TWL LRG LVL3 (GOWN DISPOSABLE) ×1 IMPLANT
GOWN STRL REUS W/ TWL XL LVL3 (GOWN DISPOSABLE) ×1 IMPLANT
GOWN STRL REUS W/TWL LRG LVL3 (GOWN DISPOSABLE) ×2
GOWN STRL REUS W/TWL XL LVL3 (GOWN DISPOSABLE) ×2
KIT BASIN OR (CUSTOM PROCEDURE TRAY) ×2 IMPLANT
KIT MARKER MARGIN INK (KITS) ×2 IMPLANT
NDL HYPO 25GX1X1/2 BEV (NEEDLE) ×1 IMPLANT
NEEDLE HYPO 25GX1X1/2 BEV (NEEDLE) ×2 IMPLANT
NS IRRIG 1000ML POUR BTL (IV SOLUTION) ×2 IMPLANT
PACK SURGICAL SETUP 50X90 (CUSTOM PROCEDURE TRAY) ×2 IMPLANT
PENCIL BUTTON HOLSTER BLD 10FT (ELECTRODE) ×2 IMPLANT
SPONGE LAP 18X18 X RAY DECT (DISPOSABLE) ×2 IMPLANT
SUT MON AB 5-0 PS2 18 (SUTURE) ×2 IMPLANT
SUT VIC AB 3-0 SH 18 (SUTURE) ×2 IMPLANT
SYR BULB 3OZ (MISCELLANEOUS) ×2 IMPLANT
SYR CONTROL 10ML LL (SYRINGE) ×2 IMPLANT
TOWEL OR 17X24 6PK STRL BLUE (TOWEL DISPOSABLE) ×2 IMPLANT
TOWEL OR 17X26 10 PK STRL BLUE (TOWEL DISPOSABLE) ×2 IMPLANT
TUBE CONNECTING 12X1/4 (SUCTIONS) ×2 IMPLANT
YANKAUER SUCT BULB TIP NO VENT (SUCTIONS) ×2 IMPLANT

## 2016-09-11 NOTE — Interval H&P Note (Signed)
History and Physical Interval Note:  09/11/2016 4:02 PM  Kathleen [name]  has presented today for surgery, with the diagnosis of papilloma left breast  The various methods of treatment have been discussed with the patient and family. After consideration of risks, benefits and other options for treatment, the patient has consented to  Procedure(s): LEFT BREAST LUMPECTOMY WITH RADIOACTIVE SEED LOCALIZATION (Left) as a surgical intervention .  The patient's history has been reviewed, patient examined, no change in status, stable for surgery.  I have reviewed the patient's chart and labs.  Questions were answered to the patient's satisfaction.     [name] T

## 2016-09-11 NOTE — Anesthesia Procedure Notes (Signed)
Procedure Name: Intubation Date/Time: 09/11/2016 4:21 PM Performed by: Julieta Bellini Pre-anesthesia Checklist: Patient identified, Emergency Drugs available, Suction available and Patient being monitored Patient Re-evaluated:Patient Re-evaluated prior to induction Oxygen Delivery Method: Circle system utilized Preoxygenation: Pre-oxygenation with 100% oxygen Induction Type: IV induction Ventilation: Mask ventilation without difficulty Laryngoscope Size: Mac and 4 Grade View: Grade II Tube type: Oral Tube size: 7.5 mm Number of attempts: 2 Airway Equipment and Method: Stylet Placement Confirmation: ETT inserted through vocal cords under direct vision,  positive ETCO2 and breath sounds checked- equal and bilateral Secured at: 22 cm Tube secured with: Tape Dental Injury: Teeth and Oropharynx as per pre-operative assessment  Comments: CRNA attempted x1 with a Grade 3 view Mac 4. Not successful. Repositioned patient and Dr. Sabra Heck inserted 7.59m tube x1 attempt Grade 2 view with a Mac 4

## 2016-09-11 NOTE — Anesthesia Postprocedure Evaluation (Signed)
Anesthesia Post Note  Patient: Kathleen [name]  Procedure(s) Performed: Procedure(s) (LRB): LEFT BREAST LUMPECTOMY WITH RADIOACTIVE SEED LOCALIZATION (Left)     Patient location during evaluation: PACU Anesthesia Type: General Level of consciousness: awake and alert Pain management: pain level controlled Vital Signs Assessment: post-procedure vital signs reviewed and stable Respiratory status: spontaneous breathing, nonlabored ventilation, respiratory function stable and patient connected to nasal cannula oxygen Cardiovascular status: blood pressure returned to baseline and stable Postop Assessment: no signs of nausea or vomiting Anesthetic complications: no    Last Vitals:  Vitals:   09/11/16 1753 09/11/16 1757  BP: 114/78   Pulse: 72   Resp: 14   Temp: 36.6 C   SpO2: (!) 89% 95%    Last Pain:  Vitals:   09/11/16 1218  TempSrc: Oral                 Lynda Rainwater

## 2016-09-11 NOTE — Discharge Instructions (Signed)
[facility] °Office Phone Number [phone] ° °BREAST BIOPSY/ PARTIAL MASTECTOMY: POST OP INSTRUCTIONS ° °Always review your discharge instruction sheet given to you by the facility where your surgery was performed. ° °IF YOU HAVE DISABILITY OR FAMILY LEAVE FORMS, YOU MUST BRING THEM TO THE OFFICE FOR PROCESSING.  DO NOT GIVE THEM TO YOUR DOCTOR. ° °1. A prescription for pain medication may be given to you upon discharge.  Take your pain medication as prescribed, if needed.  If narcotic pain medicine is not needed, then you may take acetaminophen (Tylenol) or ibuprofen (Advil) as needed. °2. Take your usually prescribed medications unless otherwise directed °3. If you need a refill on your pain medication, please contact your pharmacy.  They will contact our office to request authorization.  Prescriptions will not be filled after 5pm or on week-ends. °4. You should eat very light the first 24 hours after surgery, such as soup, crackers, pudding, etc.  Resume your normal diet the day after surgery. °5. Most patients will experience some swelling and bruising in the breast.  Ice packs and a good support bra will help.  Swelling and bruising can take several days to resolve.  °6. It is common to experience some constipation if taking pain medication after surgery.  Increasing fluid intake and taking a stool softener will usually help or prevent this problem from occurring.  A mild laxative (Milk of Magnesia or Miralax) should be taken according to package directions if there are no bowel movements after 48 hours. °7. Unless discharge instructions indicate otherwise, you may remove your bandages 24-48 hours after surgery, and you may shower at that time.  You may have steri-strips (small skin tapes) in place directly over the incision.  These strips should be left on the skin for 7-10 days.  If your surgeon used skin glue on the incision, you may shower in 24 hours.  The glue will flake off over the  next 2-3 weeks.  Any sutures or staples will be removed at the office during your follow-up visit. °8. ACTIVITIES:  You may resume regular daily activities (gradually increasing) beginning the next day.  Wearing a good support bra or sports bra minimizes pain and swelling.  You may have sexual intercourse when it is comfortable. °a. You may drive when you no longer are taking prescription pain medication, you can comfortably wear a seatbelt, and you can safely maneuver your car and apply brakes. °b. RETURN TO WORK:  ______________________________________________________________________________________ °9. You should see your doctor in the office for a follow-up appointment approximately two weeks after your surgery.  Your doctor’s nurse will typically make your follow-up appointment when she calls you with your pathology report.  Expect your pathology report 2-3 business days after your surgery.  You may call to check if you do not hear from us after three days. °10. OTHER INSTRUCTIONS: _______________________________________________________________________________________________ _____________________________________________________________________________________________________________________________________ °_____________________________________________________________________________________________________________________________________ °_____________________________________________________________________________________________________________________________________ ° °WHEN TO CALL YOUR DOCTOR: °1. Fever over 101.0 °2. Nausea and/or vomiting. °3. Extreme swelling or bruising. °4. Continued bleeding from incision. °5. Increased pain, redness, or drainage from the incision. ° °The clinic staff is available to answer your questions during regular business hours.  Please don’t hesitate to call and ask to speak to one of the nurses for clinical concerns.  If you have a medical emergency, go to the nearest  emergency room or call 911.  A surgeon from [facility] is always on call at the hospital. ° °For further questions, please visit [email or web address]  °

## 2016-09-11 NOTE — Op Note (Signed)
Preoperative Diagnosis: papilloma left breast  Postoprative Diagnosis: papilloma left breast  Procedure: Procedure(s): LEFT BREAST LUMPECTOMY WITH RADIOACTIVE SEED LOCALIZATION   Surgeon: Excell Seltzer T   Assistants: None  Anesthesia:  General endotracheal anesthesia  Indications: Patient is a 61 year old female with a strong family history of breast cancer who on a recent screening MRI was found to have a 1.2 cm enhancing subareolar mass in the left breast. Large core needle biopsy revealed papilloma. Particularly in light of her family history after discussion of options we have elected to proceed with radioactive seed localized excision to rule out underlying malignancy. The indications for the procedure, its nature and recovery and risks have been discussed in detailed elsewhere and she agrees to proceed.    Procedure Detail:  Patient had previously undergone accurate placement of a radioactive seed at the clip site in the left breast. She was brought to the operating room, placed in supine position on the operating table, and general endotracheal anesthesia induced. She received preoperative IV antibiotics. PAS were in place. The left breast was widely sterilely prepped and draped. Patient timeout was performed and correct procedure verified. The neoprobe was used to localize the seed just under the medial aspect of the areola on the left. A curvilinear incision was made at the areolar border and dissection carried down through the subcutaneous tissue to the breast capsule. Using the neoprobe for guidance the dissection was widened out around the area of the seed and a globular specimen of breast tissue measuring approximately 2-1/2 cm in diameter was excised. This was inked for margins and specimen x-ray showed the see the marking clip centrally located within the specimen. It was sent for permanent pathology. The wound was irrigated and complete hemostasis assured. The soft tissue was  extensively infiltrated with Marcaine. Marking clips were placed in the lumpectomy cavity. The deep breast and subcutaneous tissue was closed with interrupted 3-0 Vicryl and the skin with a running subcuticular 5-0 Monocryl and Dermabond. Sponge needle and instrument counts were correct.    Findings: As above  Estimated Blood Loss:  Minimal         Drains: None  Blood Given: none          Specimens: Left breast lumpectomy margins inked        Complications:  * No complications entered in OR log *         Disposition: PACU - hemodynamically stable.         Condition: stable

## 2016-09-11 NOTE — H&P (Signed)
History of Present Illness Kathleen [name] T. [name] MD; 08/11/2016 9:35 AM) The patient is a 61 year old female who presents with a complaint of Breast problems. Patient is a 61 year old female referred by Dr. Nolon [name] for a recent abnormal MRI and core biopsy showing papilloma. She states she has a history of breast cancer in her mother in her 26s and in her grandmother. She has been getting every other year screening MRI due to dense category C breast tissue and family history. Mammogram was negative in February. MRI in June of this year showed a 1.2 x 0.9 cm irregular enhancing mass in the lower inner left breast anterior depth. MRI guided core biopsy was recommended and performed. This has revealed an intraductal papilloma and fibrocystic changes. Patient has had some discomfort in her breast since the MRI did not previously. Some nipple inversion just since the biopsy. She has had previous benign cysts aspirated. No personal history of breast cancer.   Past Surgical History Kathleen [name], [name]; 08/11/2016 8:45 AM) Knee Surgery  Right. Tonsillectomy   Diagnostic Studies History Kathleen [name], [name]; 08/11/2016 8:45 AM) Colonoscopy  5-10 years ago Mammogram  within last year Pap Smear  1-5 years ago  Allergies Kathleen [name], CMA; 08/11/2016 8:46 AM) Eggs or Egg-derived Products  LaMICtal *ANTICONVULSANTS*   Medication History Kathleen [name], CMA; 08/11/2016 8:46 AM) Xarelto (20MG  Tablet, Oral) Active. Metoprolol Succinate ER (25MG  Tablet ER 24HR, Oral) Active. Hydrocodone-Acetaminophen (5-325MG  Tablet, Oral as needed) Active. ALPRAZolam (0.5MG  Tablet, Oral) Active. Clopidogrel Bisulfate (75MG  Tablet, Oral) Active. Cyclobenzaprine HCl (10MG  Tablet, Oral) Active. Divalproex Sodium ER (500MG  Tablet ER 24HR, Oral) Active. Dyrenium (50MG  Capsule, Oral) Active. Furosemide (20MG  Tablet, Oral) Active. Latuda (40MG  Tablet, Oral) Active. Lisinopril (10MG  Tablet,  Oral) Active. OLANZapine (10MG  Tablet, Oral) Active. Simvastatin (20MG  Tablet, Oral) Active. Triamterene-HCTZ (37.5-25MG  Tablet, Oral) Active. Triamcinolone Acetonide (0.1% Cream, External) Active. Medications Reconciled  Social History Kathleen [name], [name]; 08/11/2016 8:45 AM) Alcohol use  Occasional alcohol use. Caffeine use  Carbonated beverages, Coffee. No drug use  Tobacco use  Never smoker.  Family History Kathleen [name], [name]; 08/11/2016 8:45 AM) Breast Cancer  Mother. Diabetes Mellitus  Family Members In General, Father. Migraine Headache  Daughter, Mother, Sister. Respiratory Condition  Mother. Thyroid problems  Brother, Mother, Family Members In [name], Sister.  Pregnancy / Birth History Kathleen [name], [name]; 08/11/2016 8:45 AM) Age at menarche  73 years. Age of menopause  51-55 Contraceptive History  Oral contraceptives. Gravida  3 Length (months) of breastfeeding  7-12 Maternal age  35-25 Para  67  Other Problems Kathleen [name], CMA; 08/11/2016 8:45 AM) Anxiety Disorder  Asthma  Chest pain  Congestive Heart Failure  High blood pressure  Hypercholesterolemia  Lump In Breast  Melanoma  Migraine Headache  Myocardial infarction  Other disease, cancer, significant illness  Pulmonary Embolism / Blood Clot in Legs  Thyroid Disease  Umbilical Hernia Repair     Review of Systems Kathleen [name] CMA; 08/11/2016 8:45 AM) General Present- Fatigue. Not Present- Appetite Loss, Chills, Fever, Night Sweats, Weight Gain and Weight Loss. Skin Present- Dryness. Not Present- Change in Wart/Mole, Hives, Jaundice, New Lesions, Non-Healing Wounds, Rash and Ulcer. HEENT Present- Wears glasses/contact lenses. Not Present- Earache, Hearing Loss, Hoarseness, Nose Bleed, Oral Ulcers, Ringing in the Ears, Seasonal Allergies, Sinus Pain, Sore Throat, Visual Disturbances and Yellow Eyes. Respiratory Present- Snoring. Not Present- Bloody sputum, Chronic  Cough, Difficulty Breathing and Wheezing. Breast Present- Breast Mass. Not Present- Breast Pain, Nipple Discharge and Skin Changes. Cardiovascular  Present- Swelling of Extremities. Not Present- Chest Pain, Difficulty Breathing Lying Down, Leg Cramps, Palpitations, Rapid Heart Rate and Shortness of Breath. Gastrointestinal Not Present- Abdominal Pain, Bloating, Bloody Stool, Change in Bowel Habits, Chronic diarrhea, Constipation, Difficulty Swallowing, Excessive gas, Gets full quickly at meals, Hemorrhoids, Indigestion, Nausea, Rectal Pain and Vomiting. Female Genitourinary Not Present- Frequency, Nocturia, Painful Urination, Pelvic Pain and Urgency. Musculoskeletal Not Present- Back Pain, Joint Pain, Joint Stiffness, Muscle Pain, Muscle Weakness and Swelling of Extremities. Neurological Present- Tremor. Not Present- Decreased Memory, Fainting, Headaches, Numbness, Seizures, Tingling, Trouble walking and Weakness. Psychiatric Present- Anxiety and Bipolar. Not Present- Change in Sleep Pattern, Depression, Fearful and Frequent crying. Endocrine Present- New Diabetes. Not Present- Cold Intolerance, Excessive Hunger, Hair Changes, Heat Intolerance and Hot flashes. Hematology Present- Blood Thinners. Not Present- Easy Bruising, Excessive bleeding, Gland problems, HIV and Persistent Infections.  Vitals Kathleen [name] CMA; 08/11/2016 8:47 AM) 08/11/2016 8:46 AM Weight: 337.4 lb Height: 71in Body Surface Area: 2.63 m Body Mass Index: 47.06 kg/m  Temp.: 98.77F  Pulse: 85 (Regular)  BP: 110/70 (Sitting, Left Arm, Standard)       Physical Exam Kathleen [name] T. Lanah Steines MD; 08/11/2016 9:43 AM) The physical exam findings are as follows: Note:General: Alert, morbidly obese Caucasian female, in no distress Skin: Warm and dry without rash or infection. Lymph nodes: No cervical, supraclavicular, nodes palpable. Breasts: I cannot feel any masses in either breast. No skin changes. Some inversion of  the left nipple which she reports just since the biopsy. No palpable axillary adenopathy. Lungs: Breath sounds clear and equal. No wheezing or increased work of breathing. Cardiovascular: Regular rate and rhythm without murmer. Extremities: Trace lower extremity edema. Chronic venous stasis changes bilaterally. Neurologic: Alert and fully oriented. Gait normal. No focal weakness. Psychiatric: Normal mood and affect. Thought content appropriate with normal judgement and insight    Assessment & Plan Kathleen [name] T. Meldrick Buttery MD; 08/11/2016 9:46 AM) PAPILLOMA OF LEFT BREAST (D24.2) Impression: 61 year old female with significant family history of breast cancer, recent MRI showing irregular mass in the lower inner left breast a large core needle biopsy revealing intraductal papilloma. I discussed the diagnosis and implications with the patient. We discussed that particularly in light of her family history and somewhat large mass on MRI that excision would be standard. I discussed options of close observation. She prefers to have it excised which I think is very reasonable. We discussed the indications and nature of the procedure, expected recovery, risks of anesthetic complications, cardiorespiratory complications, bleeding, infection. Discussed possible diagnoses. All her questions were answered. Current Plans I recommended obtaining preoperative cardiac clearance for recommendations on management of anticoagualtion perioperatively:  1. Timing of holding anticoagulation 2. Need for any bridge therapy (SQ enoxaparin, IV heparin, IV Aggrastat, etc) preop/postop. 3. Desired timing of resumption of anticoagulation.  In general from Dr. Johney Maine' standpoint:  Aspirin is okay to continue perioperatively (81mg  or 325mg ) & does not need to be held  Hold warfarin 5 dayspreoperatively. Consider PT/INR level on arrival to short stay the day of surgery. No need to check PT/INR level on preop  visit  Hold P2Y12 inibitors such as clopidrogel (Plavix) 4 dayspreoperatively  Hold direct thrombin / factor Xa inhibitors (Xerelto, Pradaxa, Eliquis, etc) 2 dayspreoperatively   Request clearance by cardiology to better assess operative risk & see if a reevaluation, further workup, etc is needed. Also recommendations on how medications such as for anticoagulation and blood pressure should be managed/held/restarted after surgery.  Radioactive seed localized left breast lumpectomy  under general anesthesia as an outpatient

## 2016-09-11 NOTE — Transfer of Care (Signed)
Immediate Anesthesia Transfer of Care Note  Patient: Kathleen [name]  Procedure(s) Performed: Procedure(s): LEFT BREAST LUMPECTOMY WITH RADIOACTIVE SEED LOCALIZATION (Left)  Patient Location: PACU  Anesthesia Type:General  Level of Consciousness: awake, alert , oriented and patient cooperative  Airway & Oxygen Therapy: Patient Spontanous Breathing and Patient connected to nasal cannula oxygen  Post-op Assessment: Report given to RN, Post -op Vital signs reviewed and stable and Patient moving all extremities X 4  Post vital signs: Reviewed and stable  Last Vitals:  Vitals:   09/11/16 1218 09/11/16 1723  BP: (!) 155/74   Pulse: 78   Resp: 20   Temp: 36.9 C (P) 36.6 C  SpO2: 96%     Last Pain:  Vitals:   09/11/16 1218  TempSrc: Oral      Patients Stated Pain Goal: 5 (62/95/28 4132)  Complications: No apparent anesthesia complications

## 2016-09-11 NOTE — Anesthesia Preprocedure Evaluation (Signed)
Anesthesia Evaluation  Patient identified by MRN, date of birth, ID band Patient awake    Reviewed: Allergy & Precautions, NPO status , Patient's Chart, lab work & pertinent test results  History of Anesthesia Complications (+) PONV  Airway Mallampati: II  TM Distance: >3 FB Neck ROM: Full    Dental   Pulmonary shortness of breath, asthma , sleep apnea , pneumonia,    breath sounds clear to auscultation       Cardiovascular hypertension, Pt. on medications + CAD, + Past MI, + Peripheral Vascular Disease and +CHF   Rhythm:Regular Rate:Normal     Neuro/Psych  Headaches, Anxiety Depression Bipolar Disorder    GI/Hepatic negative GI ROS, Neg liver ROS,   Endo/Other  diabetes, Type 2, Oral Hypoglycemic Agents  Renal/GU Renal disease     Musculoskeletal   Abdominal   Peds  Hematology   Anesthesia Other Findings   Reproductive/Obstetrics                             Anesthesia Physical  Anesthesia Plan  ASA: III  Anesthesia Plan: General   Post-op Pain Management:    Induction: Intravenous  PONV Risk Score and Plan: 3 and Ondansetron, Dexamethasone, Midazolam and Scopolamine patch - Pre-op  Airway Management Planned: Oral ETT  Additional Equipment:   Intra-op Plan:   Post-operative Plan: Possible Post-op intubation/ventilation  Informed Consent: I have reviewed the patients History and Physical, chart, labs and discussed the procedure including the risks, benefits and alternatives for the proposed anesthesia with the patient or authorized representative who has indicated his/her understanding and acceptance.   Dental advisory given  Plan Discussed with: CRNA and Anesthesiologist  Anesthesia Plan Comments:         Anesthesia Quick Evaluation

## 2016-09-12 ENCOUNTER — Encounter (HOSPITAL_COMMUNITY): Payer: Self-pay | Admitting: General Surgery

## 2016-09-14 NOTE — Patient Instructions (Addendum)
Your procedure is scheduled on:  Friday, Aug. 31, 2018  Enter through the [employer] [facility] at:  6:00 AM  Pick up the phone at the desk and dial [phone].  Call this number if you have problems the morning of surgery: [phone].  Remember: Do NOT eat food or drink after:  Midnight Thursday  Take these medicines the morning of surgery with a SIP OF WATER:   Xanax if needed  Stop taking Xarelto per doctors instructions  Do NOT take evening dose of Metformin  Bring Asthma Inhaler day of surgery  Stop ALL herbal medications at this time  Do NOT smoke the day of surgery.  Do NOT wear jewelry (body piercing), metal hair clips/bobby pins, make-up, artifical eyelashes or nail polish. Do NOT wear lotions, powders, or perfumes.  You may wear deodorant. Do NOT shave for 48 hours prior to surgery. Do NOT bring valuables to the hospital. Contacts, dentures, or bridgework may not be worn into surgery.  Leave suitcase in car.  After surgery it may be brought to your room.  For patients admitted to the hospital, checkout time is 11:00 AM the day of discharge.  Have a responsible adult drive you home and stay with you for 24 hours after your procedure  Bring a copy of your healthcare power of attorney and living will documents.

## 2016-09-15 ENCOUNTER — Encounter (HOSPITAL_COMMUNITY)
Admission: RE | Admit: 2016-09-15 | Discharge: 2016-09-15 | Disposition: A | Discharge status: Home / Self Care | Payer: BC Managed Care – PPO | Source: Ambulatory Visit | Attending: Obstetrics and Gynecology | Admitting: Obstetrics and Gynecology

## 2016-09-15 ENCOUNTER — Encounter (HOSPITAL_COMMUNITY): Payer: Self-pay

## 2016-09-15 DIAGNOSIS — Z01812 Encounter for preprocedural laboratory examination: Secondary | ICD-10-CM | POA: Diagnosis present

## 2016-09-15 HISTORY — DX: Deep phlebothrombosis in pregnancy, unspecified trimester: O22.30

## 2016-09-15 HISTORY — DX: Other specified forms of tremor: G25.2

## 2016-09-15 HISTORY — DX: Hypothyroidism, unspecified: E03.9

## 2016-09-15 HISTORY — DX: Other postprocedural complications of skin and subcutaneous tissue: L76.82

## 2016-09-15 LAB — CBC
HEMATOCRIT: 43.3 % (ref 36.0–46.0)
Hemoglobin: 14.7 g/dL (ref 12.0–15.0)
MCH: 29.3 pg (ref 26.0–34.0)
MCHC: 33.9 g/dL (ref 30.0–36.0)
MCV: 86.3 fL (ref 78.0–100.0)
Platelets: 138 10*3/uL — ABNORMAL LOW (ref 150–400)
RBC: 5.02 MIL/uL (ref 3.87–5.11)
RDW: 14.7 % (ref 11.5–15.5)
WBC: 7.9 10*3/uL (ref 4.0–10.5)

## 2016-09-15 NOTE — Pre-Procedure Instructions (Signed)
Discussed with Dr. Ola Spurr [name], no need for additional cardia clearance since she just had on for her procedure on 09/11/16.  Also only lab that he wants repeated is the CBC.

## 2016-09-21 NOTE — Anesthesia Preprocedure Evaluation (Signed)
Anesthesia Evaluation  Patient identified by MRN, date of birth, ID band Patient awake    Reviewed: Allergy & Precautions, NPO status , Patient's Chart, lab work & pertinent test results  History of Anesthesia Complications (+) PONV  Airway Mallampati: II  TM Distance: >3 FB Neck ROM: Full    Dental   Pulmonary shortness of breath, asthma , sleep apnea , pneumonia,    breath sounds clear to auscultation       Cardiovascular hypertension, Pt. on medications + CAD, + Past MI, + Peripheral Vascular Disease and +CHF   Rhythm:Regular Rate:Normal     Neuro/Psych  Headaches, Anxiety Depression Bipolar Disorder    GI/Hepatic negative GI ROS, Neg liver ROS,   Endo/Other  diabetes, Type 2, Oral Hypoglycemic Agents  Renal/GU Renal disease     Musculoskeletal   Abdominal   Peds  Hematology   Anesthesia Other Findings Echo 12/17  - Left ventricle: The cavity size was normal. Systolic function was   normal. The estimated ejection fraction was in the range of 55%   to 60%. Wall motion was normal; there were no regional wall   motion abnormalities. Doppler parameters are consistent with   abnormal left ventricular relaxation (grade 1 diastolic   dysfunction). - Aortic valve: Trileaflet; mildly thickened, mildly calcified   leaflets. - Left atrium: The atrium was mildly dilated. Anterior-posterior   dimension: 45 mm.  Reproductive/Obstetrics                            Anesthesia Physical  Anesthesia Plan  ASA: III  Anesthesia Plan: General   Post-op Pain Management:    Induction: Intravenous  PONV Risk Score and Plan: 3 and Ondansetron, Dexamethasone, Midazolam and Scopolamine patch - Pre-op  Airway Management Planned: Oral ETT and LMA  Additional Equipment:   Intra-op Plan:   Post-operative Plan:   Informed Consent: I have reviewed the patients History and Physical, chart, labs and  discussed the procedure including the risks, benefits and alternatives for the proposed anesthesia with the patient or authorized representative who has indicated his/her understanding and acceptance.   Dental advisory given  Plan Discussed with: CRNA and Anesthesiologist  Anesthesia Plan Comments:        Anesthesia Quick Evaluation

## 2016-09-22 ENCOUNTER — Ambulatory Visit (HOSPITAL_COMMUNITY): Payer: BC Managed Care – PPO | Admitting: Anesthesiology

## 2016-09-22 ENCOUNTER — Encounter (HOSPITAL_COMMUNITY): Payer: Self-pay

## 2016-09-22 ENCOUNTER — Ambulatory Visit (HOSPITAL_COMMUNITY)
Admission: RE | Admit: 2016-09-22 | Discharge: 2016-09-22 | Disposition: A | Discharge status: Home / Self Care | Payer: BC Managed Care – PPO | Source: Ambulatory Visit | Attending: Obstetrics and Gynecology | Admitting: Obstetrics and Gynecology

## 2016-09-22 ENCOUNTER — Encounter (HOSPITAL_COMMUNITY)
Admission: RE | Disposition: A | Discharge status: Home / Self Care | Payer: Self-pay | Source: Ambulatory Visit | Attending: Obstetrics and Gynecology

## 2016-09-22 DIAGNOSIS — F319 Bipolar disorder, unspecified: Secondary | ICD-10-CM | POA: Diagnosis not present

## 2016-09-22 DIAGNOSIS — J45909 Unspecified asthma, uncomplicated: Secondary | ICD-10-CM | POA: Diagnosis not present

## 2016-09-22 DIAGNOSIS — I110 Hypertensive heart disease with heart failure: Secondary | ICD-10-CM | POA: Insufficient documentation

## 2016-09-22 DIAGNOSIS — Z7901 Long term (current) use of anticoagulants: Secondary | ICD-10-CM | POA: Insufficient documentation

## 2016-09-22 DIAGNOSIS — I2510 Atherosclerotic heart disease of native coronary artery without angina pectoris: Secondary | ICD-10-CM | POA: Diagnosis not present

## 2016-09-22 DIAGNOSIS — I4891 Unspecified atrial fibrillation: Secondary | ICD-10-CM | POA: Insufficient documentation

## 2016-09-22 DIAGNOSIS — F419 Anxiety disorder, unspecified: Secondary | ICD-10-CM | POA: Diagnosis not present

## 2016-09-22 DIAGNOSIS — G4730 Sleep apnea, unspecified: Secondary | ICD-10-CM | POA: Diagnosis not present

## 2016-09-22 DIAGNOSIS — Z888 Allergy status to other drugs, medicaments and biological substances status: Secondary | ICD-10-CM | POA: Diagnosis not present

## 2016-09-22 DIAGNOSIS — I509 Heart failure, unspecified: Secondary | ICD-10-CM | POA: Diagnosis not present

## 2016-09-22 DIAGNOSIS — I252 Old myocardial infarction: Secondary | ICD-10-CM | POA: Insufficient documentation

## 2016-09-22 DIAGNOSIS — N840 Polyp of corpus uteri: Secondary | ICD-10-CM | POA: Insufficient documentation

## 2016-09-22 DIAGNOSIS — Z79899 Other long term (current) drug therapy: Secondary | ICD-10-CM | POA: Insufficient documentation

## 2016-09-22 DIAGNOSIS — Z91012 Allergy to eggs: Secondary | ICD-10-CM | POA: Insufficient documentation

## 2016-09-22 DIAGNOSIS — E1151 Type 2 diabetes mellitus with diabetic peripheral angiopathy without gangrene: Secondary | ICD-10-CM | POA: Diagnosis not present

## 2016-09-22 DIAGNOSIS — Z7984 Long term (current) use of oral hypoglycemic drugs: Secondary | ICD-10-CM | POA: Diagnosis not present

## 2016-09-22 DIAGNOSIS — I350 Nonrheumatic aortic (valve) stenosis: Secondary | ICD-10-CM | POA: Diagnosis not present

## 2016-09-22 DIAGNOSIS — N950 Postmenopausal bleeding: Secondary | ICD-10-CM | POA: Insufficient documentation

## 2016-09-22 HISTORY — PX: DILATATION & CURETTAGE/HYSTEROSCOPY WITH MYOSURE: SHX6511

## 2016-09-22 LAB — GLUCOSE, CAPILLARY
Glucose-Capillary: 137 mg/dL — ABNORMAL HIGH (ref 65–99)
Glucose-Capillary: 120 mg/dL — ABNORMAL HIGH (ref 65–99)

## 2016-09-22 SURGERY — DILATATION & CURETTAGE/HYSTEROSCOPY WITH MYOSURE
Anesthesia: General | Site: Vagina

## 2016-09-22 MED ORDER — SCOPOLAMINE 1 MG/3DAYS TD PT72
1.0000 | MEDICATED_PATCH | Freq: Once | TRANSDERMAL | Status: DC
  Administered 2016-09-22: 1.5 mg via TRANSDERMAL

## 2016-09-22 MED ORDER — MIDAZOLAM HCL 2 MG/2ML IJ SOLN
INTRAMUSCULAR | Status: AC
  Filled 2016-09-22: qty 2

## 2016-09-22 MED ORDER — DEXAMETHASONE SODIUM PHOSPHATE 4 MG/ML IJ SOLN
INTRAMUSCULAR | Status: DC | PRN
  Administered 2016-09-22: 4 mg via INTRAVENOUS

## 2016-09-22 MED ORDER — CEFOTETAN DISODIUM-DEXTROSE 2-2.08 GM-% IV SOLR
INTRAVENOUS | Status: AC
  Filled 2016-09-22: qty 50

## 2016-09-22 MED ORDER — ONDANSETRON HCL 4 MG/2ML IJ SOLN
INTRAMUSCULAR | Status: AC
  Filled 2016-09-22: qty 2

## 2016-09-22 MED ORDER — SODIUM CHLORIDE 0.9 % IR SOLN
Status: DC | PRN
  Administered 2016-09-22: 3000 mL

## 2016-09-22 MED ORDER — PROPOFOL 10 MG/ML IV BOLUS
INTRAVENOUS | Status: DC | PRN
  Administered 2016-09-22: 150 mg via INTRAVENOUS

## 2016-09-22 MED ORDER — LIDOCAINE HCL (CARDIAC) 20 MG/ML IV SOLN
INTRAVENOUS | Status: AC
  Filled 2016-09-22: qty 5

## 2016-09-22 MED ORDER — KETOROLAC TROMETHAMINE 30 MG/ML IJ SOLN
INTRAMUSCULAR | Status: AC
  Filled 2016-09-22: qty 1

## 2016-09-22 MED ORDER — PROPOFOL 10 MG/ML IV BOLUS
INTRAVENOUS | Status: AC
  Filled 2016-09-22: qty 20

## 2016-09-22 MED ORDER — MIDAZOLAM HCL 2 MG/2ML IJ SOLN
INTRAMUSCULAR | Status: DC | PRN
  Administered 2016-09-22: 1 mg via INTRAVENOUS

## 2016-09-22 MED ORDER — CEFOTETAN DISODIUM-DEXTROSE 2-2.08 GM-% IV SOLR
2.0000 g | INTRAVENOUS | Status: AC
  Administered 2016-09-22: 2 g via INTRAVENOUS

## 2016-09-22 MED ORDER — LIDOCAINE HCL 1 % IJ SOLN
INTRAMUSCULAR | Status: AC
  Filled 2016-09-22: qty 20

## 2016-09-22 MED ORDER — ONDANSETRON HCL 4 MG/2ML IJ SOLN
INTRAMUSCULAR | Status: DC | PRN
Start: 2016-09-22 — End: 2016-09-22
  Administered 2016-09-22: 4 mg via INTRAVENOUS

## 2016-09-22 MED ORDER — DEXAMETHASONE SODIUM PHOSPHATE 4 MG/ML IJ SOLN
INTRAMUSCULAR | Status: AC
  Filled 2016-09-22: qty 1

## 2016-09-22 MED ORDER — FENTANYL CITRATE (PF) 100 MCG/2ML IJ SOLN
INTRAMUSCULAR | Status: AC
  Administered 2016-09-22: 50 ug via INTRAVENOUS
  Filled 2016-09-22: qty 2

## 2016-09-22 MED ORDER — LIDOCAINE HCL (CARDIAC) 20 MG/ML IV SOLN
INTRAVENOUS | Status: DC | PRN
  Administered 2016-09-22: 60 mg via INTRAVENOUS

## 2016-09-22 MED ORDER — LACTATED RINGERS IV SOLN
INTRAVENOUS | Status: DC
  Administered 2016-09-22 (×2): via INTRAVENOUS

## 2016-09-22 MED ORDER — LIDOCAINE HCL 1 % IJ SOLN
INTRAMUSCULAR | Status: DC | PRN
  Administered 2016-09-22: 1 mL

## 2016-09-22 MED ORDER — SCOPOLAMINE 1 MG/3DAYS TD PT72
MEDICATED_PATCH | TRANSDERMAL | Status: DC
Start: 2016-09-22 — End: 2016-09-22
  Administered 2016-09-22: 1.5 mg via TRANSDERMAL
  Filled 2016-09-22: qty 1

## 2016-09-22 MED ORDER — FENTANYL CITRATE (PF) 100 MCG/2ML IJ SOLN
INTRAMUSCULAR | Status: DC | PRN
  Administered 2016-09-22: 25 ug via INTRAVENOUS
  Administered 2016-09-22 (×2): 50 ug via INTRAVENOUS

## 2016-09-22 MED ORDER — ACETAMINOPHEN 10 MG/ML IV SOLN
INTRAVENOUS | Status: AC
  Filled 2016-09-22: qty 100

## 2016-09-22 MED ORDER — MEPERIDINE HCL 25 MG/ML IJ SOLN: 6.2500 mg | INTRAMUSCULAR | Status: DC | PRN

## 2016-09-22 MED ORDER — ONDANSETRON HCL 4 MG/2ML IJ SOLN: 4.0000 mg | Freq: Once | INTRAMUSCULAR | Status: DC | PRN

## 2016-09-22 MED ORDER — ACETAMINOPHEN 10 MG/ML IV SOLN
1000.0000 mg | Freq: Once | INTRAVENOUS | Status: AC
  Administered 2016-09-22: 1000 mg via INTRAVENOUS
  Filled 2016-09-22: qty 100

## 2016-09-22 MED ORDER — FENTANYL CITRATE (PF) 100 MCG/2ML IJ SOLN
25.0000 ug | INTRAMUSCULAR | Status: DC | PRN
  Administered 2016-09-22: 50 ug via INTRAVENOUS

## 2016-09-22 MED ORDER — FENTANYL CITRATE (PF) 250 MCG/5ML IJ SOLN
INTRAMUSCULAR | Status: AC
  Filled 2016-09-22: qty 5

## 2016-09-22 SURGICAL SUPPLY — 16 items
CATH ROBINSON RED A/P 16FR (CATHETERS) ×2 IMPLANT
CLOTH BEACON ORANGE TIMEOUT ST (SAFETY) ×2 IMPLANT
CONTAINER PREFILL 10% NBF 60ML (FORM) ×4 IMPLANT
DEVICE MYOSURE LITE (MISCELLANEOUS) ×1 IMPLANT
DEVICE MYOSURE REACH (MISCELLANEOUS) IMPLANT
FILTER ARTHROSCOPY CONVERTOR (FILTER) ×2 IMPLANT
GLOVE BIO SURGEON STRL SZ7 (GLOVE) ×4 IMPLANT
GLOVE BIOGEL PI IND STRL 7.0 (GLOVE) ×1 IMPLANT
GLOVE BIOGEL PI INDICATOR 7.0 (GLOVE) ×1
GOWN STRL REUS W/TWL LRG LVL3 (GOWN DISPOSABLE) ×4 IMPLANT
PACK VAGINAL MINOR WOMEN LF (CUSTOM PROCEDURE TRAY) ×2 IMPLANT
PAD OB MATERNITY 4.3X12.25 (PERSONAL CARE ITEMS) ×2 IMPLANT
SEAL ROD LENS SCOPE MYOSURE (ABLATOR) ×2 IMPLANT
TOWEL OR 17X24 6PK STRL BLUE (TOWEL DISPOSABLE) ×4 IMPLANT
TUBING AQUILEX INFLOW (TUBING) ×2 IMPLANT
TUBING AQUILEX OUTFLOW (TUBING) ×2 IMPLANT

## 2016-09-22 NOTE — Anesthesia Postprocedure Evaluation (Signed)
Anesthesia Post Note  Patient: Kathleen [name]  Procedure(s) Performed: Procedure(s) (LRB): DILATATION & CURETTAGE/HYSTEROSCOPY WITH MYOSURE (N/A)     Patient location during evaluation: PACU Anesthesia Type: General Level of consciousness: awake and alert Pain management: pain level controlled Vital Signs Assessment: post-procedure vital signs reviewed and stable Respiratory status: spontaneous breathing, nonlabored ventilation, respiratory function stable and patient connected to nasal cannula oxygen Cardiovascular status: blood pressure returned to baseline and stable Postop Assessment: no signs of nausea or vomiting Anesthetic complications: no    Last Vitals:  Vitals:   09/22/16 0915 09/22/16 0930  BP: 140/81 (!) 144/87  Pulse: 73 76  Resp: 12 14  Temp:    SpO2: 95% 96%    Last Pain:  Vitals:   09/22/16 0845  TempSrc:   PainSc: 5    Pain Goal: Patients Stated Pain Goal: 3 (09/22/16 0603)               Abenezer Odonell

## 2016-09-22 NOTE — Anesthesia Procedure Notes (Signed)
Procedure Name: LMA Insertion Date/Time: 09/22/2016 7:32 AM Performed by: Elenore Paddy Pre-anesthesia Checklist: Patient identified, Emergency Drugs available, Suction available and Patient being monitored Patient Re-evaluated:Patient Re-evaluated prior to induction Oxygen Delivery Method: Circle system utilized Preoxygenation: Pre-oxygenation with 100% oxygen Induction Type: IV induction LMA: LMA with gastric port inserted LMA Size: 4.0 Number of attempts: 1 Dental Injury: Teeth and Oropharynx as per pre-operative assessment

## 2016-09-22 NOTE — Discharge Instructions (Signed)

## 2016-09-22 NOTE — Transfer of Care (Signed)
Immediate Anesthesia Transfer of Care Note  Patient: MARSHAWN NINNEMAN  Procedure(s) Performed: Procedure(s): DILATATION & CURETTAGE/HYSTEROSCOPY WITH MYOSURE (N/A)  Patient Location: PACU  Anesthesia Type:General  Level of Consciousness: awake, alert  and oriented  Airway & Oxygen Therapy: Patient Spontanous Breathing and Patient connected to nasal cannula oxygen  Post-op Assessment: Report given to RN and Post -op Vital signs reviewed and stable  Post vital signs: Reviewed and stable BP 158/100, HR 68, RR 12, SaO2 96%  Last Vitals:  Vitals:   09/22/16 0603  BP: (!) 147/87  Pulse: 69  Resp: (!) 22  Temp: 36.4 C  SpO2: 96%    Last Pain:  Vitals:   09/22/16 0603  TempSrc: Oral      Patients Stated Pain Goal: 3 (36/62/94 7654)  Complications: No apparent anesthesia complications

## 2016-09-22 NOTE — Progress Notes (Signed)
The patient was re-examined with no change in status, per protocol, OFF Xarelto X 2 days

## 2016-09-22 NOTE — Op Note (Signed)
Preoperative diagnosis: Impulse bleeding, endometrial polyp  Postoperative diagnosis: Same  Procedure: Hysteroscopy, resection of polyp with mild Essure  Surgeon: Matthew Saras  Anesthesia: Gen.  EBL: Less than 5 cc  Procedure and findings:  Patient taken the operating room after an adequate level of general anesthesia was obtained the patient's legs in stirrups, the perineum and vagina were prepped and draped the bladder was drained appropriate timeouts were taken at that point. Speculum was positioned, the anterior cervical lip was infiltrated with Xylocaine 1% tenaculum was used to steady the cervix, sounded to 8 cm progressively dilated to 27 Pratt, continuous flow hysteroscope was inserted in the right cornual area well-defined polyp was noted, the resector was inserted then resected easily and completely there was minimal bleeding remainder of the cavity appeared to be normal she tolerated this well went to recovery room in good condition.  Dictated with CecilD.

## 2016-09-23 ENCOUNTER — Encounter (HOSPITAL_COMMUNITY): Payer: Self-pay | Admitting: Obstetrics and Gynecology

## 2016-10-14 ENCOUNTER — Other Ambulatory Visit (HOSPITAL_COMMUNITY): Payer: Self-pay | Admitting: Student

## 2016-10-23 ENCOUNTER — Other Ambulatory Visit: Payer: Self-pay | Admitting: Cardiology

## 2016-10-24 ENCOUNTER — Other Ambulatory Visit: Payer: Self-pay | Admitting: Family Medicine

## 2016-11-07 ENCOUNTER — Other Ambulatory Visit: Payer: Self-pay | Admitting: Family Medicine

## 2016-11-19 ENCOUNTER — Other Ambulatory Visit: Payer: Self-pay | Admitting: Cardiology

## 2016-11-20 ENCOUNTER — Encounter: Payer: Self-pay | Admitting: Family Medicine

## 2016-11-20 ENCOUNTER — Telehealth: Payer: Self-pay | Admitting: Family Medicine

## 2016-11-20 ENCOUNTER — Ambulatory Visit (INDEPENDENT_AMBULATORY_CARE_PROVIDER_SITE_OTHER): Payer: BC Managed Care – PPO | Admitting: Family Medicine

## 2016-11-20 ENCOUNTER — Encounter: Payer: Self-pay | Admitting: Neurology

## 2016-11-20 VITALS — BP 122/84 | HR 67 | Temp 98.1°F | Wt 337.5 lb

## 2016-11-20 DIAGNOSIS — Z23 Encounter for immunization: Secondary | ICD-10-CM | POA: Diagnosis not present

## 2016-11-20 DIAGNOSIS — I10 Essential (primary) hypertension: Secondary | ICD-10-CM

## 2016-11-20 DIAGNOSIS — R251 Tremor, unspecified: Secondary | ICD-10-CM

## 2016-11-20 DIAGNOSIS — E119 Type 2 diabetes mellitus without complications: Secondary | ICD-10-CM | POA: Diagnosis not present

## 2016-11-20 LAB — HEMOGLOBIN A1C: HEMOGLOBIN A1C: 6.3 % (ref 4.6–6.5)

## 2016-11-20 LAB — BASIC METABOLIC PANEL
BUN: 12 mg/dL (ref 6–23)
CHLORIDE: 103 meq/L (ref 96–112)
CO2: 30 mEq/L (ref 19–32)
Calcium: 9.1 mg/dL (ref 8.4–10.5)
Creatinine, Ser: 0.8 mg/dL (ref 0.40–1.20)
GFR: 77.46 mL/min (ref >60.00)
Glucose, Bld: 137 mg/dL — ABNORMAL HIGH (ref 70–99)
POTASSIUM: 4.6 meq/L (ref 3.5–5.1)
SODIUM: 144 meq/L (ref 135–145)

## 2016-11-20 LAB — TSH: TSH: 3.16 u[IU]/mL (ref 0.35–4.50)

## 2016-11-20 NOTE — Assessment & Plan Note (Signed)
Chronic problem.  Tolerating Metformin w/o difficulty.  UTD on eye exam.  Foot exam done today.  On ACE for renal protection.  Stressed need for healthy diet and regular exercise.  If A1C has not improved, will refer for Nutrition.  Check labs.  Adjust meds prn

## 2016-11-20 NOTE — Progress Notes (Signed)
   Subjective:    Patient ID: Kathleen [name], female    DOB: [birth year], 61 y.o.   MRN: [id number]  HPI DM- chronic problem, on Metformin 500mg  BID.  On ACE for renal protection.  UTD on eye exam, foot exam.  No regular exercise.  Trying to limit her carb intake.  Pt reports if A1C is high, she wants to see Dietician.  Last A1C 8.2.  Denies symptomatic lows.  No numbness or tingling of hands/feet  HTN- chronic problem.  BP is elevated today.  On Lisinopril 10mg  daily, Lasix 80mg  daily, Metoprolol 50mg  daily.  No CP, SOB, HAs, visual changes, edema.  Bilateral hand tremor- 'relatively new'.  Has been worsening over the last year.  Most noticeable w/ eating or writing.  Pt now holds her hands at rest to prevent from shaking.  Interested in seeing Neurology.  Interested in flu shot today.   Review of Systems For ROS see HPI     Objective:   Physical Exam  Constitutional: She is oriented to person, place, and time. She appears well-developed and well-nourished. No distress.  obese  HENT:  Head: Normocephalic and atraumatic.  Eyes: Pupils are equal, round, and reactive to light. Conjunctivae and EOM are normal.  Neck: Normal range of motion. Neck supple. No thyromegaly present.  Cardiovascular: Normal rate, regular rhythm, normal heart sounds and intact distal pulses.   No murmur heard. Pulmonary/Chest: Effort normal and breath sounds normal. No respiratory distress.  Abdominal: Soft. She exhibits no distension. There is no tenderness.  Musculoskeletal: She exhibits edema (trace edema bilaterally w/ chronic venous stasis changes).  Lymphadenopathy:    She has no cervical adenopathy.  Neurological: She is alert and oriented to person, place, and time.  Bilateral hand tremor, R>L  Skin: Skin is warm and dry.  Psychiatric: She has a normal mood and affect. Her behavior is normal.  Vitals reviewed.         Assessment & Plan:

## 2016-11-20 NOTE — Assessment & Plan Note (Signed)
New.  Unclear whether this is due to her psych meds, her hyperparathyroidism, or other cause.  As this is new and more noticeable than previously, will refer to Neuro for complete evaluation.  Pt expressed understanding and is in agreement w/ plan.

## 2016-11-20 NOTE — Patient Instructions (Signed)
Follow up in 3-4 months to recheck diabetes We'll notify you of your lab results and make any changes if needed We'll call you with your Neuro appt for the tremor Continue to work on healthy diet and regular exercise- you can do it!! Call with any questions or concerns Happy Fall!!!

## 2016-11-20 NOTE — Telephone Encounter (Signed)
REFILL 

## 2016-11-20 NOTE — Assessment & Plan Note (Signed)
Chronic problem.  BP was initially elevated but this came down as visit progressed.  Asymptomatic at this time.  Check labs.  No anticipated med changes.  Will continue to follow closely.

## 2016-11-20 NOTE — Telephone Encounter (Signed)
Patient states she was seen in office this morning and was instructed by pcp to take otc vitamin D.  She thought the instructions were going to be put on her AVS.  However, they are not.  She needs to know how much otc vitamin D she needs to be taking.

## 2016-11-20 NOTE — Addendum Note (Signed)
Addended by: Johna Sheriff on: 11/20/2016 09:19 AM   Modules accepted: Orders

## 2016-11-21 NOTE — Telephone Encounter (Signed)
Please advise, I do not see any mention of this in her note from yesterday.

## 2016-11-21 NOTE — Telephone Encounter (Signed)
5,000 units once daily

## 2016-11-21 NOTE — Telephone Encounter (Signed)
Patient notified of PCP recommendations and is agreement and expresses an understanding.  

## 2016-11-25 ENCOUNTER — Other Ambulatory Visit: Payer: Self-pay | Admitting: Family Medicine

## 2016-12-05 ENCOUNTER — Ambulatory Visit: Payer: BC Managed Care – PPO | Admitting: Neurology

## 2016-12-11 ENCOUNTER — Emergency Department (HOSPITAL_BASED_OUTPATIENT_CLINIC_OR_DEPARTMENT_OTHER): Payer: BC Managed Care – PPO

## 2016-12-11 ENCOUNTER — Encounter (HOSPITAL_BASED_OUTPATIENT_CLINIC_OR_DEPARTMENT_OTHER): Payer: Self-pay | Admitting: *Deleted

## 2016-12-11 ENCOUNTER — Encounter: Payer: Self-pay | Admitting: Family Medicine

## 2016-12-11 ENCOUNTER — Emergency Department (HOSPITAL_BASED_OUTPATIENT_CLINIC_OR_DEPARTMENT_OTHER)
Admission: EM | Admit: 2016-12-11 | Discharge: 2016-12-11 | Disposition: A | Discharge status: Home / Self Care | Payer: BC Managed Care – PPO | Attending: Emergency Medicine | Admitting: Emergency Medicine

## 2016-12-11 ENCOUNTER — Other Ambulatory Visit: Payer: Self-pay

## 2016-12-11 ENCOUNTER — Ambulatory Visit: Payer: Self-pay | Admitting: *Deleted

## 2016-12-11 DIAGNOSIS — I5032 Chronic diastolic (congestive) heart failure: Secondary | ICD-10-CM | POA: Insufficient documentation

## 2016-12-11 DIAGNOSIS — I2510 Atherosclerotic heart disease of native coronary artery without angina pectoris: Secondary | ICD-10-CM | POA: Diagnosis not present

## 2016-12-11 DIAGNOSIS — Z7984 Long term (current) use of oral hypoglycemic drugs: Secondary | ICD-10-CM | POA: Insufficient documentation

## 2016-12-11 DIAGNOSIS — E119 Type 2 diabetes mellitus without complications: Secondary | ICD-10-CM | POA: Insufficient documentation

## 2016-12-11 DIAGNOSIS — Z7901 Long term (current) use of anticoagulants: Secondary | ICD-10-CM | POA: Insufficient documentation

## 2016-12-11 DIAGNOSIS — E213 Hyperparathyroidism, unspecified: Secondary | ICD-10-CM | POA: Insufficient documentation

## 2016-12-11 DIAGNOSIS — I110 Hypertensive heart disease with heart failure: Secondary | ICD-10-CM | POA: Insufficient documentation

## 2016-12-11 DIAGNOSIS — R4182 Altered mental status, unspecified: Secondary | ICD-10-CM | POA: Diagnosis not present

## 2016-12-11 DIAGNOSIS — Z85828 Personal history of other malignant neoplasm of skin: Secondary | ICD-10-CM | POA: Diagnosis not present

## 2016-12-11 DIAGNOSIS — R05 Cough: Secondary | ICD-10-CM

## 2016-12-11 DIAGNOSIS — R059 Cough, unspecified: Secondary | ICD-10-CM

## 2016-12-11 DIAGNOSIS — Z79899 Other long term (current) drug therapy: Secondary | ICD-10-CM | POA: Diagnosis not present

## 2016-12-11 DIAGNOSIS — J069 Acute upper respiratory infection, unspecified: Secondary | ICD-10-CM | POA: Diagnosis not present

## 2016-12-11 DIAGNOSIS — J00 Acute nasopharyngitis [common cold]: Secondary | ICD-10-CM

## 2016-12-11 LAB — BASIC METABOLIC PANEL
ANION GAP: 10 (ref 5–15)
BUN: 14 mg/dL (ref 6–20)
CHLORIDE: 103 mmol/L (ref 101–111)
CO2: 26 mmol/L (ref 22–32)
Calcium: 9.1 mg/dL (ref 8.9–10.3)
Creatinine, Ser: 0.72 mg/dL (ref 0.44–1.00)
GFR calc non Af Amer: >60 mL/min (ref >60)
GLUCOSE: 104 mg/dL — AB (ref 65–99)
Potassium: 3.5 mmol/L (ref 3.5–5.1)
Sodium: 139 mmol/L (ref 135–145)

## 2016-12-11 LAB — CBC WITH DIFFERENTIAL/PLATELET
BASOS ABS: 0 10*3/uL (ref 0.0–0.1)
BASOS PCT: 0 %
Eosinophils Absolute: 0.1 10*3/uL (ref 0.0–0.7)
Eosinophils Relative: 1 %
HEMATOCRIT: 45.5 % (ref 36.0–46.0)
HEMOGLOBIN: 15.5 g/dL — AB (ref 12.0–15.0)
LYMPHS PCT: 16 %
Lymphs Abs: 1.1 10*3/uL (ref 0.7–4.0)
MCH: 29 pg (ref 26.0–34.0)
MCHC: 34.1 g/dL (ref 30.0–36.0)
MCV: 85.2 fL (ref 78.0–100.0)
MONOS PCT: 8 %
Monocytes Absolute: 0.5 10*3/uL (ref 0.1–1.0)
NEUTROS ABS: 5.2 10*3/uL (ref 1.7–7.7)
NEUTROS PCT: 75 %
Platelets: 130 10*3/uL — ABNORMAL LOW (ref 150–400)
RBC: 5.34 MIL/uL — ABNORMAL HIGH (ref 3.87–5.11)
RDW: 14.5 % (ref 11.5–15.5)
WBC: 6.9 10*3/uL (ref 4.0–10.5)

## 2016-12-11 MED ORDER — ACETAMINOPHEN 500 MG PO TABS
1000.0000 mg | ORAL_TABLET | Freq: Once | ORAL | Status: AC
  Administered 2016-12-11: 1000 mg via ORAL
  Filled 2016-12-11: qty 2

## 2016-12-11 NOTE — ED Triage Notes (Signed)
Cough and cold symptoms x 3 days. She is ambulatory and does not appear to be in any distress. She drove herself here. She called her MD and was told to come here. She has palpitations and SOB x 2 weeks. She has been having difficulty understanding what people on the TV are saying. This started 3 days ago. She has an appointment next week for tremors.

## 2016-12-11 NOTE — Telephone Encounter (Addendum)
Called patient to follow up.  She reports daily episodes of not being able to understand the TV since Friday. Each episode lasts about 2 minutes about resolves spontaneously.  On Friday, she had some dizziness that has since resolved. Yesterday she had a headache all day. She does endorse a "bad cold" since Friday and has also had SOB on exertion and palpitations "all the time" x2 weeks. Reports now she gets out of breath just from turning over in bed. She is oriented to person, place, and time. Normal speech pattern. She denies chest pain and loss of consciousness. Given patient's transient AMS, palpitations, SOB and multiple comorbidities (DM, h/o MI, HTN, CAD), I did not feel waiting 2 days for appt was advisable and recommended pt be seen in ED today to r/o acute process. Patient agreed to proceed to [place] ED.
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
## 2016-12-11 NOTE — ED Provider Notes (Signed)
[facility] EMERGENCY DEPARTMENT Provider Note  CSN: [id number] Arrival date & time: 12/11/16 1605  Chief Complaint(s) Cough  HPI Kathleen [name] is a 61 y.o. female   HPI  Who presents to the emergency with 2-3 days of runny nose, cough, congestion.  Denies any fevers, chills, productive cough, chest pain, shortness of breath, nausea, vomiting, abdominal pain, diarrhea.  Patient does not endorse some intermittent neurologic symptoms, stating that she has episodes where she can't understand what TV show was saying for 2-3 mins.  This has happened several times over the weekend.  She had one episode of dizziness/lightheadedness 3 days ago that has not recurred.  She is endorsing bitemporal headache, but no visual changes, no difficulty with speech or swallowing, focal weakness.   No new meds. Stopped latuda 3 weeks ago.  Past Medical History Past Medical History:  Diagnosis Date  . Anemia   . Anxiety   . Asthma   . Atrial fibrillation ([place])   . Bipolar disorder ([place])   . Bronchitis    hx of   . Cancer ([place])    basal cell carcinoma on right hand, papilloma left breast  . CHF (congestive heart failure) ([place])    pt seen at heart/vascular spec clinic 08/14/2014   . Coarse tremors    hands  . Coronary artery disease   . Depression   . DVT (deep vein thrombosis) in pregnancy (HCC)    leg  . Dyslipidemia   . Dysrhythmia    A fib  . Fall   . Fatty liver   . Headache    migraines - stopped after menopause  . Heart attack ([place]) 02/2007   non-q-wave with second septal perforator  . Hyperlipidemia   . Hypertension   . Hypothyroidism    history of took medication, has resolved  . Knee pain, left   . Lower extremity deep venous thrombosis (HCC)    20 years ago   . Measles    hx of in childhood   . Morbid obesity with BMI of 40.0-44.9, adult ([place])   . Pain at surgical incision    Left breast from procedure 09/11/16  . Pneumonia    hx of   . PONV (postoperative nausea  and vomiting)    also slow to wake up  . Psychiatric hospitalization 05/2008  . Shingles    20 years ago   . Shortness of breath dyspnea    exercise   . Thyroid disease   . Type 2 diabetes mellitus ([place]) 12/31/2015  . Urinary incontinence   . Urinary tract bacterial infections    Patient Active Problem List   Diagnosis Date Noted  . Tremor of both hands 11/20/2016  . Gout 06/29/2016  . Type 2 diabetes mellitus ([place]) 12/31/2015  . Chest pain with moderate risk for cardiac etiology 12/29/2015  . Coronary artery dissection   . Pure hypercholesterolemia   . Pain in the chest   . Exertional shortness of breath   . Chest pain, negative MI and negative Nuc stress test, maybe GI 08/04/2015  . Injury of right hand 09/24/2014  . Lung nodule seen on imaging study 09/24/2014  . Paroxysmal atrial fibrillation ([place]) 09/11/2014  . Preoperative cardiovascular examination 08/22/2014  . Asthmatic bronchitis with exacerbation 05/25/2014  . Primary hyperparathyroidism ([place]) 04/15/2014  . Palpitations 03/20/2014  . Radicular low back pain 11/24/2013  . Chronic diastolic heart failure ([place]) 10/02/2013  . Shortness of breath 08/31/2013  . Venous stasis dermatitis of  both lower extremities 08/19/2013  . Peripheral edema 06/12/2013  . Other malaise and fatigue 06/12/2013  . Polyuria 06/12/2013  . Nocturia 06/12/2013  . Pyelonephritis 05/12/2013  . Cellulitis of leg, right 10/11/2012  . Morbid obesity ([place]) 08/09/2012  . Urinary incontinence, urge 07/11/2012  . Chest pain, mid sternal 12/07/2011  . RUQ abdominal pain 07/24/2011  . Fungal infection of nail 02/15/2011  . General medical examination 02/15/2011  . CAD (coronary artery disease) 10/31/2010  . Gantt INJURY 03/29/2010  . NEOPLASM OF UNCERTAIN BEHAVIOR OF SKIN 02/11/2010  . Bilateral anterior knee pain 01/05/2010  . MUSCLE SPASM, TRAPEZIUS 01/05/2010  . Bipolar disorder ([place]) 09/14/2009  . Acute sinusitis 12/16/2008  . ACUT MYOCARD  INFARCT UNS SITE SUBSQT EPIS CARE 07/02/2008  . EDEMA 06/23/2008  . Hyperlipidemia 05/23/2008  . UNSPECIFIED DISORDER OF THYROID 02/04/2008  . Obesity 02/04/2008  . HYPERTENSION, BENIGN ESSENTIAL 02/04/2008  . MYOCARDIAL INFARCTION, HX OF 03/05/2007   Home Medication(s) Prior to Admission medications   Medication Sig Start Date End Date Taking? Authorizing Provider  albuterol (PROVENTIL HFA;VENTOLIN HFA) 108 (90 BASE) MCG/ACT inhaler Inhale 2 puffs into the lungs every 6 (six) hours as needed for wheezing. 11/12/12   Ann Held, DO  allopurinol (ZYLOPRIM) 100 MG tablet Take 2 tablets (200 mg total) by mouth daily. Patient taking differently: Take 100 mg by mouth 2 (two) times daily.  07/17/16   Midge Minium, MD  ALPRAZolam Duanne Moron) 0.5 MG tablet Take 0.5 mg by mouth 2 (two) times daily as needed for anxiety.    [provider]  benztropine (COGENTIN) 1 MG tablet Take 1 mg by mouth 2 (two) times daily. 03/30/16   [provider]  divalproex (DEPAKOTE ER) 500 MG 24 hr tablet Take 1,500 mg by mouth at bedtime.     [provider]  furosemide (LASIX) 80 MG tablet Take 1 tablet (80 mg total) by mouth daily. NEED OV. 11/20/16   Minus Breeding, MD  LATUDA 60 MG TABS Take 60 mg by mouth daily with supper.  03/08/16   [provider]  lisinopril (PRINIVIL,ZESTRIL) 10 MG tablet TAKE 1 TABLET (10 MG TOTAL) BY MOUTH DAILY. Patient taking differently: Take 10 mg by mouth every evening.  02/04/16   Clegg, [name] D, NP  metFORMIN (GLUCOPHAGE) 500 MG tablet TAKE 1 TABLET (500 MG TOTAL) BY MOUTH 2 (TWO) TIMES DAILY WITH A MEAL. 11/27/16   Midge Minium, MD  metoprolol succinate (TOPROL-XL) 50 MG 24 hr tablet TAKE 1 TABLET BY MOUTH DAILY WITH OR IMMEDIATELY FOLLOWING A MEAL 04/10/16   Bensimhon, Shaune Pascal, MD  nitroGLYCERIN (NITROSTAT) 0.4 MG SL tablet Place 1 tablet (0.4 mg total) under the tongue every 5 (five) minutes as needed for chest pain. For chest pain  12/07/11   Barrett, Evelene Croon, PA-C  OLANZapine (ZYPREXA) 5 MG tablet Take 5 mg by mouth at bedtime.    [provider]  Polyethyl Glycol-Propyl Glycol (SYSTANE) 0.4-0.3 % SOLN Place 1-2 drops into both eyes 3 (three) times daily as needed (for dry/irritated eyes.).    [provider]  rosuvastatin (CRESTOR) 20 MG tablet Take 1 tablet (20 mg total) by mouth daily. Patient taking differently: Take 20 mg by mouth every evening.  08/04/16   Midge Minium, MD  triamcinolone ointment (KENALOG) 0.1 % APPLY TO AFFECTED AREA TWICE A DAY FOR 14 DAYS Patient taking differently: Apply 1 application topically 2 (two) times daily as needed (for dry skin).  06/29/16  Midge Minium, MD  Vitamin D, Ergocalciferol, (DRISDOL) 50000 units CAPS capsule Take 1 capsule (50,000 Units total) by mouth every 7 (seven) days. Patient not taking: Reported on 11/20/2016 08/04/16   Midge Minium, MD  XARELTO 20 MG TABS tablet TAKE 1 TABLET BY MOUTH EVERY DAY WITH SUPPER 10/16/16   Bensimhon, Shaune Pascal, MD                                                                                                                                    Past Surgical History Past Surgical History:  Procedure Laterality Date  . BREAST MASS EXCISION     age 89, benign tumor  . CARDIAC CATHETERIZATION    . COLONOSCOPY    . DILATATION & CURETTAGE/HYSTEROSCOPY WITH MYOSURE N/A 09/22/2016   Performed by Molli Posey, MD at [facility] ORS  . DILATION AND CURETTAGE OF UTERUS    . KNEE SURGERY     right, removed cartilage  . LEFT BREAST LUMPECTOMY WITH RADIOACTIVE SEED LOCALIZATION Left 09/11/2016   Performed by Excell Seltzer, MD at [facility]  . Left Heart Cath and Coronary Angiography N/A 12/30/2015   Performed by Belva Crome, MD at [facility]  . PARATHYROIDECTOMY N/A 08/25/2014   Performed by Armandina Gemma, MD at [name] ORS  . TONSILLECTOMY    . TUBAL LIGATION     Family History Family History  Problem Relation  Age of Onset  . Breast cancer Mother 5  . Alcohol abuse Mother   . Alcohol abuse Father   . Alcohol abuse Maternal Grandfather   . Drug abuse Maternal Grandfather   . Alcohol abuse Maternal Grandmother   . Alcohol abuse Paternal Grandfather   . Alcohol abuse Paternal Grandmother   . Schizophrenia Cousin     Social History Social History   Tobacco Use  . Smoking status: Never Smoker  . Smokeless tobacco: Never Used  Substance Use Topics  . Alcohol use: Yes    Alcohol/week: 0.0 oz    Comment: occ.  . Drug use: No   Allergies Eggs or egg-derived products and Lamictal [lamotrigine]  Review of Systems Review of Systems All other systems are reviewed and are negative for acute change except as noted in the HPI  Physical Exam Vital Signs  I have reviewed the triage vital signs BP (!) 153/97 (BP Location: Left Arm)   Pulse 66   Temp 98.4 F (36.9 C) (Oral)   Resp 16   Ht 6' (1.829 m)   Wt (!) 152.9 kg (337 lb)   SpO2 96%   BMI 45.71 kg/m   Physical Exam  Constitutional: She is oriented to person, place, and time. She appears well-developed and well-nourished. No distress.  HENT:  Head: Normocephalic and atraumatic.  Nose: Mucosal edema and rhinorrhea present.  Postnasal drip  Eyes: Conjunctivae and EOM are normal. Pupils are equal, round, and reactive to light. Right  eye exhibits no discharge. Left eye exhibits no discharge. No scleral icterus.  Neck: Normal range of motion. Neck supple.  Cardiovascular: Normal rate and regular rhythm. Exam reveals no gallop and no friction rub.  No murmur heard. Pulmonary/Chest: Effort normal and breath sounds normal. No stridor. No respiratory distress. She has no rales.  Abdominal: Soft. She exhibits no distension. There is no tenderness.  Musculoskeletal: She exhibits no edema or tenderness.  Neurological: She is alert and oriented to person, place, and time.  Skin: Skin is warm and dry. No rash noted. She is not diaphoretic.  No erythema.  Psychiatric: She has a normal mood and affect.  Vitals reviewed.   ED Results and Treatments Labs (all labs ordered are listed, but only abnormal results are displayed) Labs Reviewed  CBC WITH DIFFERENTIAL/PLATELET - Abnormal; Notable for the following components:      Result Value   RBC 5.34 (*)    Hemoglobin 15.5 (*)    Platelets 130 (*)    All other components within normal limits  BASIC METABOLIC PANEL - Abnormal; Notable for the following components:   Glucose, Bld 104 (*)    All other components within normal limits                                                                                                                         EKG  EKG Interpretation  Date/Time:    Ventricular Rate:    PR Interval:    QRS Duration:   QT Interval:    QTC Calculation:   R Axis:     Text Interpretation:        Radiology Dg Chest 2 View  Result Date: 12/11/2016 CLINICAL DATA:  61 y/o female here with chest palpitations, cough, SOB x2 weeks. Two days ago she experienced headache, dizziness, and a couple of instances where her TV was on and she heard an unknown language being spoken. Never occurred before. Hx heart disease, DM, HTN, non-smoker, pna EXAM: CHEST  2 VIEW COMPARISON:  12/29/2015 FINDINGS: Cardiac silhouette is normal in size. Normal mediastinal and hilar contours. Minor linear scarring or atelectasis in the left upper lobe lingula, stable. Lungs otherwise clear. No pleural effusion or pneumothorax. Skeletal structures are intact. IMPRESSION: No active cardiopulmonary disease. Electronically Signed   By: Lajean Manes M.D.   On: 12/11/2016 17:57   Ct Head Wo Contrast  Result Date: 12/11/2016 CLINICAL DATA:  Acute headache, dizziness, episode of altered mental status EXAM: CT HEAD WITHOUT CONTRAST TECHNIQUE: Contiguous axial images were obtained from the base of the skull through the vertex without intravenous contrast. Sagittal and coronal MPR images  reconstructed from axial data set. COMPARISON:  None ; prior exam on the time line from 09/11/2010 does not have images attached FINDINGS: Brain: Generalized atrophy. Normal ventricular morphology. No midline shift or mass effect. Small vessel chronic ischemic changes of deep cerebral white matter. No intracranial hemorrhage, mass lesion, evidence of acute infarction, or extra-axial fluid  collection. Vascular: Unremarkable Skull: Intact Sinuses/Orbits: Clear Other: N/A IMPRESSION: Atrophy with small vessel chronic ischemic changes of deep cerebral white matter. No acute intracranial abnormalities. Electronically Signed   By: Lavonia Dana M.D.   On: 12/11/2016 18:00   Pertinent labs & imaging results that were available during my care of the patient were reviewed by me and considered in my medical decision making (see chart for details).  Medications Ordered in ED Medications  acetaminophen (TYLENOL) tablet 1,000 mg (1,000 mg Oral Given 12/11/16 1744)                                                                                                                                    Procedures Procedures  (including critical care time)  Medical Decision Making / ED Course I have reviewed the nursing notes for this encounter and the patient's prior records (if available in EHR or on provided paperwork).    Patient with symptoms of upper respiratory infection.  Chest x-ray without evidence of pneumonia.  She is also having intermittent difficulty understanding speech that only last several minutes at a time.  No focal weakness or other neurologic deficits.  Exam was nonfocal.  Labs grossly reassuring without evidence of electrolyte derangements.  CT head without evidence of ICH or mass-effect.  Low suspicion for meningitis.  Not consistent with subarachnoid hemorrhage.   The patient appears reasonably screened and/or stabilized for discharge and I doubt any other medical condition or other [facility] requiring  further screening, evaluation, or treatment in the ED at this time prior to discharge.  The patient is safe for discharge with strict return precautions.   Final Clinical Impression(s) / ED Diagnoses Final diagnoses:  Cough  Acute nasopharyngitis   Disposition: Discharge  Condition: Good  I have discussed the results, Dx and Tx plan with the patient who expressed understanding and agree(s) with the plan. Discharge instructions discussed at great length. The patient was given strict return precautions who verbalized understanding of the instructions. No further questions at time of discharge.    ED Discharge Orders    None       Follow Up: Midge Minium, MD [address] [place] [address] [phone]  Schedule an appointment as soon as possible for a visit  in 5-7 days, If symptoms do not improve or  worsen  Neurology   As scheduled      This chart was dictated using voice recognition software.  Despite best efforts to proofread,  errors can occur which can change the documentation meaning.   Fatima Blank, MD 12/11/16 [phone]

## 2016-12-11 NOTE — Telephone Encounter (Signed)
Agree w/ advice given 

## 2016-12-11 NOTE — Telephone Encounter (Signed)
Please call and triage pt as her symptoms sound more pressing than an appt on Wednesday

## 2016-12-11 NOTE — Telephone Encounter (Signed)
Hoping you can get more info as I am worried that she is having seizures triggered around the same time each evening... Thank you!

## 2016-12-11 NOTE — Telephone Encounter (Signed)
Pt called c/o whenever she is watching TV these last 3 days in the evening, the speech from the actors sound like a foreign language. Pt states dosen't last very long, just a few seconds. Only happens in the evening.   Pt has only taken 1 tessalon pearle for her cold. And has not taken any other meds except her b/p med. She denies numbness, tingling and difficulty speaking. She dose have tremors and she will be seeing a neurologist next week for this. Appointment was made with her primary for this week.  "Advised pt that if she start feeling worst or seeing more symptoms, she should go to the Urgent Care. Pt voiced understanding.  Answer Assessment - Initial Assessment Questions 1. SYMPTOM: "What is the main symptom you are concerned about?" (e.g., weakness, numbness)     Not being able to understanding the actors on tv 2. ONSET: "When did this start?" (minutes, hours, days; while sleeping)     Friday 3. LAST NORMAL: "When was the last time you were normal (no symptoms)?"     Most of the day patient is okay 4. PATTERN "Does this come and go, or has it been constant since it started?"  "Is it present now?"     Comes and goes, esp evenings 5. CARDIAC SYMPTOMS: "Have you had any of the following symptoms: chest pain, difficulty breathing, palpitations?"     Palpitations and shortness of breath 6. NEUROLOGIC SYMPTOMS: "Have you had any of the following symptoms: headache, dizziness, vision loss, double vision, changes in speech, unsteady on your feet?"     Headache, dizziness 7. OTHER SYMPTOMS: "Do you have any other symptoms?"     no 8. PREGNANCY: "Is there any chance you are pregnant?" "When was your last menstrual period?"     no  Protocols used: NEUROLOGIC DEFICIT-A-AH

## 2016-12-13 ENCOUNTER — Other Ambulatory Visit: Payer: Self-pay

## 2016-12-13 ENCOUNTER — Ambulatory Visit: Payer: BC Managed Care – PPO | Admitting: Family Medicine

## 2016-12-13 ENCOUNTER — Encounter: Payer: Self-pay | Admitting: Family Medicine

## 2016-12-13 VITALS — BP 138/89 | HR 81 | Temp 98.1°F | Resp 16 | Ht 72.0 in | Wt 332.5 lb

## 2016-12-13 DIAGNOSIS — F3132 Bipolar disorder, current episode depressed, moderate: Secondary | ICD-10-CM

## 2016-12-13 DIAGNOSIS — R440 Auditory hallucinations: Secondary | ICD-10-CM | POA: Insufficient documentation

## 2016-12-13 DIAGNOSIS — R251 Tremor, unspecified: Secondary | ICD-10-CM

## 2016-12-13 DIAGNOSIS — J0100 Acute maxillary sinusitis, unspecified: Secondary | ICD-10-CM

## 2016-12-13 DIAGNOSIS — I10 Essential (primary) hypertension: Secondary | ICD-10-CM

## 2016-12-13 MED ORDER — AMOXICILLIN-POT CLAVULANATE 875-125 MG PO TABS: 1.0000 | ORAL_TABLET | Freq: Two times a day (BID) | ORAL | 0 refills | Status: DC

## 2016-12-13 MED ORDER — LISINOPRIL 20 MG PO TABS: 20.0000 mg | ORAL_TABLET | Freq: Every day | ORAL | 3 refills | Status: DC

## 2016-12-13 NOTE — Assessment & Plan Note (Signed)
New.  Pt's sxs and PE are consistent w/ infxn.  Start abx.  Reviewed supportive care and red flags that should prompt return.  Pt expressed understanding and is in agreement w/ plan.

## 2016-12-13 NOTE — Assessment & Plan Note (Signed)
Pt's PHQ-9 score is 16.  Her depression is not well controlled.  She has a psych appt on Monday and encouraged her to discuss medication adjustments- and also mention the tremors.  Names and #s provided for psychologists so she can work through some of her ongoing family issues.  Will follow along.

## 2016-12-13 NOTE — Assessment & Plan Note (Signed)
Deteriorated.  Pt sees Neuro on Monday.  Unclear if this is Parkinsonism, tardive dyskinesia, or other issue.  Already on beta blocker.  Will await neuro evaluation.

## 2016-12-13 NOTE — Assessment & Plan Note (Signed)
Deteriorated.  Pt has been under considerable stress and is currently ill, so it's difficult to determine if this is truly worsening HTN or situational elevation.  Given her cardiac hx, will increase Lisinopril to 20mg  daily and follow closely.

## 2016-12-13 NOTE — Progress Notes (Signed)
   Subjective:    Patient ID: Kathleen [name], female    DOB: 04-26-1955, 61 y.o.   MRN: [id number]  HPI ER f/u- pt went to ER on 11/19 for SOB w/ just turning over in bed, palpitations x2 weeks, HA, and episodes of not being able to understand the TV b/c she felt it was a foreign language.  The ER listed her reason for visit as simply 'cough'.  Today pt reports she has not heard the TV in a foreign language since Sunday.    Depression- pt is struggling with the holidays b/c 'my kids hate me' and 'I can't see my grandkids'.  She has a psychiatrist appt on Monday at 3.  Is in the process of setting up counseling.  She reports she is 'not able to take the basic care of myself'.    Tremors- these have worsened, she sees Neuro on Monday.  HTN- chronic problem but BP has been elevated recently.  On Lisinopril 10mg , Metoprolol 50mg .  Denies CP but reports palpitations.  Reports SOB but 'only w/ my cold'.  No swelling more than usual.  URI- no fevers.  + maxillary sinus pain.  + tooth pain.  Had dizziness on Friday but this resolved.  + productive cough- 'milky white'.  Review of Systems For ROS see HPI     Objective:   Physical Exam  Constitutional: She is oriented to person, place, and time. She appears well-developed and well-nourished. No distress.  obese  HENT:  Head: Normocephalic and atraumatic.  Right Ear: Tympanic membrane normal.  Left Ear: Tympanic membrane normal.  Nose: Mucosal edema and rhinorrhea present. Right sinus exhibits maxillary sinus tenderness and frontal sinus tenderness. Left sinus exhibits maxillary sinus tenderness and frontal sinus tenderness.  Mouth/Throat: Uvula is midline and mucous membranes are normal. Posterior oropharyngeal erythema present. No oropharyngeal exudate.  Eyes: Conjunctivae and EOM are normal. Pupils are equal, round, and reactive to light.  Neck: Normal range of motion. Neck supple.  Cardiovascular: Normal rate, regular rhythm and normal heart  sounds.  Pulmonary/Chest: Effort normal and breath sounds normal. No respiratory distress. She has no wheezes.  Lymphadenopathy:    She has no cervical adenopathy.  Neurological: She is alert and oriented to person, place, and time.  Resting tremor of L hand/arm  Skin: Skin is warm and dry.  Psychiatric:  Flat affect  Vitals reviewed.         Assessment & Plan:

## 2016-12-13 NOTE — Patient Instructions (Addendum)
Follow up in 1 month to recheck BP Increase the Lisinopril to 20mg  daily- 2 of what you have at home and 1 of the new prescription Start the Augmentin twice daily- take w/ food- for the sinus infection Please mention hearing foreign languages when you go to Neuro on Monday Please tell psych that you are struggling as they may need to adjust your meds (also mention the tremors) Call with any questions or concerns Hang in there!!!

## 2016-12-13 NOTE — Assessment & Plan Note (Signed)
New.  Pt has not had any sxs since Sunday.  Occurred in the evenings while watching TV and resolved spontaneously.  CT of head in ER showed no obvious cause.  Encouraged her to discuss this w/ neuro as this could be possible seizures.  Will follow.

## 2016-12-18 ENCOUNTER — Encounter: Payer: Self-pay | Admitting: Neurology

## 2016-12-18 ENCOUNTER — Ambulatory Visit: Payer: BC Managed Care – PPO | Admitting: Neurology

## 2016-12-18 VITALS — BP 104/70 | HR 80 | Ht 72.0 in | Wt 331.0 lb

## 2016-12-18 DIAGNOSIS — R251 Tremor, unspecified: Secondary | ICD-10-CM

## 2016-12-18 DIAGNOSIS — F3132 Bipolar disorder, current episode depressed, moderate: Secondary | ICD-10-CM

## 2016-12-23 ENCOUNTER — Other Ambulatory Visit: Payer: Self-pay | Admitting: Cardiology

## 2017-01-05 ENCOUNTER — Other Ambulatory Visit (HOSPITAL_COMMUNITY): Payer: Self-pay | Admitting: *Deleted

## 2017-01-08 ENCOUNTER — Ambulatory Visit: Payer: BC Managed Care – PPO | Admitting: Psychology

## 2017-01-10 ENCOUNTER — Other Ambulatory Visit (HOSPITAL_COMMUNITY): Payer: Self-pay | Admitting: Adult Health

## 2017-01-10 DIAGNOSIS — I5032 Chronic diastolic (congestive) heart failure: Secondary | ICD-10-CM

## 2017-01-21 ENCOUNTER — Other Ambulatory Visit: Payer: Self-pay | Admitting: Cardiology

## 2017-01-25 ENCOUNTER — Encounter: Payer: Self-pay | Admitting: General Practice

## 2017-01-25 ENCOUNTER — Encounter: Payer: Self-pay | Admitting: Family Medicine

## 2017-01-25 ENCOUNTER — Other Ambulatory Visit: Payer: Self-pay

## 2017-01-25 ENCOUNTER — Ambulatory Visit: Payer: BC Managed Care – PPO | Admitting: Family Medicine

## 2017-01-25 VITALS — BP 122/87 | HR 80 | Temp 98.0°F | Resp 17 | Ht 72.0 in | Wt 329.5 lb

## 2017-01-25 DIAGNOSIS — I10 Essential (primary) hypertension: Secondary | ICD-10-CM | POA: Diagnosis not present

## 2017-01-25 LAB — BASIC METABOLIC PANEL
BUN: 16 mg/dL (ref 6–23)
CHLORIDE: 103 meq/L (ref 96–112)
CO2: 32 meq/L (ref 19–32)
CREATININE: 0.83 mg/dL (ref 0.40–1.20)
Calcium: 9.1 mg/dL (ref 8.4–10.5)
GFR: 74.2 mL/min (ref >60.00)
Glucose, Bld: 112 mg/dL — ABNORMAL HIGH (ref 70–99)
POTASSIUM: 4.6 meq/L (ref 3.5–5.1)
Sodium: 142 mEq/L (ref 135–145)

## 2017-01-25 NOTE — Progress Notes (Signed)
   Subjective:    Patient ID: Kathleen [name], female    DOB: Oct 04, 1955, 62 y.o.   MRN: [id number]  HPI HTN- chronic problem.  Lisinopril was doubled at last visit to 20mg  daily.  BP is much better controlled today.  On Metoprolol 50mg  daily as well.  No CP, SOB, HAs, visual changes, edema.   Review of Systems For ROS see HPI     Objective:   Physical Exam  Constitutional: She is oriented to person, place, and time. She appears well-developed and well-nourished. No distress.  HENT:  Head: Normocephalic and atraumatic.  Eyes: Conjunctivae and EOM are normal. Pupils are equal, round, and reactive to light.  Neck: Normal range of motion. Neck supple. No thyromegaly present.  Cardiovascular: Normal rate, regular rhythm, normal heart sounds and intact distal pulses.  No murmur heard. Pulmonary/Chest: Effort normal and breath sounds normal. No respiratory distress.  Abdominal: Soft. She exhibits no distension. There is no tenderness.  Musculoskeletal: She exhibits no edema.  Lymphadenopathy:    She has no cervical adenopathy.  Neurological: She is alert and oriented to person, place, and time.  Skin: Skin is warm and dry.  Psychiatric: She has a normal mood and affect. Her behavior is normal.  Vitals reviewed.         Assessment & Plan:

## 2017-01-25 NOTE — Assessment & Plan Note (Signed)
Chronic problem.  Much better control since increasing Lisinopril to 20mg  daily.  Asymptomatic.  Check labs to assess Cr and K.  No anticipated med changes.  Will follow.

## 2017-01-25 NOTE — Patient Instructions (Signed)
Follow up as scheduled We'll notify you of your lab results and make any changes if needed No med changes at this time- things look great! Keep up the good work on healthy diet and regular exercise- you're doing great! Call with any questions or concerns Happy New Year!

## 2017-02-03 ENCOUNTER — Other Ambulatory Visit: Payer: Self-pay | Admitting: Family Medicine

## 2017-02-05 ENCOUNTER — Ambulatory Visit (INDEPENDENT_AMBULATORY_CARE_PROVIDER_SITE_OTHER): Payer: BC Managed Care – PPO | Admitting: Psychology

## 2017-02-05 DIAGNOSIS — F3181 Bipolar II disorder: Secondary | ICD-10-CM | POA: Diagnosis not present

## 2017-02-09 ENCOUNTER — Telehealth: Payer: Self-pay | Admitting: Family Medicine

## 2017-02-09 NOTE — Telephone Encounter (Signed)
Pt took this to Dr. Jeffie Pollock already, the surgeon would like for you to clear her due to her diabetes.

## 2017-02-09 NOTE — Telephone Encounter (Signed)
Form completed and placed in basket- no charge

## 2017-02-09 NOTE — Telephone Encounter (Signed)
Received forms from back, faxed at pt request.

## 2017-02-09 NOTE — Telephone Encounter (Signed)
Pt would need clearance from Cards prior to proceeding.

## 2017-02-09 NOTE — Telephone Encounter (Signed)
Patient brought in paperwork to surgical (Rotator Cuff) clearance. Form with charge sheet in bin up front.

## 2017-02-09 NOTE — Telephone Encounter (Signed)
Please advise pt sees Dr. Jeffie Pollock

## 2017-02-12 NOTE — Telephone Encounter (Signed)
Once faxed and confirmed fax was received. Surgical clearance form was sent to scan dept. This went out on Friday.

## 2017-02-12 NOTE — Telephone Encounter (Signed)
Pt states she got a call from [facility], stating the never received the form from DR [name] for surgical clearance. Would like you fax to  [phone]

## 2017-02-12 NOTE — Telephone Encounter (Signed)
Spoke to [facility].) they have received fax, fax was missed placed in their office.

## 2017-02-12 NOTE — Telephone Encounter (Signed)
The only thing I can suggest it to e-mail Sherlon Handing and ask her to e-mail a copy of it back to [place] or to have it scanned ASAP so that we can access it. Levada Dy - will you please e,ail her to see?   Thank  You, Estill Bamberg

## 2017-02-12 NOTE — Telephone Encounter (Signed)
Can we refax this

## 2017-02-12 NOTE — Telephone Encounter (Signed)
Is there a way we can get this form back? Or could it be scanned in sooner? I am not sure who to contact at scanning.

## 2017-02-15 ENCOUNTER — Telehealth (HOSPITAL_COMMUNITY): Payer: Self-pay | Admitting: *Deleted

## 2017-02-15 NOTE — Telephone Encounter (Signed)
Received Surgical Clearance from [facility] for patient to have Right shoulder arthroscopic rotator cuff repair.    Dr. Vaughan Browner has cleared patient for surgery. Clearance faxed today to [phone].

## 2017-02-22 NOTE — Pre-Procedure Instructions (Addendum)
Kathleen [name]  02/22/2017      [id number] - [place], [place] - [address] AT [place] [address] Phone: [phone] Fax: [phone]    Your procedure is scheduled on February 5  Report to [facility] at [facility] A.M.  Call this number if you have problems the morning of surgery:  [phone]   Remember:  Do not eat food or drink liquids after midnight.  Continue all medications as directed by your physician except follow these medication instructions before surgery below   Take these medicines the morning of surgery with A SIP OF WATER  albuterol (PROVENTIL HFA;VENTOLIN HFA) allopurinol (ZYLOPRIM) benztropine (COGENTIN)  metoprolol succinate (TOPROL-XL) - make sure you take this medication the morning of surgery traMADol (ULTRAM)  7 days prior to surgery STOP taking any Aspirin(unless otherwise instructed by your surgeon), Aleve, Naproxen, Ibuprofen, Motrin, Advil, Goody's, BC's, all herbal medications, fish oil, and all vitamins  FOLLOW PHYSICIANS INSTRUCTIONS ABOUT XARELTO   WHAT DO I DO ABOUT MY DIABETES MEDICATION?   [name] Do not take oral diabetes medicines (pills) the morning of surgery. metFORMIN (GLUCOPHAGE)    How to Manage Your Diabetes Before and After Surgery  Why is it important to control my blood sugar before and after surgery? . Improving blood sugar levels before and after surgery helps healing and can limit problems. . A way of improving blood sugar control is eating a healthy diet by: o  Eating less sugar and carbohydrates o  Increasing activity/exercise o  Talking with your doctor about reaching your blood sugar goals . High blood sugars (greater than 180 mg/dL) can raise your risk of infections and slow your recovery, so you will need to focus on controlling your diabetes during the weeks before surgery. . Make sure that the doctor who takes care of your diabetes  knows about your planned surgery including the date and location.  How do I manage my blood sugar before surgery? . Check your blood sugar at least 4 times a day, starting 2 days before surgery, to make sure that the level is not too high or low. o Check your blood sugar the morning of your surgery when you wake up and every 2 hours until you get to the Short Stay unit. . If your blood sugar is less than 70 mg/dL, you will need to treat for low blood sugar: o Do not take insulin. o Treat a low blood sugar (less than 70 mg/dL) with  cup of clear juice (cranberry or apple), 4 glucose tablets, OR glucose gel. o Recheck blood sugar in 15 minutes after treatment (to make sure it is greater than 70 mg/dL). If your blood sugar is not greater than 70 mg/dL on recheck, call [phone] for further instructions. . Report your blood sugar to the short stay nurse when you get to Short Stay.  . If you are admitted to the hospital after surgery: o Your blood sugar will be checked by the staff and you will probably be given insulin after surgery (instead of oral diabetes medicines) to make sure you have good blood sugar levels. o The goal for blood sugar control after surgery is 80-180 mg/dL.      Do not wear jewelry, make-up or nail polish.  Do not wear lotions, powders, or perfumes, or deodorant.  Do not shave 48 hours prior to surgery.  Men may shave face and neck.  Do not bring valuables to the hospital.  [facility] is not responsible for any belongings or valuables.  Contacts, dentures or bridgework may not be worn into surgery.  Leave your suitcase in the car.  After surgery it may be brought to your room.  For patients admitted to the hospital, discharge time will be determined by your treatment team.  Patients discharged the day of surgery will not be allowed to drive home.    Special instructions:   [facility]- Preparing For Surgery  Before surgery, you can play an important role.  Because skin is not sterile, your skin needs to be as free of germs as possible. You can reduce the number of germs on your skin by washing with CHG (chlorahexidine gluconate) Soap before surgery.  CHG is an antiseptic cleaner which kills germs and bonds with the skin to continue killing germs even after washing.  Please do not use if you have an allergy to CHG or antibacterial soaps. If your skin becomes reddened/irritated stop using the CHG.  Do not shave (including legs and underarms) for at least 48 hours prior to first CHG shower. It is OK to shave your face.  Please follow these instructions carefully.   1. Shower the NIGHT BEFORE SURGERY and the MORNING OF SURGERY with CHG.   2. If you chose to wash your hair, wash your hair first as usual with your normal shampoo.  3. After you shampoo, rinse your hair and body thoroughly to remove the shampoo.  4. Use CHG as you would any other liquid soap. You can apply CHG directly to the skin and wash gently with a scrungie or a clean washcloth.   5. Apply the CHG Soap to your body ONLY FROM THE NECK DOWN.  Do not use on open wounds or open sores. Avoid contact with your eyes, ears, mouth and genitals (private parts). Wash Face and genitals (private parts)  with your normal soap.  6. Wash thoroughly, paying special attention to the area where your surgery will be performed.  7. Thoroughly rinse your body with warm water from the neck down.  8. DO NOT shower/wash with your normal soap after using and rinsing off the CHG Soap.  9. Pat yourself dry with a CLEAN TOWEL.  10. Wear CLEAN PAJAMAS to bed the night before surgery, wear comfortable clothes the morning of surgery  11. Place CLEAN SHEETS on your bed the night of your first shower and DO NOT SLEEP WITH PETS.    Day of Surgery: Do not apply any deodorants/lotions. Please wear clean clothes to the hospital/surgery center.      Please read over the following fact sheets that you were  given.

## 2017-02-23 ENCOUNTER — Encounter (HOSPITAL_COMMUNITY)
Admission: RE | Admit: 2017-02-23 | Discharge: 2017-02-23 | Disposition: A | Discharge status: Home / Self Care | Payer: BC Managed Care – PPO | Source: Ambulatory Visit | Attending: Orthopedic Surgery | Admitting: Orthopedic Surgery

## 2017-02-23 ENCOUNTER — Encounter (HOSPITAL_COMMUNITY): Payer: Self-pay

## 2017-02-23 ENCOUNTER — Other Ambulatory Visit: Payer: Self-pay

## 2017-02-23 DIAGNOSIS — I2510 Atherosclerotic heart disease of native coronary artery without angina pectoris: Secondary | ICD-10-CM | POA: Insufficient documentation

## 2017-02-23 DIAGNOSIS — E119 Type 2 diabetes mellitus without complications: Secondary | ICD-10-CM | POA: Diagnosis not present

## 2017-02-23 DIAGNOSIS — I110 Hypertensive heart disease with heart failure: Secondary | ICD-10-CM | POA: Insufficient documentation

## 2017-02-23 DIAGNOSIS — Z853 Personal history of malignant neoplasm of breast: Secondary | ICD-10-CM | POA: Insufficient documentation

## 2017-02-23 DIAGNOSIS — Z86718 Personal history of other venous thrombosis and embolism: Secondary | ICD-10-CM | POA: Diagnosis not present

## 2017-02-23 DIAGNOSIS — E039 Hypothyroidism, unspecified: Secondary | ICD-10-CM | POA: Diagnosis not present

## 2017-02-23 DIAGNOSIS — Z7901 Long term (current) use of anticoagulants: Secondary | ICD-10-CM | POA: Diagnosis not present

## 2017-02-23 DIAGNOSIS — Z9889 Other specified postprocedural states: Secondary | ICD-10-CM | POA: Insufficient documentation

## 2017-02-23 DIAGNOSIS — E785 Hyperlipidemia, unspecified: Secondary | ICD-10-CM | POA: Diagnosis not present

## 2017-02-23 DIAGNOSIS — K760 Fatty (change of) liver, not elsewhere classified: Secondary | ICD-10-CM | POA: Insufficient documentation

## 2017-02-23 DIAGNOSIS — J45909 Unspecified asthma, uncomplicated: Secondary | ICD-10-CM | POA: Insufficient documentation

## 2017-02-23 DIAGNOSIS — Z79899 Other long term (current) drug therapy: Secondary | ICD-10-CM | POA: Diagnosis not present

## 2017-02-23 DIAGNOSIS — I4891 Unspecified atrial fibrillation: Secondary | ICD-10-CM | POA: Insufficient documentation

## 2017-02-23 DIAGNOSIS — Z01812 Encounter for preprocedural laboratory examination: Secondary | ICD-10-CM | POA: Diagnosis present

## 2017-02-23 DIAGNOSIS — F319 Bipolar disorder, unspecified: Secondary | ICD-10-CM | POA: Diagnosis not present

## 2017-02-23 DIAGNOSIS — Z7984 Long term (current) use of oral hypoglycemic drugs: Secondary | ICD-10-CM | POA: Diagnosis not present

## 2017-02-23 DIAGNOSIS — I509 Heart failure, unspecified: Secondary | ICD-10-CM | POA: Insufficient documentation

## 2017-02-23 LAB — COMPREHENSIVE METABOLIC PANEL
ALT: 15 U/L (ref 14–54)
AST: 19 U/L (ref 15–41)
Albumin: 3.7 g/dL (ref 3.5–5.0)
Alkaline Phosphatase: 93 U/L (ref 38–126)
Anion gap: 12 (ref 5–15)
BUN: 15 mg/dL (ref 6–20)
CALCIUM: 9.3 mg/dL (ref 8.9–10.3)
CHLORIDE: 103 mmol/L (ref 101–111)
CO2: 25 mmol/L (ref 22–32)
Creatinine, Ser: 0.83 mg/dL (ref 0.44–1.00)
Glucose, Bld: 109 mg/dL — ABNORMAL HIGH (ref 65–99)
Potassium: 4.1 mmol/L (ref 3.5–5.1)
Sodium: 140 mmol/L (ref 135–145)
TOTAL PROTEIN: 6.4 g/dL — AB (ref 6.5–8.1)
Total Bilirubin: 0.7 mg/dL (ref 0.3–1.2)

## 2017-02-23 LAB — CBC
HCT: 43.9 % (ref 36.0–46.0)
Hemoglobin: 15 g/dL (ref 12.0–15.0)
MCH: 29.6 pg (ref 26.0–34.0)
MCHC: 34.2 g/dL (ref 30.0–36.0)
MCV: 86.6 fL (ref 78.0–100.0)
PLATELETS: 121 10*3/uL — AB (ref 150–400)
RBC: 5.07 MIL/uL (ref 3.87–5.11)
RDW: 14.5 % (ref 11.5–15.5)
WBC: 4.9 10*3/uL (ref 4.0–10.5)

## 2017-02-23 LAB — HEMOGLOBIN A1C
Hgb A1c MFr Bld: 6 % — ABNORMAL HIGH (ref 4.8–5.6)
MEAN PLASMA GLUCOSE: 125.5 mg/dL

## 2017-02-23 LAB — GLUCOSE, CAPILLARY: GLUCOSE-CAPILLARY: 104 mg/dL — AB (ref 65–99)

## 2017-02-23 NOTE — Progress Notes (Addendum)
PCP - Annye Asa Cardiologist - [place]  Chest x-ray - not neede EKG - 05/12/17 Stress Test - 2017 ECHO - 2017 Cardiac [id number] -   Sleep Study - 2015 CPAP - study was negative  Fasting Blood Sugar - patient does not check her sugar at home   Blood Thinner Instructions: last dose of Xarelto 02/22/17   Anesthesia review: YEs Pressure elevated at PAT appointment - educated patient to continue blood pressure medications and eduacted about limiting salt in her diet, to check her pressure at home or any local pharmacy or local rescue squad can also help he with that.  If her pressures continue to be elevated then she should touch base with Cardiologist.  Dr. Tobias Alexander agreeded with plan  Patient denies shortness of breath, fever, cough and chest pain at PAT appointment   Patient verbalized understanding of instructions that were given to them at the PAT appointment. Patient was also instructed that they will need to review over the PAT instructions again at home before surgery.

## 2017-02-23 NOTE — Progress Notes (Signed)
   02/23/17 0806  OBSTRUCTIVE SLEEP APNEA  Have you ever been diagnosed with sleep apnea through a sleep study? No (sleep apnea test negative)  Do you snore loudly (loud enough to be heard through closed doors)?  1  Do you often feel tired, fatigued, or sleepy during the daytime (such as falling asleep during driving or talking to someone)? 1  Has anyone observed you stop breathing during your sleep? 0  Do you have, or are you being treated for high blood pressure? 1  BMI more than 35 kg/m2? 1  Age > 50 (1-yes) 1  Neck circumference greater than:Female 16 inches or larger, Female 17inches or larger? 1  Female Gender (Yes=1) 0  Obstructive Sleep Apnea Score 6  Score 5 or greater  Results sent to PCP

## 2017-02-26 MED ORDER — DEXTROSE 5 % IV SOLN
3.0000 g | INTRAVENOUS | Status: AC
  Administered 2017-02-27: 3 g via INTRAVENOUS
  Filled 2017-02-26: qty 3

## 2017-02-26 NOTE — Progress Notes (Addendum)
Anesthesia Chart Review:  Pt is a 62 year old female scheduled for R shoulder arthroscopic rotator cuff repair with biceps tenodesis and subacromial decompression on 02/27/2017 with Victorino December, MD  - PCP is Annye Asa, MD who cleared pt for surgery - Cardiologist is Glori Bickers, MD who cleared pt for surgery (telephone encounter 02/15/17 by Donnamae Jude, MD). Last office visit 08/09/16 with Darrick Grinder, NP; 1 year f/u recommended.    PMH includes:  CAD (Subtotally occluded second perforating branch of LAD, suspect spontaneous dissection 2009), CHF, atrial fibrillation, HTN, DM, hyperlipidemia, DVT, asthma, fatty liver, hypothyroidism, bipolar disorder, breast cancer, post-op N/V. S/p L breast lumpectomy 09/11/16. S/p parathyroidectomy 08/25/14.   Medications include: Albuterol, Depakote, Lasix, Latuda, lisinopril, metformin, metoprolol, rosuvastatin, Xarelto.  Last dose Xarelto 02/22/17.  BP (!) 173/82   Pulse 70   Temp (!) 36.4 C   Resp 20   Ht 5\' 11"  (1.803 m)   Wt (!) 325 lb (147.4 kg)   SpO2 95%   BMI 45.33 kg/m   Preoperative labs reviewed.   - HbA1c 6.0, glucose 109 - PT/INR will be obtained day of surgery  CXR 12/11/16: No active cardiopulmonary disease.  EKG 05/12/16: NSR   Cardiac cath 12/30/15:   The left ventricular ejection fraction is 50-55% by visual estimate.  There is no mitral valve regurgitation.  Widely patent coronary arteries. No atherosclerotic obstructions are noted.  Normal left ventricular function. Ejection fraction 55%. Mildly elevated left ventricular end-diastolic pressure.   Echo 08/05/15:  - Left ventricle: The cavity size was normal. Systolic function was normal. The estimated ejection fraction was in the range of 55% to 60%. Wall motion was normal; there were no regional wall motion abnormalities. Doppler parameters are consistent with abnormal left ventricular relaxation (grade 1 diastolic dysfunction). - Aortic valve: Trileaflet; mildly  thickened, mildly calcified leaflets. - Left atrium: The atrium was mildly dilated. Anterior-posterior dimension: 45 mm.  If no changes, I anticipate pt can proceed with surgery as scheduled.   Willeen Cass, FNP-BC [facility]/Anesthesiology Phone: [phone] 02/26/2017 10:31 AM

## 2017-02-27 ENCOUNTER — Ambulatory Visit (HOSPITAL_COMMUNITY)
Admission: RE | Admit: 2017-02-27 | Discharge: 2017-02-28 | Disposition: A | Discharge status: Home / Self Care | Payer: BC Managed Care – PPO | Source: Ambulatory Visit | Attending: Orthopedic Surgery | Admitting: Orthopedic Surgery

## 2017-02-27 ENCOUNTER — Ambulatory Visit (HOSPITAL_COMMUNITY): Payer: BC Managed Care – PPO | Admitting: Emergency Medicine

## 2017-02-27 ENCOUNTER — Encounter (HOSPITAL_COMMUNITY)
Admission: RE | Disposition: A | Discharge status: Home / Self Care | Payer: Self-pay | Source: Ambulatory Visit | Attending: Orthopedic Surgery

## 2017-02-27 ENCOUNTER — Ambulatory Visit (HOSPITAL_COMMUNITY): Payer: BC Managed Care – PPO | Admitting: Certified Registered Nurse Anesthetist

## 2017-02-27 ENCOUNTER — Encounter (HOSPITAL_COMMUNITY): Payer: Self-pay

## 2017-02-27 DIAGNOSIS — M19011 Primary osteoarthritis, right shoulder: Secondary | ICD-10-CM | POA: Insufficient documentation

## 2017-02-27 DIAGNOSIS — Z833 Family history of diabetes mellitus: Secondary | ICD-10-CM | POA: Insufficient documentation

## 2017-02-27 DIAGNOSIS — I499 Cardiac arrhythmia, unspecified: Secondary | ICD-10-CM | POA: Diagnosis not present

## 2017-02-27 DIAGNOSIS — R251 Tremor, unspecified: Secondary | ICD-10-CM | POA: Insufficient documentation

## 2017-02-27 DIAGNOSIS — R32 Unspecified urinary incontinence: Secondary | ICD-10-CM | POA: Diagnosis not present

## 2017-02-27 DIAGNOSIS — Z79899 Other long term (current) drug therapy: Secondary | ICD-10-CM | POA: Insufficient documentation

## 2017-02-27 DIAGNOSIS — I4891 Unspecified atrial fibrillation: Secondary | ICD-10-CM | POA: Insufficient documentation

## 2017-02-27 DIAGNOSIS — Z86718 Personal history of other venous thrombosis and embolism: Secondary | ICD-10-CM | POA: Insufficient documentation

## 2017-02-27 DIAGNOSIS — Z8619 Personal history of other infectious and parasitic diseases: Secondary | ICD-10-CM | POA: Insufficient documentation

## 2017-02-27 DIAGNOSIS — M7521 Bicipital tendinitis, right shoulder: Secondary | ICD-10-CM | POA: Insufficient documentation

## 2017-02-27 DIAGNOSIS — E785 Hyperlipidemia, unspecified: Secondary | ICD-10-CM | POA: Insufficient documentation

## 2017-02-27 DIAGNOSIS — I509 Heart failure, unspecified: Secondary | ICD-10-CM | POA: Insufficient documentation

## 2017-02-27 DIAGNOSIS — W19XXXA Unspecified fall, initial encounter: Secondary | ICD-10-CM | POA: Insufficient documentation

## 2017-02-27 DIAGNOSIS — I252 Old myocardial infarction: Secondary | ICD-10-CM | POA: Diagnosis not present

## 2017-02-27 DIAGNOSIS — I739 Peripheral vascular disease, unspecified: Secondary | ICD-10-CM | POA: Diagnosis not present

## 2017-02-27 DIAGNOSIS — Z87440 Personal history of urinary (tract) infections: Secondary | ICD-10-CM | POA: Diagnosis not present

## 2017-02-27 DIAGNOSIS — E119 Type 2 diabetes mellitus without complications: Secondary | ICD-10-CM | POA: Diagnosis not present

## 2017-02-27 DIAGNOSIS — F319 Bipolar disorder, unspecified: Secondary | ICD-10-CM | POA: Insufficient documentation

## 2017-02-27 DIAGNOSIS — F419 Anxiety disorder, unspecified: Secondary | ICD-10-CM | POA: Diagnosis not present

## 2017-02-27 DIAGNOSIS — E892 Postprocedural hypoparathyroidism: Secondary | ICD-10-CM | POA: Insufficient documentation

## 2017-02-27 DIAGNOSIS — I2510 Atherosclerotic heart disease of native coronary artery without angina pectoris: Secondary | ICD-10-CM | POA: Diagnosis present

## 2017-02-27 DIAGNOSIS — Z820 Family history of epilepsy and other diseases of the nervous system: Secondary | ICD-10-CM | POA: Insufficient documentation

## 2017-02-27 DIAGNOSIS — Y939 Activity, unspecified: Secondary | ICD-10-CM | POA: Diagnosis not present

## 2017-02-27 DIAGNOSIS — Z7984 Long term (current) use of oral hypoglycemic drugs: Secondary | ICD-10-CM | POA: Insufficient documentation

## 2017-02-27 DIAGNOSIS — K760 Fatty (change of) liver, not elsewhere classified: Secondary | ICD-10-CM | POA: Insufficient documentation

## 2017-02-27 DIAGNOSIS — M75101 Unspecified rotator cuff tear or rupture of right shoulder, not specified as traumatic: Secondary | ICD-10-CM | POA: Diagnosis present

## 2017-02-27 DIAGNOSIS — Z85828 Personal history of other malignant neoplasm of skin: Secondary | ICD-10-CM | POA: Diagnosis not present

## 2017-02-27 DIAGNOSIS — Z6841 Body Mass Index (BMI) 40.0 and over, adult: Secondary | ICD-10-CM | POA: Insufficient documentation

## 2017-02-27 DIAGNOSIS — Z803 Family history of malignant neoplasm of breast: Secondary | ICD-10-CM | POA: Insufficient documentation

## 2017-02-27 DIAGNOSIS — M75121 Complete rotator cuff tear or rupture of right shoulder, not specified as traumatic: Secondary | ICD-10-CM | POA: Diagnosis not present

## 2017-02-27 DIAGNOSIS — J45909 Unspecified asthma, uncomplicated: Secondary | ICD-10-CM | POA: Diagnosis not present

## 2017-02-27 DIAGNOSIS — I110 Hypertensive heart disease with heart failure: Secondary | ICD-10-CM | POA: Insufficient documentation

## 2017-02-27 DIAGNOSIS — Z8249 Family history of ischemic heart disease and other diseases of the circulatory system: Secondary | ICD-10-CM | POA: Insufficient documentation

## 2017-02-27 DIAGNOSIS — M7541 Impingement syndrome of right shoulder: Secondary | ICD-10-CM | POA: Diagnosis not present

## 2017-02-27 DIAGNOSIS — Z7901 Long term (current) use of anticoagulants: Secondary | ICD-10-CM | POA: Insufficient documentation

## 2017-02-27 DIAGNOSIS — Z8489 Family history of other specified conditions: Secondary | ICD-10-CM | POA: Insufficient documentation

## 2017-02-27 DIAGNOSIS — Z811 Family history of alcohol abuse and dependence: Secondary | ICD-10-CM | POA: Insufficient documentation

## 2017-02-27 DIAGNOSIS — S46011A Strain of muscle(s) and tendon(s) of the rotator cuff of right shoulder, initial encounter: Secondary | ICD-10-CM | POA: Diagnosis present

## 2017-02-27 HISTORY — PX: SHOULDER ARTHROSCOPY WITH ROTATOR CUFF REPAIR AND SUBACROMIAL DECOMPRESSION: SHX5686

## 2017-02-27 LAB — GLUCOSE, CAPILLARY
GLUCOSE-CAPILLARY: 89 mg/dL (ref 65–99)
GLUCOSE-CAPILLARY: 101 mg/dL — AB (ref 65–99)
Glucose-Capillary: 220 mg/dL — ABNORMAL HIGH (ref 65–99)

## 2017-02-27 LAB — PROTIME-INR
INR: 0.95
PROTHROMBIN TIME: 12.6 s (ref 11.4–15.2)

## 2017-02-27 SURGERY — SHOULDER ARTHROSCOPY WITH ROTATOR CUFF REPAIR AND SUBACROMIAL DECOMPRESSION
Anesthesia: General | Site: Shoulder | Laterality: Right

## 2017-02-27 MED ORDER — ACETAMINOPHEN 500 MG PO TABS
1000.0000 mg | ORAL_TABLET | Freq: Four times a day (QID) | ORAL | Status: DC
  Administered 2017-02-27 – 2017-02-28 (×3): 1000 mg via ORAL
  Filled 2017-02-27 (×3): qty 2

## 2017-02-27 MED ORDER — FENTANYL CITRATE (PF) 100 MCG/2ML IJ SOLN
INTRAMUSCULAR | Status: DC | PRN
  Administered 2017-02-27: 100 ug via INTRAVENOUS
  Administered 2017-02-27: 50 ug via INTRAVENOUS

## 2017-02-27 MED ORDER — LIDOCAINE HCL (CARDIAC) 20 MG/ML IV SOLN
INTRAVENOUS | Status: DC | PRN
  Administered 2017-02-27: 100 mg via INTRAVENOUS

## 2017-02-27 MED ORDER — ROSUVASTATIN CALCIUM 10 MG PO TABS
20.0000 mg | ORAL_TABLET | Freq: Every day | ORAL | Status: DC
  Administered 2017-02-27: 20 mg via ORAL
  Filled 2017-02-27: qty 2

## 2017-02-27 MED ORDER — LIDOCAINE 2% (20 MG/ML) 5 ML SYRINGE
INTRAMUSCULAR | Status: AC
  Filled 2017-02-27: qty 5

## 2017-02-27 MED ORDER — METOCLOPRAMIDE HCL 5 MG/ML IJ SOLN: 5.0000 mg | Freq: Three times a day (TID) | INTRAMUSCULAR | Status: DC | PRN

## 2017-02-27 MED ORDER — RIVAROXABAN 20 MG PO TABS
20.0000 mg | ORAL_TABLET | Freq: Every day | ORAL | Status: DC
  Administered 2017-02-28: 20 mg via ORAL
  Filled 2017-02-27: qty 1

## 2017-02-27 MED ORDER — BENZTROPINE MESYLATE 1 MG PO TABS
1.0000 mg | ORAL_TABLET | Freq: Two times a day (BID) | ORAL | Status: DC
  Administered 2017-02-27 – 2017-02-28 (×2): 1 mg via ORAL
  Filled 2017-02-27 (×2): qty 1

## 2017-02-27 MED ORDER — MORPHINE SULFATE (PF) 2 MG/ML IV SOLN: 2.0000 mg | INTRAVENOUS | Status: DC | PRN

## 2017-02-27 MED ORDER — CHLORHEXIDINE GLUCONATE 4 % EX LIQD: 60.0000 mL | Freq: Once | CUTANEOUS | Status: DC

## 2017-02-27 MED ORDER — METOCLOPRAMIDE HCL 5 MG PO TABS: 5.0000 mg | ORAL_TABLET | Freq: Three times a day (TID) | ORAL | Status: DC | PRN

## 2017-02-27 MED ORDER — FUROSEMIDE 40 MG PO TABS: 40.0000 mg | ORAL_TABLET | Freq: Two times a day (BID) | ORAL | Status: DC | PRN

## 2017-02-27 MED ORDER — OXYCODONE HCL 5 MG PO TABS
5.0000 mg | ORAL_TABLET | Freq: Four times a day (QID) | ORAL | 0 refills | Status: DC | PRN
Start: 2017-02-27 — End: 2017-07-30

## 2017-02-27 MED ORDER — FENTANYL CITRATE (PF) 100 MCG/2ML IJ SOLN: 25.0000 ug | INTRAMUSCULAR | Status: DC | PRN

## 2017-02-27 MED ORDER — SENNA 8.6 MG PO TABS
1.0000 | ORAL_TABLET | Freq: Two times a day (BID) | ORAL | Status: DC
  Administered 2017-02-27 – 2017-02-28 (×2): 8.6 mg via ORAL
  Filled 2017-02-27 (×2): qty 1

## 2017-02-27 MED ORDER — ALPRAZOLAM 0.5 MG PO TABS: 0.5000 mg | ORAL_TABLET | Freq: Two times a day (BID) | ORAL | Status: DC | PRN

## 2017-02-27 MED ORDER — FENTANYL CITRATE (PF) 100 MCG/2ML IJ SOLN
INTRAMUSCULAR | Status: AC
  Administered 2017-02-27: 100 ug via INTRAVENOUS
  Filled 2017-02-27: qty 2

## 2017-02-27 MED ORDER — MIDAZOLAM HCL 2 MG/2ML IJ SOLN
2.0000 mg | Freq: Once | INTRAMUSCULAR | Status: AC
  Administered 2017-02-27: 2 mg via INTRAVENOUS

## 2017-02-27 MED ORDER — PROPOFOL 10 MG/ML IV BOLUS
INTRAVENOUS | Status: DC | PRN
  Administered 2017-02-27: 200 mg via INTRAVENOUS

## 2017-02-27 MED ORDER — MIDAZOLAM HCL 2 MG/2ML IJ SOLN
INTRAMUSCULAR | Status: AC
  Filled 2017-02-27: qty 2

## 2017-02-27 MED ORDER — MIDAZOLAM HCL 2 MG/2ML IJ SOLN
INTRAMUSCULAR | Status: AC
  Administered 2017-02-27: 2 mg via INTRAVENOUS
  Filled 2017-02-27: qty 2

## 2017-02-27 MED ORDER — SODIUM CHLORIDE 0.9 % IR SOLN
Status: DC | PRN
  Administered 2017-02-27 (×2): 3000 mL

## 2017-02-27 MED ORDER — LURASIDONE HCL 20 MG PO TABS
60.0000 mg | ORAL_TABLET | Freq: Every day | ORAL | Status: DC
  Administered 2017-02-27: 60 mg via ORAL
  Filled 2017-02-27: qty 3

## 2017-02-27 MED ORDER — CEFAZOLIN SODIUM-DEXTROSE 2-4 GM/100ML-% IV SOLN
2.0000 g | Freq: Three times a day (TID) | INTRAVENOUS | Status: DC
Start: 2017-02-27 — End: 2017-02-28
  Administered 2017-02-27 – 2017-02-28 (×2): 2 g via INTRAVENOUS
  Filled 2017-02-27 (×3): qty 100

## 2017-02-27 MED ORDER — SUCCINYLCHOLINE CHLORIDE 20 MG/ML IJ SOLN
INTRAMUSCULAR | Status: DC | PRN
  Administered 2017-02-27: 100 mg via INTRAVENOUS

## 2017-02-27 MED ORDER — ACETAMINOPHEN 325 MG PO TABS: 650.0000 mg | ORAL_TABLET | ORAL | Status: DC | PRN

## 2017-02-27 MED ORDER — DEXAMETHASONE SODIUM PHOSPHATE 10 MG/ML IJ SOLN
INTRAMUSCULAR | Status: AC
  Filled 2017-02-27: qty 1

## 2017-02-27 MED ORDER — METFORMIN HCL 500 MG PO TABS
500.0000 mg | ORAL_TABLET | Freq: Two times a day (BID) | ORAL | Status: DC
  Administered 2017-02-28: 500 mg via ORAL
  Filled 2017-02-27: qty 1

## 2017-02-27 MED ORDER — ROPIVACAINE HCL 5 MG/ML IJ SOLN
INTRAMUSCULAR | Status: DC | PRN
  Administered 2017-02-27: 30 mL via PERINEURAL

## 2017-02-27 MED ORDER — MEPERIDINE HCL 25 MG/ML IJ SOLN: 6.2500 mg | INTRAMUSCULAR | Status: DC | PRN

## 2017-02-27 MED ORDER — LACTATED RINGERS IV SOLN: INTRAVENOUS | Status: DC

## 2017-02-27 MED ORDER — INSULIN ASPART 100 UNIT/ML ~~LOC~~ SOLN
0.0000 [IU] | Freq: Every day | SUBCUTANEOUS | Status: DC
  Administered 2017-02-27: 2 [IU] via SUBCUTANEOUS

## 2017-02-27 MED ORDER — NITROGLYCERIN 0.4 MG SL SUBL: 0.4000 mg | SUBLINGUAL_TABLET | SUBLINGUAL | Status: DC | PRN

## 2017-02-27 MED ORDER — ALLOPURINOL 100 MG PO TABS
200.0000 mg | ORAL_TABLET | Freq: Every day | ORAL | Status: DC
  Administered 2017-02-28: 200 mg via ORAL
  Filled 2017-02-27: qty 2

## 2017-02-27 MED ORDER — PROPOFOL 10 MG/ML IV BOLUS
INTRAVENOUS | Status: AC
  Filled 2017-02-27: qty 20

## 2017-02-27 MED ORDER — LISINOPRIL 20 MG PO TABS
20.0000 mg | ORAL_TABLET | Freq: Every day | ORAL | Status: DC
  Administered 2017-02-28: 20 mg via ORAL
  Filled 2017-02-27: qty 1

## 2017-02-27 MED ORDER — PHENYLEPHRINE HCL 10 MG/ML IJ SOLN
INTRAVENOUS | Status: DC | PRN
  Administered 2017-02-27: 40 ug/min via INTRAVENOUS

## 2017-02-27 MED ORDER — LACTATED RINGERS IV SOLN
INTRAVENOUS | Status: DC
  Administered 2017-02-27 (×2): via INTRAVENOUS

## 2017-02-27 MED ORDER — ONDANSETRON HCL 4 MG/2ML IJ SOLN
INTRAMUSCULAR | Status: AC
  Filled 2017-02-27: qty 2

## 2017-02-27 MED ORDER — METOCLOPRAMIDE HCL 5 MG/ML IJ SOLN: 10.0000 mg | Freq: Once | INTRAMUSCULAR | Status: DC | PRN

## 2017-02-27 MED ORDER — FENTANYL CITRATE (PF) 100 MCG/2ML IJ SOLN
100.0000 ug | Freq: Once | INTRAMUSCULAR | Status: AC
  Administered 2017-02-27: 100 ug via INTRAVENOUS

## 2017-02-27 MED ORDER — ONDANSETRON HCL 4 MG PO TABS: 4.0000 mg | ORAL_TABLET | Freq: Four times a day (QID) | ORAL | Status: DC | PRN

## 2017-02-27 MED ORDER — METOPROLOL SUCCINATE ER 50 MG PO TB24
50.0000 mg | ORAL_TABLET | Freq: Every day | ORAL | Status: DC
  Administered 2017-02-28: 50 mg via ORAL
  Filled 2017-02-27: qty 1

## 2017-02-27 MED ORDER — ALBUTEROL SULFATE (2.5 MG/3ML) 0.083% IN NEBU: 2.5000 mg | INHALATION_SOLUTION | Freq: Four times a day (QID) | RESPIRATORY_TRACT | Status: DC | PRN

## 2017-02-27 MED ORDER — DEXAMETHASONE SODIUM PHOSPHATE 10 MG/ML IJ SOLN
INTRAMUSCULAR | Status: DC | PRN
  Administered 2017-02-27: 10 mg via INTRAVENOUS

## 2017-02-27 MED ORDER — EPHEDRINE SULFATE 50 MG/ML IJ SOLN
INTRAMUSCULAR | Status: DC | PRN
  Administered 2017-02-27: 15 mg via INTRAVENOUS

## 2017-02-27 MED ORDER — INSULIN ASPART 100 UNIT/ML ~~LOC~~ SOLN
0.0000 [IU] | Freq: Three times a day (TID) | SUBCUTANEOUS | Status: DC
  Administered 2017-02-28 (×2): 3 [IU] via SUBCUTANEOUS

## 2017-02-27 MED ORDER — OXYCODONE HCL 5 MG PO TABS
10.0000 mg | ORAL_TABLET | ORAL | Status: DC | PRN
  Administered 2017-02-27 – 2017-02-28 (×3): 10 mg via ORAL
  Filled 2017-02-27 (×3): qty 2

## 2017-02-27 MED ORDER — ALBUTEROL SULFATE HFA 108 (90 BASE) MCG/ACT IN AERS
INHALATION_SPRAY | RESPIRATORY_TRACT | Status: DC | PRN
  Administered 2017-02-27 (×7): 2 via RESPIRATORY_TRACT

## 2017-02-27 MED ORDER — OXYCODONE HCL 5 MG PO TABS: 5.0000 mg | ORAL_TABLET | ORAL | Status: DC | PRN

## 2017-02-27 MED ORDER — ONDANSETRON HCL 4 MG/2ML IJ SOLN
INTRAMUSCULAR | Status: DC | PRN
  Administered 2017-02-27: 4 mg via INTRAVENOUS

## 2017-02-27 MED ORDER — ONDANSETRON HCL 4 MG/2ML IJ SOLN: 4.0000 mg | Freq: Four times a day (QID) | INTRAMUSCULAR | Status: DC | PRN

## 2017-02-27 MED ORDER — ACETAMINOPHEN 650 MG RE SUPP: 650.0000 mg | RECTAL | Status: DC | PRN

## 2017-02-27 MED ORDER — FENTANYL CITRATE (PF) 250 MCG/5ML IJ SOLN
INTRAMUSCULAR | Status: AC
  Filled 2017-02-27: qty 5

## 2017-02-27 MED ORDER — DIVALPROEX SODIUM ER 500 MG PO TB24
1500.0000 mg | ORAL_TABLET | Freq: Every day | ORAL | Status: DC
  Administered 2017-02-27: 1500 mg via ORAL
  Filled 2017-02-27: qty 3

## 2017-02-27 SURGICAL SUPPLY — 61 items
ANCH SUT SWLK 19.1X4.75 (Anchor) ×1 IMPLANT
ANCHOR SUT BIO SW 4.75X19.1 (Anchor) ×1 IMPLANT
BLADE GREAT WHITE 4.2 (BLADE) ×2 IMPLANT
BLADE SURG 11 STRL SS (BLADE) ×2 IMPLANT
BUR OVAL 4.0 (BURR) ×2 IMPLANT
BURR OVAL 8 FLU 4.0X13 (MISCELLANEOUS) ×1 IMPLANT
CANNULA 5.75X7 CRYSTAL CLEAR (CANNULA) ×2 IMPLANT
CANNULA TWIST IN 8.25X7CM (CANNULA) ×2 IMPLANT
CUTTER BONE 4.0MM X 13CM (MISCELLANEOUS) ×1 IMPLANT
DRAPE INCISE IOBAN 66X45 STRL (DRAPES) ×2 IMPLANT
DRAPE STERI 35X30 U-POUCH (DRAPES) ×2 IMPLANT
DRAPE U-SHAPE 47X51 STRL (DRAPES) ×2 IMPLANT
DRSG PAD ABDOMINAL 8X10 ST (GAUZE/BANDAGES/DRESSINGS) ×4 IMPLANT
DURAPREP 26ML APPLICATOR (WOUND CARE) ×4 IMPLANT
GAUZE SPONGE 4X4 12PLY STRL (GAUZE/BANDAGES/DRESSINGS) ×2 IMPLANT
GAUZE XEROFORM 1X8 LF (GAUZE/BANDAGES/DRESSINGS) ×1 IMPLANT
GLOVE BIO SURGEON STRL SZ7.5 (GLOVE) ×1 IMPLANT
GLOVE BIOGEL PI IND STRL 7.0 (GLOVE) ×1 IMPLANT
GLOVE BIOGEL PI IND STRL 8 (GLOVE) IMPLANT
GLOVE BIOGEL PI INDICATOR 7.0 (GLOVE) ×1
GLOVE BIOGEL PI INDICATOR 8 (GLOVE) ×1
GLOVE ECLIPSE 7.0 STRL STRAW (GLOVE) ×2 IMPLANT
GLOVE SKINSENSE NS SZ7.5 (GLOVE) ×2
GLOVE SKINSENSE STRL SZ7.5 (GLOVE) ×2 IMPLANT
GLOVE SURG SS PI 6.5 STRL IVOR (GLOVE) ×1 IMPLANT
GOWN STRL REIN XL XLG (GOWN DISPOSABLE) ×2 IMPLANT
GOWN STRL REUS W/ TWL LRG LVL3 (GOWN DISPOSABLE) IMPLANT
GOWN STRL REUS W/TWL LRG LVL3 (GOWN DISPOSABLE) ×2
KIT BASIN OR (CUSTOM PROCEDURE TRAY) ×2 IMPLANT
KIT ROOM TURNOVER OR (KITS) ×2 IMPLANT
KIT SHOULDER TRACTION (DRAPES) ×2 IMPLANT
MANIFOLD NEPTUNE II (INSTRUMENTS) ×2 IMPLANT
NDL SCORPION MULTI FIRE (NEEDLE) ×1 IMPLANT
NDL SPNL 18GX3.5 QUINCKE PK (NEEDLE) ×1 IMPLANT
NEEDLE SCORPION MULTI FIRE (NEEDLE) ×2 IMPLANT
NEEDLE SPNL 18GX3.5 QUINCKE PK (NEEDLE) ×2 IMPLANT
NS IRRIG 1000ML POUR BTL (IV SOLUTION) ×2 IMPLANT
PACK SHOULDER (CUSTOM PROCEDURE TRAY) ×2 IMPLANT
PAD ABD 8X10 STRL (GAUZE/BANDAGES/DRESSINGS) ×1 IMPLANT
PAD ARMBOARD 7.5X6 YLW CONV (MISCELLANEOUS) ×4 IMPLANT
PASSER SUT SWIFTSTITCH HIP CRT (INSTRUMENTS) ×1 IMPLANT
PROBE BIPOLAR ATHRO 135MM 90D (MISCELLANEOUS) ×1 IMPLANT
SET ARTHROSCOPY TUBING (MISCELLANEOUS) ×2
SET ARTHROSCOPY TUBING LN (MISCELLANEOUS) ×1 IMPLANT
SLEEVE ARM SUSPENSION SYSTEM (MISCELLANEOUS) ×1 IMPLANT
SLING ARM FOAM STRAP LRG (SOFTGOODS) IMPLANT
SLING ARM FOAM STRAP MED (SOFTGOODS) IMPLANT
SLING S3 LATERAL DISP (MISCELLANEOUS) ×1 IMPLANT
SPONGE LAP 4X18 X RAY DECT (DISPOSABLE) ×3 IMPLANT
SUT ETHILON 3 0 PS 1 (SUTURE) ×2 IMPLANT
SUT TIGER TAPE 7 IN WHITE (SUTURE) IMPLANT
SUTURE TAPE 1.3 40 TPR END (SUTURE) IMPLANT
SUTURE TAPE TIGERLINK 1.3MM BL (SUTURE) IMPLANT
SUTURETAPE 1.3 40 TPR END (SUTURE) ×2
SUTURETAPE TIGERLINK 1.3MM BL (SUTURE) ×4
TAPE FIBER 2MM 7IN #2 BLUE (SUTURE) ×2 IMPLANT
TAPE PAPER 3X10 WHT MICROPORE (GAUZE/BANDAGES/DRESSINGS) ×2 IMPLANT
TOWEL OR 17X24 6PK STRL BLUE (TOWEL DISPOSABLE) ×2 IMPLANT
TOWEL OR 17X26 10 PK STRL BLUE (TOWEL DISPOSABLE) ×2 IMPLANT
WAND HAND CNTRL MULTIVAC 90 (MISCELLANEOUS) ×2 IMPLANT
WATER STERILE IRR 1000ML POUR (IV SOLUTION) ×2 IMPLANT

## 2017-02-27 NOTE — Transfer of Care (Signed)
Immediate Anesthesia Transfer of Care Note  Patient: Kathleen [name]  Procedure(s) Performed: Right shoulder arthroscopic rotator cuff repair with biceps tenodesis and subacromial decompression (Right Shoulder)  Patient Location: PACU  Anesthesia Type:GA combined with regional for post-op pain  Level of Consciousness: awake and patient cooperative  Airway & Oxygen Therapy: Patient Spontanous Breathing and Patient connected to face mask oxygen  Post-op Assessment: Report given to RN and Post -op Vital signs reviewed and stable  Post vital signs: Reviewed and stable  Last Vitals:  Vitals:   02/27/17 1153 02/27/17 1620  BP: (!) 146/61 (!) 158/98  Pulse: 70 97  Resp: 20 17  Temp: 36.6 C (!) 36.1 C  SpO2: 97% 99%    Last Pain:  Vitals:   02/27/17 1620  TempSrc:   PainSc: 0-No pain         Complications: No apparent anesthesia complications

## 2017-02-27 NOTE — Anesthesia Procedure Notes (Signed)
Anesthesia Regional Block: Supraclavicular block   Pre-Anesthetic Checklist: ,, timeout performed, Correct Patient, Correct Site, Correct Laterality, Correct Procedure, Correct Position, site marked, Risks and benefits discussed,  Surgical consent,  Pre-op evaluation,  At surgeon's request and post-op pain management  Laterality: Right and Upper  Prep: Maximum Sterile Barrier Precautions used, chloraprep       Needles:  Injection technique: Single-shot  Needle Type: Echogenic Stimulator Needle     Needle Length: 10cm      Additional Needles:   Procedures:,,,, ultrasound used (permanent image in chart),,,,  Narrative:  Start time: 02/27/2017 1:13 PM End time: 02/27/2017 1:23 PM Injection made incrementally with aspirations every 5 mL.  Performed by: Personally  Anesthesiologist: Montez Hageman, MD  Additional Notes: Risks, benefits and alternative to block explained extensively.  Patient tolerated procedure well, without complications.

## 2017-02-27 NOTE — Brief Op Note (Signed)
02/27/2017  4:01 PM  PATIENT:  Kathleen [name]  62 y.o. female  PRE-OPERATIVE DIAGNOSIS:  Right shoulder rotator cuff tear, biceps tear  POST-OPERATIVE DIAGNOSIS:  Right shoulder rotator cuff tear, biceps tear  PROCEDURE:  Procedure(s) with comments: Right shoulder arthroscopic rotator cuff repair with biceps tenodesis and subacromial decompression (Right) - 150 mins  SURGEON:  Surgeon(s) and Role:    * Nicholes Stairs, MD - Primary  PHYSICIAN ASSISTANT:   ASSISTANTS: none   ANESTHESIA:   regional and general  EBL:  20 ml  BLOOD ADMINISTERED:none  DRAINS: none   LOCAL MEDICATIONS USED:  none  SPECIMEN:  No Specimen  DISPOSITION OF SPECIMEN:  N/A  COUNTS:  YES  TOURNIQUET:  * No tourniquets in log *  DICTATION: .Dragon Dictation  PLAN OF CARE: Admit for overnight observation  PATIENT DISPOSITION:  PACU - hemodynamically stable.   Delay start of Pharmacological VTE agent (>24hrs) due to surgical blood loss or risk of bleeding: not applicable
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
## 2017-02-27 NOTE — Progress Notes (Signed)
Orthopedic Tech Progress Note Patient Details:  Kathleen [name] [birth year] [id number]  Ortho Devices Type of Ortho Device: Shoulder abduction pillow Ortho Device/Splint Location: Drop off       [name] 02/27/2017, 3:55 PM

## 2017-02-27 NOTE — Anesthesia Preprocedure Evaluation (Signed)
Anesthesia Evaluation  Patient identified by MRN, date of birth, ID band Patient awake    Reviewed: Allergy & Precautions, NPO status , Patient's Chart, lab work & pertinent test results  History of Anesthesia Complications (+) PONV  Airway Mallampati: II  TM Distance: >3 FB Neck ROM: Full    Dental   Pulmonary shortness of breath, asthma , sleep apnea , pneumonia,    breath sounds clear to auscultation       Cardiovascular hypertension, Pt. on medications + CAD, + Past MI, + Peripheral Vascular Disease and +CHF   Rhythm:Regular Rate:Normal     Neuro/Psych  Headaches, Anxiety Depression Bipolar Disorder    GI/Hepatic negative GI ROS, Neg liver ROS,   Endo/Other  diabetes, Type 2, Oral Hypoglycemic AgentsMorbid obesity  Renal/GU Renal disease     Musculoskeletal   Abdominal   Peds  Hematology   Anesthesia Other Findings Echo 12/17  - Left ventricle: The cavity size was normal. Systolic function was   normal. The estimated ejection fraction was in the range of 55%   to 60%. Wall motion was normal; there were no regional wall   motion abnormalities. Doppler parameters are consistent with   abnormal left ventricular relaxation (grade 1 diastolic   dysfunction). - Aortic valve: Trileaflet; mildly thickened, mildly calcified   leaflets. - Left atrium: The atrium was mildly dilated. Anterior-posterior   dimension: 45 mm.  Reproductive/Obstetrics                             Anesthesia Physical  Anesthesia Plan  ASA: III  Anesthesia Plan: General   Post-op Pain Management: GA combined w/ Regional for post-op pain   Induction: Intravenous  PONV Risk Score and Plan: 3 and Ondansetron, Dexamethasone, Midazolam and Scopolamine patch - Pre-op  Airway Management Planned: Oral ETT  Additional Equipment:   Intra-op Plan:   Post-operative Plan:   Informed Consent: I have reviewed the  patients History and Physical, chart, labs and discussed the procedure including the risks, benefits and alternatives for the proposed anesthesia with the patient or authorized representative who has indicated his/her understanding and acceptance.   Dental advisory given  Plan Discussed with: CRNA and Anesthesiologist  Anesthesia Plan Comments:         Anesthesia Quick Evaluation

## 2017-02-27 NOTE — Op Note (Signed)
Date of Surgery: 02/27/2017  INDICATIONS: [name] is a 62 y.o.-year-old female with a right Shoulder subscapularis tear with associated long head biceps tendon subluxation and split tearing.  She sustained an acute injury following a fall that resulted in this right shoulder injury.  She was indicated for arthroscopic intervention with fixation of the subscapularis as well as biceps tenodesis and subacromial decompression.  She had acute increase in pain and inability to forward elevate following that fall and injury.  We have discussed the risks, benefits, and indications of this procedure at length.  She did provide informed consent to proceed with surgery.;  The patient did consent to the procedure after discussion of the risks and benefits.  PREOPERATIVE DIAGNOSIS:  1.  Right shoulder subscapularis (rotator cuff) full-thickness tear, traumatic. 2.  Long head biceps tendinopathy 3.  Right shoulder subacromial impingement 4.  Right shoulder glenohumeral arthritis  POSTOPERATIVE DIAGNOSIS: Same.  PROCEDURE:  1.  Right shoulder arthroscopic rotator cuff repair 2.  Right shoulder arthroscopic biceps tenodesis 3.  Right shoulder arthroscopic extensive debridement including glenoid fossa, rotator cuff interval, anterior labrum, posterior labrum, superior labrum, subacromial space. 4.  Subacromial decompression including coracoacromial ligament resection.  SURGEON: Geralynn Rile, M.D.  ASSIST: none.  ANESTHESIA:  general, and interscalene block  IV FLUIDS AND URINE: See anesthesia.  ESTIMATED BLOOD LOSS: 20 mL.  IMPLANTS: Arthrex 4.75 mm swivel lock anchor 1  DRAINS: none  COMPLICATIONS: None.  DESCRIPTION OF PROCEDURE: The patient was brought to the operating room and placed Supine on the operating table.  The patient had been signed prior to the procedure and this was documented. The patient had the anesthesia placed by the anesthesiologist.  A time-out was performed to confirm that  this was the correct patient, site, side and location. The patient did receive antibiotics prior to the incision and was re-dosed during the procedure as needed at indicated intervals.  A tourniquet Was not placed.  The patient had the operative extremity prepped and draped in the standard surgical fashion.      After obtaining informed consent the patient was brought to the operating table and underwent satisfactory anesthesia. An exam under anesthesia revealed full range of motion. She was placed in the lateral decubitus position with an axillary roll and all bony prominences properly padded. A standard surgical timeout was performed. She was placed in 10 pounds of gentle in-line suspension.  Standard posterior and anterior superior portals were established. A diagnostic evaluation of the glenohumeral joint was performed. The biceps tendon was markedly synovitic and partially torn. It was noted to be subluxated medially into the subscapularis tear.  At this point we made a determination to tenodesed the biceps to get out of the footprint of the subscapularis tendon.  We performed a double half racking suture construct to control the tendon.  Following that the tendon was tenotomized off of the superior labrum.  The suture was retained for later tenodesis.  We then continued with the diagnostic portion of the intra-articular joint this demonstrated intact teres minor, infraspinatus and supraspinatus tendons.  The subscapularis tendon had a border full-thickness tear with retraction medially.  The lower half of the subscapular stent was intact.  The capsule was very inflamed and synovitic.  The biceps tendon was also synovitic and inflamed.  The anterior, superior, posterior labrum was degenerative lead torn.  The humeral head straight today area of grade 4 arthritic changes on the superior portion of this correspond with an area of grade  4 changes in superior half of the glenoid.    An extensive  debridement was performed of the superior anterior and posterior labrum. The glenoid fossa, humeral head was then abraded using a chondroplasty technique with the motorized shaver.  We smoothed the uneven surfaces of the articular humeral head and articular glenoid surface. Furthermore the rotator interval was widely debrided using RF ablation device as well as motorized shaver.  We next turned our attention to the subscapularis repair.  We performed 270 of releases with the radio frequency ablation device.  This included the anterior surface, superior surface and posterior surface.  Once we had adequately released a traction stitch was placed into the subscapular tendon via the anterior superolateral portal.  Once this was in place we mobilized the tendon to its footprint.  A horizontal mattress suture was placed utilizing a free suture tape.  Next we prepared an area off the superior border of the subscapularis tendon at its footprint for placement of an anchor.  We prepared that with an awl.  We then loaded the 4.75 mm swivel lock anchor with the biceps tendon suture as well as the subscapularis sutures.  This was screwed into place after reducing the subscapular and biceps tendons.  Photographs were taken demonstrating adequate biceps tenodesis as well as subscapularis fixation.  We then moved the arthroscope to the subacromial space.  This demonstrated extensive subacromial bursitis with no appearance of any rotator cuff pathology.  After release of the CA ligament we then performed a subacromial decompression.  The type II acromium was debrided and burred back to a smooth flat undersurface consistent with a type I acromion.  Hemostasis was achieved with electrocautery.  We were satisfied with the subacromial decompression and the case was concluded.  The arthroscope was then removed and portals closed with 4-0 Monocryl in standard fashion followed by a sterile occlusive dressing Polar Care ice sleeve  and a slingshot sling. The patient was sent to recovery in stable condition and tolerated the procedure well.  All counts were correct 2.  There were no immediate postoperative complications.  POSTOPERATIVE PLAN:  The patient was transferred to PACU in stable condition.  She will maintain her abduction sling for 6 weeks postoperatively.  She will be admitted postoperatively for observation and close monitoring of her respiratory status given her complex medical morbidities.  She will discharge home tomorrow with 2 week follow-up with myself.  We will restart her Xarelto postoperatively for DVT prophylaxis.

## 2017-02-27 NOTE — Anesthesia Procedure Notes (Signed)
Procedure Name: Intubation Date/Time: 02/27/2017 2:23 PM Performed by: Lance Coon, CRNA Pre-anesthesia Checklist: Patient identified, Emergency Drugs available, Suction available, Patient being monitored and Timeout performed Patient Re-evaluated:Patient Re-evaluated prior to induction Oxygen Delivery Method: Circle system utilized Preoxygenation: Pre-oxygenation with 100% oxygen Induction Type: IV induction Ventilation: Mask ventilation without difficulty Laryngoscope Size: Miller and 3 Grade View: Grade II Tube type: Oral Tube size: 7.0 mm Number of attempts: 1 Airway Equipment and Method: Stylet Placement Confirmation: ETT inserted through vocal cords under direct vision,  positive ETCO2 and breath sounds checked- equal and bilateral Secured at: 22 cm Tube secured with: Tape Dental Injury: Teeth and Oropharynx as per pre-operative assessment

## 2017-02-27 NOTE — Anesthesia Postprocedure Evaluation (Signed)
Anesthesia Post Note  Patient: Kathleen [name]  Procedure(s) Performed: Right shoulder arthroscopic rotator cuff repair with biceps tenodesis and subacromial decompression (Right Shoulder)     Patient location during evaluation: PACU Anesthesia Type: General Level of consciousness: awake and alert Pain management: pain level controlled Vital Signs Assessment: post-procedure vital signs reviewed and stable Respiratory status: spontaneous breathing, nonlabored ventilation, respiratory function stable and patient connected to nasal cannula oxygen Cardiovascular status: blood pressure returned to baseline and stable Postop Assessment: no apparent nausea or vomiting Anesthetic complications: no    Last Vitals:  Vitals:   02/27/17 1642 02/27/17 1718  BP: (!) 145/78 (!) 173/92  Pulse: 90 91  Resp: 15   Temp: 36.7 C 36.8 C  SpO2: 93% 93%    Last Pain:  Vitals:   02/27/17 1718  TempSrc: Oral  PainSc:                  Xiong Haidar DAVID

## 2017-02-28 ENCOUNTER — Encounter (HOSPITAL_COMMUNITY): Payer: Self-pay | Admitting: Orthopedic Surgery

## 2017-02-28 DIAGNOSIS — M75121 Complete rotator cuff tear or rupture of right shoulder, not specified as traumatic: Secondary | ICD-10-CM | POA: Diagnosis not present

## 2017-02-28 LAB — GLUCOSE, CAPILLARY
GLUCOSE-CAPILLARY: 164 mg/dL — AB (ref 65–99)
Glucose-Capillary: 153 mg/dL — ABNORMAL HIGH (ref 65–99)

## 2017-02-28 MED ORDER — RIVAROXABAN 20 MG PO TABS: 20.0000 mg | ORAL_TABLET | Freq: Every day | ORAL | Status: DC

## 2017-02-28 NOTE — Progress Notes (Signed)
   Subjective:  Patient reports pain as mild.  Block is wearing off and she has no complaints.  Working with OT now.  Objective:   VITALS:   Vitals:   02/27/17 2148 02/28/17 0146 02/28/17 0619 02/28/17 1100  BP: 137/67 (!) 142/74 (!) 147/85 122/81  Pulse: 98 90 83 78  Resp: 16 18 18 18   Temp: 97.8 F (36.6 C) 98.7 F (37.1 C) 98 F (36.7 C) 97.6 F (36.4 C)  TempSrc: Oral Oral Oral Oral  SpO2: 95% 93% 95% 93%  Weight:        Neurologically intact Sensation intact distally Intact pulses distally Incision: dressing C/D/I Block is off and no deficits  Lab Results  Component Value Date   WBC 4.9 02/23/2017   HGB 15.0 02/23/2017   HCT 43.9 02/23/2017   MCV 86.6 02/23/2017   PLT 121 (L) 02/23/2017   BMET    Component Value Date/Time   NA 140 02/23/2017 0848   K 4.1 02/23/2017 0848   CL 103 02/23/2017 0848   CO2 25 02/23/2017 0848   GLUCOSE 109 (H) 02/23/2017 0848   BUN 15 02/23/2017 0848   CREATININE 0.83 02/23/2017 0848   CREATININE 0.84 10/02/2014 1553   CALCIUM 9.3 02/23/2017 0848   GFRNONAA >60 02/23/2017 0848   GFRAA >60 02/23/2017 0848     Assessment/Plan: 1 Day Post-Op   Principal Problem:   Traumatic rotator cuff tear, right, initial encounter Active Problems:   CAD (coronary artery disease)   Morbid obesity (HCC)   Complete rotator cuff tear or rupture of right shoulder, not specified as traumatic   [facility] diet OT has worked with patient and she is ready to DC home.  Dc to home today Resume home meds including xarelto.  Follow up with Stann Mainland in 2 weeks.    Nicholes Stairs 02/28/2017, 12:07 PM   Geralynn Rile, MD [phone]

## 2017-02-28 NOTE — Evaluation (Addendum)
Occupational Therapy Evaluation Patient Details Name: Kathleen [name] MRN: [id number] DOB: [birth year] Today's Date: 02/28/2017    History of Present Illness Pt is a 62 y.o. female s/p R shoulder arthroscopic rotator cuff repair; R shoulder arthroscopic biceps tenodesis; R shoulder arthroscopic extensive debridement including glenoid fossa, rotator cuff interval, anterior labrum, posterior labrum, superior labrum, subacromial space; and subacromical decompression coracoacromial ligament resection. PMH significant for anemia, anxiety, asthma, atrial fibrillation, bipolar disorder, bronchitis, cancer, CHF, headache, hyperlipidemia, hypothyroidism, hypertension, pneumonia, PONV, urinary incontinence.   Clinical Impression   PTA, pt was independent with ADL and functional mobility. She is a retired [employer] account executive and maintains an active lifestyle. She reports living on her own but planning to D/C to her friend's home who is able to provide 24 hour assistance. Educated pt concerning post-operative shoulder protocol per MD order including sling wear at all times other than exercise and dressing/bathing, wrist and hand AROM, cervical AROM, elbow PROM with limitations in extension (no full extension; 10-150 degrees PROM only), and pendulum exercises. Pt additionally educated concerning compensatory strategies for ADL and she requires max assist for UB and LB dressing/bathing, min assist for toileting hygiene/clothing management, and min assist for grooming tasks. Pt slightly unsteady when ambulating and requiring min guard assist for toilet transfers. Pt attentive throughout education but would benefit from continued reinforcement.   OT returned for second visit when caregiver present prior to D/C. Continued education concerning positioning limitations, sling wear, and HEP as described above and below. Pt continues to require significant assistance for ADL and caregiver demonstrating ability to provide the  necessary assistance. Provided cues during sling donning/doffing as well as therapeutic exercise. Pt would benefit from continued OT services while admitted to improve independence and safety with ADL prior to D/C. Note plan for D/C today and pt left with handouts concerning all information. She reports no further questions or concerns. OT will continue to follow while admitted.     Follow Up Recommendations  DC plan and follow up therapy as arranged by surgeon;Supervision/Assistance - 24 hour    Equipment Recommendations  None recommended by OT    Recommendations for Other Services       Precautions / Restrictions Precautions Precautions: Fall;Shoulder Type of Shoulder Precautions: Demonstrate taking sling on and off; at shoulder ok for pendulum and scapular retractions; at elbow ok for ROM either AROM or PROM from 10-full flexion (avoid full passive extension); hand and wrist at tolerated; sling at all times. Shoulder Interventions: Shoulder sling/immobilizer;At all times;Off for dressing/bathing/exercises Precaution Booklet Issued: Yes (comment) Precaution Comments: Extensively reviewed shoulder protocol and HEP.  Restrictions Weight Bearing Restrictions: Yes RUE Weight Bearing: Non weight bearing      Mobility Bed Mobility Overal bed mobility: Needs Assistance Bed Mobility: Supine to Sit     Supine to sit: Supervision     General bed mobility comments: Educated on recommendation to exit bed on non-operative side.   Transfers Overall transfer level: Needs assistance Equipment used: None Transfers: Sit to/from Stand Sit to Stand: Min guard         General transfer comment: Min guard assist for safety.     Balance Overall balance assessment: Needs assistance Sitting-balance support: No upper extremity supported;Feet supported Sitting balance-Leahy Scale: Fair     Standing balance support: No upper extremity supported;During functional activity Standing  balance-Leahy Scale: Fair  ADL either performed or assessed with clinical judgement   ADL Overall ADL's : Needs assistance/impaired Eating/Feeding: Set up;Sitting   Grooming: Minimal assistance;Sitting   Upper Body Bathing: Maximal assistance;Sitting;With caregiver independent assisting   Lower Body Bathing: Maximal assistance;Sit to/from stand;With caregiver independent assisting   Upper Body Dressing : Maximal assistance;Standing;With caregiver independent assisting   Lower Body Dressing: Maximal assistance;With caregiver independent assisting;Sit to/from stand   Toilet Transfer: Min guard;Ambulation;Regular Toilet;Grab bars   Toileting- Clothing Manipulation and Hygiene: Sit to/from stand;Minimal assistance       Functional mobility during ADLs: Min guard General ADL Comments: Pt educated extensively concerning ADL including sling wear and schedule with handout provided. Returned for second session with caregiver present who demonstrates understanding of all topics.      Vision Patient Visual Report: No change from baseline Vision Assessment?: No apparent visual deficits     Perception     Praxis      Pertinent Vitals/Pain Pain Assessment: Faces Faces Pain Scale: Hurts little more Pain Location: R shoulder Pain Descriptors / Indicators: Aching;Guarding;Sore;Operative site guarding;Tingling Pain Intervention(s): Limited activity within patient's tolerance;Monitored during session;Repositioned     Hand Dominance     Extremity/Trunk Assessment Upper Extremity Assessment Upper Extremity Assessment: RUE deficits/detail RUE Deficits / Details: diminished sensation and strength in R UE due to nerve block; improved during second session RUE: Unable to fully assess due to pain;Unable to fully assess due to immobilization RUE Sensation: decreased light touch RUE Coordination: decreased fine motor;decreased gross motor   Lower  Extremity Assessment Lower Extremity Assessment: Generalized weakness       Communication Communication Communication: No difficulties   Cognition Arousal/Alertness: Awake/alert Behavior During Therapy: WFL for tasks assessed/performed Overall Cognitive Status: Within Functional Limits for tasks assessed                                     General Comments       Exercises Exercises: Shoulder Shoulder Exercises Pendulum Exercise: Right;10 reps(both sessions in all planes) Elbow Flexion: PROM;Right;10 reps(limited to 10 deg from full extension (10-150 degrees range)) Elbow Extension: PROM;Right;10 reps(limited to -10 degrees extension (10-150 degrees range)) Wrist Flexion: AROM;Right;10 reps(both sessions) Wrist Extension: AROM;Right;10 reps(2 sessions) Digit Composite Flexion: AROM;10 reps(2 sessions) Composite Extension: AROM;Right;10 reps(2 sessions)   Shoulder Instructions Shoulder Instructions Donning/doffing shirt without moving shoulder: Maximal assistance;Caregiver independent with task Method for sponge bathing under operated UE: Maximal assistance;Caregiver independent with task Donning/doffing sling/immobilizer: Maximal assistance;Caregiver independent with task Correct positioning of sling/immobilizer: Caregiver independent with task;Maximal assistance Pendulum exercises (written home exercise program): Supervision/safety ROM for elbow, wrist and digits of operated UE: Supervision/safety Sling wearing schedule (on at all times/off for ADL's): Supervision/safety;Caregiver independent with task Proper positioning of operated UE when showering: Minimal assistance;Caregiver independent with task Positioning of UE while sleeping: Minimal assistance    Home Living Family/patient expects to be discharged to:: Private residence Living Arrangements: Alone Available Help at Discharge: Friend(s);Available 24 hours/day Type of Home: House Home Access: Stairs to  enter [employer] of Steps: 1         Bathroom Shower/Tub: Occupational psychologist: Standard     Home Equipment: Grab bars - tub/shower;Shower seat   Additional Comments: Planning to discharge to friends home.       Prior Functioning/Environment Level of Independence: Independent  OT Problem List: Decreased strength;Decreased activity tolerance;Impaired balance (sitting and/or standing);Decreased safety awareness;Decreased knowledge of use of DME or AE;Decreased knowledge of precautions;Decreased range of motion;Pain;Impaired UE functional use      OT Treatment/Interventions: Self-care/ADL training;Therapeutic exercise;Neuromuscular education;Energy conservation;DME and/or AE instruction;Manual therapy;Modalities;Splinting;Therapeutic activities;Patient/family education;Balance training    OT Goals(Current goals can be found in the care plan section) Acute Rehab OT Goals Patient Stated Goal: get back to daily activities OT Goal Formulation: With patient Time For Goal Achievement: 03/14/17 Potential to Achieve Goals: Good ADL Goals Pt Will Perform Grooming: (P) with modified independence;standing Pt Will Perform Upper Body Dressing: (P) with min assist;with caregiver independent in assisting;sitting Pt Will Perform Lower Body Dressing: (P) with min assist;with caregiver independent in assisting;sit to/from stand Pt Will Transfer to Toilet: (P) with modified independence;ambulating;regular height toilet Pt/caregiver will Perform Home Exercise Program: (P) Right Upper extremity;With written HEP provided;With Supervision(per MD protocol)  OT Frequency: Min 2X/week   Barriers to D/C:            Co-evaluation              AM-PAC PT "6 Clicks" Daily Activity     Outcome Measure Help from another person eating meals?: A Little Help from another person taking care of personal grooming?: A Lot Help from another person toileting,  which includes using toliet, bedpan, or urinal?: A Lot Help from another person bathing (including washing, rinsing, drying)?: A Lot Help from another person to put on and taking off regular upper body clothing?: A Lot Help from another person to put on and taking off regular lower body clothing?: A Lot 6 Click Score: 13   End of Session Equipment Utilized During Treatment: Other (comment)(R shoulder sling) Nurse Communication: Mobility status  Activity Tolerance: Patient tolerated treatment well Patient left: in chair;with call bell/phone within reach;with family/visitor present  OT Visit Diagnosis: Unsteadiness on feet (R26.81);Other abnormalities of gait and mobility (R26.89);Muscle weakness (generalized) (M62.81)                Time: 1000-1037; 3235-5732 OT Time Calculation (min): 74 min Charges:  OT General Charges $OT Visit: 2 Visits OT Evaluation $OT Eval Moderate Complexity: 1 Mod OT Treatments $Self Care/Home Management : 38-52 mins $Therapeutic Exercise: 23-37 mins G-Codes:     Norman Herrlich, MS OTR/L  Pager: [place] A Decarla Siemen 02/28/2017, 2:16 PM

## 2017-03-08 ENCOUNTER — Ambulatory Visit: Payer: BC Managed Care – PPO | Admitting: Family Medicine

## 2017-03-12 ENCOUNTER — Telehealth: Payer: Self-pay | Admitting: *Deleted

## 2017-03-12 NOTE — Telephone Encounter (Signed)
Routing to providers to see if it is okay to transfer from [place] to [name]  Copied from [place] [phone]. Topic: Appointment Scheduling - Scheduling Inquiry for Clinic >> Mar 12, 2017 10:16 AM Aurelio Brash B wrote: Reason for CRM: Pt wants to change PCP  from Dr [name] at [facility]  to Dr Lorelei Pont at [facility] because  the [facility] office is closer to her.

## 2017-03-12 NOTE — Telephone Encounter (Signed)
Ok to transfer.  Please give her my best 

## 2017-03-12 NOTE — Telephone Encounter (Signed)
ok 

## 2017-03-14 ENCOUNTER — Ambulatory Visit: Payer: BC Managed Care – PPO | Admitting: Family Medicine

## 2017-03-20 ENCOUNTER — Ambulatory Visit: Payer: BC Managed Care – PPO | Admitting: Psychology

## 2017-03-20 DIAGNOSIS — F3181 Bipolar II disorder: Secondary | ICD-10-CM | POA: Diagnosis not present

## 2017-03-22 ENCOUNTER — Other Ambulatory Visit: Payer: Self-pay | Admitting: Internal Medicine

## 2017-03-27 ENCOUNTER — Other Ambulatory Visit: Payer: Self-pay | Admitting: Family Medicine

## 2017-03-28 NOTE — Progress Notes (Signed)
Kathleen [name] [address] [place], [place] [address] [phone]  Date:  03/29/2017   Name:  Kathleen [name]   DOB:  [birth year]   MRN:  [id number]  PCP:  Kathleen Mclean, MD    Chief Complaint: Establish Care (Former pt of Dr. Birdie [name] here to est care. c/o bilateral hand tremors x 3 yrs, father had Parkinson's. Tremors have worsened, making writing and eating more difficult. )   History of Present Illness:  Kathleen [name] is a 62 y.o. very pleasant female patient who presents with the following:  Former pt of Dr. Birdie [name], last seen at her office in January to follow-up on her HTN  Here today with her friend Kathleen [name] who is also my patient; she is staying with her while her shoulder is healing  She has right rotator cuff operation done 5 weeks ago per Dr. Stann [name]  She is right handed, still needs a lot of help with her ADLs, etc.   She has CHF, is a pt of Kathleen [name].  Had an MI several years ago, and a fib dx about 3 years ago  Per most recent CHF clinic note:  1. Chronic diastolic HF:  ECHO 06/9483 EF Normal EF 55-60% Grade IDD  Volume status stable. Continue current diuretic regimen.   2. PAF. 1st noted during hospital admit 8/16. CHADSVASC = 3. Regular pulse.  Continue current bb and xarelto dose. No bleeding problems  3. Morbid obesity: Discussed portion control. 4. CAD:  - Continue ASA 81, Plavix, statin.   No CP.  5. HTN:   Stable.  6. Snoring:  - No OSA on sleep study.  7. Hyperparathyroidism-   - s/p partial parathyroidectomy 08/2014. Per PCP.   Lab Results  Component Value Date   HGBA1C 6.0 (H) 02/23/2017   She is on metformin 500 BID- while she was getting her operation there was concern about her blood sugars being high, but otherwise this has not been an issue.  She does not have a meter, has never really needed to check her glucose in the past as her A1c have been well controlled as above  She  has noted significant tremor in her hands- this has been getting worse and now causes difficulty for her eating.    So far she has tried cogentin for her tremor and it did make her a bit better, but has not made a huge difference   She has been seen by Kathleen [name] who did NOT think that she had Parkinson's disease or another specific movement disorder- per her note from 11/18: Long talk with the patient today.  She wondered if Latuda could be the cause of tremor and I told her that while [place] can be a common cause of tremor, it usually produces some degree of rest tremor and I did not see that in her today.  I did tell her that Depakote (used for her bipolar) can cause an action tremor and may be a slight source of tremor in her, but she has been on the same dose of Depakote for what she estimates is 10 years.  States that VPA is checked by Dr. Ronnald [name] and last level was 75 in the summer.  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  Her tremor changes amplitude and frequency during the examination.  We had a long discussion about nature and pathophysiology of psychogenic tremor.  We discussed  that this is not the same as malingering and this often occurs in patients who have suffered some type of abuse in their past.  We discussed that this is not something that she made up, but also discussed that this is not something that responds to oral medications.  We discussed that the treatment is extensive counseling.  She admits that she has had a very "stressful life" and her primary care physician also recommended counseling.  She has the name of counselors, but has not yet gone.  I encouraged her to contact the [employer] counselors.  We discussed that the longer one goes with untreated psychogenic tremor, the more poor is the prognosis for recovery.  We discussed the fact that there are functional movement disorder centers that treat this as an inpatient, but I really think she needs intensive  outpatient treatment first.  She will follow up here as needed.    She has noted a skin lesion in her central forehead- there for a month or a little more.  Seems to be increasing in size quickly.  She recently had a mohs procedure for a basal cell on her left cheek per her report   Wt Readings from Last 3 Encounters:  03/29/17 (!) 319 lb 9.6 oz (145 kg)  02/27/17 (!) 325 lb (147.4 kg)  02/23/17 (!) 325 lb (147.4 kg)     Patient Active Problem List   Diagnosis Date Noted  . Complete rotator cuff tear or rupture of right shoulder, not specified as traumatic 02/27/2017  . Auditory hallucination 12/13/2016  . Tremor of both hands 11/20/2016  . Gout 06/29/2016  . Type 2 diabetes mellitus ([place]) 12/31/2015  . Chest pain with moderate risk for cardiac etiology 12/29/2015  . Coronary artery dissection   . Pure hypercholesterolemia   . Lung nodule seen on imaging study 09/24/2014  . Paroxysmal atrial fibrillation ([place]) 09/11/2014  . Asthmatic bronchitis with exacerbation 05/25/2014  . Primary hyperparathyroidism ([place]) 04/15/2014  . Palpitations 03/20/2014  . Radicular low back pain 11/24/2013  . Chronic diastolic heart failure ([place]) 10/02/2013  . Shortness of breath 08/31/2013  . Venous stasis dermatitis of both lower extremities 08/19/2013  . Peripheral edema 06/12/2013  . Other malaise and fatigue 06/12/2013  . Nocturia 06/12/2013  . Cellulitis of leg, right 10/11/2012  . Morbid obesity ([place]) 08/09/2012  . Urinary incontinence, urge 07/11/2012  . CAD (coronary artery disease) 10/31/2010  . NEOPLASM OF UNCERTAIN BEHAVIOR OF SKIN 02/11/2010  . Bipolar disorder ([place]) 09/14/2009  . ACUT MYOCARD INFARCT UNS SITE SUBSQT EPIS CARE 07/02/2008  . EDEMA 06/23/2008  . Hyperlipidemia 05/23/2008  . UNSPECIFIED DISORDER OF THYROID 02/04/2008  . Obesity 02/04/2008  . HYPERTENSION, BENIGN ESSENTIAL 02/04/2008  . MYOCARDIAL INFARCTION, HX OF 03/05/2007    Past Medical History:  Diagnosis  Date  . Anemia   . Anxiety   . Asthma   . Atrial fibrillation ([place])   . Bipolar disorder ([place])   . Bronchitis    hx of   . Cancer ([place])    basal cell carcinoma on right hand, papilloma left breast  . CHF (congestive heart failure) ([place])    pt seen at heart/vascular spec clinic 08/14/2014   . Coarse tremors    hands  . Coronary artery disease   . Depression   . DVT (deep vein thrombosis) in pregnancy ([place])    pt. reports that it was post knee surgery, not during pregnancy  . Dyslipidemia   . Dysrhythmia  A fib  . Fall   . Fatty liver   . Headache    migraines - stopped after menopause  . Heart attack ([place]) 02/2007   non-q-wave with second septal perforator  . Hyperlipidemia   . Hypertension   . Hypothyroidism    history of took medication, has resolved  . Knee pain, left   . Lower extremity deep venous thrombosis (H[facility])    20 years ago   . Measles    hx of in childhood   . Morbid obesity with BMI of 40.0-44.9, adult ([place])   . Pain at surgical incision    Left breast from procedure 09/11/16  . Pneumonia    hx of   . PONV (postoperative nausea and vomiting)    also slow to wake up  . Psychiatric hospitalization 05/2008  . Shingles    20 years ago   . Shortness of breath dyspnea    exercise   . Type 2 diabetes mellitus ([place]) 12/31/2015  . Urinary incontinence   . Urinary tract bacterial infections     Past Surgical History:  Procedure Laterality Date  . BASAL CELL CARCINOMA EXCISION    . BREAST LUMPECTOMY WITH RADIOACTIVE SEED LOCALIZATION Left 09/11/2016   Procedure: LEFT BREAST LUMPECTOMY WITH RADIOACTIVE SEED LOCALIZATION;  Surgeon: Excell Seltzer, MD;  Location: [facility];  Service: General;  Laterality: Left;  . BREAST MASS EXCISION     age 24, benign tumor  . CARDIAC CATHETERIZATION    . CARDIAC CATHETERIZATION N/A 12/30/2015   Procedure: Left Heart Cath and Coronary Angiography;  Surgeon: Belva Crome, MD;  Location: [facility];  Service:  Cardiovascular;  Laterality: N/A;  . COLONOSCOPY    . DILATATION & CURETTAGE/HYSTEROSCOPY WITH MYOSURE N/A 09/22/2016   Procedure: DILATATION & CURETTAGE/HYSTEROSCOPY WITH MYOSURE;  Surgeon: Molli Posey, MD;  Location: [facility];  Service: Gynecology;  Laterality: N/A;  . DILATION AND CURETTAGE OF UTERUS    . KNEE SURGERY     right, removed cartilage  . PARATHYROIDECTOMY N/A 08/25/2014   Procedure: PARATHYROIDECTOMY;  Surgeon: Armandina Gemma, MD;  Location: [facility];  Service: General;  Laterality: N/A;  . SHOULDER ARTHROSCOPY WITH ROTATOR CUFF REPAIR AND SUBACROMIAL DECOMPRESSION Right 02/27/2017   Procedure: Right shoulder arthroscopic rotator cuff repair with biceps tenodesis and subacromial decompression;  Surgeon: Nicholes Stairs, MD;  Location: [facility];  Service: Orthopedics;  Laterality: Right;  150 mins  . TONSILLECTOMY    . TUBAL LIGATION      Social History   Tobacco Use  . Smoking status: Never Smoker  . Smokeless tobacco: Never Used  Substance Use Topics  . Alcohol use: Yes    Alcohol/week: 0.0 oz    Comment: occ.  . Drug use: No    Family History  Problem Relation Age of Onset  . Breast cancer Mother 75  . Alcohol abuse Mother   . Alzheimer's disease Mother   . Alcohol abuse Father   . Alzheimer's disease Father   . Parkinson's disease Father   . Alcohol abuse Maternal Grandfather   . Drug abuse Maternal Grandfather   . Alcohol abuse Maternal Grandmother   . Alcohol abuse Paternal Grandfather   . Alcohol abuse Paternal Grandmother   . Schizophrenia Cousin   . Diabetes Brother   . Hypertension Brother     Allergies  Allergen Reactions  . Eggs Or Egg-Derived Products Hives and Other (See Comments)    Can eat foods with cooked eggs; CANNOT handle when  part of flu vaccine, etc.   . Lamictal [Lamotrigine] Hives and Rash    Medication list has been reviewed and updated.  Current Outpatient Medications on File Prior to Visit  Medication Sig Dispense Refill  .  albuterol (PROVENTIL HFA;VENTOLIN HFA) 108 (90 BASE) MCG/ACT inhaler Inhale 2 puffs into the lungs every 6 (six) hours as needed for wheezing. 1 Inhaler 1  . allopurinol (ZYLOPRIM) 100 MG tablet TAKE 2 TABLETS BY MOUTH EVERY DAY (Patient taking differently: TAKE 200 MG BY MOUTH EVERY DAY) 60 tablet 6  . ALPRAZolam (XANAX) 0.5 MG tablet Take 0.5 mg by mouth 2 (two) times daily as needed for anxiety.    . benztropine (COGENTIN) 1 MG tablet Take 1 mg by mouth 2 (two) times daily.  2  . divalproex (DEPAKOTE ER) 500 MG 24 hr tablet Take 1,500 mg by mouth at bedtime.     . furosemide (LASIX) 80 MG tablet TAKE 1 TAB DAILY. MAY ALSO TAKE 1/2 TAB AS NEEDED FOR FLUID. PLEASE CALL [phone] FOR FOLLOW UP. 45 tablet 0  . LATUDA 60 MG TABS Take 60 mg by mouth at bedtime.     [name] lisinopril (PRINIVIL,ZESTRIL) 20 MG tablet Take 1 tablet (20 mg total) by mouth daily. (Patient taking differently: Take 20 mg by mouth at bedtime. ) 30 tablet 3  . metFORMIN (GLUCOPHAGE) 500 MG tablet TAKE 1 TABLET (500 MG TOTAL) BY MOUTH 2 (TWO) TIMES DAILY WITH A MEAL. 60 tablet 3  . metoprolol succinate (TOPROL-XL) 50 MG 24 hr tablet TAKE 1 TABLET BY MOUTH DAILY WITH OR IMMEDIATELY FOLLOWING A MEAL (Patient taking differently: TAKE 50 MG BY MOUTH DAILY WITH OR IMMEDIATELY FOLLOWING A MEAL) 90 tablet 3  . nitroGLYCERIN (NITROSTAT) 0.4 MG SL tablet Place 1 tablet (0.4 mg total) under the tongue every 5 (five) minutes as needed for chest pain. For chest pain 25 tablet 3  . oxyCODONE (ROXICODONE) 5 MG immediate release tablet Take 1-2 tablets (5-10 mg total) by mouth every 6 (six) hours as needed for moderate pain or severe pain. 30 tablet 0  . rosuvastatin (CRESTOR) 20 MG tablet TAKE 1 TABLET BY MOUTH EVERY DAY (Patient taking differently: TAKE 20 MG BY MOUTH EVERY DAY) 30 tablet 5  . traMADol (ULTRAM) 50 MG tablet Take 50 mg by mouth every 6 (six) hours as needed for moderate pain.    [name] triamcinolone ointment (KENALOG) 0.1 % APPLY TO  AFFECTED AREA TWICE A DAY FOR 14 DAYS (Patient taking differently: Apply 1 application topically 2 (two) times daily as needed (for dry skin). ) 30 g 1  . XARELTO 20 MG TABS tablet TAKE 1 TABLET BY MOUTH EVERY DAY WITH SUPPER (Patient taking differently: Take 20 mg by mouth at bedtime) 30 tablet 11   No current facility-administered medications on file prior to visit.     Review of Systems:  As per HPI- otherwise negative. No fever or chills No other specific neurological sx except for her hand tremor    Physical Examination: Vitals:   03/29/17 1546  BP: 124/86  Pulse: 82  Temp: (!) 97.4 F (36.3 C)  SpO2: 98%   Vitals:   03/29/17 1546  Weight: (!) 319 lb 9.6 oz (145 kg)  Height: 5\' 11"  (1.803 m)   Body mass index is 44.58 kg/m. Ideal Body Weight: Weight in (lb) to have BMI = 25: 178.9  GEN: WDWN, NAD, Non-toxic, A & O x 3, obese, looks well  HEENT: Atraumatic, Normocephalic. Neck supple. No  masses, No LAD. Central forehead displays an approx 1cm diameter raised macule.  ?basal cell Ears and Nose: No external deformity. CV: RRR, No M/G/R. No JVD. No thrill. No extra heart sounds. PULM: CTA B, no wheezes, crackles, rhonchi. No retractions. No resp. distress. No accessory muscle use. ABD: S, NT, ND, +BS. No rebound. No HSM. EXTR: No c/c/e NEURO Normal gait.  She does have bilateral hand tremor which is worse with intentional movement of hands PSYCH: Normally interactive. Conversant. Not depressed or anxious appearing.  Calm demeanor.  Right arm in a sling from recent [facility] repair  Results for orders placed or performed in visit on 03/29/17  POCT Glucose (Device for Home Use)  Result Value Ref Range   Glucose Fasting, POC  70 - 99 mg/dL   POC Glucose 160 (A) 70 - 99 mg/dl    Assessment and Plan: Type 2 diabetes mellitus without complication, unspecified whether long term insulin use ([place]) - Plan: POCT Glucose (Device for Home Use)  HYPERTENSION, BENIGN  ESSENTIAL  Chronic diastolic heart failure (HCC)  BP is well controlled and weight is stable, no sign of fluid overload.  Lasix prn Given a glucose meter and did teaching for how to use meter.  She will come in for an A1c in 2 months BP is under good control, lisinopril   Message to pt: Hi Kathleen [name]- I wanted to check in with your about your tremor. I wondered if you might want to try going up on your cogentin, as it has helped you some and we are able to go up some on the dose.  We have other medication options, but as you are on a fair number of medications we do need to consider side effects.  Let me know what you think-

## 2017-03-29 ENCOUNTER — Encounter: Payer: Self-pay | Admitting: Family Medicine

## 2017-03-29 ENCOUNTER — Ambulatory Visit: Payer: BC Managed Care – PPO | Admitting: Family Medicine

## 2017-03-29 VITALS — BP 124/86 | HR 82 | Temp 97.4°F | Ht 71.0 in | Wt 319.6 lb

## 2017-03-29 DIAGNOSIS — I5032 Chronic diastolic (congestive) heart failure: Secondary | ICD-10-CM | POA: Diagnosis not present

## 2017-03-29 DIAGNOSIS — E119 Type 2 diabetes mellitus without complications: Secondary | ICD-10-CM

## 2017-03-29 DIAGNOSIS — I10 Essential (primary) hypertension: Secondary | ICD-10-CM

## 2017-03-29 LAB — POCT GLUCOSE (DEVICE FOR HOME USE): POC Glucose: 160 mg/dl — AB (ref 70–99)

## 2017-03-29 NOTE — Patient Instructions (Signed)
I am going to do some research into your temor options- will call you  Please see your derm about the skin lesion on your forehead Please see me in about 2 months for an A1c check In the meantime you can check your bloc sugar on occasion

## 2017-03-31 ENCOUNTER — Encounter: Payer: Self-pay | Admitting: Family Medicine

## 2017-04-03 ENCOUNTER — Other Ambulatory Visit: Payer: Self-pay | Admitting: Family Medicine

## 2017-04-04 ENCOUNTER — Other Ambulatory Visit (HOSPITAL_COMMUNITY): Payer: Self-pay | Admitting: *Deleted

## 2017-04-04 ENCOUNTER — Telehealth (HOSPITAL_COMMUNITY): Payer: Self-pay | Admitting: *Deleted

## 2017-04-04 MED ORDER — METOPROLOL TARTRATE 25 MG PO TABS: 25.0000 mg | ORAL_TABLET | Freq: Two times a day (BID) | ORAL | 3 refills | Status: DC

## 2017-04-04 NOTE — Telephone Encounter (Signed)
Received request from [facility] pharmacy stating patient would like to switch Toprol XL to a less expensive medication.  Per Dr. [name] patient may switch to Metoprolol tartrate 25 mg BID.  Medication sent to pharmacy.

## 2017-04-09 ENCOUNTER — Ambulatory Visit: Payer: BC Managed Care – PPO | Admitting: Psychology

## 2017-04-12 IMAGING — DX DG SHOULDER 2+V*L*
3 series · 3 of 3 positions shown · non-contrast
Comparison: Chest x-ray of 06/19/2012

CLINICAL DATA: Left shoulder pain, fell 2 weeks ago, limited range
of motion

EXAM:
LEFT SHOULDER - 2+ VIEW

[shoulder grashey]
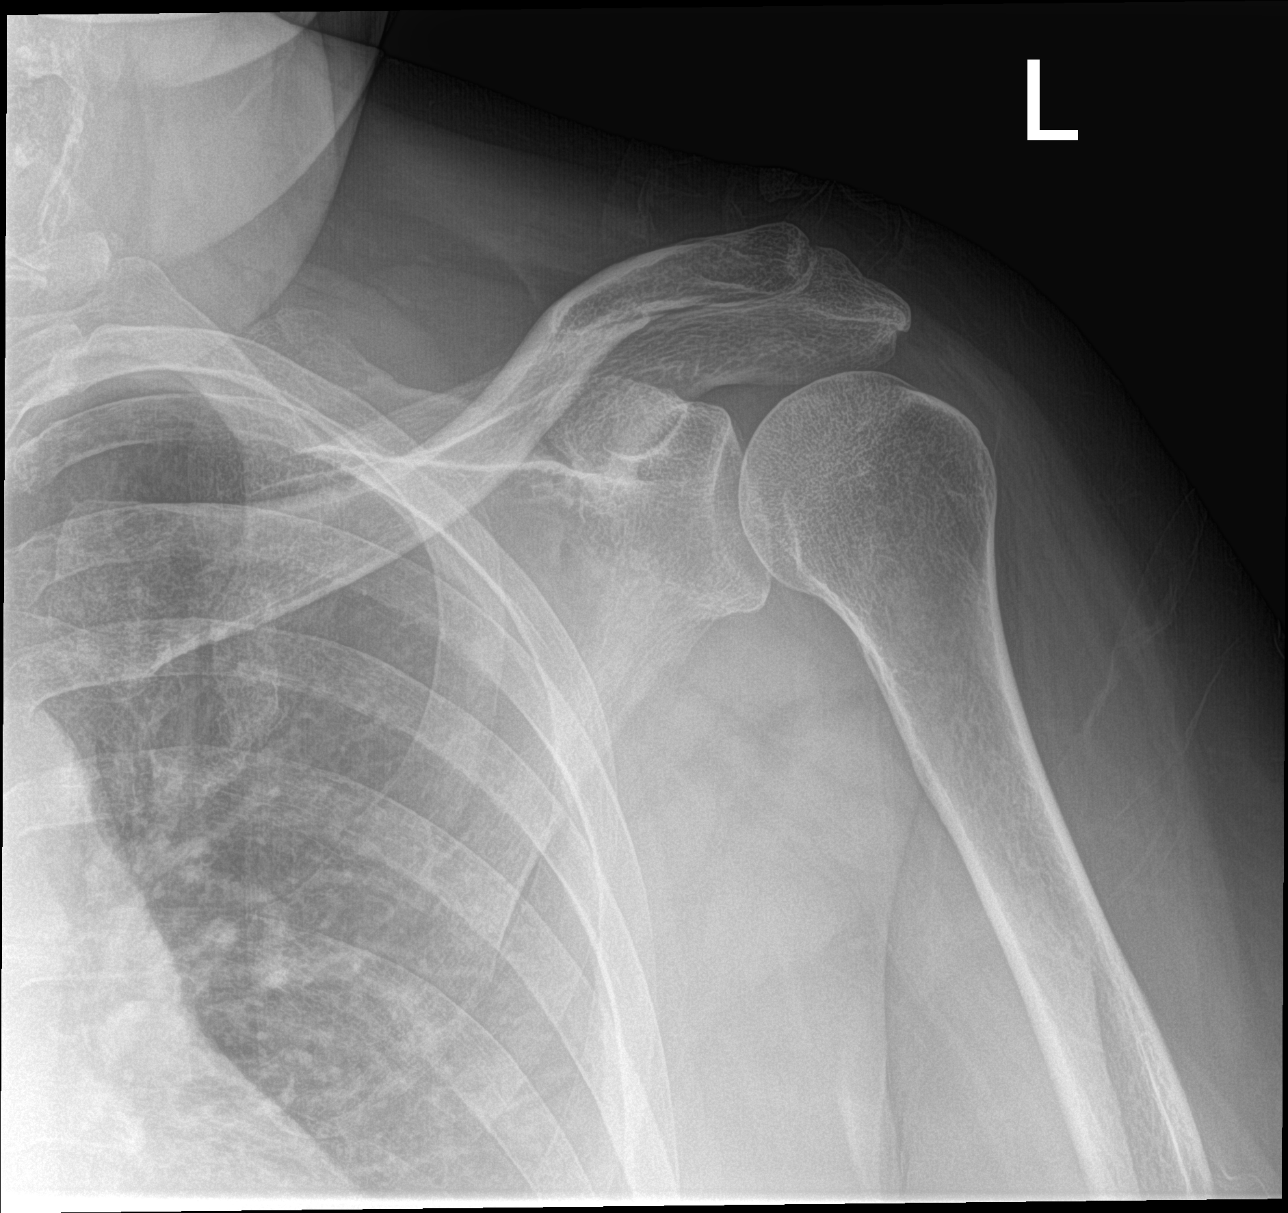

[shoulder y view]
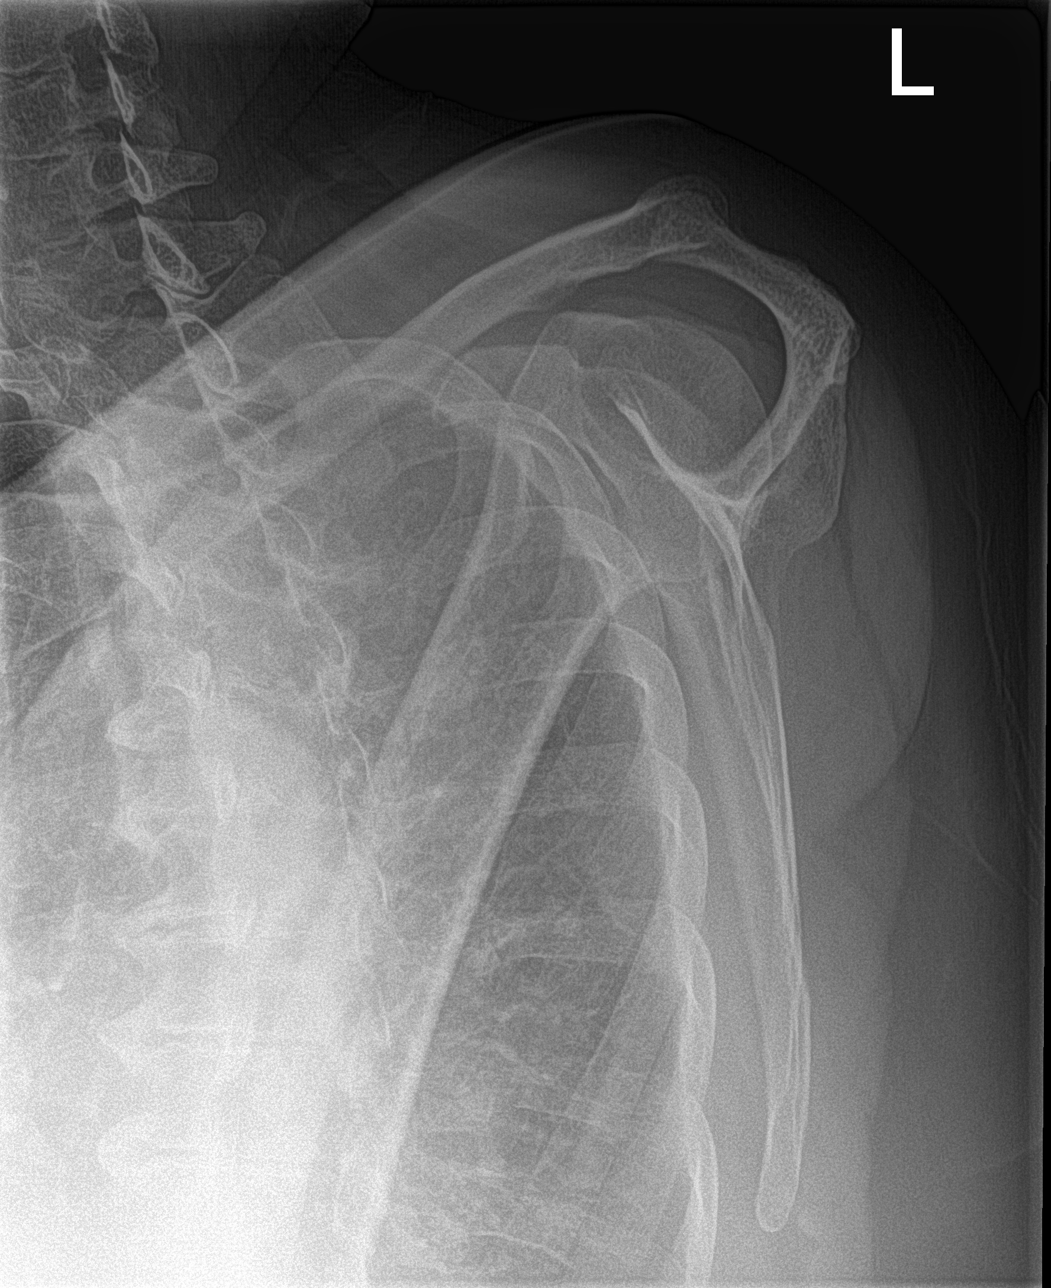

[shoulder ap neutral]
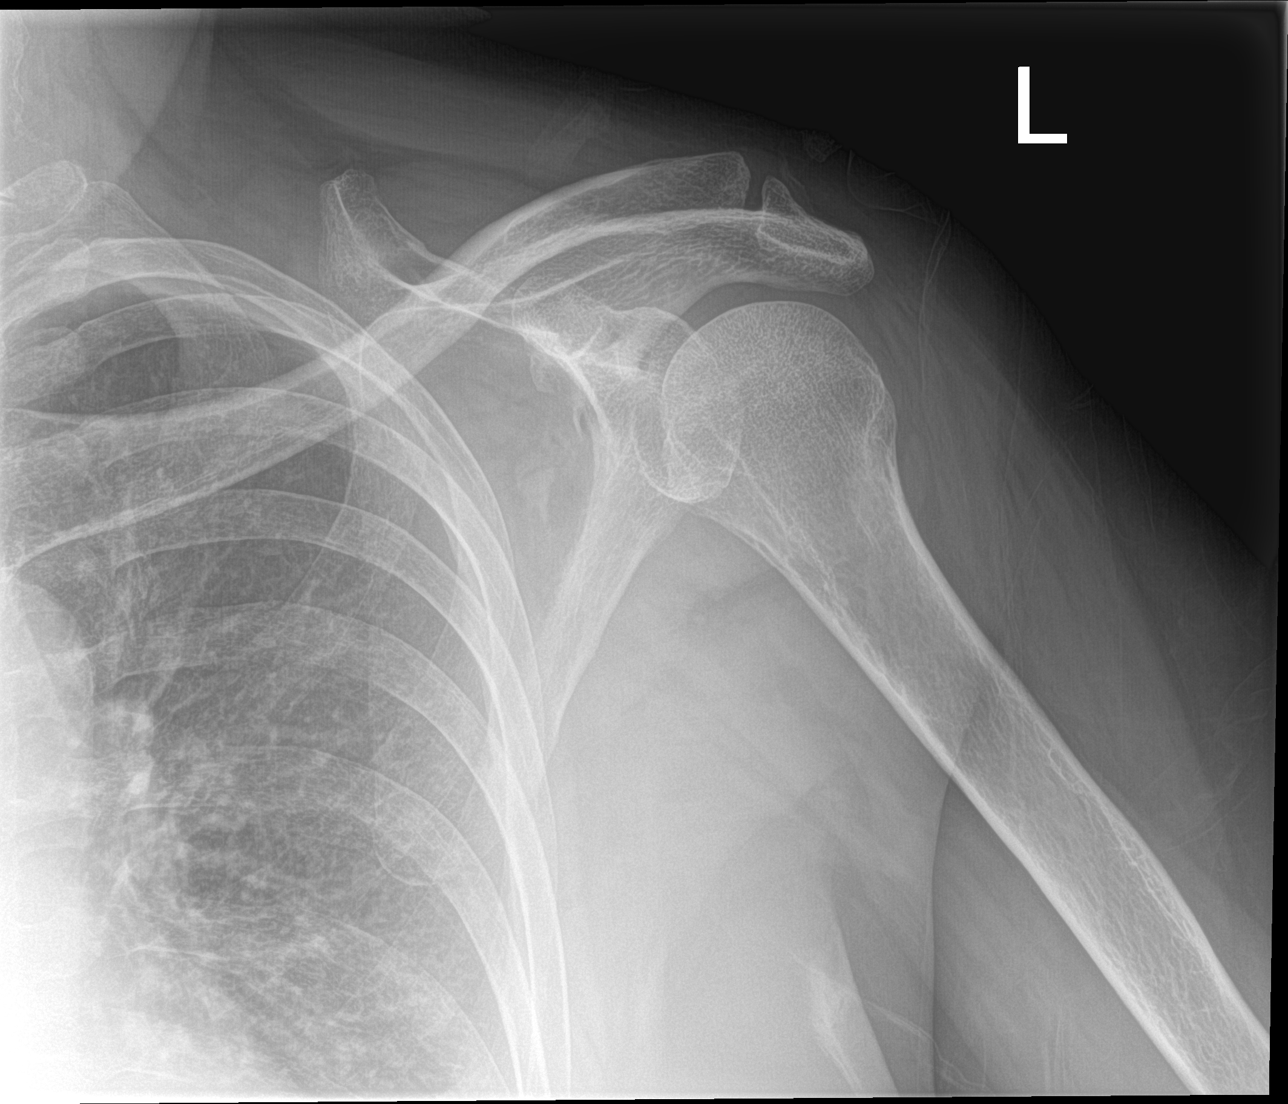

[3 of 3 positions shown; findings below may reference images not displayed]

FINDINGS: The left humeral head is in normal position and the glenohumeral
joint space is unremarkable. Minimal acromial spurring is present.
IMPRESSION: No acute abnormality.  Minimal acromial spurring

## 2017-05-03 ENCOUNTER — Other Ambulatory Visit: Payer: Self-pay | Admitting: Obstetrics and Gynecology

## 2017-05-03 DIAGNOSIS — Z1231 Encounter for screening mammogram for malignant neoplasm of breast: Secondary | ICD-10-CM

## 2017-05-25 ENCOUNTER — Ambulatory Visit
Admission: RE | Admit: 2017-05-25 | Discharge: 2017-05-25 | Disposition: A | Discharge status: Home / Self Care | Payer: BC Managed Care – PPO | Source: Ambulatory Visit | Attending: Obstetrics and Gynecology | Admitting: Obstetrics and Gynecology

## 2017-05-25 DIAGNOSIS — Z1231 Encounter for screening mammogram for malignant neoplasm of breast: Secondary | ICD-10-CM

## 2017-05-28 ENCOUNTER — Other Ambulatory Visit: Payer: Self-pay | Admitting: Obstetrics and Gynecology

## 2017-05-28 DIAGNOSIS — N644 Mastodynia: Secondary | ICD-10-CM

## 2017-05-29 ENCOUNTER — Other Ambulatory Visit: Payer: Self-pay | Admitting: Obstetrics and Gynecology

## 2017-05-29 DIAGNOSIS — N6001 Solitary cyst of right breast: Secondary | ICD-10-CM

## 2017-05-29 DIAGNOSIS — N644 Mastodynia: Secondary | ICD-10-CM

## 2017-05-29 NOTE — Progress Notes (Addendum)
[place] at [facility] [address], [place], [place] [address] [phone]  Date:  05/31/2017   Name:  Kathleen [name]   DOB:  Jan 04, 1956   MRN:  [id number]  PCP:  Darreld Mclean, MD    Chief Complaint: Diabetes (2 month follow up)   History of Present Illness:  Kathleen [name] is a 62 y.o. very pleasant female patient who presents with the following:  Follow-up visit today History of DM2, a fib, obesity, HTN, MI/ CAD  Lab Results  Component Value Date   HGBA1C 6.0 (H) 02/23/2017   She is still in PT for her shoulder- she is still going twice a week  From our last visit in February:  Here today with her friend Abigail Butts who is also my patient; she is staying with her while her shoulder is healing She has right rotator cuff operation done 5 weeks ago per Dr. Stann Mainland  She is right handed, still needs a lot of help with her ADLs, etc.  She has CHF, is a pt of Dr. Jeffie Pollock.  Had an MI several years ago, and a fib dx about 3 years ago  Per most recent CHF clinic note: 1. Chronic diastolic HF:  ECHO 04/9700 EF Normal EF 55-60% Grade IDD Volume status stable. Continue current diuretic regimen. 2. PAF. 1st noted during hospital admit 8/16. CHADSVASC = 3. Regular pulse. Continue current bb and xarelto dose. No bleeding problems  3. Morbid obesity: Discussed portion control. 4. CAD:  - Continue ASA 81, Plavix, statin. No CP. 5. HTN: Stable. 6. Snoring:  - No OSA on sleep study.  7. Hyperparathyroidism-  - s/p partial parathyroidectomy 08/2014. Per PCP.  RecentLabs       Lab Results  Component Value Date   HGBA1C 6.0 (H) 02/23/2017     She is on metformin 500 BID- while she was getting her operation there was concern about her blood sugars being high, but otherwise this has not been an issue.  She does not have a meter, has never really needed to check her glucose in the past as her A1c have been well  controlled as above ---------------------------------------------------------------------------------------------------------------------------------------- Needs an A1c today   BP Readings from Last 3 Encounters:  05/31/17 128/80  03/29/17 124/86  02/28/17 122/81   She is back home now which is a bit of a challenge as she has to do everything on her own - she has a hard time making her bed and using vacuum  She is driving again which is going ok  She has a glucose meter and needs help learning how to sue it  She does not really check her glucose at home but we don't think there will be an issue - well controlled in the past  m[facility]o will [facility]   Patient Active Problem List   Diagnosis Date Noted  . Complete rotator cuff tear or rupture of right shoulder, not specified as traumatic 02/27/2017  . Auditory hallucination 12/13/2016  . Tremor of both hands 11/20/2016  . Gout 06/29/2016  . Type 2 diabetes mellitus ([place]) 12/31/2015  . Chest pain with moderate risk for cardiac etiology 12/29/2015  . Coronary artery dissection   . Pure hypercholesterolemia   . Lung nodule seen on imaging study 09/24/2014  . Paroxysmal atrial fibrillation ([place]) 09/11/2014  . Asthmatic bronchitis with exacerbation 05/25/2014  . Primary hyperparathyroidism ([place]) 04/15/2014  . Palpitations 03/20/2014  .  Radicular low back pain 11/24/2013  . Chronic diastolic heart failure ([place]) 10/02/2013  . Shortness of breath 08/31/2013  . Venous stasis dermatitis of both lower extremities 08/19/2013  . Peripheral edema 06/12/2013  . Other malaise and fatigue 06/12/2013  . Nocturia 06/12/2013  . Cellulitis of leg, right 10/11/2012  . Morbid obesity ([place]) 08/09/2012  . Urinary incontinence, urge 07/11/2012  . CAD (coronary artery disease) 10/31/2010  . NEOPLASM OF UNCERTAIN BEHAVIOR OF SKIN 02/11/2010  . Bipolar disorder ([place]) 09/14/2009  . ACUT MYOCARD INFARCT UNS SITE SUBSQT EPIS CARE  07/02/2008  . EDEMA 06/23/2008  . Hyperlipidemia 05/23/2008  . UNSPECIFIED DISORDER OF THYROID 02/04/2008  . Obesity 02/04/2008  . HYPERTENSION, BENIGN ESSENTIAL 02/04/2008  . MYOCARDIAL INFARCTION, HX OF 03/05/2007    Past Medical History:  Diagnosis Date  . Anemia   . Anxiety   . Asthma   . Atrial fibrillation ([place])   . Bipolar disorder ([place])   . Bronchitis    hx of   . Cancer ([place])    basal cell carcinoma on right hand, papilloma left breast  . CHF (congestive heart failure) ([place])    pt seen at heart/vascular spec clinic 08/14/2014   . Coarse tremors    hands  . Coronary artery disease   . Depression   . DVT (deep vein thrombosis) in pregnancy ([place])    pt. reports that it was post knee surgery, not during pregnancy  . Dyslipidemia   . Dysrhythmia    A fib  . Fall   . Fatty liver   . Headache    migraines - stopped after menopause  . Heart attack ([place]) 02/2007   non-q-wave with second septal perforator  . Hyperlipidemia   . Hypertension   . Hypothyroidism    history of took medication, has resolved  . Knee pain, left   . Lower extremity deep venous thrombosis (HCC)    20 years ago   . Measles    hx of in childhood   . Morbid obesity with BMI of 40.0-44.9, adult ([place])   . Pain at surgical incision    Left breast from procedure 09/11/16  . Pneumonia    hx of   . PONV (postoperative nausea and vomiting)    also slow to wake up  . Psychiatric hospitalization 05/2008  . Shingles    20 years ago   . Shortness of breath dyspnea    exercise   . Type 2 diabetes mellitus ([place]) 12/31/2015  . Urinary incontinence   . Urinary tract bacterial infections     Past Surgical History:  Procedure Laterality Date  . BASAL CELL CARCINOMA EXCISION    . BREAST LUMPECTOMY WITH RADIOACTIVE SEED LOCALIZATION Left 09/11/2016   Procedure: LEFT BREAST LUMPECTOMY WITH RADIOACTIVE SEED LOCALIZATION;  Surgeon: Excell Seltzer, MD;  Location: [facility];  Service: General;  Laterality:  Left;  . BREAST MASS EXCISION     age 46, benign tumor  . CARDIAC CATHETERIZATION    . CARDIAC CATHETERIZATION N/A 12/30/2015   Procedure: Left Heart Cath and Coronary Angiography;  Surgeon: Belva Crome, MD;  Location: [facility];  Service: Cardiovascular;  Laterality: N/A;  . COLONOSCOPY    . DILATATION & CURETTAGE/HYSTEROSCOPY WITH MYOSURE N/A 09/22/2016   Procedure: DILATATION & CURETTAGE/HYSTEROSCOPY WITH MYOSURE;  Surgeon: Molli Posey, MD;  Location: [facility];  Service: Gynecology;  Laterality: N/A;  . DILATION AND CURETTAGE OF UTERUS    . KNEE SURGERY     right,  removed cartilage  . PARATHYROIDECTOMY N/A 08/25/2014   Procedure: PARATHYROIDECTOMY;  Surgeon: Armandina Gemma, MD;  Location: [facility];  Service: General;  Laterality: N/A;  . SHOULDER ARTHROSCOPY WITH ROTATOR CUFF REPAIR AND SUBACROMIAL DECOMPRESSION Right 02/27/2017   Procedure: Right shoulder arthroscopic rotator cuff repair with biceps tenodesis and subacromial decompression;  Surgeon: Nicholes Stairs, MD;  Location: [facility];  Service: Orthopedics;  Laterality: Right;  150 mins  . TONSILLECTOMY    . TUBAL LIGATION      Social History   Tobacco Use  . Smoking status: Never Smoker  . Smokeless tobacco: Never Used  Substance Use Topics  . Alcohol use: Yes    Alcohol/week: 0.0 oz    Comment: occ.  . Drug use: No    Family History  Problem Relation Age of Onset  . Breast cancer Mother 27  . Alcohol abuse Mother   . Alzheimer's disease Mother   . Alcohol abuse Father   . Alzheimer's disease Father   . Parkinson's disease Father   . Alcohol abuse Maternal Grandfather   . Drug abuse Maternal Grandfather   . Alcohol abuse Maternal Grandmother   . Alcohol abuse Paternal Grandfather   . Alcohol abuse Paternal Grandmother   . Schizophrenia Cousin   . Diabetes Brother   . Hypertension Brother     Allergies  Allergen Reactions  . Eggs Or Egg-Derived Products Hives and Other (See Comments)    Can eat  foods with cooked eggs; CANNOT handle when part of flu vaccine, etc.   . Lamictal [Lamotrigine] Hives and Rash    Medication list has been reviewed and updated.  Current Outpatient Medications on File Prior to Visit  Medication Sig Dispense Refill  . albuterol (PROVENTIL HFA;VENTOLIN HFA) 108 (90 BASE) MCG/ACT inhaler Inhale 2 puffs into the lungs every 6 (six) hours as needed for wheezing. 1 Inhaler 1  . allopurinol (ZYLOPRIM) 100 MG tablet TAKE 2 TABLETS BY MOUTH EVERY DAY (Patient taking differently: TAKE 200 MG BY MOUTH EVERY DAY) 60 tablet 6  . benztropine (COGENTIN) 1 MG tablet Take 1 mg by mouth 2 (two) times daily.  2  . divalproex (DEPAKOTE ER) 500 MG 24 hr tablet Take 1,500 mg by mouth at bedtime.     . furosemide (LASIX) 80 MG tablet TAKE 1 TAB DAILY. MAY ALSO TAKE 1/2 TAB AS NEEDED FOR FLUID. PLEASE CALL [phone] FOR FOLLOW UP. 45 tablet 0  . LATUDA 60 MG TABS Take 60 mg by mouth at bedtime.     [name] lisinopril (PRINIVIL,ZESTRIL) 20 MG tablet TAKE 1 TABLET BY MOUTH EVERY DAY 90 tablet 3  . metFORMIN (GLUCOPHAGE) 500 MG tablet TAKE 1 TABLET (500 MG TOTAL) BY MOUTH 2 (TWO) TIMES DAILY WITH A MEAL. 60 tablet 3  . metoprolol tartrate (LOPRESSOR) 25 MG tablet Take 1 tablet (25 mg total) by mouth 2 (two) times daily. (Patient taking differently: Take 50 mg by mouth daily. ) 180 tablet 3  . nitroGLYCERIN (NITROSTAT) 0.4 MG SL tablet Place 1 tablet (0.4 mg total) under the tongue every 5 (five) minutes as needed for chest pain. For chest pain 25 tablet 3  . oxyCODONE (ROXICODONE) 5 MG immediate release tablet Take 1-2 tablets (5-10 mg total) by mouth every 6 (six) hours as needed for moderate pain or severe pain. 30 tablet 0  . rosuvastatin (CRESTOR) 20 MG tablet TAKE 1 TABLET BY MOUTH EVERY DAY (Patient taking differently: TAKE 20 MG BY MOUTH EVERY DAY) 30 tablet 5  .  triamcinolone ointment (KENALOG) 0.1 % APPLY TO AFFECTED AREA TWICE A DAY FOR 14 DAYS (Patient taking differently: Apply 1  application topically 2 (two) times daily as needed (for dry skin). ) 30 g 1  . UNABLE TO FIND Med Name: One Touch Verio blood glucose meter Lot #[id number] x Exp 01/22/2018    . XARELTO 20 MG TABS tablet TAKE 1 TABLET BY MOUTH EVERY DAY WITH SUPPER (Patient taking differently: Take 20 mg by mouth at bedtime) 30 tablet 11  . ALPRAZolam (XANAX) 0.5 MG tablet Take 0.5 mg by mouth 2 (two) times daily as needed for anxiety.     No current facility-administered medications on file prior to visit.     Review of Systems:  As per HPI- otherwise negative.  Wt Readings from Last 3 Encounters:  05/31/17 (!) 313 lb (142 kg)  03/29/17 (!) 319 lb 9.6 oz (145 kg)  02/27/17 (!) 325 lb (147.4 kg)      Physical Examination: Vitals:   05/31/17 0848  BP: 128/80  Pulse: 80  Resp: 16  SpO2: 98%   Vitals:   05/31/17 0848  Weight: (!) 313 lb (142 kg)  Height: 5\' 11"  (1.803 m)   Body mass index is 43.65 kg/m. Ideal Body Weight: Weight in (lb) to have BMI = 25: 178.9  GEN: WDWN, NAD, Non-toxic, A & O x 3, obese, looks well  HEENT: Atraumatic, Normocephalic. Neck supple. No masses, No LAD. Ears and Nose: No external deformity. CV: RRR, No M/G/R. No JVD. No thrill. No extra heart sounds. PULM: CTA B, no wheezes, crackles, rhonchi. No retractions. No resp. distress. No accessory muscle use. EXTR: No c/c/ trace edema of both LE NEURO Normal gait.  PSYCH: Normally interactive. Conversant. Not depressed or anxious appearing.  Calm demeanor.  Her right shoulder ROM is about 75% of normal at this time    Assessment and Plan: Type 2 diabetes mellitus without complication, unspecified whether long term insulin use ([place]) - Plan: Hemoglobin A1c, CANCELED: Hemoglobin A1c, CANCELED: Hemoglobin A1c  HYPERTENSION, BENIGN ESSENTIAL - Plan: CBC  Chronic diastolic heart failure (HCC) - Plan: Lipid panel  Edema, unspecified type  Edema is minimal- she plans to start mall walking for exercise which we hope  will help A1c today BP is under fine control Went over how to use her glucose meter and demonstrated use for her today   Signed Lamar Blinks, MD  Received labs   Your labs look great!  Diabetes is under very good control cholesterol looks great  Platelet count is still slightly low but closer to normal than prior   Good news- please see me in 4-6 months for a recheck visit  Results for orders placed or performed in visit on 05/31/17  CBC  Result Value Ref Range   WBC 5.4 4.0 - 10.5 K/uL   RBC 4.98 3.87 - 5.11 Mil/uL   Platelets 141.0 (L) 150.0 - 400.0 K/uL   Hemoglobin 15.0 12.0 - 15.0 g/dL   HCT 44.4 36.0 - 46.0 %   MCV 89.3 78.0 - 100.0 fl   MCHC 33.8 30.0 - 36.0 g/dL   RDW 16.1 (H) 11.5 - 15.5 %  Lipid panel  Result Value Ref Range   Cholesterol 129 0 - 200 mg/dL   Triglycerides 129.0 0.0 - 149.0 mg/dL   HDL 44.20 >39.00 mg/dL   VLDL 25.8 0.0 - 40.0 mg/dL   LDL Cholesterol 59 0 - 99 mg/dL   Total CHOL/HDL Ratio 3    NonHDL  84.45   Hemoglobin A1c  Result Value Ref Range   Hgb A1c MFr Bld 6.0 4.6 - 6.5 %

## 2017-05-31 ENCOUNTER — Encounter: Payer: Self-pay | Admitting: Family Medicine

## 2017-05-31 ENCOUNTER — Ambulatory Visit
Admission: RE | Admit: 2017-05-31 | Discharge: 2017-05-31 | Disposition: A | Discharge status: Home / Self Care | Payer: BC Managed Care – PPO | Source: Ambulatory Visit | Attending: Obstetrics and Gynecology | Admitting: Obstetrics and Gynecology

## 2017-05-31 ENCOUNTER — Ambulatory Visit: Payer: BC Managed Care – PPO | Admitting: Family Medicine

## 2017-05-31 ENCOUNTER — Ambulatory Visit: Payer: Self-pay | Admitting: Family Medicine

## 2017-05-31 VITALS — BP 128/80 | HR 80 | Resp 16 | Ht 71.0 in | Wt 313.0 lb

## 2017-05-31 DIAGNOSIS — N6001 Solitary cyst of right breast: Secondary | ICD-10-CM

## 2017-05-31 DIAGNOSIS — R609 Edema, unspecified: Secondary | ICD-10-CM | POA: Diagnosis not present

## 2017-05-31 DIAGNOSIS — I5032 Chronic diastolic (congestive) heart failure: Secondary | ICD-10-CM | POA: Diagnosis not present

## 2017-05-31 DIAGNOSIS — N644 Mastodynia: Secondary | ICD-10-CM

## 2017-05-31 DIAGNOSIS — I10 Essential (primary) hypertension: Secondary | ICD-10-CM

## 2017-05-31 DIAGNOSIS — E119 Type 2 diabetes mellitus without complications: Secondary | ICD-10-CM

## 2017-05-31 LAB — CBC
HCT: 44.4 % (ref 36.0–46.0)
Hemoglobin: 15 g/dL (ref 12.0–15.0)
MCHC: 33.8 g/dL (ref 30.0–36.0)
MCV: 89.3 fl (ref 78.0–100.0)
Platelets: 141 10*3/uL — ABNORMAL LOW (ref 150.0–400.0)
RBC: 4.98 Mil/uL (ref 3.87–5.11)
RDW: 16.1 % — ABNORMAL HIGH (ref 11.5–15.5)
WBC: 5.4 10*3/uL (ref 4.0–10.5)

## 2017-05-31 LAB — LIPID PANEL
CHOLESTEROL: 129 mg/dL (ref 0–200)
HDL: 44.2 mg/dL (ref >39.00)
LDL Cholesterol: 59 mg/dL (ref 0–99)
NonHDL: 84.45
TRIGLYCERIDES: 129 mg/dL (ref 0.0–149.0)
Total CHOL/HDL Ratio: 3
VLDL: 25.8 mg/dL (ref 0.0–40.0)

## 2017-05-31 LAB — HEMOGLOBIN A1C: Hgb A1c MFr Bld: 6 % (ref 4.6–6.5)

## 2017-05-31 NOTE — Patient Instructions (Signed)
It was good to see you today!  Take care and I will be in touch with your labs  Assuming your A1c is not much higher we can plan to visit in 4-6 months Continue to work on exercise by walking

## 2017-06-13 NOTE — Progress Notes (Signed)
[place] at [facility] [place], [place], [place] [address] [phone]  Date:  06/14/2017   Name:  Kathleen [name]   DOB:  [birth year]   MRN:  [id number]  PCP:  Darreld Mclean, MD    Chief Complaint: Leg Infection (spot on left leg, discharge, redness, 3 weeks)   History of Present Illness:  Kathleen [name] is a 62 y.o. very pleasant female patient who presents with the following:  Seen by myself earlier this month History of DM, a fib, venous stasis/ edema, MI  She developed some skin lesion on her left anterior thigh about 3 weeks ago after she sat in the sun- they seemed to start like vesicles and have improved but not resolved.  She was starting to get worried about them and though there may be some infection. The area around the lesions is tender No other skin lesions She otherwise feels well  She tries some neosporin but this did not really seem to help   Lab Results  Component Value Date   HGBA1C 6.0 05/31/2017   No fever or chills Tremor persists Dm is under good control as above Her shoulder function continues to get better   Patient Active Problem List   Diagnosis Date Noted  . Complete rotator cuff tear or rupture of right shoulder, not specified as traumatic 02/27/2017  . Auditory hallucination 12/13/2016  . Tremor of both hands 11/20/2016  . Gout 06/29/2016  . Type 2 diabetes mellitus ([place]) 12/31/2015  . Chest pain with moderate risk for cardiac etiology 12/29/2015  . Coronary artery dissection   . Pure hypercholesterolemia   . Lung nodule seen on imaging study 09/24/2014  . Paroxysmal atrial fibrillation ([place]) 09/11/2014  . Asthmatic bronchitis with exacerbation 05/25/2014  . Primary hyperparathyroidism ([place]) 04/15/2014  . Palpitations 03/20/2014  . Radicular low back pain 11/24/2013  . Chronic diastolic heart failure ([place]) 10/02/2013  . Shortness of breath 08/31/2013  . Venous stasis dermatitis  of both lower extremities 08/19/2013  . Peripheral edema 06/12/2013  . Other malaise and fatigue 06/12/2013  . Nocturia 06/12/2013  . Cellulitis of leg, right 10/11/2012  . Morbid obesity ([place]) 08/09/2012  . Urinary incontinence, urge 07/11/2012  . CAD (coronary artery disease) 10/31/2010  . NEOPLASM OF UNCERTAIN BEHAVIOR OF SKIN 02/11/2010  . Bipolar disorder ([place]) 09/14/2009  . ACUT MYOCARD INFARCT UNS SITE SUBSQT EPIS CARE 07/02/2008  . EDEMA 06/23/2008  . Hyperlipidemia 05/23/2008  . UNSPECIFIED DISORDER OF THYROID 02/04/2008  . Obesity 02/04/2008  . HYPERTENSION, BENIGN ESSENTIAL 02/04/2008  . MYOCARDIAL INFARCTION, HX OF 03/05/2007    Past Medical History:  Diagnosis Date  . Anemia   . Anxiety   . Asthma   . Atrial fibrillation ([place])   . Bipolar disorder ([place])   . Bronchitis    hx of   . Cancer ([place])    basal cell carcinoma on right hand, papilloma left breast  . CHF (congestive heart failure) ([place])    pt seen at heart/vascular spec clinic 08/14/2014   . Coarse tremors    hands  . Coronary artery disease   . Depression   . DVT (deep vein thrombosis) in pregnancy ([place])    pt. reports that it was post knee surgery, not during pregnancy  . Dyslipidemia   . Dysrhythmia    A fib  . Fall   . Fatty liver   . Headache    migraines -  stopped after menopause  . Heart attack ([place]) 02/2007   non-q-wave with second septal perforator  . Hyperlipidemia   . Hypertension   . Hypothyroidism    history of took medication, has resolved  . Knee pain, left   . Lower extremity deep venous thrombosis (HCC)    20 years ago   . Measles    hx of in childhood   . Morbid obesity with BMI of 40.0-44.9, adult ([place])   . Pain at surgical incision    Left breast from procedure 09/11/16  . Pneumonia    hx of   . PONV (postoperative nausea and vomiting)    also slow to wake up  . Psychiatric hospitalization 05/2008  . Shingles    20 years ago   . Shortness of breath dyspnea     exercise   . Type 2 diabetes mellitus ([place]) 12/31/2015  . Urinary incontinence   . Urinary tract bacterial infections     Past Surgical History:  Procedure Laterality Date  . BASAL CELL CARCINOMA EXCISION    . BREAST LUMPECTOMY WITH RADIOACTIVE SEED LOCALIZATION Left 09/11/2016   Procedure: LEFT BREAST LUMPECTOMY WITH RADIOACTIVE SEED LOCALIZATION;  Surgeon: Excell Seltzer, MD;  Location: [facility];  Service: General;  Laterality: Left;  . BREAST MASS EXCISION     age 31, benign tumor  . CARDIAC CATHETERIZATION    . CARDIAC CATHETERIZATION N/A 12/30/2015   Procedure: Left Heart Cath and Coronary Angiography;  Surgeon: Belva Crome, MD;  Location: [facility];  Service: Cardiovascular;  Laterality: N/A;  . COLONOSCOPY    . DILATATION & CURETTAGE/HYSTEROSCOPY WITH MYOSURE N/A 09/22/2016   Procedure: DILATATION & CURETTAGE/HYSTEROSCOPY WITH MYOSURE;  Surgeon: Molli Posey, MD;  Location: [facility];  Service: Gynecology;  Laterality: N/A;  . DILATION AND CURETTAGE OF UTERUS    . KNEE SURGERY     right, removed cartilage  . PARATHYROIDECTOMY N/A 08/25/2014   Procedure: PARATHYROIDECTOMY;  Surgeon: Armandina Gemma, MD;  [facility];  Service: General;  Laterality: N/A;  . SHOULDER ARTHROSCOPY WITH ROTATOR CUFF REPAIR AND SUBACROMIAL DECOMPRESSION Right 02/27/2017   Procedure: Right shoulder arthroscopic rotator cuff repair with biceps tenodesis and subacromial decompression;  Surgeon: Nicholes Stairs, MD;  Location: [facility];  Service: Orthopedics;  Laterality: Right;  150 mins  . TONSILLECTOMY    . TUBAL LIGATION      Social History   Tobacco Use  . Smoking status: Never Smoker  . Smokeless tobacco: Never Used  Substance Use Topics  . Alcohol use: Yes    Alcohol/week: 0.0 oz    Comment: occ.  . Drug use: No    Family History  Problem Relation Age of Onset  . Breast cancer Mother 12  . Alcohol abuse Mother   . Alzheimer's disease Mother   . Alcohol abuse Father   .  Alzheimer's disease Father   . Parkinson's disease Father   . Alcohol abuse Maternal Grandfather   . Drug abuse Maternal Grandfather   . Alcohol abuse Maternal Grandmother   . Alcohol abuse Paternal Grandfather   . Alcohol abuse Paternal Grandmother   . Schizophrenia Cousin   . Diabetes Brother   . Hypertension Brother     Allergies  Allergen Reactions  . Eggs Or Egg-Derived Products Hives and Other (See Comments)    Can eat foods with cooked eggs; CANNOT handle when part of flu vaccine, etc.   . Lamictal [Lamotrigine] Hives and Rash    Medication list  has been reviewed and updated.  Current Outpatient Medications on File Prior to Visit  Medication Sig Dispense Refill  . albuterol (PROVENTIL HFA;VENTOLIN HFA) 108 (90 BASE) MCG/ACT inhaler Inhale 2 puffs into the lungs every 6 (six) hours as needed for wheezing. 1 Inhaler 1  . allopurinol (ZYLOPRIM) 100 MG tablet TAKE 2 TABLETS BY MOUTH EVERY DAY (Patient taking differently: TAKE 200 MG BY MOUTH EVERY DAY) 60 tablet 6  . ALPRAZolam (XANAX) 0.5 MG tablet Take 0.5 mg by mouth 2 (two) times daily as needed for anxiety.    . benztropine (COGENTIN) 1 MG tablet Take 1 mg by mouth 2 (two) times daily.  2  . divalproex (DEPAKOTE ER) 500 MG 24 hr tablet Take 1,500 mg by mouth at bedtime.     . furosemide (LASIX) 80 MG tablet TAKE 1 TAB DAILY. MAY ALSO TAKE 1/2 TAB AS NEEDED FOR FLUID. PLEASE CALL [phone] FOR FOLLOW UP. 45 tablet 0  . LATUDA 60 MG TABS Take 60 mg by mouth at bedtime.     [name] lisinopril (PRINIVIL,ZESTRIL) 20 MG tablet TAKE 1 TABLET BY MOUTH EVERY DAY 90 tablet 3  . metFORMIN (GLUCOPHAGE) 500 MG tablet TAKE 1 TABLET (500 MG TOTAL) BY MOUTH 2 (TWO) TIMES DAILY WITH A MEAL. 60 tablet 3  . metoprolol tartrate (LOPRESSOR) 25 MG tablet Take 1 tablet (25 mg total) by mouth 2 (two) times daily. (Patient taking differently: Take 50 mg by mouth daily. ) 180 tablet 3  . nitroGLYCERIN (NITROSTAT) 0.4 MG SL tablet Place 1 tablet (0.4 mg  total) under the tongue every 5 (five) minutes as needed for chest pain. For chest pain 25 tablet 3  . oxyCODONE (ROXICODONE) 5 MG immediate release tablet Take 1-2 tablets (5-10 mg total) by mouth every 6 (six) hours as needed for moderate pain or severe pain. 30 tablet 0  . rosuvastatin (CRESTOR) 20 MG tablet TAKE 1 TABLET BY MOUTH EVERY DAY (Patient taking differently: TAKE 20 MG BY MOUTH EVERY DAY) 30 tablet 5  . triamcinolone ointment (KENALOG) 0.1 % APPLY TO AFFECTED AREA TWICE A DAY FOR 14 DAYS (Patient taking differently: Apply 1 application topically 2 (two) times daily as needed (for dry skin). ) 30 g 1  . UNABLE TO FIND Med Name: One Touch Verio blood glucose meter Lot #[id number] x Exp 01/22/2018    . XARELTO 20 MG TABS tablet TAKE 1 TABLET BY MOUTH EVERY DAY WITH SUPPER (Patient taking differently: Take 20 mg by mouth at bedtime) 30 tablet 11   No current facility-administered medications on file prior to visit.     Review of Systems:  As per HPI- otherwise negative.   Physical Examination: Vitals:   06/14/17 1121  BP: 112/68  Pulse: 68  Resp: 18  Temp: 98 F (36.7 C)  SpO2: 98%   Vitals:   06/14/17 1121  Weight: (!) 315 lb (142.9 kg)  Height: 5\' 11"  (1.803 m)   Body mass index is 43.93 kg/m. Ideal Body Weight: Weight in (lb) to have BMI = 25: 178.9  GEN: WDWN, NAD, Non-toxic, A & O x 3 HEENT: Atraumatic, Normocephalic. Neck supple. No masses, No LAD. Ears and Nose: No external deformity. CV: RRR, No M/G/R. No JVD. No thrill. No extra heart sounds. PULM: CTA B, no wheezes, crackles, rhonchi. No retractions. No resp. distress. No accessory muscle use. EXTR: No c/c/e NEURO Normal gait.  PSYCH: Normally interactive. Conversant. Not depressed or anxious appearing.  Calm demeanor.  Quite obese, ow looks well  Left anterior thigh displays 3 discrete skin lesions which appear to be scabbed over and surrounded by minimal erythema  Chronic venous stasis derm on  bilateral ankles but this is a separate issue  Assessment and Plan: Cellulitis of left leg without foot - Plan: dicloxacillin (DYNAPEN) 250 MG capsule possible cellulitis in a pt with multiple other medical issues Will cover with 5 days of dicloxacillin chosen for narrow spectrum and lack of interaction with her other meds     Signed Lamar Blinks, MD

## 2017-06-14 ENCOUNTER — Encounter: Payer: Self-pay | Admitting: Family Medicine

## 2017-06-14 ENCOUNTER — Ambulatory Visit: Payer: BC Managed Care – PPO | Admitting: Family Medicine

## 2017-06-14 VITALS — BP 112/68 | HR 68 | Temp 98.0°F | Resp 18 | Ht 71.0 in | Wt 315.0 lb

## 2017-06-14 DIAGNOSIS — L03116 Cellulitis of left lower limb: Secondary | ICD-10-CM | POA: Diagnosis not present

## 2017-06-14 MED ORDER — DICLOXACILLIN SODIUM 250 MG PO CAPS: 250.0000 mg | ORAL_CAPSULE | Freq: Four times a day (QID) | ORAL | 0 refills | Status: DC

## 2017-06-14 NOTE — Patient Instructions (Signed)
Will have you use dicloxacillin antibiotic 4x a day for 5 days for your leg issue.  Let me know if not resolve in a week- Sooner if worse.

## 2017-07-10 LAB — HEPATIC FUNCTION PANEL
ALK PHOS: 103 (ref 25–125)
ALT: 17 (ref 7–35)
AST: 12 — AB (ref 13–35)

## 2017-07-10 LAB — LIPID PANEL
CHOLESTEROL: 136 (ref 0–200)
HDL: 47 (ref 35–70)
LDL CALC: 67
Triglycerides: 111 (ref 40–160)

## 2017-07-10 LAB — BASIC METABOLIC PANEL
BUN: 20 (ref 4–21)
Creatinine: 0.9 (ref <=1.1)
GLUCOSE: 110
Potassium: 4.4 (ref 3.4–5.3)
Sodium: 145 (ref 137–147)

## 2017-07-10 LAB — TSH: TSH: 5.28 (ref 0.41–5.90)

## 2017-07-10 LAB — CBC AND DIFFERENTIAL
HEMATOCRIT: 44 (ref 36–46)
HEMOGLOBIN: 14.5 (ref 12.0–16.0)
PLATELETS: 144 — AB (ref 150–399)
WBC: 4.5

## 2017-07-10 LAB — VITAMIN D 25 HYDROXY (VIT D DEFICIENCY, FRACTURES): VIT D 25 HYDROXY: 36

## 2017-07-10 LAB — VITAMIN B12: Vitamin B-12: 713

## 2017-07-10 LAB — HEMOGLOBIN A1C: Hemoglobin A1C: 5.9

## 2017-07-19 ENCOUNTER — Other Ambulatory Visit: Payer: Self-pay | Admitting: Family Medicine

## 2017-07-23 ENCOUNTER — Other Ambulatory Visit (HOSPITAL_COMMUNITY): Payer: Self-pay | Admitting: Internal Medicine

## 2017-07-23 ENCOUNTER — Telehealth: Payer: Self-pay | Admitting: Family Medicine

## 2017-07-23 ENCOUNTER — Other Ambulatory Visit (HOSPITAL_COMMUNITY): Payer: Self-pay | Admitting: *Deleted

## 2017-07-23 MED ORDER — METOPROLOL TARTRATE 25 MG PO TABS: 25.0000 mg | ORAL_TABLET | Freq: Two times a day (BID) | ORAL | 3 refills | Status: DC

## 2017-07-23 NOTE — Telephone Encounter (Signed)
Copied from [place] [id number]. Topic: Quick Communication - See Telephone Encounter >> Jul 23, 2017 10:34 AM [name] wrote: CRM for notification. See Telephone encounter for: 07/23/17.   Pt dropped off copies of her last lab results so provider can see them before her appt on Monday July 30, 2017 at 3:00. Pt was a bit worry about her results. Document put at front office tray under providers name.

## 2017-07-24 ENCOUNTER — Telehealth (HOSPITAL_COMMUNITY): Payer: Self-pay | Admitting: *Deleted

## 2017-07-24 ENCOUNTER — Other Ambulatory Visit: Payer: Self-pay | Admitting: Family Medicine

## 2017-07-24 NOTE — Telephone Encounter (Signed)
Called in stating she was having palpitations and really wanted to be seen in clinic. Pt added to app clinic tomorrow.

## 2017-07-24 NOTE — Telephone Encounter (Signed)
Received Lab Report results from [facility]; forwarded to provider/SLS 07/02

## 2017-07-25 ENCOUNTER — Ambulatory Visit (HOSPITAL_COMMUNITY)
Admission: RE | Admit: 2017-07-25 | Discharge: 2017-07-25 | Disposition: A | Discharge status: Home / Self Care | Payer: BC Managed Care – PPO | Source: Ambulatory Visit | Attending: Internal Medicine | Admitting: Internal Medicine

## 2017-07-25 ENCOUNTER — Encounter (HOSPITAL_COMMUNITY): Payer: Self-pay

## 2017-07-25 VITALS — BP 156/98 | HR 90 | Wt 320.8 lb

## 2017-07-25 DIAGNOSIS — R002 Palpitations: Secondary | ICD-10-CM | POA: Diagnosis not present

## 2017-07-25 DIAGNOSIS — I110 Hypertensive heart disease with heart failure: Secondary | ICD-10-CM | POA: Diagnosis present

## 2017-07-25 DIAGNOSIS — I2510 Atherosclerotic heart disease of native coronary artery without angina pectoris: Secondary | ICD-10-CM | POA: Insufficient documentation

## 2017-07-25 DIAGNOSIS — Z85828 Personal history of other malignant neoplasm of skin: Secondary | ICD-10-CM | POA: Diagnosis not present

## 2017-07-25 DIAGNOSIS — I5032 Chronic diastolic (congestive) heart failure: Secondary | ICD-10-CM | POA: Insufficient documentation

## 2017-07-25 DIAGNOSIS — R0683 Snoring: Secondary | ICD-10-CM | POA: Diagnosis not present

## 2017-07-25 DIAGNOSIS — I480 Paroxysmal atrial fibrillation: Secondary | ICD-10-CM | POA: Insufficient documentation

## 2017-07-25 DIAGNOSIS — Z9889 Other specified postprocedural states: Secondary | ICD-10-CM | POA: Insufficient documentation

## 2017-07-25 DIAGNOSIS — E119 Type 2 diabetes mellitus without complications: Secondary | ICD-10-CM | POA: Insufficient documentation

## 2017-07-25 DIAGNOSIS — F419 Anxiety disorder, unspecified: Secondary | ICD-10-CM | POA: Insufficient documentation

## 2017-07-25 DIAGNOSIS — Z7902 Long term (current) use of antithrombotics/antiplatelets: Secondary | ICD-10-CM | POA: Diagnosis not present

## 2017-07-25 DIAGNOSIS — Z6841 Body Mass Index (BMI) 40.0 and over, adult: Secondary | ICD-10-CM | POA: Diagnosis not present

## 2017-07-25 DIAGNOSIS — Z9851 Tubal ligation status: Secondary | ICD-10-CM | POA: Insufficient documentation

## 2017-07-25 DIAGNOSIS — Z7984 Long term (current) use of oral hypoglycemic drugs: Secondary | ICD-10-CM | POA: Insufficient documentation

## 2017-07-25 DIAGNOSIS — Z79899 Other long term (current) drug therapy: Secondary | ICD-10-CM | POA: Diagnosis not present

## 2017-07-25 DIAGNOSIS — J45909 Unspecified asthma, uncomplicated: Secondary | ICD-10-CM | POA: Diagnosis not present

## 2017-07-25 DIAGNOSIS — E785 Hyperlipidemia, unspecified: Secondary | ICD-10-CM | POA: Insufficient documentation

## 2017-07-25 DIAGNOSIS — E213 Hyperparathyroidism, unspecified: Secondary | ICD-10-CM | POA: Diagnosis not present

## 2017-07-25 DIAGNOSIS — Z9861 Coronary angioplasty status: Secondary | ICD-10-CM | POA: Insufficient documentation

## 2017-07-25 DIAGNOSIS — Z7901 Long term (current) use of anticoagulants: Secondary | ICD-10-CM | POA: Diagnosis not present

## 2017-07-25 DIAGNOSIS — F319 Bipolar disorder, unspecified: Secondary | ICD-10-CM | POA: Insufficient documentation

## 2017-07-25 DIAGNOSIS — I10 Essential (primary) hypertension: Secondary | ICD-10-CM

## 2017-07-25 NOTE — Patient Instructions (Signed)
Will schedule you for an echocardiogram and holter monitor at [facility]. Address: [address] ([place]), [place], [place] [address]  Phone: [phone] Their office will call to schedule.  Follow up as scheduled.  Take all medication as prescribed the day of your appointment. Bring all medications with you to your appointment.  Do the following things EVERYDAY: 1) Weigh yourself in the morning before breakfast. Write it down and keep it in a log. 2) Take your medicines as prescribed 3) Eat low salt foods-Limit salt (sodium) to 2000 mg per day.  4) Stay as active as you can everyday 5) Limit all fluids for the day to less than 2 liters

## 2017-07-25 NOTE — Progress Notes (Signed)
Patient ID: Kathleen [name], female   DOB: 08/13/1955, 62 y.o.   MRN: [id number]    Advanced Heart Failure Clinic Note   Primary HF MD: Dr [name] PCP: Dr Rodney Booze   HPI Ms. 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 Sleep study 11/2013 was negative. Also has history of A fib.    Had NSTEMI in 2/09 had cath at that time with occlusion of septal perforator. Otherwise normal arteries. Was admitted for CP in 11/13. CE normal. Underwent dobutamine stress echo which was normal.   Admitted 09/11/14 for Afib RVR. She converted to sinus tach with dilt drip and was transitioned to Toprol-XL 25 mg daily. She was in NSR at the time of discharge. Discharge weight was 325 lb.  Admitted 7/12-7/14/17 with chest pain. CTA negative for PE. Troponins negative.  Had Myoview 08/05/15 with "subtle" anterior wall defect. Reviewed by Dr. [name] at visit 08/25/15, thought to possibly be breast attenuation. Echo stable as below.   Admitted 12/6-12/82017 with CP and dyspnea. Cardiac work up was negative. Discharge weight was 334 pounds.   Today she is being seen for palpitations. Over the last months she has had increased palpitations. Mild dyspnea with exertion. Denies PND/Orthopnea. No bleeding problems. Appetite ok. No fever or chills. Weight at home has been stable. Taking all medications but did not take diuretics this morning.   Echo 11/13 EF 60-65% Mild LAE. RV normal. No PH. No comment on diastolic function.  Echo 09/04/13 EF 55-60%  Grade I DD Echo 08/05/15 EF 55-60%  Grade I DD  Labs:  09/25/13 K 4.1 Creatinine 1.0 , LDL 79 10/21/2014: K 5.4 Creatinine 0.95  02/22/2015: K 4.4 Creatinine 1.08  03/30/2016: K 4.3 Creatinine 1.09  07/10/2017: K 4.4 Creatinine 0.9 TSH 5.28  Allergies  Allergen Reactions  . Eggs Or Egg-Derived Products Hives and Other (See Comments)    Can eat foods with cooked eggs; CANNOT handle when part of flu  vaccine, etc.   . Lamictal [Lamotrigine] Hives and Rash    Current Outpatient Medications  Medication Sig Dispense Refill  . albuterol (PROVENTIL HFA;VENTOLIN HFA) 108 (90 BASE) MCG/ACT inhaler Inhale 2 puffs into the lungs every 6 (six) hours as needed for wheezing. 1 Inhaler 1  . allopurinol (ZYLOPRIM) 100 MG tablet TAKE 200 MG BY MOUTH EVERY DAY 60 tablet 6  . ALPRAZolam (XANAX) 0.5 MG tablet Take 0.5 mg by mouth 2 (two) times daily as needed for anxiety.    . benztropine (COGENTIN) 1 MG tablet Take 1 mg by mouth 2 (two) times daily.  2  . divalproex (DEPAKOTE ER) 500 MG 24 hr tablet Take 1,500 mg by mouth at bedtime.     . furosemide (LASIX) 80 MG tablet TAKE 1 TAB DAILY. MAY ALSO TAKE 1/2 TAB AS NEEDED FOR FLUID. PLEASE CALL [phone] FOR FOLLOW UP. 45 tablet 0  . LATUDA 60 MG TABS Take 60 mg by mouth at bedtime.     [name] lisinopril (PRINIVIL,ZESTRIL) 20 MG tablet TAKE 1 TABLET BY MOUTH EVERY DAY 90 tablet 3  . metFORMIN (GLUCOPHAGE) 500 MG tablet TAKE 1 TABLET (500 MG TOTAL) BY MOUTH 2 (TWO) TIMES DAILY WITH A MEAL. 60 tablet 3  . metoprolol tartrate (LOPRESSOR) 25 MG tablet Take 1 tablet (25 mg total) by mouth 2 (two) times daily. 180 tablet 3  . nitroGLYCERIN (NITROSTAT) 0.4 MG SL tablet Place 1 tablet (  0.4 mg total) under the tongue every 5 (five) minutes as needed for chest pain. For chest pain 25 tablet 3  . rosuvastatin (CRESTOR) 20 MG tablet TAKE 1 TABLET BY MOUTH EVERY DAY (Patient taking differently: TAKE 20 MG BY MOUTH EVERY DAY) 30 tablet 5  . triamcinolone ointment (KENALOG) 0.1 % APPLY TO AFFECTED AREA TWICE A DAY FOR 14 DAYS (Patient taking differently: Apply 1 application topically 2 (two) times daily as needed (for dry skin). ) 30 g 1  . UNABLE TO FIND Med Name: One Touch Verio blood glucose meter Lot #[id number] x Exp 01/22/2018    . XARELTO 20 MG TABS tablet TAKE 1 TABLET BY MOUTH EVERY DAY WITH SUPPER (Patient taking differently: Take 20 mg by mouth at bedtime) 30 tablet 11    . dicloxacillin (DYNAPEN) 250 MG capsule Take 1 capsule (250 mg total) by mouth 4 (four) times daily. Take for 5 days (Patient not taking: Reported on 07/25/2017) 20 capsule 0  . oxyCODONE (ROXICODONE) 5 MG immediate release tablet Take 1-2 tablets (5-10 mg total) by mouth every 6 (six) hours as needed for moderate pain or severe pain. (Patient not taking: Reported on 07/25/2017) 30 tablet 0   No current facility-administered medications for this encounter.     Past Medical History:  Diagnosis Date  . Anemia   . Anxiety   . Asthma   . Atrial fibrillation ([place])   . Bipolar disorder ([place])   . Bronchitis    hx of   . Cancer ([place])    basal cell carcinoma on right hand, papilloma left breast  . CHF (congestive heart failure) ([place])    pt seen at heart/vascular spec clinic 08/14/2014   . Coarse tremors    hands  . Coronary artery disease   . Depression   . DVT (deep vein thrombosis) in pregnancy ([place])    pt. reports that it was post knee surgery, not during pregnancy  . Dyslipidemia   . Dysrhythmia    A fib  . Fall   . Fatty liver   . Headache    migraines - stopped after menopause  . Heart attack ([place]) 02/2007   non-q-wave with second septal perforator  . Hyperlipidemia   . Hypertension   . Hypothyroidism    history of took medication, has resolved  . Knee pain, left   . Lower extremity deep venous thrombosis (HCC)    20 years ago   . Measles    hx of in childhood   . Morbid obesity with BMI of 40.0-44.9, adult ([place])   . Pain at surgical incision    Left breast from procedure 09/11/16  . Pneumonia    hx of   . PONV (postoperative nausea and vomiting)    also slow to wake up  . Psychiatric hospitalization 05/2008  . Shingles    20 years ago   . Shortness of breath dyspnea    exercise   . Type 2 diabetes mellitus ([place]) 12/31/2015  . Urinary incontinence   . Urinary tract bacterial infections     Past Surgical History:  Procedure Laterality Date  . BASAL CELL CARCINOMA  EXCISION    . BREAST LUMPECTOMY WITH RADIOACTIVE SEED LOCALIZATION Left 09/11/2016   Procedure: LEFT BREAST LUMPECTOMY WITH RADIOACTIVE SEED LOCALIZATION;  Surgeon: Excell Seltzer, MD;  Location: [facility];  Service: General;  Laterality: Left;  . BREAST MASS EXCISION     age 56, benign tumor  . CARDIAC CATHETERIZATION    . CARDIAC  CATHETERIZATION N/A 12/30/2015   Procedure: Left Heart Cath and Coronary Angiography;  Surgeon: Belva Crome, MD;  Location: [facility];  Service: Cardiovascular;  Laterality: N/A;  . COLONOSCOPY    . DILATATION & CURETTAGE/HYSTEROSCOPY WITH MYOSURE N/A 09/22/2016   Procedure: DILATATION & CURETTAGE/HYSTEROSCOPY WITH MYOSURE;  Surgeon: Molli Posey, MD;  Location: [facility];  Service: Gynecology;  Laterality: N/A;  . DILATION AND CURETTAGE OF UTERUS    . KNEE SURGERY     right, removed cartilage  . PARATHYROIDECTOMY N/A 08/25/2014   Procedure: PARATHYROIDECTOMY;  Surgeon: Armandina Gemma, MD;  Location: [facility];  Service: General;  Laterality: N/A;  . SHOULDER ARTHROSCOPY WITH ROTATOR CUFF REPAIR AND SUBACROMIAL DECOMPRESSION Right 02/27/2017   Procedure: Right shoulder arthroscopic rotator cuff repair with biceps tenodesis and subacromial decompression;  Surgeon: Nicholes Stairs, MD;  Location: [facility];  Service: Orthopedics;  Laterality: Right;  150 mins  . TONSILLECTOMY    . TUBAL LIGATION      ROS:  As stated in the HPI and negative for all other systems.  PHYSICAL EXAM BP (!) 156/98   Pulse 90   Wt (!) 320 lb 12.8 oz (145.5 kg)   SpO2 98%   BMI 44.74 kg/m    Wt Readings from Last 3 Encounters:  07/25/17 (!) 320 lb 12.8 oz (145.5 kg)  06/14/17 (!) 315 lb (142.9 kg)  05/31/17 (!) 313 lb (142 kg)    General:  Well appearing. No resp difficulty. Walked in the clinic.  HEENT: normal Neck: supple. no JVD. Carotids 2+ bilat; no bruits. No lymphadenopathy or thryomegaly appreciated. Cor: PMI nondisplaced. Regular rate & rhythm. No rubs, gallops or  murmurs. Lungs: clear Abdomen: obese,soft, nontender, nondistended. No hepatosplenomegaly. No bruits or masses. Good bowel sounds. Extremities: no cyanosis, clubbing, rash, edema. R and LLE hyperpigmentation.  Neuro: alert & orientedx3, cranial nerves grossly intact. moves all 4 extremities w/o difficulty. Affect pleasant  EKG: NSR 87 bpm   ASSESSMENT AND PLAN 1. Chronic diastolic HF:  ECHO 07/2534 EF Normal EF 55-60% Grade IDD  Volume status stable. Continue current diuretic regimen.  Repeat ECHO next visit.  2. PAF  CHADSVASC = 3. In NSR. Continue bb and xarelto.  3. Morbid obesity: Body mass index is 44.74 kg/m. Discussed portion control. 4. CAD:  - Continue ASA 81, Plavix, statin.   5. HTN:   Elevated. Should improve with diuretics.   6. Snoring:  - No OSA on sleep study.  7. Hyperparathyroidism-   - s/p partial parathyroidectomy 08/2014. Per PCP.  8. Palpitations Ordered 48 hour holter monitor.   Follow up in August with Dr [name].      [name] Clegg NP-C  07/25/2017

## 2017-07-26 ENCOUNTER — Other Ambulatory Visit: Payer: Self-pay | Admitting: Family Medicine

## 2017-07-27 ENCOUNTER — Other Ambulatory Visit: Payer: Self-pay | Admitting: Family Medicine

## 2017-07-27 NOTE — Progress Notes (Addendum)
[place] at [facility] [address], [place], [place] [address] [phone]  Date:  07/30/2017   Name:  Kathleen [name]   DOB:  1955/03/05   MRN:  [id number]  PCP:  Darreld Mclean, MD    Chief Complaint: Thyroid Level (follow up, recheck)   History of Present Illness:  Kathleen [name] is a 62 y.o. very pleasant female patient who presents with the following:  Following up today History of gout, DM, hyperlipidemia, CAD/ CHF--she is an advanced CHF clinic pt, recent visit with them from 7/3 [place] 1. Chronic diastolic HF:  ECHO 06/2829 EF Normal EF 55-60% Grade IDD  Volume status stable. Continue current diuretic regimen.  Repeat ECHO next visit.  2. PAF  CHADSVASC = 3. In NSR. Continue bb and xarelto.  3. Morbid obesity: Body mass index is 44.74 kg/m. Discussed portion control. 4. CAD:  - Continue ASA 81, Plavix, statin.   5. HTN:   Elevated. Should improve with diuretics.   6. Snoring:  - No OSA on sleep study.  7. Hyperparathyroidism-   - s/p partial parathyroidectomy 08/2014. Per PCP.  8. Palpitations Ordered 48 hour holter monitor  Recent A1c looked great Today she notes that over the weekend she had vertigo She has had this in the past.  Today she is better as long as she does not turn her head rapidly.   Lab Results  Component Value Date   HGBA1C 6.0 05/31/2017   A recent TSH was noted to be high per her mental health care provider- it was noted to be high at 5.2 Her thyroid issues seemed to start in 2016-  She was on levothyroxine for a while but last took it about 2.5 years ago per her recollection. In the interim her TSH has been ok She is othewrise feeling well today Her shoulder is still painful but getting a bit better gradually  Patient Active Problem List   Diagnosis Date Noted  . Complete rotator cuff tear or rupture of right shoulder, not specified as traumatic 02/27/2017  . Auditory  hallucination 12/13/2016  . Tremor of both hands 11/20/2016  . Gout 06/29/2016  . Type 2 diabetes mellitus ([place]) 12/31/2015  . Chest pain with moderate risk for cardiac etiology 12/29/2015  . Coronary artery dissection   . Pure hypercholesterolemia   . Lung nodule seen on imaging study 09/24/2014  . Paroxysmal atrial fibrillation ([place]) 09/11/2014  . Asthmatic bronchitis with exacerbation 05/25/2014  . Primary hyperparathyroidism ([place]) 04/15/2014  . Palpitations 03/20/2014  . Radicular low back pain 11/24/2013  . Chronic diastolic heart failure ([place]) 10/02/2013  . Shortness of breath 08/31/2013  . Venous stasis dermatitis of both lower extremities 08/19/2013  . Peripheral edema 06/12/2013  . Other malaise and fatigue 06/12/2013  . Nocturia 06/12/2013  . Cellulitis of leg, right 10/11/2012  . Morbid obesity ([place]) 08/09/2012  . Urinary incontinence, urge 07/11/2012  . CAD (coronary artery disease) 10/31/2010  . NEOPLASM OF UNCERTAIN BEHAVIOR OF SKIN 02/11/2010  . Bipolar disorder ([place]) 09/14/2009  . ACUT MYOCARD INFARCT UNS SITE SUBSQT EPIS CARE 07/02/2008  . EDEMA 06/23/2008  . Hyperlipidemia 05/23/2008  . UNSPECIFIED DISORDER OF THYROID 02/04/2008  . Obesity 02/04/2008  . HYPERTENSION, BENIGN ESSENTIAL 02/04/2008  . MYOCARDIAL INFARCTION, HX OF 03/05/2007    Past Medical History:  Diagnosis Date  . Anemia   . Anxiety   . Asthma   . Atrial fibrillation (  [place])   . Bipolar disorder ([place])   . Bronchitis    hx of   . Cancer ([place])    basal cell carcinoma on right hand, papilloma left breast  . CHF (congestive heart failure) ([place])    pt seen at heart/vascular spec clinic 08/14/2014   . Coarse tremors    hands  . Coronary artery disease   . Depression   . DVT (deep vein thrombosis) in pregnancy ([name])    pt. reports that it was post knee surgery, not during pregnancy  . Dyslipidemia   . Dysrhythmia    A fib  . Fall   . Fatty liver   . Headache    migraines - stopped  after menopause  . Heart attack ([place]) 02/2007   non-q-wave with second septal perforator  . Hyperlipidemia   . Hypertension   . Hypothyroidism    history of took medication, has resolved  . Knee pain, left   . Lower extremity deep venous thrombosis (HCC)    20 years ago   . Measles    hx of in childhood   . Morbid obesity with BMI of 40.0-44.9, adult ([place])   . Pain at surgical incision    Left breast from procedure 09/11/16  . Pneumonia    hx of   . PONV (postoperative nausea and vomiting)    also slow to wake up  . Psychiatric hospitalization 05/2008  . Shingles    20 years ago   . Shortness of breath dyspnea    exercise   . Type 2 diabetes mellitus ([place]) 12/31/2015  . Urinary incontinence   . Urinary tract bacterial infections     Past Surgical History:  Procedure Laterality Date  . BASAL CELL CARCINOMA EXCISION    . BREAST LUMPECTOMY WITH RADIOACTIVE SEED LOCALIZATION Left 09/11/2016   Procedure: LEFT BREAST LUMPECTOMY WITH RADIOACTIVE SEED LOCALIZATION;  Surgeon: Excell Seltzer, MD;  Location: [facility];  Service: General;  Laterality: Left;  . BREAST MASS EXCISION     age 33, benign tumor  . CARDIAC CATHETERIZATION    . CARDIAC CATHETERIZATION N/A 12/30/2015   Procedure: Left Heart Cath and Coronary Angiography;  Surgeon: Belva Crome, MD;  Location: [facility];  Service: Cardiovascular;  Laterality: N/A;  . COLONOSCOPY    . DILATATION & CURETTAGE/HYSTEROSCOPY WITH MYOSURE N/A 09/22/2016   Procedure: DILATATION & CURETTAGE/HYSTEROSCOPY WITH MYOSURE;  Surgeon: Molli Posey, MD;  Location: [facility];  Service: Gynecology;  Laterality: N/A;  . DILATION AND CURETTAGE OF UTERUS    . KNEE SURGERY     right, removed cartilage  . PARATHYROIDECTOMY N/A 08/25/2014   Procedure: PARATHYROIDECTOMY;  Surgeon: Armandina Gemma, MD;  [facility];  Service: General;  Laterality: N/A;  . SHOULDER ARTHROSCOPY WITH ROTATOR CUFF REPAIR AND SUBACROMIAL DECOMPRESSION Right 02/27/2017    Procedure: Right shoulder arthroscopic rotator cuff repair with biceps tenodesis and subacromial decompression;  Surgeon: Nicholes Stairs, MD;  Location: [facility];  Service: Orthopedics;  Laterality: Right;  150 mins  . TONSILLECTOMY    . TUBAL LIGATION      Social History   Tobacco Use  . Smoking status: Never Smoker  . Smokeless tobacco: Never Used  Substance Use Topics  . Alcohol use: Yes    Alcohol/week: 0.0 oz    Comment: occ.  . Drug use: No    Family History  Problem Relation Age of Onset  . Breast cancer Mother 60  . Alcohol abuse Mother   .  Alzheimer's disease Mother   . Alcohol abuse Father   . Alzheimer's disease Father   . Parkinson's disease Father   . Alcohol abuse Maternal Grandfather   . Drug abuse Maternal Grandfather   . Alcohol abuse Maternal Grandmother   . Alcohol abuse Paternal Grandfather   . Alcohol abuse Paternal Grandmother   . Schizophrenia Cousin   . Diabetes Brother   . Hypertension Brother     Allergies  Allergen Reactions  . Eggs Or Egg-Derived Products Hives and Other (See Comments)    Can eat foods with cooked eggs; CANNOT handle when part of flu vaccine, etc.   . Lamictal [Lamotrigine] Hives and Rash    Medication list has been reviewed and updated.  Current Outpatient Medications on File Prior to Visit  Medication Sig Dispense Refill  . albuterol (PROVENTIL HFA;VENTOLIN HFA) 108 (90 BASE) MCG/ACT inhaler Inhale 2 puffs into the lungs every 6 (six) hours as needed for wheezing. 1 Inhaler 1  . allopurinol (ZYLOPRIM) 100 MG tablet TAKE 200 MG BY MOUTH EVERY DAY 60 tablet 6  . ALPRAZolam (XANAX) 0.5 MG tablet Take 0.5 mg by mouth 2 (two) times daily as needed for anxiety.    . benztropine (COGENTIN) 1 MG tablet Take 1 mg by mouth 2 (two) times daily.  2  . divalproex (DEPAKOTE ER) 500 MG 24 hr tablet Take 1,500 mg by mouth at bedtime.     . furosemide (LASIX) 80 MG tablet TAKE 1 TAB DAILY. MAY ALSO TAKE 1/2 TAB AS NEEDED FOR FLUID.  PLEASE CALL [phone] FOR FOLLOW UP. 45 tablet 0  . LATUDA 60 MG TABS Take 60 mg by mouth at bedtime.     [name] lisinopril (PRINIVIL,ZESTRIL) 20 MG tablet TAKE 1 TABLET BY MOUTH EVERY DAY 90 tablet 3  . metFORMIN (GLUCOPHAGE) 500 MG tablet TAKE 1 TABLET (500 MG TOTAL) BY MOUTH 2 (TWO) TIMES DAILY WITH A MEAL. 60 tablet 3  . metoprolol tartrate (LOPRESSOR) 25 MG tablet Take 1 tablet (25 mg total) by mouth 2 (two) times daily. 180 tablet 3  . nitroGLYCERIN (NITROSTAT) 0.4 MG SL tablet Place 1 tablet (0.4 mg total) under the tongue every 5 (five) minutes as needed for chest pain. For chest pain 25 tablet 3  . rosuvastatin (CRESTOR) 20 MG tablet TAKE 1 TABLET BY MOUTH EVERY DAY (Patient taking differently: TAKE 20 MG BY MOUTH EVERY DAY) 30 tablet 5  . triamcinolone ointment (KENALOG) 0.1 % APPLY TO AFFECTED AREA TWICE A DAY FOR 14 DAYS (Patient taking differently: Apply 1 application topically 2 (two) times daily as needed (for dry skin). ) 30 g 1  . UNABLE TO FIND Med Name: One Touch Verio blood glucose meter Lot #[id number] x Exp 01/22/2018    . XARELTO 20 MG TABS tablet TAKE 1 TABLET BY MOUTH EVERY DAY WITH SUPPER (Patient taking differently: Take 20 mg by mouth at bedtime) 30 tablet 11   No current facility-administered medications on file prior to visit.     Review of Systems:  As per HPI- otherwise negative. No fever or chills No CP or SOB   Physical Examination: Vitals:   07/30/17 1520  BP: 128/84  Pulse: 79  Resp: 17  SpO2: 96%   Vitals:   07/30/17 1520  Weight: (!) 320 lb 9.6 oz (145.4 kg)  Height: 5\' 11"  (1.803 m)   Body mass index is 44.71 kg/m. Ideal Body Weight: Weight in (lb) to have BMI = 25: 178.9  GEN: WDWN, NAD, Non-toxic, A &  O x 3 HEENT: Atraumatic, Normocephalic. Neck supple. No masses, No LAD. Ears and Nose: No external deformity. CV: RRR, No M/G/R. No JVD. No thrill. No extra heart sounds. PULM: CTA B, no wheezes, crackles, rhonchi. No retractions. No resp.  distress. No accessory muscle use. EXTR: No c/c/e NEURO Normal gait.  PSYCH: Normally interactive. Conversant. Not depressed or anxious appearing.  Calm demeanor.    Assessment and Plan: Elevated TSH - Plan: TSH offered to start on thyroid replacement now vs repeat TSH and treat if still off.  She prefers to do this  Check TSH for her today- if high still will plan to start her on thyroid replacement   Signed Lamar Blinks, MD 7/9- Received her tsh, normal   Results for orders placed or performed in visit on 07/30/17  TSH  Result Value Ref Range   TSH 2.30 0.35 - 4.50 uIU/mL   Message to pt

## 2017-07-30 ENCOUNTER — Encounter: Payer: Self-pay | Admitting: Family Medicine

## 2017-07-30 ENCOUNTER — Other Ambulatory Visit: Payer: Self-pay | Admitting: Family Medicine

## 2017-07-30 ENCOUNTER — Ambulatory Visit: Payer: BC Managed Care – PPO | Admitting: Family Medicine

## 2017-07-30 VITALS — BP 128/84 | HR 79 | Resp 17 | Ht 71.0 in | Wt 320.6 lb

## 2017-07-30 DIAGNOSIS — R7989 Other specified abnormal findings of blood chemistry: Secondary | ICD-10-CM | POA: Diagnosis not present

## 2017-07-31 ENCOUNTER — Encounter: Payer: Self-pay | Admitting: Family Medicine

## 2017-07-31 LAB — TSH: TSH: 2.3 u[IU]/mL (ref 0.35–4.50)

## 2017-08-01 ENCOUNTER — Encounter: Payer: Self-pay | Admitting: Family Medicine

## 2017-08-02 ENCOUNTER — Telehealth: Payer: Self-pay | Admitting: Family Medicine

## 2017-08-02 LAB — CO2, TOTAL: Carbon Dioxide, Total: 20

## 2017-08-02 LAB — CHLORIDE: Chloride: 104

## 2017-08-02 LAB — FOLATE: Folate: 11.9

## 2017-08-02 LAB — T4: T4 TOTAL: 7.2

## 2017-08-02 LAB — ESTIMATED GFR: GFR CALC NON AF AMER: 69

## 2017-08-02 LAB — CALCIUM: CALCIUM: 9.5

## 2017-08-02 MED ORDER — ROSUVASTATIN CALCIUM 20 MG PO TABS: ORAL_TABLET | ORAL | 5 refills | Status: DC

## 2017-08-02 NOTE — Telephone Encounter (Signed)
Copied from [facility] [id number]. Topic: Quick Communication - Rx Refill/Question >> Aug 02, 2017  3:47 PM Percell Belt A wrote: Medication: rosuvastatin (CRESTOR) 20 MG tablet [[id number]]   Has the patient contacted their pharmacy? No  (Agent: If no, request that the patient contact the pharmacy for the refill.) (Agent: If yes, when and what did the pharmacy advise?)  Preferred Pharmacy (with phone number or street name): [facility] on [address]   Agent: Please be advised that RX refills may take up to 3 business days. We ask that you follow-up with your pharmacy.

## 2017-08-02 NOTE — Telephone Encounter (Signed)
Refill req. Crestor/ LRF 02/05/17; #30;RF x 5  Last office visit 05/31/17  PCP Dr. Lorelei Pont  [facility]. [facility] on [address].   Refilled per protocol.

## 2017-08-07 ENCOUNTER — Ambulatory Visit (INDEPENDENT_AMBULATORY_CARE_PROVIDER_SITE_OTHER): Payer: BC Managed Care – PPO

## 2017-08-07 ENCOUNTER — Ambulatory Visit (HOSPITAL_COMMUNITY): Payer: BC Managed Care – PPO | Attending: Cardiovascular Disease

## 2017-08-07 ENCOUNTER — Other Ambulatory Visit: Payer: Self-pay

## 2017-08-07 DIAGNOSIS — Z6841 Body Mass Index (BMI) 40.0 and over, adult: Secondary | ICD-10-CM | POA: Diagnosis not present

## 2017-08-07 DIAGNOSIS — E119 Type 2 diabetes mellitus without complications: Secondary | ICD-10-CM | POA: Insufficient documentation

## 2017-08-07 DIAGNOSIS — R0600 Dyspnea, unspecified: Secondary | ICD-10-CM | POA: Insufficient documentation

## 2017-08-07 DIAGNOSIS — I252 Old myocardial infarction: Secondary | ICD-10-CM | POA: Insufficient documentation

## 2017-08-07 DIAGNOSIS — I110 Hypertensive heart disease with heart failure: Secondary | ICD-10-CM | POA: Diagnosis not present

## 2017-08-07 DIAGNOSIS — E785 Hyperlipidemia, unspecified: Secondary | ICD-10-CM | POA: Insufficient documentation

## 2017-08-07 DIAGNOSIS — I5032 Chronic diastolic (congestive) heart failure: Secondary | ICD-10-CM | POA: Insufficient documentation

## 2017-08-07 DIAGNOSIS — R002 Palpitations: Secondary | ICD-10-CM | POA: Diagnosis not present

## 2017-08-07 DIAGNOSIS — I2542 Coronary artery dissection: Secondary | ICD-10-CM | POA: Diagnosis not present

## 2017-08-07 DIAGNOSIS — I2510 Atherosclerotic heart disease of native coronary artery without angina pectoris: Secondary | ICD-10-CM | POA: Insufficient documentation

## 2017-08-07 DIAGNOSIS — I4891 Unspecified atrial fibrillation: Secondary | ICD-10-CM | POA: Insufficient documentation

## 2017-08-22 ENCOUNTER — Telehealth (HOSPITAL_COMMUNITY): Payer: Self-pay

## 2017-08-22 NOTE — Telephone Encounter (Signed)
Result Notes for ECHOCARDIOGRAM COMPLETE   Notes recorded by Effie Berkshire, RN on 08/22/2017 at 1:25 PM EDT Aware and appreciative ------  Notes recorded by Shirley Friar, PA-C on 08/07/2017 at 1:48 PM EDT Please let her know her EF remains normal!  Thanks

## 2017-09-13 ENCOUNTER — Ambulatory Visit (HOSPITAL_COMMUNITY)
Admission: RE | Admit: 2017-09-13 | Discharge: 2017-09-13 | Disposition: A | Discharge status: Home / Self Care | Payer: BC Managed Care – PPO | Source: Ambulatory Visit | Attending: Internal Medicine | Admitting: Internal Medicine

## 2017-09-13 VITALS — BP 150/96 | HR 70 | Wt 321.8 lb

## 2017-09-13 DIAGNOSIS — Z6841 Body Mass Index (BMI) 40.0 and over, adult: Secondary | ICD-10-CM | POA: Insufficient documentation

## 2017-09-13 DIAGNOSIS — E118 Type 2 diabetes mellitus with unspecified complications: Secondary | ICD-10-CM | POA: Diagnosis not present

## 2017-09-13 DIAGNOSIS — Z85828 Personal history of other malignant neoplasm of skin: Secondary | ICD-10-CM | POA: Insufficient documentation

## 2017-09-13 DIAGNOSIS — E785 Hyperlipidemia, unspecified: Secondary | ICD-10-CM | POA: Insufficient documentation

## 2017-09-13 DIAGNOSIS — Z7984 Long term (current) use of oral hypoglycemic drugs: Secondary | ICD-10-CM | POA: Diagnosis not present

## 2017-09-13 DIAGNOSIS — Z79899 Other long term (current) drug therapy: Secondary | ICD-10-CM | POA: Insufficient documentation

## 2017-09-13 DIAGNOSIS — I480 Paroxysmal atrial fibrillation: Secondary | ICD-10-CM | POA: Diagnosis not present

## 2017-09-13 DIAGNOSIS — I252 Old myocardial infarction: Secondary | ICD-10-CM | POA: Diagnosis not present

## 2017-09-13 DIAGNOSIS — E119 Type 2 diabetes mellitus without complications: Secondary | ICD-10-CM | POA: Insufficient documentation

## 2017-09-13 DIAGNOSIS — I10 Essential (primary) hypertension: Secondary | ICD-10-CM

## 2017-09-13 DIAGNOSIS — I110 Hypertensive heart disease with heart failure: Secondary | ICD-10-CM | POA: Diagnosis present

## 2017-09-13 DIAGNOSIS — Z7901 Long term (current) use of anticoagulants: Secondary | ICD-10-CM | POA: Insufficient documentation

## 2017-09-13 DIAGNOSIS — I5032 Chronic diastolic (congestive) heart failure: Secondary | ICD-10-CM | POA: Insufficient documentation

## 2017-09-13 DIAGNOSIS — E039 Hypothyroidism, unspecified: Secondary | ICD-10-CM | POA: Diagnosis not present

## 2017-09-13 DIAGNOSIS — I2510 Atherosclerotic heart disease of native coronary artery without angina pectoris: Secondary | ICD-10-CM

## 2017-09-13 DIAGNOSIS — Z86718 Personal history of other venous thrombosis and embolism: Secondary | ICD-10-CM | POA: Insufficient documentation

## 2017-09-13 DIAGNOSIS — E213 Hyperparathyroidism, unspecified: Secondary | ICD-10-CM | POA: Insufficient documentation

## 2017-09-13 DIAGNOSIS — F419 Anxiety disorder, unspecified: Secondary | ICD-10-CM | POA: Diagnosis not present

## 2017-09-13 MED ORDER — EMPAGLIFLOZIN 10 MG PO TABS: 10.0000 mg | ORAL_TABLET | Freq: Every day | ORAL | 6 refills | Status: DC

## 2017-09-13 NOTE — Progress Notes (Signed)
Patient ID: Kathleen [name], female   DOB: Jul 01, 1955, 62 y.o.   MRN: [id number]    Advanced Heart Failure Clinic Note   Primary HF MD: Dr [name] PCP: Dr Rodney Booze   HPI Ms. Bannister is a 62 y/o woman (mother of [name] - CCU nurse) with a h/o morbid obesity, chronic diastolic HF, DM2, HTN, HL and CAD. Sleep study 11/2013 was negative. Also has history of A fib.    Had NSTEMI in 2/09 had cath at that time with occlusion of septal perforator. Otherwise normal arteries. Was admitted for CP in 11/13. CE normal. Underwent dobutamine stress echo which was normal.   Admitted 09/11/14 for Afib RVR. She converted to sinus tach with dilt drip and was transitioned to Toprol-XL 25 mg daily. She was in NSR at the time of discharge. Discharge weight was 325 lb.  Admitted 7/12-7/14/17 with chest pain. CTA negative for PE. Troponins negative.  Had Myoview 08/05/15 with "subtle" anterior wall defect. Reviewed by Dr. [name] at visit 08/25/15, thought to possibly be breast attenuation. Echo stable as below.   Admitted 12/6-12/82017 with CP and dyspnea. Cardiac work up was negative. Discharge weight was 334 pounds.   Holter monitor in 7/19: NSR with occasional PVCs and PACs. No other abnormalities. Multiple diary events which occasionally correspond to PVCs but often during NSR.   Seen in July for palpitations. Holter monitor as above. Now feeling better. Volume status well controlled. Weight stable around 315-320. No orthopnea or PND. No CP. Working with PT for right shoulder (had surgery in 2/19)    Echo 7/19 EF 55-60% Grade 1 DD RVSP 51mmHG  Echo 11/13 EF 60-65% Mild LAE. RV normal. No PH. No comment on diastolic function.  Echo 09/04/13 EF 55-60%  Grade I DD Echo 08/05/15 EF 55-60%  Grade I DD  Labs:  09/25/13 K 4.1 Creatinine 1.0 , LDL 79 10/21/2014: K 5.4 Creatinine 0.95  02/22/2015: K 4.4 Creatinine 1.08  03/30/2016: K 4.3 Creatinine 1.09  07/10/2017: K 4.4 Creatinine 0.9 TSH 5.28  Allergies  Allergen  Reactions  . Eggs Or Egg-Derived Products Hives and Other (See Comments)    Can eat foods with cooked eggs; CANNOT handle when part of flu vaccine, etc.   . Lamictal [Lamotrigine] Hives and Rash    Current Outpatient Medications  Medication Sig Dispense Refill  . albuterol (PROVENTIL HFA;VENTOLIN HFA) 108 (90 BASE) MCG/ACT inhaler Inhale 2 puffs into the lungs every 6 (six) hours as needed for wheezing. 1 Inhaler 1  . allopurinol (ZYLOPRIM) 100 MG tablet TAKE 200 MG BY MOUTH EVERY DAY 60 tablet 6  . benztropine (COGENTIN) 1 MG tablet Take 1 mg by mouth 2 (two) times daily.  2  . divalproex (DEPAKOTE ER) 500 MG 24 hr tablet Take 1,500 mg by mouth at bedtime.     . furosemide (LASIX) 80 MG tablet TAKE 1 TAB DAILY. MAY ALSO TAKE 1/2 TAB AS NEEDED FOR FLUID. PLEASE CALL [phone] FOR FOLLOW UP. 45 tablet 0  . LATUDA 60 MG TABS Take 60 mg by mouth at bedtime.     [name] lisinopril (PRINIVIL,ZESTRIL) 20 MG tablet TAKE 1 TABLET BY MOUTH EVERY DAY 90 tablet 3  . metFORMIN (GLUCOPHAGE) 500 MG tablet TAKE 1 TABLET (500 MG TOTAL) BY MOUTH 2 (TWO) TIMES DAILY WITH A MEAL. 180 tablet 1  . metoprolol tartrate (LOPRESSOR) 25 MG tablet Take 1 tablet (25 mg total) by mouth 2 (two) times daily. 180 tablet 3  . nitroGLYCERIN (NITROSTAT) 0.4  MG SL tablet Place 1 tablet (0.4 mg total) under the tongue every 5 (five) minutes as needed for chest pain. For chest pain 25 tablet 3  . rosuvastatin (CRESTOR) 20 MG tablet TAKE 20 MG BY MOUTH EVERY DAY 30 tablet 5  . triamcinolone ointment (KENALOG) 0.1 % APPLY TO AFFECTED AREA TWICE A DAY FOR 14 DAYS (Patient taking differently: Apply 1 application topically 2 (two) times daily as needed (for dry skin). ) 30 g 1  . UNABLE TO FIND Med Name: One Touch Verio blood glucose meter Lot #[id number] x Exp 01/22/2018    . XARELTO 20 MG TABS tablet TAKE 1 TABLET BY MOUTH EVERY DAY WITH SUPPER (Patient taking differently: Take 20 mg by mouth at bedtime) 30 tablet 11   No current  facility-administered medications for this encounter.     Past Medical History:  Diagnosis Date  . Anemia   . Anxiety   . Asthma   . Atrial fibrillation ([place])   . Bipolar disorder ([place])   . Bronchitis    hx of   . Cancer ([place])    basal cell carcinoma on right hand, papilloma left breast  . CHF (congestive heart failure) ([place])    pt seen at heart/vascular spec clinic 08/14/2014   . Coarse tremors    hands  . Coronary artery disease   . Depression   . DVT (deep vein thrombosis) in pregnancy ([place])    pt. reports that it was post knee surgery, not during pregnancy  . Dyslipidemia   . Dysrhythmia    A fib  . Fall   . Fatty liver   . Headache    migraines - stopped after menopause  . Heart attack ([place]) 02/2007   non-q-wave with second septal perforator  . Hyperlipidemia   . Hypertension   . Hypothyroidism    history of took medication, has resolved  . Knee pain, left   . Lower extremity deep venous thrombosis (HCC)    20 years ago   . Measles    hx of in childhood   . Morbid obesity with BMI of 40.0-44.9, adult ([place])   . Pain at surgical incision    Left breast from procedure 09/11/16  . Pneumonia    hx of   . PONV (postoperative nausea and vomiting)    also slow to wake up  . Psychiatric hospitalization 05/2008  . Shingles    20 years ago   . Shortness of breath dyspnea    exercise   . Type 2 diabetes mellitus ([place]) 12/31/2015  . Urinary incontinence   . Urinary tract bacterial infections     Past Surgical History:  Procedure Laterality Date  . BASAL CELL CARCINOMA EXCISION    . BREAST LUMPECTOMY WITH RADIOACTIVE SEED LOCALIZATION Left 09/11/2016   Procedure: LEFT BREAST LUMPECTOMY WITH RADIOACTIVE SEED LOCALIZATION;  Surgeon: Excell Seltzer, MD;  Location: [facility];  Service: General;  Laterality: Left;  . BREAST MASS EXCISION     age 66, benign tumor  . CARDIAC CATHETERIZATION    . CARDIAC CATHETERIZATION N/A 12/30/2015   Procedure: Left Heart Cath and  Coronary Angiography;  Surgeon: Belva Crome, MD;  Location: [facility];  Service: Cardiovascular;  Laterality: N/A;  . COLONOSCOPY    . DILATATION & CURETTAGE/HYSTEROSCOPY WITH MYOSURE N/A 09/22/2016   Procedure: DILATATION & CURETTAGE/HYSTEROSCOPY WITH MYOSURE;  Surgeon: Molli Posey, MD;  Location: [facility];  Service: Gynecology;  Laterality: N/A;  . DILATION AND CURETTAGE OF UTERUS    .  KNEE SURGERY     right, removed cartilage  . PARATHYROIDECTOMY N/A 08/25/2014   Procedure: PARATHYROIDECTOMY;  Surgeon: Armandina Gemma, MD;  Location: [facility];  Service: General;  Laterality: N/A;  . SHOULDER ARTHROSCOPY WITH ROTATOR CUFF REPAIR AND SUBACROMIAL DECOMPRESSION Right 02/27/2017   Procedure: Right shoulder arthroscopic rotator cuff repair with biceps tenodesis and subacromial decompression;  Surgeon: Nicholes Stairs, MD;  Location: [facility];  Service: Orthopedics;  Laterality: Right;  150 mins  . TONSILLECTOMY    . TUBAL LIGATION      ROS:  As stated in the HPI and negative for all other systems.  PHYSICAL EXAM BP (!) 150/96   Pulse 70   Wt (!) 146 kg (321 lb 12.8 oz)   SpO2 96%   BMI 44.88 kg/m    Wt Readings from Last 3 Encounters:  09/13/17 (!) 146 kg (321 lb 12.8 oz)  07/30/17 (!) 145.4 kg (320 lb 9.6 oz)  07/25/17 (!) 145.5 kg (320 lb 12.8 oz)    General:  Well appearing. No resp difficulty HEENT: normal Neck: supple. no JVD. Carotids 2+ bilat; no bruits. No lymphadenopathy or thryomegaly appreciated. Cor: PMI nondisplaced. Regular rate & rhythm. No rubs, gallops or murmurs. Lungs: clear Abdomen: obese soft, nontender, nondistended. No hepatosplenomegaly. No bruits or masses. Good bowel sounds. Extremities: no cyanosis, clubbing, rash, edema. + chronic venous stasis changes Neuro: alert & orientedx3, cranial nerves grossly intact. moves all 4 extremities w/o difficulty. Affect pleasant  ASSESSMENT AND PLAN  1. Chronic diastolic HF:  -ECHO 09/5186 EF Normal EF 55-60%  Grade IDD  - Echo 7/19 EF 55-60% Personally reviewed - Volume status ok   2. PAF - CHADSVASC = 3. - In NSR. Continue bb and xarelto.   3. Morbid obesity: Body mass index is 44.88 kg/m.  4. CAD:  - No s/s ischemia - Continue ASA 81, Plavix, statin.    5. HTN:   - BP remains elevated. Will add Jardiance and see if improves.  - Can add spiro 12.5 down the road if needed  6. Snoring:  - No OSA on sleep study.   7. Hyperparathyroidism-   - s/p partial parathyroidectomy 08/2014. Per PCP.   8. Palpitations - Holter monitor 7/19 with PACs and PVCs no sustained arryhythmias - Improved  9. DM2 - Start Jardiance 10 mg daily     Glori Bickers MD 09/13/2017

## 2017-09-13 NOTE — Patient Instructions (Signed)
Start Jardiance 10 mg daily  Your physician recommends that you schedule a follow-up appointment in: 4-6 months

## 2017-09-13 NOTE — Addendum Note (Signed)
Encounter addended by: Scarlette Calico, RN on: 09/13/2017 1:41 PM  Actions taken: Sign clinical note, Order list changed

## 2017-10-01 ENCOUNTER — Encounter: Payer: Self-pay | Admitting: Family Medicine

## 2017-10-29 ENCOUNTER — Encounter: Payer: Self-pay | Admitting: Family Medicine

## 2017-10-29 ENCOUNTER — Other Ambulatory Visit (HOSPITAL_COMMUNITY): Payer: Self-pay

## 2017-10-29 ENCOUNTER — Ambulatory Visit: Payer: BC Managed Care – PPO | Admitting: Family Medicine

## 2017-10-29 VITALS — BP 130/88 | HR 70 | Temp 97.7°F | Resp 20 | Ht 71.0 in | Wt 321.0 lb

## 2017-10-29 DIAGNOSIS — L03116 Cellulitis of left lower limb: Secondary | ICD-10-CM | POA: Diagnosis not present

## 2017-10-29 DIAGNOSIS — E119 Type 2 diabetes mellitus without complications: Secondary | ICD-10-CM

## 2017-10-29 LAB — BASIC METABOLIC PANEL
BUN: 23 mg/dL (ref 6–23)
CHLORIDE: 102 meq/L (ref 96–112)
CO2: 28 mEq/L (ref 19–32)
CREATININE: 0.91 mg/dL (ref 0.40–1.20)
Calcium: 9.4 mg/dL (ref 8.4–10.5)
GFR: 66.56 mL/min (ref >60.00)
GLUCOSE: 78 mg/dL (ref 70–99)
POTASSIUM: 4.3 meq/L (ref 3.5–5.1)
Sodium: 140 mEq/L (ref 135–145)

## 2017-10-29 LAB — HEMOGLOBIN A1C: HEMOGLOBIN A1C: 5.9 % (ref 4.6–6.5)

## 2017-10-29 MED ORDER — DOXYCYCLINE HYCLATE 100 MG PO CAPS: 100.0000 mg | ORAL_CAPSULE | Freq: Two times a day (BID) | ORAL | 0 refills | Status: DC

## 2017-10-29 MED ORDER — RIVAROXABAN 20 MG PO TABS: ORAL_TABLET | ORAL | 5 refills | Status: DC

## 2017-10-29 NOTE — Patient Instructions (Signed)
Good to see you today- I will be in touch with your A1c asap Let's use doxycycline antibiotic twice a day for 10 days for your leg If not getting better or if getting worse- more red or tender, more swollen, fever-  please alert me BP looks good today

## 2017-10-29 NOTE — Progress Notes (Addendum)
[facility] at [facility] [address], [place], [place] [address] [phone]  Date:  10/29/2017   Name:  Kathleen [name]   DOB:  [birth year]   MRN:  [id number]  PCP:  Darreld Mclean, MD    Chief Complaint: Abrasion (left lower leg, scraped leg on wednesday with shoe, discharge, blisters around wound)   History of Present Illness:  Kathleen [name] is a 62 y.o. very pleasant female patient who presents with the following:  Here today with an acute concern.  This past Wednesday- 5 days ago- she scraped her left left shin with the opposite shoe and sustained a small skin tear.  She is concerned now about infection of the area, has has cellulitis here in the past. The area hurts but does not itch, and she now has a couple of a small blisters near the skin tear  No fever or chills Overall she feels well   She feels ike the redness around the wound is spreading  she is taking lasix 80 mg once a day right now and her weight/ swelling/ breathing are ok No SOB  Flu shot done already    BP Readings from Last 3 Encounters:  10/29/17 130/88  09/13/17 (!) 150/96  07/30/17 128/84   Wt Readings from Last 3 Encounters:  10/29/17 (!) 321 lb (145.6 kg)  09/13/17 (!) 321 lb 12.8 oz (146 kg)  07/30/17 (!) 320 lb 9.6 oz (145.4 kg)    I last saw her in July History of gout, DM, hyperlipidemia, CAD/ CHF--she is an advanced CHF clinic pt, recent visit with them from 8/22 [place] 1. Chronic diastolic HF:  -ECHO 07/9022 EF Normal EF 55-60% Grade IDD  - Echo 7/19 EF 55-60% Personally reviewed - Volume status ok  2. PAF - CHADSVASC = 3. - In NSR. Continue bb and xarelto. 3. Morbid obesity: Body mass index is 44.88 kg/m. 4. CAD:  - No s/s ischemia - Continue ASA 81, Plavix, statin. 5. HTN:   - BP remains elevated. Will add Jardiance and see if improves.  - Can add spiro 12.5 down the road if needed 6. Snoring:  - No OSA on sleep  study.  7. Hyperparathyroidism-   - s/p partial parathyroidectomy 08/2014. Per PCP.  8. Palpitations - Holter monitor 7/19 with PACs and PVCs no sustained arryhythmias - Improved 9. DM2 - Start Jardiance 10 mg daily   Lab Results  Component Value Date   HGBA1C 5.9 07/10/2017     Patient Active Problem List   Diagnosis Date Noted  . Complete rotator cuff tear or rupture of right shoulder, not specified as traumatic 02/27/2017  . Auditory hallucination 12/13/2016  . Tremor of both hands 11/20/2016  . Gout 06/29/2016  . Type 2 diabetes mellitus ([place]) 12/31/2015  . Chest pain with moderate risk for cardiac etiology 12/29/2015  . Coronary artery dissection   . Pure hypercholesterolemia   . Lung nodule seen on imaging study 09/24/2014  . Paroxysmal atrial fibrillation ([place]) 09/11/2014  . Asthmatic bronchitis with exacerbation 05/25/2014  . Primary hyperparathyroidism ([place]) 04/15/2014  . Palpitations 03/20/2014  . Radicular low back pain 11/24/2013  . Chronic diastolic heart failure ([place]) 10/02/2013  . Shortness of breath 08/31/2013  . Venous stasis dermatitis of both lower extremities 08/19/2013  . Peripheral edema 06/12/2013  . Other malaise and fatigue 06/12/2013  . Nocturia 06/12/2013  . Cellulitis of leg, right 10/11/2012  .  Morbid obesity ([place]) 08/09/2012  . Urinary incontinence, urge 07/11/2012  . CAD (coronary artery disease) 10/31/2010  . NEOPLASM OF UNCERTAIN BEHAVIOR OF SKIN 02/11/2010  . Bipolar disorder ([place]) 09/14/2009  . ACUT MYOCARD INFARCT UNS SITE SUBSQT EPIS CARE 07/02/2008  . EDEMA 06/23/2008  . Hyperlipidemia 05/23/2008  . UNSPECIFIED DISORDER OF THYROID 02/04/2008  . Obesity 02/04/2008  . HYPERTENSION, BENIGN ESSENTIAL 02/04/2008  . MYOCARDIAL INFARCTION, HX OF 03/05/2007    Past Medical History:  Diagnosis Date  . Anemia   . Anxiety   . Asthma   . Atrial fibrillation ([place])   . Bipolar disorder ([place])   . Bronchitis    hx of   . Cancer ([place])     basal cell carcinoma on right hand, papilloma left breast  . CHF (congestive heart failure) ([place])    pt seen at heart/vascular spec clinic 08/14/2014   . Coarse tremors    hands  . Coronary artery disease   . Depression   . DVT (deep vein thrombosis) in pregnancy    pt. reports that it was post knee surgery, not during pregnancy  . Dyslipidemia   . Dysrhythmia    A fib  . Fall   . Fatty liver   . Headache    migraines - stopped after menopause  . Heart attack ([place]) 02/2007   non-q-wave with second septal perforator  . Hyperlipidemia   . Hypertension   . Hypothyroidism    history of took medication, has resolved  . Knee pain, left   . Lower extremity deep venous thrombosis (HCC)    20 years ago   . Measles    hx of in childhood   . Morbid obesity with BMI of 40.0-44.9, adult ([place])   . Pain at surgical incision    Left breast from procedure 09/11/16  . Pneumonia    hx of   . PONV (postoperative nausea and vomiting)    also slow to wake up  . Psychiatric hospitalization 05/2008  . Shingles    20 years ago   . Shortness of breath dyspnea    exercise   . Type 2 diabetes mellitus ([place]) 12/31/2015  . Urinary incontinence   . Urinary tract bacterial infections     Past Surgical History:  Procedure Laterality Date  . BASAL CELL CARCINOMA EXCISION    . BREAST LUMPECTOMY WITH RADIOACTIVE SEED LOCALIZATION Left 09/11/2016   Procedure: LEFT BREAST LUMPECTOMY WITH RADIOACTIVE SEED LOCALIZATION;  Surgeon: Excell Seltzer, MD;  Location: [facility];  Service: General;  Laterality: Left;  . BREAST MASS EXCISION     age 60, benign tumor  . CARDIAC CATHETERIZATION    . CARDIAC CATHETERIZATION N/A 12/30/2015   Procedure: Left Heart Cath and Coronary Angiography;  Surgeon: Belva Crome, MD;  Location: [facility];  Service: Cardiovascular;  Laterality: N/A;  . COLONOSCOPY    . DILATATION & CURETTAGE/HYSTEROSCOPY WITH MYOSURE N/A 09/22/2016   Procedure: DILATATION &  CURETTAGE/HYSTEROSCOPY WITH MYOSURE;  Surgeon: Molli Posey, MD;  Location: [facility];  Service: Gynecology;  Laterality: N/A;  . DILATION AND CURETTAGE OF UTERUS    . KNEE SURGERY     right, removed cartilage  . PARATHYROIDECTOMY N/A 08/25/2014   Procedure: PARATHYROIDECTOMY;  Surgeon: Armandina Gemma, MD;  Location: [facility];  Service: General;  Laterality: N/A;  . SHOULDER ARTHROSCOPY WITH ROTATOR CUFF REPAIR AND SUBACROMIAL DECOMPRESSION Right 02/27/2017   Procedure: Right shoulder arthroscopic rotator cuff repair with biceps tenodesis and subacromial decompression;  Surgeon: Nicholes Stairs, MD;  Location: [facility];  Service: Orthopedics;  Laterality: Right;  150 mins  . TONSILLECTOMY    . TUBAL LIGATION      Social History   Tobacco Use  . Smoking status: Never Smoker  . Smokeless tobacco: Never Used  Substance Use Topics  . Alcohol use: Yes    Alcohol/week: 0.0 standard drinks    Comment: occ.  . Drug use: No    Family History  Problem Relation Age of Onset  . Breast cancer Mother 53  . Alcohol abuse Mother   . Alzheimer's disease Mother   . Alcohol abuse Father   . Alzheimer's disease Father   . Parkinson's disease Father   . Alcohol abuse Maternal Grandfather   . Drug abuse Maternal Grandfather   . Alcohol abuse Maternal Grandmother   . Alcohol abuse Paternal Grandfather   . Alcohol abuse Paternal Grandmother   . Schizophrenia Cousin   . Diabetes Brother   . Hypertension Brother     Allergies  Allergen Reactions  . Eggs Or Egg-Derived Products Hives and Other (See Comments)    Can eat foods with cooked eggs; CANNOT handle when part of flu vaccine, etc.   . Lamictal [Lamotrigine] Hives and Rash    Medication list has been reviewed and updated.  Current Outpatient Medications on File Prior to Visit  Medication Sig Dispense Refill  . albuterol (PROVENTIL HFA;VENTOLIN HFA) 108 (90 BASE) MCG/ACT inhaler Inhale 2 puffs into the lungs every 6 (six) hours as needed for  wheezing. 1 Inhaler 1  . allopurinol (ZYLOPRIM) 100 MG tablet TAKE 200 MG BY MOUTH EVERY DAY 60 tablet 6  . benztropine (COGENTIN) 1 MG tablet Take 1 mg by mouth 2 (two) times daily.  2  . divalproex (DEPAKOTE ER) 500 MG 24 hr tablet Take 1,500 mg by mouth at bedtime.     . empagliflozin (JARDIANCE) 10 MG TABS tablet Take 10 mg by mouth daily. 30 tablet 6  . furosemide (LASIX) 80 MG tablet TAKE 1 TAB DAILY. MAY ALSO TAKE 1/2 TAB AS NEEDED FOR FLUID. PLEASE CALL [phone] FOR FOLLOW UP. 45 tablet 0  . LATUDA 60 MG TABS Take 60 mg by mouth at bedtime.     [name] lisinopril (PRINIVIL,ZESTRIL) 20 MG tablet TAKE 1 TABLET BY MOUTH EVERY DAY 90 tablet 3  . metFORMIN (GLUCOPHAGE) 500 MG tablet TAKE 1 TABLET (500 MG TOTAL) BY MOUTH 2 (TWO) TIMES DAILY WITH A MEAL. 180 tablet 1  . metoprolol tartrate (LOPRESSOR) 25 MG tablet Take 1 tablet (25 mg total) by mouth 2 (two) times daily. 180 tablet 3  . nitroGLYCERIN (NITROSTAT) 0.4 MG SL tablet Place 1 tablet (0.4 mg total) under the tongue every 5 (five) minutes as needed for chest pain. For chest pain 25 tablet 3  . rosuvastatin (CRESTOR) 20 MG tablet TAKE 20 MG BY MOUTH EVERY DAY 30 tablet 5  . triamcinolone ointment (KENALOG) 0.1 % APPLY TO AFFECTED AREA TWICE A DAY FOR 14 DAYS (Patient taking differently: Apply 1 application topically 2 (two) times daily as needed (for dry skin). ) 30 g 1  . UNABLE TO FIND Med Name: One Touch Verio blood glucose meter Lot #[id number] x Exp 01/22/2018    . XARELTO 20 MG TABS tablet TAKE 1 TABLET BY MOUTH EVERY DAY WITH SUPPER (Patient taking differently: Take 20 mg by mouth at bedtime) 30 tablet 11   No current facility-administered medications on file prior to visit.  Review of Systems:  As per HPI- otherwise negative.   Physical Examination: Vitals:   10/29/17 1125  BP: 130/88  Pulse: 70  Resp: 20  Temp: 97.7 F (36.5 C)  SpO2: 98%   Vitals:   10/29/17 1125  Weight: (!) 321 lb (145.6 kg)  Height: 5\' 11"   (1.803 m)   Body mass index is 44.77 kg/m. Ideal Body Weight: Weight in (lb) to have BMI = 25: 178.9  GEN: WDWN, NAD, Non-toxic, A & O x 3, obese, looks well  HEENT: Atraumatic, Normocephalic. Neck supple. No masses, No LAD. Ears and Nose: No external deformity. CV: RRR, No M/G/R. No JVD. No thrill. No extra heart sounds. PULM: CTA B, no wheezes, crackles, rhonchi. No retractions. No resp. distress. No accessory muscle use. EXTR: No c/c/e NEURO Normal gait.  PSYCH: Normally interactive. Conversant. Not depressed or anxious appearing.  Calm demeanor.  Foot exam normal bilaterally She has valgus deformity at both knees Chronic hyperpigmentation at bilateral medial ankles from chronic edema  On the left ankle there is a dime sized skin tear with minimal surrounding erythema  2 tiny blisters are also adjacent    Assessment and Plan: Type 2 diabetes mellitus without complication, unspecified whether long term insulin use ([place]) - Plan: Basic metabolic panel, Hemoglobin A1c  Cellulitis of left lower extremity - Plan: doxycycline (VIBRAMYCIN) 100 MG capsule  Follow-up on routine DM labs for her today Recnet injury to left shin and mild cellulitis- start on doxycycline for 10 days  She will monitor closely and let me know if not improving Discussed wound care as well   Signed Lamar Blinks, MD  Received her labs 10/8  Results for orders placed or performed in visit on 17/49/44  Basic metabolic panel  Result Value Ref Range   Sodium 140 135 - 145 mEq/L   Potassium 4.3 3.5 - 5.1 mEq/L   Chloride 102 96 - 112 mEq/L   CO2 28 19 - 32 mEq/L   Glucose, Bld 78 70 - 99 mg/dL   BUN 23 6 - 23 mg/dL   Creatinine, Ser 0.91 0.40 - 1.20 mg/dL   Calcium 9.4 8.4 - 10.5 mg/dL   GFR 66.56 >60.00 mL/min  Hemoglobin A1c  Result Value Ref Range   Hgb A1c MFr Bld 5.9 4.6 - 6.5 %   Message to pt

## 2017-10-30 ENCOUNTER — Encounter: Payer: Self-pay | Admitting: Family Medicine

## 2017-10-31 ENCOUNTER — Ambulatory Visit: Payer: BC Managed Care – PPO | Admitting: Family Medicine

## 2017-10-31 ENCOUNTER — Encounter: Payer: Self-pay | Admitting: Family Medicine

## 2017-10-31 VITALS — BP 126/82 | HR 76 | Temp 98.1°F | Resp 20 | Ht 71.0 in | Wt 321.0 lb

## 2017-10-31 DIAGNOSIS — L03116 Cellulitis of left lower limb: Secondary | ICD-10-CM | POA: Diagnosis not present

## 2017-10-31 MED ORDER — DICLOXACILLIN SODIUM 250 MG PO CAPS: 250.0000 mg | ORAL_CAPSULE | Freq: Four times a day (QID) | ORAL | 0 refills | Status: DC

## 2017-10-31 MED ORDER — CEPHALEXIN 500 MG PO CAPS: 500.0000 mg | ORAL_CAPSULE | Freq: Three times a day (TID) | ORAL | 0 refills | Status: DC

## 2017-10-31 NOTE — Patient Instructions (Addendum)
We are going to add dicloxacillin 250 4x a day to your doxycycline for your leg infection while culture is pending.  Take both for now until I get your culture back Please take a probiotic to help prevent diarrhea, and daily yogurt is also a good idea Please keep me closely posted about your progress

## 2017-10-31 NOTE — Telephone Encounter (Signed)
Called her back- her leg is more red and painful No fever She is otherwise feeling ok  She will come in at 2:30 to be seen

## 2017-10-31 NOTE — Progress Notes (Signed)
[facility] at [facility] [place], [place], [place] [address] [phone]  Date:  10/31/2017   Name:  Kathleen [name]   DOB:  01-22-1956   MRN:  [id number]  PCP:  Darreld Mclean, MD    Chief Complaint: Wound Check (left leg, oozing, redness, spreading, taking doxycycline)   History of Present Illness:  Kathleen [name] is a 62 y.o. very pleasant female patient who presents with the following:  Seen on Monday with concern of cellulitis of her left shin, we started doxycycline  She had contacted me today with concern of worsening so we brought her in for a recheck She felt a bit tired yesterday but not sick per so No fever noted Tolerating doxy ok   Patient Active Problem List   Diagnosis Date Noted  . Complete rotator cuff tear or rupture of right shoulder, not specified as traumatic 02/27/2017  . Auditory hallucination 12/13/2016  . Tremor of both hands 11/20/2016  . Gout 06/29/2016  . Type 2 diabetes mellitus ([place]) 12/31/2015  . Chest pain with moderate risk for cardiac etiology 12/29/2015  . Coronary artery dissection   . Pure hypercholesterolemia   . Lung nodule seen on imaging study 09/24/2014  . Paroxysmal atrial fibrillation ([place]) 09/11/2014  . Asthmatic bronchitis with exacerbation 05/25/2014  . Primary hyperparathyroidism ([place]) 04/15/2014  . Palpitations 03/20/2014  . Radicular low back pain 11/24/2013  . Chronic diastolic heart failure ([place]) 10/02/2013  . Shortness of breath 08/31/2013  . Venous stasis dermatitis of both lower extremities 08/19/2013  . Peripheral edema 06/12/2013  . Other malaise and fatigue 06/12/2013  . Nocturia 06/12/2013  . Cellulitis of leg, right 10/11/2012  . Morbid obesity ([place]) 08/09/2012  . Urinary incontinence, urge 07/11/2012  . CAD (coronary artery disease) 10/31/2010  . NEOPLASM OF UNCERTAIN BEHAVIOR OF SKIN 02/11/2010  . Bipolar disorder ([place]) 09/14/2009  . ACUT MYOCARD  INFARCT UNS SITE SUBSQT EPIS CARE 07/02/2008  . EDEMA 06/23/2008  . Hyperlipidemia 05/23/2008  . UNSPECIFIED DISORDER OF THYROID 02/04/2008  . Obesity 02/04/2008  . HYPERTENSION, BENIGN ESSENTIAL 02/04/2008  . MYOCARDIAL INFARCTION, HX OF 03/05/2007    Past Medical History:  Diagnosis Date  . Anemia   . Anxiety   . Asthma   . Atrial fibrillation ([place])   . Bipolar disorder ([place])   . Bronchitis    hx of   . Cancer ([place])    basal cell carcinoma on right hand, papilloma left breast  . CHF (congestive heart failure) ([place])    pt seen at heart/vascular spec clinic 08/14/2014   . Coarse tremors    hands  . Coronary artery disease   . Depression   . DVT (deep vein thrombosis) in pregnancy    pt. reports that it was post knee surgery, not during pregnancy  . Dyslipidemia   . Dysrhythmia    A fib  . Fall   . Fatty liver   . Headache    migraines - stopped after menopause  . Heart attack ([place]) 02/2007   non-q-wave with second septal perforator  . Hyperlipidemia   . Hypertension   . Hypothyroidism    history of took medication, has resolved  . Knee pain, left   . Lower extremity deep venous thrombosis (HCC)    20 years ago   . Measles    hx of in childhood   . Morbid obesity with BMI of 40.0-44.9, adult ([place])   .  Pain at surgical incision    Left breast from procedure 09/11/16  . Pneumonia    hx of   . PONV (postoperative nausea and vomiting)    also slow to wake up  . Psychiatric hospitalization 05/2008  . Shingles    20 years ago   . Shortness of breath dyspnea    exercise   . Type 2 diabetes mellitus ([place]) 12/31/2015  . Urinary incontinence   . Urinary tract bacterial infections     Past Surgical History:  Procedure Laterality Date  . BASAL CELL CARCINOMA EXCISION    . BREAST LUMPECTOMY WITH RADIOACTIVE SEED LOCALIZATION Left 09/11/2016   Procedure: LEFT BREAST LUMPECTOMY WITH RADIOACTIVE SEED LOCALIZATION;  Surgeon: Excell Seltzer, MD;  Location: [facility];   Service: General;  Laterality: Left;  . BREAST MASS EXCISION     age 79, benign tumor  . CARDIAC CATHETERIZATION    . CARDIAC CATHETERIZATION N/A 12/30/2015   Procedure: Left Heart Cath and Coronary Angiography;  Surgeon: Belva Crome, MD;  Location: [facility];  Service: Cardiovascular;  Laterality: N/A;  . COLONOSCOPY    . DILATATION & CURETTAGE/HYSTEROSCOPY WITH MYOSURE N/A 09/22/2016   Procedure: DILATATION & CURETTAGE/HYSTEROSCOPY WITH MYOSURE;  Surgeon: Molli Posey, MD;  Location: [facility];  Service: Gynecology;  Laterality: N/A;  . DILATION AND CURETTAGE OF UTERUS    . KNEE SURGERY     right, removed cartilage  . PARATHYROIDECTOMY N/A 08/25/2014   Procedure: PARATHYROIDECTOMY;  Surgeon: Armandina Gemma, MD;  Location: [facility];  Service: General;  Laterality: N/A;  . SHOULDER ARTHROSCOPY WITH ROTATOR CUFF REPAIR AND SUBACROMIAL DECOMPRESSION Right 02/27/2017   Procedure: Right shoulder arthroscopic rotator cuff repair with biceps tenodesis and subacromial decompression;  Surgeon: Nicholes Stairs, MD;  Location: [facility];  Service: Orthopedics;  Laterality: Right;  150 mins  . TONSILLECTOMY    . TUBAL LIGATION      Social History   Tobacco Use  . Smoking status: Never Smoker  . Smokeless tobacco: Never Used  Substance Use Topics  . Alcohol use: Yes    Alcohol/week: 0.0 standard drinks    Comment: occ.  . Drug use: No    Family History  Problem Relation Age of Onset  . Breast cancer Mother 50  . Alcohol abuse Mother   . Alzheimer's disease Mother   . Alcohol abuse Father   . Alzheimer's disease Father   . Parkinson's disease Father   . Alcohol abuse Maternal Grandfather   . Drug abuse Maternal Grandfather   . Alcohol abuse Maternal Grandmother   . Alcohol abuse Paternal Grandfather   . Alcohol abuse Paternal Grandmother   . Schizophrenia Cousin   . Diabetes Brother   . Hypertension Brother     Allergies  Allergen Reactions  . Eggs Or Egg-Derived Products  Hives and Other (See Comments)    Can eat foods with cooked eggs; CANNOT handle when part of flu vaccine, etc.   . Lamictal [Lamotrigine] Hives and Rash    Medication list has been reviewed and updated.  Current Outpatient Medications on File Prior to Visit  Medication Sig Dispense Refill  . albuterol (PROVENTIL HFA;VENTOLIN HFA) 108 (90 BASE) MCG/ACT inhaler Inhale 2 puffs into the lungs every 6 (six) hours as needed for wheezing. 1 Inhaler 1  . allopurinol (ZYLOPRIM) 100 MG tablet TAKE 200 MG BY MOUTH EVERY DAY 60 tablet 6  . benztropine (COGENTIN) 1 MG tablet Take 1 mg by mouth 2 (two) times daily.  2  . divalproex (DEPAKOTE ER) 500 MG 24 hr tablet Take 1,500 mg by mouth at bedtime.     [name] doxycycline (VIBRAMYCIN) 100 MG capsule Take 1 capsule (100 mg total) by mouth 2 (two) times daily. 20 capsule 0  . empagliflozin (JARDIANCE) 10 MG TABS tablet Take 10 mg by mouth daily. 30 tablet 6  . furosemide (LASIX) 80 MG tablet TAKE 1 TAB DAILY. MAY ALSO TAKE 1/2 TAB AS NEEDED FOR FLUID. PLEASE CALL [phone] FOR FOLLOW UP. 45 tablet 0  . LATUDA 60 MG TABS Take 60 mg by mouth at bedtime.     [name] lisinopril (PRINIVIL,ZESTRIL) 20 MG tablet TAKE 1 TABLET BY MOUTH EVERY DAY 90 tablet 3  . metFORMIN (GLUCOPHAGE) 500 MG tablet TAKE 1 TABLET (500 MG TOTAL) BY MOUTH 2 (TWO) TIMES DAILY WITH A MEAL. 180 tablet 1  . metoprolol tartrate (LOPRESSOR) 25 MG tablet Take 1 tablet (25 mg total) by mouth 2 (two) times daily. 180 tablet 3  . nitroGLYCERIN (NITROSTAT) 0.4 MG SL tablet Place 1 tablet (0.4 mg total) under the tongue every 5 (five) minutes as needed for chest pain. For chest pain 25 tablet 3  . rivaroxaban (XARELTO) 20 MG TABS tablet Take 20 mg by mouth at bedtime 30 tablet 5  . rosuvastatin (CRESTOR) 20 MG tablet TAKE 20 MG BY MOUTH EVERY DAY 30 tablet 5  . triamcinolone ointment (KENALOG) 0.1 % APPLY TO AFFECTED AREA TWICE A DAY FOR 14 DAYS (Patient taking differently: Apply 1 application topically 2 (two)  times daily as needed (for dry skin). ) 30 g 1  . UNABLE TO FIND Med Name: One Touch Verio blood glucose meter Lot #[id number] x Exp 01/22/2018     No current facility-administered medications on file prior to visit.     Review of Systems:  As per HPI- otherwise negative.   Physical Examination: Vitals:   10/31/17 1428  BP: 126/82  Pulse: 76  Resp: 20  Temp: 98.1 F (36.7 C)  SpO2: 98%   Vitals:   10/31/17 1428  Weight: (!) 321 lb (145.6 kg)  Height: 5\' 11"  (1.803 m)   Body mass index is 44.77 kg/m. Ideal Body Weight: Weight in (lb) to have BMI = 25: 178.9  GEN: WDWN, NAD, Non-toxic, A & O x 3, obese, looks well HEENT: Atraumatic, Normocephalic. Neck supple. No masses, No LAD. Ears and Nose: No external deformity. CV: RRR, No M/G/R. No JVD. No thrill. No extra heart sounds. PULM: CTA B, no wheezes, crackles, rhonchi. No retractions. No resp. distress. No accessory muscle use. EXTR: No c/c/e NEURO Normal gait.  PSYCH: Normally interactive. Conversant. Not depressed or anxious appearing.  Calm demeanor.  Left shin: small skin tear appears about the same as on Monday but there is perhaps minimal increase in surrounding mild redness Prepped with betadine and then attempted to get some fluid from wound for culture.  Able to get a bit and will send     Assessment and Plan: Cellulitis of left lower extremity - Plan: WOUND CULTURE, dicloxacillin (DYNAPEN) 250 MG capsule, DISCONTINUED: cephALEXin (KEFLEX) 500 MG capsule  Cellulitis of left leg Continue doxycycline Will add dicloxacillin for better strep coverage Prevention of diarrhea discussed with pt Await culture result She will keep me updated about her progress    Signed Lamar Blinks, MD

## 2017-11-03 LAB — WOUND CULTURE
MICRO NUMBER:: 91214542
RESULT:: NO GROWTH
SPECIMEN QUALITY: ADEQUATE

## 2017-11-19 ENCOUNTER — Ambulatory Visit: Payer: Self-pay | Admitting: Family Medicine

## 2017-12-13 ENCOUNTER — Ambulatory Visit: Payer: BC Managed Care – PPO | Admitting: Family Medicine

## 2017-12-13 ENCOUNTER — Encounter: Payer: Self-pay | Admitting: Family Medicine

## 2017-12-13 VITALS — BP 126/60 | HR 83 | Resp 18 | Ht 71.0 in | Wt 323.0 lb

## 2017-12-13 DIAGNOSIS — M79671 Pain in right foot: Secondary | ICD-10-CM

## 2017-12-13 DIAGNOSIS — G5793 Unspecified mononeuropathy of bilateral lower limbs: Secondary | ICD-10-CM | POA: Diagnosis not present

## 2017-12-13 DIAGNOSIS — E119 Type 2 diabetes mellitus without complications: Secondary | ICD-10-CM | POA: Diagnosis not present

## 2017-12-13 DIAGNOSIS — D751 Secondary polycythemia: Secondary | ICD-10-CM

## 2017-12-13 DIAGNOSIS — M79672 Pain in left foot: Secondary | ICD-10-CM

## 2017-12-13 LAB — CBC
HCT: 48.3 % — ABNORMAL HIGH (ref 36.0–46.0)
HEMOGLOBIN: 16.1 g/dL — AB (ref 12.0–15.0)
MCHC: 33.4 g/dL (ref 30.0–36.0)
MCV: 88.3 fl (ref 78.0–100.0)
Platelets: 147 10*3/uL — ABNORMAL LOW (ref 150.0–400.0)
RBC: 5.47 Mil/uL — ABNORMAL HIGH (ref 3.87–5.11)
RDW: 14.7 % (ref 11.5–15.5)
WBC: 6.3 10*3/uL (ref 4.0–10.5)

## 2017-12-13 LAB — COMPREHENSIVE METABOLIC PANEL
ALK PHOS: 99 U/L (ref 39–117)
ALT: 20 U/L (ref 0–35)
AST: 15 U/L (ref 0–37)
Albumin: 4.3 g/dL (ref 3.5–5.2)
BUN: 22 mg/dL (ref 6–23)
CHLORIDE: 104 meq/L (ref 96–112)
CO2: 29 meq/L (ref 19–32)
Calcium: 9.2 mg/dL (ref 8.4–10.5)
Creatinine, Ser: 0.94 mg/dL (ref 0.40–1.20)
GFR: 64.09 mL/min (ref >60.00)
Glucose, Bld: 124 mg/dL — ABNORMAL HIGH (ref 70–99)
POTASSIUM: 4.2 meq/L (ref 3.5–5.1)
SODIUM: 143 meq/L (ref 135–145)
TOTAL PROTEIN: 6.5 g/dL (ref 6.0–8.3)
Total Bilirubin: 0.5 mg/dL (ref 0.2–1.2)

## 2017-12-13 LAB — FERRITIN: Ferritin: 68.3 ng/mL (ref 10.0–291.0)

## 2017-12-13 LAB — VITAMIN B12: Vitamin B-12: 812 pg/mL (ref 211–911)

## 2017-12-13 LAB — FOLATE: Folate: 12.6 ng/mL (ref >5.9)

## 2017-12-13 NOTE — Patient Instructions (Signed)
Good to see you today- we will get some labs for you today to look for any cause of your food symptoms besides nerve damage.  I will also set you up for ABI testing to check on the blood pressure in your feet   Depending on these results we will plan the next step

## 2017-12-13 NOTE — Progress Notes (Signed)
[facility] at [facility] [place], [place], [place] [address] [phone]  Date:  12/13/2017   Name:  Kathleen [name]   DOB:  November 01, 1955   MRN:  [id number]  PCP:  Darreld Mclean, MD    Chief Complaint: Numbness in Feet (pain, numbness in feet, left worse than right, one month, pain when walking, stabbing pain)   History of Present Illness:  Kathleen [name] is a 62 y.o. very pleasant female patient who presents with the following:  History of DM, obesity, CAD, a fib Last month she had cellulitis on her left shin but this is now better She now has sharp, prickling pains in both of her feet for a month or so Sx are present in the soles of her feet Bilateral but left is worse than right At rest she does not have any pain Some numbness in her feet with long periods of standing Some swelling of her ankles off and on for a month or so - mild but present  She has a long history of venous stasis derm on both legs. She describes seeing Dr. Renaldo Reel when this first occurred and having a procedure done in which he evaluated her veins but I am not quite sure what she had done from her description  DM since 2017- however her A1c has always been under very good control.   Lab Results  Component Value Date   HGBA1C 5.9 10/29/2017   She does not have any claudication type sx She will get leg cramps in the calves at night however over the last month or so No fever or chills No change in her routine, no increased walking She generally wears quite supportive athletic type shoes  She is still recovering from right RCT repair done in February of this year  Patient Active Problem List   Diagnosis Date Noted  . Complete rotator cuff tear or rupture of right shoulder, not specified as traumatic 02/27/2017  . Auditory hallucination 12/13/2016  . Tremor of both hands 11/20/2016  . Gout 06/29/2016  . Type 2 diabetes mellitus ([place]) 12/31/2015   . Chest pain with moderate risk for cardiac etiology 12/29/2015  . Coronary artery dissection   . Pure hypercholesterolemia   . Lung nodule seen on imaging study 09/24/2014  . Paroxysmal atrial fibrillation ([place]) 09/11/2014  . Asthmatic bronchitis with exacerbation 05/25/2014  . Primary hyperparathyroidism ([place]) 04/15/2014  . Palpitations 03/20/2014  . Radicular low back pain 11/24/2013  . Chronic diastolic heart failure ([place]) 10/02/2013  . Shortness of breath 08/31/2013  . Venous stasis dermatitis of both lower extremities 08/19/2013  . Peripheral edema 06/12/2013  . Other malaise and fatigue 06/12/2013  . Nocturia 06/12/2013  . Cellulitis of leg, right 10/11/2012  . Morbid obesity ([place]) 08/09/2012  . Urinary incontinence, urge 07/11/2012  . CAD (coronary artery disease) 10/31/2010  . NEOPLASM OF UNCERTAIN BEHAVIOR OF SKIN 02/11/2010  . Bipolar disorder ([place]) 09/14/2009  . ACUT MYOCARD INFARCT UNS SITE SUBSQT EPIS CARE 07/02/2008  . EDEMA 06/23/2008  . Hyperlipidemia 05/23/2008  . UNSPECIFIED DISORDER OF THYROID 02/04/2008  . Obesity 02/04/2008  . HYPERTENSION, BENIGN ESSENTIAL 02/04/2008  . MYOCARDIAL INFARCTION, HX OF 03/05/2007    Past Medical History:  Diagnosis Date  . Anemia   . Anxiety   . Asthma   . Atrial fibrillation ([place])   . Bipolar disorder ([place])   . Bronchitis    hx of   .  Cancer ([place])    basal cell carcinoma on right hand, papilloma left breast  . CHF (congestive heart failure) ([place])    pt seen at heart/vascular spec clinic 08/14/2014   . Coarse tremors    hands  . Coronary artery disease   . Depression   . DVT (deep vein thrombosis) in pregnancy    pt. reports that it was post knee surgery, not during pregnancy  . Dyslipidemia   . Dysrhythmia    A fib  . Fall   . Fatty liver   . Headache    migraines - stopped after menopause  . Heart attack ([place]) 02/2007   non-q-wave with second septal perforator  . Hyperlipidemia   . Hypertension   .  Hypothyroidism    history of took medication, has resolved  . Knee pain, left   . Lower extremity deep venous thrombosis (HCC)    20 years ago   . Measles    hx of in childhood   . Morbid obesity with BMI of 40.0-44.9, adult ([place])   . Pain at surgical incision    Left breast from procedure 09/11/16  . Pneumonia    hx of   . PONV (postoperative nausea and vomiting)    also slow to wake up  . Psychiatric hospitalization 05/2008  . Shingles    20 years ago   . Shortness of breath dyspnea    exercise   . Type 2 diabetes mellitus ([place]) 12/31/2015  . Urinary incontinence   . Urinary tract bacterial infections     Past Surgical History:  Procedure Laterality Date  . BASAL CELL CARCINOMA EXCISION    . BREAST LUMPECTOMY WITH RADIOACTIVE SEED LOCALIZATION Left 09/11/2016   Procedure: LEFT BREAST LUMPECTOMY WITH RADIOACTIVE SEED LOCALIZATION;  Surgeon: Excell Seltzer, MD;  Location: [facility];  Service: General;  Laterality: Left;  . BREAST MASS EXCISION     age 21, benign tumor  . CARDIAC CATHETERIZATION    . CARDIAC CATHETERIZATION N/A 12/30/2015   Procedure: Left Heart Cath and Coronary Angiography;  Surgeon: Belva Crome, MD;  Location: [facility];  Service: Cardiovascular;  Laterality: N/A;  . COLONOSCOPY    . DILATATION & CURETTAGE/HYSTEROSCOPY WITH MYOSURE N/A 09/22/2016   Procedure: DILATATION & CURETTAGE/HYSTEROSCOPY WITH MYOSURE;  Surgeon: Molli Posey, MD;  Location: [name];  Service: Gynecology;  Laterality: N/A;  . DILATION AND CURETTAGE OF UTERUS    . KNEE SURGERY     right, removed cartilage  . PARATHYROIDECTOMY N/A 08/25/2014   Procedure: PARATHYROIDECTOMY;  Surgeon: Armandina Gemma, MD;  Location[facility];  Service: General;  Laterality: N/A;  . SHOULDER ARTHROSCOPY WITH ROTATOR CUFF REPAIR AND SUBACROMIAL DECOMPRESSION Right 02/27/2017   Procedure: Right shoulder arthroscopic rotator cuff repair with biceps tenodesis and subacromial decompression;  Surgeon: Nicholes Stairs, MD;  Location: [facility];  Service: Orthopedics;  Laterality: Right;  150 mins  . TONSILLECTOMY    . TUBAL LIGATION      Social History   Tobacco Use  . Smoking status: Never Smoker  . Smokeless tobacco: Never Used  Substance Use Topics  . Alcohol use: Yes    Alcohol/week: 0.0 standard drinks    Comment: occ.  . Drug use: No    Family History  Problem Relation Age of Onset  . Breast cancer Mother 83  . Alcohol abuse Mother   . Alzheimer's disease Mother   . Alcohol abuse Father   . Alzheimer's disease Father   .  Parkinson's disease Father   . Alcohol abuse Maternal Grandfather   . Drug abuse Maternal Grandfather   . Alcohol abuse Maternal Grandmother   . Alcohol abuse Paternal Grandfather   . Alcohol abuse Paternal Grandmother   . Schizophrenia Cousin   . Diabetes Brother   . Hypertension Brother     Allergies  Allergen Reactions  . Eggs Or Egg-Derived Products Hives and Other (See Comments)    Can eat foods with cooked eggs; CANNOT handle when part of flu vaccine, etc.   . Lamictal [Lamotrigine] Hives and Rash    Medication list has been reviewed and updated.  Current Outpatient Medications on File Prior to Visit  Medication Sig Dispense Refill  . albuterol (PROVENTIL HFA;VENTOLIN HFA) 108 (90 BASE) MCG/ACT inhaler Inhale 2 puffs into the lungs every 6 (six) hours as needed for wheezing. 1 Inhaler 1  . allopurinol (ZYLOPRIM) 100 MG tablet TAKE 200 MG BY MOUTH EVERY DAY 60 tablet 6  . benztropine (COGENTIN) 1 MG tablet Take 1 mg by mouth 2 (two) times daily.  2  . divalproex (DEPAKOTE ER) 500 MG 24 hr tablet Take 1,500 mg by mouth at bedtime.     . empagliflozin (JARDIANCE) 10 MG TABS tablet Take 10 mg by mouth daily. 30 tablet 6  . furosemide (LASIX) 80 MG tablet TAKE 1 TAB DAILY. MAY ALSO TAKE 1/2 TAB AS NEEDED FOR FLUID. PLEASE CALL [phone] FOR FOLLOW UP. 45 tablet 0  . LATUDA 60 MG TABS Take 60 mg by mouth at bedtime.     [name] lisinopril  (PRINIVIL,ZESTRIL) 20 MG tablet TAKE 1 TABLET BY MOUTH EVERY DAY 90 tablet 3  . metFORMIN (GLUCOPHAGE) 500 MG tablet TAKE 1 TABLET (500 MG TOTAL) BY MOUTH 2 (TWO) TIMES DAILY WITH A MEAL. 180 tablet 1  . metoprolol tartrate (LOPRESSOR) 25 MG tablet Take 1 tablet (25 mg total) by mouth 2 (two) times daily. 180 tablet 3  . nitroGLYCERIN (NITROSTAT) 0.4 MG SL tablet Place 1 tablet (0.4 mg total) under the tongue every 5 (five) minutes as needed for chest pain. For chest pain 25 tablet 3  . rivaroxaban (XARELTO) 20 MG TABS tablet Take 20 mg by mouth at bedtime 30 tablet 5  . rosuvastatin (CRESTOR) 20 MG tablet TAKE 20 MG BY MOUTH EVERY DAY 30 tablet 5  . triamcinolone ointment (KENALOG) 0.1 % APPLY TO AFFECTED AREA TWICE A DAY FOR 14 DAYS (Patient taking differently: Apply 1 application topically 2 (two) times daily as needed (for dry skin). ) 30 g 1  . UNABLE TO FIND Med Name: One Touch Verio blood glucose meter Lot #[id number] x Exp 01/22/2018    . doxycycline (VIBRAMYCIN) 100 MG capsule Take 1 capsule (100 mg total) by mouth 2 (two) times daily. 20 capsule 0   No current facility-administered medications on file prior to visit.     Review of Systems:  As per HPI- otherwise negative.   Physical Examination: Vitals:   12/13/17 1019  BP: 126/60  Pulse: 83  Resp: 18  SpO2: 97%   Vitals:   12/13/17 1019  Weight: (!) 323 lb (146.5 kg)  Height: 5\' 11"  (1.803 m)   Body mass index is 45.05 kg/m. Ideal Body Weight: Weight in (lb) to have BMI = 25: 178.9  GEN: WDWN, NAD, Non-toxic, A & O x 3, obese/ tall build. otherwise looks well  HEENT: Atraumatic, Normocephalic. Neck supple. No masses, No LAD. Ears and Nose: No external deformity. CV: RRR, No M/G/R. No JVD.  No thrill. No extra heart sounds. PULM: CTA B, no wheezes, crackles, rhonchi. No retractions. No resp. distress. No accessory muscle use. EXTR: No c/c/trace bilateral LE edema  NEURO Normal gait.  PSYCH: Normally interactive.  Conversant. Not depressed or anxious appearing.  Calm demeanor.  Foot exam: normal DP pulse and monofilament sensation.  Her feet are poorly formed vs/ breakdown with low arches and bunions bilaterally Feet are warm and well perfused  She has stigmata of chronic venous stasis with skin hyperpigmentation on the bilateral medial ankles.  This is longstanding   Assessment and Plan: Neuropathy involving both lower extremities - Plan: CBC, Comprehensive metabolic panel, Ferritin, B12, Folate  Pain in both feet - Plan: VAS [place] ABI WITH/WO TBI  Type 2 diabetes mellitus without complication, unspecified whether long term insulin use ([place]) - Plan: Comprehensive metabolic panel  Bilateral foot pain for about one month Likely due to neuropathy but also consider a circulation issue Set up for ABI and labs as above Follow-up with pt following results For the time being she does not wish to start on gabapentin  Signed Lamar Blinks, MD  Results for orders placed or performed in visit on 12/13/17  CBC  Result Value Ref Range   WBC 6.3 4.0 - 10.5 K/uL   RBC 5.47 (H) 3.87 - 5.11 Mil/uL   Platelets 147.0 (L) 150.0 - 400.0 K/uL   Hemoglobin 16.1 (H) 12.0 - 15.0 g/dL   HCT 48.3 (H) 36.0 - 46.0 %   MCV 88.3 78.0 - 100.0 fl   MCHC 33.4 30.0 - 36.0 g/dL   RDW 14.7 11.5 - 15.5 %  Comprehensive metabolic panel  Result Value Ref Range   Sodium 143 135 - 145 mEq/L   Potassium 4.2 3.5 - 5.1 mEq/L   Chloride 104 96 - 112 mEq/L   CO2 29 19 - 32 mEq/L   Glucose, Bld 124 (H) 70 - 99 mg/dL   BUN 22 6 - 23 mg/dL   Creatinine, Ser 0.94 0.40 - 1.20 mg/dL   Total Bilirubin 0.5 0.2 - 1.2 mg/dL   Alkaline Phosphatase 99 39 - 117 U/L   AST 15 0 - 37 U/L   ALT 20 0 - 35 U/L   Total Protein 6.5 6.0 - 8.3 g/dL   Albumin 4.3 3.5 - 5.2 g/dL   Calcium 9.2 8.4 - 10.5 mg/dL   GFR 64.09 >60.00 mL/min  Ferritin  Result Value Ref Range   Ferritin 68.3 10.0 - 291.0 ng/mL  B12  Result Value Ref Range   Vitamin  B-12 812 211 - 911 pg/mL  Folate  Result Value Ref Range   Folate 12.6 >5.9 ng/mL   Received labs, message to pt Await ABI reports   Your labs are all normal except for high red cell counts- this is like the opposite of anemia.   Going back it looks like your red cell counts have been slightly high periodically but never anything serious.  Do you recall anyone looking into this for you in the past?  Iron, ferritin and folate are all ok  The thing we think about with high red counts is the blood becoming a bit "thick" and increasing risk of clots.  However as you are on xarelto a clot is much less likely. We should plan to repeat your blood counts in a month or so. I will order this test for you to do as a lab visit only.  Unfortunately taking xarelto means you cannot donate blood at  the red cross to get rid of "extra!" I will watch out for your circulation test

## 2017-12-16 ENCOUNTER — Encounter: Payer: Self-pay | Admitting: Family Medicine

## 2017-12-19 ENCOUNTER — Ambulatory Visit (HOSPITAL_COMMUNITY)
Admission: RE | Admit: 2017-12-19 | Discharge: 2017-12-19 | Disposition: A | Discharge status: Home / Self Care | Payer: BC Managed Care – PPO | Source: Ambulatory Visit | Attending: Cardiology | Admitting: Cardiology

## 2017-12-19 ENCOUNTER — Encounter: Payer: Self-pay | Admitting: Family Medicine

## 2017-12-19 DIAGNOSIS — M79672 Pain in left foot: Secondary | ICD-10-CM

## 2017-12-19 DIAGNOSIS — M79671 Pain in right foot: Secondary | ICD-10-CM | POA: Insufficient documentation

## 2017-12-20 ENCOUNTER — Encounter: Payer: Self-pay | Admitting: Family Medicine

## 2017-12-24 ENCOUNTER — Other Ambulatory Visit (HOSPITAL_COMMUNITY): Payer: Self-pay | Admitting: Internal Medicine

## 2017-12-25 ENCOUNTER — Telehealth: Payer: Self-pay

## 2017-12-25 NOTE — Telephone Encounter (Signed)
Copied from [place] [phone]. Topic: General - Other >> Dec 25, 2017  2:55 PM Oneta Rack wrote: Patient states she received a VM stating her 03/11/17 with Dr. Lorelei Pont appointment had to be [facility], please advise I don't see any notes regarding why , please advise patient best # [phone]

## 2017-12-26 ENCOUNTER — Telehealth: Payer: Self-pay | Admitting: *Deleted

## 2017-12-26 NOTE — Telephone Encounter (Signed)
Received request for Medical Records from [facility]; forwarded to Medical Records via email/scan/SLS 12/04

## 2018-01-01 NOTE — Telephone Encounter (Signed)
I am there on the 17th- not sure why she was told this.  Called pt and let her know that we are good for her appt

## 2018-01-07 ENCOUNTER — Ambulatory Visit: Payer: BC Managed Care – PPO | Admitting: Family Medicine

## 2018-01-07 ENCOUNTER — Encounter: Payer: Self-pay | Admitting: Family Medicine

## 2018-01-07 VITALS — BP 130/88 | HR 51 | Temp 98.2°F | Ht 71.0 in | Wt 325.0 lb

## 2018-01-07 DIAGNOSIS — N3001 Acute cystitis with hematuria: Secondary | ICD-10-CM | POA: Diagnosis not present

## 2018-01-07 LAB — POC URINALSYSI DIPSTICK (AUTOMATED)
BILIRUBIN UA: NEGATIVE
Blood, UA: NEGATIVE
Glucose, UA: POSITIVE — AB
KETONES UA: NEGATIVE
Leukocytes, UA: NEGATIVE
Nitrite, UA: NEGATIVE
PH UA: 6 (ref 5.0–8.0)
PROTEIN UA: NEGATIVE
SPEC GRAV UA: 1.015 (ref 1.010–1.025)
Urobilinogen, UA: 0.2 E.U./dL

## 2018-01-07 MED ORDER — SULFAMETHOXAZOLE-TRIMETHOPRIM 800-160 MG PO TABS: 1.0000 | ORAL_TABLET | Freq: Two times a day (BID) | ORAL | 0 refills | Status: AC

## 2018-01-07 NOTE — Progress Notes (Signed)
Chief Complaint  Patient presents with  . Hematuria  . Dysuria    Kathleen [name] is a 62 y.o. female here for possible UTI.  Duration: 2 days. Symptoms: urinary frequency, incomplete empyting, hematuria, bilateral flank pain, and dysuria Denies: fever, nausea, vomiting, vaginal discharge Hx of recurrent UTI? No  ROS:  Constitutional: denies fever GU: As noted in HPI  Past Medical History:  Diagnosis Date  . Anemia   . Anxiety   . Asthma   . Atrial fibrillation ([place])   . Bipolar disorder ([place])   . Cancer ([place])    basal cell carcinoma on right hand, papilloma left breast  . CHF (congestive heart failure) ([place])    pt seen at heart/vascular spec clinic 08/14/2014   . Coarse tremors    hands  . Coronary artery disease   . Depression   . DVT (deep vein thrombosis) in pregnancy    pt. reports that it was post knee surgery, not during pregnancy  . Dyslipidemia   . Fall   . Fatty liver   . Headache    migraines - stopped after menopause  . Heart attack ([place]) 02/2007   non-q-wave with second septal perforator  . Hypertension   . Hypothyroidism    history of took medication, has resolved  . Knee pain, left   . Lower extremity deep venous thrombosis (HCC)    20 years ago   . Measles    hx of in childhood   . Morbid obesity with BMI of 40.0-44.9, adult ([place])   . Pain at surgical incision    Left breast from procedure 09/11/16  . Pneumonia    hx of   . PONV (postoperative nausea and vomiting)    also slow to wake up  . Psychiatric hospitalization 05/2008  . Shingles    20 years ago   . Shortness of breath dyspnea    exercise   . Type 2 diabetes mellitus ([place]) 12/31/2015  . Urinary incontinence     BP 130/88 (BP Location: Left Arm, Patient Position: Sitting, Cuff Size: Large)   Pulse (!) 51   Temp 98.2 F (36.8 C) (Oral)   Ht 5\' 11"  (1.803 m)   Wt (!) 325 lb (147.4 kg)   SpO2 97%   BMI 45.33 kg/m  General: Awake, alert, appears stated age Heart: RRR Lungs: CTAB,  normal respiratory effort, no accessory muscle usage Abd: BS+, soft, NT, ND, no masses or organomegaly MSK: No CVA tenderness, neg Lloyd's sign Psych: Age appropriate judgment and insight  Acute cystitis with hematuria - Plan: sulfamethoxazole-trimethoprim (BACTRIM DS,SEPTRA DS) 800-160 MG tablet, POCT Urinalysis Dipstick (Automated), Urine Culture  Given s/s's, will tentatively tx for 7 days pending results of cx. UA neg other than glucose w known hx of DM.  Stay hydrated. Seek immediate care if pt starts to develop fevers, new/worsening symptoms, uncontrollable N/V. F/u prn. The patient voiced understanding and agreement to the plan.  [place], DO 01/07/18 3:15 PM

## 2018-01-07 NOTE — Patient Instructions (Signed)
Stay hydrated.   Warning signs/symptoms: Uncontrollable nausea/vomiting, fevers, worsening symptoms despite treatment, confusion.  Give us around 2 business days to get culture back to you.  Let us know if you need anything. 

## 2018-01-07 NOTE — Progress Notes (Signed)
Pre visit review using our clinic review tool, if applicable. No additional management support is needed unless otherwise documented below in the visit note. 

## 2018-01-08 LAB — URINE CULTURE
MICRO NUMBER:: 91501998
SPECIMEN QUALITY:: ADEQUATE

## 2018-01-09 ENCOUNTER — Encounter: Payer: Self-pay | Admitting: Family Medicine

## 2018-01-09 DIAGNOSIS — R310 Gross hematuria: Secondary | ICD-10-CM

## 2018-01-10 ENCOUNTER — Encounter: Payer: Self-pay | Admitting: Family Medicine

## 2018-01-14 ENCOUNTER — Other Ambulatory Visit: Payer: Self-pay | Admitting: Family Medicine

## 2018-03-01 ENCOUNTER — Other Ambulatory Visit: Payer: Self-pay

## 2018-03-01 ENCOUNTER — Encounter (HOSPITAL_BASED_OUTPATIENT_CLINIC_OR_DEPARTMENT_OTHER): Payer: Self-pay | Admitting: Emergency Medicine

## 2018-03-01 ENCOUNTER — Emergency Department (HOSPITAL_BASED_OUTPATIENT_CLINIC_OR_DEPARTMENT_OTHER)
Admission: EM | Admit: 2018-03-01 | Discharge: 2018-03-01 | Disposition: A | Discharge status: Home / Self Care | Payer: BC Managed Care – PPO | Attending: Emergency Medicine | Admitting: Emergency Medicine

## 2018-03-01 ENCOUNTER — Emergency Department (HOSPITAL_BASED_OUTPATIENT_CLINIC_OR_DEPARTMENT_OTHER): Payer: BC Managed Care – PPO

## 2018-03-01 DIAGNOSIS — I2510 Atherosclerotic heart disease of native coronary artery without angina pectoris: Secondary | ICD-10-CM | POA: Diagnosis not present

## 2018-03-01 DIAGNOSIS — I110 Hypertensive heart disease with heart failure: Secondary | ICD-10-CM | POA: Insufficient documentation

## 2018-03-01 DIAGNOSIS — E119 Type 2 diabetes mellitus without complications: Secondary | ICD-10-CM | POA: Diagnosis not present

## 2018-03-01 DIAGNOSIS — I5032 Chronic diastolic (congestive) heart failure: Secondary | ICD-10-CM | POA: Insufficient documentation

## 2018-03-01 DIAGNOSIS — Y939 Activity, unspecified: Secondary | ICD-10-CM | POA: Diagnosis not present

## 2018-03-01 DIAGNOSIS — Z79899 Other long term (current) drug therapy: Secondary | ICD-10-CM | POA: Diagnosis not present

## 2018-03-01 DIAGNOSIS — E039 Hypothyroidism, unspecified: Secondary | ICD-10-CM | POA: Diagnosis not present

## 2018-03-01 DIAGNOSIS — Z7984 Long term (current) use of oral hypoglycemic drugs: Secondary | ICD-10-CM | POA: Insufficient documentation

## 2018-03-01 DIAGNOSIS — J45909 Unspecified asthma, uncomplicated: Secondary | ICD-10-CM | POA: Insufficient documentation

## 2018-03-01 DIAGNOSIS — W1789XA Other fall from one level to another, initial encounter: Secondary | ICD-10-CM | POA: Diagnosis not present

## 2018-03-01 DIAGNOSIS — Y999 Unspecified external cause status: Secondary | ICD-10-CM | POA: Insufficient documentation

## 2018-03-01 DIAGNOSIS — M25561 Pain in right knee: Secondary | ICD-10-CM | POA: Diagnosis not present

## 2018-03-01 DIAGNOSIS — M25562 Pain in left knee: Secondary | ICD-10-CM

## 2018-03-01 DIAGNOSIS — I252 Old myocardial infarction: Secondary | ICD-10-CM | POA: Insufficient documentation

## 2018-03-01 DIAGNOSIS — Y929 Unspecified place or not applicable: Secondary | ICD-10-CM | POA: Diagnosis not present

## 2018-03-01 MED ORDER — DICLOFENAC SODIUM 1 % TD GEL: 4.0000 g | Freq: Four times a day (QID) | TRANSDERMAL | 0 refills | Status: DC

## 2018-03-01 NOTE — ED Notes (Signed)
Patient transported to X-ray 

## 2018-03-01 NOTE — ED Triage Notes (Signed)
Pt BIB [facility] for a mechanical fall that happened yesterday. Pt  States she was getting out of her car when her foot got caught on the brake and she fell out of the car striking her knee. Pt was able to stand on it getting off EMS stretcher. She does take Eliquis for Afib but denies loc or hitting her head.

## 2018-03-01 NOTE — ED Provider Notes (Signed)
[facility] Provider Note   CSN: [id number] Arrival date & time: 03/01/18  2031     History   Chief Complaint Chief Complaint  Patient presents with  . Knee Injury    HPI Kathleen [name] is a 63 y.o. female.  63 yo F with a chief complaint right knee pain.  The patient was trying to get out of her car and her foot got stuck underneath break and she fell to the ground.  She thinks that her knee was twisted.  Having some pain is worsening with bearing weight.  Described as sharp and stabbing and feels that it somewhat unstable.  She denies other injury from the fall.  Has not tried anything for this at home.  The history is provided by the patient.  Injury  This is a new problem. The current episode started yesterday. The problem occurs constantly. The problem has been gradually worsening. Pertinent negatives include no chest pain, no abdominal pain, no headaches and no shortness of breath. The symptoms are aggravated by bending and twisting. Nothing relieves the symptoms. She has tried nothing for the symptoms. The treatment provided no relief.    Past Medical History:  Diagnosis Date  . Anemia   . Anxiety   . Asthma   . Atrial fibrillation ([place])   . Bipolar disorder ([place])   . Cancer ([place])    basal cell carcinoma on right hand, papilloma left breast  . CHF (congestive heart failure) ([place])    pt seen at heart/vascular spec clinic 08/14/2014   . Coarse tremors    hands  . Coronary artery disease   . Depression   . DVT (deep vein thrombosis) in pregnancy    pt. reports that it was post knee surgery, not during pregnancy  . Dyslipidemia   . Fall   . Fatty liver   . Headache    migraines - stopped after menopause  . Heart attack ([place]) 02/2007   non-q-wave with second septal perforator  . Hypertension   . Hypothyroidism    history of took medication, has resolved  . Knee pain, left   . Lower extremity deep venous thrombosis (HCC)    20 years  ago   . Measles    hx of in childhood   . Morbid obesity with BMI of 40.0-44.9, adult ([place])   . Pain at surgical incision    Left breast from procedure 09/11/16  . Pneumonia    hx of   . PONV (postoperative nausea and vomiting)    also slow to wake up  . Psychiatric hospitalization 05/2008  . Shingles    20 years ago   . Shortness of breath dyspnea    exercise   . Type 2 diabetes mellitus ([place]) 12/31/2015  . Urinary incontinence     Patient Active Problem List   Diagnosis Date Noted  . Complete rotator cuff tear or rupture of right shoulder, not specified as traumatic 02/27/2017  . Auditory hallucination 12/13/2016  . Tremor of both hands 11/20/2016  . Gout 06/29/2016  . Type 2 diabetes mellitus ([place]) 12/31/2015  . Chest pain with moderate risk for cardiac etiology 12/29/2015  . Coronary artery dissection   . Pure hypercholesterolemia   . Lung nodule seen on imaging study 09/24/2014  . Paroxysmal atrial fibrillation ([place]) 09/11/2014  . Asthmatic bronchitis with exacerbation 05/25/2014  . Primary hyperparathyroidism ([place]) 04/15/2014  . Palpitations 03/20/2014  . Radicular low back pain 11/24/2013  . Chronic diastolic  heart failure ([place]) 10/02/2013  . Shortness of breath 08/31/2013  . Venous stasis dermatitis of both lower extremities 08/19/2013  . Peripheral edema 06/12/2013  . Other malaise and fatigue 06/12/2013  . Nocturia 06/12/2013  . Cellulitis of leg, right 10/11/2012  . Morbid obesity ([place]) 08/09/2012  . Urinary incontinence, urge 07/11/2012  . CAD (coronary artery disease) 10/31/2010  . NEOPLASM OF UNCERTAIN BEHAVIOR OF SKIN 02/11/2010  . Bipolar disorder ([place]) 09/14/2009  . ACUT MYOCARD INFARCT UNS SITE SUBSQT EPIS CARE 07/02/2008  . EDEMA 06/23/2008  . Hyperlipidemia 05/23/2008  . UNSPECIFIED DISORDER OF THYROID 02/04/2008  . Obesity 02/04/2008  . HYPERTENSION, BENIGN ESSENTIAL 02/04/2008  . MYOCARDIAL INFARCTION, HX OF 03/05/2007    Past Surgical  History:  Procedure Laterality Date  . BASAL CELL CARCINOMA EXCISION    . BREAST LUMPECTOMY WITH RADIOACTIVE SEED LOCALIZATION Left 09/11/2016   Procedure: LEFT BREAST LUMPECTOMY WITH RADIOACTIVE SEED LOCALIZATION;  Surgeon: Excell Seltzer, MD;  Location: [facility];  Service: General;  Laterality: Left;  . BREAST MASS EXCISION     age 70, benign tumor  . CARDIAC CATHETERIZATION    . CARDIAC CATHETERIZATION N/A 12/30/2015   Procedure: Left Heart Cath and Coronary Angiography;  Surgeon: Belva Crome, MD;  Location: [facility];  Service: Cardiovascular;  Laterality: N/A;  . COLONOSCOPY    . DILATATION & CURETTAGE/HYSTEROSCOPY WITH MYOSURE N/A 09/22/2016   Procedure: DILATATION & CURETTAGE/HYSTEROSCOPY WITH MYOSURE;  Surgeon: Molli Posey, MD;  Location: [facility];  Service: Gynecology;  Laterality: N/A;  . DILATION AND CURETTAGE OF UTERUS    . KNEE SURGERY     right, removed cartilage  . PARATHYROIDECTOMY N/A 08/25/2014   Procedure: PARATHYROIDECTOMY;  Surgeon: Armandina Gemma, MD;  Location: [facility];  Service: General;  Laterality: N/A;  . SHOULDER ARTHROSCOPY WITH ROTATOR CUFF REPAIR AND SUBACROMIAL DECOMPRESSION Right 02/27/2017   Procedure: Right shoulder arthroscopic rotator cuff repair with biceps tenodesis and subacromial decompression;  Surgeon: Nicholes Stairs, MD;  Location: [facility];  Service: Orthopedics;  Laterality: Right;  150 mins  . TONSILLECTOMY    . TUBAL LIGATION       OB History   No obstetric history on file.      Home Medications    Prior to Admission medications   Medication Sig Start Date End Date Taking? Authorizing Provider  albuterol (PROVENTIL HFA;VENTOLIN HFA) 108 (90 BASE) MCG/ACT inhaler Inhale 2 puffs into the lungs every 6 (six) hours as needed for wheezing. 11/12/12   Roma Schanz R, DO  allopurinol (ZYLOPRIM) 100 MG tablet TAKE 200 MG BY MOUTH EVERY DAY 07/19/17   Midge Minium, MD  benztropine (COGENTIN) 1 MG tablet Take 1 mg by mouth  2 (two) times daily. 01/14/17   [provider]  diclofenac sodium (VOLTAREN) 1 % GEL Apply 4 g topically 4 (four) times daily. 03/01/18   Deno Etienne, DO  divalproex (DEPAKOTE ER) 500 MG 24 hr tablet Take 1,500 mg by mouth at bedtime.     [provider]  doxycycline (VIBRAMYCIN) 100 MG capsule Take 1 capsule (100 mg total) by mouth 2 (two) times daily. 10/29/17   [name], [name], MD  furosemide (LASIX) 80 MG tablet TAKE 1 TAB DAILY. MAY ALSO TAKE 1/2 TAB AS NEEDED FOR FLUID. PLEASE CALL [phone] FOR FOLLOW UP. 03/22/17   Bensimhon, Shaune Pascal, MD  JARDIANCE 10 MG TABS tablet TAKE ONE TABLET BY MOUTH DAILY 12/25/17   Bensimhon, Shaune Pascal, MD  LATUDA 60  MG TABS Take 60 mg by mouth at bedtime.  03/08/16   [provider]  lisinopril (PRINIVIL,ZESTRIL) 20 MG tablet TAKE 1 TABLET BY MOUTH EVERY DAY 04/03/17   [name], [name], MD  metFORMIN (GLUCOPHAGE) 500 MG tablet TAKE 1 TABLET (500 MG TOTAL) BY MOUTH 2 (TWO) TIMES DAILY WITH A MEAL. 07/30/17   [name], [name], MD  metoprolol tartrate (LOPRESSOR) 25 MG tablet Take 1 tablet (25 mg total) by mouth 2 (two) times daily. 07/23/17   Bensimhon, Shaune Pascal, MD  nitroGLYCERIN (NITROSTAT) 0.4 MG SL tablet Place 1 tablet (0.4 mg total) under the tongue every 5 (five) minutes as needed for chest pain. For chest pain 12/07/11   Barrett, Evelene Croon, PA-C  rivaroxaban (XARELTO) 20 MG TABS tablet Take 20 mg by mouth at bedtime 10/29/17   Bensimhon, Shaune Pascal, MD  rosuvastatin (CRESTOR) 20 MG tablet TAKE ONE TABLET BY MOUTH DAILY 01/14/18   [name], [name], MD  triamcinolone ointment (KENALOG) 0.1 % APPLY TO AFFECTED AREA TWICE A DAY FOR 14 DAYS Patient taking differently: Apply 1 application topically 2 (two) times daily as needed (for dry skin).  06/29/16   Midge Minium, MD  UNABLE TO FIND Med Name: One Touch Verio blood glucose meter Lot #[id number] x Exp 01/22/2018    [provider]    Family History Family History  Problem  Relation Age of Onset  . Breast cancer Mother 62  . Alcohol abuse Mother   . Alzheimer's disease Mother   . Alcohol abuse Father   . Alzheimer's disease Father   . Parkinson's disease Father   . Alcohol abuse Maternal Grandfather   . Drug abuse Maternal Grandfather   . Alcohol abuse Maternal Grandmother   . Alcohol abuse Paternal Grandfather   . Alcohol abuse Paternal Grandmother   . Schizophrenia Cousin   . Diabetes Brother   . Hypertension Brother     Social History Social History   Tobacco Use  . Smoking status: Never Smoker  . Smokeless tobacco: Never Used  Substance Use Topics  . Alcohol use: Yes    Alcohol/week: 0.0 standard drinks    Comment: occ.  . Drug use: No     Allergies   Eggs or egg-derived products and Lamictal [lamotrigine]   Review of Systems Review of Systems  Constitutional: Negative for chills and fever.  HENT: Negative for congestion and rhinorrhea.   Eyes: Negative for redness and visual disturbance.  Respiratory: Negative for shortness of breath and wheezing.   Cardiovascular: Negative for chest pain and palpitations.  Gastrointestinal: Negative for abdominal pain, nausea and vomiting.  Genitourinary: Negative for dysuria and urgency.  Musculoskeletal: Positive for arthralgias. Negative for myalgias.  Skin: Negative for pallor and wound.  Neurological: Negative for dizziness and headaches.     Physical Exam Updated Vital Signs BP (!) 154/72   Pulse 86   Temp 98 F (36.7 C)   Ht 6' (1.829 m)   Wt (!) 145.6 kg   SpO2 96%   BMI 43.54 kg/m   Physical Exam Vitals signs and nursing note reviewed.  Constitutional:      General: She is not in acute distress.    Appearance: She is well-developed. She is obese. She is not diaphoretic.  HENT:     Head: Normocephalic and atraumatic.  Eyes:     Pupils: Pupils are equal, round, and reactive to light.  Neck:     Musculoskeletal: Normal range of motion and neck supple.  Cardiovascular:  Rate and Rhythm: Normal rate and regular rhythm.     Heart sounds: No murmur. No friction rub. No gallop.   Pulmonary:     Effort: Pulmonary effort is normal.     Breath sounds: No wheezing or rales.  Abdominal:     General: There is no distension.     Palpations: Abdomen is soft.     Tenderness: There is no abdominal tenderness.  Musculoskeletal:        General: Tenderness present.     Comments: No appreciable edema to the right knee.  Full passive range of motion.  Pulse motor and sensation is intact distally.  No appreciable ligamentous laxity.  Skin:    General: Skin is warm and dry.  Neurological:     Mental Status: She is alert and oriented to person, place, and time.  Psychiatric:        Behavior: Behavior normal.      ED Treatments / Results  Labs (all labs ordered are listed, but only abnormal results are displayed) Labs Reviewed - No data to display  EKG None  Radiology Dg Knee Complete 4 Views Right  Result Date: 03/01/2018 CLINICAL DATA:  Fall from car yesterday, twisted knee. Remote history of knee surgery. EXAM: RIGHT KNEE - COMPLETE 4+ VIEW COMPARISON:  RIGHT knee radiographs September 15, 2014 FINDINGS: No fracture deformity or dislocation. Moderate to severe lateral and moderate bicompartmental joint space narrowing with similar predominately medial and lateral compartment marginal spurring. No destructive bony lesions. IMPRESSION: 1. No acute fracture deformity or dislocation. 2. Progressed tricompartmental osteoarthrosis, severe within lateral compartment. Electronically Signed   By: Elon Alas M.D.   On: 03/01/2018 21:06    Procedures Procedures (including critical care time)  Medications Ordered in ED Medications - No data to display   Initial Impression / Assessment and Plan / ED Course  I have reviewed the triage vital signs and the nursing notes.  Pertinent labs & imaging results that were available during my care of the patient were  reviewed by me and considered in my medical decision making (see chart for details).     63 yo F with a chief complaint of right knee pain.  Had a twisting mechanism.  Plain film viewed by me negative for fracture.  Will place in an Ace wrap.  Give crutches.  PCP follow-up.  9:22 PM:  I have discussed the diagnosis/risks/treatment options with the patient and believe the pt to be eligible for discharge home to follow-up with PCP. We also discussed returning to the ED immediately if new or worsening sx occur. We discussed the sx which are most concerning (e.g., sudden worsening pain, fever, inability to tolerate by mouth) that necessitate immediate return. Medications administered to the patient during their visit and any new prescriptions provided to the patient are listed below.  Medications given during this visit Medications - No data to display   The patient appears reasonably screen and/or stabilized for discharge and I doubt any other medical condition or other [facility] requiring further screening, evaluation, or treatment in the ED at this time prior to discharge.    Final Clinical Impressions(s) / ED Diagnoses   Final diagnoses:  Acute pain of left knee    ED Discharge Orders         Ordered    diclofenac sodium (VOLTAREN) 1 % GEL  4 times daily     03/01/18 2119           Deno Etienne, DO  03/01/18 2122  

## 2018-03-01 NOTE — Discharge Instructions (Signed)
Take tylenol 1000mg(2 extra strength) four times a day.  ° °

## 2018-03-03 NOTE — Progress Notes (Signed)
[facility] [address], [place], [place] [address] [phone]  Date:  03/04/2018   Name:  Kathleen [name]   DOB:  1955-07-28   MRN:  [id number]  PCP:  Darreld Mclean, MD    Chief Complaint: Knee Pain (fall, friday, right knee feels like its grinding, worse when walking)   History of Present Illness:  Kathleen [name] is a 63 y.o. very pleasant female patient who presents with the following:  History of hypertension, CAD, CHF, A. fib, diabetes She is a patient of the advanced heart failure clinic, last seen in August She was noted to have chronic diastolic heart failure, most recent Holter monitor did not show any atrial fib She was in ER on February 7 with a knee injury-she was getting out of a car, got her foot stuck and fell, twisting her knee. She had plain films the ER which did not show any fracture but which did show significant arthritis Discharged home with an Ace wrap and Voltaren gel prescription, crutches She also has a walker at home  Dg Knee Complete 4 Views Right  Result Date: 03/01/2018 CLINICAL DATA:  Fall from car yesterday, twisted knee. Remote history of knee surgery. EXAM: RIGHT KNEE - COMPLETE 4+ VIEW COMPARISON:  RIGHT knee radiographs September 15, 2014 FINDINGS: No fracture deformity or dislocation. Moderate to severe lateral and moderate bicompartmental joint space narrowing with similar predominately medial and lateral compartment marginal spurring. No destructive bony lesions. IMPRESSION: 1. No acute fracture deformity or dislocation. 2. Progressed tricompartmental osteoarthrosis, severe within lateral compartment. Electronically Signed   By: Elon Alas M.D.   On: 03/01/2018 21:06   Lab Results  Component Value Date   HGBA1C 5.9 10/29/2017   She is due for hepatitis C screening and Pap- physical is scheduled for next week Most recent A1c showed good control her diabetes Question  Shingrix  Her knee is still painful when she is trying to walk Ok at rest Bending, getting from seated to standing is painful It feels unstable She is using crutches  She did have right knee surgery in the 1990s- meniscal repair She is now a pt of [facility] and would like to see them for this issue as well   She did not otherwise get hurt in her fall and is otherwise feeling ok    Patient Active Problem List   Diagnosis Date Noted  . Complete rotator cuff tear or rupture of right shoulder, not specified as traumatic 02/27/2017  . Auditory hallucination 12/13/2016  . Tremor of both hands 11/20/2016  . Gout 06/29/2016  . Type 2 diabetes mellitus ([place]) 12/31/2015  . Chest pain with moderate risk for cardiac etiology 12/29/2015  . Coronary artery dissection   . Pure hypercholesterolemia   . Lung nodule seen on imaging study 09/24/2014  . Paroxysmal atrial fibrillation ([place]) 09/11/2014  . Asthmatic bronchitis with exacerbation 05/25/2014  . Primary hyperparathyroidism ([place]) 04/15/2014  . Palpitations 03/20/2014  . Radicular low back pain 11/24/2013  . Chronic diastolic heart failure ([place]) 10/02/2013  . Shortness of breath 08/31/2013  . Venous stasis dermatitis of both lower extremities 08/19/2013  . Peripheral edema 06/12/2013  . Other malaise and fatigue 06/12/2013  . Nocturia 06/12/2013  . Cellulitis of leg, right 10/11/2012  . Morbid obesity ([place]) 08/09/2012  . Urinary incontinence, urge 07/11/2012  . CAD (coronary artery disease) 10/31/2010  . NEOPLASM OF UNCERTAIN BEHAVIOR OF SKIN  02/11/2010  . Bipolar disorder ([place]) 09/14/2009  . ACUT MYOCARD INFARCT UNS SITE SUBSQT EPIS CARE 07/02/2008  . EDEMA 06/23/2008  . Hyperlipidemia 05/23/2008  . UNSPECIFIED DISORDER OF THYROID 02/04/2008  . Obesity 02/04/2008  . HYPERTENSION, BENIGN ESSENTIAL 02/04/2008  . MYOCARDIAL INFARCTION, HX OF 03/05/2007    Past Medical History:  Diagnosis Date  . Anemia   . Anxiety   . Asthma    . Atrial fibrillation ([place])   . Bipolar disorder ([place])   . Cancer ([place])    basal cell carcinoma on right hand, papilloma left breast  . CHF (congestive heart failure) ([place])    pt seen at heart/vascular spec clinic 08/14/2014   . Coarse tremors    hands  . Coronary artery disease   . Depression   . DVT (deep vein thrombosis) in pregnancy    pt. reports that it was post knee surgery, not during pregnancy  . Dyslipidemia   . Fall   . Fatty liver   . Headache    migraines - stopped after menopause  . Heart attack ([place]) 02/2007   non-q-wave with second septal perforator  . Hypertension   . Hypothyroidism    history of took medication, has resolved  . Knee pain, left   . Lower extremity deep venous thrombosis ([facility])    20 years ago   . Measles    hx of in childhood   . Morbid obesity with BMI of 40.0-44.9, adult ([place])   . Pain at surgical incision    Left breast from procedure 09/11/16  . Pneumonia    hx of   . PONV (postoperative nausea and vomiting)    also slow to wake up  . Psychiatric hospitalization 05/2008  . Shingles    20 years ago   . Shortness of breath dyspnea    exercise   . Type 2 diabetes mellitus ([place]) 12/31/2015  . Urinary incontinence     Past Surgical History:  Procedure Laterality Date  . BASAL CELL CARCINOMA EXCISION    . BREAST LUMPECTOMY WITH RADIOACTIVE SEED LOCALIZATION Left 09/11/2016   Procedure: LEFT BREAST LUMPECTOMY WITH RADIOACTIVE SEED LOCALIZATION;  Surgeon: Excell Seltzer, MD;  Location: [facility];  Service: General;  Laterality: Left;  . BREAST MASS EXCISION     age 4, benign tumor  . CARDIAC CATHETERIZATION    . CARDIAC CATHETERIZATION N/A 12/30/2015   Procedure: Left Heart Cath and Coronary Angiography;  Surgeon: Belva Crome, MD;  Location: [facility];  Service: Cardiovascular;  Laterality: N/A;  . COLONOSCOPY    . DILATATION & CURETTAGE/HYSTEROSCOPY WITH MYOSURE N/A 09/22/2016   Procedure: DILATATION & CURETTAGE/HYSTEROSCOPY  WITH MYOSURE;  Surgeon: Molli Posey, MD;  Location: [facility];  Service: Gynecology;  Laterality: N/A;  . DILATION AND CURETTAGE OF UTERUS    . KNEE SURGERY     right, removed cartilage  . PARATHYROIDECTOMY N/A 08/25/2014   Procedure: PARATHYROIDECTOMY;  Surgeon: Armandina Gemma, MD;  Location: [facility];  Service: General;  Laterality: N/A;  . SHOULDER ARTHROSCOPY WITH ROTATOR CUFF REPAIR AND SUBACROMIAL DECOMPRESSION Right 02/27/2017   Procedure: Right shoulder arthroscopic rotator cuff repair with biceps tenodesis and subacromial decompression;  Surgeon: Nicholes Stairs, MD;  Location: [facility];  Service: Orthopedics;  Laterality: Right;  150 mins  . TONSILLECTOMY    . TUBAL LIGATION      Social History   Tobacco Use  . Smoking status: Never Smoker  . Smokeless tobacco: Never Used  Substance Use  Topics  . Alcohol use: Yes    Alcohol/week: 0.0 standard drinks    Comment: occ.  . Drug use: No    Family History  Problem Relation Age of Onset  . Breast cancer Mother 70  . Alcohol abuse Mother   . Alzheimer's disease Mother   . Alcohol abuse Father   . Alzheimer's disease Father   . Parkinson's disease Father   . Alcohol abuse Maternal Grandfather   . Drug abuse Maternal Grandfather   . Alcohol abuse Maternal Grandmother   . Alcohol abuse Paternal Grandfather   . Alcohol abuse Paternal Grandmother   . Schizophrenia Cousin   . Diabetes Brother   . Hypertension Brother     Allergies  Allergen Reactions  . Eggs Or Egg-Derived Products Hives and Other (See Comments)    Can eat foods with cooked eggs; CANNOT handle when part of flu vaccine, etc.   . Lamictal [Lamotrigine] Hives and Rash    Medication list has been reviewed and updated.  Current Outpatient Medications on File Prior to Visit  Medication Sig Dispense Refill  . albuterol (PROVENTIL HFA;VENTOLIN HFA) 108 (90 BASE) MCG/ACT inhaler Inhale 2 puffs into the lungs every 6 (six) hours as needed for wheezing. 1 Inhaler 1   . allopurinol (ZYLOPRIM) 100 MG tablet TAKE 200 MG BY MOUTH EVERY DAY 60 tablet 6  . benztropine (COGENTIN) 1 MG tablet Take 1 mg by mouth 2 (two) times daily.  2  . divalproex (DEPAKOTE ER) 500 MG 24 hr tablet Take 1,500 mg by mouth at bedtime.     [name] doxycycline (VIBRAMYCIN) 100 MG capsule Take 1 capsule (100 mg total) by mouth 2 (two) times daily. 20 capsule 0  . furosemide (LASIX) 80 MG tablet TAKE 1 TAB DAILY. MAY ALSO TAKE 1/2 TAB AS NEEDED FOR FLUID. PLEASE CALL [phone] FOR FOLLOW UP. 45 tablet 0  . JARDIANCE 10 MG TABS tablet TAKE ONE TABLET BY MOUTH DAILY 90 tablet 5  . LATUDA 60 MG TABS Take 60 mg by mouth at bedtime.     [name] lisinopril (PRINIVIL,ZESTRIL) 20 MG tablet TAKE 1 TABLET BY MOUTH EVERY DAY 90 tablet 3  . metFORMIN (GLUCOPHAGE) 500 MG tablet TAKE 1 TABLET (500 MG TOTAL) BY MOUTH 2 (TWO) TIMES DAILY WITH A MEAL. 180 tablet 1  . metoprolol tartrate (LOPRESSOR) 25 MG tablet Take 1 tablet (25 mg total) by mouth 2 (two) times daily. 180 tablet 3  . nitroGLYCERIN (NITROSTAT) 0.4 MG SL tablet Place 1 tablet (0.4 mg total) under the tongue every 5 (five) minutes as needed for chest pain. For chest pain 25 tablet 3  . rivaroxaban (XARELTO) 20 MG TABS tablet Take 20 mg by mouth at bedtime 30 tablet 5  . rosuvastatin (CRESTOR) 20 MG tablet TAKE ONE TABLET BY MOUTH DAILY 90 tablet 3  . triamcinolone ointment (KENALOG) 0.1 % APPLY TO AFFECTED AREA TWICE A DAY FOR 14 DAYS (Patient taking differently: Apply 1 application topically 2 (two) times daily as needed (for dry skin). ) 30 g 1  . UNABLE TO FIND Med Name: One Touch Verio blood glucose meter Lot #[id number] x Exp 01/22/2018     No current facility-administered medications on file prior to visit.     Review of Systems:  As per HPI- otherwise negative. She did not otherwise hurt herself when she fell   Physical Examination: Vitals:   03/04/18 0921  BP: 126/84  Pulse: 75  Resp: 20  SpO2: 95%   Vitals:  03/04/18 0921  Weight:  (!) 333 lb (151 kg)  Height: 6' (1.829 m)   Body mass index is 45.16 kg/m. Ideal Body Weight: Weight in (lb) to have BMI = 25: 183.9  GEN: WDWN, NAD, Non-toxic, A & O x 3, tall build, obese  HEENT: Atraumatic, Normocephalic. Neck supple. No masses, No LAD. Ears and Nose: No external deformity. CV: RRR, No M/G/R. No JVD. No thrill. No extra heart sounds. PULM: CTA B, no wheezes, crackles, rhonchi. No retractions. No resp. distress. No accessory muscle use. EXTR: No c/c/e NEURO - slow, she is using crutches and is only partially weight bearing on the right knee  PSYCH: Normally interactive. Conversant. Not depressed or anxious appearing.  Calm demeanor.  Right knee: small effusion and slight swelling, no bruise Ligaments seem stable  She notes anterior and medial joint line tenderness Full extension, some discomfort with flexion past 90  Assessment and Plan: Sprain of right knee, unspecified ligament, initial encounter  Here today with a knee injury she fell and twisted her knee 3 days ago Possible ligamentous or cartilage injury  I made her an appt to see her ortho doc this coming Thursday In the meantime she will continue to ice, elevated her knee and will use crutches/ walker as needed for support   Signed Lamar Blinks, MD  8:15 on Thursday at [facility]  addnd 2:45 pm-Noted on chart review that her weight is up, called pt.  She is up on her home scale as well, 10 lbs  Wt Readings from Last 3 Encounters:  03/04/18 (!) 333 lb (151 kg)  03/01/18 (!) 321 lb (145.6 kg)  01/07/18 (!) 325 lb (147.4 kg)    She notes orthopnea Advised her to call her CHF doctors now to get advice and she will do so

## 2018-03-04 ENCOUNTER — Ambulatory Visit: Payer: BC Managed Care – PPO | Admitting: Family Medicine

## 2018-03-04 ENCOUNTER — Encounter: Payer: Self-pay | Admitting: Family Medicine

## 2018-03-04 ENCOUNTER — Telehealth (HOSPITAL_COMMUNITY): Payer: Self-pay

## 2018-03-04 VITALS — BP 126/84 | HR 75 | Resp 20 | Ht 72.0 in | Wt 333.0 lb

## 2018-03-04 DIAGNOSIS — S8391XA Sprain of unspecified site of right knee, initial encounter: Secondary | ICD-10-CM

## 2018-03-04 NOTE — Telephone Encounter (Signed)
Can take lasix 80 mg BID x 2 days   Would check BMET   Legrand Como "[employer], [place]

## 2018-03-04 NOTE — Patient Instructions (Addendum)
You have an ortho appt on Thursday 2/13 at 8:15 am for your knee  In the meantime please continue to use crutches as needed, elevated and use ice when you can.    Let me know if I can do anything else to help

## 2018-03-04 NOTE — Telephone Encounter (Signed)
Pt called to report her weight has increased 11 lbs since 02/07. On 02/07 322 and 02/10 333. Pt reports edema in her ankles and no SOB. Pt states that she fell on her Right knee on 02/07 and is going to be seeing a surgeon to determine next steps.  Pt is currently taking furosemide 80 mg (TAKE 1 TAB DAILY. MAY ALSO TAKE 1/2 TAB AS NEEDED FOR FLUID). Please advise.

## 2018-03-05 ENCOUNTER — Telehealth: Payer: Self-pay | Admitting: *Deleted

## 2018-03-05 NOTE — Telephone Encounter (Signed)
Pt notified. Verbalizes understanding. Lab appt made. 

## 2018-03-05 NOTE — Telephone Encounter (Signed)
Received request for Medical Records from [employer]; forwarded to Medical Records via email/scan/SLS  

## 2018-03-05 NOTE — Progress Notes (Addendum)
[facility] [address], [place], [place] [address] [phone]  Date:  03/11/2018   Name:  Kathleen [name]   DOB:  12/13/1955   MRN:  [id number]  PCP:  Darreld Mclean, MD    Chief Complaint: Diabetes (4 month follow up) and Herpes Zoster (will check with insurance)   History of Present Illness:  Kathleen [name] is a 63 y.o. very pleasant female patient who presents with the following:  Routine follow-up visit today.  I have actually seen her last week to evaluate her knee injury History of well-controlled diabetes, CAD, hyperlipidemia, paroxysmal A. fib, heart failure  Metformin jardiance Lisinopril Lopressor xarelto crestor   She went to see ortho regarding her knee and they dx arthritis, bone spur However they did not think she needed an MRI at this time, did not suspect a cartilage or ligament injury  She is using a cane but did not need a knee brace She declined a cortisone shot in her knee  Lab Results  Component Value Date   HGBA1C 5.9 10/29/2017   Needs hepatitis C screening Pap; per Dr. Blanche East; start today  She had shingles most recently about 3 years ago   Wt Readings from Last 3 Encounters:  03/11/18 (!) 325 lb (147.4 kg)  03/04/18 (!) 333 lb (151 kg)  03/01/18 (!) 321 lb (145.6 kg)   She contacted cardiology due to weight gain that we noted at our last visit  Her PA-C, Oda Kilts, asked her to double her lasix for 2 days and wanted a BMP, set for tomorrow.  I will do this for her today and will send her BMP result to him Her weight now is down and near her dry weight She is 325 today- her dry weight is 321 per her report   Her last echo was in July: Left ventricle: The cavity size was normal. Wall thickness was   normal. Systolic function was normal. The estimated ejection   fraction was in the range of 55% to 60%. Wall motion was normal;   there were no regional wall motion  abnormalities. Doppler   parameters are consistent with abnormal left ventricular   relaxation (grade 1 diastolic dysfunction).  Patient Active Problem List   Diagnosis Date Noted  . Complete rotator cuff tear or rupture of right shoulder, not specified as traumatic 02/27/2017  . Auditory hallucination 12/13/2016  . Tremor of both hands 11/20/2016  . Gout 06/29/2016  . Type 2 diabetes mellitus ([place]) 12/31/2015  . Chest pain with moderate risk for cardiac etiology 12/29/2015  . Coronary artery dissection   . Pure hypercholesterolemia   . Lung nodule seen on imaging study 09/24/2014  . Paroxysmal atrial fibrillation ([place]) 09/11/2014  . Asthmatic bronchitis with exacerbation 05/25/2014  . Primary hyperparathyroidism ([place]) 04/15/2014  . Palpitations 03/20/2014  . Radicular low back pain 11/24/2013  . Chronic diastolic heart failure ([place]) 10/02/2013  . Shortness of breath 08/31/2013  . Venous stasis dermatitis of both lower extremities 08/19/2013  . Peripheral edema 06/12/2013  . Other malaise and fatigue 06/12/2013  . Nocturia 06/12/2013  . Cellulitis of leg, right 10/11/2012  . Morbid obesity ([place]) 08/09/2012  . Urinary incontinence, urge 07/11/2012  . CAD (coronary artery disease) 10/31/2010  . NEOPLASM OF UNCERTAIN BEHAVIOR OF SKIN 02/11/2010  . Bipolar disorder ([place]) 09/14/2009  . ACUT MYOCARD INFARCT UNS SITE SUBSQT EPIS CARE 07/02/2008  . EDEMA 06/23/2008  .  Hyperlipidemia 05/23/2008  . UNSPECIFIED DISORDER OF THYROID 02/04/2008  . Obesity 02/04/2008  . HYPERTENSION, BENIGN ESSENTIAL 02/04/2008  . MYOCARDIAL INFARCTION, HX OF 03/05/2007    Past Medical History:  Diagnosis Date  . Anemia   . Anxiety   . Asthma   . Atrial fibrillation ([place])   . Bipolar disorder ([place])   . Cancer ([place])    basal cell carcinoma on right hand, papilloma left breast  . CHF (congestive heart failure) ([place])    pt seen at heart/vascular spec clinic 08/14/2014   . Coarse tremors    hands  .  Coronary artery disease   . Depression   . DVT (deep vein thrombosis) in pregnancy    pt. reports that it was post knee surgery, not during pregnancy  . Dyslipidemia   . Fall   . Fatty liver   . Headache    migraines - stopped after menopause  . Heart attack ([place]) 02/2007   non-q-wave with second septal perforator  . Hypertension   . Hypothyroidism    history of took medication, has resolved  . Knee pain, left   . Lower extremity deep venous thrombosis (H[facility])    20 years ago   . Measles    hx of in childhood   . Morbid obesity with BMI of 40.0-44.9, adult ([place])   . Pain at surgical incision    Left breast from procedure 09/11/16  . Pneumonia    hx of   . PONV (postoperative nausea and vomiting)    also slow to wake up  . Psychiatric hospitalization 05/2008  . Shingles    20 years ago   . Shortness of breath dyspnea    exercise   . Type 2 diabetes mellitus ([place]) 12/31/2015  . Urinary incontinence     Past Surgical History:  Procedure Laterality Date  . BASAL CELL CARCINOMA EXCISION    . BREAST LUMPECTOMY WITH RADIOACTIVE SEED LOCALIZATION Left 09/11/2016   Procedure: LEFT BREAST LUMPECTOMY WITH RADIOACTIVE SEED LOCALIZATION;  Surgeon: Excell Seltzer, MD;  Location: [facility];  Service: General;  Laterality: Left;  . BREAST MASS EXCISION     age 19, benign tumor  . CARDIAC CATHETERIZATION    . CARDIAC CATHETERIZATION N/A 12/30/2015   Procedure: Left Heart Cath and Coronary Angiography;  Surgeon: Belva Crome, MD;  Location: [facility];  Service: Cardiovascular;  Laterality: N/A;  . COLONOSCOPY    . DILATATION & CURETTAGE/HYSTEROSCOPY WITH MYOSURE N/A 09/22/2016   Procedure: DILATATION & CURETTAGE/HYSTEROSCOPY WITH MYOSURE;  Surgeon: Molli Posey, MD;  Location: [facility];  Service: Gynecology;  Laterality: N/A;  . DILATION AND CURETTAGE OF UTERUS    . KNEE SURGERY     right, removed cartilage  . PARATHYROIDECTOMY N/A 08/25/2014   Procedure: PARATHYROIDECTOMY;   Surgeon: Armandina Gemma, MD;  Location: [facility];  Service: General;  Laterality: N/A;  . SHOULDER ARTHROSCOPY WITH ROTATOR CUFF REPAIR AND SUBACROMIAL DECOMPRESSION Right 02/27/2017   Procedure: Right shoulder arthroscopic rotator cuff repair with biceps tenodesis and subacromial decompression;  Surgeon: Nicholes Stairs, MD;  Location: [facility];  Service: Orthopedics;  Laterality: Right;  150 mins  . TONSILLECTOMY    . TUBAL LIGATION      Social History   Tobacco Use  . Smoking status: Never Smoker  . Smokeless tobacco: Never Used  Substance Use Topics  . Alcohol use: Yes    Alcohol/week: 0.0 standard drinks    Comment: occ.  . Drug use: No  Family History  Problem Relation Age of Onset  . Breast cancer Mother 83  . Alcohol abuse Mother   . Alzheimer's disease Mother   . Alcohol abuse Father   . Alzheimer's disease Father   . Parkinson's disease Father   . Alcohol abuse Maternal Grandfather   . Drug abuse Maternal Grandfather   . Alcohol abuse Maternal Grandmother   . Alcohol abuse Paternal Grandfather   . Alcohol abuse Paternal Grandmother   . Schizophrenia Cousin   . Diabetes Brother   . Hypertension Brother     Allergies  Allergen Reactions  . Eggs Or Egg-Derived Products Hives and Other (See Comments)    Can eat foods with cooked eggs; CANNOT handle when part of flu vaccine, etc.   . Lamictal [Lamotrigine] Hives and Rash    Medication list has been reviewed and updated.  Current Outpatient Medications on File Prior to Visit  Medication Sig Dispense Refill  . albuterol (PROVENTIL HFA;VENTOLIN HFA) 108 (90 BASE) MCG/ACT inhaler Inhale 2 puffs into the lungs every 6 (six) hours as needed for wheezing. 1 Inhaler 1  . allopurinol (ZYLOPRIM) 100 MG tablet TAKE 200 MG BY MOUTH EVERY DAY 60 tablet 6  . benztropine (COGENTIN) 1 MG tablet Take 1 mg by mouth 2 (two) times daily.  2  . divalproex (DEPAKOTE ER) 500 MG 24 hr tablet Take 1,500 mg by mouth at bedtime.     .  furosemide (LASIX) 80 MG tablet TAKE 1 TAB DAILY. MAY ALSO TAKE 1/2 TAB AS NEEDED FOR FLUID. PLEASE CALL [phone] FOR FOLLOW UP. 45 tablet 0  . JARDIANCE 10 MG TABS tablet TAKE ONE TABLET BY MOUTH DAILY 90 tablet 5  . LATUDA 60 MG TABS Take 60 mg by mouth at bedtime.     [name] lisinopril (PRINIVIL,ZESTRIL) 20 MG tablet TAKE 1 TABLET BY MOUTH EVERY DAY 90 tablet 3  . metFORMIN (GLUCOPHAGE) 500 MG tablet TAKE 1 TABLET (500 MG TOTAL) BY MOUTH 2 (TWO) TIMES DAILY WITH A MEAL. 180 tablet 1  . metoprolol tartrate (LOPRESSOR) 25 MG tablet Take 1 tablet (25 mg total) by mouth 2 (two) times daily. 180 tablet 3  . nitroGLYCERIN (NITROSTAT) 0.4 MG SL tablet Place 1 tablet (0.4 mg total) under the tongue every 5 (five) minutes as needed for chest pain. For chest pain 25 tablet 3  . rivaroxaban (XARELTO) 20 MG TABS tablet Take 20 mg by mouth at bedtime 30 tablet 5  . rosuvastatin (CRESTOR) 20 MG tablet TAKE ONE TABLET BY MOUTH DAILY 90 tablet 3  . triamcinolone ointment (KENALOG) 0.1 % APPLY TO AFFECTED AREA TWICE A DAY FOR 14 DAYS (Patient taking differently: Apply 1 application topically 2 (two) times daily as needed (for dry skin). ) 30 g 1  . UNABLE TO FIND Med Name: One Touch Verio blood glucose meter Lot #[id number] x Exp 01/22/2018     No current facility-administered medications on file prior to visit.     Review of Systems:  As per HPI- otherwise negative. No fever or chills No CP or SOB She feels like her breathing is better since her weight went back down   Physical Examination: Vitals:   03/11/18 0851  BP: 118/80  Pulse: 85  Resp: 18  Temp: 98.3 F (36.8 C)  SpO2: 98%   Vitals:   03/11/18 0851  Weight: (!) 325 lb (147.4 kg)  Height: 6' (1.829 m)   Body mass index is 44.08 kg/m. Ideal Body Weight: Weight in (lb) to have  BMI = 25: 183.9  GEN: WDWN, NAD, Non-toxic, A & O x 3, obese, looks well  HEENT: Atraumatic, Normocephalic. Neck supple. No masses, No LAD. Ears and Nose: No  external deformity. CV: RRR, No M/G/R. No JVD. No thrill. No extra heart sounds. PULM: CTA B, no wheezes, crackles, rhonchi. No retractions. No resp. distress. No accessory muscle use. EXTR: No c/c/e NEURO Normal gait but moving a little slow due to her knee, using a cane  PSYCH: Normally interactive. Conversant. Not depressed or anxious appearing.  Calm demeanor.    Assessment and Plan: Polycythemia - Plan: CBC (INCLUDES DIFF/PLT) WITH PATHOLOGIST REVIEW, CANCELED: CBC, CANCELED: Pathologist smear review  Type 2 diabetes mellitus without complication, unspecified whether long term insulin use ([place]) - Plan: Hemoglobin A1c  Encounter for hepatitis C screening test for low risk patient - Plan: Hepatitis C antibody  Medication monitoring encounter - Plan: Basic metabolic panel  Immunization due - Plan: Varicella-zoster vaccine IM (Shingrix)  Following up today Ordered a CBC to follow-up on polycythemia A1c today to follow-up on her DM Hep C pending Her weight is back down towards normal- BMP today Sent to her cardiology PA once result in shingrix #1 today   Signed Lamar Blinks, MD  Received her labs so far, as follows Polycythemia is slightly less been in November Her GFR is reduced, will need to have her hold metformin for now A1c looks okay Results for orders placed or performed in visit on 03/11/18  Hemoglobin A1c  Result Value Ref Range   Hgb A1c MFr Bld 6.3 4.6 - 6.5 %  Basic metabolic panel  Result Value Ref Range   Sodium 141 135 - 145 mEq/L   Potassium 4.3 3.5 - 5.1 mEq/L   Chloride 98 96 - 112 mEq/L   CO2 32 19 - 32 mEq/L   Glucose, Bld 119 (H) 70 - 99 mg/dL   BUN 44 (H) 6 - 23 mg/dL   Creatinine, Ser 1.37 (H) 0.40 - 1.20 mg/dL   Calcium 9.2 8.4 - 10.5 mg/dL   GFR 39.01 (L) >60.00 mL/min  CBC (INCLUDES DIFF/PLT) WITH PATHOLOGIST REVIEW  Result Value Ref Range   WBC 4.6 3.8 - 10.8 Thousand/uL   RBC 5.41 (H) 3.80 - 5.10 Million/uL   Hemoglobin 15.9 (H) 11.7  - 15.5 g/dL   HCT 46.9 (H) 35.0 - 45.0 %   MCV 86.7 80.0 - 100.0 fL   MCH 29.4 27.0 - 33.0 pg   MCHC 33.9 32.0 - 36.0 g/dL   RDW 14.3 11.0 - 15.0 %   Platelets 141 140 - 400 Thousand/uL   MPV 11.9 7.5 - 12.5 fL   Neutro Abs 3,022 1,500 - 7,800 cells/uL   Lymphs Abs 1,086 850 - 3,900 cells/uL   Absolute Monocytes 391 200 - 950 cells/uL   Eosinophils Absolute 83 15 - 500 cells/uL   Basophils Absolute 18 0 - 200 cells/uL   Neutrophils Relative % 65.7 %   Total Lymphocyte 23.6 %   Monocytes Relative 8.5 %   Eosinophils Relative 1.8 %   Basophils Relative 0.4 %   Comment     addnd 3/3- received repeat BMP Message to pt  GFR is 48- improved Ok to go back on metformin but need to monitor closely - she takes 500 BID Repeat BMP in 2 weeks

## 2018-03-07 ENCOUNTER — Ambulatory Visit: Payer: Self-pay | Admitting: Family Medicine

## 2018-03-11 ENCOUNTER — Encounter: Payer: Self-pay | Admitting: Family Medicine

## 2018-03-11 ENCOUNTER — Ambulatory Visit: Payer: BC Managed Care – PPO | Admitting: Family Medicine

## 2018-03-11 VITALS — BP 118/80 | HR 85 | Temp 98.3°F | Resp 18 | Ht 72.0 in | Wt 325.0 lb

## 2018-03-11 DIAGNOSIS — Z1159 Encounter for screening for other viral diseases: Secondary | ICD-10-CM | POA: Diagnosis not present

## 2018-03-11 DIAGNOSIS — Z5181 Encounter for therapeutic drug level monitoring: Secondary | ICD-10-CM | POA: Diagnosis not present

## 2018-03-11 DIAGNOSIS — E119 Type 2 diabetes mellitus without complications: Secondary | ICD-10-CM | POA: Diagnosis not present

## 2018-03-11 DIAGNOSIS — Z23 Encounter for immunization: Secondary | ICD-10-CM

## 2018-03-11 DIAGNOSIS — D751 Secondary polycythemia: Secondary | ICD-10-CM | POA: Diagnosis not present

## 2018-03-11 DIAGNOSIS — N289 Disorder of kidney and ureter, unspecified: Secondary | ICD-10-CM

## 2018-03-11 LAB — BASIC METABOLIC PANEL
BUN: 44 mg/dL — ABNORMAL HIGH (ref 6–23)
CO2: 32 mEq/L (ref 19–32)
Calcium: 9.2 mg/dL (ref 8.4–10.5)
Chloride: 98 mEq/L (ref 96–112)
Creatinine, Ser: 1.37 mg/dL — ABNORMAL HIGH (ref 0.40–1.20)
GFR: 39.01 mL/min — ABNORMAL LOW (ref >60.00)
GLUCOSE: 119 mg/dL — AB (ref 70–99)
Potassium: 4.3 mEq/L (ref 3.5–5.1)
SODIUM: 141 meq/L (ref 135–145)

## 2018-03-11 LAB — HEMOGLOBIN A1C: Hgb A1c MFr Bld: 6.3 % (ref 4.6–6.5)

## 2018-03-11 NOTE — Patient Instructions (Signed)
It was very nice to see you today, I will be in touch with your labs I will send the results of your basic metabolic profile to your cardiology PA Continue to monitor your weight You got your first shingles vaccine today, please make a nurse visit for the second dose in 2 to 6 months Otherwise let us plan to visit in 4 to 6 months, assuming your labs are reassuring

## 2018-03-11 NOTE — Addendum Note (Signed)
Addended by: Lamar Blinks C on: 03/11/2018 09:14 PM   Modules accepted: Orders

## 2018-03-12 ENCOUNTER — Other Ambulatory Visit (HOSPITAL_COMMUNITY): Payer: Self-pay

## 2018-03-12 LAB — CBC (INCLUDES DIFF/PLT) WITH PATHOLOGIST REVIEW
Absolute Monocytes: 391 cells/uL (ref 200–950)
BASOS PCT: 0.4 %
Basophils Absolute: 18 cells/uL (ref 0–200)
Eosinophils Absolute: 83 cells/uL (ref 15–500)
Eosinophils Relative: 1.8 %
HCT: 46.9 % — ABNORMAL HIGH (ref 35.0–45.0)
Hemoglobin: 15.9 g/dL — ABNORMAL HIGH (ref 11.7–15.5)
Lymphs Abs: 1086 cells/uL (ref 850–3900)
MCH: 29.4 pg (ref 27.0–33.0)
MCHC: 33.9 g/dL (ref 32.0–36.0)
MCV: 86.7 fL (ref 80.0–100.0)
MONOS PCT: 8.5 %
MPV: 11.9 fL (ref 7.5–12.5)
NEUTROS ABS: 3022 {cells}/uL (ref 1500–7800)
Neutrophils Relative %: 65.7 %
PLATELETS: 141 10*3/uL (ref 140–400)
RBC: 5.41 10*6/uL — ABNORMAL HIGH (ref 3.80–5.10)
RDW: 14.3 % (ref 11.0–15.0)
TOTAL LYMPHOCYTE: 23.6 %
WBC: 4.6 10*3/uL (ref 3.8–10.8)

## 2018-03-12 LAB — HEPATITIS C ANTIBODY
Hepatitis C Ab: NONREACTIVE
SIGNAL TO CUT-OFF: 0.01 (ref <1.00)

## 2018-03-13 ENCOUNTER — Encounter (HOSPITAL_COMMUNITY): Payer: Self-pay

## 2018-03-18 ENCOUNTER — Other Ambulatory Visit (HOSPITAL_COMMUNITY): Payer: Self-pay | Admitting: *Deleted

## 2018-03-18 ENCOUNTER — Encounter (HOSPITAL_COMMUNITY): Payer: Self-pay

## 2018-03-18 MED ORDER — FUROSEMIDE 80 MG PO TABS: ORAL_TABLET | ORAL | 3 refills | Status: DC

## 2018-03-19 ENCOUNTER — Telehealth: Payer: Self-pay | Admitting: *Deleted

## 2018-03-19 NOTE — Telephone Encounter (Signed)
Received request for Medical Records from [employer]; forwarded to Medical Records via email/scan/SLS  

## 2018-03-21 ENCOUNTER — Encounter: Payer: Self-pay | Admitting: Family Medicine

## 2018-03-26 ENCOUNTER — Encounter: Payer: Self-pay | Admitting: Family Medicine

## 2018-03-26 ENCOUNTER — Other Ambulatory Visit (HOSPITAL_COMMUNITY): Payer: Self-pay | Admitting: Internal Medicine

## 2018-03-26 ENCOUNTER — Other Ambulatory Visit (INDEPENDENT_AMBULATORY_CARE_PROVIDER_SITE_OTHER): Payer: BC Managed Care – PPO

## 2018-03-26 DIAGNOSIS — N289 Disorder of kidney and ureter, unspecified: Secondary | ICD-10-CM | POA: Diagnosis not present

## 2018-03-26 LAB — BASIC METABOLIC PANEL
BUN: 33 mg/dL — ABNORMAL HIGH (ref 6–23)
CO2: 29 mEq/L (ref 19–32)
Calcium: 9.3 mg/dL (ref 8.4–10.5)
Chloride: 98 mEq/L (ref 96–112)
Creatinine, Ser: 1.13 mg/dL (ref 0.40–1.20)
GFR: 48.71 mL/min — ABNORMAL LOW (ref >60.00)
GLUCOSE: 152 mg/dL — AB (ref 70–99)
Potassium: 3.9 mEq/L (ref 3.5–5.1)
Sodium: 141 mEq/L (ref 135–145)

## 2018-03-26 NOTE — Addendum Note (Signed)
Addended by: Lamar Blinks C on: 03/26/2018 02:26 PM   Modules accepted: Orders

## 2018-04-04 ENCOUNTER — Other Ambulatory Visit (HOSPITAL_COMMUNITY): Payer: Self-pay | Admitting: Internal Medicine

## 2018-04-05 ENCOUNTER — Other Ambulatory Visit: Payer: Self-pay

## 2018-04-05 MED ORDER — ALLOPURINOL 100 MG PO TABS: ORAL_TABLET | ORAL | 6 refills | Status: DC

## 2018-04-06 NOTE — Progress Notes (Addendum)
[facility] [place], [place], [place] [address] [phone]  Date:  04/08/2018   Name:  Kathleen [name]   DOB:  1955-12-22   MRN:  [id number]  PCP:  Darreld Mclean, MD    Chief Complaint: Genital Pain (possible yeast infection, bleeding, 1 week, treating with monistat, not helping)   History of Present Illness:  Kathleen [name] is a 63 y.o. very pleasant female patient who presents with the following:  Patient with history of well-controlled diabetes, hyperlipidemia, paroxysmal A. fib, CAD, chronic diastolic heart failure, MI in 2009, primary hyperparathyroidism (recent calcium ok), chronic venous stasis  Here today with concern of pain in her genital area- she has noted itching for about 2 weeks.  Also some cracking and bleeding of the skin in her inguinal folds bilaterally She is using OTC Monistat, but it does not seem to be helping No vaginal discharge noted  No urinary sx No fever  She is on Jardiance It looks like she is due for a pap, can do today  We have been following her kidney function, can repeat for her today-she had plan to have this lab drawn tomorrow anyway  Lab Results  Component Value Date   HGBA1C 6.3 03/11/2018   Per most recent cardiology note, August: ASSESSMENT AND PLAN 1. Chronic diastolic HF:  -ECHO 02/6331 EF Normal EF 55-60% Grade IDD  - Echo 7/19 EF 55-60% Personally reviewed - Volume status ok  2. PAF - CHADSVASC = 3. - In NSR. Continue bb and xarelto. 3. Morbid obesity: Body mass index is 44.88 kg/m. 4. CAD:  - No s/s ischemia - Continue ASA 81, Plavix, statin. 5. HTN:   - BP remains elevated. Will add Jardiance and see if improves.  - Can add spiro 12.5 down the road if needed 6. Snoring:  - No OSA on sleep study.  7. Hyperparathyroidism-   - s/p partial parathyroidectomy 08/2014. Per PCP.  8. Palpitations - Holter monitor 7/19 with PACs and PVCs no sustained  arryhythmias - Improved 9. DM2 - Start Jardiance 10 mg daily     Patient Active Problem List   Diagnosis Date Noted  . Complete rotator cuff tear or rupture of right shoulder, not specified as traumatic 02/27/2017  . Auditory hallucination 12/13/2016  . Tremor of both hands 11/20/2016  . Gout 06/29/2016  . Type 2 diabetes mellitus ([place]) 12/31/2015  . Chest pain with moderate risk for cardiac etiology 12/29/2015  . Coronary artery dissection   . Pure hypercholesterolemia   . Lung nodule seen on imaging study 09/24/2014  . Paroxysmal atrial fibrillation ([name]) 09/11/2014  . Asthmatic bronchitis with exacerbation 05/25/2014  . Primary hyperparathyroidism ([place]) 04/15/2014  . Palpitations 03/20/2014  . Radicular low back pain 11/24/2013  . Chronic diastolic heart failure ([place]) 10/02/2013  . Shortness of breath 08/31/2013  . Venous stasis dermatitis of both lower extremities 08/19/2013  . Peripheral edema 06/12/2013  . Other malaise and fatigue 06/12/2013  . Nocturia 06/12/2013  . Cellulitis of leg, right 10/11/2012  . Morbid obesity ([place]) 08/09/2012  . Urinary incontinence, urge 07/11/2012  . CAD (coronary artery disease) 10/31/2010  . NEOPLASM OF UNCERTAIN BEHAVIOR OF SKIN 02/11/2010  . Bipolar disorder ([place]) 09/14/2009  . ACUT MYOCARD INFARCT UNS SITE SUBSQT EPIS CARE 07/02/2008  . EDEMA 06/23/2008  . Hyperlipidemia 05/23/2008  . UNSPECIFIED DISORDER OF THYROID 02/04/2008  . Obesity 02/04/2008  . HYPERTENSION, BENIGN ESSENTIAL  02/04/2008  . MYOCARDIAL INFARCTION, HX OF 03/05/2007    Past Medical History:  Diagnosis Date  . Anemia   . Anxiety   . Asthma   . Atrial fibrillation ([place])   . Bipolar disorder ([place])   . Cancer ([place])    basal cell carcinoma on right hand, papilloma left breast  . CHF (congestive heart failure) ([place])    pt seen at heart/vascular spec clinic 08/14/2014   . Coarse tremors    hands  . Coronary artery disease   . Depression   . DVT (deep vein  thrombosis) in pregnancy    pt. reports that it was post knee surgery, not during pregnancy  . Dyslipidemia   . Fall   . Fatty liver   . Headache    migraines - stopped after menopause  . Heart attack ([place]) 02/2007   non-q-wave with second septal perforator  . Hypertension   . Hypothyroidism    history of took medication, has resolved  . Knee pain, left   . Lower extremity deep venous thrombosis (HCC)    20 years ago   . Measles    hx of in childhood   . Morbid obesity with BMI of 40.0-44.9, adult ([place])   . Pain at surgical incision    Left breast from procedure 09/11/16  . Pneumonia    hx of   . PONV (postoperative nausea and vomiting)    also slow to wake up  . Psychiatric hospitalization 05/2008  . Shingles    20 years ago   . Shortness of breath dyspnea    exercise   . Type 2 diabetes mellitus ([place]) 12/31/2015  . Urinary incontinence     Past Surgical History:  Procedure Laterality Date  . BASAL CELL CARCINOMA EXCISION    . BREAST LUMPECTOMY WITH RADIOACTIVE SEED LOCALIZATION Left 09/11/2016   Procedure: LEFT BREAST LUMPECTOMY WITH RADIOACTIVE SEED LOCALIZATION;  Surgeon: Excell Seltzer, MD;  Location: [facility];  Service: General;  Laterality: Left;  . BREAST MASS EXCISION     age 78, benign tumor  . CARDIAC CATHETERIZATION    . CARDIAC CATHETERIZATION N/A 12/30/2015   Procedure: Left Heart Cath and Coronary Angiography;  Surgeon: Belva Crome, MD;  Location: [facility];  Service: Cardiovascular;  Laterality: N/A;  . COLONOSCOPY    . DILATATION & CURETTAGE/HYSTEROSCOPY WITH MYOSURE N/A 09/22/2016   Procedure: DILATATION & CURETTAGE/HYSTEROSCOPY WITH MYOSURE;  Surgeon: Molli Posey, MD;  Location: [facility];  Service: Gynecology;  Laterality: N/A;  . DILATION AND CURETTAGE OF UTERUS    . KNEE SURGERY     right, removed cartilage  . PARATHYROIDECTOMY N/A 08/25/2014   Procedure: PARATHYROIDECTOMY;  Surgeon: Armandina Gemma, MD;  Location: [facility];  Service: General;   Laterality: N/A;  . SHOULDER ARTHROSCOPY WITH ROTATOR CUFF REPAIR AND SUBACROMIAL DECOMPRESSION Right 02/27/2017   Procedure: Right shoulder arthroscopic rotator cuff repair with biceps tenodesis and subacromial decompression;  Surgeon: Nicholes Stairs, MD;  Location: [facility];  Service: Orthopedics;  Laterality: Right;  150 mins  . TONSILLECTOMY    . TUBAL LIGATION      Social History   Tobacco Use  . Smoking status: Never Smoker  . Smokeless tobacco: Never Used  Substance Use Topics  . Alcohol use: Yes    Alcohol/week: 0.0 standard drinks    Comment: occ.  . Drug use: No    Family History  Problem Relation Age of Onset  . Breast cancer Mother 22  .  Alcohol abuse Mother   . Alzheimer's disease Mother   . Alcohol abuse Father   . Alzheimer's disease Father   . Parkinson's disease Father   . Alcohol abuse Maternal Grandfather   . Drug abuse Maternal Grandfather   . Alcohol abuse Maternal Grandmother   . Alcohol abuse Paternal Grandfather   . Alcohol abuse Paternal Grandmother   . Schizophrenia Cousin   . Diabetes Brother   . Hypertension Brother     Allergies  Allergen Reactions  . Eggs Or Egg-Derived Products Hives and Other (See Comments)    Can eat foods with cooked eggs; CANNOT handle when part of flu vaccine, etc.   . Lamictal [Lamotrigine] Hives and Rash    Medication list has been reviewed and updated.  Current Outpatient Medications on File Prior to Visit  Medication Sig Dispense Refill  . albuterol (PROVENTIL HFA;VENTOLIN HFA) 108 (90 BASE) MCG/ACT inhaler Inhale 2 puffs into the lungs every 6 (six) hours as needed for wheezing. 1 Inhaler 1  . allopurinol (ZYLOPRIM) 100 MG tablet TAKE 200 MG BY MOUTH EVERY DAY 60 tablet 6  . benztropine (COGENTIN) 1 MG tablet Take 1 mg by mouth 2 (two) times daily.  2  . divalproex (DEPAKOTE ER) 500 MG 24 hr tablet Take 1,500 mg by mouth at bedtime.     . furosemide (LASIX) 80 MG tablet TAKE 1 TAB DAILY. MAY ALSO TAKE 1/2  TAB AS NEEDED FOR FLUID. 45 tablet 3  . JARDIANCE 10 MG TABS tablet TAKE ONE TABLET BY MOUTH DAILY 90 tablet 5  . LATUDA 60 MG TABS Take 60 mg by mouth at bedtime.     [name] lisinopril (PRINIVIL,ZESTRIL) 20 MG tablet TAKE 1 TABLET BY MOUTH EVERY DAY 90 tablet 3  . metFORMIN (GLUCOPHAGE) 500 MG tablet TAKE 1 TABLET (500 MG TOTAL) BY MOUTH 2 (TWO) TIMES DAILY WITH A MEAL. 180 tablet 1  . metoprolol tartrate (LOPRESSOR) 25 MG tablet Take 1 tablet (25 mg total) by mouth 2 (two) times daily. 180 tablet 3  . nitroGLYCERIN (NITROSTAT) 0.4 MG SL tablet Place 1 tablet (0.4 mg total) under the tongue every 5 (five) minutes as needed for chest pain. For chest pain 25 tablet 3  . rivaroxaban (XARELTO) 20 MG TABS tablet Take 20 mg by mouth at bedtime 30 tablet 5  . rosuvastatin (CRESTOR) 20 MG tablet TAKE ONE TABLET BY MOUTH DAILY 90 tablet 3  . triamcinolone ointment (KENALOG) 0.1 % APPLY TO AFFECTED AREA TWICE A DAY FOR 14 DAYS (Patient taking differently: Apply 1 application topically 2 (two) times daily as needed (for dry skin). ) 30 g 1  . UNABLE TO FIND Med Name: One Touch Verio blood glucose meter Lot #[id number] x Exp 01/22/2018     No current facility-administered medications on file prior to visit.     Review of Systems:  As per HPI- otherwise negative. No fever chills, no chest pain or shortness of breath  Physical Examination: Vitals:   04/08/18 1026  BP: 126/88  Pulse: 88  Resp: 16  Temp: 98.2 F (36.8 C)  SpO2: 99%   Vitals:   04/08/18 1026  Weight: (!) 325 lb (147.4 kg)  Height: 6' (1.829 m)   Body mass index is 44.08 kg/m. Ideal Body Weight: Weight in (lb) to have BMI = 25: 183.9  GEN: WDWN, NAD, Non-toxic, A & O x 3, obese, looks well HEENT: Atraumatic, Normocephalic. Neck supple. No masses, No LAD. Ears and Nose: No external deformity. CV: RRR,  No M/G/R. No JVD. No thrill. No extra heart sounds. PULM: CTA B, no wheezes, crackles, rhonchi. No retractions. No resp. distress.  No accessory muscle use. ABD: S, NT, ND, +BS. No rebound. No HSM. EXTR: No c/c/e NEURO Normal gait.  PSYCH: Normally interactive. Conversant. Not depressed or anxious appearing.  Calm demeanor.  Creat clearance is over 100 per CG equation  She has minimal cracking of the skin in the left inguinal fold. Pelvic: Mild vulvar and vaginal inflammation, no vaginal lesions or discharge. Uterus normal, no CMT, no adnexal tendereness or masses No evidence of Fournier's gangrene  Assessment and Plan: Type 2 diabetes mellitus without complication, unspecified whether long term insulin use ([place]) - Plan: Basic metabolic panel  Screening for cervical cancer - Plan: Cytology - PAP, CANCELED: Cytology - PAP  Vaginal itching - Plan: fluconazole (DIFLUCAN) 150 MG tablet, Cervicovaginal ancillary only( [place]), CANCELED: Cervicovaginal ancillary only( [place])  Here today with concern of vaginal and vulvar itching.  Suspect that she has a yeast infection, may also be sensitive to Monistat.  We will have her stop this medication, start on oral Diflucan. As of last labs her creatinine clearance is adequate to use Diflucan and Xarelto, GFR okay for metformin.  We will recheck this for her again today. Assuming her kidney function is okay, she may take Diflucan every 3 days for 3 doses if needed-advised that there can be an increased bleeding risk, to use as little Diflucan as possible. For inguinal folds, have suggested Desitin and powder, perhaps topical terbinafine Follow-up on renal function today Recent A1c favorable  Signed Lamar Blinks, MD  Received her labs, message to patient Creat clearance estimated at 118 ml.min Results for orders placed or performed in visit on 02/58/52  Basic metabolic panel  Result Value Ref Range   Sodium 143 135 - 145 mEq/L   Potassium 4.6 3.5 - 5.1 mEq/L   Chloride 101 96 - 112 mEq/L   CO2 31 19 - 32 mEq/L   Glucose, Bld 122 (H) 70 - 99 mg/dL   BUN 29 (H)  6 - 23 mg/dL   Creatinine, Ser 1.14 0.40 - 1.20 mg/dL   Calcium 9.5 8.4 - 10.5 mg/dL   GFR 48.21 (L) >60.00 mL/min  Cervicovaginal ancillary only( [place])  Result Value Ref Range   Bacterial vaginitis Negative for Bacterial Vaginitis Microorganisms    Candida vaginitis **POSITIVE for Candida glabrata** (A)    Addendum 3/17, also received her wet prep.  Positive for yeast-however she has Candida glabrata, a less common organism. Fluconazole may be successful, if not we may need an alternative treatment Message to patient

## 2018-04-08 ENCOUNTER — Ambulatory Visit: Payer: BC Managed Care – PPO | Admitting: Family Medicine

## 2018-04-08 ENCOUNTER — Other Ambulatory Visit (HOSPITAL_COMMUNITY)
Admission: RE | Admit: 2018-04-08 | Discharge: 2018-04-08 | Disposition: A | Discharge status: Home / Self Care | Payer: BC Managed Care – PPO | Source: Ambulatory Visit | Attending: Family Medicine | Admitting: Family Medicine

## 2018-04-08 ENCOUNTER — Other Ambulatory Visit: Payer: Self-pay

## 2018-04-08 ENCOUNTER — Encounter: Payer: Self-pay | Admitting: Family Medicine

## 2018-04-08 VITALS — BP 126/88 | HR 88 | Temp 98.2°F | Resp 16 | Ht 72.0 in | Wt 325.0 lb

## 2018-04-08 DIAGNOSIS — Z1151 Encounter for screening for human papillomavirus (HPV): Secondary | ICD-10-CM | POA: Insufficient documentation

## 2018-04-08 DIAGNOSIS — E119 Type 2 diabetes mellitus without complications: Secondary | ICD-10-CM | POA: Diagnosis not present

## 2018-04-08 DIAGNOSIS — Z780 Asymptomatic menopausal state: Secondary | ICD-10-CM | POA: Insufficient documentation

## 2018-04-08 DIAGNOSIS — Z124 Encounter for screening for malignant neoplasm of cervix: Secondary | ICD-10-CM

## 2018-04-08 DIAGNOSIS — N898 Other specified noninflammatory disorders of vagina: Secondary | ICD-10-CM | POA: Insufficient documentation

## 2018-04-08 LAB — BASIC METABOLIC PANEL
BUN: 29 mg/dL — ABNORMAL HIGH (ref 6–23)
CALCIUM: 9.5 mg/dL (ref 8.4–10.5)
CO2: 31 mEq/L (ref 19–32)
Chloride: 101 mEq/L (ref 96–112)
Creatinine, Ser: 1.14 mg/dL (ref 0.40–1.20)
GFR: 48.21 mL/min — ABNORMAL LOW (ref >60.00)
Glucose, Bld: 122 mg/dL — ABNORMAL HIGH (ref 70–99)
Potassium: 4.6 mEq/L (ref 3.5–5.1)
Sodium: 143 mEq/L (ref 135–145)

## 2018-04-08 MED ORDER — FLUCONAZOLE 150 MG PO TABS: 150.0000 mg | ORAL_TABLET | Freq: Once | ORAL | 1 refills | Status: AC

## 2018-04-08 NOTE — Patient Instructions (Signed)
We are going to use diflucan by mouth for your current vaginal itching.  Take every 3 days for up to 3 doses as needed However use as little as you can, as this medication could increase risk of bleeding with xarelto  I will be in touch with your pap asap   Please let me know if you are getting worse or not feeling better soon Also please try desitin and/ or power for the groin folds as needed

## 2018-04-09 ENCOUNTER — Encounter: Payer: Self-pay | Admitting: Family Medicine

## 2018-04-09 ENCOUNTER — Other Ambulatory Visit: Payer: Self-pay

## 2018-04-09 LAB — CERVICOVAGINAL ANCILLARY ONLY
Bacterial vaginitis: NEGATIVE
CANDIDA VAGINITIS: POSITIVE — AB

## 2018-04-10 ENCOUNTER — Encounter: Payer: Self-pay | Admitting: Family Medicine

## 2018-04-10 LAB — CYTOLOGY - PAP
Diagnosis: NEGATIVE
HPV: NOT DETECTED

## 2018-04-19 ENCOUNTER — Other Ambulatory Visit (HOSPITAL_COMMUNITY): Payer: Self-pay | Admitting: Internal Medicine

## 2018-04-21 ENCOUNTER — Other Ambulatory Visit: Payer: Self-pay | Admitting: Family Medicine

## 2018-04-24 ENCOUNTER — Encounter (HOSPITAL_COMMUNITY): Payer: Self-pay | Admitting: Internal Medicine

## 2018-05-13 ENCOUNTER — Encounter: Payer: Self-pay | Admitting: Family Medicine

## 2018-06-05 ENCOUNTER — Other Ambulatory Visit: Payer: Self-pay

## 2018-06-05 MED ORDER — LISINOPRIL 20 MG PO TABS: 20.0000 mg | ORAL_TABLET | Freq: Every day | ORAL | 1 refills | Status: DC

## 2018-06-19 ENCOUNTER — Telehealth (HOSPITAL_COMMUNITY): Payer: Self-pay

## 2018-06-19 NOTE — Telephone Encounter (Signed)

## 2018-06-20 ENCOUNTER — Ambulatory Visit (HOSPITAL_COMMUNITY)
Admission: RE | Admit: 2018-06-20 | Discharge: 2018-06-20 | Disposition: A | Discharge status: Home / Self Care | Payer: BC Managed Care – PPO | Source: Ambulatory Visit | Attending: Internal Medicine | Admitting: Internal Medicine

## 2018-06-20 ENCOUNTER — Other Ambulatory Visit: Payer: Self-pay

## 2018-06-20 ENCOUNTER — Encounter (HOSPITAL_COMMUNITY): Payer: Self-pay | Admitting: Internal Medicine

## 2018-06-20 VITALS — BP 134/86 | HR 86 | Wt 327.4 lb

## 2018-06-20 DIAGNOSIS — I5032 Chronic diastolic (congestive) heart failure: Secondary | ICD-10-CM | POA: Diagnosis not present

## 2018-06-20 DIAGNOSIS — Z6841 Body Mass Index (BMI) 40.0 and over, adult: Secondary | ICD-10-CM | POA: Insufficient documentation

## 2018-06-20 DIAGNOSIS — I480 Paroxysmal atrial fibrillation: Secondary | ICD-10-CM | POA: Diagnosis not present

## 2018-06-20 DIAGNOSIS — E785 Hyperlipidemia, unspecified: Secondary | ICD-10-CM | POA: Diagnosis not present

## 2018-06-20 DIAGNOSIS — E119 Type 2 diabetes mellitus without complications: Secondary | ICD-10-CM | POA: Diagnosis not present

## 2018-06-20 DIAGNOSIS — I2510 Atherosclerotic heart disease of native coronary artery without angina pectoris: Secondary | ICD-10-CM | POA: Diagnosis not present

## 2018-06-20 DIAGNOSIS — Z7982 Long term (current) use of aspirin: Secondary | ICD-10-CM | POA: Insufficient documentation

## 2018-06-20 DIAGNOSIS — I110 Hypertensive heart disease with heart failure: Secondary | ICD-10-CM | POA: Diagnosis not present

## 2018-06-20 DIAGNOSIS — E892 Postprocedural hypoparathyroidism: Secondary | ICD-10-CM | POA: Diagnosis not present

## 2018-06-20 DIAGNOSIS — Z888 Allergy status to other drugs, medicaments and biological substances status: Secondary | ICD-10-CM | POA: Diagnosis not present

## 2018-06-20 DIAGNOSIS — Z91012 Allergy to eggs: Secondary | ICD-10-CM | POA: Diagnosis not present

## 2018-06-20 DIAGNOSIS — F419 Anxiety disorder, unspecified: Secondary | ICD-10-CM | POA: Insufficient documentation

## 2018-06-20 DIAGNOSIS — Z85828 Personal history of other malignant neoplasm of skin: Secondary | ICD-10-CM | POA: Insufficient documentation

## 2018-06-20 DIAGNOSIS — I10 Essential (primary) hypertension: Secondary | ICD-10-CM

## 2018-06-20 DIAGNOSIS — Z79899 Other long term (current) drug therapy: Secondary | ICD-10-CM | POA: Diagnosis not present

## 2018-06-20 DIAGNOSIS — I252 Old myocardial infarction: Secondary | ICD-10-CM | POA: Insufficient documentation

## 2018-06-20 DIAGNOSIS — Z86718 Personal history of other venous thrombosis and embolism: Secondary | ICD-10-CM | POA: Diagnosis not present

## 2018-06-20 DIAGNOSIS — Z7984 Long term (current) use of oral hypoglycemic drugs: Secondary | ICD-10-CM | POA: Insufficient documentation

## 2018-06-20 DIAGNOSIS — F319 Bipolar disorder, unspecified: Secondary | ICD-10-CM | POA: Diagnosis not present

## 2018-06-20 DIAGNOSIS — R0683 Snoring: Secondary | ICD-10-CM | POA: Insufficient documentation

## 2018-06-20 DIAGNOSIS — J45909 Unspecified asthma, uncomplicated: Secondary | ICD-10-CM | POA: Insufficient documentation

## 2018-06-20 DIAGNOSIS — R002 Palpitations: Secondary | ICD-10-CM | POA: Diagnosis not present

## 2018-06-20 DIAGNOSIS — Z7901 Long term (current) use of anticoagulants: Secondary | ICD-10-CM | POA: Diagnosis not present

## 2018-06-20 LAB — BASIC METABOLIC PANEL
Anion gap: 12 (ref 5–15)
BUN: 31 mg/dL — ABNORMAL HIGH (ref 8–23)
CO2: 27 mmol/L (ref 22–32)
Calcium: 9.4 mg/dL (ref 8.9–10.3)
Chloride: 102 mmol/L (ref 98–111)
Creatinine, Ser: 1.28 mg/dL — ABNORMAL HIGH (ref 0.44–1.00)
GFR calc Af Amer: 52 mL/min — ABNORMAL LOW (ref >60)
GFR calc non Af Amer: 45 mL/min — ABNORMAL LOW (ref >60)
Glucose, Bld: 128 mg/dL — ABNORMAL HIGH (ref 70–99)
Potassium: 4.7 mmol/L (ref 3.5–5.1)
Sodium: 141 mmol/L (ref 135–145)

## 2018-06-20 NOTE — Progress Notes (Signed)
Patient ID: Kathleen [name], female   DOB: 1955/10/29, 63 y.o.   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 Sleep study 11/2013 was negative. Also has history of A fib.    Had NSTEMI in 2/09 had cath at that time with occlusion of septal perforator. Otherwise normal arteries. Was admitted for CP in 11/13. CE normal. Underwent dobutamine stress echo which was normal.   Admitted 09/11/14 for Afib RVR. She converted to sinus tach with dilt drip and was transitioned to Toprol-XL 25 mg daily. She was in NSR at the time of discharge. Discharge weight was 325 lb.  Admitted 7/12-7/14/17 with chest pain. CTA negative for PE. Troponins negative.  Had Myoview 08/05/15 with "subtle" anterior wall defect. Reviewed by Dr. Haroldine [name] at visit 08/25/15, thought to possibly be breast attenuation. Echo stable as below.   Admitted 12/6-12/82017 with CP and dyspnea. Cardiac work up was negative. Discharge weight was 334 pounds.   Holter monitor in 7/19: NSR with occasional PVCs and PACs. No other abnormalities. Multiple diary events which occasionally correspond to PVCs but often during NSR.   Seen in July for palpitations. Holter monitor as above.   Here for routine follow-up. Feels pretty good. At last visit Jardiance started 10 mg/day. Tolerating well. Weighing every day. Weight up and down in 5 pound range. Takes lasix 80 daily and about 3x week takes extra 40 to keep weight stable. Can do ADls but has to take breaks. No orthopnea, PND.   Echo 7/19 EF 55-60% Grade 1 DD RVSP 68mmHG  Echo 11/13 EF 60-65% Mild LAE. RV normal. No PH. No comment on diastolic function.  Echo 09/04/13 EF 55-60%  Grade I DD Echo 08/05/15 EF 55-60%  Grade I DD  Labs:  09/25/13 K 4.1 Creatinine 1.0 , LDL 79 10/21/2014: K 5.4 Creatinine 0.95   02/22/2015: K 4.4 Creatinine 1.08  03/30/2016: K 4.3 Creatinine 1.09  07/10/2017: K 4.4 Creatinine 0.9 TSH 5.28  Allergies  Allergen Reactions  . Eggs Or Egg-Derived Products Hives and Other (See Comments)    Can eat foods with cooked eggs; CANNOT handle when part of flu vaccine, etc.   . Lamictal [Lamotrigine] Hives and Rash    Current Outpatient Medications  Medication Sig Dispense Refill  . albuterol (PROVENTIL HFA;VENTOLIN HFA) 108 (90 BASE) MCG/ACT inhaler Inhale 2 puffs into the lungs every 6 (six) hours as needed for wheezing. 1 Inhaler 1  . allopurinol (ZYLOPRIM) 100 MG tablet TAKE 200 MG BY MOUTH EVERY DAY 60 tablet 6  . benztropine (COGENTIN) 1 MG tablet Take 1 mg by mouth 2 (two) times daily.  2  . divalproex (DEPAKOTE ER) 500 MG 24 hr tablet Take 1,500 mg by mouth at bedtime.     . furosemide (LASIX) 80 MG tablet TAKE 1 TAB DAILY. MAY ALSO TAKE 1/2 TAB AS NEEDED FOR FLUID. 45 tablet 3  . JARDIANCE 10 MG TABS tablet TAKE ONE TABLET BY MOUTH DAILY 90 tablet 5  . LATUDA 60 MG TABS Take 60 mg by mouth at bedtime.     [name] lisinopril (ZESTRIL) 20 MG tablet Take 1 tablet (20 mg total) by mouth daily. 90 tablet 1  . metFORMIN (GLUCOPHAGE) 500 MG tablet TAKE ONE TABLET BY MOUTH TWICE A  DAY WITH MEALS 180 tablet 1  . metoprolol tartrate (LOPRESSOR) 25 MG tablet Take 1 tablet (25 mg total) by mouth 2 (two) times daily. 180 tablet 3  . nitroGLYCERIN (NITROSTAT) 0.4 MG SL tablet Place 1 tablet (0.4 mg total) under the tongue every 5 (five) minutes as needed for chest pain. For chest pain 25 tablet 3  . rosuvastatin (CRESTOR) 20 MG tablet TAKE ONE TABLET BY MOUTH DAILY 90 tablet 3  . triamcinolone ointment (KENALOG) 0.1 % APPLY TO AFFECTED AREA TWICE A DAY FOR 14 DAYS (Patient taking differently: Apply 1 application topically 2 (two) times daily as needed (for dry skin). ) 30 g 1  . UNABLE TO FIND Med Name: One Touch Verio blood glucose meter Lot #[id number] x Exp 01/22/2018    . XARELTO 20 MG  TABS tablet TAKE ONE TABLET BY MOUTH AT BEDTIME 30 tablet 4   No current facility-administered medications for this encounter.     Past Medical History:  Diagnosis Date  . Anemia   . Anxiety   . Asthma   . Atrial fibrillation ([place])   . Bipolar disorder ([place])   . Cancer ([place])    basal cell carcinoma on right hand, papilloma left breast  . CHF (congestive heart failure) ([place])    pt seen at heart/vascular spec clinic 08/14/2014   . Coarse tremors    hands  . Coronary artery disease   . Depression   . DVT (deep vein thrombosis) in pregnancy    pt. reports that it was post knee surgery, not during pregnancy  . Dyslipidemia   . Fall   . Fatty liver   . Headache    migraines - stopped after menopause  . Heart attack ([place]) 02/2007   non-q-wave with second septal perforator  . Hypertension   . Hypothyroidism    history of took medication, has resolved  . Knee pain, left   . Lower extremity deep venous thrombosis (HCC)    20 years ago   . Measles    hx of in childhood   . Morbid obesity with BMI of 40.0-44.9, adult ([place])   . Pain at surgical incision    Left breast from procedure 09/11/16  . Pneumonia    hx of   . PONV (postoperative nausea and vomiting)    also slow to wake up  . Psychiatric hospitalization 05/2008  . Shingles    20 years ago   . Shortness of breath dyspnea    exercise   . Type 2 diabetes mellitus ([place]) 12/31/2015  . Urinary incontinence     Past Surgical History:  Procedure Laterality Date  . BASAL CELL CARCINOMA EXCISION    . BREAST LUMPECTOMY WITH RADIOACTIVE SEED LOCALIZATION Left 09/11/2016   Procedure: LEFT BREAST LUMPECTOMY WITH RADIOACTIVE SEED LOCALIZATION;  Surgeon: Kathleen Seltzer, MD;  Location: [facility];  Service: General;  Laterality: Left;  . BREAST MASS EXCISION     age 24, benign tumor  . CARDIAC CATHETERIZATION    . CARDIAC CATHETERIZATION N/A 12/30/2015   Procedure: Left Heart Cath and Coronary Angiography;  Surgeon: Kathleen Crome, MD;   Location: [facility];  Service: Cardiovascular;  Laterality: N/A;  . COLONOSCOPY    . DILATATION & CURETTAGE/HYSTEROSCOPY WITH MYOSURE N/A 09/22/2016   Procedure: DILATATION & CURETTAGE/HYSTEROSCOPY WITH MYOSURE;  Surgeon: Molli Posey, MD;  Location: [facility];  Service: Gynecology;  Laterality: N/A;  . DILATION AND CURETTAGE OF UTERUS    . KNEE SURGERY  right, removed cartilage  . PARATHYROIDECTOMY N/A 08/25/2014   Procedure: PARATHYROIDECTOMY;  Surgeon: Armandina Gemma, MD;  Location: [facility];  Service: General;  Laterality: N/A;  . SHOULDER ARTHROSCOPY WITH ROTATOR CUFF REPAIR AND SUBACROMIAL DECOMPRESSION Right 02/27/2017   Procedure: Right shoulder arthroscopic rotator cuff repair with biceps tenodesis and subacromial decompression;  Surgeon: Nicholes Stairs, MD;  Location: [facility];  Service: Orthopedics;  Laterality: Right;  150 mins  . TONSILLECTOMY    . TUBAL LIGATION      ROS:  As stated in the HPI and negative for all other systems.  PHYSICAL EXAM BP 134/86   Pulse 86   Wt (!) 148.5 kg (327 lb 6.4 oz)   SpO2 95%   BMI 44.40 kg/m    Wt Readings from Last 3 Encounters:  06/20/18 (!) 148.5 kg (327 lb 6.4 oz)  04/08/18 (!) 147.4 kg (325 lb)  03/11/18 (!) 147.4 kg (325 lb)    General:  Sitting on exam table. No resp difficulty HEENT: normal Neck: supple. no JVD. Carotids 2+ bilat; no bruits. No lymphadenopathy or thryomegaly appreciated. Cor: PMI nondisplaced. Regular rate & rhythm. Soft SEM at RUSB  Occasional ectopy Lungs: clear Abdomen: obese soft, nontender, nondistended. No hepatosplenomegaly. No bruits or masses. Good bowel sounds. Extremities: no cyanosis, clubbing, rash, edema healed wounds  Neuro: alert & orientedx3, cranial nerves grossly intact. moves all 4 extremities w/o difficulty. Affect pleasant  - ECG today NSR 76bpm no ST-T wave abnormalities Personally reviewed  ASSESSMENT AND PLAN  1. Chronic diastolic HF:  -ECHO 02/6832 EF Normal EF 55-60%  Grade IDD  - Echo 7/19 EF 55-60% Personally reviewed - Volume status ok   2. PAF - CHADSVASC = 3. - In NSR. Continue bb and xarelto.   3. Morbid obesity: Body mass index is 44.4 kg/m.  4. CAD:  - No s/s ischemia - Continue ASA 81, Plavix, statin.    5. HTN:   - BP well controlled   6. Snoring:  - No OSA on sleep study.   7. Hyperparathyroidism-   - s/p partial parathyroidectomy 08/2014. Per PCP.   8. Palpitations - Holter monitor 7/19 with PACs and PVCs no sustained arryhythmias - Improved  9. DM2 - Continue Jardiance 10 mg daily     Glori Bickers MD 06/20/2018

## 2018-06-20 NOTE — Patient Instructions (Signed)
EKG was completed today.  Labs were done today. We will call you with any ABNORMAL results. No news is good news!  Your physician wants you to follow-up in: 6 MONTHS You will receive a reminder letter in the mail two months in advance. If you don't receive a letter, please call our office to schedule the follow-up appointment.

## 2018-06-20 NOTE — Progress Notes (Signed)
Patient ID: Kathleen [name], female   DOB: 26-Dec-1955, 63 y.o.   MRN: [id number]    Advanced Heart Failure Clinic Note   Primary HF MD: Dr [name] PCP: Dr Rodney Booze   HPI 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 Sleep study 11/2013 was negative. Also has history of A fib.    Had NSTEMI in 2/09 had cath at that time with occlusion of septal perforator. Otherwise normal arteries. Was admitted for CP in 11/13. CE normal. Underwent dobutamine stress echo which was normal.   Admitted 09/11/14 for Afib RVR. She converted to sinus tach with dilt drip and was transitioned to Toprol-XL 25 mg daily. She was in NSR at the time of discharge. Discharge weight was 325 lb.  Admitted 7/12-7/14/17 with chest pain. CTA negative for PE. Troponins negative.  Had Myoview 08/05/15 with "subtle" anterior wall defect. Reviewed by Dr. [name] at visit 08/25/15, thought to possibly be breast attenuation. Echo stable as below.   Admitted 12/6-12/82017 with CP and dyspnea. Cardiac work up was negative. Discharge weight was 334 pounds.   Holter monitor in 7/19: NSR with occasional PVCs and PACs. No other abnormalities. Multiple diary events which occasionally correspond to PVCs but often during NSR.   Seen in July for palpitations. Holter monitor as above.   Here for routine follow-up. Feels pretty good. At last visit Jardiance started 10 mg/day. Tolerating well. Weighing every day. Weight up and down in 5 pound range. Takes lasix 80 daily and about 3x week takes extra 40 to keep weight stable. Can do ADls but has to take breaks. No orthopnea, PND.   Echo 7/19 EF 55-60% Grade 1 DD RVSP 45mmHG  Echo 11/13 EF 60-65% Mild LAE. RV normal. No PH. No comment on diastolic function.  Echo 09/04/13 EF 55-60%  Grade I DD Echo 08/05/15 EF 55-60%  Grade I DD  Labs:  09/25/13 K 4.1 Creatinine 1.0 , LDL 79 10/21/2014: K 5.4 Creatinine 0.95   02/22/2015: K 4.4 Creatinine 1.08  03/30/2016: K 4.3 Creatinine 1.09  07/10/2017: K 4.4 Creatinine 0.9 TSH 5.28  Allergies  Allergen Reactions  . Eggs Or Egg-Derived Products Hives and Other (See Comments)    Can eat foods with cooked eggs; CANNOT handle when part of flu vaccine, etc.   . Lamictal [Lamotrigine] Hives and Rash    Current Outpatient Medications  Medication Sig Dispense Refill  . albuterol (PROVENTIL HFA;VENTOLIN HFA) 108 (90 BASE) MCG/ACT inhaler Inhale 2 puffs into the lungs every 6 (six) hours as needed for wheezing. 1 Inhaler 1  . allopurinol (ZYLOPRIM) 100 MG tablet TAKE 200 MG BY MOUTH EVERY DAY 60 tablet 6  . benztropine (COGENTIN) 1 MG tablet Take 1 mg by mouth 2 (two) times daily.  2  . divalproex (DEPAKOTE ER) 500 MG 24 hr tablet Take 1,500 mg by mouth at bedtime.     . furosemide (LASIX) 80 MG tablet TAKE 1 TAB DAILY. MAY ALSO TAKE 1/2 TAB AS NEEDED FOR FLUID. 45 tablet 3  . JARDIANCE 10 MG TABS tablet TAKE ONE TABLET BY MOUTH DAILY 90 tablet 5  . LATUDA 60 MG TABS Take 60 mg by mouth at bedtime.     [name] lisinopril (ZESTRIL) 20 MG tablet Take 1 tablet (20 mg total) by mouth daily. 90 tablet 1  . metFORMIN (GLUCOPHAGE) 500 MG tablet TAKE ONE TABLET BY MOUTH TWICE A  DAY WITH MEALS 180 tablet 1  . metoprolol tartrate (LOPRESSOR) 25 MG tablet Take 1 tablet (25 mg total) by mouth 2 (two) times daily. 180 tablet 3  . nitroGLYCERIN (NITROSTAT) 0.4 MG SL tablet Place 1 tablet (0.4 mg total) under the tongue every 5 (five) minutes as needed for chest pain. For chest pain 25 tablet 3  . rosuvastatin (CRESTOR) 20 MG tablet TAKE ONE TABLET BY MOUTH DAILY 90 tablet 3  . triamcinolone ointment (KENALOG) 0.1 % APPLY TO AFFECTED AREA TWICE A DAY FOR 14 DAYS (Patient taking differently: Apply 1 application topically 2 (two) times daily as needed (for dry skin). ) 30 g 1  . UNABLE TO FIND Med Name: One Touch Verio blood glucose meter Lot #[id number] x Exp 01/22/2018    . XARELTO 20 MG  TABS tablet TAKE ONE TABLET BY MOUTH AT BEDTIME 30 tablet 4   No current facility-administered medications for this encounter.     Past Medical History:  Diagnosis Date  . Anemia   . Anxiety   . Asthma   . Atrial fibrillation ([place])   . Bipolar disorder ([place])   . Cancer ([place])    basal cell carcinoma on right hand, papilloma left breast  . CHF (congestive heart failure) ([place])    pt seen at heart/vascular spec clinic 08/14/2014   . Coarse tremors    hands  . Coronary artery disease   . Depression   . DVT (deep vein thrombosis) in pregnancy    pt. reports that it was post knee surgery, not during pregnancy  . Dyslipidemia   . Fall   . Fatty liver   . Headache    migraines - stopped after menopause  . Heart attack ([place]) 02/2007   non-q-wave with second septal perforator  . Hypertension   . Hypothyroidism    history of took medication, has resolved  . Knee pain, left   . Lower extremity deep venous thrombosis (HCC)    20 years ago   . Measles    hx of in childhood   . Morbid obesity with BMI of 40.0-44.9, adult ([place])   . Pain at surgical incision    Left breast from procedure 09/11/16  . Pneumonia    hx of   . PONV (postoperative nausea and vomiting)    also slow to wake up  . Psychiatric hospitalization 05/2008  . Shingles    20 years ago   . Shortness of breath dyspnea    exercise   . Type 2 diabetes mellitus ([place]) 12/31/2015  . Urinary incontinence     Past Surgical History:  Procedure Laterality Date  . BASAL CELL CARCINOMA EXCISION    . BREAST LUMPECTOMY WITH RADIOACTIVE SEED LOCALIZATION Left 09/11/2016   Procedure: LEFT BREAST LUMPECTOMY WITH RADIOACTIVE SEED LOCALIZATION;  Surgeon: Excell Seltzer, MD;  Location: [facility];  Service: General;  Laterality: Left;  . BREAST MASS EXCISION     age 63, benign tumor  . CARDIAC CATHETERIZATION    . CARDIAC CATHETERIZATION N/A 12/30/2015   Procedure: Left Heart Cath and Coronary Angiography;  Surgeon: Belva Crome, MD;   Location: [facility];  Service: Cardiovascular;  Laterality: N/A;  . COLONOSCOPY    . DILATATION & CURETTAGE/HYSTEROSCOPY WITH MYOSURE N/A 09/22/2016   Procedure: DILATATION & CURETTAGE/HYSTEROSCOPY WITH MYOSURE;  Surgeon: Molli Posey, MD;  Location: [facility];  Service: Gynecology;  Laterality: N/A;  . DILATION AND CURETTAGE OF UTERUS    . KNEE SURGERY  right, removed cartilage  . PARATHYROIDECTOMY N/A 08/25/2014   Procedure: PARATHYROIDECTOMY;  Surgeon: Armandina Gemma, MD;  Location: [facility];  Service: General;  Laterality: N/A;  . SHOULDER ARTHROSCOPY WITH ROTATOR CUFF REPAIR AND SUBACROMIAL DECOMPRESSION Right 02/27/2017   Procedure: Right shoulder arthroscopic rotator cuff repair with biceps tenodesis and subacromial decompression;  Surgeon: Nicholes Stairs, MD;  Location: [facility];  Service: Orthopedics;  Laterality: Right;  150 mins  . TONSILLECTOMY    . TUBAL LIGATION      ROS:  As stated in the HPI and negative for all other systems.  PHYSICAL EXAM BP 134/86   Pulse 86   Wt (!) 148.5 kg (327 lb 6.4 oz)   SpO2 95%   BMI 44.40 kg/m    Wt Readings from Last 3 Encounters:  06/20/18 (!) 148.5 kg (327 lb 6.4 oz)  04/08/18 (!) 147.4 kg (325 lb)  03/11/18 (!) 147.4 kg (325 lb)    General:  Well appearing. No resp difficulty HEENT: normal Neck: supple. no JVD. Carotids 2+ bilat; no bruits. No lymphadenopathy or thryomegaly appreciated. Cor: PMI nondisplaced. Regular rate & rhythm. Soft SEM at RUSB  Occasional ectopy Lungs: clear Abdomen: obese soft, nontender, nondistended. No hepatosplenomegaly. No bruits or masses. Good bowel sounds. Extremities: no cyanosis, clubbing, rash, edema healed wounds  Neuro: alert & orientedx3, cranial nerves grossly intact. moves all 4 extremities w/o difficulty. Affect pleasant   ASSESSMENT AND PLAN  1. Chronic diastolic HF:  -ECHO 05/352 EF Normal EF 55-60% Grade IDD  - Echo 7/19 EF 55-60% Personally reviewed - Volume status ok    2. PAF - CHADSVASC = 3. - In NSR. Continue bb and xarelto.   3. Morbid obesity: Body mass index is 44.4 kg/m.  4. CAD:  - No s/s ischemia - Continue ASA 81, Plavix, statin.    5. HTN:   - BP remains elevated. Will add Jardiance and see if improves.  - Can add spiro 12.5 down the road if needed  6. Snoring:  - No OSA on sleep study.   7. Hyperparathyroidism-   - s/p partial parathyroidectomy 08/2014. Per PCP.   8. Palpitations - Holter monitor 7/19 with PACs and PVCs no sustained arryhythmias - Improved  9. DM2 - Continue Jardiance 10 mg daily     Glori Bickers MD 06/20/2018

## 2018-06-20 NOTE — Progress Notes (Signed)
Patient ID: Kathleen [name], female   DOB: [birth year], 63 y.o.   MRN: [id number]    Advanced Heart Failure Clinic Note   Primary HF MD: Dr Kathleen [name] PCP: Dr Kathleen [name]   HPI 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 Sleep study 11/2013 was negative. Also has history of A fib.    Had NSTEMI in 2/09 had cath at that time with occlusion of septal perforator. Otherwise normal arteries. Was admitted for CP in 63/13. CE normal. Underwent dobutamine stress echo which was normal.   Admitted 09/11/14 for Afib RVR. She converted to sinus tach with dilt drip and was transitioned to Toprol-XL 25 mg daily. She was in NSR at the time of discharge. Discharge weight was 325 lb.  Admitted 7/12-7/14/17 with chest pain. CTA negative for PE. Troponins negative.  Had Myoview 08/05/15 with "subtle" anterior wall defect. Reviewed by Dr. Haroldine [name] at visit 08/25/15, thought to possibly be breast attenuation. Echo stable as below.   Admitted 63/6-12/82017 with CP and dyspnea. Cardiac work up was negative. Discharge weight was 334 pounds.   Holter monitor in 7/19: NSR with occasional PVCs and PACs. No other abnormalities. Multiple diary events which occasionally correspond to PVCs but often during NSR.   Seen in July for palpitations. Holter monitor as above.   Here for routine follow-up. Feels pretty good. At last visit Jardiance started 10 mg/day. Tolerating well. Weighing every day. Weight up and down in 5 pound range. Takes lasix 80 daily and about 3x week takes extra 40 to keep weight stable. Can do ADls but has to take breaks. No orthopnea, PND.   Echo 7/19 EF 55-60% Grade 1 DD RVSP 23mmHG  Echo 11/13 EF 60-65% Mild LAE. RV normal. No PH. No comment on diastolic function.  Echo 09/04/13 EF 55-60%  Grade I DD Echo 08/05/15 EF 55-60%  Grade I DD  Labs:  09/25/13 K 4.1 Creatinine 1.0 , LDL 79 10/21/2014: K 5.4 Creatinine 0.95   02/22/2015: K 4.4 Creatinine 1.08  03/30/2016: K 4.3 Creatinine 1.09  07/10/2017: K 4.4 Creatinine 0.9 TSH 5.28  Allergies  Allergen Reactions  . Eggs Or Egg-Derived Products Hives and Other (See Comments)    Can eat foods with cooked eggs; CANNOT handle when part of flu vaccine, etc.   . Lamictal [Lamotrigine] Hives and Rash    Current Outpatient Medications  Medication Sig Dispense Refill  . albuterol (PROVENTIL HFA;VENTOLIN HFA) 108 (90 BASE) MCG/ACT inhaler Inhale 2 puffs into the lungs every 6 (six) hours as needed for wheezing. 1 Inhaler 1  . allopurinol (ZYLOPRIM) 100 MG tablet TAKE 200 MG BY MOUTH EVERY DAY 60 tablet 6  . benztropine (COGENTIN) 1 MG tablet Take 1 mg by mouth 2 (two) times daily.  2  . divalproex (DEPAKOTE ER) 500 MG 24 hr tablet Take 1,500 mg by mouth at bedtime.     . furosemide (LASIX) 80 MG tablet TAKE 1 TAB DAILY. MAY ALSO TAKE 1/2 TAB AS NEEDED FOR FLUID. 45 tablet 3  . JARDIANCE 10 MG TABS tablet TAKE ONE TABLET BY MOUTH DAILY 90 tablet 5  . LATUDA 60 MG TABS Take 60 mg by mouth at bedtime.     [name] lisinopril (ZESTRIL) 20 MG tablet Take 1 tablet (20 mg total) by mouth daily. 90 tablet 1  . metFORMIN (GLUCOPHAGE) 500 MG tablet TAKE ONE TABLET BY MOUTH TWICE A  DAY WITH MEALS 180 tablet 1  . metoprolol tartrate (LOPRESSOR) 25 MG tablet Take 1 tablet (25 mg total) by mouth 2 (two) times daily. 180 tablet 3  . nitroGLYCERIN (NITROSTAT) 0.4 MG SL tablet Place 1 tablet (0.4 mg total) under the tongue every 5 (five) minutes as needed for chest pain. For chest pain 25 tablet 3  . rosuvastatin (CRESTOR) 20 MG tablet TAKE ONE TABLET BY MOUTH DAILY 90 tablet 3  . triamcinolone ointment (KENALOG) 0.1 % APPLY TO AFFECTED AREA TWICE A DAY FOR 14 DAYS (Patient taking differently: Apply 1 application topically 2 (two) times daily as needed (for dry skin). ) 30 g 1  . UNABLE TO FIND Med Name: One Touch Verio blood glucose meter Lot #[id number] x Exp 01/22/2018    . XARELTO 20 MG  TABS tablet TAKE ONE TABLET BY MOUTH AT BEDTIME 30 tablet 4   No current facility-administered medications for this encounter.     Past Medical History:  Diagnosis Date  . Anemia   . Anxiety   . Asthma   . Atrial fibrillation ([place])   . Bipolar disorder ([place])   . Cancer ([place])    basal cell carcinoma on right hand, papilloma left breast  . CHF (congestive heart failure) ([place])    pt seen at heart/vascular spec clinic 08/14/2014   . Coarse tremors    hands  . Coronary artery disease   . Depression   . DVT (deep vein thrombosis) in pregnancy    pt. reports that it was post knee surgery, not during pregnancy  . Dyslipidemia   . Fall   . Fatty liver   . Headache    migraines - stopped after menopause  . Heart attack ([place]) 02/2007   non-q-wave with second septal perforator  . Hypertension   . Hypothyroidism    history of took medication, has resolved  . Knee pain, left   . Lower extremity deep venous thrombosis (HCC)    20 years ago   . Measles    hx of in childhood   . Morbid obesity with BMI of 40.0-44.9, adult ([place])   . Pain at surgical incision    Left breast from procedure 09/11/16  . Pneumonia    hx of   . PONV (postoperative nausea and vomiting)    also slow to wake up  . Psychiatric hospitalization 05/2008  . Shingles    20 years ago   . Shortness of breath dyspnea    exercise   . Type 2 diabetes mellitus ([place]) 12/31/2015  . Urinary incontinence     Past Surgical History:  Procedure Laterality Date  . BASAL CELL CARCINOMA EXCISION    . BREAST LUMPECTOMY WITH RADIOACTIVE SEED LOCALIZATION Left 09/11/2016   Procedure: LEFT BREAST LUMPECTOMY WITH RADIOACTIVE SEED LOCALIZATION;  Surgeon: Kathleen Seltzer, MD;  Location: [facility];  Service: General;  Laterality: Left;  . BREAST MASS EXCISION     age 58, benign tumor  . CARDIAC CATHETERIZATION    . CARDIAC CATHETERIZATION N/A 12/30/2015   Procedure: Left Heart Cath and Coronary Angiography;  Surgeon: Kathleen Crome, MD;   Location: [facility];  Service: Cardiovascular;  Laterality: N/A;  . COLONOSCOPY    . DILATATION & CURETTAGE/HYSTEROSCOPY WITH MYOSURE N/A 09/22/2016   Procedure: DILATATION & CURETTAGE/HYSTEROSCOPY WITH MYOSURE;  Surgeon: Molli Posey, MD;  Location: [facility];  Service: Gynecology;  Laterality: N/A;  . DILATION AND CURETTAGE OF UTERUS    . KNEE SURGERY  right, removed cartilage  . PARATHYROIDECTOMY N/A 08/25/2014   Procedure: PARATHYROIDECTOMY;  Surgeon: Armandina Gemma, MD;  [facility];  Service: General;  Laterality: N/A;  . SHOULDER ARTHROSCOPY WITH ROTATOR CUFF REPAIR AND SUBACROMIAL DECOMPRESSION Right 02/27/2017   Procedure: Right shoulder arthroscopic rotator cuff repair with biceps tenodesis and subacromial decompression;  Surgeon: Nicholes Stairs, MD;  Location: [facility];  Service: Orthopedics;  Laterality: Right;  150 mins  . TONSILLECTOMY    . TUBAL LIGATION      ROS:  As stated in the HPI and negative for all other systems.  PHYSICAL EXAM BP 99/66   Pulse 86   Wt (!) 148.5 kg (327 lb 6.4 oz)   SpO2 95%   BMI 44.40 kg/m    Wt Readings from Last 3 Encounters:  06/20/18 (!) 148.5 kg (327 lb 6.4 oz)  04/08/18 (!) 147.4 kg (325 lb)  03/11/18 (!) 147.4 kg (325 lb)    General:  Well appearing. No resp difficulty HEENT: normal Neck: supple. no JVD. Carotids 2+ bilat; no bruits. No lymphadenopathy or thryomegaly appreciated. Cor: PMI nondisplaced. Regular rate & rhythm. Soft SEM at RUSB  Occasional ectopy Lungs: clear Abdomen: obese soft, nontender, nondistended. No hepatosplenomegaly. No bruits or masses. Good bowel sounds. Extremities: no cyanosis, clubbing, rash, edema healed wounds  Neuro: alert & orientedx3, cranial nerves grossly intact. moves all 4 extremities w/o difficulty. Affect pleasant   ASSESSMENT AND PLAN  1. Chronic diastolic HF:  -ECHO 01/2749 EF Normal EF 55-60% Grade IDD  - Echo 7/19 EF 55-60% Personally reviewed - Volume status ok    2. PAF - CHADSVASC = 3. - In NSR. Continue bb and xarelto.   3. Morbid obesity: Body mass index is 44.4 kg/m.  4. CAD:  - No s/s ischemia - Continue ASA 81, Plavix, statin.    5. HTN:   - BP remains elevated. Will add Jardiance and see if improves.  - Can add spiro 12.5 down the road if needed  6. Snoring:  - No OSA on sleep study.   7. Hyperparathyroidism-   - s/p partial parathyroidectomy 08/2014. Per PCP.   8. Palpitations - Holter monitor 7/19 with PACs and PVCs no sustained arryhythmias - Improved  9. DM2 - Continue Jardiance 10 mg daily     Glori Bickers MD 06/20/2018

## 2018-06-24 ENCOUNTER — Encounter (HOSPITAL_COMMUNITY): Payer: Self-pay | Admitting: Internal Medicine

## 2018-07-01 ENCOUNTER — Other Ambulatory Visit: Payer: Self-pay | Admitting: Obstetrics and Gynecology

## 2018-07-01 DIAGNOSIS — Z1231 Encounter for screening mammogram for malignant neoplasm of breast: Secondary | ICD-10-CM

## 2018-07-06 NOTE — Progress Notes (Addendum)
[facility] [place], [place], [place] [address] [phone]  Date:  07/08/2018   Name:  Kathleen [name]   DOB:  1955-09-01   MRN:  [id number]  PCP:  Darreld Mclean, MD    Chief Complaint: Diabetes (6 month follow up) and Immunizations (2nd shingrix vaccine)   History of Present Illness:  Kathleen [name] is a 63 y.o. very pleasant female patient who presents with the following:  Periodic follow up visit and 2nd shingrix today She has history of DM, CHF, a fib, hyperparathyroidism, HTN She has noted vertigo the last couple of days- she has used some meclizine that she had on hand and is improved currently  She has been mostly staying home since the pandemic - she has not been sick at all   Per recent advanced CHF clinic note 5/28: ASSESSMENT AND PLAN 1. Chronic diastolic HF:  -ECHO 03/1515 EF Normal EF 55-60% Grade IDD  - Echo 7/19 EF 55-60% Personally reviewed - Volume status ok  2. PAF - CHADSVASC = 3. - In NSR. Continue bb and xarelto. 3. Morbid obesity: Body mass index is 44.4 kg/m. 4. CAD:  - No s/s ischemia - Continue ASA 81, Plavix, statin. 5. HTN:   - BP remains elevated. Will add Jardiance and see if improves.  - Can add spiro 12.5 down the road if needed 6. Snoring:  - No OSA on sleep study.  7. Hyperparathyroidism-   - s/p partial parathyroidectomy 08/2014. Per PCP.  8. Palpitations - Holter monitor 7/19 with PACs and PVCs no sustained arryhythmias - Improved 9. DM2 - Continue Jardiance 10 mg daily   Need HIV screening A1c can be done She needs extra lasix a couple of times a week- when her weight goes up she will take an extra 40 mg  She has some questions about recent renal function.  Looking back in her chart, her BUN and creatinine had been a bit elevated, and GFR has gone down since February of this year.  This is likely related to diuretic use, but we do need to watch her renal  function carefully.  If she gets any worse we will need to stop metformin.  Kathleen [name] does not wish to see a nephrologist as yet, we will continue to monitor closely Lab Results  Component Value Date   HGBA1C 6.3 03/11/2018   mammo is scheduled   She has noted symptoms of ingrown toenails in both of her big toes.  This is worse than the left.  Patient Active Problem List   Diagnosis Date Noted  . Complete rotator cuff tear or rupture of right shoulder, not specified as traumatic 02/27/2017  . Auditory hallucination 12/13/2016  . Tremor of both hands 11/20/2016  . Gout 06/29/2016  . Type 2 diabetes mellitus ([place]) 12/31/2015  . Chest pain with moderate risk for cardiac etiology 12/29/2015  . Coronary artery dissection   . Pure hypercholesterolemia   . Lung nodule seen on imaging study 09/24/2014  . Paroxysmal atrial fibrillation ([place]) 09/11/2014  . Asthmatic bronchitis with exacerbation 05/25/2014  . Primary hyperparathyroidism ([place]) 04/15/2014  . Palpitations 03/20/2014  . Radicular low back pain 11/24/2013  . Chronic diastolic heart failure ([place]) 10/02/2013  . Shortness of breath 08/31/2013  . Venous stasis dermatitis of both lower extremities 08/19/2013  . Peripheral edema 06/12/2013  . Other malaise and fatigue 06/12/2013  . Nocturia 06/12/2013  . Cellulitis of leg,  right 10/11/2012  . Morbid obesity ([place]) 08/09/2012  . Urinary incontinence, urge 07/11/2012  . CAD (coronary artery disease) 10/31/2010  . NEOPLASM OF UNCERTAIN BEHAVIOR OF SKIN 02/11/2010  . Bipolar disorder ([place]) 09/14/2009  . ACUT MYOCARD INFARCT UNS SITE SUBSQT EPIS CARE 07/02/2008  . EDEMA 06/23/2008  . Hyperlipidemia 05/23/2008  . UNSPECIFIED DISORDER OF THYROID 02/04/2008  . Obesity 02/04/2008  . HYPERTENSION, BENIGN ESSENTIAL 02/04/2008  . MYOCARDIAL INFARCTION, HX OF 03/05/2007    Past Medical History:  Diagnosis Date  . Anemia   . Anxiety   . Asthma   . Atrial fibrillation ([place])   . Bipolar  disorder ([place])   . Cancer ([place])    basal cell carcinoma on right hand, papilloma left breast  . CHF (congestive heart failure) ([place])    pt seen at heart/vascular spec clinic 08/14/2014   . Coarse tremors    hands  . Coronary artery disease   . Depression   . DVT (deep vein thrombosis) in pregnancy    pt. reports that it was post knee surgery, not during pregnancy  . Dyslipidemia   . Fall   . Fatty liver   . Headache    migraines - stopped after menopause  . Heart attack ([place]) 02/2007   non-q-wave with second septal perforator  . Hypertension   . Hypothyroidism    history of took medication, has resolved  . Knee pain, left   . Lower extremity deep venous thrombosis (HCC)    20 years ago   . Measles    hx of in childhood   . Morbid obesity with BMI of 40.0-44.9, adult ([place])   . Pain at surgical incision    Left breast from procedure 09/11/16  . Pneumonia    hx of   . PONV (postoperative nausea and vomiting)    also slow to wake up  . Psychiatric hospitalization 05/2008  . Shingles    20 years ago   . Shortness of breath dyspnea    exercise   . Type 2 diabetes mellitus ([place]) 12/31/2015  . Urinary incontinence     Past Surgical History:  Procedure Laterality Date  . BASAL CELL CARCINOMA EXCISION    . BREAST LUMPECTOMY WITH RADIOACTIVE SEED LOCALIZATION Left 09/11/2016   Procedure: LEFT BREAST LUMPECTOMY WITH RADIOACTIVE SEED LOCALIZATION;  Surgeon: Excell Seltzer, MD;  Location: [facility];  Service: General;  Laterality: Left;  . BREAST MASS EXCISION     age 38, benign tumor  . CARDIAC CATHETERIZATION    . CARDIAC CATHETERIZATION N/A 12/30/2015   Procedure: Left Heart Cath and Coronary Angiography;  Surgeon: Belva Crome, MD;  Location: [facility];  Service: Cardiovascular;  Laterality: N/A;  . COLONOSCOPY    . DILATATION & CURETTAGE/HYSTEROSCOPY WITH MYOSURE N/A 09/22/2016   Procedure: DILATATION & CURETTAGE/HYSTEROSCOPY WITH MYOSURE;  Surgeon: Molli Posey,  MD;  Location: [name];  Service: Gynecology;  Laterality: N/A;  . DILATION AND CURETTAGE OF UTERUS    . KNEE SURGERY     right, removed cartilage  . PARATHYROIDECTOMY N/A 08/25/2014   Procedure: PARATHYROIDECTOMY;  Surgeon: Armandina Gemma, MD;  Location: [facility];  Service: General;  Laterality: N/A;  . SHOULDER ARTHROSCOPY WITH ROTATOR CUFF REPAIR AND SUBACROMIAL DECOMPRESSION Right 02/27/2017   Procedure: Right shoulder arthroscopic rotator cuff repair with biceps tenodesis and subacromial decompression;  Surgeon: Nicholes Stairs, MD;  Location: [facility];  Service: Orthopedics;  Laterality: Right;  150 mins  . TONSILLECTOMY    .  TUBAL LIGATION      Social History   Tobacco Use  . Smoking status: Never Smoker  . Smokeless tobacco: Never Used  Substance Use Topics  . Alcohol use: Yes    Alcohol/week: 0.0 standard drinks    Comment: occ.  . Drug use: No    Family History  Problem Relation Age of Onset  . Breast cancer Mother 45  . Alcohol abuse Mother   . Alzheimer's disease Mother   . Alcohol abuse Father   . Alzheimer's disease Father   . Parkinson's disease Father   . Alcohol abuse Maternal Grandfather   . Drug abuse Maternal Grandfather   . Alcohol abuse Maternal Grandmother   . Alcohol abuse Paternal Grandfather   . Alcohol abuse Paternal Grandmother   . Schizophrenia Cousin   . Diabetes Brother   . Hypertension Brother     Allergies  Allergen Reactions  . Eggs Or Egg-Derived Products Hives and Other (See Comments)    Can eat foods with cooked eggs; CANNOT handle when part of flu vaccine, etc.   . Lamictal [Lamotrigine] Hives and Rash    Medication list has been reviewed and updated.  Current Outpatient Medications on File Prior to Visit  Medication Sig Dispense Refill  . albuterol (PROVENTIL HFA;VENTOLIN HFA) 108 (90 BASE) MCG/ACT inhaler Inhale 2 puffs into the lungs every 6 (six) hours as needed for wheezing. 1 Inhaler 1  . allopurinol (ZYLOPRIM) 100 MG tablet  TAKE 200 MG BY MOUTH EVERY DAY 60 tablet 6  . benztropine (COGENTIN) 1 MG tablet Take 1 mg by mouth 2 (two) times daily.  2  . divalproex (DEPAKOTE ER) 500 MG 24 hr tablet Take 1,500 mg by mouth at bedtime.     . furosemide (LASIX) 80 MG tablet TAKE 1 TAB DAILY. MAY ALSO TAKE 1/2 TAB AS NEEDED FOR FLUID. 45 tablet 3  . JARDIANCE 10 MG TABS tablet TAKE ONE TABLET BY MOUTH DAILY 90 tablet 5  . LATUDA 60 MG TABS Take 60 mg by mouth at bedtime.     [name] lisinopril (ZESTRIL) 20 MG tablet Take 1 tablet (20 mg total) by mouth daily. 90 tablet 1  . metFORMIN (GLUCOPHAGE) 500 MG tablet TAKE ONE TABLET BY MOUTH TWICE A DAY WITH MEALS 180 tablet 1  . metoprolol tartrate (LOPRESSOR) 25 MG tablet Take 1 tablet (25 mg total) by mouth 2 (two) times daily. 180 tablet 3  . nitroGLYCERIN (NITROSTAT) 0.4 MG SL tablet Place 1 tablet (0.4 mg total) under the tongue every 5 (five) minutes as needed for chest pain. For chest pain 25 tablet 3  . rosuvastatin (CRESTOR) 20 MG tablet TAKE ONE TABLET BY MOUTH DAILY 90 tablet 3  . triamcinolone ointment (KENALOG) 0.1 % APPLY TO AFFECTED AREA TWICE A DAY FOR 14 DAYS (Patient taking differently: Apply 1 application topically 2 (two) times daily as needed (for dry skin). ) 30 g 1  . UNABLE TO FIND Med Name: One Touch Verio blood glucose meter Lot #[id number] x Exp 01/22/2018    . XARELTO 20 MG TABS tablet TAKE ONE TABLET BY MOUTH AT BEDTIME 30 tablet 4   No current facility-administered medications on file prior to visit.     Review of Systems:  As per HPI- otherwise negative.   Physical Examination: Vitals:   07/08/18 0941  BP: 126/80  Pulse: 79  Resp: 18  Temp: 98.3 F (36.8 C)  SpO2: 97%   Vitals:   07/08/18 0941  Weight: (!) 327 lb (  148.3 kg)  Height: 6' (1.829 m)   Body mass index is 44.35 kg/m. Ideal Body Weight: Weight in (lb) to have BMI = 25: 183.9  GEN: WDWN, NAD, Non-toxic, A & O x 3, obese, looks well  HEENT: Atraumatic, Normocephalic. Neck  supple. No masses, No LAD. Ears and Nose: No external deformity. CV: RRR, No M/G/R. No JVD. No thrill. No extra heart sounds. PULM: CTA B, no wheezes, crackles, rhonchi. No retractions. No resp. distress. No accessory muscle use. EXTR: No c/c/no edema noted today NEURO Normal gait.  PSYCH: Normally interactive. Conversant. Not depressed or anxious appearing.  Calm demeanor.  The medial aspect of the left great toenail is ingrown and tender.  Does not appear to be infected  Wt Readings from Last 3 Encounters:  07/08/18 (!) 327 lb (148.3 kg)  06/20/18 (!) 327 lb 6.4 oz (148.5 kg)  04/08/18 (!) 325 lb (147.4 kg)   She considers 323 to be her dry weight She was 327 this am at home Assessment and Plan:   ICD-10-CM   1. Type 2 diabetes mellitus without complication, unspecified whether long term insulin use (HCC)  E11.9 Comprehensive metabolic panel    Hemoglobin A1c  2. Coronary artery disease involving native coronary artery of native heart without angina pectoris  I25.10   3. HYPERTENSION, BENIGN ESSENTIAL  I10   4. Primary hyperparathyroidism (HCC)  E21.0 PTH, intact (no Ca)  5. Screening for HIV (human immunodeficiency virus)  Z11.4 HIV Antibody (routine testing w rflx)  6. Immunization due  Z23 Varicella-zoster vaccine IM (Shingrix)  7. Ingrown toenail  L60.0 Ambulatory referral to Podiatry     Follow-up: No follow-ups on file.  No orders of the defined types were placed in this encounter.  Orders Placed This Encounter  Procedures  . Varicella-zoster vaccine IM (Shingrix)  . Comprehensive metabolic panel  . HIV Antibody (routine testing w rflx)  . Hemoglobin A1c  . PTH, intact (no Ca)  . Ambulatory referral to Podiatry     Following up today for routine visit.  A1c pending.  With recent decrease in renal function, may need to stop metformin.  Will monitor closely Blood pressure under good control Referral to podiatry to manage her toenail issue Second Shingrix  today Reminded her to alert her cardiologist if her weight does not return to normal with increased dose of Lasix Check PTH today Routine HIV screening  Signed Lamar Blinks, MD  6/16- received her labs as below  Results for orders placed or performed in visit on 07/08/18  Comprehensive metabolic panel  Result Value Ref Range   Sodium 141 135 - 145 mEq/L   Potassium 4.4 3.5 - 5.1 mEq/L   Chloride 99 96 - 112 mEq/L   CO2 30 19 - 32 mEq/L   Glucose, Bld 128 (H) 70 - 99 mg/dL   BUN 24 (H) 6 - 23 mg/dL   Creatinine, Ser 1.28 (H) 0.40 - 1.20 mg/dL   Total Bilirubin 0.5 0.2 - 1.2 mg/dL   Alkaline Phosphatase 108 39 - 117 U/L   AST 16 0 - 37 U/L   ALT 28 0 - 35 U/L   Total Protein 6.5 6.0 - 8.3 g/dL   Albumin 4.4 3.5 - 5.2 g/dL   Calcium 9.4 8.4 - 10.5 mg/dL   GFR 42.15 (L) >60.00 mL/min  HIV Antibody (routine testing w rflx)  Result Value Ref Range   HIV 1&2 Ab, 4th Generation NON-REACTIVE NON-REACTI  Hemoglobin A1c  Result Value Ref  Range   Hgb A1c MFr Bld 6.9 (H) 4.6 - 6.5 %  PTH, intact (no Ca)  Result Value Ref Range   PTH 127 (H) 14 - 64 pg/mL

## 2018-07-08 ENCOUNTER — Encounter: Payer: Self-pay | Admitting: Family Medicine

## 2018-07-08 ENCOUNTER — Other Ambulatory Visit: Payer: Self-pay

## 2018-07-08 ENCOUNTER — Ambulatory Visit: Payer: BC Managed Care – PPO | Admitting: Family Medicine

## 2018-07-08 VITALS — BP 126/80 | HR 79 | Temp 98.3°F | Resp 18 | Ht 72.0 in | Wt 327.0 lb

## 2018-07-08 DIAGNOSIS — Z23 Encounter for immunization: Secondary | ICD-10-CM | POA: Diagnosis not present

## 2018-07-08 DIAGNOSIS — E210 Primary hyperparathyroidism: Secondary | ICD-10-CM

## 2018-07-08 DIAGNOSIS — E119 Type 2 diabetes mellitus without complications: Secondary | ICD-10-CM | POA: Diagnosis not present

## 2018-07-08 DIAGNOSIS — L600 Ingrowing nail: Secondary | ICD-10-CM

## 2018-07-08 DIAGNOSIS — I2510 Atherosclerotic heart disease of native coronary artery without angina pectoris: Secondary | ICD-10-CM | POA: Diagnosis not present

## 2018-07-08 DIAGNOSIS — Z114 Encounter for screening for human immunodeficiency virus [HIV]: Secondary | ICD-10-CM

## 2018-07-08 DIAGNOSIS — I10 Essential (primary) hypertension: Secondary | ICD-10-CM | POA: Diagnosis not present

## 2018-07-08 LAB — HEMOGLOBIN A1C: Hgb A1c MFr Bld: 6.9 % — ABNORMAL HIGH (ref 4.6–6.5)

## 2018-07-08 LAB — COMPREHENSIVE METABOLIC PANEL
ALT: 28 U/L (ref 0–35)
AST: 16 U/L (ref 0–37)
Albumin: 4.4 g/dL (ref 3.5–5.2)
Alkaline Phosphatase: 108 U/L (ref 39–117)
BUN: 24 mg/dL — ABNORMAL HIGH (ref 6–23)
CO2: 30 mEq/L (ref 19–32)
Calcium: 9.4 mg/dL (ref 8.4–10.5)
Chloride: 99 mEq/L (ref 96–112)
Creatinine, Ser: 1.28 mg/dL — ABNORMAL HIGH (ref 0.40–1.20)
GFR: 42.15 mL/min — ABNORMAL LOW (ref >60.00)
Glucose, Bld: 128 mg/dL — ABNORMAL HIGH (ref 70–99)
Potassium: 4.4 mEq/L (ref 3.5–5.1)
Sodium: 141 mEq/L (ref 135–145)
Total Bilirubin: 0.5 mg/dL (ref 0.2–1.2)
Total Protein: 6.5 g/dL (ref 6.0–8.3)

## 2018-07-08 NOTE — Patient Instructions (Signed)
Great to see you today- take care and I will be in touch with your labs as soon as possible. As we discussed, your kidney function has decreased in order last year.  This may be related to your use of fluid pills, which is necessary to your heart failure.  At this time we will continue to monitor your kidneys closely.  If they get worse I will refer you to a nephrology specialist.  If your kidney function goes any lower, we will need to stop metformin.  This is okay since your blood sugar has been under good control.  Assuming all is well, with plan to check back in 6 months

## 2018-07-09 ENCOUNTER — Encounter: Payer: Self-pay | Admitting: Family Medicine

## 2018-07-09 LAB — PARATHYROID HORMONE, INTACT (NO CA): PTH: 127 pg/mL — ABNORMAL HIGH (ref 14–64)

## 2018-07-09 LAB — HIV ANTIBODY (ROUTINE TESTING W REFLEX): HIV 1&2 Ab, 4th Generation: NONREACTIVE

## 2018-07-09 NOTE — Addendum Note (Signed)
Addended by: Lamar Blinks C on: 07/09/2018 02:43 PM   Modules accepted: Orders

## 2018-07-10 ENCOUNTER — Encounter: Payer: Self-pay | Admitting: Family Medicine

## 2018-07-19 ENCOUNTER — Other Ambulatory Visit: Payer: Self-pay

## 2018-07-22 DIAGNOSIS — E559 Vitamin D deficiency, unspecified: Secondary | ICD-10-CM | POA: Insufficient documentation
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
## 2018-07-23 ENCOUNTER — Ambulatory Visit (INDEPENDENT_AMBULATORY_CARE_PROVIDER_SITE_OTHER): Payer: BC Managed Care – PPO | Admitting: Internal Medicine

## 2018-07-23 ENCOUNTER — Encounter: Payer: Self-pay | Admitting: Internal Medicine

## 2018-07-23 ENCOUNTER — Other Ambulatory Visit: Payer: Self-pay

## 2018-07-23 VITALS — BP 120/70 | HR 80 | Ht 72.0 in | Wt 324.0 lb

## 2018-07-23 DIAGNOSIS — E210 Primary hyperparathyroidism: Secondary | ICD-10-CM

## 2018-07-23 DIAGNOSIS — E559 Vitamin D deficiency, unspecified: Secondary | ICD-10-CM | POA: Diagnosis not present

## 2018-07-23 LAB — VITAMIN D 25 HYDROXY (VIT D DEFICIENCY, FRACTURES): VITD: 35.68 ng/mL (ref 30.00–100.00)

## 2018-07-23 NOTE — Patient Instructions (Signed)
Please stop at the lab.  Please come back for a follow-up appointment in 6 months.  

## 2018-07-25 ENCOUNTER — Encounter: Payer: Self-pay | Admitting: Internal Medicine

## 2018-07-31 ENCOUNTER — Other Ambulatory Visit (INDEPENDENT_AMBULATORY_CARE_PROVIDER_SITE_OTHER): Payer: BC Managed Care – PPO

## 2018-07-31 ENCOUNTER — Other Ambulatory Visit: Payer: Self-pay

## 2018-07-31 DIAGNOSIS — E210 Primary hyperparathyroidism: Secondary | ICD-10-CM | POA: Diagnosis not present

## 2018-07-31 LAB — PHOSPHORUS: Phosphorus: 4.4 mg/dL (ref 2.3–4.6)

## 2018-08-01 LAB — PARATHYROID HORMONE, INTACT (NO CA): PTH: 68 pg/mL — ABNORMAL HIGH (ref 15–65)

## 2018-08-05 ENCOUNTER — Other Ambulatory Visit: Payer: BC Managed Care – PPO

## 2018-08-05 ENCOUNTER — Other Ambulatory Visit: Payer: Self-pay

## 2018-08-05 DIAGNOSIS — E210 Primary hyperparathyroidism: Secondary | ICD-10-CM

## 2018-08-05 LAB — BASIC METABOLIC PANEL WITH GFR
BUN/Creatinine Ratio: 19 (calc) (ref 6–22)
BUN: 31 mg/dL — ABNORMAL HIGH (ref 7–25)
CO2: 30 mmol/L (ref 20–32)
Calcium: 9.2 mg/dL (ref 8.6–10.4)
Chloride: 99 mmol/L (ref 98–110)
Creat: 1.64 mg/dL — ABNORMAL HIGH (ref 0.50–0.99)
GFR, Est African American: 38 mL/min/{1.73_m2} — ABNORMAL LOW (ref >=60)
GFR, Est Non African American: 33 mL/min/{1.73_m2} — ABNORMAL LOW (ref >=60)
Glucose, Bld: 179 mg/dL — ABNORMAL HIGH (ref 65–99)
Potassium: 4.1 mmol/L (ref 3.5–5.3)
Sodium: 139 mmol/L (ref 135–146)

## 2018-08-05 LAB — VITAMIN D 1,25 DIHYDROXY
Vitamin D 1, 25 (OH)2 Total: 17 pg/mL — ABNORMAL LOW (ref 18–72)
Vitamin D2 1, 25 (OH)2: <8 pg/mL
Vitamin D3 1, 25 (OH)2: 17 pg/mL

## 2018-08-07 ENCOUNTER — Encounter: Payer: Self-pay | Admitting: Family Medicine

## 2018-08-07 ENCOUNTER — Encounter: Payer: Self-pay | Admitting: Internal Medicine

## 2018-08-07 DIAGNOSIS — N189 Chronic kidney disease, unspecified: Secondary | ICD-10-CM

## 2018-08-07 LAB — CALCIUM, URINE, 24 HOUR: Calcium, 24H Urine: 59 mg/24 h

## 2018-08-07 LAB — CREATININE, URINE, 24 HOUR: Creatinine, 24H Ur: 1.5 g/(24.h) (ref 0.50–2.15)

## 2018-08-07 NOTE — Telephone Encounter (Signed)
FYI

## 2018-08-12 ENCOUNTER — Other Ambulatory Visit (HOSPITAL_COMMUNITY): Payer: Self-pay | Admitting: Internal Medicine

## 2018-08-12 ENCOUNTER — Encounter: Payer: Self-pay | Admitting: Family Medicine

## 2018-08-15 ENCOUNTER — Ambulatory Visit
Admission: RE | Admit: 2018-08-15 | Discharge: 2018-08-15 | Disposition: A | Discharge status: Home / Self Care | Payer: BC Managed Care – PPO | Source: Ambulatory Visit | Attending: Obstetrics and Gynecology | Admitting: Obstetrics and Gynecology

## 2018-08-15 ENCOUNTER — Other Ambulatory Visit: Payer: Self-pay

## 2018-08-15 DIAGNOSIS — Z1231 Encounter for screening mammogram for malignant neoplasm of breast: Secondary | ICD-10-CM

## 2018-08-20 ENCOUNTER — Encounter: Payer: Self-pay | Admitting: Family Medicine

## 2018-08-21 ENCOUNTER — Other Ambulatory Visit: Payer: Self-pay

## 2018-08-26 NOTE — Progress Notes (Deleted)
[facility] [address], [place], [place] [address] [phone]  Date:  08/28/2018   Name:  Kathleen [name]   DOB:  27-Jul-1955   MRN:  [id number]  PCP:  Darreld Mclean, MD    Chief Complaint: No chief complaint on file.   History of Present Illness:  Kathleen [name] is a 63 y.o. very pleasant female patient who presents with the following:  Here today for a mole removal  Patient Active Problem List   Diagnosis Date Noted  . Vitamin D deficiency 07/22/2018  . Complete rotator cuff tear or rupture of right shoulder, not specified as traumatic 02/27/2017  . Auditory hallucination 12/13/2016  . Tremor of both hands 11/20/2016  . Gout 06/29/2016  . Type 2 diabetes mellitus ([place]) 12/31/2015  . Chest pain with moderate risk for cardiac etiology 12/29/2015  . Coronary artery dissection   . Pure hypercholesterolemia   . Lung nodule seen on imaging study 09/24/2014  . Paroxysmal atrial fibrillation ([place]) 09/11/2014  . Asthmatic bronchitis with exacerbation 05/25/2014  . Primary hyperparathyroidism ([place]) 04/15/2014  . Palpitations 03/20/2014  . Radicular low back pain 11/24/2013  . Chronic diastolic heart failure ([place]) 10/02/2013  . Shortness of breath 08/31/2013  . Venous stasis dermatitis of both lower extremities 08/19/2013  . Peripheral edema 06/12/2013  . Other malaise and fatigue 06/12/2013  . Nocturia 06/12/2013  . Cellulitis of leg, right 10/11/2012  . Morbid obesity ([place]) 08/09/2012  . Urinary incontinence, urge 07/11/2012  . CAD (coronary artery disease) 10/31/2010  . NEOPLASM OF UNCERTAIN BEHAVIOR OF SKIN 02/11/2010  . Bipolar disorder ([place]) 09/14/2009  . ACUT MYOCARD INFARCT UNS SITE SUBSQT EPIS CARE 07/02/2008  . EDEMA 06/23/2008  . Hyperlipidemia 05/23/2008  . UNSPECIFIED DISORDER OF THYROID 02/04/2008  . Obesity 02/04/2008  . HYPERTENSION, BENIGN ESSENTIAL 02/04/2008  . MYOCARDIAL INFARCTION, HX OF  03/05/2007    Past Medical History:  Diagnosis Date  . Anemia   . Anxiety   . Asthma   . Atrial fibrillation ([place])   . Bipolar disorder ([place])   . Cancer ([place])    basal cell carcinoma on right hand, papilloma left breast  . CHF (congestive heart failure) ([place])    pt seen at heart/vascular spec clinic 08/14/2014   . Coarse tremors    hands  . Coronary artery disease   . Depression   . DVT (deep vein thrombosis) in pregnancy    pt. reports that it was post knee surgery, not during pregnancy  . Dyslipidemia   . Fall   . Fatty liver   . Headache    migraines - stopped after menopause  . Heart attack ([place]) 02/2007   non-q-wave with second septal perforator  . Hypertension   . Hypothyroidism    history of took medication, has resolved  . Knee pain, left   . Lower extremity deep venous thrombosis (HCC)    20 years ago   . Measles    hx of in childhood   . Morbid obesity with BMI of 40.0-44.9, adult ([place])   . Pain at surgical incision    Left breast from procedure 09/11/16  . Pneumonia    hx of   . PONV (postoperative nausea and vomiting)    also slow to wake up  . Psychiatric hospitalization 05/2008  . Shingles    20 years ago   . Shortness of breath dyspnea    exercise   .  Type 2 diabetes mellitus ([place]) 12/31/2015  . Urinary incontinence     Past Surgical History:  Procedure Laterality Date  . BASAL CELL CARCINOMA EXCISION    . BREAST EXCISIONAL BIOPSY Left   . BREAST LUMPECTOMY WITH RADIOACTIVE SEED LOCALIZATION Left 09/11/2016   Procedure: LEFT BREAST LUMPECTOMY WITH RADIOACTIVE SEED LOCALIZATION;  Surgeon: Excell Seltzer, MD;  Location: [facility];  Service: General;  Laterality: Left;  . BREAST MASS EXCISION     age 46, benign tumor  . CARDIAC CATHETERIZATION    . CARDIAC CATHETERIZATION N/A 12/30/2015   Procedure: Left Heart Cath and Coronary Angiography;  Surgeon: Belva Crome, MD;  Location: [facility];  Service: Cardiovascular;  Laterality: N/A;  .  COLONOSCOPY    . DILATATION & CURETTAGE/HYSTEROSCOPY WITH MYOSURE N/A 09/22/2016   Procedure: DILATATION & CURETTAGE/HYSTEROSCOPY WITH MYOSURE;  Surgeon: Molli Posey, MD;  Location: [facility];  Service: Gynecology;  Laterality: N/A;  . DILATION AND CURETTAGE OF UTERUS    . KNEE SURGERY     right, removed cartilage  . PARATHYROIDECTOMY N/A 08/25/2014   Procedure: PARATHYROIDECTOMY;  Surgeon: Armandina Gemma, MD;  [facility];  Service: General;  Laterality: N/A;  . SHOULDER ARTHROSCOPY WITH ROTATOR CUFF REPAIR AND SUBACROMIAL DECOMPRESSION Right 02/27/2017   Procedure: Right shoulder arthroscopic rotator cuff repair with biceps tenodesis and subacromial decompression;  Surgeon: Nicholes Stairs, MD;  Location: [facility];  Service: Orthopedics;  Laterality: Right;  150 mins  . TONSILLECTOMY    . TUBAL LIGATION      Social History   Tobacco Use  . Smoking status: Never Smoker  . Smokeless tobacco: Never Used  Substance Use Topics  . Alcohol use: Yes    Alcohol/week: 0.0 standard drinks    Comment: occ.  . Drug use: No    Family History  Problem Relation Age of Onset  . Breast cancer Mother 23  . Alcohol abuse Mother   . Alzheimer's disease Mother   . Alcohol abuse Father   . Alzheimer's disease Father   . Parkinson's disease Father   . Alcohol abuse Maternal Grandfather   . Drug abuse Maternal Grandfather   . Alcohol abuse Maternal Grandmother   . Alcohol abuse Paternal Grandfather   . Alcohol abuse Paternal Grandmother   . Schizophrenia Cousin   . Diabetes Brother   . Hypertension Brother     Allergies  Allergen Reactions  . Eggs Or Egg-Derived Products Hives and Other (See Comments)    Can eat foods with cooked eggs; CANNOT handle when part of flu vaccine, etc.   . Lamictal [Lamotrigine] Hives and Rash    Medication list has been reviewed and updated.  Current Outpatient Medications on File Prior to Visit  Medication Sig Dispense Refill  . albuterol (PROVENTIL  HFA;VENTOLIN HFA) 108 (90 BASE) MCG/ACT inhaler Inhale 2 puffs into the lungs every 6 (six) hours as needed for wheezing. 1 Inhaler 1  . allopurinol (ZYLOPRIM) 100 MG tablet TAKE 200 MG BY MOUTH EVERY DAY 60 tablet 6  . benztropine (COGENTIN) 1 MG tablet Take 1 mg by mouth 2 (two) times daily.  2  . divalproex (DEPAKOTE ER) 500 MG 24 hr tablet Take 1,500 mg by mouth at bedtime.     . furosemide (LASIX) 80 MG tablet TAKE 1 TAB DAILY. MAY ALSO TAKE 1/2 TAB AS NEEDED FOR FLUID. 45 tablet 3  . JARDIANCE 10 MG TABS tablet TAKE ONE TABLET BY MOUTH DAILY 90 tablet 5  . LATUDA  60 MG TABS Take 60 mg by mouth at bedtime.     [name] lisinopril (ZESTRIL) 20 MG tablet Take 1 tablet (20 mg total) by mouth daily. 90 tablet 1  . metFORMIN (GLUCOPHAGE) 500 MG tablet TAKE ONE TABLET BY MOUTH TWICE A DAY WITH MEALS 180 tablet 1  . metoprolol tartrate (LOPRESSOR) 25 MG tablet TAKE ONE TABLET BY MOUTH TWICE A DAY 180 tablet 2  . nitroGLYCERIN (NITROSTAT) 0.4 MG SL tablet Place 1 tablet (0.4 mg total) under the tongue every 5 (five) minutes as needed for chest pain. For chest pain 25 tablet 3  . rosuvastatin (CRESTOR) 20 MG tablet TAKE ONE TABLET BY MOUTH DAILY 90 tablet 3  . triamcinolone ointment (KENALOG) 0.1 % APPLY TO AFFECTED AREA TWICE A DAY FOR 14 DAYS (Patient taking differently: Apply 1 application topically 2 (two) times daily as needed (for dry skin). ) 30 g 1  . UNABLE TO FIND Med Name: One Touch Verio blood glucose meter Lot #[id number] x Exp 01/22/2018    . XARELTO 20 MG TABS tablet TAKE ONE TABLET BY MOUTH AT BEDTIME 30 tablet 4   No current facility-administered medications on file prior to visit.     Review of Systems:  As per HPI- otherwise negative.   Physical Examination: There were no vitals filed for this visit. There were no vitals filed for this visit. There is no height or weight on file to calculate BMI. Ideal Body Weight:    GEN: WDWN, NAD, Non-toxic, A & O x 3 HEENT: Atraumatic,  Normocephalic. Neck supple. No masses, No LAD. Ears and Nose: No external deformity. CV: RRR, No M/G/R. No JVD. No thrill. No extra heart sounds. PULM: CTA B, no wheezes, crackles, rhonchi. No retractions. No resp. distress. No accessory muscle use. ABD: S, NT, ND, +BS. No rebound. No HSM. EXTR: No c/c/e NEURO Normal gait.  PSYCH: Normally interactive. Conversant. Not depressed or anxious appearing.  Calm demeanor.    Assessment and Plan: ***  Signed Lamar Blinks, MD

## 2018-08-28 ENCOUNTER — Ambulatory Visit: Payer: BC Managed Care – PPO | Admitting: Family Medicine

## 2018-08-30 ENCOUNTER — Telehealth: Payer: Self-pay

## 2018-08-30 NOTE — Telephone Encounter (Signed)
Copied from [place] [phone]. Topic: General - Other >> Aug 30, 2018 11:53 AM Burchel, Abbi R wrote: Pt wants to confirm she will not be charged cx fee for changing appt time. Please call pt to discuss: [phone]

## 2018-09-01 ENCOUNTER — Encounter: Payer: Self-pay | Admitting: Family Medicine

## 2018-09-01 NOTE — Progress Notes (Signed)
[place] at [facility] [address], [place], [place] [address] [phone]  Date:  09/04/2018   Name:  Kathleen [name]   DOB:  08-Oct-1955   MRN:  [id number]  PCP:  Darreld Mclean, MD    Chief Complaint: Mole Removal   History of Present Illness:  Kathleen [name] is a 63 y.o. very pleasant female patient who presents with the following:  Would like a mole removed from her lower leg-she noted a small brown spot on the anterior right shin, just distal to the knee about 2 weeks ago  She also has noticed an irritated red macule on the medial portion of the left shin  Patient Active Problem List   Diagnosis Date Noted  . Vitamin D deficiency 07/22/2018  . Complete rotator cuff tear or rupture of right shoulder, not specified as traumatic 02/27/2017  . Auditory hallucination 12/13/2016  . Tremor of both hands 11/20/2016  . Gout 06/29/2016  . Type 2 diabetes mellitus ([place]) 12/31/2015  . Chest pain with moderate risk for cardiac etiology 12/29/2015  . Coronary artery dissection   . Pure hypercholesterolemia   . Lung nodule seen on imaging study 09/24/2014  . Paroxysmal atrial fibrillation ([place]) 09/11/2014  . Asthmatic bronchitis with exacerbation 05/25/2014  . Primary hyperparathyroidism ([place]) 04/15/2014  . Palpitations 03/20/2014  . Radicular low back pain 11/24/2013  . Chronic diastolic heart failure ([place]) 10/02/2013  . Shortness of breath 08/31/2013  . Venous stasis dermatitis of both lower extremities 08/19/2013  . Peripheral edema 06/12/2013  . Other malaise and fatigue 06/12/2013  . Nocturia 06/12/2013  . Cellulitis of leg, right 10/11/2012  . Morbid obesity ([place]) 08/09/2012  . Urinary incontinence, urge 07/11/2012  . CAD (coronary artery disease) 10/31/2010  . NEOPLASM OF UNCERTAIN BEHAVIOR OF SKIN 02/11/2010  . Bipolar disorder ([place]) 09/14/2009  . ACUT MYOCARD INFARCT UNS SITE SUBSQT EPIS CARE 07/02/2008  . EDEMA  06/23/2008  . Hyperlipidemia 05/23/2008  . UNSPECIFIED DISORDER OF THYROID 02/04/2008  . Obesity 02/04/2008  . HYPERTENSION, BENIGN ESSENTIAL 02/04/2008  . MYOCARDIAL INFARCTION, HX OF 03/05/2007    Past Medical History:  Diagnosis Date  . Anemia   . Anxiety   . Asthma   . Atrial fibrillation ([place])   . Bipolar disorder ([place])   . Cancer ([place])    basal cell carcinoma on right hand, papilloma left breast  . CHF (congestive heart failure) ([place])    pt seen at heart/vascular spec clinic 08/14/2014   . Coarse tremors    hands  . Coronary artery disease   . Depression   . DVT (deep vein thrombosis) in pregnancy    pt. reports that it was post knee surgery, not during pregnancy  . Dyslipidemia   . Fall   . Fatty liver   . Headache    migraines - stopped after menopause  . Heart attack ([place]) 02/2007   non-q-wave with second septal perforator  . Hypertension   . Hypothyroidism    history of took medication, has resolved  . Knee pain, left   . Lower extremity deep venous thrombosis (HCC)    20 years ago   . Measles    hx of in childhood   . Morbid obesity with BMI of 40.0-44.9, adult ([place])   . Pain at surgical incision    Left breast from procedure 09/11/16  . Pneumonia    hx of   . PONV (postoperative nausea  and vomiting)    also slow to wake up  . Psychiatric hospitalization 05/2008  . Shingles    20 years ago   . Shortness of breath dyspnea    exercise   . Type 2 diabetes mellitus ([place]) 12/31/2015  . Urinary incontinence     Past Surgical History:  Procedure Laterality Date  . BASAL CELL CARCINOMA EXCISION    . BREAST EXCISIONAL BIOPSY Left   . BREAST LUMPECTOMY WITH RADIOACTIVE SEED LOCALIZATION Left 09/11/2016   Procedure: LEFT BREAST LUMPECTOMY WITH RADIOACTIVE SEED LOCALIZATION;  Surgeon: Excell Seltzer, MD;  Location: [facility];  Service: General;  Laterality: Left;  . BREAST MASS EXCISION     age 58, benign tumor  . CARDIAC CATHETERIZATION    . CARDIAC  CATHETERIZATION N/A 12/30/2015   Procedure: Left Heart Cath and Coronary Angiography;  Surgeon: Belva Crome, MD;  Location: [facility];  Service: Cardiovascular;  Laterality: N/A;  . COLONOSCOPY    . DILATATION & CURETTAGE/HYSTEROSCOPY WITH MYOSURE N/A 09/22/2016   Procedure: DILATATION & CURETTAGE/HYSTEROSCOPY WITH MYOSURE;  Surgeon: Molli Posey, MD;  Location: [facility];  Service: Gynecology;  Laterality: N/A;  . DILATION AND CURETTAGE OF UTERUS    . KNEE SURGERY     right, removed cartilage  . PARATHYROIDECTOMY N/A 08/25/2014   Procedure: PARATHYROIDECTOMY;  Surgeon: Armandina Gemma, MD;  Location: [facility];  Service: General;  Laterality: N/A;  . SHOULDER ARTHROSCOPY WITH ROTATOR CUFF REPAIR AND SUBACROMIAL DECOMPRESSION Right 02/27/2017   Procedure: Right shoulder arthroscopic rotator cuff repair with biceps tenodesis and subacromial decompression;  Surgeon: Nicholes Stairs, MD;  Location: [facility];  Service: Orthopedics;  Laterality: Right;  150 mins  . TONSILLECTOMY    . TUBAL LIGATION      Social History   Tobacco Use  . Smoking status: Never Smoker  . Smokeless tobacco: Never Used  Substance Use Topics  . Alcohol use: Yes    Alcohol/week: 0.0 standard drinks    Comment: occ.  . Drug use: No    Family History  Problem Relation Age of Onset  . Breast cancer Mother 18  . Alcohol abuse Mother   . Alzheimer's disease Mother   . Alcohol abuse Father   . Alzheimer's disease Father   . Parkinson's disease Father   . Alcohol abuse Maternal Grandfather   . Drug abuse Maternal Grandfather   . Alcohol abuse Maternal Grandmother   . Alcohol abuse Paternal Grandfather   . Alcohol abuse Paternal Grandmother   . Schizophrenia Cousin   . Diabetes Brother   . Hypertension Brother     Allergies  Allergen Reactions  . Eggs Or Egg-Derived Products Hives and Other (See Comments)    Can eat foods with cooked eggs; CANNOT handle when part of flu vaccine, etc.   . Lamictal  [Lamotrigine] Hives and Rash    Medication list has been reviewed and updated.  Current Outpatient Medications on File Prior to Visit  Medication Sig Dispense Refill  . albuterol (PROVENTIL HFA;VENTOLIN HFA) 108 (90 BASE) MCG/ACT inhaler Inhale 2 puffs into the lungs every 6 (six) hours as needed for wheezing. 1 Inhaler 1  . allopurinol (ZYLOPRIM) 100 MG tablet TAKE 200 MG BY MOUTH EVERY DAY 60 tablet 6  . benztropine (COGENTIN) 1 MG tablet Take 1 mg by mouth 2 (two) times daily.  2  . divalproex (DEPAKOTE ER) 500 MG 24 hr tablet Take 1,500 mg by mouth at bedtime.     . furosemide (LASIX)  80 MG tablet TAKE 1 TAB DAILY. MAY ALSO TAKE 1/2 TAB AS NEEDED FOR FLUID. 45 tablet 3  . JARDIANCE 10 MG TABS tablet TAKE ONE TABLET BY MOUTH DAILY 90 tablet 5  . LATUDA 60 MG TABS Take 60 mg by mouth at bedtime.     [name] lisinopril (ZESTRIL) 20 MG tablet Take 1 tablet (20 mg total) by mouth daily. 90 tablet 1  . metFORMIN (GLUCOPHAGE) 500 MG tablet TAKE ONE TABLET BY MOUTH TWICE A DAY WITH MEALS 180 tablet 1  . metoprolol tartrate (LOPRESSOR) 25 MG tablet TAKE ONE TABLET BY MOUTH TWICE A DAY 180 tablet 2  . nitroGLYCERIN (NITROSTAT) 0.4 MG SL tablet Place 1 tablet (0.4 mg total) under the tongue every 5 (five) minutes as needed for chest pain. For chest pain 25 tablet 3  . rosuvastatin (CRESTOR) 20 MG tablet TAKE ONE TABLET BY MOUTH DAILY 90 tablet 3  . triamcinolone ointment (KENALOG) 0.1 % APPLY TO AFFECTED AREA TWICE A DAY FOR 14 DAYS (Patient taking differently: Apply 1 application topically 2 (two) times daily as needed (for dry skin). ) 30 g 1  . UNABLE TO FIND Med Name: One Touch Verio blood glucose meter Lot #[id number] x Exp 01/22/2018    . XARELTO 20 MG TABS tablet TAKE ONE TABLET BY MOUTH AT BEDTIME 30 tablet 4   No current facility-administered medications on file prior to visit.     Review of Systems:  As per HPI- otherwise negative.   Physical Examination: Vitals:   09/04/18 0958  BP:  126/80  Pulse: 82  Resp: 16  Temp: (!) 97.4 F (36.3 C)  SpO2: 98%   Vitals:   09/04/18 0958  Weight: (!) 328 lb (148.8 kg)  Height: 6' (1.829 m)   Body mass index is 44.48 kg/m. Ideal Body Weight: Weight in (lb) to have BMI = 25: 183.9   GEN: WDWN, NAD, Non-toxic, Alert & Oriented x 3, obese, looks well HEENT: Atraumatic, Normocephalic.  Ears and Nose: No external deformity. EXTR: No clubbing/cyanosis/edema NEURO: Normal gait.  PSYCH: Normally interactive. Conversant. Not depressed or anxious appearing.  Calm demeanor.   Tiny spot of hyperpigmentation on the right anterior shin just distal to the knee.  On examination, this seems to be a retained plug of sebaceous material.  As how to extract this with pressure from my fingers.  This did leave a small bruise, but I do not believe there is any other skin abnormality at this time  She has chronic hyperpigmentation of both ankles.  There is a new area which is slightly more erythematous, on the medial right ankle Assessment and Plan:   ICD-10-CM   1. Skin lesion  L98.9 triamcinolone ointment (KENALOG) 0.5 %   Patient came in for mole removal, but I do not think there is actually a mole.  I have asked her to let me know if there is an abnormality once her bruises resolved Gave her prescription for triamcinolone to use on her skin lesion-she will let me know if not resolved after 2 weeks No charge for today  Follow-up: No follow-ups on file.  Meds ordered this encounter  Medications  . triamcinolone ointment (KENALOG) 0.5 %    Sig: Apply 1 application topically 2 (two) times daily. Use twice daily for spot on ankle as needed    Dispense:  30 g    Refill:  1   No orders of the defined types were placed in this encounter.   @SIGN @  Signed Lamar Blinks, MD

## 2018-09-02 LAB — HM DIABETES EYE EXAM

## 2018-09-04 ENCOUNTER — Other Ambulatory Visit: Payer: Self-pay

## 2018-09-04 ENCOUNTER — Ambulatory Visit (INDEPENDENT_AMBULATORY_CARE_PROVIDER_SITE_OTHER): Payer: BC Managed Care – PPO | Admitting: Family Medicine

## 2018-09-04 ENCOUNTER — Encounter: Payer: Self-pay | Admitting: Family Medicine

## 2018-09-04 VITALS — BP 126/80 | HR 82 | Temp 97.4°F | Resp 16 | Ht 72.0 in | Wt 328.0 lb

## 2018-09-04 DIAGNOSIS — L989 Disorder of the skin and subcutaneous tissue, unspecified: Secondary | ICD-10-CM

## 2018-09-04 MED ORDER — TRIAMCINOLONE ACETONIDE 0.5 % EX OINT: 1.0000 "application " | TOPICAL_OINTMENT | Freq: Two times a day (BID) | CUTANEOUS | 1 refills | Status: DC

## 2018-09-04 NOTE — Patient Instructions (Signed)
Good to see you today- I gave a stronger steroid cream for the little spot on your right ankle I think you just had a little trapped skin oil causing the dark spot under your knee.  Let me know if this is not gone!

## 2018-09-11 ENCOUNTER — Encounter: Payer: Self-pay | Admitting: Family Medicine

## 2018-09-12 ENCOUNTER — Other Ambulatory Visit (HOSPITAL_COMMUNITY): Payer: Self-pay | Admitting: Internal Medicine

## 2018-09-15 ENCOUNTER — Other Ambulatory Visit (HOSPITAL_COMMUNITY): Payer: Self-pay | Admitting: Internal Medicine

## 2018-09-16 ENCOUNTER — Encounter: Payer: Self-pay | Admitting: Family Medicine

## 2018-09-16 ENCOUNTER — Encounter: Payer: Self-pay | Admitting: Internal Medicine

## 2018-09-19 ENCOUNTER — Encounter: Payer: Self-pay | Admitting: Family Medicine

## 2018-09-20 NOTE — Telephone Encounter (Signed)
Called [facility]- they confimed that pt has an appt next week and is aware of appt

## 2018-10-10 ENCOUNTER — Other Ambulatory Visit: Payer: Self-pay

## 2018-10-10 ENCOUNTER — Emergency Department (HOSPITAL_BASED_OUTPATIENT_CLINIC_OR_DEPARTMENT_OTHER): Payer: BC Managed Care – PPO

## 2018-10-10 ENCOUNTER — Emergency Department (HOSPITAL_BASED_OUTPATIENT_CLINIC_OR_DEPARTMENT_OTHER)
Admission: EM | Admit: 2018-10-10 | Discharge: 2018-10-10 | Disposition: A | Discharge status: Home / Self Care | Payer: BC Managed Care – PPO | Attending: Emergency Medicine | Admitting: Emergency Medicine

## 2018-10-10 ENCOUNTER — Encounter (HOSPITAL_BASED_OUTPATIENT_CLINIC_OR_DEPARTMENT_OTHER): Payer: Self-pay | Admitting: *Deleted

## 2018-10-10 ENCOUNTER — Other Ambulatory Visit: Payer: Self-pay | Admitting: Physician Assistant

## 2018-10-10 DIAGNOSIS — S40812A Abrasion of left upper arm, initial encounter: Secondary | ICD-10-CM | POA: Insufficient documentation

## 2018-10-10 DIAGNOSIS — W109XXA Fall (on) (from) unspecified stairs and steps, initial encounter: Secondary | ICD-10-CM | POA: Diagnosis not present

## 2018-10-10 DIAGNOSIS — Z23 Encounter for immunization: Secondary | ICD-10-CM | POA: Insufficient documentation

## 2018-10-10 DIAGNOSIS — Y999 Unspecified external cause status: Secondary | ICD-10-CM | POA: Diagnosis not present

## 2018-10-10 DIAGNOSIS — M79641 Pain in right hand: Secondary | ICD-10-CM | POA: Diagnosis not present

## 2018-10-10 DIAGNOSIS — I5032 Chronic diastolic (congestive) heart failure: Secondary | ICD-10-CM | POA: Insufficient documentation

## 2018-10-10 DIAGNOSIS — I110 Hypertensive heart disease with heart failure: Secondary | ICD-10-CM | POA: Insufficient documentation

## 2018-10-10 DIAGNOSIS — S80812A Abrasion, left lower leg, initial encounter: Secondary | ICD-10-CM | POA: Diagnosis not present

## 2018-10-10 DIAGNOSIS — Y929 Unspecified place or not applicable: Secondary | ICD-10-CM | POA: Diagnosis not present

## 2018-10-10 DIAGNOSIS — I480 Paroxysmal atrial fibrillation: Secondary | ICD-10-CM | POA: Insufficient documentation

## 2018-10-10 DIAGNOSIS — Z7901 Long term (current) use of anticoagulants: Secondary | ICD-10-CM | POA: Insufficient documentation

## 2018-10-10 DIAGNOSIS — Y9301 Activity, walking, marching and hiking: Secondary | ICD-10-CM | POA: Insufficient documentation

## 2018-10-10 DIAGNOSIS — N183 Chronic kidney disease, stage 3 unspecified: Secondary | ICD-10-CM

## 2018-10-10 DIAGNOSIS — S40811A Abrasion of right upper arm, initial encounter: Secondary | ICD-10-CM | POA: Insufficient documentation

## 2018-10-10 DIAGNOSIS — S0990XA Unspecified injury of head, initial encounter: Secondary | ICD-10-CM | POA: Diagnosis present

## 2018-10-10 DIAGNOSIS — T148XXA Other injury of unspecified body region, initial encounter: Secondary | ICD-10-CM

## 2018-10-10 MED ORDER — TETANUS-DIPHTH-ACELL PERTUSSIS 5-2.5-18.5 LF-MCG/0.5 IM SUSP
0.5000 mL | Freq: Once | INTRAMUSCULAR | Status: AC
  Administered 2018-10-10: 0.5 mL via INTRAMUSCULAR
  Filled 2018-10-10: qty 0.5

## 2018-10-10 NOTE — Discharge Instructions (Signed)
It was my pleasure taking care of you today!  Keep wounds clean and dry.  Follow up with your primary care doctor if symptoms not improving.   Return to ER for new or worsening symptoms, any additional concerns.

## 2018-10-10 NOTE — ED Provider Notes (Signed)
[facility] Provider Note   CSN: [id number] Arrival date & time: 10/10/18  1701     History   Chief Complaint Chief Complaint  Patient presents with  . Head Injury    HPI Kathleen [name] is a 63 y.o. female.     The history is provided by the patient and medical records. No language interpreter was used.  Head Injury Associated symptoms: headache   Associated symptoms: no numbness    Kathleen [name] is a 63 y.o. female  with a PMH as listed below who presents to the Emergency Department for evaluation after head injury just prior to arrival.  Patient states that she was going down the steps outside in the rain when her foot slipped, causing her to fall backwards, striking her head and also causing several scattered abrasions to her upper and lower extremities from the concrete.  She is on blood thinners, therefore her daughter who is a Marine scientist, strongly encouraged her to come to the ER out of concerns of head injury.  Denies loss of consciousness, nausea or vomiting.  She did catch herself with her right hand and does have some pain there as well.  No numbness or weakness.  No medications prior to arrival for symptoms.  Unsure of last tetanus vaccine.  Past Medical History:  Diagnosis Date  . Anemia   . Anxiety   . Asthma   . Atrial fibrillation ([place])   . Bipolar disorder ([place])   . Cancer ([place])    basal cell carcinoma on right hand, papilloma left breast  . CHF (congestive heart failure) ([place])    pt seen at heart/vascular spec clinic 08/14/2014   . Coarse tremors    hands  . Coronary artery disease   . Depression   . DVT (deep vein thrombosis) in pregnancy    pt. reports that it was post knee surgery, not during pregnancy  . Dyslipidemia   . Fall   . Fatty liver   . Headache    migraines - stopped after menopause  . Heart attack ([place]) 02/2007   non-q-wave with second septal perforator  . Hypertension   . Hypothyroidism    history of took  medication, has resolved  . Knee pain, left   . Lower extremity deep venous thrombosis (HCC)    20 years ago   . Measles    hx of in childhood   . Morbid obesity with BMI of 40.0-44.9, adult ([place])   . Pain at surgical incision    Left breast from procedure 09/11/16  . Pneumonia    hx of   . PONV (postoperative nausea and vomiting)    also slow to wake up  . Psychiatric hospitalization 05/2008  . Shingles    20 years ago   . Shortness of breath dyspnea    exercise   . Type 2 diabetes mellitus ([place]) 12/31/2015  . Urinary incontinence     Patient Active Problem List   Diagnosis Date Noted  . Vitamin D deficiency 07/22/2018  . Complete rotator cuff tear or rupture of right shoulder, not specified as traumatic 02/27/2017  . Auditory hallucination 12/13/2016  . Tremor of both hands 11/20/2016  . Gout 06/29/2016  . Type 2 diabetes mellitus ([place]) 12/31/2015  . Chest pain with moderate risk for cardiac etiology 12/29/2015  . Coronary artery dissection   . Pure hypercholesterolemia   . Lung nodule seen on imaging study 09/24/2014  . Paroxysmal atrial fibrillation ([place]) 09/11/2014  .  Asthmatic bronchitis with exacerbation 05/25/2014  . Primary hyperparathyroidism ([place]) 04/15/2014  . Palpitations 03/20/2014  . Radicular low back pain 11/24/2013  . Chronic diastolic heart failure ([place]) 10/02/2013  . Shortness of breath 08/31/2013  . Venous stasis dermatitis of both lower extremities 08/19/2013  . Peripheral edema 06/12/2013  . Other malaise and fatigue 06/12/2013  . Nocturia 06/12/2013  . Cellulitis of leg, right 10/11/2012  . Morbid obesity ([place]) 08/09/2012  . Urinary incontinence, urge 07/11/2012  . CAD (coronary artery disease) 10/31/2010  . NEOPLASM OF UNCERTAIN BEHAVIOR OF SKIN 02/11/2010  . Bipolar disorder ([place]) 09/14/2009  . ACUT MYOCARD INFARCT UNS SITE SUBSQT EPIS CARE 07/02/2008  . EDEMA 06/23/2008  . Hyperlipidemia 05/23/2008  . UNSPECIFIED DISORDER OF THYROID  02/04/2008  . Obesity 02/04/2008  . HYPERTENSION, BENIGN ESSENTIAL 02/04/2008  . MYOCARDIAL INFARCTION, HX OF 03/05/2007    Past Surgical History:  Procedure Laterality Date  . BASAL CELL CARCINOMA EXCISION    . BREAST EXCISIONAL BIOPSY Left   . BREAST LUMPECTOMY WITH RADIOACTIVE SEED LOCALIZATION Left 09/11/2016   Procedure: LEFT BREAST LUMPECTOMY WITH RADIOACTIVE SEED LOCALIZATION;  Surgeon: Excell Seltzer, MD;  Location: [facility];  Service: General;  Laterality: Left;  . BREAST MASS EXCISION     age 3, benign tumor  . CARDIAC CATHETERIZATION    . CARDIAC CATHETERIZATION N/A 12/30/2015   Procedure: Left Heart Cath and Coronary Angiography;  Surgeon: Belva Crome, MD;  Location: [facility];  Service: Cardiovascular;  Laterality: N/A;  . COLONOSCOPY    . DILATATION & CURETTAGE/HYSTEROSCOPY WITH MYOSURE N/A 09/22/2016   Procedure: DILATATION & CURETTAGE/HYSTEROSCOPY WITH MYOSURE;  Surgeon: Molli Posey, MD;  Location: [facility];  Service: Gynecology;  Laterality: N/A;  . DILATION AND CURETTAGE OF UTERUS    . KNEE SURGERY     right, removed cartilage  . PARATHYROIDECTOMY N/A 08/25/2014   Procedure: PARATHYROIDECTOMY;  Surgeon: Armandina Gemma, MD;  [facility];  Service: General;  Laterality: N/A;  . SHOULDER ARTHROSCOPY WITH ROTATOR CUFF REPAIR AND SUBACROMIAL DECOMPRESSION Right 02/27/2017   Procedure: Right shoulder arthroscopic rotator cuff repair with biceps tenodesis and subacromial decompression;  Surgeon: Nicholes Stairs, MD;  Location: [facility];  Service: Orthopedics;  Laterality: Right;  150 mins  . TONSILLECTOMY    . TUBAL LIGATION       OB History   No obstetric history on file.      Home Medications    Prior to Admission medications   Medication Sig Start Date End Date Taking? Authorizing Provider  albuterol (PROVENTIL HFA;VENTOLIN HFA) 108 (90 BASE) MCG/ACT inhaler Inhale 2 puffs into the lungs every 6 (six) hours as needed for wheezing. 11/12/12   Ann Held, DO  allopurinol (ZYLOPRIM) 100 MG tablet TAKE 200 MG BY MOUTH EVERY DAY 04/05/18   [name], [name], MD  benztropine (COGENTIN) 1 MG tablet Take 1 mg by mouth 2 (two) times daily. 01/14/17   [provider]  divalproex (DEPAKOTE ER) 500 MG 24 hr tablet Take 1,500 mg by mouth at bedtime.     [provider]  furosemide (LASIX) 80 MG tablet TAKE 1 TAB DAILY. MAY ALSO TAKE 1/2 TAB AS NEEDED FOR FLUID. 03/18/18   Bensimhon, Shaune Pascal, MD  JARDIANCE 10 MG TABS tablet TAKE ONE TABLET BY MOUTH DAILY 12/25/17   Bensimhon, Shaune Pascal, MD  LATUDA 60 MG TABS Take 60 mg by mouth at bedtime.  03/08/16   [provider]  lisinopril (ZESTRIL)  20 MG tablet Take 1 tablet (20 mg total) by mouth daily. 06/05/18   [name], [name], MD  metFORMIN (GLUCOPHAGE) 500 MG tablet TAKE ONE TABLET BY MOUTH TWICE A DAY WITH MEALS 04/23/18   [name], [name], MD  metoprolol tartrate (LOPRESSOR) 25 MG tablet TAKE ONE TABLET BY MOUTH TWICE A DAY 08/12/18   Bensimhon, Shaune Pascal, MD  nitroGLYCERIN (NITROSTAT) 0.4 MG SL tablet Place 1 tablet (0.4 mg total) under the tongue every 5 (five) minutes as needed for chest pain. For chest pain 12/07/11   Barrett, Evelene Croon, PA-C  rosuvastatin (CRESTOR) 20 MG tablet TAKE ONE TABLET BY MOUTH DAILY 01/14/18   [name], [name], MD  triamcinolone ointment (KENALOG) 0.1 % APPLY TO AFFECTED AREA TWICE A DAY FOR 14 DAYS Patient taking differently: Apply 1 application topically 2 (two) times daily as needed (for dry skin).  06/29/16   Midge Minium, MD  triamcinolone ointment (KENALOG) 0.5 % Apply 1 application topically 2 (two) times daily. Use twice daily for spot on ankle as needed 09/04/18   [name], [name], MD  UNABLE TO FIND Med Name: One Touch Verio blood glucose meter Lot [id number] x Exp 01/22/2018    [provider]  XARELTO 20 MG TABS tablet TAKE ONE TABLET BY MOUTH AT BEDTIME 09/16/18   Bensimhon, Shaune Pascal, MD    Family History Family  History  Problem Relation Age of Onset  . Breast cancer Mother 56  . Alcohol abuse Mother   . Alzheimer's disease Mother   . Alcohol abuse Father   . Alzheimer's disease Father   . Parkinson's disease Father   . Alcohol abuse Maternal Grandfather   . Drug abuse Maternal Grandfather   . Alcohol abuse Maternal Grandmother   . Alcohol abuse Paternal Grandfather   . Alcohol abuse Paternal Grandmother   . Schizophrenia Cousin   . Diabetes Brother   . Hypertension Brother     Social History Social History   Tobacco Use  . Smoking status: Never Smoker  . Smokeless tobacco: Never Used  Substance Use Topics  . Alcohol use: Yes    Alcohol/week: 0.0 standard drinks    Comment: occ.  . Drug use: No     Allergies   Eggs or egg-derived products and Lamictal [lamotrigine]   Review of Systems Review of Systems  Musculoskeletal: Positive for back pain.  Skin: Positive for wound.  Neurological: Positive for headaches. Negative for syncope, weakness and numbness.  All other systems reviewed and are negative.    Physical Exam Updated Vital Signs BP 116/89   Pulse 85   Temp 98.4 F (36.9 C)   Resp 16   Ht 6' (1.829 m)   Wt (!) 147.4 kg   SpO2 100%   BMI 44.08 kg/m   Physical Exam Vitals signs and nursing note reviewed.  Constitutional:      General: She is not in acute distress.    Appearance: She is well-developed.  HENT:     Head: Normocephalic and atraumatic.  Neck:     Musculoskeletal: Neck supple.  Cardiovascular:     Rate and Rhythm: Normal rate and regular rhythm.     Heart sounds: Normal heart sounds. No murmur.  Pulmonary:     Effort: Pulmonary effort is normal. No respiratory distress.     Breath sounds: Normal breath sounds.  Abdominal:     General: There is no distension.     Palpations: Abdomen is soft.     Tenderness: There  is no abdominal tenderness.  Musculoskeletal:     Comments: No C/T/L-spine tenderness.  Does have tenderness to the  hypothenar eminence of the right hand. 2+ radial pulses bilaterally.  Skin:    General: Skin is warm and dry.     Comments: Several scattered abrasions to the bilateral upper extremities and left lower extremity.  Neurological:     Mental Status: She is alert and oriented to person, place, and time.     Comments: Speech clear and goal oriented. CN 2-12 grossly intact. Normal finger-to-nose and rapid alternating movements. No drift. Strength and sensation intact. Steady gait.       ED Treatments / Results  Labs (all labs ordered are listed, but only abnormal results are displayed) Labs Reviewed - No data to display  EKG None  Radiology Ct Head Wo Contrast  Result Date: 10/10/2018 CLINICAL DATA:  Head trauma, headache. Additional history: Patient reports fall on cement 3 hours ago, hit left forehead. EXAM: CT HEAD WITHOUT CONTRAST TECHNIQUE: Contiguous axial images were obtained from the base of the skull through the vertex without intravenous contrast. COMPARISON:  Head CT 12/11/2016 FINDINGS: Brain: There is no acute intracranial hemorrhage. No demarcated cortical infarction. No evidence of intracranial mass. No midline shift or extra-axial fluid collection. Ill-defined hypoattenuation of the cerebral white matter is nonspecific, but consistent with chronic small vessel ischemic disease. Mild generalized parenchymal atrophy. Vascular: No hyperdense vessel. Skull: No calvarial fracture. Sinuses/Orbits: Imaged globes and orbits demonstrate no acute abnormality. No significant paranasal sinus disease or mastoid effusion at the imaged levels. IMPRESSION: No evidence of acute intracranial abnormality. Generalized atrophy with chronic small vessel ischemic disease. Electronically Signed   By: Kellie Simmering   On: 10/10/2018 17:38   Dg Hand Complete Right  Result Date: 10/10/2018 CLINICAL DATA:  Fall with pain at the fourth and fifth digits EXAM: RIGHT HAND - COMPLETE 3+ VIEW COMPARISON:  09/24/2014  FINDINGS: No acute displaced fracture or malalignment. Old fracture deformity base of fifth metacarpal. No radiopaque foreign body IMPRESSION: No acute osseous abnormality. Electronically Signed   By: Donavan Foil M.D.   On: 10/10/2018 17:56    Procedures Procedures (including critical care time)  Medications Ordered in ED Medications  Tdap (BOOSTRIX) injection 0.5 mL (0.5 mLs Intramuscular Given 10/10/18 1759)     Initial Impression / Assessment and Plan / ED Course  I have reviewed the triage vital signs and the nursing notes.  Pertinent labs & imaging results that were available during my care of the patient were reviewed by me and considered in my medical decision making (see chart for details).       Kathleen [name] is a 63 y.o. female who presents to ED for evaluation after fall just prior to arrival.  Struck her head and is on blood thinners.  Caught herself with her right hand complaining of right hand pain.  She has no focal neurologic deficits on exam.  Affected extremity neurovascularly intact.  Several scattered abrasions, none of which requiring repair.  Tetanus updated.  CT head as well as plain film of the hand without acute findings.Evaluation does not show pathology that would require ongoing emergent intervention or inpatient treatment.  Symptomatic home care instructions, follow-up with PCP and return precautions discussed.  All questions answered.   Final Clinical Impressions(s) / ED Diagnoses   Final diagnoses:  Injury of head, initial encounter  Right hand pain  Abrasion    ED Discharge Orders    None  Gorje Iyer, Ozella Almond, PA-C 10/10/18 Kathaleen Maser    Quintella Reichert, MD 10/10/18 [phone]

## 2018-10-10 NOTE — ED Notes (Signed)
ED Provider at bedside. 

## 2018-10-10 NOTE — ED Notes (Signed)
Abrasions to left anterior leg; right palm and left elbow.

## 2018-10-10 NOTE — ED Triage Notes (Signed)
Pt c/o fall on cement x 3 hrs ago , head injury and multiple abrasions pt is on blood thinners

## 2018-10-13 ENCOUNTER — Encounter: Payer: Self-pay | Admitting: Family Medicine

## 2018-10-14 ENCOUNTER — Ambulatory Visit: Payer: BC Managed Care – PPO | Admitting: Family Medicine

## 2018-10-14 ENCOUNTER — Telehealth: Payer: Self-pay | Admitting: Family Medicine

## 2018-10-14 ENCOUNTER — Other Ambulatory Visit: Payer: Self-pay

## 2018-10-14 ENCOUNTER — Encounter: Payer: Self-pay | Admitting: Family Medicine

## 2018-10-14 VITALS — BP 126/86 | HR 90 | Temp 97.4°F | Resp 18 | Ht 72.0 in | Wt 325.0 lb

## 2018-10-14 DIAGNOSIS — S81812A Laceration without foreign body, left lower leg, initial encounter: Secondary | ICD-10-CM | POA: Diagnosis not present

## 2018-10-14 DIAGNOSIS — T50Z95A Adverse effect of other vaccines and biological substances, initial encounter: Secondary | ICD-10-CM

## 2018-10-14 DIAGNOSIS — W109XXD Fall (on) (from) unspecified stairs and steps, subsequent encounter: Secondary | ICD-10-CM

## 2018-10-14 DIAGNOSIS — E119 Type 2 diabetes mellitus without complications: Secondary | ICD-10-CM | POA: Diagnosis not present

## 2018-10-14 DIAGNOSIS — I872 Venous insufficiency (chronic) (peripheral): Secondary | ICD-10-CM

## 2018-10-14 LAB — CBC
HCT: 44.1 % (ref 36.0–46.0)
Hemoglobin: 14.8 g/dL (ref 12.0–15.0)
MCHC: 33.5 g/dL (ref 30.0–36.0)
MCV: 90.8 fl (ref 78.0–100.0)
Platelets: 123 10*3/uL — ABNORMAL LOW (ref 150.0–400.0)
RBC: 4.86 Mil/uL (ref 3.87–5.11)
RDW: 15.6 % — ABNORMAL HIGH (ref 11.5–15.5)
WBC: 5.3 10*3/uL (ref 4.0–10.5)

## 2018-10-14 LAB — BASIC METABOLIC PANEL
BUN: 27 mg/dL — ABNORMAL HIGH (ref 6–23)
CO2: 29 mEq/L (ref 19–32)
Calcium: 9.5 mg/dL (ref 8.4–10.5)
Chloride: 102 mEq/L (ref 96–112)
Creatinine, Ser: 1.2 mg/dL (ref 0.40–1.20)
GFR: 45.37 mL/min — ABNORMAL LOW (ref >60.00)
Glucose, Bld: 209 mg/dL — ABNORMAL HIGH (ref 70–99)
Potassium: 4.3 mEq/L (ref 3.5–5.1)
Sodium: 142 mEq/L (ref 135–145)

## 2018-10-14 LAB — HEMOGLOBIN A1C: Hgb A1c MFr Bld: 7.1 % — ABNORMAL HIGH (ref 4.6–6.5)

## 2018-10-14 MED ORDER — CEPHALEXIN 500 MG PO CAPS: 500.0000 mg | ORAL_CAPSULE | Freq: Three times a day (TID) | ORAL | 0 refills | Status: DC

## 2018-10-14 NOTE — Telephone Encounter (Signed)
Kathleen [name] on [address] is calling to see if the rescript can be resent or a verbal given for cephALEXin (KEFLEX) 500 MG capsule [id number]  Please advise CB- [phone]

## 2018-10-14 NOTE — Progress Notes (Addendum)
[facility] [address] [place], [place] [address] [phone]  Date:  10/14/2018   Name:  Kathleen [name]   DOB:  08-23-1955   MRN:  [id number]  PCP:  Darreld Mclean, MD    Chief Complaint: Fall (friday, fell outside, abrasions)   History of Present Illness:  Kathleen [name] is a 63 y.o. very pleasant female patient who presents with the following:  Here today with concern of an injury to her leg in area of venous stasis- she was worried that she might get an infection She was seen in the ER on 9/17 after she slipped and fell on a wet staircase.  She slipped and fell onto a concrete sidewalk, had to wait about 20 minutes for someone to find her and give her help The ER did a CT scan of her head- this was negative, also an x-ray of her right hand  She got a tetanus- tdap- booster in her left deltoid which is now sore and itchy She also got a skin tear on her left shin which worried her   No headaches or neck pain Her arm is the most bothersome thing at this point   Last seen by myself in August of this year  Her endocrinologist is Dr Cruzita Lederer   Lab Results  Component Value Date   HGBA1C 6.9 (H) 07/08/2018   Can check A1c today if she likes Flu shot- done already at drug store  Albuterol Allopurinol Cogentin depakote Lasix jardiance latuda Lisinopril metfomrin Lopressor crestor xarelto  Patient Active Problem List   Diagnosis Date Noted  . Vitamin D deficiency 07/22/2018  . Complete rotator cuff tear or rupture of right shoulder, not specified as traumatic 02/27/2017  . Auditory hallucination 12/13/2016  . Tremor of both hands 11/20/2016  . Gout 06/29/2016  . Type 2 diabetes mellitus ([place]) 12/31/2015  . Chest pain with moderate risk for cardiac etiology 12/29/2015  . Coronary artery dissection   . Pure hypercholesterolemia   . Lung nodule seen on imaging study 09/24/2014  . Paroxysmal atrial  fibrillation ([place]) 09/11/2014  . Asthmatic bronchitis with exacerbation 05/25/2014  . Primary hyperparathyroidism ([place]) 04/15/2014  . Palpitations 03/20/2014  . Radicular low back pain 11/24/2013  . Chronic diastolic heart failure ([place]) 10/02/2013  . Shortness of breath 08/31/2013  . Venous stasis dermatitis of both lower extremities 08/19/2013  . Peripheral edema 06/12/2013  . Other malaise and fatigue 06/12/2013  . Nocturia 06/12/2013  . Cellulitis of leg, right 10/11/2012  . Morbid obesity ([place]) 08/09/2012  . Urinary incontinence, urge 07/11/2012  . CAD (coronary artery disease) 10/31/2010  . NEOPLASM OF UNCERTAIN BEHAVIOR OF SKIN 02/11/2010  . Bipolar disorder ([place]) 09/14/2009  . ACUT MYOCARD INFARCT UNS SITE SUBSQT EPIS CARE 07/02/2008  . EDEMA 06/23/2008  . Hyperlipidemia 05/23/2008  . UNSPECIFIED DISORDER OF THYROID 02/04/2008  . Obesity 02/04/2008  . HYPERTENSION, BENIGN ESSENTIAL 02/04/2008  . MYOCARDIAL INFARCTION, HX OF 03/05/2007    Past Medical History:  Diagnosis Date  . Anemia   . Anxiety   . Asthma   . Atrial fibrillation ([place])   . Bipolar disorder ([place])   . Cancer ([place])    basal cell carcinoma on right hand, papilloma left breast  . CHF (congestive heart failure) ([place])    pt seen at heart/vascular spec clinic 08/14/2014   . Coarse tremors    hands  . Coronary artery disease   .  Depression   . DVT (deep vein thrombosis) in pregnancy    pt. reports that it was post knee surgery, not during pregnancy  . Dyslipidemia   . Fall   . Fatty liver   . Headache    migraines - stopped after menopause  . Heart attack ([place]) 02/2007   non-q-wave with second septal perforator  . Hypertension   . Hypothyroidism    history of took medication, has resolved  . Knee pain, left   . Lower extremity deep venous thrombosis (HCC)    20 years ago   . Measles    hx of in childhood   . Morbid obesity with BMI of 40.0-44.9, adult ([place])   . Pain at surgical incision     Left breast from procedure 09/11/16  . Pneumonia    hx of   . PONV (postoperative nausea and vomiting)    also slow to wake up  . Psychiatric hospitalization 05/2008  . Shingles    20 years ago   . Shortness of breath dyspnea    exercise   . Type 2 diabetes mellitus ([place]) 12/31/2015  . Urinary incontinence     Past Surgical History:  Procedure Laterality Date  . BASAL CELL CARCINOMA EXCISION    . BREAST EXCISIONAL BIOPSY Left   . BREAST LUMPECTOMY WITH RADIOACTIVE SEED LOCALIZATION Left 09/11/2016   Procedure: LEFT BREAST LUMPECTOMY WITH RADIOACTIVE SEED LOCALIZATION;  Surgeon: Excell Seltzer, MD;  Location: [facility];  Service: General;  Laterality: Left;  . BREAST MASS EXCISION     age 21, benign tumor  . CARDIAC CATHETERIZATION    . CARDIAC CATHETERIZATION N/A 12/30/2015   Procedure: Left Heart Cath and Coronary Angiography;  Surgeon: Belva Crome, MD;  Location: [facility];  Service: Cardiovascular;  Laterality: N/A;  . COLONOSCOPY    . DILATATION & CURETTAGE/HYSTEROSCOPY WITH MYOSURE N/A 09/22/2016   Procedure: DILATATION & CURETTAGE/HYSTEROSCOPY WITH MYOSURE;  Surgeon: Molli Posey, MD;  Location: [facility];  Service: Gynecology;  Laterality: N/A;  . DILATION AND CURETTAGE OF UTERUS    . KNEE SURGERY     right, removed cartilage  . PARATHYROIDECTOMY N/A 08/25/2014   Procedure: PARATHYROIDECTOMY;  Surgeon: Armandina Gemma, MD;  Location: [facility];  Service: General;  Laterality: N/A;  . SHOULDER ARTHROSCOPY WITH ROTATOR CUFF REPAIR AND SUBACROMIAL DECOMPRESSION Right 02/27/2017   Procedure: Right shoulder arthroscopic rotator cuff repair with biceps tenodesis and subacromial decompression;  Surgeon: Nicholes Stairs, MD;  Location: [facility];  Service: Orthopedics;  Laterality: Right;  150 mins  . TONSILLECTOMY    . TUBAL LIGATION      Social History   Tobacco Use  . Smoking status: Never Smoker  . Smokeless tobacco: Never Used  Substance Use Topics  . Alcohol use: Yes     Alcohol/week: 0.0 standard drinks    Comment: occ.  . Drug use: No    Family History  Problem Relation Age of Onset  . Breast cancer Mother 35  . Alcohol abuse Mother   . Alzheimer's disease Mother   . Alcohol abuse Father   . Alzheimer's disease Father   . Parkinson's disease Father   . Alcohol abuse Maternal Grandfather   . Drug abuse Maternal Grandfather   . Alcohol abuse Maternal Grandmother   . Alcohol abuse Paternal Grandfather   . Alcohol abuse Paternal Grandmother   . Schizophrenia Cousin   . Diabetes Brother   . Hypertension Brother     Allergies  Allergen  Reactions  . Eggs Or Egg-Derived Products Hives and Other (See Comments)    Can eat foods with cooked eggs; CANNOT handle when part of flu vaccine, etc.   . Tetanus Toxoids     Vigorous local reaction to tdap with local pain and itching   . Lamictal [Lamotrigine] Hives and Rash    Medication list has been reviewed and updated.  Current Outpatient Medications on File Prior to Visit  Medication Sig Dispense Refill  . albuterol (PROVENTIL HFA;VENTOLIN HFA) 108 (90 BASE) MCG/ACT inhaler Inhale 2 puffs into the lungs every 6 (six) hours as needed for wheezing. 1 Inhaler 1  . allopurinol (ZYLOPRIM) 100 MG tablet TAKE 200 MG BY MOUTH EVERY DAY 60 tablet 6  . benztropine (COGENTIN) 1 MG tablet Take 1 mg by mouth 2 (two) times daily.  2  . divalproex (DEPAKOTE ER) 500 MG 24 hr tablet Take 1,500 mg by mouth at bedtime.     . furosemide (LASIX) 80 MG tablet TAKE 1 TAB DAILY. MAY ALSO TAKE 1/2 TAB AS NEEDED FOR FLUID. 45 tablet 3  . JARDIANCE 10 MG TABS tablet TAKE ONE TABLET BY MOUTH DAILY 90 tablet 5  . LATUDA 60 MG TABS Take 60 mg by mouth at bedtime.     [name] lisinopril (ZESTRIL) 20 MG tablet Take 1 tablet (20 mg total) by mouth daily. 90 tablet 1  . metFORMIN (GLUCOPHAGE) 500 MG tablet TAKE ONE TABLET BY MOUTH TWICE A DAY WITH MEALS 180 tablet 1  . metoprolol tartrate (LOPRESSOR) 25 MG tablet TAKE ONE TABLET BY MOUTH  TWICE A DAY 180 tablet 2  . nitroGLYCERIN (NITROSTAT) 0.4 MG SL tablet Place 1 tablet (0.4 mg total) under the tongue every 5 (five) minutes as needed for chest pain. For chest pain 25 tablet 3  . rosuvastatin (CRESTOR) 20 MG tablet TAKE ONE TABLET BY MOUTH DAILY 90 tablet 3  . triamcinolone ointment (KENALOG) 0.1 % APPLY TO AFFECTED AREA TWICE A DAY FOR 14 DAYS (Patient taking differently: Apply 1 application topically 2 (two) times daily as needed (for dry skin). ) 30 g 1  . triamcinolone ointment (KENALOG) 0.5 % Apply 1 application topically 2 (two) times daily. Use twice daily for spot on ankle as needed 30 g 1  . UNABLE TO FIND Med Name: One Touch Verio blood glucose meter Lot [id number] x Exp 01/22/2018    . XARELTO 20 MG TABS tablet TAKE ONE TABLET BY MOUTH AT BEDTIME 90 tablet 3   No current facility-administered medications on file prior to visit.     Review of Systems:  As per HPI- otherwise negative. No fever or chills   Physical Examination: Vitals:   10/14/18 1328 10/14/18 1343  BP: 126/86   Pulse: (!) 105 90  Resp: 18   Temp: (!) 97.4 F (36.3 C)   SpO2: 96%    Vitals:   10/14/18 1328  Weight: (!) 325 lb (147.4 kg)  Height: 6' (1.829 m)   Body mass index is 44.08 kg/m. Ideal Body Weight: Weight in (lb) to have BMI = 25: 183.9  GEN: WDWN, NAD, Non-toxic, A & O x 3, tall build, looks well  HEENT: Atraumatic, Normocephalic. Neck supple. No masses, No LAD. No cervical spine tenderness, normal cervical ROM  Ears and Nose: No external deformity. CV: RRR, No M/G/R. No JVD. No thrill. No extra heart sounds. PULM: CTA B, no wheezes, crackles, rhonchi. No retractions. No resp. distress. No accessory muscle use. ABD: S, NT, ND, +BS. No  rebound. No HSM. EXTR: No c/c/e NEURO Normal gait for pt. Uses a cane  PSYCH: Normally interactive. Conversant. Not depressed or anxious appearing.  Calm demeanor.  Left deltoid displays a localized vaccine reaction with a firm, indurated  and erythematous area about 5 cm in diameter.  It is tender and itchy No skin break down. No fluctuance No sign of angioedema of the mouth or throat Small skin tear left anterior shing  Assessment and Plan: Fall on or from stairs or steps, subsequent encounter - Plan: Ambulatory referral to Physical Therapy  Type 2 diabetes mellitus without complication, unspecified whether long term insulin use ([place]) - Plan: Hemoglobin [id number], Basic metabolic panel, CBC  Adverse effect of vaccine, initial encounter - Plan: cephALEXin (KEFLEX) 500 MG capsule  Skin tear of left lower leg without complication, initial encounter - Plan: cephALEXin (KEFLEX) 500 MG capsule  Chronic venous stasis dermatitis of both lower extremities  Here today to follow-up from recent fall Referral to PT to work on her balance My main concern is actually the vaccine reaction- start on keflex for this and also suggest ice, benadryl/ loratadine as needed for itching.  If any worsening she will seek care Keflex should also prevent any infection of her skin tear Check labs, A1c today   Signed Lamar Blinks, MD  Received her labs 9/22- message to pt Already referred to nephrology to est care   Results for orders placed or performed in visit on 10/14/18  Hemoglobin A1c  Result Value Ref Range   Hgb A1c MFr Bld 7.1 (H) 4.6 - 6.5 %  Basic metabolic panel  Result Value Ref Range   Sodium 142 135 - 145 mEq/L   Potassium 4.3 3.5 - 5.1 mEq/L   Chloride 102 96 - 112 mEq/L   CO2 29 19 - 32 mEq/L   Glucose, Bld 209 (H) 70 - 99 mg/dL   BUN 27 (H) 6 - 23 mg/dL   Creatinine, Ser 1.20 0.40 - 1.20 mg/dL   Calcium 9.5 8.4 - 10.5 mg/dL   GFR 45.37 (L) >60.00 mL/min  CBC  Result Value Ref Range   WBC 5.3 4.0 - 10.5 K/uL   RBC 4.86 3.87 - 5.11 Mil/uL   Platelets 123.0 (L) 150.0 - 400.0 K/uL   Hemoglobin 14.8 12.0 - 15.0 g/dL   HCT 44.1 36.0 - 46.0 %   MCV 90.8 78.0 - 100.0 fl   MCHC 33.5 30.0 - 36.0 g/dL   RDW 15.6 (H) 11.5 -  15.5 %

## 2018-10-14 NOTE — Patient Instructions (Signed)
Good to see you today but I am sorry that you fell!  We will use keflex three time a day for one week for your arm and the skin tear on your leg  Use caution the next time you need a tetanus vaccine  Please let me know if your arm/ leg are not getting better You can also try benadryl and/ or loratadine for itching of your arm  I will be in touch with your labs asap

## 2018-10-14 NOTE — Telephone Encounter (Signed)
Medication sent into pharmacy  

## 2018-10-15 ENCOUNTER — Encounter: Payer: Self-pay | Admitting: Family Medicine

## 2018-10-15 ENCOUNTER — Telehealth: Payer: Self-pay | Admitting: Family Medicine

## 2018-10-15 ENCOUNTER — Telehealth: Payer: Self-pay

## 2018-10-15 NOTE — Telephone Encounter (Signed)
[facility] called in to make provider aware they received referral for pt, they were assisting pt with getting set up at their [place] office but then pt told them that she received a call from [facility] stating that her visits would be  [phone], so pt is declining PT.    Please advise.   CB: [phone]

## 2018-10-15 NOTE — Telephone Encounter (Signed)
Medication has been resent in.

## 2018-10-15 NOTE — Telephone Encounter (Signed)
Copied from [place] [phone]. Topic: Quick Communication - Rx Refill/Question >> Oct 14, 2018  2:49 PM Erick Blinks wrote: Pt is at [facility] on [address] and they told her that they do not have her Keflex. Please advise  Receipt confirms they do

## 2018-10-17 ENCOUNTER — Ambulatory Visit
Admission: RE | Admit: 2018-10-17 | Discharge: 2018-10-17 | Disposition: A | Discharge status: Home / Self Care | Payer: BC Managed Care – PPO | Source: Ambulatory Visit | Attending: Physician Assistant | Admitting: Physician Assistant

## 2018-10-17 DIAGNOSIS — N183 Chronic kidney disease, stage 3 unspecified: Secondary | ICD-10-CM

## 2018-10-21 NOTE — Progress Notes (Signed)
[facility] [address], [place], [place] [address] [phone]  Date:  10/23/2018   Name:  Kathleen [name]   DOB:  1955-07-18   MRN:  [id number]  PCP:  Darreld Mclean, MD    Chief Complaint: Wound Check (follow up on wound)   History of Present Illness:  Kathleen [name] is a 63 y.o. very pleasant female patient who presents with the following: History of well controlled DM, venous stasis of both legs, a fib, hyperparathyroidism, CAD Here today with concern of a wound She was seen in the ER on September 17 following a fall on some slippery stairs, she hit her head I saw her for follow-up on September 21-at that point she had a localized vaccine reaction on the left deltoid, and a skin tear on her left shin  I put her on 7 days of Keflex 500 3 times daily, more for the vaccine reaction. She finished up her antibiotic about 3 days ago.  Over the last couple of days she feels as though the skin tear on her left shin is more red and tender  Lab Results  Component Value Date   HGBA1C 7.1 (H) 10/14/2018    She has started PT with [facility] to work on her strength and balance- she feels like it is helping so far  She has been to 2 sessions  Patient Active Problem List   Diagnosis Date Noted  . Vitamin D deficiency 07/22/2018  . Complete rotator cuff tear or rupture of right shoulder, not specified as traumatic 02/27/2017  . Auditory hallucination 12/13/2016  . Tremor of both hands 11/20/2016  . Gout 06/29/2016  . Type 2 diabetes mellitus ([place]) 12/31/2015  . Chest pain with moderate risk for cardiac etiology 12/29/2015  . Coronary artery dissection   . Pure hypercholesterolemia   . Lung nodule seen on imaging study 09/24/2014  . Paroxysmal atrial fibrillation ([place]) 09/11/2014  . Asthmatic bronchitis with exacerbation 05/25/2014  . Primary hyperparathyroidism ([place]) 04/15/2014  . Palpitations 03/20/2014  . Radicular  low back pain 11/24/2013  . Chronic diastolic heart failure ([place]) 10/02/2013  . Shortness of breath 08/31/2013  . Venous stasis dermatitis of both lower extremities 08/19/2013  . Peripheral edema 06/12/2013  . Other malaise and fatigue 06/12/2013  . Nocturia 06/12/2013  . Cellulitis of leg, right 10/11/2012  . Morbid obesity ([place]) 08/09/2012  . Urinary incontinence, urge 07/11/2012  . CAD (coronary artery disease) 10/31/2010  . NEOPLASM OF UNCERTAIN BEHAVIOR OF SKIN 02/11/2010  . Bipolar disorder ([place]) 09/14/2009  . ACUT MYOCARD INFARCT UNS SITE SUBSQT EPIS CARE 07/02/2008  . EDEMA 06/23/2008  . Hyperlipidemia 05/23/2008  . UNSPECIFIED DISORDER OF THYROID 02/04/2008  . Obesity 02/04/2008  . HYPERTENSION, BENIGN ESSENTIAL 02/04/2008  . MYOCARDIAL INFARCTION, HX OF 03/05/2007    Past Medical History:  Diagnosis Date  . Anemia   . Anxiety   . Asthma   . Atrial fibrillation ([place])   . Bipolar disorder ([place])   . Cancer ([place])    basal cell carcinoma on right hand, papilloma left breast  . CHF (congestive heart failure) ([place])    pt seen at heart/vascular spec clinic 08/14/2014   . Coarse tremors    hands  . Coronary artery disease   . Depression   . DVT (deep vein thrombosis) in pregnancy    pt. reports that it was post knee surgery, not during pregnancy  . Dyslipidemia   .  Fall   . Fatty liver   . Headache    migraines - stopped after menopause  . Heart attack ([place]) 02/2007   non-q-wave with second septal perforator  . Hypertension   . Hypothyroidism    history of took medication, has resolved  . Knee pain, left   . Lower extremity deep venous thrombosis (HCC)    20 years ago   . Measles    hx of in childhood   . Morbid obesity with BMI of 40.0-44.9, adult ([place])   . Pain at surgical incision    Left breast from procedure 09/11/16  . Pneumonia    hx of   . PONV (postoperative nausea and vomiting)    also slow to wake up  . Psychiatric hospitalization 05/2008  .  Shingles    20 years ago   . Shortness of breath dyspnea    exercise   . Type 2 diabetes mellitus ([place]) 12/31/2015  . Urinary incontinence     Past Surgical History:  Procedure Laterality Date  . BASAL CELL CARCINOMA EXCISION    . BREAST EXCISIONAL BIOPSY Left   . BREAST LUMPECTOMY WITH RADIOACTIVE SEED LOCALIZATION Left 09/11/2016   Procedure: LEFT BREAST LUMPECTOMY WITH RADIOACTIVE SEED LOCALIZATION;  Surgeon: Excell Seltzer, MD;  Location: [facility];  Service: General;  Laterality: Left;  . BREAST MASS EXCISION     age 16, benign tumor  . CARDIAC CATHETERIZATION    . CARDIAC CATHETERIZATION N/A 12/30/2015   Procedure: Left Heart Cath and Coronary Angiography;  Surgeon: Belva Crome, MD;  Location: [facility];  Service: Cardiovascular;  Laterality: N/A;  . COLONOSCOPY    . DILATATION & CURETTAGE/HYSTEROSCOPY WITH MYOSURE N/A 09/22/2016   Procedure: DILATATION & CURETTAGE/HYSTEROSCOPY WITH MYOSURE;  Surgeon: Molli Posey, MD;  Location: [facility];  Service: Gynecology;  Laterality: N/A;  . DILATION AND CURETTAGE OF UTERUS    . KNEE SURGERY     right, removed cartilage  . PARATHYROIDECTOMY N/A 08/25/2014   Procedure: PARATHYROIDECTOMY;  Surgeon: Armandina Gemma, MD;  Location: [facility];  Service: General;  Laterality: N/A;  . SHOULDER ARTHROSCOPY WITH ROTATOR CUFF REPAIR AND SUBACROMIAL DECOMPRESSION Right 02/27/2017   Procedure: Right shoulder arthroscopic rotator cuff repair with biceps tenodesis and subacromial decompression;  Surgeon: Nicholes Stairs, MD;  Location: [facility];  Service: Orthopedics;  Laterality: Right;  150 mins  . TONSILLECTOMY    . TUBAL LIGATION      Social History   Tobacco Use  . Smoking status: Never Smoker  . Smokeless tobacco: Never Used  Substance Use Topics  . Alcohol use: Yes    Alcohol/week: 0.0 standard drinks    Comment: occ.  . Drug use: No    Family History  Problem Relation Age of Onset  . Breast cancer Mother 42  . Alcohol abuse  Mother   . Alzheimer's disease Mother   . Alcohol abuse Father   . Alzheimer's disease Father   . Parkinson's disease Father   . Alcohol abuse Maternal Grandfather   . Drug abuse Maternal Grandfather   . Alcohol abuse Maternal Grandmother   . Alcohol abuse Paternal Grandfather   . Alcohol abuse Paternal Grandmother   . Schizophrenia Cousin   . Diabetes Brother   . Hypertension Brother     Allergies  Allergen Reactions  . Eggs Or Egg-Derived Products Hives and Other (See Comments)    Can eat foods with cooked eggs; CANNOT handle when part of flu vaccine, etc.   .  Tetanus Toxoids     Vigorous local reaction to tdap with local pain and itching   . Lamictal [Lamotrigine] Hives and Rash    Medication list has been reviewed and updated.  Current Outpatient Medications on File Prior to Visit  Medication Sig Dispense Refill  . albuterol (PROVENTIL HFA;VENTOLIN HFA) 108 (90 BASE) MCG/ACT inhaler Inhale 2 puffs into the lungs every 6 (six) hours as needed for wheezing. 1 Inhaler 1  . allopurinol (ZYLOPRIM) 100 MG tablet TAKE TWO TABLETS BY MOUTH DAILY 30 tablet 5  . benztropine (COGENTIN) 1 MG tablet Take 1 mg by mouth 2 (two) times daily.  2  . divalproex (DEPAKOTE ER) 500 MG 24 hr tablet Take 1,500 mg by mouth at bedtime.     . furosemide (LASIX) 80 MG tablet TAKE ONE TABLET BY MOUTH DAILY MAY ALSO TAKE ONE-HALF TABLET BY MOUTH AS NEEDED FOR FLUID 45 tablet 2  . JARDIANCE 10 MG TABS tablet TAKE ONE TABLET BY MOUTH DAILY 90 tablet 5  . LATUDA 60 MG TABS Take 60 mg by mouth at bedtime.     [name] lisinopril (ZESTRIL) 20 MG tablet Take 1 tablet (20 mg total) by mouth daily. 90 tablet 1  . metFORMIN (GLUCOPHAGE) 500 MG tablet TAKE ONE TABLET BY MOUTH TWICE A DAY WITH MEALS 180 tablet 1  . metoprolol tartrate (LOPRESSOR) 25 MG tablet TAKE ONE TABLET BY MOUTH TWICE A DAY 180 tablet 2  . nitroGLYCERIN (NITROSTAT) 0.4 MG SL tablet Place 1 tablet (0.4 mg total) under the tongue every 5 (five) minutes  as needed for chest pain. For chest pain 25 tablet 3  . rosuvastatin (CRESTOR) 20 MG tablet TAKE ONE TABLET BY MOUTH DAILY 90 tablet 3  . UNABLE TO FIND Med Name: One Touch Verio blood glucose meter Lot [id number] x Exp 01/22/2018    . XARELTO 20 MG TABS tablet TAKE ONE TABLET BY MOUTH AT BEDTIME 90 tablet 3   No current facility-administered medications on file prior to visit.     Review of Systems:  As per HPI- otherwise negative. No fever or chills  Physical Examination: Vitals:   10/23/18 0930  BP: 112/74  Pulse: 78  Resp: 18  Temp: (!) 96.5 F (35.8 C)  SpO2: 98%   Vitals:   10/23/18 0930  Weight: (!) 324 lb (147 kg)  Height: 6' (1.829 m)   Body mass index is 43.94 kg/m. Ideal Body Weight: Weight in (lb) to have BMI = 25: 183.9  GEN: WDWN, NAD, Non-toxic, A & O x 3, obese, looks well HEENT: Atraumatic, Normocephalic. Neck supple. No masses, No LAD. Ears and Nose: No external deformity. CV: RRR, No M/G/R. No JVD. No thrill. No extra heart sounds. PULM: CTA B, no wheezes, crackles, rhonchi. No retractions. No resp. distress. No accessory muscle use. EXTR: No c/c/e NEURO Normal gait.  PSYCH: Normally interactive. Conversant. Not depressed or anxious appearing.  Calm demeanor.  Vaccine reaction over left deltoid is nearly resolved.  There is still a small hard area palpable, but is much improved Healing skin tear on left anterior shin.  There is mild redness and warmth around the site, the patient states this is new over the last couple of days  Assessment and Plan: Adverse effect of vaccine, initial encounter - Plan: cephALEXin (KEFLEX) 500 MG capsule  Skin tear of left lower leg without complication, initial encounter - Plan: cephALEXin (KEFLEX) 500 MG capsule  Following up today on vaccine reaction and skin care.  She stopped  her antibiotic about 3 days ago, has had some increased redness around her leg wound.  Will extend her antibiotic regimen for another 5 days,  twice a day She is asked to keep me posted about her progress and let me know if any concerns  Signed Lamar Blinks, MD

## 2018-10-22 ENCOUNTER — Other Ambulatory Visit (HOSPITAL_COMMUNITY): Payer: Self-pay | Admitting: Internal Medicine

## 2018-10-22 ENCOUNTER — Other Ambulatory Visit: Payer: Self-pay | Admitting: Family Medicine

## 2018-10-23 ENCOUNTER — Encounter: Payer: Self-pay | Admitting: Family Medicine

## 2018-10-23 ENCOUNTER — Ambulatory Visit: Payer: BC Managed Care – PPO | Admitting: Family Medicine

## 2018-10-23 ENCOUNTER — Other Ambulatory Visit: Payer: Self-pay

## 2018-10-23 DIAGNOSIS — S81812A Laceration without foreign body, left lower leg, initial encounter: Secondary | ICD-10-CM

## 2018-10-23 DIAGNOSIS — T50Z95A Adverse effect of other vaccines and biological substances, initial encounter: Secondary | ICD-10-CM | POA: Diagnosis not present

## 2018-10-23 MED ORDER — CEPHALEXIN 500 MG PO CAPS: 500.0000 mg | ORAL_CAPSULE | Freq: Two times a day (BID) | ORAL | 0 refills | Status: DC

## 2018-10-23 NOTE — Patient Instructions (Signed)
Good to see you again today- I am glad that your arm is getting better I gave you another 5 days of keflex for your leg- take twice a day I hope that PT continues to work well for you

## 2018-11-16 ENCOUNTER — Other Ambulatory Visit: Payer: Self-pay | Admitting: Family Medicine

## 2018-11-18 ENCOUNTER — Encounter: Payer: Self-pay | Admitting: Family Medicine

## 2018-12-02 ENCOUNTER — Other Ambulatory Visit: Payer: Self-pay | Admitting: Family Medicine

## 2018-12-16 ENCOUNTER — Other Ambulatory Visit (HOSPITAL_COMMUNITY): Payer: Self-pay | Admitting: Internal Medicine

## 2018-12-26 ENCOUNTER — Other Ambulatory Visit: Payer: Self-pay | Admitting: Family Medicine

## 2018-12-27 ENCOUNTER — Telehealth (HOSPITAL_COMMUNITY): Payer: Self-pay | Admitting: *Deleted

## 2018-12-27 NOTE — Telephone Encounter (Signed)
Error

## 2018-12-29 ENCOUNTER — Encounter: Payer: Self-pay | Admitting: Family Medicine

## 2018-12-31 ENCOUNTER — Ambulatory Visit (HOSPITAL_COMMUNITY)
Admission: RE | Admit: 2018-12-31 | Discharge: 2018-12-31 | Disposition: A | Discharge status: Home / Self Care | Payer: BC Managed Care – PPO | Source: Ambulatory Visit | Attending: Internal Medicine | Admitting: Internal Medicine

## 2018-12-31 ENCOUNTER — Other Ambulatory Visit: Payer: Self-pay

## 2018-12-31 DIAGNOSIS — R002 Palpitations: Secondary | ICD-10-CM | POA: Diagnosis not present

## 2018-12-31 DIAGNOSIS — I5032 Chronic diastolic (congestive) heart failure: Secondary | ICD-10-CM

## 2018-12-31 DIAGNOSIS — E118 Type 2 diabetes mellitus with unspecified complications: Secondary | ICD-10-CM

## 2018-12-31 DIAGNOSIS — E119 Type 2 diabetes mellitus without complications: Secondary | ICD-10-CM | POA: Diagnosis not present

## 2018-12-31 DIAGNOSIS — I10 Essential (primary) hypertension: Secondary | ICD-10-CM | POA: Diagnosis not present

## 2018-12-31 NOTE — Progress Notes (Signed)
Added lasix 40 mg every am  Order and AVS mailed for BMET with Dr. Arlyn Dunning office next week  If CP recurs will need cath   f/u televisit in 4 months  (recall)

## 2018-12-31 NOTE — Progress Notes (Signed)
Heart Failure TeleHealth Note  Due to national recommendations of social distancing due to [place] 19, Audio/video telehealth visit is felt to be most appropriate for this patient at this time.  See MyChart message from today for patient consent regarding telehealth for [facility].  Date:  12/31/2018   ID:  Kathleen [name], DOB 10-30-1955, MRN [id number]  Location: Home  Provider location: [facility] Type of Visit: Established patient  PCP:  [name], [name], MD  Cardiologist:  No primary care provider on file. 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 Sleep study 11/2013 was negative. Also has history of A fib.    Had NSTEMI in 2/09 had cath at that time with occlusion of septal perforator. Otherwise normal arteries. Was admitted for CP in 11/13. CE normal. Underwent dobutamine stress echo which was normal.   Admitted 09/11/14 for Afib RVR. She converted to sinus tach with dilt drip and was transitioned to Toprol-XL 25 mg daily. She was in NSR at the time of discharge. Discharge weight was 325 lb.  Admitted 7/12-7/14/17 with chest pain. CTA negative for PE. Troponins negative.  Had Myoview 08/05/15 with "subtle" anterior wall defect. Felt to  be breast attenuation. Echo stable as below.   Holter monitor in 7/19: NSR with occasional PVCs and PACs. No other abnormalities. Multiple diary events which occasionally correspond to PVCs but often during NSR.   She presents via [employer], civil (consulting) for a telehealth visit today due to [place]. Says she is doing pretty well. Having more ankle edema lately. Mild increase DOE. Still able to do ADLs. Taking lasix 80 every evening and 40 mg about 2-3 mornings per week. Notes that when she is walking in store has to stop and  take breaks. Last week had an episode of CP and had to take NTG - which helped. No recurrent symptoms. This is first episode in years. Weight down 5 pounds - now 320. Does not have BP cuff that works.   Echo 7/19 EF 55-60% Grade 1 DD RVSP 23mmHG  Echo 11/13 EF 60-65% Mild LAE. RV normal. No PH. No comment on diastolic function.  Echo 09/04/13 EF 55-60%  Grade I DD Echo 08/05/15 EF 55-60%  Grade I DD   [name] denies symptoms worrisome for COVID 19.   Past Medical History:  Diagnosis Date  . Anemia   . Anxiety   . Asthma   . Atrial fibrillation ([employer])   . Bipolar disorder ([place])   . Cancer ([place])    basal cell carcinoma on right hand, papilloma left breast  . CHF (congestive heart failure) ([place])    pt seen at heart/vascular spec clinic 08/14/2014   . Coarse tremors    hands  . Coronary artery disease   . Depression   . DVT (deep vein thrombosis) in pregnancy    pt. reports that it was post knee surgery, not during pregnancy  . Dyslipidemia   . Fall   . Fatty liver   . Headache    migraines - stopped after menopause  . Heart attack ([place]) 02/2007   non-q-wave with second septal perforator  . Hypertension   . Hypothyroidism    history of took medication, has resolved  . Knee pain, left   .  Lower extremity deep venous thrombosis (HCC)    20 years ago   . Measles    hx of in childhood   . Morbid obesity with BMI of 40.0-44.9, adult ([place])   . Pain at surgical incision    Left breast from procedure 09/11/16  . Pneumonia    hx of   . PONV (postoperative nausea and vomiting)    also slow to wake up  . Psychiatric hospitalization 05/2008  . Shingles    20 years ago   . Shortness of breath dyspnea    exercise   . Type 2 diabetes mellitus ([place]) 12/31/2015  . Urinary incontinence    Past Surgical History:  Procedure Laterality Date  . BASAL CELL CARCINOMA EXCISION    . BREAST EXCISIONAL BIOPSY Left   . BREAST LUMPECTOMY WITH RADIOACTIVE SEED LOCALIZATION Left 09/11/2016    Procedure: LEFT BREAST LUMPECTOMY WITH RADIOACTIVE SEED LOCALIZATION;  Surgeon: Excell Seltzer, MD;  Location: [facility];  Service: General;  Laterality: Left;  . BREAST MASS EXCISION     age 47, benign tumor  . CARDIAC CATHETERIZATION    . CARDIAC CATHETERIZATION N/A 12/30/2015   Procedure: Left Heart Cath and Coronary Angiography;  Surgeon: Belva Crome, MD;  Location: [facility];  Service: Cardiovascular;  Laterality: N/A;  . COLONOSCOPY    . DILATATION & CURETTAGE/HYSTEROSCOPY WITH MYOSURE N/A 09/22/2016   Procedure: DILATATION & CURETTAGE/HYSTEROSCOPY WITH MYOSURE;  Surgeon: Molli Posey, MD;  Location: [facility];  Service: Gynecology;  Laterality: N/A;  . DILATION AND CURETTAGE OF UTERUS    . KNEE SURGERY     right, removed cartilage  . PARATHYROIDECTOMY N/A 08/25/2014   Procedure: PARATHYROIDECTOMY;  Surgeon: Armandina Gemma, MD;  Location: [facility];  Service: General;  Laterality: N/A;  . SHOULDER ARTHROSCOPY WITH ROTATOR CUFF REPAIR AND SUBACROMIAL DECOMPRESSION Right 02/27/2017   Procedure: Right shoulder arthroscopic rotator cuff repair with biceps tenodesis and subacromial decompression;  Surgeon: Nicholes Stairs, MD;  Location: [facility];  Service: Orthopedics;  Laterality: Right;  150 mins  . TONSILLECTOMY    . TUBAL LIGATION       Current Outpatient Medications  Medication Sig Dispense Refill  . albuterol (PROVENTIL HFA;VENTOLIN HFA) 108 (90 BASE) MCG/ACT inhaler Inhale 2 puffs into the lungs every 6 (six) hours as needed for wheezing. 1 Inhaler 1  . allopurinol (ZYLOPRIM) 100 MG tablet TAKE TWO TABLETS BY MOUTH DAILY 30 tablet 5  . benztropine (COGENTIN) 1 MG tablet Take 1 mg by mouth 2 (two) times daily.  2  . cephALEXin (KEFLEX) 500 MG capsule Take 1 capsule (500 mg total) by mouth 2 (two) times daily. 10 capsule 0  . divalproex (DEPAKOTE ER) 500 MG 24 hr tablet Take 1,500 mg by mouth at bedtime.     . furosemide (LASIX) 80 MG tablet TAKE ONE TABLET BY MOUTH DAILY; MAY  ALSO TAKE AN ADDITIONAL 1/2 TABLET BY MOUTH AS NEEDED FOR FLUID 46 tablet 1  . JARDIANCE 10 MG TABS tablet TAKE ONE TABLET BY MOUTH DAILY 90 tablet 5  . LATUDA 60 MG TABS Take 60 mg by mouth at bedtime.     [name] lisinopril (ZESTRIL) 20 MG tablet TAKE ONE TABLET BY MOUTH DAILY 90 tablet 1  . metFORMIN (GLUCOPHAGE) 500 MG tablet TAKE ONE TABLET BY MOUTH TWICE A DAY WITH MEALS 180 tablet 0  . metoprolol tartrate (LOPRESSOR) 25 MG tablet TAKE ONE TABLET BY MOUTH TWICE A DAY 180 tablet 2  . nitroGLYCERIN (NITROSTAT)  0.4 MG SL tablet Place 1 tablet (0.4 mg total) under the tongue every 5 (five) minutes as needed for chest pain. For chest pain 25 tablet 3  . rosuvastatin (CRESTOR) 20 MG tablet TAKE ONE TABLET BY MOUTH DAILY 63 tablet 2  . UNABLE TO FIND Med Name: One Touch Verio blood glucose meter Lot [id number] x Exp 01/22/2018    . XARELTO 20 MG TABS tablet TAKE ONE TABLET BY MOUTH AT BEDTIME 90 tablet 3   No current facility-administered medications for this encounter.     Allergies:   Eggs or egg-derived products, Tetanus toxoids, and Lamictal [lamotrigine]   Social History:  The patient  reports that she has never smoked. She has never used smokeless tobacco. She reports current alcohol use. She reports that she does not use drugs.   Family History:  The patient's family history includes Alcohol abuse in her father, maternal grandfather, maternal grandmother, mother, paternal grandfather, and paternal grandmother; Alzheimer's disease in her father and mother; Breast cancer (age of onset: 50) in her mother; Diabetes in her brother; Drug abuse in her maternal grandfather; Hypertension in her brother; Parkinson's disease in her father; Schizophrenia in her cousin.   ROS:  Please see the history of present illness.   All other systems are personally reviewed and negative.   Exam:  (Video/Tele Health Call; Exam is subjective and or/visual.) General:  Speaks in full sentences. No resp difficulty.  Lungs: Normal respiratory effort with conversation.  Abdomen: Obese Non-distended per patient report Extremities: Pt endorses mild lower extremity edema. Neuro: Alert & oriented x 3.   Recent Labs: 07/08/2018: ALT 28 10/14/2018: BUN 27; Creatinine, Ser 1.20; Hemoglobin 14.8; Platelets 123.0; Potassium 4.3; Sodium 142  Personally reviewed   Wt Readings from Last 3 Encounters:  10/23/18 (!) 147 kg (324 lb)  10/14/18 (!) 147.4 kg (325 lb)  10/10/18 (!) 147.4 kg (325 lb)      ASSESSMENT AND PLAN:  1. Chronic diastolic HF:  -ECHO [id number] EF Normal EF 55-60% Grade IDD  - Echo 7/19 EF 55-60% Personally reviewed - Volume status is elevated will add lasix 40mg  every morning - not on spiro due to elevated K in past (4.7) - will check labs 1 week with Dr. Edilia Bo  2. PAF - CHADSVASC = 3. - In NSR. Continue bb and xarelto. - No bleding with Xarelto    3. Morbid obesity: Body mass index is 44.4 kg/m.  4. CAD:  - Did have 1 episode of CP last week. If recurs would likely need f/u cath - Continue ASA 81, Plavix, statin.    5. HTN:   - She does not have BP cuff at home  - Sees Dr. Edilia Bo next week   6. Snoring:  - No OSA on sleep study.   7. Hyperparathyroidism-   - s/p partial parathyroidectomy 08/2014. Per PCP.   8. Palpitations - Holter monitor 7/19 with PACs and PVCs no sustained arryhythmias - Improved  9. DM2 - Continue Jardiance  COVID screen The patient does not have any symptoms that suggest any further testing/ screening at this time.  Social distancing reinforced today.  Recommended follow-up:  As above  Relevant cardiac medications were reviewed at length with the patient today.   The patient does not have concerns regarding their medications at this time.   The following changes were made today:  As above  Today, I have spent 16 minutes with the patient with telehealth technology discussing the above issues .  Signed, Glori Bickers, MD   12/31/2018 11:13 AM  [facility] [place] [address] [place] [address] [phone] (office) [phone] (fax)

## 2018-12-31 NOTE — Addendum Note (Signed)
Encounter addended by: Harvie Junior, CMA on: 12/31/2018 11:39 AM  Actions taken: Clinical Note Signed

## 2018-12-31 NOTE — Patient Instructions (Addendum)
Take lasix 40 mg every am.  Order placed  for BMET with Dr. Arlyn Dunning office next week    If Chest Pain recurs you will need to have a cath.    Follow up televisit in 4 months. (please call our office in January to schedule your April appointment)

## 2019-01-02 ENCOUNTER — Encounter: Payer: Self-pay | Admitting: Family Medicine

## 2019-01-07 NOTE — Progress Notes (Deleted)
[place] at [facility] [address], [place], [place] [address] [id number] [phone]  Date:  01/08/2019   Name:  Kathleen [name]   DOB:  July 21, 1955   MRN:  [id number]  PCP:  Darreld Mclean, MD    Chief Complaint: No chief complaint on file.   History of Present Illness:  Kathleen [name] is a 63 y.o. very pleasant female patient who presents with the following:  Here today for a 37-month follow-up visit History of diabetes, hyperparathyroidism status post parathyroidectomy, hyperlipidemia, CAD, atrial fib, CHF, history of NSTEMI 2009, mood disorder  She had a virtual visit with her heart failure cardiologist last week: ASSESSMENT AND PLAN: 1. Chronic diastolic HF:  -ECHO [id number] EF Normal EF 55-60% Grade IDD  - Echo 7/19 EF 55-60% Personally reviewed - Volume status is elevated will add lasix 40mg  every morning - not on spiro due to elevated K in past (4.7) - will check labs 1 week with Dr. Edilia Bo 2. PAF - CHADSVASC = 3. - In NSR. Continue bb and xarelto. - No bleding with Xarelto  3. Morbid obesity: Body mass index is 44.4 kg/m. 4. CAD:  - Did have 1 episode of CP last week. If recurs would likely need f/u cath - Continue ASA 81, Plavix, statin. 5. HTN:  - She does not have BP cuff at home  - Sees Dr. Edilia Bo next week  6. Snoring:  - No OSA on sleep study.  7. Hyperparathyroidism-  - s/p partial parathyroidectomy 08/2014. Per PCP.  8. Palpitations - Holter monitor 7/19 with PACs and PVCs no sustained arryhythmias - Improved 9. DM2 - Continue Jardiance  needs foot exam today Lab Results  Component Value Date   HGBA1C 7.1 (H) 10/14/2018   Allopurinol Cogentin Latuda Depakote Lasix 80 daily, may take additional 40 if needed Jardiance 10 Metformin Lisinopril 20 Metoprolol Crestor Xarelto  Patient Active Problem List   Diagnosis Date Noted  . Vitamin D deficiency 07/22/2018  . Complete rotator cuff tear  or rupture of right shoulder, not specified as traumatic 02/27/2017  . Auditory hallucination 12/13/2016  . Tremor of both hands 11/20/2016  . Gout 06/29/2016  . Type 2 diabetes mellitus ([place]) 12/31/2015  . Chest pain with moderate risk for cardiac etiology 12/29/2015  . Coronary artery dissection   . Pure hypercholesterolemia   . Lung nodule seen on imaging study 09/24/2014  . Paroxysmal atrial fibrillation ([place]) 09/11/2014  . Asthmatic bronchitis with exacerbation 05/25/2014  . Primary hyperparathyroidism ([place]) 04/15/2014  . Palpitations 03/20/2014  . Radicular low back pain 11/24/2013  . Chronic diastolic heart failure ([place]) 10/02/2013  . Shortness of breath 08/31/2013  . Venous stasis dermatitis of both lower extremities 08/19/2013  . Peripheral edema 06/12/2013  . Other malaise and fatigue 06/12/2013  . Nocturia 06/12/2013  . Cellulitis of leg, right 10/11/2012  . Morbid obesity ([place]) 08/09/2012  . Urinary incontinence, urge 07/11/2012  . CAD (coronary artery disease) 10/31/2010  . NEOPLASM OF UNCERTAIN BEHAVIOR OF SKIN 02/11/2010  . Bipolar disorder ([place]) 09/14/2009  . ACUT MYOCARD INFARCT UNS SITE SUBSQT EPIS CARE 07/02/2008  . EDEMA 06/23/2008  . Hyperlipidemia 05/23/2008  . UNSPECIFIED DISORDER OF THYROID 02/04/2008  . Obesity 02/04/2008  . HYPERTENSION, BENIGN ESSENTIAL 02/04/2008  . MYOCARDIAL INFARCTION, HX OF 03/05/2007    Past Medical History:  Diagnosis Date  . Anemia   . Anxiety   . Asthma   . Atrial fibrillation ([place])   .  Bipolar disorder ([place])   . Cancer ([place])    basal cell carcinoma on right hand, papilloma left breast  . CHF (congestive heart failure) ([place])    pt seen at heart/vascular spec clinic 08/14/2014   . Coarse tremors    hands  . Coronary artery disease   . Depression   . DVT (deep vein thrombosis) in pregnancy    pt. reports that it was post knee surgery, not during pregnancy  . Dyslipidemia   . Fall   . Fatty liver   . Headache     migraines - stopped after menopause  . Heart attack ([place]) 02/2007   non-q-wave with second septal perforator  . Hypertension   . Hypothyroidism    history of took medication, has resolved  . Knee pain, left   . Lower extremity deep venous thrombosis (HCC)    20 years ago   . Measles    hx of in childhood   . Morbid obesity with BMI of 40.0-44.9, adult ([place])   . Pain at surgical incision    Left breast from procedure 09/11/16  . Pneumonia    hx of   . PONV (postoperative nausea and vomiting)    also slow to wake up  . Psychiatric hospitalization 05/2008  . Shingles    20 years ago   . Shortness of breath dyspnea    exercise   . Type 2 diabetes mellitus ([place]) 12/31/2015  . Urinary incontinence     Past Surgical History:  Procedure Laterality Date  . BASAL CELL CARCINOMA EXCISION    . BREAST EXCISIONAL BIOPSY Left   . BREAST LUMPECTOMY WITH RADIOACTIVE SEED LOCALIZATION Left 09/11/2016   Procedure: LEFT BREAST LUMPECTOMY WITH RADIOACTIVE SEED LOCALIZATION;  Surgeon: Excell Seltzer, MD;  Location: [facility];  Service: General;  Laterality: Left;  . BREAST MASS EXCISION     age 71, benign tumor  . CARDIAC CATHETERIZATION    . CARDIAC CATHETERIZATION N/A 12/30/2015   Procedure: Left Heart Cath and Coronary Angiography;  Surgeon: Belva Crome, MD;  Location: [facility];  Service: Cardiovascular;  Laterality: N/A;  . COLONOSCOPY    . DILATATION & CURETTAGE/HYSTEROSCOPY WITH MYOSURE N/A 09/22/2016   Procedure: DILATATION & CURETTAGE/HYSTEROSCOPY WITH MYOSURE;  Surgeon: Molli Posey, MD;  Location: [facility];  Service: Gynecology;  Laterality: N/A;  . DILATION AND CURETTAGE OF UTERUS    . KNEE SURGERY     right, removed cartilage  . PARATHYROIDECTOMY N/A 08/25/2014   Procedure: PARATHYROIDECTOMY;  Surgeon: Armandina Gemma, MD;  Location: [facility];  Service: General;  Laterality: N/A;  . SHOULDER ARTHROSCOPY WITH ROTATOR CUFF REPAIR AND SUBACROMIAL DECOMPRESSION Right 02/27/2017    Procedure: Right shoulder arthroscopic rotator cuff repair with biceps tenodesis and subacromial decompression;  Surgeon: Nicholes Stairs, MD;  Location: [facility];  Service: Orthopedics;  Laterality: Right;  150 mins  . TONSILLECTOMY    . TUBAL LIGATION      Social History   Tobacco Use  . Smoking status: Never Smoker  . Smokeless tobacco: Never Used  Substance Use Topics  . Alcohol use: Yes    Alcohol/week: 0.0 standard drinks    Comment: occ.  . Drug use: No    Family History  Problem Relation Age of Onset  . Breast cancer Mother 68  . Alcohol abuse Mother   . Alzheimer's disease Mother   . Alcohol abuse Father   . Alzheimer's disease Father   . Parkinson's disease Father   .  Alcohol abuse Maternal Grandfather   . Drug abuse Maternal Grandfather   . Alcohol abuse Maternal Grandmother   . Alcohol abuse Paternal Grandfather   . Alcohol abuse Paternal Grandmother   . Schizophrenia Cousin   . Diabetes Brother   . Hypertension Brother     Allergies  Allergen Reactions  . Eggs Or Egg-Derived Products Hives and Other (See Comments)    Can eat foods with cooked eggs; CANNOT handle when part of flu vaccine, etc.   . Tetanus Toxoids     Vigorous local reaction to tdap with local pain and itching   . Lamictal [Lamotrigine] Hives and Rash    Medication list has been reviewed and updated.  Current Outpatient Medications on File Prior to Visit  Medication Sig Dispense Refill  . albuterol (PROVENTIL HFA;VENTOLIN HFA) 108 (90 BASE) MCG/ACT inhaler Inhale 2 puffs into the lungs every 6 (six) hours as needed for wheezing. 1 Inhaler 1  . allopurinol (ZYLOPRIM) 100 MG tablet TAKE TWO TABLETS BY MOUTH DAILY 30 tablet 5  . benztropine (COGENTIN) 1 MG tablet Take 1 mg by mouth 2 (two) times daily.  2  . cephALEXin (KEFLEX) 500 MG capsule Take 1 capsule (500 mg total) by mouth 2 (two) times daily. 10 capsule 0  . divalproex (DEPAKOTE ER) 500 MG 24 hr tablet Take 1,500 mg by mouth at  bedtime.     . furosemide (LASIX) 80 MG tablet TAKE ONE TABLET BY MOUTH DAILY; MAY ALSO TAKE AN ADDITIONAL 1/2 TABLET BY MOUTH AS NEEDED FOR FLUID 46 tablet 1  . JARDIANCE 10 MG TABS tablet TAKE ONE TABLET BY MOUTH DAILY 90 tablet 5  . LATUDA 60 MG TABS Take 60 mg by mouth at bedtime.     [name] lisinopril (ZESTRIL) 20 MG tablet TAKE ONE TABLET BY MOUTH DAILY 90 tablet 1  . metFORMIN (GLUCOPHAGE) 500 MG tablet TAKE ONE TABLET BY MOUTH TWICE A DAY WITH MEALS 180 tablet 0  . metoprolol tartrate (LOPRESSOR) 25 MG tablet TAKE ONE TABLET BY MOUTH TWICE A DAY 180 tablet 2  . nitroGLYCERIN (NITROSTAT) 0.4 MG SL tablet Place 1 tablet (0.4 mg total) under the tongue every 5 (five) minutes as needed for chest pain. For chest pain 25 tablet 3  . rosuvastatin (CRESTOR) 20 MG tablet TAKE ONE TABLET BY MOUTH DAILY 63 tablet 2  . UNABLE TO FIND Med Name: One Touch Verio blood glucose meter Lot [id number] x Exp 01/22/2018    . XARELTO 20 MG TABS tablet TAKE ONE TABLET BY MOUTH AT BEDTIME 90 tablet 3   No current facility-administered medications on file prior to visit.    Review of Systems:  As per HPI- otherwise negative.   Physical Examination: There were no vitals filed for this visit. There were no vitals filed for this visit. There is no height or weight on file to calculate BMI. Ideal Body Weight:    GEN: WDWN, NAD, Non-toxic, A & O x 3 HEENT: Atraumatic, Normocephalic. Neck supple. No masses, No LAD. Ears and Nose: No external deformity. CV: RRR, No M/G/R. No JVD. No thrill. No extra heart sounds. PULM: CTA B, no wheezes, crackles, rhonchi. No retractions. No resp. distress. No accessory muscle use. ABD: S, NT, ND, +BS. No rebound. No HSM. EXTR: No c/c/e NEURO Normal gait.  PSYCH: Normally interactive. Conversant. Not depressed or anxious appearing.  Calm demeanor.  Foot exam:   Assessment and Plan: *** This visit occurred during the SARS-CoV-2 public health emergency.  Safety protocols  were in place, including screening questions prior to the visit, additional usage of staff PPE, and extensive cleaning of exam room while observing appropriate contact time as indicated for disinfecting solutions.    Signed Lamar Blinks, MD

## 2019-01-08 ENCOUNTER — Ambulatory Visit: Payer: BC Managed Care – PPO | Admitting: Family Medicine

## 2019-01-10 ENCOUNTER — Other Ambulatory Visit: Payer: Self-pay

## 2019-01-10 NOTE — Progress Notes (Addendum)
[facility] [address], [place], [place] [address] [id number] [phone]  Date:  01/13/2019   Name:  Kathleen [name]   DOB:  15-Mar-1955   MRN:  [id number]  PCP:  Darreld Mclean, MD    Chief Complaint: No chief complaint on file.   History of Present Illness:  Kathleen [name] is a 63 y.o. very pleasant female patient who presents with the following:  Medically complex patient here today for a routine follow-up visit Virtual visit today due to pandemic- provider is at home, pt is at home Pt ID confirmed with 2 factors- she gives consent for virtual visit today  The patient and myself are present on the call today History of well-controlled diabetes, venous stasis both legs, atrial fibrillation, hyperparathyroidism, CAD I last saw her in September following a vaccine reaction  Foot exam is due Colon cancer screening due next year, consider Cologuard; she would like to use this option, will order for her  Her last colon looked ok and she was given 10-year follow-up Lab Results  Component Value Date   HGBA1C 7.1 (H) 10/14/2018   Allopurinol Cogentin Depakote Latuda Jardiance Lasix Lisinopril Metoprolol Metformin Crestor  Dr. Cruzita Lederer is her endocrinologist- last seen by her nearly a year ago- appt coming up though at the end of this month to recheck her parathyroid.  I will go ahead and get her PTH and thyroid levels today She is seeing Dr Posey Pronto with nephrology for CKD   Dr. [name] is her cardiologist-Per his note from earlier this month 1. Chronic diastolic HF:  -ECHO [id number] EF Normal EF 55-60% Grade IDD  - Echo 7/19 EF 55-60% Personally reviewed - Volume status is elevated will add lasix 40mg  every morning - not on spiro due to elevated K in past (4.7) - will check labs 1 week with Dr. Edilia Bo 2. PAF - CHADSVASC = 3. - In NSR. Continue bb and xarelto. - No bleding with Xarelto  3. Morbid obesity: Body  mass index is 44.4 kg/m. 4. CAD:  - Did have 1 episode of CP last week. If recurs would likely need f/u cath - Continue ASA 81, Plavix, statin. 5. HTN:  - She does not have BP cuff at home  - Sees Dr. Edilia Bo next week  6. Snoring:  - No OSA on sleep study.  7. Hyperparathyroidism-  - s/p partial parathyroidectomy 08/2014. Per PCP.  8. Palpitations - Holter monitor 7/19 with PACs and PVCs no sustained arryhythmias - Improved 9. DM2 - Continue Jardiance  She does not check her BP at home  She is seeing her eye doctor- Peter Garter- for double vision that developed about a month ago- they cannot determine the cause but do not think it is anything serious.  They have not suggested that she see an ophthalmologist.  They do want her to check her A1c which we are glad to do Patient Active Problem List   Diagnosis Date Noted  . Vitamin D deficiency 07/22/2018  . Complete rotator cuff tear or rupture of right shoulder, not specified as traumatic 02/27/2017  . Auditory hallucination 12/13/2016  . Tremor of both hands 11/20/2016  . Gout 06/29/2016  . Type 2 diabetes mellitus ([place]) 12/31/2015  . Chest pain with moderate risk for cardiac etiology 12/29/2015  . Coronary artery dissection   . Pure hypercholesterolemia   . Lung nodule seen on imaging study 09/24/2014  . Paroxysmal atrial fibrillation (HCC)  09/11/2014  . Asthmatic bronchitis with exacerbation 05/25/2014  . Primary hyperparathyroidism ([place]) 04/15/2014  . Palpitations 03/20/2014  . Radicular low back pain 11/24/2013  . Chronic diastolic heart failure ([place]) 10/02/2013  . Shortness of breath 08/31/2013  . Venous stasis dermatitis of both lower extremities 08/19/2013  . Peripheral edema 06/12/2013  . Other malaise and fatigue 06/12/2013  . Nocturia 06/12/2013  . Cellulitis of leg, right 10/11/2012  . Morbid obesity ([place]) 08/09/2012  . Urinary incontinence, urge 07/11/2012  . CAD (coronary artery disease) 10/31/2010  . NEOPLASM OF  UNCERTAIN BEHAVIOR OF SKIN 02/11/2010  . Bipolar disorder ([place]) 09/14/2009  . ACUT MYOCARD INFARCT UNS SITE SUBSQT EPIS CARE 07/02/2008  . EDEMA 06/23/2008  . Hyperlipidemia 05/23/2008  . UNSPECIFIED DISORDER OF THYROID 02/04/2008  . Obesity 02/04/2008  . HYPERTENSION, BENIGN ESSENTIAL 02/04/2008  . MYOCARDIAL INFARCTION, HX OF 03/05/2007    Past Medical History:  Diagnosis Date  . Anemia   . Anxiety   . Asthma   . Atrial fibrillation ([place])   . Bipolar disorder ([place])   . Cancer ([place])    basal cell carcinoma on right hand, papilloma left breast  . CHF (congestive heart failure) ([place])    pt seen at heart/vascular spec clinic 08/14/2014   . Coarse tremors    hands  . Coronary artery disease   . Depression   . DVT (deep vein thrombosis) in pregnancy    pt. reports that it was post knee surgery, not during pregnancy  . Dyslipidemia   . Fall   . Fatty liver   . Headache    migraines - stopped after menopause  . Heart attack ([place]) 02/2007   non-q-wave with second septal perforator  . Hypertension   . Hypothyroidism    history of took medication, has resolved  . Knee pain, left   . Lower extremity deep venous thrombosis (HCC)    20 years ago   . Measles    hx of in childhood   . Morbid obesity with BMI of 40.0-44.9, adult ([place])   . Pain at surgical incision    Left breast from procedure 09/11/16  . Pneumonia    hx of   . PONV (postoperative nausea and vomiting)    also slow to wake up  . Psychiatric hospitalization 05/2008  . Shingles    20 years ago   . Shortness of breath dyspnea    exercise   . Type 2 diabetes mellitus ([place]) 12/31/2015  . Urinary incontinence     Past Surgical History:  Procedure Laterality Date  . BASAL CELL CARCINOMA EXCISION    . BREAST EXCISIONAL BIOPSY Left   . BREAST LUMPECTOMY WITH RADIOACTIVE SEED LOCALIZATION Left 09/11/2016   Procedure: LEFT BREAST LUMPECTOMY WITH RADIOACTIVE SEED LOCALIZATION;  Surgeon: Excell Seltzer, MD;   Location: [facility];  Service: General;  Laterality: Left;  . BREAST MASS EXCISION     age 43, benign tumor  . CARDIAC CATHETERIZATION    . CARDIAC CATHETERIZATION N/A 12/30/2015   Procedure: Left Heart Cath and Coronary Angiography;  Surgeon: Belva Crome, MD;  Location: [facility];  Service: Cardiovascular;  Laterality: N/A;  . COLONOSCOPY    . DILATATION & CURETTAGE/HYSTEROSCOPY WITH MYOSURE N/A 09/22/2016   Procedure: DILATATION & CURETTAGE/HYSTEROSCOPY WITH MYOSURE;  Surgeon: Molli Posey, MD;  Location: [facility];  Service: Gynecology;  Laterality: N/A;  . DILATION AND CURETTAGE OF UTERUS    . KNEE SURGERY     right, removed cartilage  .  PARATHYROIDECTOMY N/A 08/25/2014   Procedure: PARATHYROIDECTOMY;  Surgeon: Armandina Gemma, MD;  Location: [facility];  Service: General;  Laterality: N/A;  . SHOULDER ARTHROSCOPY WITH ROTATOR CUFF REPAIR AND SUBACROMIAL DECOMPRESSION Right 02/27/2017   Procedure: Right shoulder arthroscopic rotator cuff repair with biceps tenodesis and subacromial decompression;  Surgeon: Nicholes Stairs, MD;  Location: [facility];  Service: Orthopedics;  Laterality: Right;  150 mins  . TONSILLECTOMY    . TUBAL LIGATION      Social History   Tobacco Use  . Smoking status: Never Smoker  . Smokeless tobacco: Never Used  Substance Use Topics  . Alcohol use: Yes    Alcohol/week: 0.0 standard drinks    Comment: occ.  . Drug use: No    Family History  Problem Relation Age of Onset  . Breast cancer Mother 42  . Alcohol abuse Mother   . Alzheimer's disease Mother   . Alcohol abuse Father   . Alzheimer's disease Father   . Parkinson's disease Father   . Alcohol abuse Maternal Grandfather   . Drug abuse Maternal Grandfather   . Alcohol abuse Maternal Grandmother   . Alcohol abuse Paternal Grandfather   . Alcohol abuse Paternal Grandmother   . Schizophrenia Cousin   . Diabetes Brother   . Hypertension Brother     Allergies  Allergen Reactions  . Eggs Or  Egg-Derived Products Hives and Other (See Comments)    Can eat foods with cooked eggs; CANNOT handle when part of flu vaccine, etc.   . Tetanus Toxoids     Vigorous local reaction to tdap with local pain and itching   . Lamictal [Lamotrigine] Hives and Rash    Medication list has been reviewed and updated.  Current Outpatient Medications on File Prior to Visit  Medication Sig Dispense Refill  . albuterol (PROVENTIL HFA;VENTOLIN HFA) 108 (90 BASE) MCG/ACT inhaler Inhale 2 puffs into the lungs every 6 (six) hours as needed for wheezing. 1 Inhaler 1  . allopurinol (ZYLOPRIM) 100 MG tablet TAKE TWO TABLETS BY MOUTH DAILY 30 tablet 5  . benztropine (COGENTIN) 1 MG tablet Take 1 mg by mouth 2 (two) times daily.  2  . divalproex (DEPAKOTE ER) 500 MG 24 hr tablet Take 1,500 mg by mouth at bedtime.     . furosemide (LASIX) 80 MG tablet TAKE ONE TABLET BY MOUTH DAILY; MAY ALSO TAKE AN ADDITIONAL 1/2 TABLET BY MOUTH AS NEEDED FOR FLUID 46 tablet 1  . JARDIANCE 10 MG TABS tablet TAKE ONE TABLET BY MOUTH DAILY 90 tablet 5  . LATUDA 60 MG TABS Take 60 mg by mouth at bedtime.     [name] lisinopril (ZESTRIL) 20 MG tablet TAKE ONE TABLET BY MOUTH DAILY 90 tablet 1  . metFORMIN (GLUCOPHAGE) 500 MG tablet TAKE ONE TABLET BY MOUTH TWICE A DAY WITH MEALS 180 tablet 0  . metoprolol tartrate (LOPRESSOR) 25 MG tablet TAKE ONE TABLET BY MOUTH TWICE A DAY 180 tablet 2  . nitroGLYCERIN (NITROSTAT) 0.4 MG SL tablet Place 1 tablet (0.4 mg total) under the tongue every 5 (five) minutes as needed for chest pain. For chest pain 25 tablet 3  . rosuvastatin (CRESTOR) 20 MG tablet TAKE ONE TABLET BY MOUTH DAILY 63 tablet 2  . UNABLE TO FIND Med Name: One Touch Verio blood glucose meter Lot [id number] x Exp 01/22/2018    . XARELTO 20 MG TABS tablet TAKE ONE TABLET BY MOUTH AT BEDTIME 90 tablet 3   No current facility-administered medications on  file prior to visit.    Review of Systems:  As per HPI- otherwise negative. Pt  has not been ill during the pandemic  Physical Examination: There were no vitals filed for this visit. There were no vitals filed for this visit. There is no height or weight on file to calculate BMI. Ideal Body Weight:    She is not checking any vitals at home  Patient observed over video, she looks well.  Her normal self, no cough, wheezing, distress is noted  Assessment and Plan: Type 2 diabetes mellitus without complication, unspecified whether long term insulin use ([place]) - Plan: Comprehensive metabolic panel, Hemoglobin A1c, empagliflozin (JARDIANCE) 10 MG TABS tablet, metFORMIN (GLUCOPHAGE) 500 MG tablet  Chronic venous stasis dermatitis of both lower extremities  Chronic renal impairment, unspecified CKD stage - Plan: Comprehensive metabolic panel, CBC  Coronary artery disease involving native coronary artery of native heart without angina pectoris  HYPERTENSION, BENIGN ESSENTIAL - Plan: metoprolol tartrate (LOPRESSOR) 25 MG tablet  Primary hyperparathyroidism ([place]) - Plan: PTH, intact (no Ca), TSH  Pure hypercholesterolemia - Plan: Lipid panel, rosuvastatin (CRESTOR) 20 MG tablet  Screening for colon cancer  Virtual follow-up visit today Labs pending as above, will get her signed up for a blood draw at her earliest convenience Dr. [name] would like to see her CMP results, other labs being drawn for her endocrinologist Refill medications today Order Cologuard Will plan further follow- up pending labs.  This visit occurred during the SARS-CoV-2 public health emergency.  Safety protocols were in place, including screening questions prior to the visit, additional usage of staff PPE, and extensive cleaning of exam room while observing appropriate contact time as indicated for disinfecting solutions.    Signed Lamar Blinks, MD

## 2019-01-12 ENCOUNTER — Encounter: Payer: Self-pay | Admitting: Family Medicine

## 2019-01-13 ENCOUNTER — Other Ambulatory Visit: Payer: Self-pay

## 2019-01-13 ENCOUNTER — Encounter: Payer: Self-pay | Admitting: Family Medicine

## 2019-01-13 ENCOUNTER — Ambulatory Visit (INDEPENDENT_AMBULATORY_CARE_PROVIDER_SITE_OTHER): Payer: BC Managed Care – PPO | Admitting: Family Medicine

## 2019-01-13 DIAGNOSIS — E119 Type 2 diabetes mellitus without complications: Secondary | ICD-10-CM

## 2019-01-13 DIAGNOSIS — I10 Essential (primary) hypertension: Secondary | ICD-10-CM

## 2019-01-13 DIAGNOSIS — I872 Venous insufficiency (chronic) (peripheral): Secondary | ICD-10-CM | POA: Diagnosis not present

## 2019-01-13 DIAGNOSIS — N183 Chronic kidney disease, stage 3 unspecified: Secondary | ICD-10-CM

## 2019-01-13 DIAGNOSIS — I2510 Atherosclerotic heart disease of native coronary artery without angina pectoris: Secondary | ICD-10-CM | POA: Diagnosis not present

## 2019-01-13 DIAGNOSIS — E7800 Pure hypercholesterolemia, unspecified: Secondary | ICD-10-CM

## 2019-01-13 DIAGNOSIS — N189 Chronic kidney disease, unspecified: Secondary | ICD-10-CM | POA: Diagnosis not present

## 2019-01-13 DIAGNOSIS — Z1211 Encounter for screening for malignant neoplasm of colon: Secondary | ICD-10-CM

## 2019-01-13 DIAGNOSIS — E210 Primary hyperparathyroidism: Secondary | ICD-10-CM

## 2019-01-13 MED ORDER — ROSUVASTATIN CALCIUM 20 MG PO TABS: 20.0000 mg | ORAL_TABLET | Freq: Every day | ORAL | 3 refills | Status: DC

## 2019-01-13 MED ORDER — METFORMIN HCL 500 MG PO TABS: 500.0000 mg | ORAL_TABLET | Freq: Two times a day (BID) | ORAL | 3 refills | Status: DC

## 2019-01-13 MED ORDER — METOPROLOL TARTRATE 25 MG PO TABS: 25.0000 mg | ORAL_TABLET | Freq: Two times a day (BID) | ORAL | 3 refills | Status: DC

## 2019-01-13 MED ORDER — JARDIANCE 10 MG PO TABS: 10.0000 mg | ORAL_TABLET | Freq: Every day | ORAL | 3 refills | Status: DC

## 2019-01-14 ENCOUNTER — Encounter: Payer: Self-pay | Admitting: Family Medicine

## 2019-01-14 DIAGNOSIS — N183 Chronic kidney disease, stage 3 unspecified: Secondary | ICD-10-CM | POA: Insufficient documentation

## 2019-01-15 ENCOUNTER — Other Ambulatory Visit: Payer: Self-pay

## 2019-01-15 ENCOUNTER — Encounter: Payer: Self-pay | Admitting: Family Medicine

## 2019-01-15 ENCOUNTER — Other Ambulatory Visit (INDEPENDENT_AMBULATORY_CARE_PROVIDER_SITE_OTHER): Payer: BC Managed Care – PPO

## 2019-01-15 DIAGNOSIS — I10 Essential (primary) hypertension: Secondary | ICD-10-CM

## 2019-01-15 LAB — BASIC METABOLIC PANEL
BUN: 26 mg/dL — ABNORMAL HIGH (ref 6–23)
CO2: 28 mEq/L (ref 19–32)
Calcium: 9 mg/dL (ref 8.4–10.5)
Chloride: 103 mEq/L (ref 96–112)
Creatinine, Ser: 1.46 mg/dL — ABNORMAL HIGH (ref 0.40–1.20)
GFR: 36.15 mL/min — ABNORMAL LOW (ref >60.00)
Glucose, Bld: 139 mg/dL — ABNORMAL HIGH (ref 70–99)
Potassium: 4.2 mEq/L (ref 3.5–5.1)
Sodium: 143 mEq/L (ref 135–145)

## 2019-01-16 ENCOUNTER — Encounter: Payer: Self-pay | Admitting: Family Medicine

## 2019-01-16 ENCOUNTER — Other Ambulatory Visit (INDEPENDENT_AMBULATORY_CARE_PROVIDER_SITE_OTHER): Payer: BC Managed Care – PPO

## 2019-01-16 DIAGNOSIS — E7800 Pure hypercholesterolemia, unspecified: Secondary | ICD-10-CM

## 2019-01-16 DIAGNOSIS — E210 Primary hyperparathyroidism: Secondary | ICD-10-CM | POA: Diagnosis not present

## 2019-01-16 LAB — LIPID PANEL
Cholesterol: 134 mg/dL (ref 0–200)
HDL: 42.3 mg/dL (ref >39.00)
LDL Cholesterol: 60 mg/dL (ref 0–99)
NonHDL: 91.48
Total CHOL/HDL Ratio: 3
Triglycerides: 158 mg/dL — ABNORMAL HIGH (ref 0.0–149.0)
VLDL: 31.6 mg/dL (ref 0.0–40.0)

## 2019-01-16 LAB — TSH: TSH: 4.66 u[IU]/mL — ABNORMAL HIGH (ref 0.35–4.50)

## 2019-01-18 ENCOUNTER — Encounter: Payer: Self-pay | Admitting: Family Medicine

## 2019-01-21 ENCOUNTER — Other Ambulatory Visit: Payer: Self-pay

## 2019-01-22 ENCOUNTER — Encounter: Payer: Self-pay | Admitting: Internal Medicine

## 2019-01-22 ENCOUNTER — Ambulatory Visit: Payer: BC Managed Care – PPO | Admitting: Internal Medicine

## 2019-01-22 VITALS — BP 122/78 | HR 77 | Ht 72.0 in | Wt 325.0 lb

## 2019-01-22 DIAGNOSIS — E559 Vitamin D deficiency, unspecified: Secondary | ICD-10-CM | POA: Diagnosis not present

## 2019-01-22 DIAGNOSIS — N1832 Chronic kidney disease, stage 3b: Secondary | ICD-10-CM | POA: Diagnosis not present

## 2019-01-22 DIAGNOSIS — E210 Primary hyperparathyroidism: Secondary | ICD-10-CM

## 2019-01-22 DIAGNOSIS — R7989 Other specified abnormal findings of blood chemistry: Secondary | ICD-10-CM

## 2019-01-22 DIAGNOSIS — E1121 Type 2 diabetes mellitus with diabetic nephropathy: Secondary | ICD-10-CM | POA: Diagnosis not present

## 2019-01-22 LAB — HEMOGLOBIN A1C: Hgb A1c MFr Bld: 7 % — ABNORMAL HIGH (ref 4.6–6.5)

## 2019-01-22 LAB — VITAMIN D 25 HYDROXY (VIT D DEFICIENCY, FRACTURES): VITD: 27.28 ng/mL — ABNORMAL LOW (ref 30.00–100.00)

## 2019-01-22 NOTE — Patient Instructions (Signed)
Please stop at the lab.  Please return to see me as needed. 
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
## 2019-01-27 ENCOUNTER — Encounter: Payer: Self-pay | Admitting: Family Medicine

## 2019-01-28 ENCOUNTER — Encounter: Payer: Self-pay | Admitting: Internal Medicine

## 2019-02-14 ENCOUNTER — Encounter: Payer: Self-pay | Admitting: Family Medicine

## 2019-02-15 ENCOUNTER — Other Ambulatory Visit: Payer: Self-pay | Admitting: Family Medicine

## 2019-02-18 ENCOUNTER — Other Ambulatory Visit: Payer: Self-pay | Admitting: Family Medicine

## 2019-02-24 ENCOUNTER — Ambulatory Visit: Payer: BC Managed Care – PPO | Attending: Internal Medicine

## 2019-02-24 ENCOUNTER — Encounter: Payer: Self-pay | Admitting: Family Medicine

## 2019-02-24 ENCOUNTER — Ambulatory Visit (INDEPENDENT_AMBULATORY_CARE_PROVIDER_SITE_OTHER): Payer: BC Managed Care – PPO | Admitting: Family Medicine

## 2019-02-24 ENCOUNTER — Other Ambulatory Visit: Payer: Self-pay

## 2019-02-24 DIAGNOSIS — R05 Cough: Secondary | ICD-10-CM

## 2019-02-24 DIAGNOSIS — Z20822 Contact with and (suspected) exposure to covid-19: Secondary | ICD-10-CM

## 2019-02-24 DIAGNOSIS — R059 Cough, unspecified: Secondary | ICD-10-CM

## 2019-02-24 MED ORDER — DOXYCYCLINE HYCLATE 100 MG PO CAPS: 100.0000 mg | ORAL_CAPSULE | Freq: Two times a day (BID) | ORAL | 0 refills | Status: DC

## 2019-02-24 NOTE — Progress Notes (Signed)
[place] at [facility] [address], [place], [place] [address] [phone]  Date:  02/24/2019   Name:  Kathleen [name]   DOB:  15-Jan-1956   MRN:  [id number]  PCP:  Darreld Mclean, MD    Chief Complaint: No chief complaint on file.   History of Present Illness:  Kathleen [name] is a 64 y.o. very pleasant female patient who presents with the following:  Virtual visit today due to illness Pt with history of CKD, DM, hyperlipidemia, venous stasis, a fib Last seen by myself in December of 202  Pt and myself are on the call today Pt ID confirmed with 2 factors, she gives consent for a virtual visit oday She notes that she either has "a cold or I have covid" She is coughing a bit- not a lot The cough is productive of green mucus HA, ST- temp of 100 this am No vomiting or diarrhea She does have aches and chills  She first noted her sx about a week ago Not getting worse, but also not better  She occasionally feels SOB She is not checking any other vitals except for her temp  She has noted some palpitations- she does have history of a fib No CP Taking xarelto   Patient Active Problem List   Diagnosis Date Noted  . Chronic kidney disease (CKD), stage III (moderate) 01/14/2019  . Vitamin D deficiency 07/22/2018  . Complete rotator cuff tear or rupture of right shoulder, not specified as traumatic 02/27/2017  . Auditory hallucination 12/13/2016  . Tremor of both hands 11/20/2016  . Gout 06/29/2016  . Type 2 diabetes mellitus ([place]) 12/31/2015  . Chest pain with moderate risk for cardiac etiology 12/29/2015  . Coronary artery dissection   . Pure hypercholesterolemia   . Lung nodule seen on imaging study 09/24/2014  . Paroxysmal atrial fibrillation ([place]) 09/11/2014  . Asthmatic bronchitis with exacerbation 05/25/2014  . Primary hyperparathyroidism ([place]) 04/15/2014  . Palpitations 03/20/2014  . Radicular low back pain  11/24/2013  . Chronic diastolic heart failure ([place]) 10/02/2013  . Shortness of breath 08/31/2013  . Venous stasis dermatitis of both lower extremities 08/19/2013  . Peripheral edema 06/12/2013  . Other malaise and fatigue 06/12/2013  . Nocturia 06/12/2013  . Cellulitis of leg, right 10/11/2012  . Morbid obesity ([place]) 08/09/2012  . Urinary incontinence, urge 07/11/2012  . CAD (coronary artery disease) 10/31/2010  . NEOPLASM OF UNCERTAIN BEHAVIOR OF SKIN 02/11/2010  . Bipolar disorder ([place]) 09/14/2009  . ACUT MYOCARD INFARCT UNS SITE SUBSQT EPIS CARE 07/02/2008  . EDEMA 06/23/2008  . Hyperlipidemia 05/23/2008  . UNSPECIFIED DISORDER OF THYROID 02/04/2008  . Obesity 02/04/2008  . HYPERTENSION, BENIGN ESSENTIAL 02/04/2008  . MYOCARDIAL INFARCTION, HX OF 03/05/2007    Past Medical History:  Diagnosis Date  . Anemia   . Anxiety   . Asthma   . Atrial fibrillation ([place])   . Bipolar disorder ([place])   . Cancer ([place])    basal cell carcinoma on right hand, papilloma left breast  . CHF (congestive heart failure) ([place])    pt seen at heart/vascular spec clinic 08/14/2014   . Coarse tremors    hands  . Coronary artery disease   . Depression   . DVT (deep vein thrombosis) in pregnancy    pt. reports that it was post knee surgery, not during pregnancy  . Dyslipidemia   . Fall   . Fatty liver   .  Headache    migraines - stopped after menopause  . Heart attack ([place]) 02/2007   non-q-wave with second septal perforator  . Hypertension   . Hypothyroidism    history of took medication, has resolved  . Knee pain, left   . Lower extremity deep venous thrombosis (HCC)    20 years ago   . Measles    hx of in childhood   . Morbid obesity with BMI of 40.0-44.9, adult ([place])   . Pain at surgical incision    Left breast from procedure 09/11/16  . Pneumonia    hx of   . PONV (postoperative nausea and vomiting)    also slow to wake up  . Psychiatric hospitalization 05/2008  . Shingles    20  years ago   . Shortness of breath dyspnea    exercise   . Type 2 diabetes mellitus ([place]) 12/31/2015  . Urinary incontinence     Past Surgical History:  Procedure Laterality Date  . BASAL CELL CARCINOMA EXCISION    . BREAST EXCISIONAL BIOPSY Left   . BREAST LUMPECTOMY WITH RADIOACTIVE SEED LOCALIZATION Left 09/11/2016   Procedure: LEFT BREAST LUMPECTOMY WITH RADIOACTIVE SEED LOCALIZATION;  Surgeon: Excell Seltzer, MD;  Location: [facility];  Service: General;  Laterality: Left;  . BREAST MASS EXCISION     age 76, benign tumor  . CARDIAC CATHETERIZATION    . CARDIAC CATHETERIZATION N/A 12/30/2015   Procedure: Left Heart Cath and Coronary Angiography;  Surgeon: Belva Crome, MD;  Location: [facility];  Service: Cardiovascular;  Laterality: N/A;  . COLONOSCOPY    . DILATATION & CURETTAGE/HYSTEROSCOPY WITH MYOSURE N/A 09/22/2016   Procedure: DILATATION & CURETTAGE/HYSTEROSCOPY WITH MYOSURE;  Surgeon: Molli Posey, MD;  Location: [facility];  Service: Gynecology;  Laterality: N/A;  . DILATION AND CURETTAGE OF UTERUS    . KNEE SURGERY     right, removed cartilage  . PARATHYROIDECTOMY N/A 08/25/2014   Procedure: PARATHYROIDECTOMY;  Surgeon: Armandina Gemma, MD;  [facility];  Service: General;  Laterality: N/A;  . SHOULDER ARTHROSCOPY WITH ROTATOR CUFF REPAIR AND SUBACROMIAL DECOMPRESSION Right 02/27/2017   Procedure: Right shoulder arthroscopic rotator cuff repair with biceps tenodesis and subacromial decompression;  Surgeon: Nicholes Stairs, MD;  Location: [facility];  Service: Orthopedics;  Laterality: Right;  150 mins  . TONSILLECTOMY    . TUBAL LIGATION      Social History   Tobacco Use  . Smoking status: Never Smoker  . Smokeless tobacco: Never Used  Substance Use Topics  . Alcohol use: Yes    Alcohol/week: 0.0 standard drinks    Comment: occ.  . Drug use: No    Family History  Problem Relation Age of Onset  . Breast cancer Mother 10  . Alcohol abuse Mother   .  Alzheimer's disease Mother   . Alcohol abuse Father   . Alzheimer's disease Father   . Parkinson's disease Father   . Alcohol abuse Maternal Grandfather   . Drug abuse Maternal Grandfather   . Alcohol abuse Maternal Grandmother   . Alcohol abuse Paternal Grandfather   . Alcohol abuse Paternal Grandmother   . Schizophrenia Cousin   . Diabetes Brother   . Hypertension Brother     Allergies  Allergen Reactions  . Eggs Or Egg-Derived Products Hives and Other (See Comments)    Can eat foods with cooked eggs; CANNOT handle when part of flu vaccine, etc.   . Tetanus Toxoids     Vigorous  local reaction to tdap with local pain and itching   . Lamictal [Lamotrigine] Hives and Rash    Medication list has been reviewed and updated.  Current Outpatient Medications on File Prior to Visit  Medication Sig Dispense Refill  . albuterol (PROVENTIL HFA;VENTOLIN HFA) 108 (90 BASE) MCG/ACT inhaler Inhale 2 puffs into the lungs every 6 (six) hours as needed for wheezing. 1 Inhaler 1  . allopurinol (ZYLOPRIM) 100 MG tablet TAKE TWO TABLETS BY MOUTH DAILY 30 tablet 5  . benztropine (COGENTIN) 1 MG tablet Take 1 mg by mouth 2 (two) times daily.  2  . divalproex (DEPAKOTE ER) 500 MG 24 hr tablet Take 1,500 mg by mouth at bedtime.     . empagliflozin (JARDIANCE) 10 MG TABS tablet Take 10 mg by mouth daily. 90 tablet 3  . furosemide (LASIX) 80 MG tablet TAKE ONE TABLET BY MOUTH DAILY; MAY ALSO TAKE AN ADDITIONAL 1/2 TABLET BY MOUTH AS NEEDED FOR FLUID 46 tablet 1  . LATUDA 60 MG TABS Take 60 mg by mouth at bedtime.     [name] lisinopril (ZESTRIL) 20 MG tablet TAKE ONE TABLET BY MOUTH DAILY 90 tablet 1  . metFORMIN (GLUCOPHAGE) 500 MG tablet Take 1 tablet (500 mg total) by mouth 2 (two) times daily with a meal. 180 tablet 3  . metoprolol tartrate (LOPRESSOR) 25 MG tablet Take 1 tablet (25 mg total) by mouth 2 (two) times daily. 180 tablet 3  . nitroGLYCERIN (NITROSTAT) 0.4 MG SL tablet Place 1 tablet (0.4 mg  total) under the tongue every 5 (five) minutes as needed for chest pain. For chest pain 25 tablet 3  . rosuvastatin (CRESTOR) 20 MG tablet Take 1 tablet (20 mg total) by mouth daily. 90 tablet 3  . UNABLE TO FIND Med Name: One Touch Verio blood glucose meter Lot [id number] x Exp 01/22/2018    . XARELTO 20 MG TABS tablet TAKE ONE TABLET BY MOUTH AT BEDTIME 90 tablet 3   No current facility-administered medications on file prior to visit.    Review of Systems:  As per HPI- otherwise negative.   Physical Examination: There were no vitals filed for this visit. There were no vitals filed for this visit. There is no height or weight on file to calculate BMI. Ideal Body Weight:    Spoke to pt on the phone She sounds her normal self No cough or SOB noted   Assessment and Plan: Cough - Plan: doxycycline (VIBRAMYCIN) 100 MG capsule  Virtual visit today with concern of cough and low grade fever.  Pt wonders if she might have covid 63 Advised her to be tested asap and explained testing procedure Called in doxycycline for her to use - if she is covid positive can stop use Urged to visit the ER if she is NOT doing ok- she is concerned about cost and declines to go for now  She will let me know what her covid test shows and if I can do anything else to help, if any worsening in her condition  Spoke with pt for 7 minutes on the phone today    Signed Lamar Blinks, MD

## 2019-02-25 ENCOUNTER — Encounter: Payer: Self-pay | Admitting: Family Medicine

## 2019-02-25 LAB — NOVEL CORONAVIRUS, NAA: SARS-CoV-2, NAA: NOT DETECTED

## 2019-02-27 ENCOUNTER — Telehealth (HOSPITAL_COMMUNITY): Payer: Self-pay | Admitting: *Deleted

## 2019-02-27 NOTE — Telephone Encounter (Signed)
Pt left VM late afternoon after 4pm on the triage line stating she had a lot of PVCs,shortness of breath, and was light headed. I called patient back this morning pt did not answer. I left VM requesting pt return my call.

## 2019-02-28 ENCOUNTER — Other Ambulatory Visit: Payer: Self-pay

## 2019-02-28 ENCOUNTER — Inpatient Hospital Stay (HOSPITAL_BASED_OUTPATIENT_CLINIC_OR_DEPARTMENT_OTHER)
Admission: EM | Admit: 2019-02-28 | Discharge: 2019-03-04 | DRG: 308 | Disposition: A | Discharge status: Home Health | Payer: BC Managed Care – PPO | Attending: Internal Medicine | Admitting: Internal Medicine

## 2019-02-28 ENCOUNTER — Emergency Department (HOSPITAL_BASED_OUTPATIENT_CLINIC_OR_DEPARTMENT_OTHER): Payer: BC Managed Care – PPO

## 2019-02-28 ENCOUNTER — Encounter (HOSPITAL_BASED_OUTPATIENT_CLINIC_OR_DEPARTMENT_OTHER): Payer: Self-pay | Admitting: *Deleted

## 2019-02-28 DIAGNOSIS — N1832 Chronic kidney disease, stage 3b: Secondary | ICD-10-CM | POA: Diagnosis present

## 2019-02-28 DIAGNOSIS — M109 Gout, unspecified: Secondary | ICD-10-CM | POA: Diagnosis present

## 2019-02-28 DIAGNOSIS — Z79899 Other long term (current) drug therapy: Secondary | ICD-10-CM

## 2019-02-28 DIAGNOSIS — I130 Hypertensive heart and chronic kidney disease with heart failure and stage 1 through stage 4 chronic kidney disease, or unspecified chronic kidney disease: Secondary | ICD-10-CM | POA: Diagnosis present

## 2019-02-28 DIAGNOSIS — F319 Bipolar disorder, unspecified: Secondary | ICD-10-CM | POA: Diagnosis present

## 2019-02-28 DIAGNOSIS — R0902 Hypoxemia: Secondary | ICD-10-CM

## 2019-02-28 DIAGNOSIS — I4891 Unspecified atrial fibrillation: Secondary | ICD-10-CM | POA: Diagnosis present

## 2019-02-28 DIAGNOSIS — Z20822 Contact with and (suspected) exposure to covid-19: Secondary | ICD-10-CM | POA: Diagnosis present

## 2019-02-28 DIAGNOSIS — Z8249 Family history of ischemic heart disease and other diseases of the circulatory system: Secondary | ICD-10-CM

## 2019-02-28 DIAGNOSIS — Z833 Family history of diabetes mellitus: Secondary | ICD-10-CM

## 2019-02-28 DIAGNOSIS — Z86718 Personal history of other venous thrombosis and embolism: Secondary | ICD-10-CM

## 2019-02-28 DIAGNOSIS — J45909 Unspecified asthma, uncomplicated: Secondary | ICD-10-CM | POA: Diagnosis present

## 2019-02-28 DIAGNOSIS — E785 Hyperlipidemia, unspecified: Secondary | ICD-10-CM | POA: Diagnosis present

## 2019-02-28 DIAGNOSIS — R091 Pleurisy: Secondary | ICD-10-CM | POA: Diagnosis present

## 2019-02-28 DIAGNOSIS — Z7984 Long term (current) use of oral hypoglycemic drugs: Secondary | ICD-10-CM

## 2019-02-28 DIAGNOSIS — Z85828 Personal history of other malignant neoplasm of skin: Secondary | ICD-10-CM

## 2019-02-28 DIAGNOSIS — N183 Chronic kidney disease, stage 3 unspecified: Secondary | ICD-10-CM | POA: Diagnosis present

## 2019-02-28 DIAGNOSIS — I480 Paroxysmal atrial fibrillation: Secondary | ICD-10-CM | POA: Diagnosis not present

## 2019-02-28 DIAGNOSIS — I2510 Atherosclerotic heart disease of native coronary artery without angina pectoris: Secondary | ICD-10-CM | POA: Diagnosis present

## 2019-02-28 DIAGNOSIS — I5032 Chronic diastolic (congestive) heart failure: Secondary | ICD-10-CM | POA: Diagnosis present

## 2019-02-28 DIAGNOSIS — J189 Pneumonia, unspecified organism: Secondary | ICD-10-CM | POA: Diagnosis present

## 2019-02-28 DIAGNOSIS — E039 Hypothyroidism, unspecified: Secondary | ICD-10-CM | POA: Diagnosis present

## 2019-02-28 DIAGNOSIS — G250 Essential tremor: Secondary | ICD-10-CM | POA: Diagnosis present

## 2019-02-28 DIAGNOSIS — K760 Fatty (change of) liver, not elsewhere classified: Secondary | ICD-10-CM | POA: Diagnosis present

## 2019-02-28 DIAGNOSIS — E1122 Type 2 diabetes mellitus with diabetic chronic kidney disease: Secondary | ICD-10-CM | POA: Diagnosis present

## 2019-02-28 DIAGNOSIS — I10 Essential (primary) hypertension: Secondary | ICD-10-CM | POA: Diagnosis present

## 2019-02-28 DIAGNOSIS — N179 Acute kidney failure, unspecified: Secondary | ICD-10-CM | POA: Diagnosis present

## 2019-02-28 DIAGNOSIS — Z7901 Long term (current) use of anticoagulants: Secondary | ICD-10-CM

## 2019-02-28 DIAGNOSIS — I252 Old myocardial infarction: Secondary | ICD-10-CM

## 2019-02-28 LAB — DIFFERENTIAL
Abs Immature Granulocytes: 0.02 10*3/uL (ref 0.00–0.07)
Basophils Absolute: 0 10*3/uL (ref 0.0–0.1)
Basophils Relative: 0 %
Eosinophils Absolute: 0 10*3/uL (ref 0.0–0.5)
Eosinophils Relative: 1 %
Immature Granulocytes: 0 %
Lymphocytes Relative: 18 %
Lymphs Abs: 1.3 10*3/uL (ref 0.7–4.0)
Monocytes Absolute: 0.7 10*3/uL (ref 0.1–1.0)
Monocytes Relative: 10 %
Neutro Abs: 4.9 10*3/uL (ref 1.7–7.7)
Neutrophils Relative %: 71 %

## 2019-02-28 LAB — COMPREHENSIVE METABOLIC PANEL
ALT: 23 U/L (ref 0–44)
AST: 21 U/L (ref 15–41)
Albumin: 3.9 g/dL (ref 3.5–5.0)
Alkaline Phosphatase: 120 U/L (ref 38–126)
Anion gap: 10 (ref 5–15)
BUN: 35 mg/dL — ABNORMAL HIGH (ref 8–23)
CO2: 28 mmol/L (ref 22–32)
Calcium: 8.9 mg/dL (ref 8.9–10.3)
Chloride: 98 mmol/L (ref 98–111)
Creatinine, Ser: 1.48 mg/dL — ABNORMAL HIGH (ref 0.44–1.00)
GFR calc Af Amer: 43 mL/min — ABNORMAL LOW (ref >60)
GFR calc non Af Amer: 37 mL/min — ABNORMAL LOW (ref >60)
Glucose, Bld: 153 mg/dL — ABNORMAL HIGH (ref 70–99)
Potassium: 4.1 mmol/L (ref 3.5–5.1)
Sodium: 136 mmol/L (ref 135–145)
Total Bilirubin: 0.8 mg/dL (ref 0.3–1.2)
Total Protein: 6.7 g/dL (ref 6.5–8.1)

## 2019-02-28 LAB — CBC
HCT: 46.4 % — ABNORMAL HIGH (ref 36.0–46.0)
Hemoglobin: 15.1 g/dL — ABNORMAL HIGH (ref 12.0–15.0)
MCH: 29.4 pg (ref 26.0–34.0)
MCHC: 32.5 g/dL (ref 30.0–36.0)
MCV: 90.3 fL (ref 80.0–100.0)
Platelets: 117 10*3/uL — ABNORMAL LOW (ref 150–400)
RBC: 5.14 MIL/uL — ABNORMAL HIGH (ref 3.87–5.11)
RDW: 14.1 % (ref 11.5–15.5)
WBC: 6.9 10*3/uL (ref 4.0–10.5)
nRBC: 0 % (ref 0.0–0.2)

## 2019-02-28 LAB — TROPONIN I (HIGH SENSITIVITY)
Troponin I (High Sensitivity): <2 ng/L (ref <18)
Troponin I (High Sensitivity): 2 ng/L (ref <18)

## 2019-02-28 LAB — COLOGUARD: Cologuard: POSITIVE — AB

## 2019-02-28 LAB — BRAIN NATRIURETIC PEPTIDE: B Natriuretic Peptide: 117.1 pg/mL — ABNORMAL HIGH (ref 0.0–100.0)

## 2019-02-28 LAB — LACTIC ACID, PLASMA: Lactic Acid, Venous: 1.4 mmol/L (ref 0.5–1.9)

## 2019-02-28 LAB — SARS CORONAVIRUS 2 BY RT PCR (HOSPITAL ORDER, PERFORMED IN ~~LOC~~ HOSPITAL LAB): SARS Coronavirus 2: NEGATIVE

## 2019-02-28 MED ORDER — METOPROLOL TARTRATE 5 MG/5ML IV SOLN
10.0000 mg | Freq: Once | INTRAVENOUS | Status: AC
  Administered 2019-02-28: 21:00:00 10 mg via INTRAVENOUS
  Filled 2019-02-28: qty 10

## 2019-02-28 MED ORDER — ETOMIDATE 2 MG/ML IV SOLN
10.0000 mg | Freq: Once | INTRAVENOUS | Status: AC
  Administered 2019-02-28: 22:00:00 12 mg via INTRAVENOUS
  Filled 2019-02-28: qty 10

## 2019-02-28 MED ORDER — LEVOFLOXACIN IN D5W 500 MG/100ML IV SOLN
500.0000 mg | Freq: Once | INTRAVENOUS | Status: AC
  Administered 2019-02-28: 20:00:00 500 mg via INTRAVENOUS
  Filled 2019-02-28: qty 100

## 2019-02-28 MED ORDER — DILTIAZEM HCL 25 MG/5ML IV SOLN
20.0000 mg | Freq: Once | INTRAVENOUS | Status: AC
  Administered 2019-02-28: 22:00:00 20 mg via INTRAVENOUS
  Filled 2019-02-28: qty 5

## 2019-02-28 MED ORDER — IOHEXOL 350 MG/ML SOLN
80.0000 mL | Freq: Once | INTRAVENOUS | Status: AC | PRN
  Administered 2019-02-28: 80 mL via INTRAVENOUS

## 2019-02-28 MED ORDER — DILTIAZEM HCL-DEXTROSE 125-5 MG/125ML-% IV SOLN (PREMIX)
5.0000 mg/h | INTRAVENOUS | Status: DC
  Administered 2019-02-28: 5 mg/h via INTRAVENOUS
  Administered 2019-03-01 (×2): 15 mg/h via INTRAVENOUS
  Filled 2019-02-28 (×3): qty 125

## 2019-02-28 NOTE — ED Notes (Signed)
Blood redrawn and taken to the lab.

## 2019-02-28 NOTE — ED Notes (Addendum)
Pt. Was assessed by EDP and a new EKG was done.  Pt. Is in no distress and new orders will be placed.

## 2019-02-28 NOTE — ED Triage Notes (Signed)
Last night she had burning in her lungs. Negative Covid test a week ago. Hx of pleurisy.

## 2019-02-28 NOTE — Plan of Care (Signed)
64 yo F with h/o CKD, DM, A.FIB.  In to ED today with A.Fib RVR, resistant to cardioversion in ED.  Now on cardizem gtt in ED.  Some question of low grade fever at home, had COVID test as outpt which was neg.  CTA chest: 1) bad contrast timing 2) pleural effusion with associated actelectasis, maybe consolidation in appropriate setting.  Got levaquin in ED.  Not really clear that she has PNA though.  I am informed: No SDU beds at [facility], but they do have SDU bed at [facility], so putting in for [facility] SDU status for cardizem gtt.

## 2019-02-28 NOTE — ED Provider Notes (Signed)
[facility] EMERGENCY DEPARTMENT Provider Note   CSN: [id number] Arrival date & time: 02/28/19  1630     History Chief Complaint  Patient presents with  . Shortness of Breath    Kathleen [name] is a 64 y.o. female hx of CHF, afib on xarelto, CAD, here presenting with right-sided chest pain, burning sensation in the right side of her lungs.  Patient states that 4 days ago, she has been having low-grade temperature and cough.  She tested negative for Covid.  Since last night she is burning sensation of her right side of her chest.  She states that is worse when she takes a deep breath and when she lays down.  Patient is concerned that she may have worsening heart failure or heart attack.  She states that her legs are swollen and that is stable.  Patient called her cardiologist and is sent here for evaluation.   The history is provided by the patient.       Past Medical History:  Diagnosis Date  . Anemia   . Anxiety   . Asthma   . Atrial fibrillation ([place])   . Bipolar disorder ([place])   . Cancer ([place])    basal cell carcinoma on right hand, papilloma left breast  . CHF (congestive heart failure) ([place])    pt seen at heart/vascular spec clinic 08/14/2014   . Coarse tremors    hands  . Coronary artery disease   . Depression   . DVT (deep vein thrombosis) in pregnancy    pt. reports that it was post knee surgery, not during pregnancy  . Dyslipidemia   . Fall   . Fatty liver   . Headache    migraines - stopped after menopause  . Heart attack ([place]) 02/2007   non-q-wave with second septal perforator  . Hypertension   . Hypothyroidism    history of took medication, has resolved  . Knee pain, left   . Lower extremity deep venous thrombosis (HCC)    20 years ago   . Measles    hx of in childhood   . Morbid obesity with BMI of 40.0-44.9, adult ([place])   . Pain at surgical incision    Left breast from procedure 09/11/16  . Pneumonia    hx of   . PONV (postoperative nausea  and vomiting)    also slow to wake up  . Psychiatric hospitalization 05/2008  . Shingles    20 years ago   . Shortness of breath dyspnea    exercise   . Type 2 diabetes mellitus ([place]) 12/31/2015  . Urinary incontinence     Patient Active Problem List   Diagnosis Date Noted  . Atrial fibrillation with RVR ([place]) 02/28/2019  . Chronic kidney disease (CKD), stage III (moderate) 01/14/2019  . Vitamin D deficiency 07/22/2018  . Complete rotator cuff tear or rupture of right shoulder, not specified as traumatic 02/27/2017  . Auditory hallucination 12/13/2016  . Tremor of both hands 11/20/2016  . Gout 06/29/2016  . Type 2 diabetes mellitus ([place]) 12/31/2015  . Chest pain with moderate risk for cardiac etiology 12/29/2015  . Coronary artery dissection   . Pure hypercholesterolemia   . Lung nodule seen on imaging study 09/24/2014  . Paroxysmal atrial fibrillation ([place]) 09/11/2014  . Asthmatic bronchitis with exacerbation 05/25/2014  . Primary hyperparathyroidism ([place]) 04/15/2014  . Palpitations 03/20/2014  . Radicular low back pain 11/24/2013  . Chronic diastolic heart failure ([place]) 10/02/2013  . Shortness of  breath 08/31/2013  . Venous stasis dermatitis of both lower extremities 08/19/2013  . Peripheral edema 06/12/2013  . Other malaise and fatigue 06/12/2013  . Nocturia 06/12/2013  . Cellulitis of leg, right 10/11/2012  . Morbid obesity ([place]) 08/09/2012  . Urinary incontinence, urge 07/11/2012  . CAD (coronary artery disease) 10/31/2010  . NEOPLASM OF UNCERTAIN BEHAVIOR OF SKIN 02/11/2010  . Bipolar disorder ([place]) 09/14/2009  . ACUT MYOCARD INFARCT UNS SITE SUBSQT EPIS CARE 07/02/2008  . EDEMA 06/23/2008  . Hyperlipidemia 05/23/2008  . UNSPECIFIED DISORDER OF THYROID 02/04/2008  . Obesity 02/04/2008  . HYPERTENSION, BENIGN ESSENTIAL 02/04/2008  . MYOCARDIAL INFARCTION, HX OF 03/05/2007    Past Surgical History:  Procedure Laterality Date  . BASAL CELL CARCINOMA EXCISION      . BREAST EXCISIONAL BIOPSY Left   . BREAST LUMPECTOMY WITH RADIOACTIVE SEED LOCALIZATION Left 09/11/2016   Procedure: LEFT BREAST LUMPECTOMY WITH RADIOACTIVE SEED LOCALIZATION;  Surgeon: Excell Seltzer, MD;  Location: [facility];  Service: General;  Laterality: Left;  . BREAST MASS EXCISION     age 53, benign tumor  . CARDIAC CATHETERIZATION    . CARDIAC CATHETERIZATION N/A 12/30/2015   Procedure: Left Heart Cath and Coronary Angiography;  Surgeon: Belva Crome, MD;  Location: [facility];  Service: Cardiovascular;  Laterality: N/A;  . COLONOSCOPY    . DILATATION & CURETTAGE/HYSTEROSCOPY WITH MYOSURE N/A 09/22/2016   Procedure: DILATATION & CURETTAGE/HYSTEROSCOPY WITH MYOSURE;  Surgeon: Molli Posey, MD;  Location: [facility];  Service: Gynecology;  Laterality: N/A;  . DILATION AND CURETTAGE OF UTERUS    . KNEE SURGERY     right, removed cartilage  . PARATHYROIDECTOMY N/A 08/25/2014   Procedure: PARATHYROIDECTOMY;  Surgeon: Armandina Gemma, MD;  Location: [facility];  Service: General;  Laterality: N/A;  . SHOULDER ARTHROSCOPY WITH ROTATOR CUFF REPAIR AND SUBACROMIAL DECOMPRESSION Right 02/27/2017   Procedure: Right shoulder arthroscopic rotator cuff repair with biceps tenodesis and subacromial decompression;  Surgeon: Nicholes Stairs, MD;  Location: [facility];  Service: Orthopedics;  Laterality: Right;  150 mins  . TONSILLECTOMY    . TUBAL LIGATION       OB History   No obstetric history on file.     Family History  Problem Relation Age of Onset  . Breast cancer Mother 39  . Alcohol abuse Mother   . Alzheimer's disease Mother   . Alcohol abuse Father   . Alzheimer's disease Father   . Parkinson's disease Father   . Alcohol abuse Maternal Grandfather   . Drug abuse Maternal Grandfather   . Alcohol abuse Maternal Grandmother   . Alcohol abuse Paternal Grandfather   . Alcohol abuse Paternal Grandmother   . Schizophrenia Cousin   . Diabetes Brother   . Hypertension Brother      Social History   Tobacco Use  . Smoking status: Never Smoker  . Smokeless tobacco: Never Used  Substance Use Topics  . Alcohol use: Yes    Alcohol/week: 0.0 standard drinks    Comment: occ.  . Drug use: No    Home Medications Prior to Admission medications   Medication Sig Start Date End Date Taking? Authorizing Provider  albuterol (PROVENTIL HFA;VENTOLIN HFA) 108 (90 BASE) MCG/ACT inhaler Inhale 2 puffs into the lungs every 6 (six) hours as needed for wheezing. 11/12/12   Ann Held, DO  allopurinol (ZYLOPRIM) 100 MG tablet TAKE TWO TABLETS BY MOUTH DAILY 02/19/19   [name], [name], MD  benztropine (COGENTIN) 1 MG  tablet Take 1 mg by mouth 2 (two) times daily. 01/14/17   [provider]  divalproex (DEPAKOTE ER) 500 MG 24 hr tablet Take 1,500 mg by mouth at bedtime.     [provider]  doxycycline (VIBRAMYCIN) 100 MG capsule Take 1 capsule (100 mg total) by mouth 2 (two) times daily. 02/24/19   [name], [name], MD  empagliflozin (JARDIANCE) 10 MG TABS tablet Take 10 mg by mouth daily. 01/13/19   [name], [name], MD  furosemide (LASIX) 80 MG tablet TAKE ONE TABLET BY MOUTH DAILY; MAY ALSO TAKE AN ADDITIONAL 1/2 TABLET BY MOUTH AS NEEDED FOR FLUID 12/16/18   Bensimhon, Shaune Pascal, MD  LATUDA 60 MG TABS Take 60 mg by mouth at bedtime.  03/08/16   [provider]  lisinopril (ZESTRIL) 20 MG tablet TAKE ONE TABLET BY MOUTH DAILY 11/18/18   [name], [name], MD  metFORMIN (GLUCOPHAGE) 500 MG tablet Take 1 tablet (500 mg total) by mouth 2 (two) times daily with a meal. 01/13/19   [name], [name], MD  metoprolol tartrate (LOPRESSOR) 25 MG tablet Take 1 tablet (25 mg total) by mouth 2 (two) times daily. 01/13/19   [name], [name], MD  nitroGLYCERIN (NITROSTAT) 0.4 MG SL tablet Place 1 tablet (0.4 mg total) under the tongue every 5 (five) minutes as needed for chest pain. For chest pain 12/07/11   Barrett, Evelene Croon, PA-C  rosuvastatin  (CRESTOR) 20 MG tablet Take 1 tablet (20 mg total) by mouth daily. 01/13/19   [name], [name], MD  UNABLE TO FIND Med Name: One Touch Verio blood glucose meter Lot [id number] x Exp 01/22/2018    [provider]  XARELTO 20 MG TABS tablet TAKE ONE TABLET BY MOUTH AT BEDTIME 09/16/18   Bensimhon, Shaune Pascal, MD    Allergies    Eggs or egg-derived products, Tetanus toxoids, and Lamictal [lamotrigine]  Review of Systems   Review of Systems  Respiratory: Positive for shortness of breath.   All other systems reviewed and are negative.   Physical Exam Updated Vital Signs BP 124/71   Pulse 68   Temp 97.6 F (36.4 C) (Oral)   Resp 20   Ht 6' (1.829 m)   Wt (!) 147 kg   SpO2 99%   BMI 43.94 kg/m   Physical Exam Vitals and nursing note reviewed.  HENT:     Head: Normocephalic.     Mouth/Throat:     Mouth: Mucous membranes are moist.  Eyes:     Pupils: Pupils are equal, round, and reactive to light.  Cardiovascular:     Rate and Rhythm: Normal rate and regular rhythm.  Pulmonary:     Effort: Pulmonary effort is normal.     Breath sounds: Normal breath sounds.  Abdominal:     General: Bowel sounds are normal.     Palpations: Abdomen is soft.  Musculoskeletal:        General: Normal range of motion.     Cervical back: Normal range of motion and neck supple.     Right lower leg: Edema present.     Left lower leg: Edema present.  Skin:    General: Skin is warm.  Neurological:     General: No focal deficit present.     Mental Status: She is alert and oriented to person, place, and time.  Psychiatric:        Mood and Affect: Mood normal.        Behavior: Behavior normal.  ED Results / Procedures / Treatments   Labs (all labs ordered are listed, but only abnormal results are displayed) Labs Reviewed  BRAIN NATRIURETIC PEPTIDE - Abnormal; Notable for the following components:      Result Value   B Natriuretic Peptide 117.1 (*)    All other components within  normal limits  CBC - Abnormal; Notable for the following components:   RBC 5.14 (*)    Hemoglobin 15.1 (*)    HCT 46.4 (*)    Platelets 117 (*)    All other components within normal limits  COMPREHENSIVE METABOLIC PANEL - Abnormal; Notable for the following components:   Glucose, Bld 153 (*)    BUN 35 (*)    Creatinine, Ser 1.48 (*)    GFR calc non Af Amer 37 (*)    GFR calc Af Amer 43 (*)    All other components within normal limits  CULTURE, BLOOD (ROUTINE X 2)  CULTURE, BLOOD (ROUTINE X 2)  SARS CORONAVIRUS 2 BY RT PCR (HOSPITAL ORDER, [facility])  DIFFERENTIAL  CBC WITH DIFFERENTIAL/PLATELET  LACTIC ACID, PLASMA  LACTIC ACID, PLASMA  TROPONIN I (HIGH SENSITIVITY)  TROPONIN I (HIGH SENSITIVITY)    EKG EKG Interpretation  Date/Time:  Friday February 28 2019 20:33:01 EST Ventricular Rate:  157 PR Interval:    QRS Duration: 82 QT Interval:  258 QTC Calculation: 428 R Axis:   61 Text Interpretation: Atrial fibrillation with rapid V-rate Ventricular premature complex Repolarization abnormality, prob rate related Since last tracing rate faster Confirmed by Wandra Arthurs [phone]) on 02/28/2019 8:55:13 PM   Radiology DG Chest 2 View  Result Date: 02/28/2019 CLINICAL DATA:  Shortness of breath EXAM: CHEST - 2 VIEW COMPARISON:  Radiograph 12/11/2016, CT 08/05/2015 FINDINGS: Stable bandlike opacity in the left lung base compatible with scarring. Hazy opacities in the right mid to infrahilar lung are new from comparison. No focally consolidative opacity, convincing features of edema, pneumothorax, or effusion i. Cardiomediastinal contours are stable. No acute osseous or soft tissue abnormality. IMPRESSION: Some hazy opacity in the right mid to infrahilar lung could reflect atelectasis or early infection in the appropriate clinical context. Stable scarring in the left lung base. Electronically Signed   By: Lovena Le M.D.   On: 02/28/2019 17:22   CT Angio  Chest PE W and/or Wo Contrast  Result Date: 02/28/2019 CLINICAL DATA:  Shortness of breath, bilateral leg swelling, history of DVT and PE EXAM: CT ANGIOGRAPHY CHEST WITH CONTRAST TECHNIQUE: Multidetector CT imaging of the chest was performed using the standard protocol during bolus administration of intravenous contrast. Multiplanar CT image reconstructions and MIPs were obtained to evaluate the vascular anatomy. CONTRAST:  80mL OMNIPAQUE IOHEXOL 350 MG/ML SOLN COMPARISON:  CT 08/05/2015 FINDINGS: Cardiovascular: There is markedly suboptimal opacification of the pulmonary arteries. No large central filling defects are seen. More distal evaluation is precluded by poor contrast bolus timing. Borderline dilation of the central pulmonary arteries is similar to prior. The aortic root is suboptimally assessed given cardiac pulsation artifact. Atherosclerotic plaque within the normal caliber aorta. No acute luminal abnormality, periaortic stranding or hemorrhage is seen. Normal 3 vessel branching of the aortic arch. Minimal plaque in the proximal great vessels Normal heart size. No pericardial effusion. Coronary artery calcifications are present. Mediastinum/Nodes: Calcified right hilar lymph nodes are present. No pathologically enlarged mediastinal, hilar or axillary adenopathy. No mediastinal fluid or gas. Partially calcified 1.1 cm nodule extending from the left inferior thyroid lobe, does  not warrant further evaluation in a patient of this age. This follows consensus guidelines: Managing Incidental Thyroid Nodules Detected on Imaging: White Paper of the ACR Incidental Thyroid Findings Committee. J Am Coll Radiol 2015; 12:143-150. and [facility] 3-tiered system for managing ITNs: J Am Coll Radiol. 2015; Feb;12(2): 143-50. No acute abnormality of the trachea or esophagus. Lungs/Pleura: There is a small right pleural effusion. Some opacity in the right lung base likely reflects atelectasis though some underlying consolidation  is not excluded. No other focal process is seen. No suspicious nodules or masses. No pneumothorax or left pleural effusion. Scattered punctate calcified granulomata are present within the right lung. Upper Abdomen: No acute abnormalities present in the visualized portions of the upper abdomen. Small exophytic fluid attenuation cyst arising from the upper pole left kidney measuring 13 mm in size (4/94). Musculoskeletal: Multilevel degenerative changes are present in the imaged portions of the spine. Multilevel flowing anterior osteophytosis, compatible with features of diffuse idiopathic skeletal hyperostosis (DISH). No acute osseous abnormality or suspicious osseous lesion. No suspicious chest wall lesions. Review of the MIP images confirms the above findings. IMPRESSION: 1. Markedly suboptimal opacification of the pulmonary arteries. No large central filling defects are seen. More distal evaluation is precluded by poor contrast bolus timing. 2. Small right pleural effusion with right basilar atelectatic change though some underlying consolidation may be present in the appropriate clinical setting. 3. Evidence of prior granulomatous disease with multiple right calcified hilar nodes and few punctate calcified granulomata at lung parenchyma. 4. Coronary artery calcifications. 5. Aortic Atherosclerosis (ICD10-I70.0). Electronically Signed   By: Lovena Le M.D.   On: 02/28/2019 19:55    Procedures .Sedation  Date/Time: 02/28/2019 10:09 PM Performed by: Drenda Freeze, MD Authorized by: Drenda Freeze, MD   Consent:    Consent obtained:  Written (electronic informed consent)   Risks discussed:  Allergic reaction, dysrhythmia, inadequate sedation, nausea, vomiting, respiratory compromise necessitating ventilatory assistance and intubation, prolonged sedation necessitating reversal and prolonged hypoxia resulting in organ damage Universal protocol:    Procedure explained and questions answered to  patient or proxy's satisfaction: yes     Relevant documents present and verified: yes     Test results available and properly labeled: yes     Imaging studies available: yes     Required blood products, implants, devices, and special equipment available: yes     Immediately prior to procedure a time out was called: yes     Patient identity confirmation method:  Arm band Pre-sedation assessment:    Time since last food or drink:  12 hours   ASA classification: class 2 - patient with mild systemic disease     Mallampati score:  II - soft palate, uvula, fauces visible   Pre-sedation assessments completed and reviewed: airway patency, cardiovascular function and mental status   Immediate pre-procedure details:    Reassessment: Patient reassessed immediately prior to procedure     Reviewed: vital signs, relevant labs/tests and NPO status     Verified: bag valve mask available, emergency equipment available, intubation equipment available, IV patency confirmed, oxygen available, reversal medications available and suction available   Procedure details (see MAR for exact dosages):    Intended level of sedation: moderate (conscious sedation)   Intra-procedure monitoring:  Blood pressure monitoring, continuous pulse oximetry, cardiac monitor, frequent vital sign checks and frequent LOC assessments   Total Provider sedation time (minutes):  30 Post-procedure details:    Attendance: Constant attendance by certified staff until patient  recovered     Recovery: Patient returned to pre-procedure baseline     Post-sedation assessments completed and reviewed: airway patency, cardiovascular function, hydration status, mental status and respiratory function     Patient is stable for discharge or admission: yes     Patient tolerance:  Tolerated well, no immediate complications .Cardioversion  Date/Time: 02/28/2019 10:10 PM Performed by: Drenda Freeze, MD Authorized by: Drenda Freeze, MD   Consent:     Consent obtained:  Verbal and written   Consent given by:  Patient   Risks discussed:  Cutaneous burn, death and induced arrhythmia   Alternatives discussed:  No treatment Pre-procedure details:    Cardioversion basis:  Emergent   Rhythm:  Atrial fibrillation   Electrode placement:  Anterior-posterior Patient sedated: Yes. Refer to sedation procedure documentation for details of sedation.  Attempt one:    Cardioversion mode:  Synchronous   Waveform:  Biphasic   Shock (Joules):  200   Shock outcome:  No change in rhythm Post-procedure details:    Patient status:  Awake   Patient tolerance of procedure:  Tolerated well, no immediate complications   (including critical care time)  CRITICAL CARE Performed by: Wandra Arthurs   Total critical care time: 30 minutes  Critical care time was exclusive of separately billable procedures and treating other patients.  Critical care was necessary to treat or prevent imminent or life-threatening deterioration.  Critical care was time spent personally by me on the following activities: development of treatment plan with patient and/or surrogate as well as nursing, discussions with consultants, evaluation of patient's response to treatment, examination of patient, obtaining history from patient or surrogate, ordering and performing treatments and interventions, ordering and review of laboratory studies, ordering and review of radiographic studies, pulse oximetry and re-evaluation of patient's condition.   Medications Ordered in ED Medications  diltiazem (CARDIZEM) 125 mg in dextrose 5% 125 mL (1 mg/mL) infusion (5 mg/hr Intravenous New Bag/Given 02/28/19 2203)  levofloxacin (LEVAQUIN) IVPB 500 mg ( Intravenous Stopped 02/28/19 2104)  iohexol (OMNIPAQUE) 350 MG/ML injection 80 mL (80 mLs Intravenous Contrast Given 02/28/19 1918)  metoprolol tartrate (LOPRESSOR) injection 10 mg (10 mg Intravenous Given 02/28/19 2038)  etomidate (AMIDATE) injection 10 mg  (12 mg Intravenous Given 02/28/19 2148)  diltiazem (CARDIZEM) injection 20 mg (20 mg Intravenous Given 02/28/19 2204)    ED Course  I have reviewed the triage vital signs and the nursing notes.  Pertinent labs & imaging results that were available during my care of the patient were reviewed by me and considered in my medical decision making (see chart for details).    MDM Rules/Calculators/A&P                      HALEEMAH COUTURIER is a 64 y.o. female who presenting with burning sensation in her right chest.  Patient has history of CHF so consider CHF exacerbation versus ACS.  Will get labs and BNP and chest x-ray and troponins.  7 pm CXR showed possible pneumonia. However, no fever and WBC is normal. Will get CTA to further assess   8 pm CTA showed no PE but there is granuloma and infiltrate. Ordered levaquin for pneumonia   9 pm Patient went into rapid afib. She has hx of afib and didn't take her metoprolol. Will order metoprolol IV.   9:30 PM Patient is still in rapid A. fib. At this point she is compliant with her Xarelto so I discussed  risks and benefits of cardioversion versus Cardizem.  We will proceed with cardioversion  10 pm  Shocked with 200 J x 2 with synchronized cardioversion.  However, we were unsuccessful.  Patient went back into rapid A. fib rate of 150.  I talked to Dr. Sonia Side from cardiology.  She agreed with Cardizem bolus and drip and patient will need to be admitted to [facility] stepdown for rapid A. fib.   10:30 PM Dr. Alcario Drought accepted patient to [facility] stepdown because there are no beds at [facility].    Final Clinical Impression(s) / ED Diagnoses Final diagnoses:  Atrial fibrillation with RVR ([place])  Community acquired pneumonia of right lower lobe of lung    Rx / DC Orders ED Discharge Orders    None       Drenda Freeze, MD 02/28/19 2230

## 2019-03-01 ENCOUNTER — Observation Stay (HOSPITAL_COMMUNITY): Payer: BC Managed Care – PPO

## 2019-03-01 ENCOUNTER — Observation Stay (HOSPITAL_BASED_OUTPATIENT_CLINIC_OR_DEPARTMENT_OTHER): Payer: BC Managed Care – PPO

## 2019-03-01 DIAGNOSIS — N179 Acute kidney failure, unspecified: Secondary | ICD-10-CM | POA: Diagnosis not present

## 2019-03-01 DIAGNOSIS — I10 Essential (primary) hypertension: Secondary | ICD-10-CM

## 2019-03-01 DIAGNOSIS — N1831 Chronic kidney disease, stage 3a: Secondary | ICD-10-CM | POA: Diagnosis not present

## 2019-03-01 DIAGNOSIS — I480 Paroxysmal atrial fibrillation: Secondary | ICD-10-CM | POA: Diagnosis present

## 2019-03-01 DIAGNOSIS — N1832 Chronic kidney disease, stage 3b: Secondary | ICD-10-CM

## 2019-03-01 DIAGNOSIS — I4891 Unspecified atrial fibrillation: Secondary | ICD-10-CM

## 2019-03-01 DIAGNOSIS — Z833 Family history of diabetes mellitus: Secondary | ICD-10-CM | POA: Diagnosis not present

## 2019-03-01 DIAGNOSIS — I361 Nonrheumatic tricuspid (valve) insufficiency: Secondary | ICD-10-CM

## 2019-03-01 DIAGNOSIS — K760 Fatty (change of) liver, not elsewhere classified: Secondary | ICD-10-CM | POA: Diagnosis present

## 2019-03-01 DIAGNOSIS — F3132 Bipolar disorder, current episode depressed, moderate: Secondary | ICD-10-CM

## 2019-03-01 DIAGNOSIS — I371 Nonrheumatic pulmonary valve insufficiency: Secondary | ICD-10-CM

## 2019-03-01 DIAGNOSIS — I252 Old myocardial infarction: Secondary | ICD-10-CM | POA: Diagnosis not present

## 2019-03-01 DIAGNOSIS — I2510 Atherosclerotic heart disease of native coronary artery without angina pectoris: Secondary | ICD-10-CM | POA: Diagnosis present

## 2019-03-01 DIAGNOSIS — F319 Bipolar disorder, unspecified: Secondary | ICD-10-CM | POA: Diagnosis present

## 2019-03-01 DIAGNOSIS — G250 Essential tremor: Secondary | ICD-10-CM | POA: Diagnosis present

## 2019-03-01 DIAGNOSIS — E785 Hyperlipidemia, unspecified: Secondary | ICD-10-CM | POA: Diagnosis present

## 2019-03-01 DIAGNOSIS — Z20822 Contact with and (suspected) exposure to covid-19: Secondary | ICD-10-CM | POA: Diagnosis present

## 2019-03-01 DIAGNOSIS — Z86718 Personal history of other venous thrombosis and embolism: Secondary | ICD-10-CM | POA: Diagnosis not present

## 2019-03-01 DIAGNOSIS — Z8249 Family history of ischemic heart disease and other diseases of the circulatory system: Secondary | ICD-10-CM | POA: Diagnosis not present

## 2019-03-01 DIAGNOSIS — I5032 Chronic diastolic (congestive) heart failure: Secondary | ICD-10-CM

## 2019-03-01 DIAGNOSIS — Z7901 Long term (current) use of anticoagulants: Secondary | ICD-10-CM | POA: Diagnosis not present

## 2019-03-01 DIAGNOSIS — I130 Hypertensive heart and chronic kidney disease with heart failure and stage 1 through stage 4 chronic kidney disease, or unspecified chronic kidney disease: Secondary | ICD-10-CM | POA: Diagnosis present

## 2019-03-01 DIAGNOSIS — Z79899 Other long term (current) drug therapy: Secondary | ICD-10-CM | POA: Diagnosis not present

## 2019-03-01 DIAGNOSIS — J189 Pneumonia, unspecified organism: Secondary | ICD-10-CM | POA: Diagnosis present

## 2019-03-01 DIAGNOSIS — Z85828 Personal history of other malignant neoplasm of skin: Secondary | ICD-10-CM | POA: Diagnosis not present

## 2019-03-01 DIAGNOSIS — Z7984 Long term (current) use of oral hypoglycemic drugs: Secondary | ICD-10-CM | POA: Diagnosis not present

## 2019-03-01 DIAGNOSIS — M109 Gout, unspecified: Secondary | ICD-10-CM | POA: Diagnosis present

## 2019-03-01 DIAGNOSIS — E1122 Type 2 diabetes mellitus with diabetic chronic kidney disease: Secondary | ICD-10-CM | POA: Diagnosis present

## 2019-03-01 DIAGNOSIS — J45909 Unspecified asthma, uncomplicated: Secondary | ICD-10-CM | POA: Diagnosis present

## 2019-03-01 LAB — ECHOCARDIOGRAM COMPLETE
Height: 72 in
Weight: 5089.98 oz

## 2019-03-01 LAB — CBC WITH DIFFERENTIAL/PLATELET
Abs Immature Granulocytes: 0.01 10*3/uL (ref 0.00–0.07)
Basophils Absolute: 0 10*3/uL (ref 0.0–0.1)
Basophils Relative: 0 %
Eosinophils Absolute: 0 10*3/uL (ref 0.0–0.5)
Eosinophils Relative: 1 %
HCT: 46 % (ref 36.0–46.0)
Hemoglobin: 15.2 g/dL — ABNORMAL HIGH (ref 12.0–15.0)
Immature Granulocytes: 0 %
Lymphocytes Relative: 20 %
Lymphs Abs: 1.4 10*3/uL (ref 0.7–4.0)
MCH: 29.8 pg (ref 26.0–34.0)
MCHC: 33 g/dL (ref 30.0–36.0)
MCV: 90.2 fL (ref 80.0–100.0)
Monocytes Absolute: 0.7 10*3/uL (ref 0.1–1.0)
Monocytes Relative: 9 %
Neutro Abs: 5 10*3/uL (ref 1.7–7.7)
Neutrophils Relative %: 70 %
Platelets: 112 10*3/uL — ABNORMAL LOW (ref 150–400)
RBC: 5.1 MIL/uL (ref 3.87–5.11)
RDW: 14 % (ref 11.5–15.5)
WBC: 7.2 10*3/uL (ref 4.0–10.5)
nRBC: 0 % (ref 0.0–0.2)

## 2019-03-01 LAB — BRAIN NATRIURETIC PEPTIDE
B Natriuretic Peptide: 102.3 pg/mL — ABNORMAL HIGH (ref 0.0–100.0)
B Natriuretic Peptide: 282.5 pg/mL — ABNORMAL HIGH (ref 0.0–100.0)

## 2019-03-01 LAB — LACTIC ACID, PLASMA: Lactic Acid, Venous: 1.7 mmol/L (ref 0.5–1.9)

## 2019-03-01 LAB — GLUCOSE, CAPILLARY
Glucose-Capillary: 129 mg/dL — ABNORMAL HIGH (ref 70–99)
Glucose-Capillary: 144 mg/dL — ABNORMAL HIGH (ref 70–99)
Glucose-Capillary: 186 mg/dL — ABNORMAL HIGH (ref 70–99)
Glucose-Capillary: 156 mg/dL — ABNORMAL HIGH (ref 70–99)
Glucose-Capillary: 153 mg/dL — ABNORMAL HIGH (ref 70–99)

## 2019-03-01 LAB — BASIC METABOLIC PANEL
Anion gap: 12 (ref 5–15)
BUN: 30 mg/dL — ABNORMAL HIGH (ref 8–23)
CO2: 27 mmol/L (ref 22–32)
Calcium: 8.9 mg/dL (ref 8.9–10.3)
Chloride: 100 mmol/L (ref 98–111)
Creatinine, Ser: 1.4 mg/dL — ABNORMAL HIGH (ref 0.44–1.00)
GFR calc Af Amer: 46 mL/min — ABNORMAL LOW (ref >60)
GFR calc non Af Amer: 40 mL/min — ABNORMAL LOW (ref >60)
Glucose, Bld: 130 mg/dL — ABNORMAL HIGH (ref 70–99)
Potassium: 3.8 mmol/L (ref 3.5–5.1)
Sodium: 139 mmol/L (ref 135–145)

## 2019-03-01 LAB — INFLUENZA PANEL BY PCR (TYPE A & B)
Influenza A By PCR: NEGATIVE
Influenza B By PCR: NEGATIVE

## 2019-03-01 LAB — TSH: TSH: 8.129 u[IU]/mL — ABNORMAL HIGH (ref 0.350–4.500)

## 2019-03-01 LAB — T4, FREE: Free T4: 0.92 ng/dL (ref 0.61–1.12)

## 2019-03-01 LAB — HEMOGLOBIN A1C
Hgb A1c MFr Bld: 6.8 % — ABNORMAL HIGH (ref 4.8–5.6)
Mean Plasma Glucose: 148.46 mg/dL

## 2019-03-01 LAB — STREP PNEUMONIAE URINARY ANTIGEN: Strep Pneumo Urinary Antigen: NEGATIVE

## 2019-03-01 LAB — MAGNESIUM: Magnesium: 2.1 mg/dL (ref 1.7–2.4)

## 2019-03-01 LAB — PROCALCITONIN: Procalcitonin: <0.1 ng/mL

## 2019-03-01 LAB — MRSA PCR SCREENING: MRSA by PCR: NEGATIVE

## 2019-03-01 MED ORDER — DIVALPROEX SODIUM ER 500 MG PO TB24
1500.0000 mg | ORAL_TABLET | Freq: Every day | ORAL | Status: DC
  Administered 2019-03-01 – 2019-03-03 (×4): 1500 mg via ORAL
  Filled 2019-03-01: qty 3
  Filled 2019-03-01 (×3): qty 6

## 2019-03-01 MED ORDER — DIGOXIN 0.25 MG/ML IJ SOLN
0.2500 mg | Freq: Once | INTRAMUSCULAR | Status: AC
  Administered 2019-03-01: 0.25 mg via INTRAVENOUS
  Filled 2019-03-01: qty 2

## 2019-03-01 MED ORDER — LURASIDONE HCL 60 MG PO TABS: 60.0000 mg | ORAL_TABLET | Freq: Every day | ORAL | Status: DC

## 2019-03-01 MED ORDER — SODIUM CHLORIDE 0.9% FLUSH
3.0000 mL | Freq: Two times a day (BID) | INTRAVENOUS | Status: DC
  Administered 2019-03-01 – 2019-03-04 (×7): 3 mL via INTRAVENOUS

## 2019-03-01 MED ORDER — BENZTROPINE MESYLATE 0.5 MG PO TABS
1.0000 mg | ORAL_TABLET | Freq: Two times a day (BID) | ORAL | Status: DC
  Administered 2019-03-01 – 2019-03-04 (×7): 1 mg via ORAL
  Filled 2019-03-01 (×8): qty 2

## 2019-03-01 MED ORDER — LIDOCAINE VISCOUS HCL 2 % MT SOLN
15.0000 mL | Freq: Once | OROMUCOSAL | Status: AC
  Administered 2019-03-01: 15 mL via ORAL
  Filled 2019-03-01: qty 15

## 2019-03-01 MED ORDER — PANTOPRAZOLE SODIUM 40 MG PO TBEC
40.0000 mg | DELAYED_RELEASE_TABLET | Freq: Every day | ORAL | Status: DC
  Administered 2019-03-01 – 2019-03-04 (×4): 40 mg via ORAL
  Filled 2019-03-01 (×4): qty 1

## 2019-03-01 MED ORDER — INSULIN ASPART 100 UNIT/ML ~~LOC~~ SOLN
0.0000 [IU] | Freq: Three times a day (TID) | SUBCUTANEOUS | Status: DC
  Administered 2019-03-01 (×2): 3 [IU] via SUBCUTANEOUS
  Administered 2019-03-01: 2 [IU] via SUBCUTANEOUS
  Administered 2019-03-02: 12:00:00 5 [IU] via SUBCUTANEOUS
  Administered 2019-03-03: 13:00:00 3 [IU] via SUBCUTANEOUS
  Administered 2019-03-03: 2 [IU] via SUBCUTANEOUS
  Administered 2019-03-04: 3 [IU] via SUBCUTANEOUS

## 2019-03-01 MED ORDER — DILTIAZEM HCL 60 MG PO TABS
60.0000 mg | ORAL_TABLET | Freq: Four times a day (QID) | ORAL | Status: DC
  Administered 2019-03-01: 16:00:00 60 mg via ORAL
  Filled 2019-03-01 (×2): qty 1

## 2019-03-01 MED ORDER — CHLORHEXIDINE GLUCONATE CLOTH 2 % EX PADS
6.0000 | MEDICATED_PAD | Freq: Every day | CUTANEOUS | Status: DC
  Administered 2019-03-01 – 2019-03-03 (×2): 6 via TOPICAL

## 2019-03-01 MED ORDER — FUROSEMIDE 40 MG PO TABS: 40.0000 mg | ORAL_TABLET | Freq: Every morning | ORAL | Status: DC

## 2019-03-01 MED ORDER — LISINOPRIL 10 MG PO TABS
20.0000 mg | ORAL_TABLET | Freq: Every day | ORAL | Status: DC
  Administered 2019-03-01: 10:00:00 20 mg via ORAL
  Filled 2019-03-01: qty 2

## 2019-03-01 MED ORDER — FUROSEMIDE 40 MG PO TABS
40.0000 mg | ORAL_TABLET | Freq: Two times a day (BID) | ORAL | Status: DC
  Administered 2019-03-01: 08:00:00 40 mg via ORAL
  Filled 2019-03-01: qty 1

## 2019-03-01 MED ORDER — FUROSEMIDE 40 MG PO TABS
80.0000 mg | ORAL_TABLET | Freq: Every evening | ORAL | Status: DC
  Administered 2019-03-01: 80 mg via ORAL
  Filled 2019-03-01: qty 2

## 2019-03-01 MED ORDER — METOPROLOL TARTRATE 25 MG PO TABS
25.0000 mg | ORAL_TABLET | Freq: Two times a day (BID) | ORAL | Status: DC
  Administered 2019-03-01 – 2019-03-04 (×6): 25 mg via ORAL
  Filled 2019-03-01 (×7): qty 1

## 2019-03-01 MED ORDER — IBUPROFEN 200 MG PO TABS
600.0000 mg | ORAL_TABLET | Freq: Three times a day (TID) | ORAL | Status: DC
  Administered 2019-03-01 – 2019-03-03 (×7): 600 mg via ORAL
  Filled 2019-03-01 (×8): qty 3

## 2019-03-01 MED ORDER — ALLOPURINOL 100 MG PO TABS
200.0000 mg | ORAL_TABLET | Freq: Every day | ORAL | Status: DC
  Administered 2019-03-01 – 2019-03-04 (×4): 200 mg via ORAL
  Filled 2019-03-01 (×4): qty 2

## 2019-03-01 MED ORDER — SODIUM CHLORIDE 0.9 % IV SOLN: 250.0000 mL | INTRAVENOUS | Status: DC | PRN

## 2019-03-01 MED ORDER — SODIUM CHLORIDE 0.9% FLUSH: 3.0000 mL | INTRAVENOUS | Status: DC | PRN

## 2019-03-01 MED ORDER — OLANZAPINE 5 MG PO TABS
15.0000 mg | ORAL_TABLET | Freq: Every day | ORAL | Status: DC
  Administered 2019-03-01 – 2019-03-03 (×3): 15 mg via ORAL
  Filled 2019-03-01 (×2): qty 1
  Filled 2019-03-01: qty 3
  Filled 2019-03-01: qty 1

## 2019-03-01 MED ORDER — ALUM & MAG HYDROXIDE-SIMETH 200-200-20 MG/5ML PO SUSP
30.0000 mL | Freq: Once | ORAL | Status: AC
  Administered 2019-03-01: 30 mL via ORAL
  Filled 2019-03-01: qty 30

## 2019-03-01 MED ORDER — ORAL CARE MOUTH RINSE
15.0000 mL | Freq: Two times a day (BID) | OROMUCOSAL | Status: DC
  Administered 2019-03-01 – 2019-03-04 (×8): 15 mL via OROMUCOSAL

## 2019-03-01 MED ORDER — LEVOFLOXACIN IN D5W 750 MG/150ML IV SOLN
750.0000 mg | INTRAVENOUS | Status: DC
  Administered 2019-03-01 – 2019-03-02 (×2): 750 mg via INTRAVENOUS
  Filled 2019-03-01 (×2): qty 150

## 2019-03-01 MED ORDER — RIVAROXABAN 20 MG PO TABS
20.0000 mg | ORAL_TABLET | Freq: Every day | ORAL | Status: DC
  Administered 2019-03-01 – 2019-03-03 (×3): 20 mg via ORAL
  Filled 2019-03-01 (×3): qty 1

## 2019-03-01 MED ORDER — LURASIDONE HCL 60 MG PO TABS
30.0000 mg | ORAL_TABLET | Freq: Every day | ORAL | Status: DC
  Administered 2019-03-01 – 2019-03-02 (×2): 30 mg via ORAL
  Filled 2019-03-01 (×2): qty 1

## 2019-03-01 MED ORDER — ROSUVASTATIN CALCIUM 20 MG PO TABS
20.0000 mg | ORAL_TABLET | Freq: Every day | ORAL | Status: DC
  Administered 2019-03-01 – 2019-03-04 (×4): 20 mg via ORAL
  Filled 2019-03-01 (×4): qty 1

## 2019-03-01 NOTE — Progress Notes (Signed)
Patient's  heart  rate 132 at rest.  Notified  Dr. Grandville Silos  Notified new orders received.

## 2019-03-01 NOTE — H&P (Signed)
History and Physical    Kathleen [name] [id number] DOB: 1955-04-05 DOA: 02/28/2019  PCP: Darreld Mclean, MD  Patient coming from: home  Chief Complaint:  sob  HPI: Kathleen [name] is a 64 y.o. female with medical history significant of afib, bipolar, ckd, diastolic chf, cad, on xaralto, prior vte comes in with sob.  No cp.  No n/v/d.  Feeling palpitations.  No fever or chills.  Found to be in afib with rvr.  Cardioversion attempted in ED and unsuccessful, pt on xaralto.  On dilt drip and referred for admission for rate control. Started on doxy this past week for sinusitis her covid was neg.  Review of Systems: As per HPI otherwise 10 point review of systems negative.   Past Medical History:  Diagnosis Date  . Anemia   . Anxiety   . Asthma   . Atrial fibrillation ([place])   . Bipolar disorder ([place])   . Cancer ([place])    basal cell carcinoma on right hand, papilloma left breast  . CHF (congestive heart failure) ([place])    pt seen at heart/vascular spec clinic 08/14/2014   . Coarse tremors    hands  . Coronary artery disease   . Depression   . DVT (deep vein thrombosis) in pregnancy    pt. reports that it was post knee surgery, not during pregnancy  . Dyslipidemia   . Fall   . Fatty liver   . Headache    migraines - stopped after menopause  . Heart attack ([place]) 02/2007   non-q-wave with second septal perforator  . Hypertension   . Hypothyroidism    history of took medication, has resolved  . Knee pain, left   . Lower extremity deep venous thrombosis (HCC)    20 years ago   . Measles    hx of in childhood   . Morbid obesity with BMI of 40.0-44.9, adult ([place])   . Pain at surgical incision    Left breast from procedure 09/11/16  . Pneumonia    hx of   . PONV (postoperative nausea and vomiting)    also slow to wake up  . Psychiatric hospitalization 05/2008  . Shingles    20 years ago   . Shortness of breath dyspnea    exercise   . Type 2 diabetes mellitus ([place]) 12/31/2015  .  Urinary incontinence     Past Surgical History:  Procedure Laterality Date  . BASAL CELL CARCINOMA EXCISION    . BREAST EXCISIONAL BIOPSY Left   . BREAST LUMPECTOMY WITH RADIOACTIVE SEED LOCALIZATION Left 09/11/2016   Procedure: LEFT BREAST LUMPECTOMY WITH RADIOACTIVE SEED LOCALIZATION;  Surgeon: Excell Seltzer, MD;  Location: [facility];  Service: General;  Laterality: Left;  . BREAST MASS EXCISION     age 44, benign tumor  . CARDIAC CATHETERIZATION    . CARDIAC CATHETERIZATION N/A 12/30/2015   Procedure: Left Heart Cath and Coronary Angiography;  Surgeon: Belva Crome, MD;  Location: [facility];  Service: Cardiovascular;  Laterality: N/A;  . COLONOSCOPY    . DILATATION & CURETTAGE/HYSTEROSCOPY WITH MYOSURE N/A 09/22/2016   Procedure: DILATATION & CURETTAGE/HYSTEROSCOPY WITH MYOSURE;  Surgeon: Molli Posey, MD;  Location: [facility];  Service: Gynecology;  Laterality: N/A;  . DILATION AND CURETTAGE OF UTERUS    . KNEE SURGERY     right, removed cartilage  . PARATHYROIDECTOMY N/A 08/25/2014   Procedure: PARATHYROIDECTOMY;  Surgeon: Armandina Gemma, MD;  Location: [facility];  Service: General;  Laterality:  N/A;  . SHOULDER ARTHROSCOPY WITH ROTATOR CUFF REPAIR AND SUBACROMIAL DECOMPRESSION Right 02/27/2017   Procedure: Right shoulder arthroscopic rotator cuff repair with biceps tenodesis and subacromial decompression;  Surgeon: Nicholes Stairs, MD;  Location: [facility];  Service: Orthopedics;  Laterality: Right;  150 mins  . TONSILLECTOMY    . TUBAL LIGATION       reports that she has never smoked. She has never used smokeless tobacco. She reports current alcohol use. She reports that she does not use drugs.  Allergies  Allergen Reactions  . Eggs Or Egg-Derived Products Hives and Other (See Comments)    Can eat foods with cooked eggs; CANNOT handle when part of flu vaccine, etc.   . Tetanus Toxoids     Vigorous local reaction to tdap with local pain and itching   . Lamictal  [Lamotrigine] Hives and Rash    Family History  Problem Relation Age of Onset  . Breast cancer Mother 10  . Alcohol abuse Mother   . Alzheimer's disease Mother   . Alcohol abuse Father   . Alzheimer's disease Father   . Parkinson's disease Father   . Alcohol abuse Maternal Grandfather   . Drug abuse Maternal Grandfather   . Alcohol abuse Maternal Grandmother   . Alcohol abuse Paternal Grandfather   . Alcohol abuse Paternal Grandmother   . Schizophrenia Cousin   . Diabetes Brother   . Hypertension Brother     Prior to Admission medications   Medication Sig Start Date End Date Taking? Authorizing Provider  albuterol (PROVENTIL HFA;VENTOLIN HFA) 108 (90 BASE) MCG/ACT inhaler Inhale 2 puffs into the lungs every 6 (six) hours as needed for wheezing. 11/12/12   Ann Held, DO  allopurinol (ZYLOPRIM) 100 MG tablet TAKE TWO TABLETS BY MOUTH DAILY 02/19/19   [name], [name], MD  benztropine (COGENTIN) 1 MG tablet Take 1 mg by mouth 2 (two) times daily. 01/14/17   [provider]  divalproex (DEPAKOTE ER) 500 MG 24 hr tablet Take 1,500 mg by mouth at bedtime.     [provider]  doxycycline (VIBRAMYCIN) 100 MG capsule Take 1 capsule (100 mg total) by mouth 2 (two) times daily. 02/24/19   [name], [name], MD  empagliflozin (JARDIANCE) 10 MG TABS tablet Take 10 mg by mouth daily. 01/13/19   [name], [name], MD  furosemide (LASIX) 80 MG tablet TAKE ONE TABLET BY MOUTH DAILY; MAY ALSO TAKE AN ADDITIONAL 1/2 TABLET BY MOUTH AS NEEDED FOR FLUID 12/16/18   Bensimhon, Shaune Pascal, MD  LATUDA 60 MG TABS Take 60 mg by mouth at bedtime.  03/08/16   [provider]  lisinopril (ZESTRIL) 20 MG tablet TAKE ONE TABLET BY MOUTH DAILY 11/18/18   [name], [name], MD  metFORMIN (GLUCOPHAGE) 500 MG tablet Take 1 tablet (500 mg total) by mouth 2 (two) times daily with a meal. 01/13/19   [name], [name], MD  metoprolol tartrate (LOPRESSOR) 25 MG tablet Take 1 tablet  (25 mg total) by mouth 2 (two) times daily. 01/13/19   [name], [name], MD  nitroGLYCERIN (NITROSTAT) 0.4 MG SL tablet Place 1 tablet (0.4 mg total) under the tongue every 5 (five) minutes as needed for chest pain. For chest pain 12/07/11   Barrett, Evelene Croon, PA-C  rosuvastatin (CRESTOR) 20 MG tablet Take 1 tablet (20 mg total) by mouth daily. 01/13/19   [name], [name], MD  UNABLE TO FIND Med Name: One Touch Verio blood glucose meter Lot [id number] x  Exp 01/22/2018    [provider]  XARELTO 20 MG TABS tablet TAKE ONE TABLET BY MOUTH AT BEDTIME 09/16/18   Bensimhon, Shaune Pascal, MD    Physical Exam: Vitals:   02/28/19 2149 02/28/19 2200 02/28/19 2215 02/28/19 2330  BP: 109/86 126/84 124/71 108/66  Pulse: (!) 137 (!) 158 68 (!) 115  Resp: (!) 30 19 20 18   Temp: 98.4 F (36.9 C) 97.6 F (36.4 C)    TempSrc: Oral Oral    SpO2: 95% 99% 99% 98%  Weight:      Height:          Constitutional: NAD, calm, comfortable Vitals:   02/28/19 2149 02/28/19 2200 02/28/19 2215 02/28/19 2330  BP: 109/86 126/84 124/71 108/66  Pulse: (!) 137 (!) 158 68 (!) 115  Resp: (!) 30 19 20 18   Temp: 98.4 F (36.9 C) 97.6 F (36.4 C)    TempSrc: Oral Oral    SpO2: 95% 99% 99% 98%  Weight:      Height:       Eyes: PERRL, lids and conjunctivae normal ENMT: Mucous membranes are moist. Posterior pharynx clear of any exudate or lesions.Normal dentition.  Neck: normal, supple, no masses, no thyromegaly Respiratory: clear to auscultation bilaterally, no wheezing, no crackles. Normal respiratory effort. No accessory muscle use.  Cardiovascular: irRegular rate and rhythm, no murmurs / rubs / gallops. No extremity edema. 2+ pedal pulses. No carotid bruits.  Abdomen: no tenderness, no masses palpated. No hepatosplenomegaly. Bowel sounds positive.  Musculoskeletal: no clubbing / cyanosis. No joint deformity upper and lower extremities. Good ROM, no contractures. Normal muscle tone.  Skin: no rashes,  lesions, ulcers. No induration Neurologic: CN 2-12 grossly intact. Sensation intact, DTR normal. Strength 5/5 in all 4.  Psychiatric: Normal judgment and insight. Alert and oriented x 3. Normal mood.    Labs on Admission: I have personally reviewed following labs and imaging studies  CBC: Recent Labs  Lab 02/28/19 1739  WBC 6.9  NEUTROABS 4.9  HGB 15.1*  HCT 46.4*  MCV 90.3  PLT 123XX123*   Basic Metabolic Panel: Recent Labs  Lab 02/28/19 1739  NA 136  K 4.1  CL 98  CO2 28  GLUCOSE 153*  BUN 35*  CREATININE 1.48*  CALCIUM 8.9   GFR: Estimated Creatinine Clearance: 63.1 mL/min (A) (by C-G formula based on SCr of 1.48 mg/dL (H)). Liver Function Tests: Recent Labs  Lab 02/28/19 1739  AST 21  ALT 23  ALKPHOS 120  BILITOT 0.8  PROT 6.7  ALBUMIN 3.9   No results for input(s): LIPASE, AMYLASE in the last 168 hours. No results for input(s): AMMONIA in the last 168 hours. Coagulation Profile: No results for input(s): INR, PROTIME in the last 168 hours. Cardiac Enzymes: No results for input(s): CKTOTAL, CKMB, CKMBINDEX, TROPONINI in the last 168 hours. BNP (last 3 results) No results for input(s): PROBNP in the last 8760 hours. HbA1C: No results for input(s): HGBA1C in the last 72 hours. CBG: No results for input(s): GLUCAP in the last 168 hours. Lipid Profile: No results for input(s): CHOL, HDL, LDLCALC, TRIG, CHOLHDL, LDLDIRECT in the last 72 hours. Thyroid Function Tests: No results for input(s): TSH, T4TOTAL, FREET4, T3FREE, THYROIDAB in the last 72 hours. Anemia Panel: No results for input(s): VITAMINB12, FOLATE, FERRITIN, TIBC, IRON, RETICCTPCT in the last 72 hours. Urine analysis:    Component Value Date/Time   COLORURINE YELLOW 08/04/2015 1805   APPEARANCEUR CLEAR 08/04/2015 1805   LABSPEC 1.027 08/04/2015  1805   PHURINE 5.5 08/04/2015 1805   GLUCOSEU NEGATIVE 08/04/2015 1805   HGBUR NEGATIVE 08/04/2015 1805   BILIRUBINUR negative 01/07/2018 1513    KETONESUR 15 (A) 08/04/2015 1805   PROTEINUR Negative 01/07/2018 1513   PROTEINUR NEGATIVE 08/04/2015 1805   UROBILINOGEN 0.2 01/07/2018 1513   UROBILINOGEN 0.2 06/09/2008 1956   NITRITE negative 01/07/2018 1513   NITRITE NEGATIVE 08/04/2015 1805   LEUKOCYTESUR Negative 01/07/2018 1513   Sepsis Labs: !!!!!!!!!!!!!!!!!!!!!!!!!!!!!!!!!!!!!!!!!!!! @LABRCNTIP (procalcitonin:4,lacticidven:4) ) Recent Results (from the past 240 hour(s))  Novel Coronavirus, NAA ([facility])     Status: None   Collection Time: 02/24/19  1:38 PM   Specimen: Nasopharyngeal(NP) swabs in vial transport medium   NASOPHARYNGE  TESTING  Result Value Ref Range Status   SARS-CoV-2, NAA Not Detected Not Detected Final    Comment: This nucleic acid amplification test was developed and its performance characteristics determined by [employer]. Nucleic acid amplification tests include RT-PCR and TMA. This test has not been FDA cleared or approved. This test has been authorized by FDA under an Emergency Use Authorization (EUA). This test is only authorized for the duration of time the declaration that circumstances exist justifying the authorization of the emergency use of in vitro diagnostic tests for detection of SARS-CoV-2 virus and/or diagnosis of COVID-19 infection under section 564(b)(1) of the Act, 21 U.S.C. [id number]) (1), unless the authorization is terminated or revoked sooner. When diagnostic testing is negative, the possibility of a false negative result should be considered in the context of a patient's recent exposures and the presence of clinical signs and symptoms consistent with COVID-19. An individual without symptoms of COVID-19 and who is not shedding SARS-CoV-2 virus wo uld expect to have a negative (not detected) result in this assay.   SARS Coronavirus 2 by RT PCR (hospital order, performed in [facility] hospital lab) Nasopharyngeal Nasopharyngeal Swab     Status: None   Collection Time:  02/28/19 10:17 PM   Specimen: Nasopharyngeal Swab  Result Value Ref Range Status   SARS Coronavirus 2 NEGATIVE NEGATIVE Final    Comment: Performed at [facility], [address] [place], [place] [address]     Radiological Exams on Admission: DG Chest 2 View  Result Date: 02/28/2019 CLINICAL DATA:  Shortness of breath EXAM: CHEST - 2 VIEW COMPARISON:  Radiograph 12/11/2016, CT 08/05/2015 FINDINGS: Stable bandlike opacity in the left lung base compatible with scarring. Hazy opacities in the right mid to infrahilar lung are new from comparison. No focally consolidative opacity, convincing features of edema, pneumothorax, or effusion i. Cardiomediastinal contours are stable. No acute osseous or soft tissue abnormality. IMPRESSION: Some hazy opacity in the right mid to infrahilar lung could reflect atelectasis or early infection in the appropriate clinical context. Stable scarring in the left lung base. Electronically Signed   By: Lovena Le M.D.   On: 02/28/2019 17:22   CT Angio Chest PE W and/or Wo Contrast  Result Date: 02/28/2019 CLINICAL DATA:  Shortness of breath, bilateral leg swelling, history of DVT and PE EXAM: CT ANGIOGRAPHY CHEST WITH CONTRAST TECHNIQUE: Multidetector CT imaging of the chest was performed using the standard protocol during bolus administration of intravenous contrast. Multiplanar CT image reconstructions and MIPs were obtained to evaluate the vascular anatomy. CONTRAST:  90mL OMNIPAQUE IOHEXOL 350 MG/ML SOLN COMPARISON:  CT 08/05/2015 FINDINGS: Cardiovascular: There is markedly suboptimal opacification of the pulmonary arteries. No large central filling defects are seen. More distal evaluation is precluded by poor contrast bolus  timing. Borderline dilation of the central pulmonary arteries is similar to prior. The aortic root is suboptimally assessed given cardiac pulsation artifact. Atherosclerotic plaque within the normal caliber aorta. No acute luminal  abnormality, periaortic stranding or hemorrhage is seen. Normal 3 vessel branching of the aortic arch. Minimal plaque in the proximal great vessels Normal heart size. No pericardial effusion. Coronary artery calcifications are present. Mediastinum/Nodes: Calcified right hilar lymph nodes are present. No pathologically enlarged mediastinal, hilar or axillary adenopathy. No mediastinal fluid or gas. Partially calcified 1.1 cm nodule extending from the left inferior thyroid lobe, does not warrant further evaluation in a patient of this age. This follows consensus guidelines: Managing Incidental Thyroid Nodules Detected on Imaging: White Paper of the ACR Incidental Thyroid Findings Committee. J Am Coll Radiol 2015; 12:143-150. and [employer] 3-tiered system for managing ITNs: J Am Coll Radiol. 2015; Feb;12(2): 143-50. No acute abnormality of the trachea or esophagus. Lungs/Pleura: There is a small right pleural effusion. Some opacity in the right lung base likely reflects atelectasis though some underlying consolidation is not excluded. No other focal process is seen. No suspicious nodules or masses. No pneumothorax or left pleural effusion. Scattered punctate calcified granulomata are present within the right lung. Upper Abdomen: No acute abnormalities present in the visualized portions of the upper abdomen. Small exophytic fluid attenuation cyst arising from the upper pole left kidney measuring 13 mm in size (4/94). Musculoskeletal: Multilevel degenerative changes are present in the imaged portions of the spine. Multilevel flowing anterior osteophytosis, compatible with features of diffuse idiopathic skeletal hyperostosis (DISH). No acute osseous abnormality or suspicious osseous lesion. No suspicious chest wall lesions. Review of the MIP images confirms the above findings. IMPRESSION: 1. Markedly suboptimal opacification of the pulmonary arteries. No large central filling defects are seen. More distal evaluation is  precluded by poor contrast bolus timing. 2. Small right pleural effusion with right basilar atelectatic change though some underlying consolidation may be present in the appropriate clinical setting. 3. Evidence of prior granulomatous disease with multiple right calcified hilar nodes and few punctate calcified granulomata at lung parenchyma. 4. Coronary artery calcifications. 5. Aortic Atherosclerosis (ICD10-I70.0). Electronically Signed   By: Lovena Le M.D.   On: 02/28/2019 19:55    EKG: Independently reviewed. afib w rvr no acute changes cxr with possible r infiltrate   Assessment/Plan 64 yo female with uri comes in with afib withr rvr  Principal Problem:   Atrial fibrillation with RVR ([place])- dilt drip.  Missed metoprolol po dose, give dose today.  Treat pna.  obs on stepdown.  Active Problems:   CAP (community acquired pneumonia)- place on levaquin.  Ck procal level.  Not septic.      Bipolar disorder ([place])- clarify and resume home meds    HYPERTENSION, BENIGN ESSENTIAL- resume home meds    Morbid obesity ([place])- noted    Chronic diastolic heart failure (HCC)- cont lasix    Chronic kidney disease (CKD), stage III (moderate)- at baseline cr less than 1.5     DVT prophylaxis: xaralto Code Status: full Family Communication: none Disposition Plan:  24 hours Consults called:  none Admission status:  observation   Corinthian Kemler A MD [facility]  If 7PM-7AM, please contact night-coverage [email or web address] Password [email or web address]  03/01/2019, 1:17 AM

## 2019-03-01 NOTE — Progress Notes (Signed)
Patient converted to sinus rhythm around 2:50 pm. Notified cardiology Murray Hodgkins, NP ) new orders received.

## 2019-03-01 NOTE — Progress Notes (Signed)
PROGRESS NOTE    ZOII FLORER  XYI:[id number] DOB: December 03, 1955 DOA: 02/28/2019 PCP: Darreld Mclean, MD    Brief Narrative:  HPI per Dr. Loletha Grayer is a 64 y.o. female with medical history significant of afib, bipolar, ckd, diastolic chf, cad, on xaralto, prior vte comes in with sob.  No cp.  No n/v/d.  Feeling palpitations.  No fever or chills.  Found to be in afib with rvr.  Cardioversion attempted in ED and unsuccessful, pt on xaralto.  On dilt drip and referred for admission for rate control. Started on doxy this past week for sinusitis her covid was neg.  Assessment & Plan:   Principal Problem:   Atrial fibrillation with RVR (HCC) Active Problems:   Bipolar disorder (HCC)   HYPERTENSION, BENIGN ESSENTIAL   Morbid obesity ([place])   Chronic diastolic heart failure (HCC)   Chronic kidney disease (CKD), stage III (moderate)   CAP (community acquired pneumonia)  1 A. fib with RVR Patient noted to have come in with A. fib with RVR.  Cardioversion was attempted in the ED however unsuccessful.  Patient on Cardizem drip at maximum dose and heart rate noted to be sustaining per RN in the 130s to the 140s.  Patient's oral metoprolol given early on with sustained heart rates and heart rates now improved ranging in the 80s to low 100s.  TSH obtained is 8.129.  Check a free T4.  Continue Xarelto for anticoagulation.  Continue current regimen of oral metoprolol and Cardizem drip.  Consult with cardiology for further evaluation and management.  2.  Chest pain Patient with atypical right-sided chest pain likely secondary to possible community-acquired pneumonia.  Patient describes chest pain as burning as well as sharp in nature with no significant improvement since admission.  Patient with some chest wall tenderness to palpation.  Placed on scheduled ibuprofen.  GI cocktail x1.  Continue empiric antibiotics for probable community-acquired pneumonia.  3.  Probable community-acquired  pneumonia Patient with complaints of shortness of breath, right-sided chest pain, chest x-ray concerning for possible infiltrate.  CT angiogram chest negative for PE and concern for atelectasis versus possible underlying consolidation.  Patient currently afebrile.  Strep pneumococcus antigen negative.  Continue empiric IV Levaquin.  Incentive spirometry.  Supportive care.  4.  Hypertension Continue metoprolol and lisinopril.  Patient on Cardizem drip.  5.  Morbid obesity  6.  Chronic kidney disease stage IIIb Stable.  7.  History of hypothyroidism Patient stated was on medication however was discontinued per PCP as it was felt her numbers had improved.  TSH at 8.129.  Check a free T4.  Outpatient follow-up with PCP.  8.  Bipolar disorder Due to drug interaction with Cardizem will decrease Latuda to half home dose of 30 mg daily.  Continue Depakote, Zyprexa.  9.  Essential tremors Per patient on Cogentin for this which we will continue.  10.  History of gout Stable.  Continue allopurinol.  11.  History of chronic diastolic CHF Seems to be compensated.  Patient's home regimen of diuretics has been resumed per cardiology.  Continue statin, ACE inhibitor, beta-blocker.  Cardiology consulted.  12.  Hyperlipidemia Continue statin.   DVT prophylaxis: Xarelto Code Status: Full Family Communication: Updated patient.  No family at bedside. Disposition Plan:  . Patient came from: Home            . Anticipated d/c place: Home . Barriers to d/c OR conditions which need to be met to effect a  safe d/c: Home once clinically improved, rate controlled, on oral cardiac medications and when cleared by cardiology.   Consultants:   Cardiology pending  Procedures:   CT angiogram chest 02/28/2019  Chest x-ray 02/28/2019, 03/01/2019  Antimicrobials:   IV Levaquin 02/28/2019   Subjective: Patient laying in bed.  Still with some complaints of right-sided chest pain with no significant improvement  since admission and also pleuritic in nature.  Patient still states with some shortness of breath.  Per RN patient noted to have heart rate sustaining in the 130s despite maximum dose of a Cardizem drip.  Objective: Vitals:   03/01/19 0733 03/01/19 0842 03/01/19 0859 03/01/19 0938  BP:  (!) 144/78  123/63  Pulse:  (!) 131    Resp:      Temp:   97.6 F (36.4 C)   TempSrc:   Oral   SpO2:      Weight: (!) 144.3 kg     Height: 6' (1.829 m)       Intake/Output Summary (Last 24 hours) at 03/01/2019 1015 Last data filed at 03/01/2019 0936 Gross per 24 hour  Intake 178.06 ml  Output 550 ml  Net -371.94 ml   Filed Weights   02/28/19 1636 03/01/19 0733  Weight: (!) 147 kg (!) 144.3 kg    Examination:  General exam: Appears calm and comfortable  Respiratory system: Coarse breath sounds on the right with some crackles noted.  No wheezing.  Speaking in full sentences.  Normal respiratory effort.   Cardiovascular system: Irregularly irregular.  No JVD, murmurs rubs or gallops.  Trace bilateral lower extremity edema.  Right chest wall tender to palpation.  No lesions noted on right chest wall Gastrointestinal system: Abdomen is nondistended, soft and nontender. No organomegaly or masses felt. Normal bowel sounds heard. Central nervous system: Alert and oriented. No focal neurological deficits. Extremities: Trace bilateral lower extremity edema.  Symmetric 5 x 5 power. Skin: Chronic venous stasis changes noted on lower extremities.  Psychiatry: Judgement and insight appear normal. Mood & affect appropriate.     Data Reviewed: I have personally reviewed following labs and imaging studies  CBC: Recent Labs  Lab 02/28/19 1739 03/01/19 0146  WBC 6.9 7.2  NEUTROABS 4.9 5.0  HGB 15.1* 15.2*  HCT 46.4* 46.0  MCV 90.3 90.2  PLT 117* 601*   Basic Metabolic Panel: Recent Labs  Lab 02/28/19 1739 03/01/19 0146  NA 136 139  K 4.1 3.8  CL 98 100  CO2 28 27  GLUCOSE 153* 130*  BUN 35*  30*  CREATININE 1.48* 1.40*  CALCIUM 8.9 8.9  MG  --  2.1   GFR: Estimated Creatinine Clearance: 66 mL/min (A) (by C-G formula based on SCr of 1.4 mg/dL (H)). Liver Function Tests: Recent Labs  Lab 02/28/19 1739  AST 21  ALT 23  ALKPHOS 120  BILITOT 0.8  PROT 6.7  ALBUMIN 3.9   No results for input(s): LIPASE, AMYLASE in the last 168 hours. No results for input(s): AMMONIA in the last 168 hours. Coagulation Profile: No results for input(s): INR, PROTIME in the last 168 hours. Cardiac Enzymes: No results for input(s): CKTOTAL, CKMB, CKMBINDEX, TROPONINI in the last 168 hours. BNP (last 3 results) No results for input(s): PROBNP in the last 8760 hours. HbA1C: No results for input(s): HGBA1C in the last 72 hours. CBG: Recent Labs  Lab 03/01/19 0146 03/01/19 0840  GLUCAP 129* 144*   Lipid Profile: No results for input(s): CHOL, HDL, LDLCALC, TRIG, CHOLHDL,  LDLDIRECT in the last 72 hours. Thyroid Function Tests: Recent Labs    03/01/19 0853  TSH 8.129*   Anemia Panel: No results for input(s): VITAMINB12, FOLATE, FERRITIN, TIBC, IRON, RETICCTPCT in the last 72 hours. Sepsis Labs: Recent Labs  Lab 02/28/19 2217 03/01/19 0146 03/01/19 0531  PROCALCITON  --  <0.10  --   LATICACIDVEN 1.4  --  1.7    Recent Results (from the past 240 hour(s))  Novel Coronavirus, NAA ([facility])     Status: None   Collection Time: 02/24/19  1:38 PM   Specimen: Nasopharyngeal(NP) swabs in vial transport medium   NASOPHARYNGE  TESTING  Result Value Ref Range Status   SARS-CoV-2, NAA Not Detected Not Detected Final    Comment: This nucleic acid amplification test was developed and its performance characteristics determined by [employer]. Nucleic acid amplification tests include RT-PCR and TMA. This test has not been FDA cleared or approved. This test has been authorized by FDA under an Emergency Use Authorization (EUA). This test is only authorized for the duration of time  the declaration that circumstances exist justifying the authorization of the emergency use of in vitro diagnostic tests for detection of SARS-CoV-2 virus and/or diagnosis of COVID-19 infection under section 564(b)(1) of the Act, 21 U.S.C. 395VUY-2(B) (1), unless the authorization is terminated or revoked sooner. When diagnostic testing is negative, the possibility of a false negative result should be considered in the context of a patient's recent exposures and the presence of clinical signs and symptoms consistent with COVID-19. An individual without symptoms of COVID-19 and who is not shedding SARS-CoV-2 virus wo uld expect to have a negative (not detected) result in this assay.   Blood culture (routine x 2)     Status: None (Preliminary result)   Collection Time: 02/28/19 10:17 PM   Specimen: BLOOD LEFT ARM  Result Value Ref Range Status   Specimen Description   Final    BLOOD LEFT ARM Performed at [facility], [address] [place], [place] [address]    Special Requests   Final    BOTTLES DRAWN AEROBIC AND ANAEROBIC Blood Culture adequate volume Performed at [facility], [address] [place], [place] [address]    Culture   Final    NO GROWTH < 12 HOURS Performed at [facility], [place] [address] [place], [place] [address]    Report Status PENDING  Incomplete  SARS Coronavirus 2 by RT PCR (hospital order, performed in [facility] hospital lab) Nasopharyngeal Nasopharyngeal Swab     Status: None   Collection Time: 02/28/19 10:17 PM   Specimen: Nasopharyngeal Swab  Result Value Ref Range Status   SARS Coronavirus 2 NEGATIVE NEGATIVE Final    Comment: Performed at [facility], [address] [place], [place] [address]  Blood culture (routine x 2)     Status: None (Preliminary result)   Collection Time: 02/28/19 10:30 PM   Specimen: BLOOD RIGHT ARM  Result Value Ref Range Status   Specimen Description   Final    BLOOD RIGHT  ARM Performed at [facility], [address] [place], [place] [address]    Special Requests   Final    BOTTLES DRAWN AEROBIC AND ANAEROBIC Blood Culture adequate volume Performed at [facility], [address] [place], [place] [address]    Culture   Final    NO GROWTH < 12 HOURS Performed at  [facility], [place] [address] [place], [place] [address]    Report Status PENDING  Incomplete  MRSA PCR Screening     Status: None   Collection Time: 03/01/19  2:00 AM   Specimen: Nasopharyngeal  Result Value Ref Range Status   MRSA by PCR NEGATIVE NEGATIVE Final    Comment:        The GeneXpert MRSA Assay (FDA approved for NASAL specimens only), is one component of a comprehensive MRSA colonization surveillance program. It is not intended to diagnose MRSA infection nor to guide or monitor treatment for MRSA infections. Performed at [facility], [place] [address], [place], [place] [address]          Radiology Studies: DG Chest 2 View  Result Date: 02/28/2019 CLINICAL DATA:  Shortness of breath EXAM: CHEST - 2 VIEW COMPARISON:  Radiograph 12/11/2016, CT 08/05/2015 FINDINGS: Stable bandlike opacity in the left lung base compatible with scarring. Hazy opacities in the right mid to infrahilar lung are new from comparison. No focally consolidative opacity, convincing features of edema, pneumothorax, or effusion i. Cardiomediastinal contours are stable. No acute osseous or soft tissue abnormality. IMPRESSION: Some hazy opacity in the right mid to infrahilar lung could reflect atelectasis or early infection in the appropriate clinical context. Stable scarring in the left lung base. Electronically Signed   By: Lovena Le M.D.   On: 02/28/2019 17:22   CT Angio Chest PE W and/or Wo Contrast  Result Date: 02/28/2019 CLINICAL DATA:  Shortness of breath, bilateral leg swelling, history of DVT and PE EXAM: CT ANGIOGRAPHY CHEST WITH CONTRAST TECHNIQUE:  Multidetector CT imaging of the chest was performed using the standard protocol during bolus administration of intravenous contrast. Multiplanar CT image reconstructions and MIPs were obtained to evaluate the vascular anatomy. CONTRAST:  67m OMNIPAQUE IOHEXOL 350 MG/ML SOLN COMPARISON:  CT 08/05/2015 FINDINGS: Cardiovascular: There is markedly suboptimal opacification of the pulmonary arteries. No large central filling defects are seen. More distal evaluation is precluded by poor contrast bolus timing. Borderline dilation of the central pulmonary arteries is similar to prior. The aortic root is suboptimally assessed given cardiac pulsation artifact. Atherosclerotic plaque within the normal caliber aorta. No acute luminal abnormality, periaortic stranding or hemorrhage is seen. Normal 3 vessel branching of the aortic arch. Minimal plaque in the proximal great vessels Normal heart size. No pericardial effusion. Coronary artery calcifications are present. Mediastinum/Nodes: Calcified right hilar lymph nodes are present. No pathologically enlarged mediastinal, hilar or axillary adenopathy. No mediastinal fluid or gas. Partially calcified 1.1 cm nodule extending from the left inferior thyroid lobe, does not warrant further evaluation in a patient of this age. This follows consensus guidelines: Managing Incidental Thyroid Nodules Detected on Imaging: White Paper of the ACR Incidental Thyroid Findings Committee. J Am Coll Radiol 2015; 12:143-150. and [employer] 3-tiered system for managing ITNs: J Am Coll Radiol. 2015; Feb;12(2): 143-50. No acute abnormality of the trachea or esophagus. Lungs/Pleura: There is a small right pleural effusion. Some opacity in the right lung base likely reflects atelectasis though some underlying consolidation is not excluded. No other focal process is seen. No suspicious nodules or masses. No pneumothorax or left pleural effusion. Scattered punctate calcified granulomata are present within the  right lung. Upper Abdomen: No acute abnormalities present in the visualized portions of the upper abdomen. Small exophytic fluid attenuation cyst arising from the upper pole left kidney measuring 13 mm in size (4/94). Musculoskeletal: Multilevel degenerative changes are present in the imaged  portions of the spine. Multilevel flowing anterior osteophytosis, compatible with features of diffuse idiopathic skeletal hyperostosis (DISH). No acute osseous abnormality or suspicious osseous lesion. No suspicious chest wall lesions. Review of the MIP images confirms the above findings. IMPRESSION: 1. Markedly suboptimal opacification of the pulmonary arteries. No large central filling defects are seen. More distal evaluation is precluded by poor contrast bolus timing. 2. Small right pleural effusion with right basilar atelectatic change though some underlying consolidation may be present in the appropriate clinical setting. 3. Evidence of prior granulomatous disease with multiple right calcified hilar nodes and few punctate calcified granulomata at lung parenchyma. 4. Coronary artery calcifications. 5. Aortic Atherosclerosis (ICD10-I70.0). Electronically Signed   By: Lovena Le M.D.   On: 02/28/2019 19:55   DG CHEST PORT 1 VIEW  Result Date: 03/01/2019 CLINICAL DATA:  Hypoxia. Postop day 1 RIGHT rotator cuff repair and subacromial decompression. EXAM: PORTABLE CHEST 1 VIEW COMPARISON:  02/28/2019 and earlier, including CTA chest 02/28/2019. FINDINGS: Cardiac silhouette normal in size, unchanged. Worsening bibasilar atelectasis since yesterday. No new pulmonary parenchymal abnormalities elsewhere. Normal pulmonary vascularity. Small RIGHT pleural effusion, unchanged. IMPRESSION: 1. Worsening bibasilar atelectasis since yesterday. 2. Stable small RIGHT pleural effusion. 3. No new abnormalities elsewhere. Electronically Signed   By: Evangeline Dakin M.D.   On: 03/01/2019 09:41        Scheduled Meds: . allopurinol   200 mg Oral Daily  . alum & mag hydroxide-simeth  30 mL Oral Once   And  . lidocaine  15 mL Oral Once  . benztropine  1 mg Oral BID  . Chlorhexidine Gluconate Cloth  6 each Topical Daily  . divalproex  1,500 mg Oral QHS  . furosemide  40 mg Oral BID  . ibuprofen  600 mg Oral TID  . insulin aspart  0-15 Units Subcutaneous TID WC  . lisinopril  20 mg Oral Daily  . Lurasidone HCl  30 mg Oral QHS  . mouth rinse  15 mL Mouth Rinse BID  . metoprolol tartrate  25 mg Oral BID  . OLANZapine  15 mg Oral QHS  . rivaroxaban  20 mg Oral q1800  . rosuvastatin  20 mg Oral Daily  . sodium chloride flush  3 mL Intravenous Q12H   Continuous Infusions: . sodium chloride    . diltiazem (CARDIZEM) infusion 15 mg/hr (03/01/19 0457)  . levofloxacin (LEVAQUIN) IV 750 mg (03/01/19 0936)     LOS: 0 days    Time spent: 40 minutes  No charge    Irine Seal, MD [facility]   To contact the attending provider between 7A-7P or the covering provider during after hours 7P-7A, please log into the web site [email or web address] and access using universal [place] password for that web site. If you do not have the password, please call the hospital operator.  03/01/2019, 10:15 AM
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
## 2019-03-01 NOTE — Progress Notes (Signed)
  Echocardiogram 2D Echocardiogram has been performed.  Natallia Stellmach A Aara Jacquot 03/01/2019, 1:47 PM

## 2019-03-01 NOTE — Sedation Documentation (Signed)
Unable to assess pain due to sedation during cardioversion

## 2019-03-02 DIAGNOSIS — N179 Acute kidney failure, unspecified: Secondary | ICD-10-CM

## 2019-03-02 LAB — CBC WITH DIFFERENTIAL/PLATELET
Abs Immature Granulocytes: 0.02 10*3/uL (ref 0.00–0.07)
Basophils Absolute: 0 10*3/uL (ref 0.0–0.1)
Basophils Relative: 0 %
Eosinophils Absolute: 0.1 10*3/uL (ref 0.0–0.5)
Eosinophils Relative: 2 %
HCT: 40.8 % (ref 36.0–46.0)
Hemoglobin: 13.4 g/dL (ref 12.0–15.0)
Immature Granulocytes: 0 %
Lymphocytes Relative: 37 %
Lymphs Abs: 1.8 10*3/uL (ref 0.7–4.0)
MCH: 30.2 pg (ref 26.0–34.0)
MCHC: 32.8 g/dL (ref 30.0–36.0)
MCV: 92.1 fL (ref 80.0–100.0)
Monocytes Absolute: 0.5 10*3/uL (ref 0.1–1.0)
Monocytes Relative: 11 %
Neutro Abs: 2.4 10*3/uL (ref 1.7–7.7)
Neutrophils Relative %: 50 %
Platelets: 105 10*3/uL — ABNORMAL LOW (ref 150–400)
RBC: 4.43 MIL/uL (ref 3.87–5.11)
RDW: 14.3 % (ref 11.5–15.5)
WBC: 4.8 10*3/uL (ref 4.0–10.5)
nRBC: 0 % (ref 0.0–0.2)

## 2019-03-02 LAB — GLUCOSE, CAPILLARY
Glucose-Capillary: 119 mg/dL — ABNORMAL HIGH (ref 70–99)
Glucose-Capillary: 215 mg/dL — ABNORMAL HIGH (ref 70–99)
Glucose-Capillary: 90 mg/dL (ref 70–99)
Glucose-Capillary: 117 mg/dL — ABNORMAL HIGH (ref 70–99)

## 2019-03-02 LAB — BASIC METABOLIC PANEL
Anion gap: 11 (ref 5–15)
BUN: 30 mg/dL — ABNORMAL HIGH (ref 8–23)
CO2: 26 mmol/L (ref 22–32)
Calcium: 8.6 mg/dL — ABNORMAL LOW (ref 8.9–10.3)
Chloride: 99 mmol/L (ref 98–111)
Creatinine, Ser: 1.84 mg/dL — ABNORMAL HIGH (ref 0.44–1.00)
GFR calc Af Amer: 33 mL/min — ABNORMAL LOW (ref >60)
GFR calc non Af Amer: 29 mL/min — ABNORMAL LOW (ref >60)
Glucose, Bld: 126 mg/dL — ABNORMAL HIGH (ref 70–99)
Potassium: 4 mmol/L (ref 3.5–5.1)
Sodium: 136 mmol/L (ref 135–145)

## 2019-03-02 LAB — LEGIONELLA PNEUMOPHILA SEROGP 1 UR AG: L. pneumophila Serogp 1 Ur Ag: NEGATIVE

## 2019-03-02 LAB — MAGNESIUM: Magnesium: 2.3 mg/dL (ref 1.7–2.4)

## 2019-03-02 MED ORDER — LEVALBUTEROL HCL 0.63 MG/3ML IN NEBU
0.6300 mg | INHALATION_SOLUTION | Freq: Three times a day (TID) | RESPIRATORY_TRACT | Status: DC
  Filled 2019-03-02: qty 3

## 2019-03-02 MED ORDER — SODIUM CHLORIDE 0.9 % IV BOLUS
500.0000 mL | Freq: Once | INTRAVENOUS | Status: AC
  Administered 2019-03-02: 01:00:00 500 mL via INTRAVENOUS

## 2019-03-02 MED ORDER — LEVALBUTEROL HCL 0.63 MG/3ML IN NEBU
0.6300 mg | INHALATION_SOLUTION | Freq: Four times a day (QID) | RESPIRATORY_TRACT | Status: DC
  Administered 2019-03-02: 20:00:00 0.63 mg via RESPIRATORY_TRACT
  Filled 2019-03-02: qty 3

## 2019-03-02 MED ORDER — LEVALBUTEROL HCL 0.63 MG/3ML IN NEBU
0.6300 mg | INHALATION_SOLUTION | Freq: Three times a day (TID) | RESPIRATORY_TRACT | Status: DC
  Administered 2019-03-03: 08:00:00 0.63 mg via RESPIRATORY_TRACT
  Filled 2019-03-02: qty 3

## 2019-03-02 MED ORDER — LEVALBUTEROL TARTRATE 45 MCG/ACT IN AERO: 2.0000 | INHALATION_SPRAY | Freq: Three times a day (TID) | RESPIRATORY_TRACT | Status: DC

## 2019-03-02 NOTE — Progress Notes (Addendum)
Progress Note  Patient Name: Kathleen [name] Date of Encounter: 03/02/2019  Primary Cardiologist: No primary care provider on file.   Subjective   Converted to NSR yesterday afternoon.  Denies any chest pain, SOB or palpitations  Inpatient Medications    Scheduled Meds: . allopurinol  200 mg Oral Daily  . benztropine  1 mg Oral BID  . Chlorhexidine Gluconate Cloth  6 each Topical Daily  . diltiazem  60 mg Oral Q6H  . divalproex  1,500 mg Oral QHS  . furosemide  40 mg Oral q morning - 10a  . furosemide  80 mg Oral QPM  . ibuprofen  600 mg Oral TID  . insulin aspart  0-15 Units Subcutaneous TID WC  . lisinopril  20 mg Oral Daily  . Lurasidone HCl  30 mg Oral QHS  . mouth rinse  15 mL Mouth Rinse BID  . metoprolol tartrate  25 mg Oral BID  . OLANZapine  15 mg Oral QHS  . pantoprazole  40 mg Oral Q0600  . rivaroxaban  20 mg Oral q1800  . rosuvastatin  20 mg Oral Daily  . sodium chloride flush  3 mL Intravenous Q12H   Continuous Infusions: . sodium chloride    . levofloxacin (LEVAQUIN) IV Stopped (03/01/19 1106)   PRN Meds: sodium chloride, sodium chloride flush   Vital Signs    Vitals:   03/02/19 0344 03/02/19 0400 03/02/19 0500 03/02/19 0606  BP:  (!) 99/48  (!) 93/54  Pulse:  66  67  Resp:      Temp: (!) 97.4 F (36.3 C)     TempSrc: Oral     SpO2:  92%  98%  Weight:   (!) 146 kg   Height:        Intake/Output Summary (Last 24 hours) at 03/02/2019 0734 Last data filed at 03/02/2019 0300 Gross per 24 hour  Intake 831.11 ml  Output 1325 ml  Net -493.89 ml   Filed Weights   02/28/19 1636 03/01/19 0733 03/02/19 0500  Weight: (!) 147 kg (!) 144.3 kg (!) 146 kg    Telemetry    NSR - Personally Reviewed  ECG    No new EKG to review - Personally Reviewed  Physical Exam   GEN: No acute distress.   Neck: No JVD Cardiac: RRR, no murmurs, rubs, or gallops.  Respiratory: Clear to auscultation bilaterally. GI: Soft, nontender, non-distended  MS: No edema;  No deformity. Neuro:  Nonfocal  Psych: Normal affect   Labs    Chemistry Recent Labs  Lab 02/28/19 1739 03/01/19 0146 03/02/19 0331  NA 136 139 136  K 4.1 3.8 4.0  CL 98 100 99  CO2 28 27 26   GLUCOSE 153* 130* 126*  BUN 35* 30* 30*  CREATININE 1.48* 1.40* 1.84*  CALCIUM 8.9 8.9 8.6*  PROT 6.7  --   --   ALBUMIN 3.9  --   --   AST 21  --   --   ALT 23  --   --   ALKPHOS 120  --   --   BILITOT 0.8  --   --   GFRNONAA 37* 40* 29*  GFRAA 43* 46* 33*  ANIONGAP 10 12 11      Hematology Recent Labs  Lab 02/28/19 1739 03/01/19 0146 03/02/19 0331  WBC 6.9 7.2 4.8  RBC 5.14* 5.10 4.43  HGB 15.1* 15.2* 13.4  HCT 46.4* 46.0 40.8  MCV 90.3 90.2 92.1  MCH 29.4 29.8 30.2  MCHC 32.5 33.0 32.8  RDW 14.1 14.0 14.3  PLT 117* 112* 105*    Cardiac EnzymesNo results for input(s): TROPONINI in the last 168 hours. No results for input(s): TROPIPOC in the last 168 hours.   BNP Recent Labs  Lab 02/28/19 1657 03/01/19 0146  BNP 117.1* 102.3*     DDimer No results for input(s): DDIMER in the last 168 hours.   Radiology    DG Chest 2 View  Result Date: 02/28/2019 CLINICAL DATA:  Shortness of breath EXAM: CHEST - 2 VIEW COMPARISON:  Radiograph 12/11/2016, CT 08/05/2015 FINDINGS: Stable bandlike opacity in the left lung base compatible with scarring. Hazy opacities in the right mid to infrahilar lung are new from comparison. No focally consolidative opacity, convincing features of edema, pneumothorax, or effusion i. Cardiomediastinal contours are stable. No acute osseous or soft tissue abnormality. IMPRESSION: Some hazy opacity in the right mid to infrahilar lung could reflect atelectasis or early infection in the appropriate clinical context. Stable scarring in the left lung base. Electronically Signed   By: Lovena Le M.D.   On: 02/28/2019 17:22   CT Angio Chest PE W and/or Wo Contrast  Result Date: 02/28/2019 CLINICAL DATA:  Shortness of breath, bilateral leg swelling, history  of DVT and PE EXAM: CT ANGIOGRAPHY CHEST WITH CONTRAST TECHNIQUE: Multidetector CT imaging of the chest was performed using the standard protocol during bolus administration of intravenous contrast. Multiplanar CT image reconstructions and MIPs were obtained to evaluate the vascular anatomy. CONTRAST:  22mL OMNIPAQUE IOHEXOL 350 MG/ML SOLN COMPARISON:  CT 08/05/2015 FINDINGS: Cardiovascular: There is markedly suboptimal opacification of the pulmonary arteries. No large central filling defects are seen. More distal evaluation is precluded by poor contrast bolus timing. Borderline dilation of the central pulmonary arteries is similar to prior. The aortic root is suboptimally assessed given cardiac pulsation artifact. Atherosclerotic plaque within the normal caliber aorta. No acute luminal abnormality, periaortic stranding or hemorrhage is seen. Normal 3 vessel branching of the aortic arch. Minimal plaque in the proximal great vessels Normal heart size. No pericardial effusion. Coronary artery calcifications are present. Mediastinum/Nodes: Calcified right hilar lymph nodes are present. No pathologically enlarged mediastinal, hilar or axillary adenopathy. No mediastinal fluid or gas. Partially calcified 1.1 cm nodule extending from the left inferior thyroid lobe, does not warrant further evaluation in a patient of this age. This follows consensus guidelines: Managing Incidental Thyroid Nodules Detected on Imaging: White Paper of the ACR Incidental Thyroid Findings Committee. J Am Coll Radiol 2015; 12:143-150. and [facility] 3-tiered system for managing ITNs: J Am Coll Radiol. 2015; Feb;12(2): 143-50. No acute abnormality of the trachea or esophagus. Lungs/Pleura: There is a small right pleural effusion. Some opacity in the right lung base likely reflects atelectasis though some underlying consolidation is not excluded. No other focal process is seen. No suspicious nodules or masses. No pneumothorax or left pleural effusion.  Scattered punctate calcified granulomata are present within the right lung. Upper Abdomen: No acute abnormalities present in the visualized portions of the upper abdomen. Small exophytic fluid attenuation cyst arising from the upper pole left kidney measuring 13 mm in size (4/94). Musculoskeletal: Multilevel degenerative changes are present in the imaged portions of the spine. Multilevel flowing anterior osteophytosis, compatible with features of diffuse idiopathic skeletal hyperostosis (DISH). No acute osseous abnormality or suspicious osseous lesion. No suspicious chest wall lesions. Review of the MIP images confirms the above findings. IMPRESSION: 1. Markedly suboptimal opacification of the pulmonary arteries. No large central filling defects  are seen. More distal evaluation is precluded by poor contrast bolus timing. 2. Small right pleural effusion with right basilar atelectatic change though some underlying consolidation may be present in the appropriate clinical setting. 3. Evidence of prior granulomatous disease with multiple right calcified hilar nodes and few punctate calcified granulomata at lung parenchyma. 4. Coronary artery calcifications. 5. Aortic Atherosclerosis (ICD10-I70.0). Electronically Signed   By: Lovena Le M.D.   On: 02/28/2019 19:55   DG CHEST PORT 1 VIEW  Result Date: 03/01/2019 CLINICAL DATA:  Hypoxia. Postop day 1 RIGHT rotator cuff repair and subacromial decompression. EXAM: PORTABLE CHEST 1 VIEW COMPARISON:  02/28/2019 and earlier, including CTA chest 02/28/2019. FINDINGS: Cardiac silhouette normal in size, unchanged. Worsening bibasilar atelectasis since yesterday. No new pulmonary parenchymal abnormalities elsewhere. Normal pulmonary vascularity. Small RIGHT pleural effusion, unchanged. IMPRESSION: 1. Worsening bibasilar atelectasis since yesterday. 2. Stable small RIGHT pleural effusion. 3. No new abnormalities elsewhere. Electronically Signed   By: Evangeline Dakin M.D.   On:  03/01/2019 09:41   ECHOCARDIOGRAM COMPLETE  Result Date: 03/01/2019   ECHOCARDIOGRAM REPORT   Patient Name:   Kathleen [name] Date of Exam: 03/01/2019 Medical Rec #:  [id number]     Height:       72.0 in Accession #:    [id number]    Weight:       318.1 lb Date of Birth:  1955/09/20      BSA:          2.60 m Patient Age:    64 years      BP:           100/47 mmHg Patient Gender: F             HR:           37 bpm. Exam Location:  Inpatient Procedure: 2D Echo Indications:    Atrial Fibrillation 427.31 / I48.91  History:        Patient has prior history of Echocardiogram examinations, most                 recent 08/07/2017. Previous Myocardial Infarction, COPD,                 Signs/Symptoms:Shortness of Breath; Risk Factors:Hypertension,                 Diabetes and Morbid obesity. CKD.  Sonographer:    Vikki Ports Turrentine Referring Phys: [place]  1. Left ventricular ejection fraction, by visual estimation, is 60 to 65%. The left ventricle has normal function. There is no left ventricular hypertrophy.  2. The left ventricle has no regional wall motion abnormalities.  3. Left ventricular diastolic function could not be evaluated secondary to atrial fibrillation.  4. Global right ventricle has normal systolic function.The right ventricular size is normal. No increase in right ventricular wall thickness.  5. Left atrial size was normal.  6. Right atrial size was normal.  7. The mitral valve is normal in structure. No evidence of mitral valve regurgitation. No evidence of mitral stenosis.  8. The tricuspid valve is normal in structure. Tricuspid valve regurgitation is mild.  9. The aortic valve is normal in structure. Aortic valve regurgitation is mild. Moderate aortic valve sclerosis/calcification without any evidence of aortic stenosis. 10. The pulmonic valve was normal in structure. Pulmonic valve regurgitation is mild. 11. Normal pulmonary artery systolic pressure. 12. The inferior vena cava is  normal in size with greater than 50% respiratory variability, suggesting  right atrial pressure of 3 mmHg. FINDINGS  Left Ventricle: Left ventricular ejection fraction, by visual estimation, is 60 to 65%. The left ventricle has normal function. The left ventricle has no regional wall motion abnormalities. There is no left ventricular hypertrophy. The left ventricular diastology could not be evaluated due to atrial fibrillation. Left ventricular diastolic function could not be evaluated. Normal left atrial pressure. Right Ventricle: The right ventricular size is normal. No increase in right ventricular wall thickness. Global RV systolic function is has normal systolic function. The tricuspid regurgitant velocity is 2.23 m/s, and with an assumed right atrial pressure  of 3 mmHg, the estimated right ventricular systolic pressure is normal at 22.9 mmHg. Left Atrium: Left atrial size was normal in size. Right Atrium: Right atrial size was normal in size Pericardium: There is no evidence of pericardial effusion. Mitral Valve: The mitral valve is normal in structure. No evidence of mitral valve regurgitation. No evidence of mitral valve stenosis by observation. Tricuspid Valve: The tricuspid valve is normal in structure. Tricuspid valve regurgitation is mild. Aortic Valve: The aortic valve is normal in structure. Aortic valve regurgitation is mild. Mild to moderate aortic valve sclerosis/calcification is present, without any evidence of aortic stenosis. Pulmonic Valve: The pulmonic valve was normal in structure. Pulmonic valve regurgitation is mild. Pulmonic regurgitation is mild. Aorta: The aortic root, ascending aorta and aortic arch are all structurally normal, with no evidence of dilitation or obstruction. Venous: The inferior vena cava is normal in size with greater than 50% respiratory variability, suggesting right atrial pressure of 3 mmHg. IAS/Shunts: No atrial level shunt detected by color flow Doppler. There is no  evidence of a patent foramen ovale. No ventricular septal defect is seen or detected. There is no evidence of an atrial septal defect.  LEFT VENTRICLE PLAX 2D LVIDd:         3.70 cm LVIDs:         2.70 cm LV PW:         0.90 cm LV IVS:        0.90 cm LVOT diam:     2.00 cm LV SV:         31 ml LV SV Index:   11.22 LVOT Area:     3.14 cm  RIGHT VENTRICLE TAPSE (M-mode): 1.6 cm LEFT ATRIUM           Index       RIGHT ATRIUM           Index LA diam:      3.70 cm 1.43 cm/m  RA Area:     15.50 cm LA Vol (A2C): 28.8 ml 11.10 ml/m RA Volume:   39.60 ml  15.26 ml/m LA Vol (A4C): 65.7 ml 25.32 ml/m  AORTIC VALVE LVOT Vmax:   87.83 cm/s LVOT Vmean:  62.800 cm/s LVOT VTI:    0.139 m  AORTA Ao Root diam: 2.90 cm TRICUSPID VALVE TR Peak grad:   19.9 mmHg TR Vmax:        223.00 cm/s  SHUNTS Systemic VTI:  0.14 m Systemic Diam: 2.00 cm  Fransico Him MD Electronically signed by Fransico Him MD Signature Date/Time: 03/01/2019/2:09:00 PM    Final     Cardiac Studies   2D echo 03/01/2019  IMPRESSIONS    1. Left ventricular ejection fraction, by visual estimation, is 60 to  65%. The left ventricle has normal function. There is no left ventricular  hypertrophy.  2. The left ventricle has no regional  wall motion abnormalities.  3. Left ventricular diastolic function could not be evaluated secondary  to atrial fibrillation.  4. Global right ventricle has normal systolic function.The right  ventricular size is normal. No increase in right ventricular wall  thickness.  5. Left atrial size was normal.  6. Right atrial size was normal.  7. The mitral valve is normal in structure. No evidence of mitral valve  regurgitation. No evidence of mitral stenosis.  8. The tricuspid valve is normal in structure. Tricuspid valve  regurgitation is mild.  9. The aortic valve is normal in structure. Aortic valve regurgitation is  mild. Moderate aortic valve sclerosis/calcification without any evidence  of aortic  stenosis.  10. The pulmonic valve was normal in structure. Pulmonic valve  regurgitation is mild.  11. Normal pulmonary artery systolic pressure.  12. The inferior vena cava is normal in size with greater than 50%  respiratory variability, suggesting right atrial pressure of 3 mmHg.   Patient Profile     64 y.o. female with a hx of morbid obesity, chronic diastolic CHF followed in AHF clinic, ASCAD, PAF, DM2, HTN, HLD and prior DVT on Xarelto. Admitted with palpitations and new onset atrial fibrillation in the setting of CAP.  Assessment & Plan    1  Atrial fibrillation with RVR -she has a hx of afib in the past -is on Xarelto chronically for hx of DVT -failed attempted DCCV in ER -converted yesterday to NSR  -TSH was normal at 4.66 in Dec 2020 -IV Cardizem now off after converting to NSR and soft BP -Lopressor 25mg  BID as BP tolerates - BP parameters placed -continue Xarelto -2D echo with normal LVF and normal LA size -afib likely triggered by PNA  2.  CAP -started out as sinusitis last week and was on doxy -now on Levaquin per TRH -does not appear septic  3.  HTN -BP on the soft side today at 93/69mmHg likely related to overdiuresis -hold Lisinopril for now -Cardizem stopped -continue Lopressor 25mg  BID as BP allows  4.  Chronic diastolic CHF -does not appear volume overloaded -she put out 1.3L yesterday and is net neg 315cc -strict I&Os and daily weights -Creatinine bumped to 1.84 today -will hold diuretics today and repeat BMET in am  5.  Atypical CP -pleuritic in nature and on the right side -she has a small pleural effusion on CT -likely due to pleurisy -not c/w ACS and EKG nonischemic.   I have spent a total of 25 minutes with patient reviewing 2D echo, chest xray, hospital notes , telemetry, EKGs, labs and examining patient as well as establishing an assessment and plan that was discussed with the patient.  > 50% of time was spent in direct patient care.          For questions or updates, please contact [place] Please consult [email or web address] for contact info under Cardiology/STEMI.      Signed, Fransico Him, MD  03/02/2019, 7:34 AM

## 2019-03-02 NOTE — Evaluation (Signed)
Physical Therapy Evaluation Patient Details Name: Kathleen [name] MRN: [id number] DOB: 27-Mar-1955 Today's Date: 03/02/2019   History of Present Illness  Pt admitted with c/o SOB 2* CAP and found to be in a-fib with RVR.  Pt with hx of bipolar, DM, MI, CAD, CHF,   Clinical Impression  Pt admitted as above and presenting with functional mobility limitations 2* generalized weakness and ambulatory balance deficits.  Pt hopes to progress to dc home alone with very ltd assist and would benefit from use of RW.    Follow Up Recommendations Home health PT    Equipment Recommendations  Rolling walker with 5" wheels    Recommendations for Other Services       Precautions / Restrictions Precautions Precautions: Fall Precaution Comments: Pt admits to multiple recent falls Restrictions Weight Bearing Restrictions: No      Mobility  Bed Mobility               General bed mobility comments: Pt up with RN to chair and requests back to same  Transfers Overall transfer level: Needs assistance Equipment used: Rolling walker (2 wheeled) Transfers: Sit to/from Stand Sit to Stand: Min assist         General transfer comment: cues for use of UEs to self assist  Ambulation/Gait Ambulation/Gait assistance: Min assist Gait Distance (Feet): 230 Feet Assistive device: Rolling walker (2 wheeled);Straight cane Gait Pattern/deviations: Step-through pattern;Decreased step length - right;Decreased step length - left;Shuffle;Staggering right;Staggering left;Trunk flexed Gait velocity: decr   General Gait Details: Pt with stilted gait, wide BOS and general instability ambulating with SPC - significantly improved stability with RW  Stairs            Wheelchair Mobility    Modified Rankin (Stroke Patients Only)       Balance Overall balance assessment: Needs assistance Sitting-balance support: No upper extremity supported;Feet supported Sitting balance-Leahy Scale: Good      Standing balance support: No upper extremity supported Standing balance-Leahy Scale: Fair                               Pertinent Vitals/Pain Pain Assessment: No/denies pain    Home Living Family/patient expects to be discharged to:: Private residence Living Arrangements: Alone   Type of Home: House Home Access: Stairs to enter   Technical brewer of Steps: 1 Home Layout: One level Home Equipment: Cane - single point      Prior Function Level of Independence: Independent with assistive device(s)         Comments: Pt states she uses cane at all times but has had several recent falls     Hand Dominance        Extremity/Trunk Assessment   Upper Extremity Assessment Upper Extremity Assessment: Defer to OT evaluation    Lower Extremity Assessment Lower Extremity Assessment: Generalized weakness       Communication   Communication: No difficulties  Cognition Arousal/Alertness: Awake/alert Behavior During Therapy: WFL for tasks assessed/performed Overall Cognitive Status: Within Functional Limits for tasks assessed                                        General Comments      Exercises     Assessment/Plan    PT Assessment Patient needs continued PT services  PT Problem List Decreased strength;Decreased activity tolerance;Decreased balance;Decreased  mobility;Decreased knowledge of use of DME;Obesity       PT Treatment Interventions DME instruction;Gait training;Stair training;Functional mobility training;Therapeutic activities;Therapeutic exercise;Patient/family education;Balance training    PT Goals (Current goals can be found in the Care Plan section)  Acute Rehab PT Goals Patient Stated Goal: Regain IND and return to previous living arrangement PT Goal Formulation: With patient Time For Goal Achievement: 03/16/19 Potential to Achieve Goals: Good    Frequency Min 3X/week   Barriers to discharge         Co-evaluation PT/OT/SLP Co-Evaluation/Treatment: Yes Reason for Co-Treatment: For patient/therapist safety PT goals addressed during session: Mobility/safety with mobility OT goals addressed during session: ADL's and self-care       AM-PAC PT "6 Clicks" Mobility  Outcome Measure Help needed turning from your back to your side while in a flat bed without using bedrails?: None Help needed moving from lying on your back to sitting on the side of a flat bed without using bedrails?: None Help needed moving to and from a bed to a chair (including a wheelchair)?: A Little Help needed standing up from a chair using your arms (e.g., wheelchair or bedside chair)?: A Little Help needed to walk in hospital room?: A Little Help needed climbing 3-5 steps with a railing? : A Lot 6 Click Score: 19    End of Session Equipment Utilized During Treatment: Gait belt Activity Tolerance: Patient tolerated treatment well Patient left: in chair;with call bell/phone within reach;with chair alarm set Nurse Communication: Mobility status PT Visit Diagnosis: Unsteadiness on feet (R26.81);Muscle weakness (generalized) (M62.81);Difficulty in walking, not elsewhere classified (R26.2)    Time: [id number] PT Time Calculation (min) (ACUTE ONLY): 18 min   Charges:   PT Evaluation $PT Eval Low Complexity: 1 Low          Debe Coder PT Acute Rehabilitation Services Pager [phone] Office [phone]   Cavan Bearden 03/02/2019, 12:27 PM

## 2019-03-02 NOTE — Evaluation (Signed)
Occupational Therapy Evaluation Patient Details Name: Kathleen [name] MRN: [id number] DOB: 1955/02/06 Today's Date: 03/02/2019    History of Present Illness Pt admitted with c/o SOB 2* CAP and found to be in a-fib with RVR.  Pt with hx of bipolar, DM, MI, CAD, CHF,    Clinical Impression   Patient is a 64 year old female that lives alone in a single level home with 1 step to enter. At baseline patient is modified independent using cane during ambulation. Patient reports 3 falls within the past year. Patient min A ambulating with cane, min guard with use of rolling walker with increased stability. Patient reports using a walker would be manageable at home. Recommend continued acute OT services to maximize patient safety and independence with self care.    Follow Up Recommendations  Home health OT    Equipment Recommendations  Other (comment);Tub/shower seat(rolling walker)       Precautions / Restrictions Precautions Precautions: Fall Precaution Comments: Pt admits to multiple recent falls Restrictions Weight Bearing Restrictions: No      Mobility Bed Mobility               General bed mobility comments: seated in recliner upon arrival  Transfers Overall transfer level: Needs assistance Equipment used: Rolling walker (2 wheeled) Transfers: Sit to/from Stand Sit to Stand: Min assist         General transfer comment: verbal cues for hand placement    Balance Overall balance assessment: Needs assistance Sitting-balance support: No upper extremity supported;Feet supported Sitting balance-Leahy Scale: Good     Standing balance support: No upper extremity supported Standing balance-Leahy Scale: Fair                             ADL either performed or assessed with clinical judgement   ADL Overall ADL's : Needs assistance/impaired     Grooming: Set up;Sitting   Upper Body Bathing: Set up;Sitting   Lower Body Bathing: Min guard;Minimal assistance;Sit  to/from stand   Upper Body Dressing : Set up;Sitting   Lower Body Dressing: Minimal assistance;Sit to/from stand Lower Body Dressing Details (indicate cue type and reason): supervision at seated level with increased time to doff/don socks Toilet Transfer: Minimal assistance;Ambulation;RW;Comfort height toilet Toilet Transfer Details (indicate cue type and reason): simulated with functional transfer, min A for steadying  Toileting- Clothing Manipulation and Hygiene: Min guard;Minimal assistance;Sit to/from stand       Functional mobility during ADLs: Min guard                    Pertinent Vitals/Pain Pain Assessment: No/denies pain     Hand Dominance Right   Extremity/Trunk Assessment Upper Extremity Assessment Upper Extremity Assessment: Overall WFL for tasks assessed   Lower Extremity Assessment Lower Extremity Assessment: Defer to PT evaluation       Communication Communication Communication: No difficulties   Cognition Arousal/Alertness: Awake/alert Behavior During Therapy: WFL for tasks assessed/performed Overall Cognitive Status: Within Functional Limits for tasks assessed                                     General Comments  initially patient utilizing cane for ambulation however transitioned to walker with increased stability            Home Living Family/patient expects to be discharged to:: Private residence Living Arrangements: Alone  Type of Home: House Home Access: Stairs to enter [employer] of Steps: 1   Home Layout: One level     Bathroom Shower/Tub: Occupational psychologist: Handicapped height     Home Equipment: [place] - single point          Prior Functioning/Environment Level of Independence: Independent with assistive device(s)        Comments: Pt states she uses cane at all times but has had several recent falls        OT Problem List: Decreased activity tolerance;Impaired balance  (sitting and/or standing);Decreased safety awareness      OT Treatment/Interventions: Self-care/ADL training;Therapeutic exercise;DME and/or AE instruction;Therapeutic activities;Patient/family education;Balance training    OT Goals(Current goals can be found in the care plan section) Acute Rehab OT Goals Patient Stated Goal: Regain IND and return to previous living arrangement OT Goal Formulation: With patient Time For Goal Achievement: 03/16/19 Potential to Achieve Goals: Good  OT Frequency: Min 2X/week           Co-evaluation PT/OT/SLP Co-Evaluation/Treatment: Yes Reason for Co-Treatment: For patient/therapist safety;To address functional/ADL transfers PT goals addressed during session: Mobility/safety with mobility OT goals addressed during session: ADL's and self-care      AM-PAC OT "6 Clicks" Daily Activity     Outcome Measure Help from another person eating meals?: None Help from another person taking care of personal grooming?: A Little Help from another person toileting, which includes using toliet, bedpan, or urinal?: A Little Help from another person bathing (including washing, rinsing, drying)?: A Little Help from another person to put on and taking off regular upper body clothing?: A Little Help from another person to put on and taking off regular lower body clothing?: A Little 6 Click Score: 19   End of Session Equipment Utilized During Treatment: Gait belt;Rolling walker;Other (comment)(cane) Nurse Communication: Mobility status  Activity Tolerance: Patient tolerated treatment well Patient left: in chair;with call bell/phone within reach;with chair alarm set  OT Visit Diagnosis: Other abnormalities of gait and mobility (R26.89);History of falling (Z91.81)                Time: [id number] OT Time Calculation (min): 17 min Charges:  OT General Charges $OT Visit: 1 Visit OT Evaluation $OT Eval Low Complexity: [place] OT OT office:  [place] 03/02/2019, 1:15 PM

## 2019-03-02 NOTE — Progress Notes (Signed)
PROGRESS NOTE    Kathleen [name]  RUE:[id number] DOB: December 23, 1955 DOA: 02/28/2019 PCP: Darreld Mclean, MD    Brief Narrative:  HPI per Dr. Loletha Grayer is a 64 y.o. female with medical history significant of afib, bipolar, ckd, diastolic chf, cad, on xaralto, prior vte comes in with sob.  No cp.  No n/v/d.  Feeling palpitations.  No fever or chills.  Found to be in afib with rvr.  Cardioversion attempted in ED and unsuccessful, pt on xaralto.  On dilt drip and referred for admission for rate control. Started on doxy this past week for sinusitis her covid was neg.  Assessment & Plan:   Principal Problem:   Atrial fibrillation with RVR (HCC) Active Problems:   Bipolar disorder (HCC)   HYPERTENSION, BENIGN ESSENTIAL   Morbid obesity ([place])   Chronic diastolic heart failure (HCC)   Chronic kidney disease (CKD), stage III (moderate)   CAP (community acquired pneumonia)   AKI (acute kidney injury) ([place])  1 paroxysmal A. fib with RVR Patient noted to have come in with A. fib with RVR.  Cardioversion was attempted in the ED however unsuccessful.  Patient was on Cardizem drip at maximum dose and heart rate noted to be sustaining per RN in the 130s to the 140s on 03/01/2019.  Oral metoprolol given early yesterday with improvement with heart rate.  Patient subsequently converted to normal sinus rhythm yesterday afternoon around 2:50 PM.  Cardizem drip subsequently discontinued and patient was on oral Cardizem yesterday which has subsequently been discontinued.  Blood pressure was noted to be soft.  Patient still in normal sinus rhythm.  TSH of 8.129.  Free T4 within normal limits.  Continue Metroprolol for rate control per cardiology.  Cardizem is off.  Xarelto for anticoagulation.  Cardiology following and I appreciate their input and recommendations..   2.  Chest pain Patient with atypical right-sided chest pain likely secondary to probable community-acquired pneumonia.  Patient described chest  pain as burning as well as sharp in nature with no significant improvement since admission.  Patient with some chest wall tenderness to palpation on 03/01/2019.  Patient states chest pain more on the inside.  Continue scheduled ibuprofen for another 24 hours then change to as needed.  Continue IV Levaquin and transition to oral Levaquin tomorrow.  Xopenex MDI.  Supportive care.  3.  Probable community-acquired pneumonia, POA Patient with complaints of shortness of breath, right-sided chest pain, chest x-ray concerning for possible infiltrate.  CT angiogram chest negative for PE and concern for atelectasis versus possible underlying consolidation.  Patient currently afebrile.  Strep pneumococcus antigen negative.  Continue empiric IV Levaquin and transition to oral Levaquin tomorrow.  Incentive spirometry.  Add Xopenex MDI.  Supportive care.  4.  Hypertension Blood pressure noted to be soft this morning with systolic blood pressures in the 90s.  ACE inhibitor has been discontinued.  Patient off Cardizem.  Continue metoprolol and follow.    5.  Morbid obesity  6.  Chronic kidney disease stage IIIb Patient with slight bump in creatinine.  Patient noted to have soft blood pressure this morning with systolics in the 14N.  Diuretics on hold today.  ACE inhibitor discontinued per cardiology.  Follow.    7.  History of hypothyroidism Patient stated was on medication however was discontinued per PCP as it was felt her numbers had improved.  TSH at 8.129.  Free T4 0.92.  Will likely need repeat thyroid function studies in about 4  to 6 weeks.  Outpatient follow-up with PCP.   8.  Bipolar disorder Due to drug interaction with Cardizem Latuda was decreased to half home dose.  Cardizem discontinued.  Continue current dose of Latuda and if Cardizem is not resumed we will increase back to home dose.  Continue Depakote and Zyprexa.    9.  Essential tremors Per patient on Cogentin for this.   10.  History of  gout Allopurinol.  11.  History of chronic diastolic CHF Seems to be compensated.  Patient seems more on the dry side today.  Blood pressure soft with systolic blood pressures in the 90s.  Diuretics have been held for today per cardiology and ACE inhibitor discontinued.  Continue beta-blocker.  Per cardiology.    12.  Hyperlipidemia Continue statin.   DVT prophylaxis: Xarelto Code Status: Full Family Communication: Updated patient.  No family at bedside. Disposition Plan:  . Patient came from: Home            . Anticipated d/c place: Home . Barriers to d/c OR conditions which need to be met to effect a safe d/c: Home once clinically improved, rate controlled, on oral cardiac medications and when cleared by cardiology.   Consultants:   Cardiology: Dr. Radford Pax 03/01/2019  Procedures:   CT angiogram chest 02/28/2019  Chest x-ray 02/28/2019, 03/01/2019  Antimicrobials:   IV Levaquin 02/28/2019>>>>> 03/02/2019  Oral Levaquin 03/03/2019   Subjective: Patient sitting up in chair has just finished working with PT/OT.  Denies any significant shortness of breath.  Complaining of right-sided pleuritic chest pain which she states is on the inside.  Patient noted to convert to normal sinus rhythm yesterday afternoon around 2:50 PM and currently normal sinus rhythm.  Blood pressure noted to be soft this morning with systolics in the 77O.  Objective: Vitals:   03/02/19 0400 03/02/19 0500 03/02/19 0606 03/02/19 0800  BP: (!) 99/48  (!) 93/54 125/81  Pulse: 66  67 80  Resp:      Temp:      TempSrc:      SpO2: 92%  98% 96%  Weight:  (!) 146 kg    Height:        Intake/Output Summary (Last 24 hours) at 03/02/2019 0910 Last data filed at 03/02/2019 0300 Gross per 24 hour  Intake 831.11 ml  Output 1325 ml  Net -493.89 ml   Filed Weights   02/28/19 1636 03/01/19 0733 03/02/19 0500  Weight: (!) 147 kg (!) 144.3 kg (!) 146 kg    Examination:  General exam: Appears calm and comfortable.  Dry  mucous membranes. Respiratory system: Decreased coarse breath sounds on the right.  No crackles noted.  No wheezing.  Speaking in full sentences.  Normal respiratory effort. Cardiovascular system: Regular rate rhythm no murmurs rubs or gallops.  No JVD.  Trace bilateral lower extremity edema.  Right chest wall nontender to palpation today.  No lesions noted on right chest wall.   Gastrointestinal system: Abdomen is soft, nontender, nondistended, positive bowel sounds.  No rebound.  No guarding.  Central nervous system: Alert and oriented. No focal neurological deficits. Extremities: Trace bilateral lower extremity edema.  Symmetric 5 x 5 power. Skin: Chronic venous stasis changes noted on lower extremities.  Psychiatry: Judgement and insight appear normal. Mood & affect appropriate.     Data Reviewed: I have personally reviewed following labs and imaging studies  CBC: Recent Labs  Lab 02/28/19 1739 03/01/19 0146 03/02/19 0331  WBC 6.9 7.2 4.8  NEUTROABS 4.9 5.0 2.4  HGB 15.1* 15.2* 13.4  HCT 46.4* 46.0 40.8  MCV 90.3 90.2 92.1  PLT 117* 112* 956*   Basic Metabolic Panel: Recent Labs  Lab 02/28/19 1739 03/01/19 0146 03/02/19 0331  NA 136 139 136  K 4.1 3.8 4.0  CL 98 100 99  CO2 '28 27 26  ' GLUCOSE 153* 130* 126*  BUN 35* 30* 30*  CREATININE 1.48* 1.40* 1.84*  CALCIUM 8.9 8.9 8.6*  MG  --  2.1 2.3   GFR: Estimated Creatinine Clearance: 50.5 mL/min (A) (by C-G formula based on SCr of 1.84 mg/dL (H)). Liver Function Tests: Recent Labs  Lab 02/28/19 1739  AST 21  ALT 23  ALKPHOS 120  BILITOT 0.8  PROT 6.7  ALBUMIN 3.9   No results for input(s): LIPASE, AMYLASE in the last 168 hours. No results for input(s): AMMONIA in the last 168 hours. Coagulation Profile: No results for input(s): INR, PROTIME in the last 168 hours. Cardiac Enzymes: No results for input(s): CKTOTAL, CKMB, CKMBINDEX, TROPONINI in the last 168 hours. BNP (last 3 results) No results for  input(s): PROBNP in the last 8760 hours. HbA1C: Recent Labs    03/01/19 0853  HGBA1C 6.8*   CBG: Recent Labs  Lab 03/01/19 0840 03/01/19 1134 03/01/19 1603 03/01/19 2106 03/02/19 0805  GLUCAP 144* 186* 156* 153* 119*   Lipid Profile: No results for input(s): CHOL, HDL, LDLCALC, TRIG, CHOLHDL, LDLDIRECT in the last 72 hours. Thyroid Function Tests: Recent Labs    03/01/19 0853  TSH 8.129*  FREET4 0.92   Anemia Panel: No results for input(s): VITAMINB12, FOLATE, FERRITIN, TIBC, IRON, RETICCTPCT in the last 72 hours. Sepsis Labs: Recent Labs  Lab 02/28/19 2217 03/01/19 0146 03/01/19 0531  PROCALCITON  --  <0.10  --   LATICACIDVEN 1.4  --  1.7    Recent Results (from the past 240 hour(s))  Novel Coronavirus, NAA ([facility])     Status: None   Collection Time: 02/24/19  1:38 PM   Specimen: Nasopharyngeal(NP) swabs in vial transport medium   NASOPHARYNGE  TESTING  Result Value Ref Range Status   SARS-CoV-2, NAA Not Detected Not Detected Final    Comment: This nucleic acid amplification test was developed and its performance characteristics determined by [employer]. Nucleic acid amplification tests include RT-PCR and TMA. This test has not been FDA cleared or approved. This test has been authorized by FDA under an Emergency Use Authorization (EUA). This test is only authorized for the duration of time the declaration that circumstances exist justifying the authorization of the emergency use of in vitro diagnostic tests for detection of SARS-CoV-2 virus and/or diagnosis of COVID-19 infection under section 564(b)(1) of the Act, 21 U.S.C. 213YQM-5(H) (1), unless the authorization is terminated or revoked sooner. When diagnostic testing is negative, the possibility of a false negative result should be considered in the context of a patient's recent exposures and the presence of clinical signs and symptoms consistent with COVID-19. An individual without symptoms  of COVID-19 and who is not shedding SARS-CoV-2 virus wo uld expect to have a negative (not detected) result in this assay.   Blood culture (routine x 2)     Status: None (Preliminary result)   Collection Time: 02/28/19 10:17 PM   Specimen: BLOOD LEFT ARM  Result Value Ref Range Status   Specimen Description   Final    BLOOD LEFT ARM Performed at [facility], [address], [place], [place] [address]  Special Requests   Final    BOTTLES DRAWN AEROBIC AND ANAEROBIC Blood Culture adequate volume Performed at [facility], [address] [place], [place] [address]    Culture   Final    NO GROWTH 2 DAYS Performed at [facility], [place] [address], [place], [place] [address]    Report Status PENDING  Incomplete  SARS Coronavirus 2 by RT PCR (hospital order, performed in [facility] hospital lab) Nasopharyngeal Nasopharyngeal Swab     Status: None   Collection Time: 02/28/19 10:17 PM   Specimen: Nasopharyngeal Swab  Result Value Ref Range Status   SARS Coronavirus 2 NEGATIVE NEGATIVE Final    Comment: Performed at [facility], [address] [place], [place] [address]  Blood culture (routine x 2)     Status: None (Preliminary result)   Collection Time: 02/28/19 10:30 PM   Specimen: BLOOD RIGHT ARM  Result Value Ref Range Status   Specimen Description   Final    BLOOD RIGHT ARM Performed at [facility], [address] [place], [place] [address]    Special Requests   Final    BOTTLES DRAWN AEROBIC AND ANAEROBIC Blood Culture adequate volume Performed at [facility], [address] [place], [place] [address]    Culture   Final    NO GROWTH 2 DAYS Performed at [facility], [place] [address] [place], [place] [address]    Report Status PENDING  Incomplete  MRSA PCR Screening     Status: None   Collection Time: 03/01/19  2:00 AM   Specimen: Nasopharyngeal  Result Value Ref Range Status   MRSA by PCR NEGATIVE  NEGATIVE Final    Comment:        The GeneXpert MRSA Assay (FDA approved for NASAL specimens only), is one component of a comprehensive MRSA colonization surveillance program. It is not intended to diagnose MRSA infection nor to guide or monitor treatment for MRSA infections. Performed at [facility], [place] [address], [place], [place] [address]          Radiology Studies: DG Chest 2 View  Result Date: 02/28/2019 CLINICAL DATA:  Shortness of breath EXAM: CHEST - 2 VIEW COMPARISON:  Radiograph 12/11/2016, CT 08/05/2015 FINDINGS: Stable bandlike opacity in the left lung base compatible with scarring. Hazy opacities in the right mid to infrahilar lung are new from comparison. No focally consolidative opacity, convincing features of edema, pneumothorax, or effusion i. Cardiomediastinal contours are stable. No acute osseous or soft tissue abnormality. IMPRESSION: Some hazy opacity in the right mid to infrahilar lung could reflect atelectasis or early infection in the appropriate clinical context. Stable scarring in the left lung base. Electronically Signed   By: Lovena Le M.D.   On: 02/28/2019 17:22   CT Angio Chest PE W and/or Wo Contrast  Result Date: 02/28/2019 CLINICAL DATA:  Shortness of breath, bilateral leg swelling, history of DVT and PE EXAM: CT ANGIOGRAPHY CHEST WITH CONTRAST TECHNIQUE: Multidetector CT imaging of the chest was performed using the standard protocol during bolus administration of intravenous contrast. Multiplanar CT image reconstructions and MIPs were obtained to evaluate the vascular anatomy. CONTRAST:  36m OMNIPAQUE IOHEXOL 350 MG/ML SOLN COMPARISON:  CT 08/05/2015 FINDINGS: Cardiovascular: There is markedly suboptimal opacification of the pulmonary arteries. No large central filling defects are seen. More distal evaluation is precluded by poor contrast bolus timing. Borderline dilation of the central pulmonary  arteries is similar to prior. The  aortic root is suboptimally assessed given cardiac pulsation artifact. Atherosclerotic plaque within the normal caliber aorta. No acute luminal abnormality, periaortic stranding or hemorrhage is seen. Normal 3 vessel branching of the aortic arch. Minimal plaque in the proximal great vessels Normal heart size. No pericardial effusion. Coronary artery calcifications are present. Mediastinum/Nodes: Calcified right hilar lymph nodes are present. No pathologically enlarged mediastinal, hilar or axillary adenopathy. No mediastinal fluid or gas. Partially calcified 1.1 cm nodule extending from the left inferior thyroid lobe, does not warrant further evaluation in a patient of this age. This follows consensus guidelines: Managing Incidental Thyroid Nodules Detected on Imaging: White Paper of the ACR Incidental Thyroid Findings Committee. J Am Coll Radiol 2015; 12:143-150. and [employer] 3-tiered system for managing ITNs: J Am Coll Radiol. 2015; Feb;12(2): 143-50. No acute abnormality of the trachea or esophagus. Lungs/Pleura: There is a small right pleural effusion. Some opacity in the right lung base likely reflects atelectasis though some underlying consolidation is not excluded. No other focal process is seen. No suspicious nodules or masses. No pneumothorax or left pleural effusion. Scattered punctate calcified granulomata are present within the right lung. Upper Abdomen: No acute abnormalities present in the visualized portions of the upper abdomen. Small exophytic fluid attenuation cyst arising from the upper pole left kidney measuring 13 mm in size (4/94). Musculoskeletal: Multilevel degenerative changes are present in the imaged portions of the spine. Multilevel flowing anterior osteophytosis, compatible with features of diffuse idiopathic skeletal hyperostosis (DISH). No acute osseous abnormality or suspicious osseous lesion. No suspicious chest wall lesions. Review of the MIP images confirms the above findings.  IMPRESSION: 1. Markedly suboptimal opacification of the pulmonary arteries. No large central filling defects are seen. More distal evaluation is precluded by poor contrast bolus timing. 2. Small right pleural effusion with right basilar atelectatic change though some underlying consolidation may be present in the appropriate clinical setting. 3. Evidence of prior granulomatous disease with multiple right calcified hilar nodes and few punctate calcified granulomata at lung parenchyma. 4. Coronary artery calcifications. 5. Aortic Atherosclerosis (ICD10-I70.0). Electronically Signed   By: Lovena Le M.D.   On: 02/28/2019 19:55   DG CHEST PORT 1 VIEW  Result Date: 03/01/2019 CLINICAL DATA:  Hypoxia. Postop day 1 RIGHT rotator cuff repair and subacromial decompression. EXAM: PORTABLE CHEST 1 VIEW COMPARISON:  02/28/2019 and earlier, including CTA chest 02/28/2019. FINDINGS: Cardiac silhouette normal in size, unchanged. Worsening bibasilar atelectasis since yesterday. No new pulmonary parenchymal abnormalities elsewhere. Normal pulmonary vascularity. Small RIGHT pleural effusion, unchanged. IMPRESSION: 1. Worsening bibasilar atelectasis since yesterday. 2. Stable small RIGHT pleural effusion. 3. No new abnormalities elsewhere. Electronically Signed   By: Evangeline Dakin M.D.   On: 03/01/2019 09:41   ECHOCARDIOGRAM COMPLETE  Result Date: 03/01/2019   ECHOCARDIOGRAM REPORT   Patient Name:   CATHERINA PATES Date of Exam: 03/01/2019 Medical Rec #:  [id number]     Height:       72.0 in Accession #:    [id number]    Weight:       318.1 lb Date of Birth:  March 12, 1955      BSA:          2.60 m Patient Age:    46 years      BP:           100/47 mmHg Patient Gender: F             HR:  37 bpm. Exam Location:  Inpatient Procedure: 2D Echo Indications:    Atrial Fibrillation 427.31 / I48.91  History:        Patient has prior history of Echocardiogram examinations, most                 recent 08/07/2017. Previous Myocardial  Infarction, COPD,                 Signs/Symptoms:Shortness of Breath; Risk Factors:Hypertension,                 Diabetes and Morbid obesity. CKD.  Sonographer:    Vikki Ports Turrentine Referring Phys: [place]  1. Left ventricular ejection fraction, by visual estimation, is 60 to 65%. The left ventricle has normal function. There is no left ventricular hypertrophy.  2. The left ventricle has no regional wall motion abnormalities.  3. Left ventricular diastolic function could not be evaluated secondary to atrial fibrillation.  4. Global right ventricle has normal systolic function.The right ventricular size is normal. No increase in right ventricular wall thickness.  5. Left atrial size was normal.  6. Right atrial size was normal.  7. The mitral valve is normal in structure. No evidence of mitral valve regurgitation. No evidence of mitral stenosis.  8. The tricuspid valve is normal in structure. Tricuspid valve regurgitation is mild.  9. The aortic valve is normal in structure. Aortic valve regurgitation is mild. Moderate aortic valve sclerosis/calcification without any evidence of aortic stenosis. 10. The pulmonic valve was normal in structure. Pulmonic valve regurgitation is mild. 11. Normal pulmonary artery systolic pressure. 12. The inferior vena cava is normal in size with greater than 50% respiratory variability, suggesting right atrial pressure of 3 mmHg. FINDINGS  Left Ventricle: Left ventricular ejection fraction, by visual estimation, is 60 to 65%. The left ventricle has normal function. The left ventricle has no regional wall motion abnormalities. There is no left ventricular hypertrophy. The left ventricular diastology could not be evaluated due to atrial fibrillation. Left ventricular diastolic function could not be evaluated. Normal left atrial pressure. Right Ventricle: The right ventricular size is normal. No increase in right ventricular wall thickness. Global RV systolic  function is has normal systolic function. The tricuspid regurgitant velocity is 2.23 m/s, and with an assumed right atrial pressure  of 3 mmHg, the estimated right ventricular systolic pressure is normal at 22.9 mmHg. Left Atrium: Left atrial size was normal in size. Right Atrium: Right atrial size was normal in size Pericardium: There is no evidence of pericardial effusion. Mitral Valve: The mitral valve is normal in structure. No evidence of mitral valve regurgitation. No evidence of mitral valve stenosis by observation. Tricuspid Valve: The tricuspid valve is normal in structure. Tricuspid valve regurgitation is mild. Aortic Valve: The aortic valve is normal in structure. Aortic valve regurgitation is mild. Mild to moderate aortic valve sclerosis/calcification is present, without any evidence of aortic stenosis. Pulmonic Valve: The pulmonic valve was normal in structure. Pulmonic valve regurgitation is mild. Pulmonic regurgitation is mild. Aorta: The aortic root, ascending aorta and aortic arch are all structurally normal, with no evidence of dilitation or obstruction. Venous: The inferior vena cava is normal in size with greater than 50% respiratory variability, suggesting right atrial pressure of 3 mmHg. IAS/Shunts: No atrial level shunt detected by color flow Doppler. There is no evidence of a patent foramen ovale. No ventricular septal defect is seen or detected. There is no evidence of an atrial septal defect.  LEFT VENTRICLE PLAX 2D LVIDd:         3.70 cm LVIDs:         2.70 cm LV PW:         0.90 cm LV IVS:        0.90 cm LVOT diam:     2.00 cm LV SV:         31 ml LV SV Index:   11.22 LVOT Area:     3.14 cm  RIGHT VENTRICLE TAPSE (M-mode): 1.6 cm LEFT ATRIUM           Index       RIGHT ATRIUM           Index LA diam:      3.70 cm 1.43 cm/m  RA Area:     15.50 cm LA Vol (A2C): 28.8 ml 11.10 ml/m RA Volume:   39.60 ml  15.26 ml/m LA Vol (A4C): 65.7 ml 25.32 ml/m  AORTIC VALVE LVOT Vmax:   87.83 cm/s  LVOT Vmean:  62.800 cm/s LVOT VTI:    0.139 m  AORTA Ao Root diam: 2.90 cm TRICUSPID VALVE TR Peak grad:   19.9 mmHg TR Vmax:        223.00 cm/s  SHUNTS Systemic VTI:  0.14 m Systemic Diam: 2.00 cm  Fransico Him MD Electronically signed by Fransico Him MD Signature Date/Time: 03/01/2019/2:09:00 PM    Final         Scheduled Meds: . allopurinol  200 mg Oral Daily  . benztropine  1 mg Oral BID  . Chlorhexidine Gluconate Cloth  6 each Topical Daily  . divalproex  1,500 mg Oral QHS  . ibuprofen  600 mg Oral TID  . insulin aspart  0-15 Units Subcutaneous TID WC  . Lurasidone HCl  30 mg Oral QHS  . mouth rinse  15 mL Mouth Rinse BID  . metoprolol tartrate  25 mg Oral BID  . OLANZapine  15 mg Oral QHS  . pantoprazole  40 mg Oral Q0600  . rivaroxaban  20 mg Oral q1800  . rosuvastatin  20 mg Oral Daily  . sodium chloride flush  3 mL Intravenous Q12H   Continuous Infusions: . sodium chloride    . levofloxacin (LEVAQUIN) IV Stopped (03/01/19 1106)     LOS: 1 day    Time spent: 40 minutes  No charge    Irine Seal, MD [facility]   To contact the attending provider between 7A-7P or the covering provider during after hours 7P-7A, please log into the web site [email or web address] and access using universal [place] password for that web site. If you do not have the password, please call the hospital operator.  03/02/2019, 9:10 AM

## 2019-03-03 ENCOUNTER — Telehealth: Payer: Self-pay | Admitting: Family Medicine

## 2019-03-03 DIAGNOSIS — R195 Other fecal abnormalities: Secondary | ICD-10-CM

## 2019-03-03 LAB — BASIC METABOLIC PANEL
Anion gap: 11 (ref 5–15)
BUN: 33 mg/dL — ABNORMAL HIGH (ref 8–23)
CO2: 24 mmol/L (ref 22–32)
Calcium: 8.3 mg/dL — ABNORMAL LOW (ref 8.9–10.3)
Chloride: 97 mmol/L — ABNORMAL LOW (ref 98–111)
Creatinine, Ser: 2.09 mg/dL — ABNORMAL HIGH (ref 0.44–1.00)
GFR calc Af Amer: 28 mL/min — ABNORMAL LOW (ref >60)
GFR calc non Af Amer: 25 mL/min — ABNORMAL LOW (ref >60)
Glucose, Bld: 106 mg/dL — ABNORMAL HIGH (ref 70–99)
Potassium: 4.1 mmol/L (ref 3.5–5.1)
Sodium: 132 mmol/L — ABNORMAL LOW (ref 135–145)

## 2019-03-03 LAB — CBC WITH DIFFERENTIAL/PLATELET
Abs Immature Granulocytes: 0.04 10*3/uL (ref 0.00–0.07)
Basophils Absolute: 0 10*3/uL (ref 0.0–0.1)
Basophils Relative: 1 %
Eosinophils Absolute: 0.2 10*3/uL (ref 0.0–0.5)
Eosinophils Relative: 3 %
HCT: 39.1 % (ref 36.0–46.0)
Hemoglobin: 13 g/dL (ref 12.0–15.0)
Immature Granulocytes: 1 %
Lymphocytes Relative: 27 %
Lymphs Abs: 1.6 10*3/uL (ref 0.7–4.0)
MCH: 30.4 pg (ref 26.0–34.0)
MCHC: 33.2 g/dL (ref 30.0–36.0)
MCV: 91.6 fL (ref 80.0–100.0)
Monocytes Absolute: 0.5 10*3/uL (ref 0.1–1.0)
Monocytes Relative: 8 %
Neutro Abs: 3.6 10*3/uL (ref 1.7–7.7)
Neutrophils Relative %: 60 %
Platelets: 102 10*3/uL — ABNORMAL LOW (ref 150–400)
RBC: 4.27 MIL/uL (ref 3.87–5.11)
RDW: 14 % (ref 11.5–15.5)
WBC: 5.9 10*3/uL (ref 4.0–10.5)
nRBC: 0 % (ref 0.0–0.2)

## 2019-03-03 LAB — URINALYSIS, ROUTINE W REFLEX MICROSCOPIC
Bilirubin Urine: NEGATIVE
Glucose, UA: 50 mg/dL — AB
Ketones, ur: NEGATIVE mg/dL
Nitrite: NEGATIVE
Protein, ur: NEGATIVE mg/dL
Specific Gravity, Urine: 1.011 (ref 1.005–1.030)
pH: 5 (ref 5.0–8.0)

## 2019-03-03 LAB — SODIUM, URINE, RANDOM: Sodium, Ur: 17 mmol/L

## 2019-03-03 LAB — GLUCOSE, CAPILLARY
Glucose-Capillary: 97 mg/dL (ref 70–99)
Glucose-Capillary: 159 mg/dL — ABNORMAL HIGH (ref 70–99)
Glucose-Capillary: 144 mg/dL — ABNORMAL HIGH (ref 70–99)
Glucose-Capillary: 136 mg/dL — ABNORMAL HIGH (ref 70–99)

## 2019-03-03 LAB — CREATININE, URINE, RANDOM: Creatinine, Urine: 99 mg/dL

## 2019-03-03 MED ORDER — LURASIDONE HCL 60 MG PO TABS
60.0000 mg | ORAL_TABLET | Freq: Every day | ORAL | Status: DC
  Filled 2019-03-03: qty 1

## 2019-03-03 MED ORDER — IBUPROFEN 200 MG PO TABS: 400.0000 mg | ORAL_TABLET | Freq: Four times a day (QID) | ORAL | Status: DC | PRN

## 2019-03-03 MED ORDER — LURASIDONE HCL 40 MG PO TABS
60.0000 mg | ORAL_TABLET | Freq: Every day | ORAL | Status: DC
  Administered 2019-03-03: 21:00:00 60 mg via ORAL
  Filled 2019-03-03 (×2): qty 1

## 2019-03-03 MED ORDER — LEVALBUTEROL HCL 0.63 MG/3ML IN NEBU: 0.6300 mg | INHALATION_SOLUTION | Freq: Four times a day (QID) | RESPIRATORY_TRACT | Status: DC | PRN

## 2019-03-03 MED ORDER — LEVOFLOXACIN 500 MG PO TABS
250.0000 mg | ORAL_TABLET | Freq: Every day | ORAL | Status: DC
  Administered 2019-03-03: 250 mg via ORAL
  Filled 2019-03-03: qty 1

## 2019-03-03 MED ORDER — GUAIFENESIN ER 600 MG PO TB12
1200.0000 mg | ORAL_TABLET | Freq: Two times a day (BID) | ORAL | Status: DC
  Administered 2019-03-03 – 2019-03-04 (×3): 1200 mg via ORAL
  Filled 2019-03-03 (×3): qty 2

## 2019-03-03 MED ORDER — SODIUM CHLORIDE 0.9 % IV SOLN: INTRAVENOUS | Status: AC

## 2019-03-03 NOTE — TOC Initial Note (Addendum)
Transition of Care [facility]) - Initial/Assessment Note    Patient Details  Name: Kathleen [name] MRN: [id number] Date of Birth: March 20, 1955  [facility]) CM/SW Contact:    Leeroy Cha, RN Phone Number: 03/03/2019, 10:52 AM  Clinical Narrative:                 Rolling wlaker ordered through [employer] and ot through [place].  Expected Discharge Plan: [place] Barriers to Discharge: Continued Medical Work up   Patient Goals and CMS Choice     Choice offered to / list presented to : Patient  Expected Discharge Plan and Services Expected Discharge Plan: [place]   Discharge Planning Services: CM Consult Post Acute Care Choice: Durable Medical Equipment, Home Health Living arrangements for the past 2 months: Single Family Home                 DME Arranged: [place] rolling DME Agency: [employer] Date DME Agency Contacted: 03/03/19 Time DME Agency Contacted: 6 Representative spoke with at DME Agency: zack blank HH Arranged: PT, OT [place] Agency: [facility] (formerly [employer]) Date [place]: 03/03/19 Time [place]: 1 Representative spoke with at [place]: [place] Arrangements/Services Living arrangements for the past 2 months: [place] with:: Self Patient language and need for interpreter reviewed:: No Do you feel safe going back to the place where you live?: Yes      Need for Family Participation in Patient Care: Yes (Comment) Care giver support system in place?: Yes (comment)   Criminal Activity/Legal Involvement Pertinent to Current Situation/Hospitalization: No - Comment as needed  Activities of Daily Living Home Assistive Devices/Equipment: Cane (specify quad or straight), Eyeglasses ADL Screening (condition at time of admission) Patient's cognitive ability adequate to safely complete daily activities?: Yes Is the patient deaf or have difficulty hearing?:  No Does the patient have difficulty seeing, even when wearing glasses/contacts?: No Does the patient have difficulty concentrating, remembering, or making decisions?: No Patient able to express need for assistance with ADLs?: Yes Does the patient have difficulty dressing or bathing?: Yes Independently performs ADLs?: Yes (appropriate for developmental age) Does the patient have difficulty walking or climbing stairs?: Yes(Gets SOB ) Weakness of Legs: Both Weakness of Arms/Hands: Both  Permission Sought/Granted                  Emotional Assessment Appearance:: Appears stated age     Orientation: : Oriented to Self, Oriented to Place, Oriented to  Time, Oriented to Situation Alcohol / Substance Use: Not Applicable Psych Involvement: No (comment)  Admission diagnosis:  Atrial fibrillation with RVR ([name]) [I48.91] Community acquired pneumonia of right lower lobe of lung [J18.9] Patient Active Problem List   Diagnosis Date Noted  . AKI (acute kidney injury) ([place])   . CAP (community acquired pneumonia) 03/01/2019  . Atrial fibrillation with RVR ([place]) 02/28/2019  . Chronic kidney disease (CKD), stage III (moderate) 01/14/2019  . Vitamin D deficiency 07/22/2018  . Complete rotator cuff tear or rupture of right shoulder, not specified as traumatic 02/27/2017  . Auditory hallucination 12/13/2016  . Tremor of both hands 11/20/2016  . Gout 06/29/2016  . Type 2 diabetes mellitus ([place]) 12/31/2015  . Chest pain with moderate risk for cardiac etiology 12/29/2015  . Coronary artery dissection   . Pure hypercholesterolemia   . Lung nodule seen on imaging study 09/24/2014  . Paroxysmal atrial  fibrillation ([place]) 09/11/2014  . Asthmatic bronchitis with exacerbation 05/25/2014  . Primary hyperparathyroidism ([place]) 04/15/2014  . Palpitations 03/20/2014  . Radicular low back pain 11/24/2013  . Chronic diastolic heart failure ([place]) 10/02/2013  . Shortness of breath 08/31/2013  . Venous stasis  dermatitis of both lower extremities 08/19/2013  . Peripheral edema 06/12/2013  . Other malaise and fatigue 06/12/2013  . Nocturia 06/12/2013  . Cellulitis of leg, right 10/11/2012  . Morbid obesity ([place]) 08/09/2012  . Urinary incontinence, urge 07/11/2012  . CAD (coronary artery disease) 10/31/2010  . NEOPLASM OF UNCERTAIN BEHAVIOR OF SKIN 02/11/2010  . Bipolar disorder ([place]) 09/14/2009  . ACUT MYOCARD INFARCT UNS SITE SUBSQT EPIS CARE 07/02/2008  . EDEMA 06/23/2008  . Hyperlipidemia 05/23/2008  . UNSPECIFIED DISORDER OF THYROID 02/04/2008  . Obesity 02/04/2008  . HYPERTENSION, BENIGN ESSENTIAL 02/04/2008  . MYOCARDIAL INFARCTION, HX OF 03/05/2007   PCP:  Darreld Mclean, MD Pharmacy:   [facility] [place], [address] [place] [address] Phone: [phone] Fax: [phone]     Social Determinants of Health (SDOH) Interventions    Readmission Risk Interventions No flowsheet data found.

## 2019-03-03 NOTE — Progress Notes (Addendum)
Progress Note  Patient Name: Kathleen [name] Date of Encounter: 03/03/2019  Primary Cardiologist: Glori Bickers, MD  AHF:  Dr. [name]  Subjective   No chest pain or SOB, feels better today   Inpatient Medications    Scheduled Meds: . allopurinol  200 mg Oral Daily  . benztropine  1 mg Oral BID  . Chlorhexidine Gluconate Cloth  6 each Topical Daily  . divalproex  1,500 mg Oral QHS  . ibuprofen  600 mg Oral TID  . insulin aspart  0-15 Units Subcutaneous TID WC  . levalbuterol  0.63 mg Nebulization TID  . Lurasidone HCl  30 mg Oral QHS  . mouth rinse  15 mL Mouth Rinse BID  . metoprolol tartrate  25 mg Oral BID  . OLANZapine  15 mg Oral QHS  . pantoprazole  40 mg Oral Q0600  . rivaroxaban  20 mg Oral q1800  . rosuvastatin  20 mg Oral Daily  . sodium chloride flush  3 mL Intravenous Q12H   Continuous Infusions: . sodium chloride    . levofloxacin (LEVAQUIN) IV Stopped (03/02/19 1205)   PRN Meds: sodium chloride, sodium chloride flush   Vital Signs    Vitals:   03/03/19 0315 03/03/19 0316 03/03/19 0751 03/03/19 0800  BP:    107/63  Pulse:    69  Resp:    17  Temp:    97.7 F (36.5 C)  TempSrc:    Oral  SpO2: 93% 94% 96% 94%  Weight:      Height:        Intake/Output Summary (Last 24 hours) at 03/03/2019 0835 Last data filed at 03/03/2019 0550 Gross per 24 hour  Intake 113 ml  Output 1450 ml  Net -1337 ml   Last 3 Weights 03/02/2019 03/01/2019 02/28/2019  Weight (lbs) 321 lb 14 oz 318 lb 2 oz 324 lb  Weight (kg) 146 kg 144.3 kg 146.965 kg      Telemetry    SR  - Personally Reviewed  ECG    No new - Personally Reviewed  Physical Exam   GEN: No acute distress.   Neck: No JVD sitting up Cardiac: RRR, no murmurs, rubs, or gallops.  Respiratory: Clear to auscultation bilaterally. GI: Soft, nontender, non-distended  MS: 1+ lower ext edema; No deformity. Neuro:  Nonfocal  Psych: Normal affect   Labs    High Sensitivity Troponin:   Recent Labs   Lab 02/28/19 1657 02/28/19 1957  TROPONINIHS <2 2      Chemistry Recent Labs  Lab 02/28/19 1739 02/28/19 1739 03/01/19 0146 03/02/19 0331 03/03/19 0329  NA 136   < > 139 136 132*  K 4.1   < > 3.8 4.0 4.1  CL 98   < > 100 99 97*  CO2 28   < > 27 26 24   GLUCOSE 153*   < > 130* 126* 106*  BUN 35*   < > 30* 30* 33*  CREATININE 1.48*   < > 1.40* 1.84* 2.09*  CALCIUM 8.9   < > 8.9 8.6* 8.3*  PROT 6.7  --   --   --   --   ALBUMIN 3.9  --   --   --   --   AST 21  --   --   --   --   ALT 23  --   --   --   --   ALKPHOS 120  --   --   --   --  BILITOT 0.8  --   --   --   --   GFRNONAA 37*   < > 40* 29* 25*  GFRAA 43*   < > 46* 33* 28*  ANIONGAP 10   < > 12 11 11    < > = values in this interval not displayed.     Hematology Recent Labs  Lab 03/01/19 0146 03/02/19 0331 03/03/19 0329  WBC 7.2 4.8 5.9  RBC 5.10 4.43 4.27  HGB 15.2* 13.4 13.0  HCT 46.0 40.8 39.1  MCV 90.2 92.1 91.6  MCH 29.8 30.2 30.4  MCHC 33.0 32.8 33.2  RDW 14.0 14.3 14.0  PLT 112* 105* 102*    BNP Recent Labs  Lab 02/28/19 1657 03/01/19 0146 03/01/19 0853  BNP 117.1* 102.3* 282.5*     DDimer No results for input(s): DDIMER in the last 168 hours.   Radiology    DG CHEST PORT 1 VIEW  Result Date: 03/01/2019 CLINICAL DATA:  Hypoxia. Postop day 1 RIGHT rotator cuff repair and subacromial decompression. EXAM: PORTABLE CHEST 1 VIEW COMPARISON:  02/28/2019 and earlier, including CTA chest 02/28/2019. FINDINGS: Cardiac silhouette normal in size, unchanged. Worsening bibasilar atelectasis since yesterday. No new pulmonary parenchymal abnormalities elsewhere. Normal pulmonary vascularity. Small RIGHT pleural effusion, unchanged. IMPRESSION: 1. Worsening bibasilar atelectasis since yesterday. 2. Stable small RIGHT pleural effusion. 3. No new abnormalities elsewhere. Electronically Signed   By: Evangeline Dakin M.D.   On: 03/01/2019 09:41   ECHOCARDIOGRAM COMPLETE  Result Date: 03/01/2019    ECHOCARDIOGRAM REPORT   Patient Name:   Kathleen [name] Date of Exam: 03/01/2019 Medical Rec #:  [id number]     Height:       72.0 in Accession #:    [id number]    Weight:       318.1 lb Date of Birth:  1955-08-11      BSA:          2.60 m Patient Age:    64 years      BP:           100/47 mmHg Patient Gender: F             HR:           37 bpm. Exam Location:  Inpatient Procedure: 2D Echo Indications:    Atrial Fibrillation 427.31 / I48.91  History:        Patient has prior history of Echocardiogram examinations, most                 recent 08/07/2017. Previous Myocardial Infarction, COPD,                 Signs/Symptoms:Shortness of Breath; Risk Factors:Hypertension,                 Diabetes and Morbid obesity. CKD.  Sonographer:    Vikki Ports Turrentine Referring Phys: [place]  1. Left ventricular ejection fraction, by visual estimation, is 60 to 65%. The left ventricle has normal function. There is no left ventricular hypertrophy.  2. The left ventricle has no regional wall motion abnormalities.  3. Left ventricular diastolic function could not be evaluated secondary to atrial fibrillation.  4. Global right ventricle has normal systolic function.The right ventricular size is normal. No increase in right ventricular wall thickness.  5. Left atrial size was normal.  6. Right atrial size was normal.  7. The mitral valve is normal in structure. No evidence of mitral valve regurgitation. No evidence  of mitral stenosis.  8. The tricuspid valve is normal in structure. Tricuspid valve regurgitation is mild.  9. The aortic valve is normal in structure. Aortic valve regurgitation is mild. Moderate aortic valve sclerosis/calcification without any evidence of aortic stenosis. 10. The pulmonic valve was normal in structure. Pulmonic valve regurgitation is mild. 11. Normal pulmonary artery systolic pressure. 12. The inferior vena cava is normal in size with greater than 50% respiratory variability, suggesting  right atrial pressure of 3 mmHg. FINDINGS  Left Ventricle: Left ventricular ejection fraction, by visual estimation, is 60 to 65%. The left ventricle has normal function. The left ventricle has no regional wall motion abnormalities. There is no left ventricular hypertrophy. The left ventricular diastology could not be evaluated due to atrial fibrillation. Left ventricular diastolic function could not be evaluated. Normal left atrial pressure. Right Ventricle: The right ventricular size is normal. No increase in right ventricular wall thickness. Global RV systolic function is has normal systolic function. The tricuspid regurgitant velocity is 2.23 m/s, and with an assumed right atrial pressure  of 3 mmHg, the estimated right ventricular systolic pressure is normal at 22.9 mmHg. Left Atrium: Left atrial size was normal in size. Right Atrium: Right atrial size was normal in size Pericardium: There is no evidence of pericardial effusion. Mitral Valve: The mitral valve is normal in structure. No evidence of mitral valve regurgitation. No evidence of mitral valve stenosis by observation. Tricuspid Valve: The tricuspid valve is normal in structure. Tricuspid valve regurgitation is mild. Aortic Valve: The aortic valve is normal in structure. Aortic valve regurgitation is mild. Mild to moderate aortic valve sclerosis/calcification is present, without any evidence of aortic stenosis. Pulmonic Valve: The pulmonic valve was normal in structure. Pulmonic valve regurgitation is mild. Pulmonic regurgitation is mild. Aorta: The aortic root, ascending aorta and aortic arch are all structurally normal, with no evidence of dilitation or obstruction. Venous: The inferior vena cava is normal in size with greater than 50% respiratory variability, suggesting right atrial pressure of 3 mmHg. IAS/Shunts: No atrial level shunt detected by color flow Doppler. There is no evidence of a patent foramen ovale. No ventricular septal defect is seen  or detected. There is no evidence of an atrial septal defect.  LEFT VENTRICLE PLAX 2D LVIDd:         3.70 cm LVIDs:         2.70 cm LV PW:         0.90 cm LV IVS:        0.90 cm LVOT diam:     2.00 cm LV SV:         31 ml LV SV Index:   11.22 LVOT Area:     3.14 cm  RIGHT VENTRICLE TAPSE (M-mode): 1.6 cm LEFT ATRIUM           Index       RIGHT ATRIUM           Index LA diam:      3.70 cm 1.43 cm/m  RA Area:     15.50 cm LA Vol (A2C): 28.8 ml 11.10 ml/m RA Volume:   39.60 ml  15.26 ml/m LA Vol (A4C): 65.7 ml 25.32 ml/m  AORTIC VALVE LVOT Vmax:   87.83 cm/s LVOT Vmean:  62.800 cm/s LVOT VTI:    0.139 m  AORTA Ao Root diam: 2.90 cm TRICUSPID VALVE TR Peak grad:   19.9 mmHg TR Vmax:        223.00 cm/s  SHUNTS  Systemic VTI:  0.14 m Systemic Diam: 2.00 cm  Fransico Him MD Electronically signed by Fransico Him MD Signature Date/Time: 03/01/2019/2:09:00 PM    Final     Cardiac Studies   2D echo 03/01/2019  IMPRESSIONS    1. Left ventricular ejection fraction, by visual estimation, is 60 to  65%. The left ventricle has normal function. There is no left ventricular  hypertrophy.  2. The left ventricle has no regional wall motion abnormalities.  3. Left ventricular diastolic function could not be evaluated secondary  to atrial fibrillation.  4. Global right ventricle has normal systolic function.The right  ventricular size is normal. No increase in right ventricular wall  thickness.  5. Left atrial size was normal.  6. Right atrial size was normal.  7. The mitral valve is normal in structure. No evidence of mitral valve  regurgitation. No evidence of mitral stenosis.  8. The tricuspid valve is normal in structure. Tricuspid valve  regurgitation is mild.  9. The aortic valve is normal in structure. Aortic valve regurgitation is  mild. Moderate aortic valve sclerosis/calcification without any evidence  of aortic stenosis.  10. The pulmonic valve was normal in structure. Pulmonic valve   regurgitation is mild.  11. Normal pulmonary artery systolic pressure.  12. The inferior vena cava is normal in size with greater than 50%  respiratory variability, suggesting right atrial pressure of 3 mmHg.   Patient Profile     64 y.o. female with a hx of morbid obesity, chronic diastolic CHF followed in AHF clinic, ASCAD, PAF, DM2, HTN, HLD and prior DVT on Xarelto.  She has a hx of NSTEMI in 2009 with cath showing occluded septal perforator and otherwise normal coronary arteries.  Dobutamine stress test for CP in 2013 was normal.     Seen by Dr. [name] 12//2020 and was doing well.   Her last weight in the office in Dec 2020 was 320lbs but appeared volume overloaded and diuretics were changed to Lasix 40mg  qam and 80mg  qPM. She was in NSR at last OV.  She also complained of an episode of CP at last OV and it was recommended that cath be pursued if she had any further episodes.  She has had a negative sleep study in the past.  Admitted with A fib RVR with unsuccessful DCCV in ER, + CAP.    Assessment & Plan    1 Atrial fibrillation with RVR-maintaining SR  -she has a hx of afib in the past -is on Xarelto chronically for hx of DVT -failed attempted DCCV in ER -converted yesterday to NSR  -TSH was normal at 4.66 in Dec 2020 -IV Cardizem now off after converting to NSR and soft BP -Lopressor 25mg  BID as BP tolerates - BP parameters placed -continue Xarelto -2D echo with normal LVF and normal LA size -afib likely triggered by PNA  2. CAP -started out as sinusitis last week and was on doxy -now on Levaquin per TRH -does not appear septic -COVID neg   3. HTN -BP on the soft side yesterday at 93/58mmHg likely related to overdiuresis today 107/63 to 92/63  -hold Lisinopril for now -Cardizem stopped -continue Lopressor 25mg  BID as BP allows  4. Chronic diastolic CHF -does not appear volume overloaded -net neg 1902 cc since admit  -strict I&Os and daily weights (wts are up  and down)  -Creatinine bumped to 2.09 - Diuretics being held.   5. Atypical CP -pleuritic in nature and on the right side -she  has a small pleural effusion on CT -likely due to pleurisy -not c/w ACS and EKG nonischemic.  6.  Hypothyroid with TSH of 8.129 and free T4 0.92  Per IM   7.  AKI with Cr 2.09 - IM ordered fluids, agree may need gentle hydration.  (01/15/19 with 1.46)    For questions or updates, please contact [place] Please consult [email or web address] for contact info under       Signed, Cecilie Kicks, NP  03/03/2019, 8:35 AM    History and all data above reviewed.  Patient examined.  I agree with the findings as above.  She is feeling better.  No acute stabbing chest pain that she was having. The patient exam reveals COR:RRR  ,  Lungs: Clear  ,  Abd: Positive bowel sounds, no rebound no guarding, Ext No edema  .  All available labs, radiology testing, previous records reviewed. Agree with documented assessment and plan. Atrial fib.  Therapy limited by BP but now NSR.  Continue Xarelto.  Chest pain:  Atypical.  No further cardiac work up.  Creat:  Increased.  Gentle hydration.  She is followed by Dr. Posey Pronto.   Fanta Decklyn Hyder  10:44 AM  03/03/2019

## 2019-03-03 NOTE — Telephone Encounter (Signed)
Received her positive cologuard test and called pt Pt currently admitted for a fib with RVR She had cardioversion and is back in rhythm now per her report  She hopes to go home tomorrow. Advised her of positive cologuard and went over likelihood of cancer, polyp, etc.  Referral to GI placed now.

## 2019-03-03 NOTE — Progress Notes (Signed)
    Durable Medical Equipment  (From admission, onward)         Start     Ordered   03/03/19 0902  For home use only DME Walker rolling  Once    Question Answer Comment  Walker: With [place] Wheels   Patient needs a walker to treat with the following condition Debility      03/03/19 0903        [patient weighs 146kgs.

## 2019-03-03 NOTE — Progress Notes (Addendum)
PROGRESS NOTE    Kathleen [name]  VQQ:[id number] DOB: 13-Apr-1955 DOA: 02/28/2019 PCP: Darreld Mclean, MD    Brief Narrative:  HPI per Dr. Loletha Grayer is a 64 y.o. female with medical history significant of afib, bipolar, ckd, diastolic chf, cad, on xaralto, prior vte comes in with sob.  No cp.  No n/v/d.  Feeling palpitations.  No fever or chills.  Found to be in afib with rvr.  Cardioversion attempted in ED and unsuccessful, pt on xaralto.  On dilt drip and referred for admission for rate control. Started on doxy this past week for sinusitis her covid was neg.  Assessment & Plan:   Principal Problem:   Atrial fibrillation with RVR (HCC) Active Problems:   Bipolar disorder (HCC)   HYPERTENSION, BENIGN ESSENTIAL   Morbid obesity ([place])   Chronic diastolic heart failure (HCC)   Chronic kidney disease (CKD), stage III (moderate)   CAP (community acquired pneumonia)   AKI (acute kidney injury) ([place])  1 paroxysmal A. fib with RVR Patient noted to have come in with A. fib with RVR.  Cardioversion was attempted in the ED however unsuccessful.  Patient was on Cardizem drip at maximum dose and heart rate noted to be sustaining per RN in the 130s to the 140s on 03/01/2019.  Oral metoprolol given the morning of 03/01/2019, with improvement with heart rate.  Patient subsequently converted to normal sinus rhythm the afternoon of 03/01/2019, around 2:50 PM.  Cardizem drip subsequently discontinued and patient was on oral Cardizem which has subsequently been discontinued.  Blood pressure was noted to be soft.  Patient still in normal sinus rhythm.  TSH of 8.129.  Free T4 within normal limits.  Continue Metroprolol for rate control per cardiology.  Cardizem is off.  Xarelto for anticoagulation.  Cardiology following and I appreciate their input and recommendations..   2.  Atypical chest pain Patient with atypical right-sided chest pain with pleurisy, likely secondary to probable community-acquired  pneumonia.  Patient described chest pain as burning as well as sharp in nature with no significant improvement since admission.  Patient with some chest wall tenderness to palpation on 03/01/2019.  Patient states chest pain on the right side slowly improving. Continue scheduled ibuprofen for another 24 hours then change to as needed.  Transition from IV Levaquin to oral Levaquin.  Xopenex MDI.  Supportive care.   3.  Probable community-acquired pneumonia, POA Patient with complaints of shortness of breath, right-sided chest pain, chest x-ray concerning for possible infiltrate.  CT angiogram chest negative for PE and concern for atelectasis versus possible underlying consolidation.  Patient currently afebrile.  Strep pneumococcus antigen negative.  Transition from IV Levaquin to oral Levaquin.  Continue incentive spirometry.  Xopenex MDI.  Add Mucinex.  Supportive care.   4.  Hypertension Blood pressure noted to be soft the past 2 mornings with systolics in the 43P to low 100s.  ACE inhibitor discontinued.  Patient off Cardizem.  On metoprolol which we will continue for now.  Gentle hydration with normal saline 75 cc an hour for the next 24 hours.  Follow.     5.  Morbid obesity  6.  Acute kidney injury on chronic kidney disease stage IIIb Patient with slight bump in creatinine.  Patient noted to have soft blood pressure the past 2 mornings with systolics in the 29J to low 100s.  Diuretics have been held since 03/02/2019.  ACE inhibitor has been discontinued.  Check a UA with cultures  and sensitivities.  Check a urine sodium, check a urine creatinine.  Continue to hold diuretics.  Will place on gentle hydration with normal saline at 75 cc an hour for the next 24 hours.  Monitor urine output and renal function.  Follow.    7.  History of hypothyroidism Patient stated was on medication however was discontinued per PCP as it was felt her numbers had improved.  TSH at 8.129.  Free T4 0.92.  Will likely need  repeat thyroid function studies in about 4 to 6 weeks.  Outpatient follow-up with PCP.   8.  Bipolar disorder Due to drug interaction with Cardizem Latuda was decreased to half home dose.  Cardizem discontinued.  We will increase Latuda back to home regimen of 60 mg nightly.  Continue Depakote and Zyprexa.    9.  Essential tremors Cogentin.   10.  History of gout Continue allopurinol.  11.  History of chronic diastolic CHF Seems to be compensated.  Patient seems more on the dry side today.  Blood pressure soft with systolic blood pressures in the 90s-100s early this morning.  Bump in creatinine.  Diuretics on hold yesterday and today.  ACE inhibitor discontinued.  Continue beta-blocker.  Per cardiology.  12.  Hyperlipidemia Statin.  Continue statin.   DVT prophylaxis: Xarelto Code Status: Full Family Communication: Updated patient.  No family at bedside. Disposition Plan:  . Patient came from: Home            . Anticipated d/c place: Home . Barriers to d/c OR conditions which need to be met to effect a safe d/c: Home with home health therapies, once clinically improved, rate controlled, on oral cardiac medications and when cleared by cardiology and improvement with renal function back to baseline.   Consultants:   Cardiology: Dr. Radford Pax 03/01/2019  Procedures:   CT angiogram chest 02/28/2019  Chest x-ray 02/28/2019, 03/01/2019  Antimicrobials:   IV Levaquin 02/28/2019>>>>> 03/02/2019  Oral Levaquin 03/03/2019   Subjective: Patient sitting up in bed.  States right-sided chest pain improving.  Still with a nonproductive cough.  Denies chest pain.  Denies shortness of breath.   Objective: Vitals:   03/03/19 0315 03/03/19 0316 03/03/19 0751 03/03/19 0800  BP:    107/63  Pulse:    69  Resp:    17  Temp:    97.7 F (36.5 C)  TempSrc:    Oral  SpO2: 93% 94% 96% 94%  Weight:      Height:        Intake/Output Summary (Last 24 hours) at 03/03/2019 0927 Last data filed at 03/03/2019  0900 Gross per 24 hour  Intake 113 ml  Output 1550 ml  Net -1437 ml   Filed Weights   02/28/19 1636 03/01/19 0733 03/02/19 0500  Weight: (!) 147 kg (!) 144.3 kg (!) 146 kg    Examination:  General exam: Appears calm and comfortable.  Dry mucous membranes. Respiratory system: Coarse breath sounds in the right base.  No crackles.  No wheezing.  Speaking in full sentences.  Cardiovascular system: RRR no murmurs rubs or gallops.  No JVD.  Trace bilateral lower extremity edema.     Gastrointestinal system: Abdomen is nontender, nondistended, soft, positive bowel sounds.  No rebound.  No guarding.  Central nervous system: Alert and oriented. No focal neurological deficits. Extremities: Trace bilateral lower extremity edema.  Symmetric 5 x 5 power. Skin: Chronic venous stasis changes noted on lower extremities.  Psychiatry: Judgement and insight appear normal.  Mood & affect appropriate.     Data Reviewed: I have personally reviewed following labs and imaging studies  CBC: Recent Labs  Lab 02/28/19 1739 03/01/19 0146 03/02/19 0331 03/03/19 0329  WBC 6.9 7.2 4.8 5.9  NEUTROABS 4.9 5.0 2.4 3.6  HGB 15.1* 15.2* 13.4 13.0  HCT 46.4* 46.0 40.8 39.1  MCV 90.3 90.2 92.1 91.6  PLT 117* 112* 105* 469*   Basic Metabolic Panel: Recent Labs  Lab 02/28/19 1739 03/01/19 0146 03/02/19 0331 03/03/19 0329  NA 136 139 136 132*  K 4.1 3.8 4.0 4.1  CL 98 100 99 97*  CO2 '28 27 26 24  ' GLUCOSE 153* 130* 126* 106*  BUN 35* 30* 30* 33*  CREATININE 1.48* 1.40* 1.84* 2.09*  CALCIUM 8.9 8.9 8.6* 8.3*  MG  --  2.1 2.3  --    GFR: Estimated Creatinine Clearance: 44.5 mL/min (A) (by C-G formula based on SCr of 2.09 mg/dL (H)). Liver Function Tests: Recent Labs  Lab 02/28/19 1739  AST 21  ALT 23  ALKPHOS 120  BILITOT 0.8  PROT 6.7  ALBUMIN 3.9   No results for input(s): LIPASE, AMYLASE in the last 168 hours. No results for input(s): AMMONIA in the last 168 hours. Coagulation  Profile: No results for input(s): INR, PROTIME in the last 168 hours. Cardiac Enzymes: No results for input(s): CKTOTAL, CKMB, CKMBINDEX, TROPONINI in the last 168 hours. BNP (last 3 results) No results for input(s): PROBNP in the last 8760 hours. HbA1C: Recent Labs    03/01/19 0853  HGBA1C 6.8*   CBG: Recent Labs  Lab 03/02/19 0805 03/02/19 1147 03/02/19 1600 03/02/19 2132 03/03/19 0805  GLUCAP 119* 215* 90 117* 97   Lipid Profile: No results for input(s): CHOL, HDL, LDLCALC, TRIG, CHOLHDL, LDLDIRECT in the last 72 hours. Thyroid Function Tests: Recent Labs    03/01/19 0853  TSH 8.129*  FREET4 0.92   Anemia Panel: No results for input(s): VITAMINB12, FOLATE, FERRITIN, TIBC, IRON, RETICCTPCT in the last 72 hours. Sepsis Labs: Recent Labs  Lab 02/28/19 2217 03/01/19 0146 03/01/19 0531  PROCALCITON  --  <0.10  --   LATICACIDVEN 1.4  --  1.7    Recent Results (from the past 240 hour(s))  Novel Coronavirus, NAA ([facility])     Status: None   Collection Time: 02/24/19  1:38 PM   Specimen: Nasopharyngeal(NP) swabs in vial transport medium   NASOPHARYNGE  TESTING  Result Value Ref Range Status   SARS-CoV-2, NAA Not Detected Not Detected Final    Comment: This nucleic acid amplification test was developed and its performance characteristics determined by [employer]. Nucleic acid amplification tests include RT-PCR and TMA. This test has not been FDA cleared or approved. This test has been authorized by FDA under an Emergency Use Authorization (EUA). This test is only authorized for the duration of time the declaration that circumstances exist justifying the authorization of the emergency use of in vitro diagnostic tests for detection of SARS-CoV-2 virus and/or diagnosis of COVID-19 infection under section 564(b)(1) of the Act, 21 U.S.C. 507KUV-7(D) (1), unless the authorization is terminated or revoked sooner. When diagnostic testing is negative, the  possibility of a false negative result should be considered in the context of a patient's recent exposures and the presence of clinical signs and symptoms consistent with COVID-19. An individual without symptoms of COVID-19 and who is not shedding SARS-CoV-2 virus wo uld expect to have a negative (not detected) result in this assay.   Blood culture (  routine x 2)     Status: None (Preliminary result)   Collection Time: 02/28/19 10:17 PM   Specimen: BLOOD LEFT ARM  Result Value Ref Range Status   Specimen Description   Final    BLOOD LEFT ARM Performed at [facility], [address] [place], [place] [address]    Special Requests   Final    BOTTLES DRAWN AEROBIC AND ANAEROBIC Blood Culture adequate volume Performed at [name], [address] [place], [place] [address]    Culture   Final    NO GROWTH 2 DAYS Performed at [facility], [place] [address] [place], [place] [address]    Report Status PENDING  Incomplete  SARS Coronavirus 2 by RT PCR (hospital order, performed in [facility] hospital lab) Nasopharyngeal Nasopharyngeal Swab     Status: None   Collection Time: 02/28/19 10:17 PM   Specimen: Nasopharyngeal Swab  Result Value Ref Range Status   SARS Coronavirus 2 NEGATIVE NEGATIVE Final    Comment: Performed at [facility], [address] [place], [place] [address]  Blood culture (routine x 2)     Status: None (Preliminary result)   Collection Time: 02/28/19 10:30 PM   Specimen: BLOOD RIGHT ARM  Result Value Ref Range Status   Specimen Description   Final    BLOOD RIGHT ARM Performed at [facility], [address] [place], [place] [address]    Special Requests   Final    BOTTLES DRAWN AEROBIC AND ANAEROBIC Blood Culture adequate volume Performed at [facility], [address] [place], [place] [address]    Culture   Final    NO GROWTH 2 DAYS Performed at [facility], [place] [address]  [place], [place] [address]    Report Status PENDING  Incomplete  MRSA PCR Screening     Status: None   Collection Time: 03/01/19  2:00 AM   Specimen: Nasopharyngeal  Result Value Ref Range Status   MRSA by PCR NEGATIVE NEGATIVE Final    Comment:        The GeneXpert MRSA Assay (FDA approved for NASAL specimens only), is one component of a comprehensive MRSA colonization surveillance program. It is not intended to diagnose MRSA infection nor to guide or monitor treatment for MRSA infections. Performed at [facility], [place] [name], [place], [place] [address]          Radiology Studies: ECHOCARDIOGRAM COMPLETE  Result Date: 03/01/2019   ECHOCARDIOGRAM REPORT   Patient Name:   Kathleen [name] Date of Exam: 03/01/2019 Medical Rec #:  [id number]     Height:       72.0 in Accession #:    [id number]    Weight:       318.1 lb Date of Birth:  [birth year]      BSA:          2.60 m Patient Age:    14 years      BP:           100/47 mmHg Patient Gender: F             HR:           37 bpm. Exam Location:  Inpatient Procedure: 2D Echo Indications:    Atrial Fibrillation 427.31 / I48.91  History:        Patient has prior history of Echocardiogram examinations, most  recent 08/07/2017. Previous Myocardial Infarction, COPD,                 Signs/Symptoms:Shortness of Breath; Risk Factors:Hypertension,                 Diabetes and Morbid obesity. CKD.  Sonographer:    Vikki Ports Turrentine Referring Phys: [place]  1. Left ventricular ejection fraction, by visual estimation, is 60 to 65%. The left ventricle has normal function. There is no left ventricular hypertrophy.  2. The left ventricle has no regional wall motion abnormalities.  3. Left ventricular diastolic function could not be evaluated secondary to atrial fibrillation.  4. Global right ventricle has normal systolic function.The right ventricular size is normal. No increase in right ventricular wall  thickness.  5. Left atrial size was normal.  6. Right atrial size was normal.  7. The mitral valve is normal in structure. No evidence of mitral valve regurgitation. No evidence of mitral stenosis.  8. The tricuspid valve is normal in structure. Tricuspid valve regurgitation is mild.  9. The aortic valve is normal in structure. Aortic valve regurgitation is mild. Moderate aortic valve sclerosis/calcification without any evidence of aortic stenosis. 10. The pulmonic valve was normal in structure. Pulmonic valve regurgitation is mild. 11. Normal pulmonary artery systolic pressure. 12. The inferior vena cava is normal in size with greater than 50% respiratory variability, suggesting right atrial pressure of 3 mmHg. FINDINGS  Left Ventricle: Left ventricular ejection fraction, by visual estimation, is 60 to 65%. The left ventricle has normal function. The left ventricle has no regional wall motion abnormalities. There is no left ventricular hypertrophy. The left ventricular diastology could not be evaluated due to atrial fibrillation. Left ventricular diastolic function could not be evaluated. Normal left atrial pressure. Right Ventricle: The right ventricular size is normal. No increase in right ventricular wall thickness. Global RV systolic function is has normal systolic function. The tricuspid regurgitant velocity is 2.23 m/s, and with an assumed right atrial pressure  of 3 mmHg, the estimated right ventricular systolic pressure is normal at 22.9 mmHg. Left Atrium: Left atrial size was normal in size. Right Atrium: Right atrial size was normal in size Pericardium: There is no evidence of pericardial effusion. Mitral Valve: The mitral valve is normal in structure. No evidence of mitral valve regurgitation. No evidence of mitral valve stenosis by observation. Tricuspid Valve: The tricuspid valve is normal in structure. Tricuspid valve regurgitation is mild. Aortic Valve: The aortic valve is normal in structure. Aortic  valve regurgitation is mild. Mild to moderate aortic valve sclerosis/calcification is present, without any evidence of aortic stenosis. Pulmonic Valve: The pulmonic valve was normal in structure. Pulmonic valve regurgitation is mild. Pulmonic regurgitation is mild. Aorta: The aortic root, ascending aorta and aortic arch are all structurally normal, with no evidence of dilitation or obstruction. Venous: The inferior vena cava is normal in size with greater than 50% respiratory variability, suggesting right atrial pressure of 3 mmHg. IAS/Shunts: No atrial level shunt detected by color flow Doppler. There is no evidence of a patent foramen ovale. No ventricular septal defect is seen or detected. There is no evidence of an atrial septal defect.  LEFT VENTRICLE PLAX 2D LVIDd:         3.70 cm LVIDs:         2.70 cm LV PW:         0.90 cm LV IVS:        0.90 cm LVOT diam:  2.00 cm LV SV:         31 ml LV SV Index:   11.22 LVOT Area:     3.14 cm  RIGHT VENTRICLE TAPSE (M-mode): 1.6 cm LEFT ATRIUM           Index       RIGHT ATRIUM           Index LA diam:      3.70 cm 1.43 cm/m  RA Area:     15.50 cm LA Vol (A2C): 28.8 ml 11.10 ml/m RA Volume:   39.60 ml  15.26 ml/m LA Vol (A4C): 65.7 ml 25.32 ml/m  AORTIC VALVE LVOT Vmax:   87.83 cm/s LVOT Vmean:  62.800 cm/s LVOT VTI:    0.139 m  AORTA Ao Root diam: 2.90 cm TRICUSPID VALVE TR Peak grad:   19.9 mmHg TR Vmax:        223.00 cm/s  SHUNTS Systemic VTI:  0.14 m Systemic Diam: 2.00 cm  Fransico Him MD Electronically signed by Fransico Him MD Signature Date/Time: 03/01/2019/2:09:00 PM    Final         Scheduled Meds: . allopurinol  200 mg Oral Daily  . benztropine  1 mg Oral BID  . Chlorhexidine Gluconate Cloth  6 each Topical Daily  . divalproex  1,500 mg Oral QHS  . guaiFENesin  1,200 mg Oral BID  . ibuprofen  600 mg Oral TID  . insulin aspart  0-15 Units Subcutaneous TID WC  . levalbuterol  0.63 mg Nebulization TID  . levofloxacin  250 mg Oral Daily  .  Lurasidone HCl  60 mg Oral QHS  . mouth rinse  15 mL Mouth Rinse BID  . metoprolol tartrate  25 mg Oral BID  . OLANZapine  15 mg Oral QHS  . pantoprazole  40 mg Oral Q0600  . rivaroxaban  20 mg Oral q1800  . rosuvastatin  20 mg Oral Daily  . sodium chloride flush  3 mL Intravenous Q12H   Continuous Infusions: . sodium chloride    . sodium chloride       LOS: 2 days    Time spent: 40 minutes  No charge    Irine Seal, MD [facility]   To contact the attending provider between 7A-7P or the covering provider during after hours 7P-7A, please log into the web site [email or web address] and access using universal [place] password for that web site. If you do not have the password, please call the hospital operator.  03/03/2019, 9:27 AM

## 2019-03-04 ENCOUNTER — Telehealth: Payer: Self-pay | Admitting: Family Medicine

## 2019-03-04 ENCOUNTER — Other Ambulatory Visit: Payer: Self-pay | Admitting: Medical

## 2019-03-04 LAB — CBC WITH DIFFERENTIAL/PLATELET
Abs Immature Granulocytes: 0.02 10*3/uL (ref 0.00–0.07)
Basophils Absolute: 0 10*3/uL (ref 0.0–0.1)
Basophils Relative: 0 %
Eosinophils Absolute: 0.2 10*3/uL (ref 0.0–0.5)
Eosinophils Relative: 3 %
HCT: 39.1 % (ref 36.0–46.0)
Hemoglobin: 12.9 g/dL (ref 12.0–15.0)
Immature Granulocytes: 0 %
Lymphocytes Relative: 27 %
Lymphs Abs: 1.3 10*3/uL (ref 0.7–4.0)
MCH: 30.1 pg (ref 26.0–34.0)
MCHC: 33 g/dL (ref 30.0–36.0)
MCV: 91.1 fL (ref 80.0–100.0)
Monocytes Absolute: 0.5 10*3/uL (ref 0.1–1.0)
Monocytes Relative: 11 %
Neutro Abs: 2.9 10*3/uL (ref 1.7–7.7)
Neutrophils Relative %: 59 %
Platelets: 98 10*3/uL — ABNORMAL LOW (ref 150–400)
RBC: 4.29 MIL/uL (ref 3.87–5.11)
RDW: 14 % (ref 11.5–15.5)
WBC: 5 10*3/uL (ref 4.0–10.5)
nRBC: 0 % (ref 0.0–0.2)

## 2019-03-04 LAB — URINE CULTURE: Culture: <10000 — AB

## 2019-03-04 LAB — BASIC METABOLIC PANEL
Anion gap: 9 (ref 5–15)
BUN: 30 mg/dL — ABNORMAL HIGH (ref 8–23)
CO2: 24 mmol/L (ref 22–32)
Calcium: 8.4 mg/dL — ABNORMAL LOW (ref 8.9–10.3)
Chloride: 102 mmol/L (ref 98–111)
Creatinine, Ser: 1.46 mg/dL — ABNORMAL HIGH (ref 0.44–1.00)
GFR calc Af Amer: 44 mL/min — ABNORMAL LOW (ref >60)
GFR calc non Af Amer: 38 mL/min — ABNORMAL LOW (ref >60)
Glucose, Bld: 98 mg/dL (ref 70–99)
Potassium: 4.2 mmol/L (ref 3.5–5.1)
Sodium: 135 mmol/L (ref 135–145)

## 2019-03-04 LAB — GLUCOSE, CAPILLARY
Glucose-Capillary: 96 mg/dL (ref 70–99)
Glucose-Capillary: 181 mg/dL — ABNORMAL HIGH (ref 70–99)

## 2019-03-04 MED ORDER — LEVOFLOXACIN 750 MG PO TABS: 750.0000 mg | ORAL_TABLET | Freq: Every day | ORAL | 0 refills | Status: AC

## 2019-03-04 MED ORDER — LEVOFLOXACIN 750 MG PO TABS
750.0000 mg | ORAL_TABLET | Freq: Every day | ORAL | Status: DC
  Administered 2019-03-04: 12:00:00 750 mg via ORAL
  Filled 2019-03-04: qty 1

## 2019-03-04 MED ORDER — FUROSEMIDE 40 MG PO TABS: 40.0000 mg | ORAL_TABLET | Freq: Every day | ORAL | 1 refills | Status: DC

## 2019-03-04 MED ORDER — LEVALBUTEROL TARTRATE 45 MCG/ACT IN AERO: 2.0000 | INHALATION_SPRAY | RESPIRATORY_TRACT | 1 refills | Status: DC | PRN

## 2019-03-04 MED ORDER — GUAIFENESIN ER 600 MG PO TB12: 1200.0000 mg | ORAL_TABLET | Freq: Two times a day (BID) | ORAL | 0 refills | Status: AC

## 2019-03-04 MED ORDER — LISINOPRIL 10 MG PO TABS: 10.0000 mg | ORAL_TABLET | Freq: Every day | ORAL | 1 refills | Status: DC

## 2019-03-04 MED ORDER — PANTOPRAZOLE SODIUM 40 MG PO TBEC: 40.0000 mg | DELAYED_RELEASE_TABLET | Freq: Every day | ORAL | 1 refills | Status: DC

## 2019-03-04 NOTE — TOC Progression Note (Signed)
Transition of Care [facility]) - Progression Note    Patient Details  Name: Kathleen [name] MRN: [id number] Date of Birth: [birth year]  Transition of Care [facility]) CM/SW Contact  Purcell Mouton, RN Phone Number: 03/04/2019, 2:41 PM  Clinical Narrative:    Pt discharging home with [facility] and DME from [place].    Expected Discharge Plan: [place] Barriers to Discharge: Continued Medical Work up  Expected Discharge Plan and Services Expected Discharge Plan: [place]   Discharge Planning Services: CM Consult Post Acute Care Choice: Durable Medical Equipment, [place] arrangements for the past 2 months: Single Family Home Expected Discharge Date: 03/04/19               DME Arranged: Gilford Rile rolling DME Agency: [employer] Date DME Agency Contacted: 03/03/19 Time DME Agency Contacted: 20 Representative spoke with at DME Agency: zack blank HH Arranged: PT, OT [place] Agency: [facility] (formerly [employer]) Date [place]: 03/03/19 Time [place]: [place] spoke with at [place]: [place] Determinants of Health ([place]) Interventions    Readmission Risk Interventions No flowsheet data found.

## 2019-03-04 NOTE — Progress Notes (Signed)
Progress Note  Patient Name: Kathleen [name] Date of Encounter: 03/04/2019  Primary Cardiologist: Glori Bickers, MD  AHF:  Dr. [name]  Subjective   No pain.  Breathing is improved.  Wants to go home.   Inpatient Medications    Scheduled Meds: . allopurinol  200 mg Oral Daily  . benztropine  1 mg Oral BID  . Chlorhexidine Gluconate Cloth  6 each Topical Daily  . divalproex  1,500 mg Oral QHS  . guaiFENesin  1,200 mg Oral BID  . insulin aspart  0-15 Units Subcutaneous TID WC  . levofloxacin  250 mg Oral Daily  . lurasidone  60 mg Oral QHS  . mouth rinse  15 mL Mouth Rinse BID  . metoprolol tartrate  25 mg Oral BID  . OLANZapine  15 mg Oral QHS  . pantoprazole  40 mg Oral Q0600  . rivaroxaban  20 mg Oral q1800  . rosuvastatin  20 mg Oral Daily  . sodium chloride flush  3 mL Intravenous Q12H   Continuous Infusions: . sodium chloride     PRN Meds: sodium chloride, ibuprofen, levalbuterol, sodium chloride flush   Vital Signs    Vitals:   03/03/19 1637 03/03/19 2108 03/04/19 0434 03/04/19 0500  BP: 125/71 130/71 (!) 120/57   Pulse: 72 75 76   Resp: 18 18 18    Temp: 98.1 F (36.7 C) 97.7 F (36.5 C) 97.8 F (36.6 C)   TempSrc: Oral Oral Oral   SpO2: 97% 97% 96%   Weight:    (!) 150.2 kg  Height:        Intake/Output Summary (Last 24 hours) at 03/04/2019 1056 Last data filed at 03/04/2019 0200 Gross per 24 hour  Intake 1447.6 ml  Output -  Net 1447.6 ml   Last 3 Weights 03/04/2019 03/02/2019 03/01/2019  Weight (lbs) 331 lb 2.1 oz 321 lb 14 oz 318 lb 2 oz  Weight (kg) 150.2 kg 146 kg 144.3 kg      Telemetry    NSR  - Personally Reviewed  ECG    No new - Personally Reviewed  Physical Exam   GEN: No  acute distress.   Neck: No  JVD Cardiac: RRR, no murmurs, rubs, or gallops.  Respiratory: Clear   to auscultation bilaterally. GI: Soft, nontender, non-distended, normal bowel sounds  MS:  No edema; No deformity. Neuro:   Nonfocal  Psych: Oriented and  appropriate    Labs    High Sensitivity Troponin:   Recent Labs  Lab 02/28/19 1657 02/28/19 1957  TROPONINIHS <2 2      Chemistry Recent Labs  Lab 02/28/19 1739 03/01/19 0146 03/02/19 0331 03/03/19 0329 03/04/19 0301  NA 136   < > 136 132* 135  K 4.1   < > 4.0 4.1 4.2  CL 98   < > 99 97* 102  CO2 28   < > 26 24 24   GLUCOSE 153*   < > 126* 106* 98  BUN 35*   < > 30* 33* 30*  CREATININE 1.48*   < > 1.84* 2.09* 1.46*  CALCIUM 8.9   < > 8.6* 8.3* 8.4*  PROT 6.7  --   --   --   --   ALBUMIN 3.9  --   --   --   --   AST 21  --   --   --   --   ALT 23  --   --   --   --  ALKPHOS 120  --   --   --   --   BILITOT 0.8  --   --   --   --   GFRNONAA 37*   < > 29* 25* 38*  GFRAA 43*   < > 33* 28* 44*  ANIONGAP 10   < > 11 11 9    < > = values in this interval not displayed.     Hematology Recent Labs  Lab 03/02/19 0331 03/03/19 0329 03/04/19 0301  WBC 4.8 5.9 5.0  RBC 4.43 4.27 4.29  HGB 13.4 13.0 12.9  HCT 40.8 39.1 39.1  MCV 92.1 91.6 91.1  MCH 30.2 30.4 30.1  MCHC 32.8 33.2 33.0  RDW 14.3 14.0 14.0  PLT 105* 102* 98*    BNP Recent Labs  Lab 02/28/19 1657 03/01/19 0146 03/01/19 0853  BNP 117.1* 102.3* 282.5*     DDimer No results for input(s): DDIMER in the last 168 hours.   Radiology    No results found.  Cardiac Studies   2D echo 03/01/2019  IMPRESSIONS    1. Left ventricular ejection fraction, by visual estimation, is 60 to  65%. The left ventricle has normal function. There is no left ventricular  hypertrophy.  2. The left ventricle has no regional wall motion abnormalities.  3. Left ventricular diastolic function could not be evaluated secondary  to atrial fibrillation.  4. Global right ventricle has normal systolic function.The right  ventricular size is normal. No increase in right ventricular wall  thickness.  5. Left atrial size was normal.  6. Right atrial size was normal.  7. The mitral valve is normal in structure. No  evidence of mitral valve  regurgitation. No evidence of mitral stenosis.  8. The tricuspid valve is normal in structure. Tricuspid valve  regurgitation is mild.  9. The aortic valve is normal in structure. Aortic valve regurgitation is  mild. Moderate aortic valve sclerosis/calcification without any evidence  of aortic stenosis.  10. The pulmonic valve was normal in structure. Pulmonic valve  regurgitation is mild.  11. Normal pulmonary artery systolic pressure.  12. The inferior vena cava is normal in size with greater than 50%  respiratory variability, suggesting right atrial pressure of 3 mmHg.   Patient Profile     64 y.o. female with a hx of morbid obesity, chronic diastolic CHF followed in AHF clinic, ASCAD, PAF, DM2, HTN, HLD and prior DVT on Xarelto.  She has a hx of NSTEMI in 2009 with cath showing occluded septal perforator and otherwise normal coronary arteries.  Dobutamine stress test for CP in 2013 was normal.     Seen by Dr. [name] 12//2020 and was doing well.   Her last weight in the office in Dec 2020 was 320lbs but appeared volume overloaded and diuretics were changed to Lasix 40mg  qam and 80mg  qPM. She was in NSR at last OV.  She also complained of an episode of CP at last OV and it was recommended that cath be pursued if she had any further episodes.  She has had a negative sleep study in the past.  Admitted with A fib RVR with unsuccessful DCCV in ER, + CAP.    Assessment & Plan    Atrial fibrillation with RVR-maintaining SR : Maintaining NSR. Continue current meds. No change in therapy.   CAP Treated per primary team.    HTN BP was low but rebounded with hydration yesterday.  We held lisinopril.  I think she restart at a  lower dose for discharge (10 mg daily).    Chronic diastolic CHF She was on 80 mg of Lasix daily at home.  This has been held the last couple of days.  I would resume 40 mg Lasix at discharge but I talked with her about increasing the dose  back to previous with any increased edema, SOB.  I suspect she will quickly need to resume to baseline.    Atypical CP Atypical and no objective evidence of ischemia.  No further work up.  Trop was negative   AKI:  Creat is improved.  I would suggest follow up BMET in the next week to be arranged through PCP at discharge.    For questions or updates, please contact [facility] Please consult [email or web address] for contact info under       Signed, Minus Breeding, MD  03/04/2019, 10:56 AM

## 2019-03-04 NOTE — Progress Notes (Signed)
Physical Therapy Treatment Patient Details Name: Kathleen [name] MRN: [id number] DOB: 1955-10-13 Today's Date: 03/04/2019    History of Present Illness Pt admitted with c/o SOB 2* CAP and found to be in a-fib with RVR.  Pt with hx of bipolar, DM, MI, CAD, CHF,     PT Comments    Pt ambulated 250' with RW, no loss of balance. HR 93 max while ambulating, SaO2 97% on room air. Pt required 1 standing rest break 2* 2/4 dyspnea. Pt reports the RW she was issued is pulling to the Left, case manager notified of request to exchange the RW.    Follow Up Recommendations  Home health PT     Equipment Recommendations  Rolling walker with 5" wheels ; 3 in 1   Recommendations for Other Services       Precautions / Restrictions Precautions Precautions: Fall Precaution Comments: pt reports several falls PTA Restrictions Weight Bearing Restrictions: No    Mobility  Bed Mobility Overal bed mobility: Modified Independent             General bed mobility comments: used rail, HOB up 30*  Transfers Overall transfer level: Needs assistance Equipment used: Rolling walker (2 wheeled) Transfers: Sit to/from Stand Sit to Stand: From elevated surface;Supervision         General transfer comment: cues for use of UEs to self assist (sit to stand x 2 from elevated bed and from 3 in 1)  Ambulation/Gait Ambulation/Gait assistance: Supervision Gait Distance (Feet): 250 Feet Assistive device: Rolling walker (2 wheeled) Gait Pattern/deviations: Step-through pattern;Decreased step length - right;Decreased step length - left;Drifts right/left Gait velocity: decr   General Gait Details: steady with RW, no loss of balance, HR 93 max walking, SaO2 97% on room air, 2/4 dyspnea, 1 standing rest break 2* dyspnea, pt tends to drift L and at times grazed obstacles on L with RW, she felt RW was pulling L, will request exchange of her RW that was issued   Chief Strategy Officer     Modified Rankin (Stroke Patients Only)       Balance Overall balance assessment: Needs assistance Sitting-balance support: No upper extremity supported;Feet supported Sitting balance-Leahy Scale: Good     Standing balance support: No upper extremity supported Standing balance-Leahy Scale: Fair                              Cognition Arousal/Alertness: Awake/alert Behavior During Therapy: WFL for tasks assessed/performed Overall Cognitive Status: Within Functional Limits for tasks assessed                                        Exercises      General Comments        Pertinent Vitals/Pain Pain Assessment: No/denies pain    Home Living                      Prior Function            PT Goals (current goals can now be found in the care plan section) Acute Rehab PT Goals Patient Stated Goal: Regain IND and return to previous living arrangement PT Goal Formulation: With patient Time For Goal Achievement: 03/16/19 Potential to Achieve Goals: Good Progress towards PT goals: Progressing toward  goals    Frequency    Min 3X/week      PT Plan Current plan remains appropriate    Co-evaluation              AM-PAC PT "6 Clicks" Mobility   Outcome Measure  Help needed turning from your back to your side while in a flat bed without using bedrails?: None Help needed moving from lying on your back to sitting on the side of a flat bed without using bedrails?: None Help needed moving to and from a bed to a chair (including a wheelchair)?: A Little Help needed standing up from a chair using your arms (e.g., wheelchair or bedside chair)?: A Little Help needed to walk in hospital room?: None Help needed climbing 3-5 steps with a railing? : A Little 6 Click Score: 21    End of Session Equipment Utilized During Treatment: Gait belt Activity Tolerance: Patient tolerated treatment well Patient left: in chair;with call bell/phone  within reach Nurse Communication: Mobility status PT Visit Diagnosis: Unsteadiness on feet (R26.81);Muscle weakness (generalized) (M62.81);Difficulty in walking, not elsewhere classified (R26.2)     Time: [id number] PT Time Calculation (min) (ACUTE ONLY): 24 min  Charges:  $Gait Training: 8-22 mins $Therapeutic Activity: 8-22 mins                     Blondell Reveal Kistler PT 03/04/2019  Acute Rehabilitation Services Pager [phone] Office [phone]

## 2019-03-04 NOTE — Discharge Summary (Signed)
Physician Discharge Summary  Kathleen [name] [id number] DOB: 10-27-1955 DOA: 02/28/2019  PCP: Darreld Mclean, MD  Admit date: 02/28/2019 Discharge date: 03/04/2019  Time spent: 60 minutes  Recommendations for Outpatient Follow-up:  1. Follow-up with Dr. [name], cardiology, [facility] as scheduled.  On follow-up patient's chronic diastolic CHF need to be reassessed as patient's diuretic dose was decreased in half on day of discharge per cardiology recommendations.  Patient will also need a basic metabolic profile done to follow-up on electrolytes and renal function.  Patient's A. fib will need to be followed up upon. 2. Follow-up with [name], [name], MD in 2 weeks.  On follow-up patient needS a basic metabolic profile done to follow-up on electrolytes and renal function.  Patient's hypothyroidism will also need to be followed up upon patient will likely need repeat thyroid function studies done in about 4 to 6 weeks.   Discharge Diagnoses:  Principal Problem:   Atrial fibrillation with RVR (HCC) Active Problems:   Bipolar disorder (HCC)   HYPERTENSION, BENIGN ESSENTIAL   Morbid obesity ([place])   Chronic diastolic heart failure (HCC)   Chronic kidney disease (CKD), stage III (moderate)   CAP (community acquired pneumonia)   AKI (acute kidney injury) ([place])   Discharge Condition: Stable and improved  Diet recommendation: Heart healthy  Filed Weights   03/01/19 0733 03/02/19 0500 03/04/19 0500  Weight: (!) 144.3 kg (!) 146 kg (!) 150.2 kg    History of present illness:  HPI per Dr. Loletha Grayer is a 64 y.o. female with medical history significant of afib, bipolar, ckd, diastolic chf, cad, on xaralto, prior vte comes in with sob.  No cp.  No n/v/d.  Feeling palpitations.  No fever or chills.  Found to be in afib with rvr.  Cardioversion attempted in ED and unsuccessful, pt on xaralto.  On dilt drip and referred for admission for rate control. Started on  doxy this past week for sinusitis her covid was neg.   Hospital Course:  1 paroxysmal A. fib with RVR Patient noted to have presented to the ED and admitted with A. fib with RVR.  Cardioversion was attempted in the ED however unsuccessful.  Patient was on Cardizem drip at maximum dose and heart rate noted to be sustaining per RN in the 130s to the 140s on 03/01/2019.  Oral metoprolol given the morning of 03/01/2019, with improvement with heart rate.  Patient subsequently converted to normal sinus rhythm the afternoon of 03/01/2019, around 2:50 PM.  Cardizem drip subsequently discontinued and patient was on oral Cardizem which has subsequently discontinued.  Blood pressure was noted to be soft.  Patient remained in normal sinus rhythm.  TSH of 8.129.  Free T4 within normal limits.    Patient also maintained on metoprolol for rate control per cardiology recommendations.  Cardiology followed the patient throughout the hospitalization and recommended continuation of beta-blocker for rate control as well as Xarelto for anticoagulation.  Outpatient follow-up with cardiology.  2.  Atypical chest pain Patient with atypical right-sided chest pain with pleurisy, likely secondary to community-acquired pneumonia.  Patient described chest pain as burning as well as sharp in nature with no significant improvement since admission.  Patient with some chest wall tenderness to palpation on 03/01/2019.  Patient stated chest pain on the right side slowly improving.   Patient initially placed on scheduled ibuprofen with some clinical improvement as well as antibiotic treatment.  Patient improved clinically and will be discharged  home in stable and improved condition to complete course of antibiotic treatment for pneumonia.    3.  Probable community-acquired pneumonia, POA Patient with complaints of shortness of breath, right-sided chest pain, chest x-ray concerning for possible infiltrate on admission.  CT angiogram chest negative  for PE and concern for atelectasis versus possible underlying consolidation.  Patient remained afebrile.  Strep pneumococcus antigen negative.    Patient placed on IV Levaquin and subsequently transition to oral Levaquin.  Patient was also maintained on Xopenex MDI as well as Mucinex.  Patient improved clinically during the hospitalization and discharged home on 2 more days of oral Levaquin to complete a course of antibiotic treatment.  Outpatient follow-up.   4.  Hypertension Blood pressure noted to be soft the past 2 mornings of 03/02/2019 and [id number] with systolics in the 0000000 to low 100s.  ACE inhibitor discontinued.    Cardizem subsequently was discontinued.  Patient maintained on metoprolol.  Patient hydrated with gentle hydration for 24 hours with improvement with blood pressure.  Blood pressure had stabilized by day of discharge.    5.  Morbid obesity  6.  Acute kidney injury on chronic kidney disease stage IIIb Patient with slight bump in creatinine.  Patient noted to have soft blood pressure the past 2 mornings of 03/02/2019, and 03/03/2019.  Diuretics have been held since 03/02/2019.  ACE inhibitor was subsequently discontinued.  Patient hydrated gently with IV fluids with improvement in renal function and creatinine back down to baseline by day of discharge.  Outpatient follow-up with PCP.  7.  History of hypothyroidism Patient stated was on medication however was discontinued per PCP as it was felt her numbers had improved.  TSH at 8.129.  Free T4 0.92.  Will likely need repeat thyroid function studies in about 4 to 6 weeks.  Outpatient follow-up with PCP.   8.  Bipolar disorder Due to drug interaction with Cardizem Latuda was initially decreased to half home dose.  Cardizem discontinued.    Latuda was subsequently decreased back to home regimen of 60 mg daily.  Patient also maintained on home regimen of Depakote and Zyprexa.  Outpatient follow-up.   9.  Essential tremors Remained  stable.  10.  History of gout Maintained on home regimen of allopurinol.  11.  History of chronic diastolic CHF Seems to be compensated.  Patient seemed more on the dry side during the hospitalization.  Blood pressure soft with systolic blood pressures in the 90s-100s  the morning of 03/03/2019. Bump in creatinine noted and as such diuretics were held.  Patient's ACE inhibitor was subsequently discontinued.  Patient maintained on beta-blocker.  Patient was followed by cardiology throughout the hospitalization.   On discharge cardiology recommended resumption of Lasix at 40 mg daily and patient was told by cardiology about increasing her dose back to her previous regimen if she noticed any increased edema or shortness of breath.  Outpatient follow-up with cardiology.   12.  Hyperlipidemia Patient maintained on home regimen statin during the hospitalization.   Procedures:  CT angiogram chest 02/28/2019  Chest x-ray 02/28/2019, 03/01/2019  Consultations:  Cardiology: Dr. Radford Pax 03/01/2019  Discharge Exam: Vitals:   03/04/19 0434 03/04/19 1136  BP: (!) 120/57 126/77  Pulse: 76 80  Resp: 18   Temp: 97.8 F (36.6 C) 98.3 F (36.8 C)  SpO2: 96% 97%    General: NAD Cardiovascular: RRR Respiratory: CTAB  Discharge Instructions   Discharge Instructions    Diet - low sodium heart healthy  Complete by: As directed    Increase activity slowly   Complete by: As directed      Allergies as of 03/04/2019      Reactions   Eggs Or Egg-derived Products Hives, Other (See Comments)   Can eat foods with cooked eggs; CANNOT handle when part of flu vaccine, etc.    Tetanus Toxoids    Vigorous local reaction to tdap with local pain and itching    Lamictal [lamotrigine] Hives, Rash      Medication List    STOP taking these medications   albuterol 108 (90 Base) MCG/ACT inhaler Commonly known as: VENTOLIN HFA   doxycycline 100 MG capsule Commonly known as: VIBRAMYCIN     TAKE these  medications   allopurinol 100 MG tablet Commonly known as: ZYLOPRIM TAKE TWO TABLETS BY MOUTH DAILY   benztropine 1 MG tablet Commonly known as: COGENTIN Take 1 mg by mouth 2 (two) times daily.   divalproex 500 MG 24 hr tablet Commonly known as: DEPAKOTE ER Take 1,500 mg by mouth at bedtime.   furosemide 40 MG tablet Commonly known as: Lasix Take 1 tablet (40 mg total) by mouth daily. Start taking on: March 05, 2019 What changed:   medication strength  how much to take  how to take this  when to take this  additional instructions   guaiFENesin 600 MG 12 hr tablet Commonly known as: MUCINEX Take 2 tablets (1,200 mg total) by mouth 2 (two) times daily for 2 days.   Jardiance 10 MG Tabs tablet Generic drug: empagliflozin Take 10 mg by mouth daily.   Latuda 60 MG Tabs Generic drug: Lurasidone HCl Take 60 mg by mouth at bedtime.   levalbuterol 45 MCG/ACT inhaler Commonly known as: XOPENEX HFA Inhale 2 puffs into the lungs every 4 (four) hours as needed for wheezing.   levofloxacin 750 MG tablet Commonly known as: LEVAQUIN Take 1 tablet (750 mg total) by mouth daily for 2 days. Start taking on: March 05, 2019   lisinopril 10 MG tablet Commonly known as: ZESTRIL Take 1 tablet (10 mg total) by mouth daily. What changed:   medication strength  how much to take   metFORMIN 500 MG tablet Commonly known as: GLUCOPHAGE Take 1 tablet (500 mg total) by mouth 2 (two) times daily with a meal. What changed: when to take this   metoprolol tartrate 25 MG tablet Commonly known as: LOPRESSOR Take 1 tablet (25 mg total) by mouth 2 (two) times daily.   nitroGLYCERIN 0.4 MG SL tablet Commonly known as: NITROSTAT Place 1 tablet (0.4 mg total) under the tongue every 5 (five) minutes as needed for chest pain. For chest pain   OLANZapine 15 MG tablet Commonly known as: ZYPREXA Take 15 mg by mouth at bedtime.   pantoprazole 40 MG tablet Commonly known as:  PROTONIX Take 1 tablet (40 mg total) by mouth daily at 6 (six) AM. Start taking on: March 05, 2019   rosuvastatin 20 MG tablet Commonly known as: CRESTOR Take 1 tablet (20 mg total) by mouth daily.   UNABLE TO FIND Med Name: One Touch Verio blood glucose meter Lot [id number] x Exp 01/22/2018   Xarelto 20 MG Tabs tablet Generic drug: rivaroxaban TAKE ONE TABLET BY MOUTH AT BEDTIME What changed:   how much to take  when to take this            Durable Medical Equipment  (From admission, onward)         Start  Ordered   03/04/19 1143  For home use only DME Bedside commode  Once    Comments: Heavy duty  Question:  Patient needs a bedside commode to treat with the following condition  Answer:  Fear for personal safety   03/04/19 1142   03/03/19 1410  For home use only DME Walker rolling  Once    Question Answer Comment  Walker: Other   Comments heavy duty   Patient needs a walker to treat with the following condition Debility      03/03/19 1410         Allergies  Allergen Reactions  . Eggs Or Egg-Derived Products Hives and Other (See Comments)    Can eat foods with cooked eggs; CANNOT handle when part of flu vaccine, etc.   . Tetanus Toxoids     Vigorous local reaction to tdap with local pain and itching   . Lamictal [Lamotrigine] Hives and Rash   Follow-up Information    [facility] Follow up.   Specialty: Cardiology Why: Parking code for February: [id number] Please arrive 15 minutes early for your 9am post-hospital follow-up appointment with Lyda Jester, PA-C Contact information: [address] [id number] [place] [id number] [place], [name], MD. Schedule an appointment as soon as possible for a visit in 2 week(s).   Specialty: Family Medicine Contact information: [address] [place] [address] [phone]        Bensimhon, Shaune Pascal, MD .   Specialty: Cardiology Contact information: [address] [place] [address] [phone]            The results of significant diagnostics from this hospitalization (including imaging, microbiology, ancillary and laboratory) are listed below for reference.    Significant Diagnostic Studies: DG Chest 2 View  Result Date: 02/28/2019 CLINICAL DATA:  Shortness of breath EXAM: CHEST - 2 VIEW COMPARISON:  Radiograph 12/11/2016, CT 08/05/2015 FINDINGS: Stable bandlike opacity in the left lung base compatible with scarring. Hazy opacities in the right mid to infrahilar lung are new from comparison. No focally consolidative opacity, convincing features of edema, pneumothorax, or effusion i. Cardiomediastinal contours are stable. No acute osseous or soft tissue abnormality. IMPRESSION: Some hazy opacity in the right mid to infrahilar lung could reflect atelectasis or early infection in the appropriate clinical context. Stable scarring in the left lung base. Electronically Signed   By: Lovena Le M.D.   On: 02/28/2019 17:22   CT Angio Chest PE W and/or Wo Contrast  Result Date: 02/28/2019 CLINICAL DATA:  Shortness of breath, bilateral leg swelling, history of DVT and PE EXAM: CT ANGIOGRAPHY CHEST WITH CONTRAST TECHNIQUE: Multidetector CT imaging of the chest was performed using the standard protocol during bolus administration of intravenous contrast. Multiplanar CT image reconstructions and MIPs were obtained to evaluate the vascular anatomy. CONTRAST:  33mL OMNIPAQUE IOHEXOL 350 MG/ML SOLN COMPARISON:  CT 08/05/2015 FINDINGS: Cardiovascular: There is markedly suboptimal opacification of the pulmonary arteries. No large central filling defects are seen. More distal evaluation is precluded by poor contrast bolus timing. Borderline dilation of the central pulmonary arteries is similar to prior. The aortic root is suboptimally assessed given cardiac pulsation artifact.  Atherosclerotic plaque within the normal caliber aorta. No acute luminal abnormality, periaortic stranding or hemorrhage is seen. Normal 3 vessel branching of the aortic arch. Minimal plaque in the proximal great vessels Normal heart size.  No pericardial effusion. Coronary artery calcifications are present. Mediastinum/Nodes: Calcified right hilar lymph nodes are present. No pathologically enlarged mediastinal, hilar or axillary adenopathy. No mediastinal fluid or gas. Partially calcified 1.1 cm nodule extending from the left inferior thyroid lobe, does not warrant further evaluation in a patient of this age. This follows consensus guidelines: Managing Incidental Thyroid Nodules Detected on Imaging: White Paper of the ACR Incidental Thyroid Findings Committee. J Am Coll Radiol 2015; 12:143-150. and [facility] 3-tiered system for managing ITNs: J Am Coll Radiol. 2015; Feb;12(2): 143-50. No acute abnormality of the trachea or esophagus. Lungs/Pleura: There is a small right pleural effusion. Some opacity in the right lung base likely reflects atelectasis though some underlying consolidation is not excluded. No other focal process is seen. No suspicious nodules or masses. No pneumothorax or left pleural effusion. Scattered punctate calcified granulomata are present within the right lung. Upper Abdomen: No acute abnormalities present in the visualized portions of the upper abdomen. Small exophytic fluid attenuation cyst arising from the upper pole left kidney measuring 13 mm in size (4/94). Musculoskeletal: Multilevel degenerative changes are present in the imaged portions of the spine. Multilevel flowing anterior osteophytosis, compatible with features of diffuse idiopathic skeletal hyperostosis (DISH). No acute osseous abnormality or suspicious osseous lesion. No suspicious chest wall lesions. Review of the MIP images confirms the above findings. IMPRESSION: 1. Markedly suboptimal opacification of the pulmonary arteries. No  large central filling defects are seen. More distal evaluation is precluded by poor contrast bolus timing. 2. Small right pleural effusion with right basilar atelectatic change though some underlying consolidation may be present in the appropriate clinical setting. 3. Evidence of prior granulomatous disease with multiple right calcified hilar nodes and few punctate calcified granulomata at lung parenchyma. 4. Coronary artery calcifications. 5. Aortic Atherosclerosis (ICD10-I70.0). Electronically Signed   By: Lovena Le M.D.   On: 02/28/2019 19:55   DG CHEST PORT 1 VIEW  Result Date: 03/01/2019 CLINICAL DATA:  Hypoxia. Postop day 1 RIGHT rotator cuff repair and subacromial decompression. EXAM: PORTABLE CHEST 1 VIEW COMPARISON:  02/28/2019 and earlier, including CTA chest 02/28/2019. FINDINGS: Cardiac silhouette normal in size, unchanged. Worsening bibasilar atelectasis since yesterday. No new pulmonary parenchymal abnormalities elsewhere. Normal pulmonary vascularity. Small RIGHT pleural effusion, unchanged. IMPRESSION: 1. Worsening bibasilar atelectasis since yesterday. 2. Stable small RIGHT pleural effusion. 3. No new abnormalities elsewhere. Electronically Signed   By: Evangeline Dakin M.D.   On: 03/01/2019 09:41   ECHOCARDIOGRAM COMPLETE  Result Date: 03/01/2019   ECHOCARDIOGRAM REPORT   Patient Name:   EDDA DALLAIRE Date of Exam: 03/01/2019 Medical Rec #:  [id number]     Height:       72.0 in Accession #:    [id number]    Weight:       318.1 lb Date of Birth:  1955/05/14      BSA:          2.60 m Patient Age:    7 years      BP:           100/47 mmHg Patient Gender: F             HR:           37 bpm. Exam Location:  Inpatient Procedure: 2D Echo Indications:    Atrial Fibrillation 427.31 / I48.91  History:        Patient has prior history of Echocardiogram examinations, most  recent 08/07/2017. Previous Myocardial Infarction, COPD,                 Signs/Symptoms:Shortness of Breath; Risk  Factors:Hypertension,                 Diabetes and Morbid obesity. CKD.  Sonographer:    Vikki Ports Turrentine Referring Phys: [place]  1. Left ventricular ejection fraction, by visual estimation, is 60 to 65%. The left ventricle has normal function. There is no left ventricular hypertrophy.  2. The left ventricle has no regional wall motion abnormalities.  3. Left ventricular diastolic function could not be evaluated secondary to atrial fibrillation.  4. Global right ventricle has normal systolic function.The right ventricular size is normal. No increase in right ventricular wall thickness.  5. Left atrial size was normal.  6. Right atrial size was normal.  7. The mitral valve is normal in structure. No evidence of mitral valve regurgitation. No evidence of mitral stenosis.  8. The tricuspid valve is normal in structure. Tricuspid valve regurgitation is mild.  9. The aortic valve is normal in structure. Aortic valve regurgitation is mild. Moderate aortic valve sclerosis/calcification without any evidence of aortic stenosis. 10. The pulmonic valve was normal in structure. Pulmonic valve regurgitation is mild. 11. Normal pulmonary artery systolic pressure. 12. The inferior vena cava is normal in size with greater than 50% respiratory variability, suggesting right atrial pressure of 3 mmHg. FINDINGS  Left Ventricle: Left ventricular ejection fraction, by visual estimation, is 60 to 65%. The left ventricle has normal function. The left ventricle has no regional wall motion abnormalities. There is no left ventricular hypertrophy. The left ventricular diastology could not be evaluated due to atrial fibrillation. Left ventricular diastolic function could not be evaluated. Normal left atrial pressure. Right Ventricle: The right ventricular size is normal. No increase in right ventricular wall thickness. Global RV systolic function is has normal systolic function. The tricuspid regurgitant velocity is  2.23 m/s, and with an assumed right atrial pressure  of 3 mmHg, the estimated right ventricular systolic pressure is normal at 22.9 mmHg. Left Atrium: Left atrial size was normal in size. Right Atrium: Right atrial size was normal in size Pericardium: There is no evidence of pericardial effusion. Mitral Valve: The mitral valve is normal in structure. No evidence of mitral valve regurgitation. No evidence of mitral valve stenosis by observation. Tricuspid Valve: The tricuspid valve is normal in structure. Tricuspid valve regurgitation is mild. Aortic Valve: The aortic valve is normal in structure. Aortic valve regurgitation is mild. Mild to moderate aortic valve sclerosis/calcification is present, without any evidence of aortic stenosis. Pulmonic Valve: The pulmonic valve was normal in structure. Pulmonic valve regurgitation is mild. Pulmonic regurgitation is mild. Aorta: The aortic root, ascending aorta and aortic arch are all structurally normal, with no evidence of dilitation or obstruction. Venous: The inferior vena cava is normal in size with greater than 50% respiratory variability, suggesting right atrial pressure of 3 mmHg. IAS/Shunts: No atrial level shunt detected by color flow Doppler. There is no evidence of a patent foramen ovale. No ventricular septal defect is seen or detected. There is no evidence of an atrial septal defect.  LEFT VENTRICLE PLAX 2D LVIDd:         3.70 cm LVIDs:         2.70 cm LV PW:         0.90 cm LV IVS:        0.90 cm LVOT diam:  2.00 cm LV SV:         31 ml LV SV Index:   11.22 LVOT Area:     3.14 cm  RIGHT VENTRICLE TAPSE (M-mode): 1.6 cm LEFT ATRIUM           Index       RIGHT ATRIUM           Index LA diam:      3.70 cm 1.43 cm/m  RA Area:     15.50 cm LA Vol (A2C): 28.8 ml 11.10 ml/m RA Volume:   39.60 ml  15.26 ml/m LA Vol (A4C): 65.7 ml 25.32 ml/m  AORTIC VALVE LVOT Vmax:   87.83 cm/s LVOT Vmean:  62.800 cm/s LVOT VTI:    0.139 m  AORTA Ao Root diam: 2.90 cm  TRICUSPID VALVE TR Peak grad:   19.9 mmHg TR Vmax:        223.00 cm/s  SHUNTS Systemic VTI:  0.14 m Systemic Diam: 2.00 cm  Fransico Him MD Electronically signed by Fransico Him MD Signature Date/Time: 03/01/2019/2:09:00 PM    Final     Microbiology: Recent Results (from the past 240 hour(s))  Novel Coronavirus, NAA ([facility])     Status: None   Collection Time: 02/24/19  1:38 PM   Specimen: Nasopharyngeal(NP) swabs in vial transport medium   NASOPHARYNGE  TESTING  Result Value Ref Range Status   SARS-CoV-2, NAA Not Detected Not Detected Final    Comment: This nucleic acid amplification test was developed and its performance characteristics determined by [employer]. Nucleic acid amplification tests include RT-PCR and TMA. This test has not been FDA cleared or approved. This test has been authorized by FDA under an Emergency Use Authorization (EUA). This test is only authorized for the duration of time the declaration that circumstances exist justifying the authorization of the emergency use of in vitro diagnostic tests for detection of SARS-CoV-2 virus and/or diagnosis of COVID-19 infection under section 564(b)(1) of the Act, 21 U.S.C. [id number]) (1), unless the authorization is terminated or revoked sooner. When diagnostic testing is negative, the possibility of a false negative result should be considered in the context of a patient's recent exposures and the presence of clinical signs and symptoms consistent with COVID-19. An individual without symptoms of COVID-19 and who is not shedding SARS-CoV-2 virus wo uld expect to have a negative (not detected) result in this assay.   Blood culture (routine x 2)     Status: None (Preliminary result)   Collection Time: 02/28/19 10:17 PM   Specimen: BLOOD LEFT ARM  Result Value Ref Range Status   Specimen Description   Final    BLOOD LEFT ARM Performed at [facility], [address] [place], [place] [address]     Special Requests   Final    BOTTLES DRAWN AEROBIC AND ANAEROBIC Blood Culture adequate volume Performed at [facility], [address] [place], [place] [address]    Culture   Final    NO GROWTH 4 DAYS Performed at [facility], [place] [address], [place], [place] [address]    Report Status PENDING  Incomplete  SARS Coronavirus 2 by RT PCR (hospital order, performed in [facility] hospital lab) Nasopharyngeal Nasopharyngeal Swab     Status: None   Collection Time: 02/28/19 10:17 PM   Specimen: Nasopharyngeal Swab  Result Value Ref Range Status   SARS Coronavirus 2 NEGATIVE NEGATIVE Final    Comment: Performed at [facility], [address] [place], [place] [address]  Blood culture (routine x 2)     Status: None (Preliminary result)   Collection Time: 02/28/19 10:30 PM   Specimen: BLOOD RIGHT ARM  Result Value Ref Range Status   Specimen Description   Final    BLOOD RIGHT ARM Performed at [facility], [address] [place], [place] [address]    Special Requests   Final    BOTTLES DRAWN AEROBIC AND ANAEROBIC Blood Culture adequate volume Performed at [facility], [address] [place], [place] [address]    Culture   Final    NO GROWTH 4 DAYS Performed at [facility], [place] [address], [place], [place] [address]    Report Status PENDING  Incomplete  MRSA PCR Screening     Status: None   Collection Time: 03/01/19  2:00 AM   Specimen: Nasopharyngeal  Result Value Ref Range Status   MRSA by PCR NEGATIVE NEGATIVE Final    Comment:        The GeneXpert MRSA Assay (FDA approved for NASAL specimens only), is one component of a comprehensive MRSA colonization surveillance program. It is not intended to diagnose MRSA infection nor to guide or monitor treatment for MRSA infections. Performed at [facility], [place] [address], [place], [place] [address]   Culture, Urine     Status: Abnormal   Collection  Time: 03/03/19  9:00 AM   Specimen: Urine, Clean Catch  Result Value Ref Range Status   Specimen Description   Final    URINE, CLEAN CATCH Performed at [facility], [place] [address], [place], [place] [address]    Special Requests   Final    NONE Performed at [facility], [place] [address], [place],  [address]    Culture (A)  Final    <10,000 COLONIES/mL INSIGNIFICANT GROWTH Performed at [place] [address], [place],  [address]    Report Status 03/04/2019 FINAL  Final     Labs: Basic Metabolic Panel: Recent Labs  Lab 02/28/19 1739 03/01/19 0146 03/02/19 0331 03/03/19 0329 03/04/19 0301  NA 136 139 136 132* 135  K 4.1 3.8 4.0 4.1 4.2  CL 98 100 99 97* 102  CO2 28 27 26 24 24   GLUCOSE 153* 130* 126* 106* 98  BUN 35* 30* 30* 33* 30*  CREATININE 1.48* 1.40* 1.84* 2.09* 1.46*  CALCIUM 8.9 8.9 8.6* 8.3* 8.4*  MG  --  2.1 2.3  --   --    Liver Function Tests: Recent Labs  Lab 02/28/19 1739  AST 21  ALT 23  ALKPHOS 120  BILITOT 0.8  PROT 6.7  ALBUMIN 3.9   No results for input(s): LIPASE, AMYLASE in the last 168 hours. No results for input(s): AMMONIA in the last 168 hours. CBC: Recent Labs  Lab 02/28/19 1739 03/01/19 0146 03/02/19 0331 03/03/19 0329 03/04/19 0301  WBC 6.9 7.2 4.8 5.9 5.0  NEUTROABS 4.9 5.0 2.4 3.6 2.9  HGB 15.1* 15.2* 13.4 13.0 12.9  HCT 46.4* 46.0 40.8 39.1 39.1  MCV 90.3 90.2 92.1 91.6 91.1  PLT 117* 112* 105* 102* 98*   Cardiac Enzymes: No results for input(s): CKTOTAL, CKMB, CKMBINDEX, TROPONINI in the last 168 hours. BNP: BNP (last 3 results) Recent Labs    02/28/19 1657 03/01/19 0146 03/01/19 0853  BNP 117.1* 102.3* 282.5*    ProBNP (last 3 results)  No results for input(s): PROBNP in the last 8760 hours.  CBG: Recent Labs  Lab 03/03/19 1220 03/03/19 1633 03/03/19 1936 03/04/19 0747 03/04/19 1132  GLUCAP 159* 144* 136* 96 181*        Signed:  Irine Seal MD.  [facility] 03/04/2019, 12:58 PM

## 2019-03-04 NOTE — Plan of Care (Signed)

## 2019-03-04 NOTE — Progress Notes (Signed)
PHARMACY NOTE:  ANTIMICROBIAL RENAL DOSAGE ADJUSTMENT  Current antimicrobial regimen includes a mismatch between antimicrobial dosage and estimated renal function.  As per policy approved by the Pharmacy & Therapeutics and Medical Executive Committees, the antimicrobial dosage will be adjusted accordingly.  Current antimicrobial dosage:  Levofloxacin 250 mg PO daily  Indication: PNA  Renal Function:  Estimated Creatinine Clearance: 64.7 mL/min (A) (by C-G formula based on SCr of 1.46 mg/dL (H)).  Antimicrobial dosage has been changed to:  Levofloxacin 750 mg PO daily  Lenis Noon, PharmD 03/04/19 11:21 AM

## 2019-03-05 LAB — CULTURE, BLOOD (ROUTINE X 2)
Culture: NO GROWTH
Culture: NO GROWTH
Special Requests: ADEQUATE
Special Requests: ADEQUATE

## 2019-03-06 ENCOUNTER — Encounter: Payer: Self-pay | Admitting: Family Medicine

## 2019-03-06 NOTE — Telephone Encounter (Signed)
Called pt- she was discharged from the hospital 2 days ago after a bout with A fib and CAP Cardioversion was attempted, unsuccessful. She is taking Xarelto, put on Cardizem drip for rate control-she eventually converted to sinus She was treated with Levaquin inpatient, continued p.o. Levaquin on discharge Negative for COVID-19  She notes pain in both her biceps since she got home -both sides are about equivalent Her arms are mostly just painful when she moves them or tries to lift something, at rest they're relatively comfortable No redness or swelling No numbness or pain in her hands I'm not in the office tomorrow, but likely one of my partners can see her. I asked her to look on my chart and see if she can self schedule, I will also ask my assistant to recheck to her in the morning. She feels comfortable waiting until tomorrow to be seen

## 2019-03-07 ENCOUNTER — Other Ambulatory Visit: Payer: Self-pay

## 2019-03-07 ENCOUNTER — Ambulatory Visit: Payer: BC Managed Care – PPO | Admitting: Medical

## 2019-03-07 VITALS — BP 124/66 | HR 84 | Temp 95.3°F | Resp 12 | Ht 72.0 in | Wt 329.6 lb

## 2019-03-07 DIAGNOSIS — I4891 Unspecified atrial fibrillation: Secondary | ICD-10-CM | POA: Diagnosis not present

## 2019-03-07 DIAGNOSIS — M791 Myalgia, unspecified site: Secondary | ICD-10-CM | POA: Diagnosis not present

## 2019-03-07 DIAGNOSIS — M79601 Pain in right arm: Secondary | ICD-10-CM | POA: Diagnosis not present

## 2019-03-07 DIAGNOSIS — M79602 Pain in left arm: Secondary | ICD-10-CM | POA: Diagnosis not present

## 2019-03-07 LAB — CBC WITH DIFFERENTIAL/PLATELET
Basophils Absolute: 0 10*3/uL (ref 0.0–0.1)
Basophils Relative: 0.6 % (ref 0.0–3.0)
Eosinophils Absolute: 0.1 10*3/uL (ref 0.0–0.7)
Eosinophils Relative: 1.4 % (ref 0.0–5.0)
HCT: 44.5 % (ref 36.0–46.0)
Hemoglobin: 14.7 g/dL (ref 12.0–15.0)
Lymphocytes Relative: 21.9 % (ref 12.0–46.0)
Lymphs Abs: 1.3 10*3/uL (ref 0.7–4.0)
MCHC: 32.9 g/dL (ref 30.0–36.0)
MCV: 91.2 fl (ref 78.0–100.0)
Monocytes Absolute: 0.5 10*3/uL (ref 0.1–1.0)
Monocytes Relative: 7.7 % (ref 3.0–12.0)
Neutro Abs: 4 10*3/uL (ref 1.4–7.7)
Neutrophils Relative %: 68.4 % (ref 43.0–77.0)
Platelets: 117 10*3/uL — ABNORMAL LOW (ref 150.0–400.0)
RBC: 4.89 Mil/uL (ref 3.87–5.11)
RDW: 15.3 % (ref 11.5–15.5)
WBC: 5.9 10*3/uL (ref 4.0–10.5)

## 2019-03-07 LAB — CK: Total CK: 30 U/L (ref 7–177)

## 2019-03-07 LAB — SEDIMENTATION RATE: Sed Rate: 6 mm/hr (ref 0–30)

## 2019-03-07 MED ORDER — TRAMADOL HCL 50 MG PO TABS: 50.0000 mg | ORAL_TABLET | Freq: Four times a day (QID) | ORAL | 0 refills | Status: DC | PRN

## 2019-03-07 MED ORDER — CEFDINIR 300 MG PO CAPS: 300.0000 mg | ORAL_CAPSULE | Freq: Two times a day (BID) | ORAL | 0 refills | Status: DC
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
## 2019-03-07 NOTE — Patient Instructions (Addendum)
For upper ext pain tendinitis vs myalgia will get sed rate, ck. For pain can use tylenol and add tramadol if needed. Nsaids contraindicated with xarelot.   Also prednisone can increase your sugars so will not use that presently since diabetic.  Stop levofloxin since may cause myalgia and tendinitis. Start cefdnir antibiotic. May have had pneumonia on ct but not definite. Will follow cbc,  Continue on b blocker, and xarelto for atrial fibrillation.Rate controlled. No chest pain, no cardiac  type signs and no sob. No pedal edema  Follow up in 7-10 days with pcp or as needed  Early April follow up with cardiologist.

## 2019-03-07 NOTE — Telephone Encounter (Signed)
Patient scheduled to see Percell Miller saguier today.

## 2019-03-10 ENCOUNTER — Telehealth: Payer: Self-pay | Admitting: Family Medicine

## 2019-03-10 ENCOUNTER — Encounter: Payer: Self-pay | Admitting: Family Medicine

## 2019-03-10 NOTE — Telephone Encounter (Signed)
Anderson Malta form [facility] requesting for a verbal for  PT  Once a week for 9 week starting today and a Education officer, museum referral

## 2019-03-11 NOTE — Telephone Encounter (Signed)
Verbal orders given  

## 2019-03-12 ENCOUNTER — Encounter: Payer: Self-pay | Admitting: Family Medicine

## 2019-03-12 NOTE — Telephone Encounter (Signed)
She is suppose to work tomorrow-she is off [employer]

## 2019-03-13 ENCOUNTER — Encounter (HOSPITAL_COMMUNITY): Payer: BC Managed Care – PPO

## 2019-03-13 ENCOUNTER — Encounter: Payer: Self-pay | Admitting: Family Medicine

## 2019-03-13 ENCOUNTER — Ambulatory Visit (INDEPENDENT_AMBULATORY_CARE_PROVIDER_SITE_OTHER): Payer: BC Managed Care – PPO | Admitting: Family Medicine

## 2019-03-13 DIAGNOSIS — M79602 Pain in left arm: Secondary | ICD-10-CM

## 2019-03-13 DIAGNOSIS — M79601 Pain in right arm: Secondary | ICD-10-CM | POA: Diagnosis not present

## 2019-03-13 MED ORDER — TRAMADOL HCL 50 MG PO TABS: 50.0000 mg | ORAL_TABLET | Freq: Four times a day (QID) | ORAL | 0 refills | Status: AC | PRN

## 2019-03-13 NOTE — Progress Notes (Signed)
Nellie at [facility] [address] [place], [place] [address] [id number] [phone]  Date:  03/13/2019   Name:  Kathleen [name]   DOB:  February 19, 1955   MRN:  [id number]  PCP:  Darreld Mclean, MD    Chief Complaint: No chief complaint on file.   History of Present Illness:  Kathleen [name] is a 64 y.o. very pleasant female patient who presents with the following:  Virtual visit today due to inclement weather.  Patient location is home, provider location is home.  Patient identity confirmed from 2 factors, she gives consent for virtual visit today The patient and myself are on the call today  Neoma Laming has history of MI, CAD, heart failure, A. fib on Xarelto, hypertension, chronic kidney disease, venous stasis bilaterally, bipolar disorder Last seen by myself for virtual visit on February 1.  At that time she was feeling ill, we plan to have her tested for COVID-19 and I called in doxycycline for possible sinusitis  She was later admitted from February 5-9 due to an episode of atrial fib with RVR Her diuretic dose was decreased upon discharge due to bump in creatinine  She then called [place] on February 11 with concern of bilateral upper arm pain- this seemed to start the day after she got home from the hospital She did do some PT while inpt but this was mostly walking- not arm exercises in particular She was able to see Percell Miller on February 12-her arms seemed basically normal to exam A CBC, CK and sed rate were all normal at that time  Since her last visit she continues to have the same arm pain- bilaterally, located in the upper arms It hurts the most if she has to twist her arm in some way such as to turn the faucet or handle, or to lift a drink  She has to use her arms to push herself from sit to stand-this can also be painful  She is right handed Her right arm is worse   She has used some tylenol that helps for a bit  NSAIDs are limited due  to Xarelto use Percell Miller gave her some tramadol that does help- however she is out of this  Heat seems to help more than cold  She is otherwise feeling ok She does not think she is in a fib right now- she can generally tell  No shortness of breath or chest pain  She is a patient at [facility], estimates she was seen there about 1 year ago  03/07/2019  1   03/07/2019  Tramadol Hcl 50 MG Tablet  8.00  2 Ed Sag   [id number]   Har ([id number])   0  20.00 MME  Comm Ins   [place]  04/07/2017  1   04/06/2017  Oxycodone Hcl 5 MG Tablet  30.00  5 [facility]   [id number]   Nor ([id number])   0  45.00 MME  Comm Ins     03/12/2017  1   03/12/2017  Hydrocodone-Acetamin 5-325 MG  20.00  5 [facility]   [id number]   [place] ([id number])   0  20.00 MME        Patient Active Problem List   Diagnosis Date Noted  . AKI (acute kidney injury) ([place])   . CAP (community acquired pneumonia) 03/01/2019  . Atrial fibrillation with RVR ([place]) 02/28/2019  . Chronic kidney disease (CKD), stage III (moderate) 01/14/2019  . Vitamin  D deficiency 07/22/2018  . Complete rotator cuff tear or rupture of right shoulder, not specified as traumatic 02/27/2017  . Auditory hallucination 12/13/2016  . Tremor of both hands 11/20/2016  . Gout 06/29/2016  . Type 2 diabetes mellitus ([place]) 12/31/2015  . Coronary artery dissection   . Lung nodule seen on imaging study 09/24/2014  . Paroxysmal atrial fibrillation ([place]) 09/11/2014  . Asthmatic bronchitis with exacerbation 05/25/2014  . Primary hyperparathyroidism ([place]) 04/15/2014  . Radicular low back pain 11/24/2013  . Chronic diastolic heart failure ([place]) 10/02/2013  . Shortness of breath 08/31/2013  . Venous stasis dermatitis of both lower extremities 08/19/2013  . Other malaise and fatigue 06/12/2013  . Nocturia 06/12/2013  . Cellulitis of leg, right 10/11/2012  . Morbid obesity ([place]) 08/09/2012  . Urinary incontinence, urge 07/11/2012  . CAD (coronary artery disease) 10/31/2010  . NEOPLASM OF UNCERTAIN BEHAVIOR  OF SKIN 02/11/2010  . Bipolar disorder ([place]) 09/14/2009  . ACUT MYOCARD INFARCT UNS SITE SUBSQT EPIS CARE 07/02/2008  . Hyperlipidemia 05/23/2008  . UNSPECIFIED DISORDER OF THYROID 02/04/2008  . Obesity 02/04/2008  . HYPERTENSION, BENIGN ESSENTIAL 02/04/2008  . MYOCARDIAL INFARCTION, HX OF 03/05/2007    Past Medical History:  Diagnosis Date  . Anemia   . Anxiety   . Asthma   . Atrial fibrillation ([place])   . Bipolar disorder ([place])   . Cancer ([place])    basal cell carcinoma on right hand, papilloma left breast  . CHF (congestive heart failure) ([place])    pt seen at heart/vascular spec clinic 08/14/2014   . Coarse tremors    hands  . Coronary artery disease   . Depression   . DVT (deep vein thrombosis) in pregnancy    pt. reports that it was post knee surgery, not during pregnancy  . Dyslipidemia   . Fall   . Fatty liver   . Headache    migraines - stopped after menopause  . Heart attack ([name]) 02/2007   non-q-wave with second septal perforator  . Hypertension   . Hypothyroidism    history of took medication, has resolved  . Knee pain, left   . Lower extremity deep venous thrombosis (HCC)    20 years ago   . Measles    hx of in childhood   . Morbid obesity with BMI of 40.0-44.9, adult ([place])   . Pain at surgical incision    Left breast from procedure 09/11/16  . Pneumonia    hx of   . PONV (postoperative nausea and vomiting)    also slow to wake up  . Psychiatric hospitalization 05/2008  . Shingles    20 years ago   . Shortness of breath dyspnea    exercise   . Type 2 diabetes mellitus ([place]) 12/31/2015  . Urinary incontinence     Past Surgical History:  Procedure Laterality Date  . BASAL CELL CARCINOMA EXCISION    . BREAST EXCISIONAL BIOPSY Left   . BREAST LUMPECTOMY WITH RADIOACTIVE SEED LOCALIZATION Left 09/11/2016   Procedure: LEFT BREAST LUMPECTOMY WITH RADIOACTIVE SEED LOCALIZATION;  Surgeon: Excell Seltzer, MD;  Location: [facility];  Service: General;  Laterality:  Left;  . BREAST MASS EXCISION     age 89, benign tumor  . CARDIAC CATHETERIZATION    . CARDIAC CATHETERIZATION N/A 12/30/2015   Procedure: Left Heart Cath and Coronary Angiography;  Surgeon: Belva Crome, MD;  Location: [facility];  Service: Cardiovascular;  Laterality: N/A;  . COLONOSCOPY    .  DILATATION & CURETTAGE/HYSTEROSCOPY WITH MYOSURE N/A 09/22/2016   Procedure: DILATATION & CURETTAGE/HYSTEROSCOPY WITH MYOSURE;  Surgeon: Molli Posey, MD;  Location: [facility];  Service: Gynecology;  Laterality: N/A;  . DILATION AND CURETTAGE OF UTERUS    . KNEE SURGERY     right, removed cartilage  . PARATHYROIDECTOMY N/A 08/25/2014   Procedure: PARATHYROIDECTOMY;  Surgeon: Armandina Gemma, MD;  Location: [facility];  Service: General;  Laterality: N/A;  . SHOULDER ARTHROSCOPY WITH ROTATOR CUFF REPAIR AND SUBACROMIAL DECOMPRESSION Right 02/27/2017   Procedure: Right shoulder arthroscopic rotator cuff repair with biceps tenodesis and subacromial decompression;  Surgeon: Nicholes Stairs, MD;  Location: [facility];  Service: Orthopedics;  Laterality: Right;  150 mins  . TONSILLECTOMY    . TUBAL LIGATION      Social History   Tobacco Use  . Smoking status: Never Smoker  . Smokeless tobacco: Never Used  Substance Use Topics  . Alcohol use: Yes    Alcohol/week: 0.0 standard drinks    Comment: occ.  . Drug use: No    Family History  Problem Relation Age of Onset  . Breast cancer Mother 41  . Alcohol abuse Mother   . Alzheimer's disease Mother   . Alcohol abuse Father   . Alzheimer's disease Father   . Parkinson's disease Father   . Alcohol abuse Maternal Grandfather   . Drug abuse Maternal Grandfather   . Alcohol abuse Maternal Grandmother   . Alcohol abuse Paternal Grandfather   . Alcohol abuse Paternal Grandmother   . Schizophrenia Cousin   . Diabetes Brother   . Hypertension Brother     Allergies  Allergen Reactions  . Eggs Or Egg-Derived Products Hives and Other (See Comments)     Can eat foods with cooked eggs; CANNOT handle when part of flu vaccine, etc.   . Tetanus Toxoids     Vigorous local reaction to tdap with local pain and itching   . Lamictal [Lamotrigine] Hives and Rash    Medication list has been reviewed and updated.  Current Outpatient Medications on File Prior to Visit  Medication Sig Dispense Refill  . allopurinol (ZYLOPRIM) 100 MG tablet TAKE TWO TABLETS BY MOUTH DAILY (Patient taking differently: Take 200 mg by mouth daily. ) 30 tablet 5  . benztropine (COGENTIN) 1 MG tablet Take 1 mg by mouth 2 (two) times daily.  2  . cefdinir (OMNICEF) 300 MG capsule Take 1 capsule (300 mg total) by mouth 2 (two) times daily. 20 capsule 0  . divalproex (DEPAKOTE ER) 500 MG 24 hr tablet Take 1,500 mg by mouth at bedtime.     . empagliflozin (JARDIANCE) 10 MG TABS tablet Take 10 mg by mouth daily. 90 tablet 3  . furosemide (LASIX) 40 MG tablet Take 1 tablet (40 mg total) by mouth daily. 30 tablet 1  . LATUDA 60 MG TABS Take 60 mg by mouth at bedtime.     . levalbuterol (XOPENEX HFA) 45 MCG/ACT inhaler Inhale 2 puffs into the lungs every 4 (four) hours as needed for wheezing. 1 Inhaler 1  . lisinopril (ZESTRIL) 10 MG tablet Take 1 tablet (10 mg total) by mouth daily. 30 tablet 1  . metFORMIN (GLUCOPHAGE) 500 MG tablet Take 1 tablet (500 mg total) by mouth 2 (two) times daily with a meal. (Patient taking differently: Take 500 mg by mouth every evening. ) 180 tablet 3  . metoprolol tartrate (LOPRESSOR) 25 MG tablet Take 1 tablet (25 mg total) by mouth 2 (two) times  daily. 180 tablet 3  . nitroGLYCERIN (NITROSTAT) 0.4 MG SL tablet Place 1 tablet (0.4 mg total) under the tongue every 5 (five) minutes as needed for chest pain. For chest pain 25 tablet 3  . OLANZapine (ZYPREXA) 15 MG tablet Take 15 mg by mouth at bedtime.    . pantoprazole (PROTONIX) 40 MG tablet Take 1 tablet (40 mg total) by mouth daily at 6 (six) AM. 30 tablet 1  . rosuvastatin (CRESTOR) 20 MG tablet  Take 1 tablet (20 mg total) by mouth daily. 90 tablet 3  . UNABLE TO FIND Med Name: One Touch Verio blood glucose meter Lot [id number] x Exp 01/22/2018    . XARELTO 20 MG TABS tablet TAKE ONE TABLET BY MOUTH AT BEDTIME (Patient taking differently: Take 20 mg by mouth every evening. ) 90 tablet 3   No current facility-administered medications on file prior to visit.    Review of Systems:  As per HPI- otherwise negative.   Physical Examination: There were no vitals filed for this visit. There were no vitals filed for this visit. There is no height or weight on file to calculate BMI. Ideal Body Weight:    Patient is observed over video monitor.  She looks well, no cough, wheezing, distress is noted.  Appears her normal self.  Through video communication, it appears that the area of concern is actually over the lateral epicondyle bilaterally.  I suspect that she has lateral epicondylitis  Assessment and Plan: Bilateral arm pain - Plan: traMADol (ULTRAM) 50 MG tablet  Video visit today to discuss bilateral mid arm pain She denies any swelling, tingling, numbness of her hands or arms.  I suspect she may have lateral epicondylitis, perhaps due to increased demand on her arms during recent illness.  I recommended over-the-counter tennis elbow straps, and prescribed a refill of tramadol for her to use as needed for pain.  She may also continue Tylenol and heat as needed.  I encouraged her to call the orthopedics office, it appears that they are open today.  Would like to have her seen by an orthopedist within the next few days to confirm that this diagnosis is correct.  She states understanding and agreement  Moderate medical decision making used today  Signed Lamar Blinks, MD

## 2019-03-17 ENCOUNTER — Encounter: Payer: Self-pay | Admitting: Gastroenterology

## 2019-03-19 ENCOUNTER — Inpatient Hospital Stay: Payer: BC Managed Care – PPO | Admitting: Family Medicine

## 2019-03-25 ENCOUNTER — Ambulatory Visit (HOSPITAL_COMMUNITY)
Admission: RE | Admit: 2019-03-25 | Discharge: 2019-03-25 | Disposition: A | Discharge status: Home / Self Care | Payer: BC Managed Care – PPO | Source: Ambulatory Visit | Attending: Cardiology | Admitting: Cardiology

## 2019-03-25 ENCOUNTER — Other Ambulatory Visit: Payer: Self-pay

## 2019-03-25 ENCOUNTER — Encounter (HOSPITAL_COMMUNITY): Payer: Self-pay

## 2019-03-25 VITALS — BP 130/82 | Wt 316.4 lb

## 2019-03-25 DIAGNOSIS — I110 Hypertensive heart disease with heart failure: Secondary | ICD-10-CM | POA: Diagnosis not present

## 2019-03-25 DIAGNOSIS — I480 Paroxysmal atrial fibrillation: Secondary | ICD-10-CM | POA: Diagnosis not present

## 2019-03-25 DIAGNOSIS — Z86718 Personal history of other venous thrombosis and embolism: Secondary | ICD-10-CM | POA: Diagnosis not present

## 2019-03-25 DIAGNOSIS — Z7901 Long term (current) use of anticoagulants: Secondary | ICD-10-CM | POA: Insufficient documentation

## 2019-03-25 DIAGNOSIS — K760 Fatty (change of) liver, not elsewhere classified: Secondary | ICD-10-CM | POA: Diagnosis not present

## 2019-03-25 DIAGNOSIS — Z79899 Other long term (current) drug therapy: Secondary | ICD-10-CM | POA: Insufficient documentation

## 2019-03-25 DIAGNOSIS — I2510 Atherosclerotic heart disease of native coronary artery without angina pectoris: Secondary | ICD-10-CM | POA: Insufficient documentation

## 2019-03-25 DIAGNOSIS — E119 Type 2 diabetes mellitus without complications: Secondary | ICD-10-CM | POA: Insufficient documentation

## 2019-03-25 DIAGNOSIS — J45909 Unspecified asthma, uncomplicated: Secondary | ICD-10-CM | POA: Insufficient documentation

## 2019-03-25 DIAGNOSIS — I5032 Chronic diastolic (congestive) heart failure: Secondary | ICD-10-CM | POA: Diagnosis present

## 2019-03-25 DIAGNOSIS — Z6841 Body Mass Index (BMI) 40.0 and over, adult: Secondary | ICD-10-CM | POA: Diagnosis not present

## 2019-03-25 DIAGNOSIS — E213 Hyperparathyroidism, unspecified: Secondary | ICD-10-CM | POA: Diagnosis not present

## 2019-03-25 DIAGNOSIS — F319 Bipolar disorder, unspecified: Secondary | ICD-10-CM | POA: Insufficient documentation

## 2019-03-25 DIAGNOSIS — I252 Old myocardial infarction: Secondary | ICD-10-CM | POA: Diagnosis not present

## 2019-03-25 DIAGNOSIS — E785 Hyperlipidemia, unspecified: Secondary | ICD-10-CM | POA: Diagnosis not present

## 2019-03-25 DIAGNOSIS — Z7984 Long term (current) use of oral hypoglycemic drugs: Secondary | ICD-10-CM | POA: Insufficient documentation

## 2019-03-25 LAB — BASIC METABOLIC PANEL
Anion gap: 14 (ref 5–15)
BUN: 28 mg/dL — ABNORMAL HIGH (ref 8–23)
CO2: 26 mmol/L (ref 22–32)
Calcium: 9.2 mg/dL (ref 8.9–10.3)
Chloride: 98 mmol/L (ref 98–111)
Creatinine, Ser: 1.37 mg/dL — ABNORMAL HIGH (ref 0.44–1.00)
GFR calc Af Amer: 47 mL/min — ABNORMAL LOW (ref >60)
GFR calc non Af Amer: 41 mL/min — ABNORMAL LOW (ref >60)
Glucose, Bld: 172 mg/dL — ABNORMAL HIGH (ref 70–99)
Potassium: 4.6 mmol/L (ref 3.5–5.1)
Sodium: 138 mmol/L (ref 135–145)

## 2019-03-25 LAB — BRAIN NATRIURETIC PEPTIDE: B Natriuretic Peptide: 46 pg/mL (ref 0.0–100.0)
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
## 2019-03-25 NOTE — Patient Instructions (Signed)
Lab work done today. We will notify you of any abnormal lab work. No news is good news!  Please keep virtual appointment with Dr. [name] in April.  At the [facility], you and your health needs are our priority. As part of our continuing mission to provide you with exceptional heart care, we have created designated Provider Care Teams. These Care Teams include your primary Cardiologist (physician) and Advanced Practice Providers (APPs- Physician Assistants and Nurse Practitioners) who all work together to provide you with the care you need, when you need it.   You may see any of the following providers on your designated Care Team at your next follow up: [name] Dr Glori Bickers . Dr Loralie Champagne . Darrick Grinder, NP . Lyda Jester, PA . Audry Riles, PharmD   Please be sure to bring in all your medications bottles to every appointment.

## 2019-03-28 ENCOUNTER — Encounter: Payer: Self-pay | Admitting: Gastroenterology

## 2019-03-28 ENCOUNTER — Ambulatory Visit: Payer: BC Managed Care – PPO | Admitting: Gastroenterology

## 2019-03-28 ENCOUNTER — Other Ambulatory Visit: Payer: Self-pay

## 2019-03-28 ENCOUNTER — Telehealth: Payer: Self-pay

## 2019-03-28 VITALS — BP 120/76 | HR 89 | Temp 97.1°F | Ht 72.0 in | Wt 319.5 lb

## 2019-03-28 DIAGNOSIS — Z7901 Long term (current) use of anticoagulants: Secondary | ICD-10-CM | POA: Diagnosis not present

## 2019-03-28 DIAGNOSIS — I2510 Atherosclerotic heart disease of native coronary artery without angina pectoris: Secondary | ICD-10-CM

## 2019-03-28 DIAGNOSIS — I4891 Unspecified atrial fibrillation: Secondary | ICD-10-CM

## 2019-03-28 DIAGNOSIS — Z01818 Encounter for other preprocedural examination: Secondary | ICD-10-CM | POA: Diagnosis not present

## 2019-03-28 DIAGNOSIS — R195 Other fecal abnormalities: Secondary | ICD-10-CM | POA: Diagnosis not present

## 2019-03-28 MED ORDER — CLENPIQ 10-3.5-12 MG-GM -GM/160ML PO SOLN: 1.0000 | Freq: Once | ORAL | 0 refills | Status: AC

## 2019-03-28 NOTE — Telephone Encounter (Signed)
[facility] Pre-operative Risk Assessment     Request for surgical clearance:     Endoscopy Procedure  What type of surgery is being performed?     Colonoscopy  When is this surgery scheduled?     04/10/19  What type of clearance is required ?   Pharmacy  Are there any medications that need to be held prior to surgery and how long? Xarelto  Practice name and name of physician performing surgery?      [facility]  What is your office phone and fax number?      Phone- [phone]  Fax479 [phone]  Anesthesia type (None, local, MAC, general) ?       MAC

## 2019-03-28 NOTE — Progress Notes (Signed)
Chief Complaint: Positive Cologuard test  Referring Provider:     Darreld Mclean, MD   HPI:    Kathleen [name] is a 64 y.o. female with history of CAD, MI, CHF, A. fib (on Xarelto), hypertension, CKD 3, obesity (BMI 43.3), venous stasis, bipolar disorder, referred to the Gastroenterology Clinic for evaluation of CRC screening and recent positive Cologuard on 03/03/19.   She o/w denies hematochezia, melena.   Recently admitted 2/5-9 with A. fib with RVR.  Unsuccessful cardioversion and placed on Cardizem drip, metoprolol, and converted to sinus rhythm 03/01/2019.  Transition to oral Cardizem and continued metoprolol.  Resumed Lasix after improvement of AKI on CKD for continued treatment of diastolic CHF.  Treated with antibiotics right-sided chest pain 2/2 likely CAP.  Inpatient echo with EF 60-65%, RV okay.  Was seen in the Cardiology clinic 03/25/2019.  No medication changes.  Has follow-up scheduled with Dr. [name] 04/24/19.  -Labs 03/07/2019: Normal H/H, PLT 117 (baseline~140)  Endoscopic history: -Colonoscopy (06/2009, [facility]): Reportedly normal.  No report in EMR. Request sent today for record review.   No known family history of CRC, GI malignancy, liver disease, pancreatic disease, or IBD.     Past Medical History:  Diagnosis Date  . Anemia   . Anxiety   . Asthma   . Atrial fibrillation ([place])   . Bipolar disorder ([name])   . CAD (coronary artery disease)   . Cancer ([place])    basal cell carcinoma on right hand, papilloma left breast  . CHF (congestive heart failure) ([place])    pt seen at heart/vascular spec clinic 08/14/2014   . Coarse tremors    hands  . Coronary artery disease   . Depression   . DVT (deep vein thrombosis) in pregnancy    pt. reports that it was post knee surgery, not during pregnancy  . Dyslipidemia   . Fall   . Fatty liver   . Headache    migraines - stopped after menopause  . Heart attack ([place]) 02/2007   non-q-wave with second septal  perforator  . Hypertension   . Hypothyroidism    history of took medication, has resolved  . Knee pain, left   . Lower extremity deep venous thrombosis (HCC)    20 years ago   . Measles    hx of in childhood   . Morbid obesity with BMI of 40.0-44.9, adult ([place])   . Pain at surgical incision    Left breast from procedure 09/11/16  . Pneumonia    hx of   . PONV (postoperative nausea and vomiting)    also slow to wake up  . Psychiatric hospitalization 05/2008  . Shingles    20 years ago   . Shortness of breath dyspnea    exercise   . Type 2 diabetes mellitus ([place]) 12/31/2015  . Urinary incontinence   . UTI (urinary tract infection)      Past Surgical History:  Procedure Laterality Date  . BASAL CELL CARCINOMA EXCISION    . BREAST EXCISIONAL BIOPSY Left   . BREAST LUMPECTOMY WITH RADIOACTIVE SEED LOCALIZATION Left 09/11/2016   Procedure: LEFT BREAST LUMPECTOMY WITH RADIOACTIVE SEED LOCALIZATION;  Surgeon: Excell Seltzer, MD;  Location: [facility];  Service: General;  Laterality: Left;  . BREAST MASS EXCISION     age 2, benign tumor  . CARDIAC CATHETERIZATION    . CARDIAC CATHETERIZATION N/A 12/30/2015   Procedure:  Left Heart Cath and Coronary Angiography;  Surgeon: Belva Crome, MD;  Location: [facility];  Service: Cardiovascular;  Laterality: N/A;  . COLONOSCOPY  07/15/2009   [facility]  . DILATATION & CURETTAGE/HYSTEROSCOPY WITH MYOSURE N/A 09/22/2016   Procedure: DILATATION & CURETTAGE/HYSTEROSCOPY WITH MYOSURE;  Surgeon: Molli Posey, MD;  Location: [facility];  Service: Gynecology;  Laterality: N/A;  . DILATION AND CURETTAGE OF UTERUS    . KNEE SURGERY     right, removed cartilage  . PARATHYROIDECTOMY N/A 08/25/2014   Procedure: PARATHYROIDECTOMY;  Surgeon: Armandina Gemma, MD;  Location: [facility];  Service: General;  Laterality: N/A;  . SHOULDER ARTHROSCOPY WITH ROTATOR CUFF REPAIR AND SUBACROMIAL DECOMPRESSION Right 02/27/2017   Procedure: Right shoulder arthroscopic rotator  cuff repair with biceps tenodesis and subacromial decompression;  Surgeon: Nicholes Stairs, MD;  Location: [facility];  Service: Orthopedics;  Laterality: Right;  150 mins  . TONSILLECTOMY    . TUBAL LIGATION     Family History  Problem Relation Age of Onset  . Breast cancer Mother 83  . Alcohol abuse Mother   . Alzheimer's disease Mother   . Lung cancer Mother   . Alcohol abuse Father   . Alzheimer's disease Father   . Parkinson's disease Father   . Alcohol abuse Maternal Grandfather   . Drug abuse Maternal Grandfather   . Alcohol abuse Maternal Grandmother   . Alcohol abuse Paternal Grandfather   . Alcohol abuse Paternal Grandmother   . Schizophrenia Cousin   . Diabetes Brother   . Hypertension Brother   . Colon cancer Neg Hx   . Esophageal cancer Neg Hx    Social History   Tobacco Use  . Smoking status: Never Smoker  . Smokeless tobacco: Never Used  Substance Use Topics  . Alcohol use: Yes    Alcohol/week: 0.0 standard drinks    Comment: occ.  . Drug use: No   Current Outpatient Medications  Medication Sig Dispense Refill  . allopurinol (ZYLOPRIM) 100 MG tablet TAKE TWO TABLETS BY MOUTH DAILY 30 tablet 5  . benztropine (COGENTIN) 1 MG tablet Take 1 mg by mouth 2 (two) times daily.  2  . divalproex (DEPAKOTE ER) 500 MG 24 hr tablet Take 1,500 mg by mouth at bedtime.     . empagliflozin (JARDIANCE) 10 MG TABS tablet Take 10 mg by mouth daily. 90 tablet 3  . furosemide (LASIX) 80 MG tablet Take 120 mg by mouth daily. Take 80mg  in the morning and 40mg  in the afternoon    . LATUDA 60 MG TABS Take 60 mg by mouth at bedtime.     . levalbuterol (XOPENEX HFA) 45 MCG/ACT inhaler Inhale 2 puffs into the lungs every 4 (four) hours as needed for wheezing. 1 Inhaler 1  . lisinopril (ZESTRIL) 20 MG tablet Take 20 mg by mouth daily.    . metFORMIN (GLUCOPHAGE) 500 MG tablet Take 500 mg by mouth every evening.    . metoprolol tartrate (LOPRESSOR) 25 MG tablet Take 1 tablet (25 mg  total) by mouth 2 (two) times daily. 180 tablet 3  . nitroGLYCERIN (NITROSTAT) 0.4 MG SL tablet Place 1 tablet (0.4 mg total) under the tongue every 5 (five) minutes as needed for chest pain. For chest pain 25 tablet 3  . OLANZapine (ZYPREXA) 15 MG tablet Take 15 mg by mouth at bedtime.    . rosuvastatin (CRESTOR) 20 MG tablet Take 1 tablet (20 mg total) by mouth daily. 90 tablet 3  .  UNABLE TO FIND Med Name: One Touch Verio blood glucose meter Lot [id number] x Exp 01/22/2018    . XARELTO 20 MG TABS tablet TAKE ONE TABLET BY MOUTH AT BEDTIME 90 tablet 3   No current facility-administered medications for this visit.   Allergies  Allergen Reactions  . Eggs Or Egg-Derived Products Hives and Other (See Comments)    Can eat foods with cooked eggs; CANNOT handle when part of flu vaccine, etc.   . Tetanus Toxoids     Vigorous local reaction to tdap with local pain and itching   . Lamictal [Lamotrigine] Hives and Rash     Review of Systems: All systems reviewed and negative except where noted in HPI.     Physical Exam:    Wt Readings from Last 3 Encounters:  03/28/19 (!) 319 lb 8 oz (144.9 kg)  03/25/19 (!) 316 lb 6 oz (143.5 kg)  03/07/19 (!) 329 lb 9.6 oz (149.5 kg)    BP 120/76   Pulse 89   Temp (!) 97.1 F (36.2 C)   Ht 6' (1.829 m)   Wt (!) 319 lb 8 oz (144.9 kg)   BMI 43.33 kg/m  Constitutional:  Pleasant, in no acute distress. Psychiatric: Normal mood and affect. Behavior is normal. EENT: Pupils normal.  Conjunctivae are normal. No scleral icterus. Neck supple. No cervical LAD. Cardiovascular: Normal rate, regular rhythm. No edema Pulmonary/chest: Effort normal and breath sounds normal. No wheezing, rales or rhonchi. Abdominal: Soft, nondistended, nontender. Bowel sounds active throughout. There are no masses palpable. No hepatomegaly. Neurological: Alert and oriented to person place and time. Skin: Skin is warm and dry. No rashes noted.   ASSESSMENT AND PLAN;   1)  Positive Cologuard test -Schedule colonoscopy  2) Systemic anticoagulation 3) History of atrial fibrillation 4) CHF 5) History of CAD -We will send request for Cardiology clearance to proceed with colonoscopy -Hold Xarelto to days before procedure - will instruct when and how to resume after procedure. Low but real risk of cardiovascular event such as heart attack, stroke, embolism, thrombosis or ischemia/infarct of other organs off Xarelto explained and need to seek urgent help if this occurs. The patient consents to proceed. Will communicate by phone or EMR with patient's prescribing provider to confirm that holding Xarelto is reasonable in this case  The indications, risks, and benefits of colonoscopy were explained to the patient in detail. Risks include but are not limited to bleeding, perforation, adverse reaction to medications, and cardiopulmonary compromise. Sequelae include but are not limited to the possibility of surgery, hospitalization, and mortality.  Discussed elevated periprocedural risks related to underlying comorbidities.  The patient verbalized understanding and wished to proceed. All questions answered, referred to the scheduler and bowel prep ordered. Further recommendations pending results of the exam.    Lavena Bullion, DO, FACG  03/28/2019, 10:29 AM   [name], [name], MD

## 2019-03-28 NOTE — Telephone Encounter (Signed)
Patient with diagnosis of atrial fibrillation on Xarelto for anticoagulation.    Procedure: Endoscopy Date of procedure: 04/10/19  CHADS2-VASc score of 5 (CHF, HTN, DM2, CAD, female)  CrCl: 96.1 ml/min using actual body weight (144.9 kg), 67.5 ml/min using adjusted body weight (101.8 kg) Platelet count: 117  Per office protocol, patient can hold Xarelto for 1-2 day prior to procedure.

## 2019-03-28 NOTE — Telephone Encounter (Signed)
Spoke to patient to inform her that she can hold her Xarelto 2 days prior to colonoscopy. All questions answered. Patient voiced understanding.

## 2019-03-28 NOTE — Patient Instructions (Signed)
It has been recommended to you by your physician that you have a(n) colonoscopy completed. Per your request, we did not schedule the procedure(s) today. Please contact our office at [phone] should you decide to have the procedure completed. You will be scheduled for a pre-visit and procedure at that time.   It was a pleasure to see you today!  Vito Cirigliano, D.O.  

## 2019-03-28 NOTE — Telephone Encounter (Signed)
   Primary Cardiologist: Glori Bickers, MD  Chart reviewed as part of pre-operative protocol coverage.   Per pharmacy recommendations, patient can hold xarelto 1-2 days prior to her upcoming colonoscopy. She should restart xarelto as soon as she is cleared to do so by her gastroenterologist.   I will route this recommendation to the requesting party via [facility] fax function and remove from pre-op pool.  Please call with questions.  Abigail Butts, PA-C 03/28/2019, 1:54 PM

## 2019-04-08 ENCOUNTER — Other Ambulatory Visit: Payer: Self-pay | Admitting: Gastroenterology

## 2019-04-08 ENCOUNTER — Other Ambulatory Visit: Payer: Self-pay | Admitting: Obstetrics and Gynecology

## 2019-04-08 ENCOUNTER — Ambulatory Visit (INDEPENDENT_AMBULATORY_CARE_PROVIDER_SITE_OTHER): Payer: BC Managed Care – PPO

## 2019-04-08 DIAGNOSIS — Z1159 Encounter for screening for other viral diseases: Secondary | ICD-10-CM

## 2019-04-08 DIAGNOSIS — Z9189 Other specified personal risk factors, not elsewhere classified: Secondary | ICD-10-CM

## 2019-04-08 LAB — SARS CORONAVIRUS 2 (TAT 6-24 HRS): SARS Coronavirus 2: NEGATIVE

## 2019-04-10 ENCOUNTER — Encounter: Payer: Self-pay | Admitting: Gastroenterology

## 2019-04-10 ENCOUNTER — Ambulatory Visit (AMBULATORY_SURGERY_CENTER): Payer: BC Managed Care – PPO | Admitting: Gastroenterology

## 2019-04-10 ENCOUNTER — Other Ambulatory Visit: Payer: Self-pay

## 2019-04-10 VITALS — BP 130/70 | HR 83 | Temp 97.1°F | Resp 15 | Ht 72.0 in | Wt 319.0 lb

## 2019-04-10 DIAGNOSIS — D122 Benign neoplasm of ascending colon: Secondary | ICD-10-CM

## 2019-04-10 DIAGNOSIS — R195 Other fecal abnormalities: Secondary | ICD-10-CM | POA: Diagnosis not present

## 2019-04-10 DIAGNOSIS — D124 Benign neoplasm of descending colon: Secondary | ICD-10-CM

## 2019-04-10 DIAGNOSIS — K621 Rectal polyp: Secondary | ICD-10-CM

## 2019-04-10 DIAGNOSIS — D128 Benign neoplasm of rectum: Secondary | ICD-10-CM

## 2019-04-10 DIAGNOSIS — K5730 Diverticulosis of large intestine without perforation or abscess without bleeding: Secondary | ICD-10-CM

## 2019-04-10 MED ORDER — SODIUM CHLORIDE 0.9 % IV SOLN: 500.0000 mL | Freq: Once | INTRAVENOUS | Status: DC

## 2019-04-10 NOTE — Op Note (Signed)
[facility] Patient Name: Kathleen [name] Procedure Date: 04/10/2019 8:01 AM MRN: [id number] Endoscopist: Gerrit Heck , MD Age: 64 Referring MD:  Date of Birth: [birth year] Gender: Female Account #: [id number] Procedure:                Colonoscopy Indications:              Positive Cologuard test 03/03/2019                           Last colonoscopy was at outside facilility in 2011                            and reportedly normal/no polyps. Medicines:                Monitored Anesthesia Care Procedure:                Pre-Anesthesia Assessment:                           - Prior to the procedure, a History and Physical                            was performed, and patient medications and                            allergies were reviewed. The patient's tolerance of                            previous anesthesia was also reviewed. The risks                            and benefits of the procedure and the sedation                            options and risks were discussed with the patient.                            All questions were answered, and informed consent                            was obtained. Prior Anticoagulants: The patient has                            taken Xarelto (rivaroxaban), last dose was 2 days                            prior to procedure. ASA Grade Assessment: III - A                            patient with severe systemic disease. After                            reviewing the risks and benefits, the patient was  deemed in satisfactory condition to undergo the                            procedure.                           After obtaining informed consent, the colonoscope                            was passed under direct vision. Throughout the                            procedure, the patient's blood pressure, pulse, and                            oxygen saturations were monitored continuously. The   Colonoscope was introduced through the anus and                            advanced to the the cecum, identified by                            appendiceal orifice and ileocecal valve. The                            colonoscopy was performed without difficulty. The                            patient tolerated the procedure well. The quality                            of the bowel preparation was excellent. The                            ileocecal valve, appendiceal orifice, and rectum                            were photographed. Scope In: 8:08:06 AM Scope Out: 8:30:05 AM Scope Withdrawal Time: 0 hours 17 minutes 33 seconds  Total Procedure Duration: 0 hours 21 minutes 59 seconds  Findings:                 The perianal and digital rectal examinations were                            normal.                           Four sessile polyps were found in the descending                            colon (3) and ascending colon (1). The polyps were                            3 to 6 mm in size. These polyps were removed with a  cold snare. Resection and retrieval were complete.                            Estimated blood loss was minimal.                           Two sessile polyps were found in the rectum. The                            polyps were 1 to 2 mm in size. These polyps were                            removed with a cold biopsy forceps. Resection and                            retrieval were complete. Estimated blood loss was                            minimal.                           Multiple small and large-mouthed diverticula were                            found in the sigmoid colon, ascending colon and                            cecum.                           The retroflexed view of the distal rectum and anal                            verge was normal and showed no anal or rectal                            abnormalities. Complications:            No  immediate complications. Estimated Blood Loss:     Estimated blood loss was minimal. Impression:               - Four 3 to 6 mm polyps in the descending colon and                            in the ascending colon, removed with a cold snare.                            Resected and retrieved.                           - Two 1 to 2 mm polyps in the rectum, removed with                            a cold biopsy forceps. Resected and retrieved.                           -  Diverticulosis in the sigmoid colon, in the                            ascending colon and in the cecum.                           - The distal rectum and anal verge are normal on                            retroflexion view. Recommendation:           - Patient has a contact number available for                            emergencies. The signs and symptoms of potential                            delayed complications were discussed with the                            patient. Return to normal activities tomorrow.                            Written discharge instructions were provided to the                            patient.                           - Resume previous diet.                           - Continue present medications.                           - Await pathology results.                           - Repeat colonoscopy in 3 - 5 years for                            surveillance based on pathology results.                           - Return to GI office PRN. Gerrit Heck, MD 04/10/2019 8:36:46 AM

## 2019-04-10 NOTE — Progress Notes (Signed)
Called to room to assist during endoscopic procedure.  Patient ID and intended procedure confirmed with present staff. Received instructions for my participation in the procedure from the performing physician.  

## 2019-04-10 NOTE — Patient Instructions (Signed)
Discharge instructions given. Handouts on polyps and Diverticulosis. Resume Xarelto tomorrow. Resume other medications today. YOU HAD AN ENDOSCOPIC PROCEDURE TODAY AT [facility]:   Refer to the procedure report that was given to you for any specific questions about what was found during the examination.  If the procedure report does not answer your questions, please call your gastroenterologist to clarify.  If you requested that your care partner not be given the details of your procedure findings, then the procedure report has been included in a sealed envelope for you to review at your convenience later.  YOU SHOULD EXPECT: Some feelings of bloating in the abdomen. Passage of more gas than usual.  Walking can help get rid of the air that was put into your GI tract during the procedure and reduce the bloating. If you had a lower endoscopy (such as a colonoscopy or flexible sigmoidoscopy) you may notice spotting of blood in your stool or on the toilet paper. If you underwent a bowel prep for your procedure, you may not have a normal bowel movement for a few days.  Please Note:  You might notice some irritation and congestion in your nose or some drainage.  This is from the oxygen used during your procedure.  There is no need for concern and it should clear up in a day or so.  SYMPTOMS TO REPORT IMMEDIATELY:   Following lower endoscopy (colonoscopy or flexible sigmoidoscopy):  Excessive amounts of blood in the stool  Significant tenderness or worsening of abdominal pains  Swelling of the abdomen that is new, acute  Fever of 100F or higher   For urgent or emergent issues, a gastroenterologist can be reached at any hour by calling [phone]. Do not use MyChart messaging for urgent concerns.    DIET:  We do recommend a small meal at first, but then you may proceed to your regular diet.  Drink plenty of fluids but you should avoid alcoholic beverages for 24  hours.  ACTIVITY:  You should plan to take it easy for the rest of today and you should NOT DRIVE or use heavy machinery until tomorrow (because of the sedation medicines used during the test).    FOLLOW UP: Our staff will call the number listed on your records 48-72 hours following your procedure to check on you and address any questions or concerns that you may have regarding the information given to you following your procedure. If we do not reach you, we will leave a message.  We will attempt to reach you two times.  During this call, we will ask if you have developed any symptoms of COVID 19. If you develop any symptoms (ie: fever, flu-like symptoms, shortness of breath, cough etc.) before then, please call [phone].  If you test positive for Covid 19 in the 2 weeks post procedure, please call and report this information to [place].    If any biopsies were taken you will be contacted by phone or by letter within the next 1-3 weeks.  Please call us at [phone] if you have not heard about the biopsies in 3 weeks.    SIGNATURES/CONFIDENTIALITY: You and/or your care partner have signed paperwork which will be entered into your electronic medical record.  These signatures attest to the fact that that the information above on your After Visit Summary has been reviewed and is understood.  Full responsibility of the confidentiality of this discharge information lies with you and/or your care-partner.

## 2019-04-10 NOTE — Progress Notes (Signed)
A/ox3, pleased with MAC, report to RN 

## 2019-04-10 NOTE — Progress Notes (Signed)
Temp taken by JB VS taken by DT 

## 2019-04-14 ENCOUNTER — Telehealth: Payer: Self-pay | Admitting: *Deleted

## 2019-04-14 ENCOUNTER — Telehealth: Payer: Self-pay

## 2019-04-14 NOTE — Telephone Encounter (Signed)
No answer at # given.  Lm on VM

## 2019-04-14 NOTE — Telephone Encounter (Signed)
  Follow up Call-  Call back number 04/10/2019  Post procedure Call Back phone  # [phone]  Permission to leave phone message Yes  Some recent data might be hidden     Patient questions:  Do you have a fever, pain , or abdominal swelling? No. Pain Score  0 *  Have you tolerated food without any problems? Yes.    Have you been able to return to your normal activities? Yes.    Do you have any questions about your discharge instructions: Diet   No. Medications  No. Follow up visit  No.  Do you have questions or concerns about your Care? No.  Actions: * If pain score is 4 or above: No action needed, pain <4.  1. Have you developed a fever since your procedure? no  2.   Have you had an respiratory symptoms (SOB or cough) since your procedure? no  3.   Have you tested positive for COVID 19 since your procedure no  4.   Have you had any family members/close contacts diagnosed with the COVID 19 since your procedure?  no   If yes to any of these questions please route to Joylene John, RN and Alphonsa Gin, Therapist, sports.

## 2019-04-16 ENCOUNTER — Inpatient Hospital Stay: Payer: BC Managed Care – PPO | Admitting: Family Medicine

## 2019-04-18 ENCOUNTER — Encounter: Payer: Self-pay | Admitting: Gastroenterology

## 2019-04-23 ENCOUNTER — Telehealth: Payer: Self-pay | Admitting: Family Medicine

## 2019-04-23 NOTE — Telephone Encounter (Signed)
FYI Fall Report. Reports that patient fell on Saturday at [facility]. Pt fell out of the booth.. No broken bones or Injuries were found. Right knee was stiffness may have been what caused the fall.   Reported by Criss Rosales with  [facility]. No call back needed

## 2019-04-24 ENCOUNTER — Other Ambulatory Visit: Payer: Self-pay

## 2019-04-24 ENCOUNTER — Ambulatory Visit (HOSPITAL_COMMUNITY)
Admission: RE | Admit: 2019-04-24 | Discharge: 2019-04-24 | Disposition: A | Discharge status: Home / Self Care | Payer: BC Managed Care – PPO | Source: Ambulatory Visit | Attending: Internal Medicine | Admitting: Internal Medicine

## 2019-04-24 ENCOUNTER — Encounter (HOSPITAL_COMMUNITY): Payer: Self-pay | Admitting: *Deleted

## 2019-04-24 DIAGNOSIS — E118 Type 2 diabetes mellitus with unspecified complications: Secondary | ICD-10-CM

## 2019-04-24 DIAGNOSIS — I2510 Atherosclerotic heart disease of native coronary artery without angina pectoris: Secondary | ICD-10-CM | POA: Diagnosis not present

## 2019-04-24 DIAGNOSIS — I480 Paroxysmal atrial fibrillation: Secondary | ICD-10-CM | POA: Diagnosis not present

## 2019-04-24 DIAGNOSIS — I10 Essential (primary) hypertension: Secondary | ICD-10-CM

## 2019-04-24 DIAGNOSIS — I5032 Chronic diastolic (congestive) heart failure: Secondary | ICD-10-CM | POA: Diagnosis not present

## 2019-04-24 NOTE — Progress Notes (Signed)
Heart Failure TeleHealth Note  Due to national recommendations of social distancing due to [place] 19, Audio/video telehealth visit is felt to be most appropriate for this patient at this time.  See MyChart message from today for patient consent regarding telehealth for [facility]. The patient was identified personally using two identifiers.   Date:  04/24/2019   ID:  Kathleen [name], DOB 10-29-1955, MRN [id number]  Location: Home  Provider location: [name] Type of Visit: Established patient  PCP:  [name], [name], MD  Cardiologist:  Glori Bickers, MD Primary HF: Latasha Puskas  Chief Complaint: Heart Failure follow-up   History of Present Illness:  [name] is a 64y/o woman (mother of [name] -ICU nurse) with a h/o morbid obesity, chronic diastolic HF, DM2, HTN, HL and CAD. Sleep study 11/2013 was negative. Also has history of A fib.   Had NSTEMI in 2/09 had cath at that time with occlusion of septal perforator. Otherwise normal arteries. Was admitted for CP in 11/13. CE normal. Underwent dobutamine stress echo which was normal.   Admitted 09/11/14 for Afib RVR. She converted to sinus tach with dilt drip and was transitioned to Toprol-XL 25 mg daily. She was in NSR at the time of discharge. Discharge weight was 325 lb.  Admitted 7/12-7/14/17 with chest pain. CTA negative for PE. Troponins negative. Had Myoview 08/05/15 with "subtle" anterior wall defect.Felt tobe breast attenuation. Echo stable as below.   Holter monitor in 7/19: NSR with occasional PVCs and PACs. No other abnormalities. Multiple diary events which occasionally correspond to PVCs but often during NSR.  She was recently admitted 2/21 for atrial fibrillation w/ RVR in the setting of CAP and also w/ a/c diastolic HF, requiring IV diuretics. She was admitted by [facility] and general cardiology consulted. Echo repeated and EF 60-65%. RV ok. PNA treated w/ abx. AFib w/ Cardizem  and metoprolol w/ spontaneous conversion back to NSR. She diuresed well w/ IV Lasix and transitioned back to PO Lasix. Hospital course also notable for AKI. SCr rose to 2.09 during admit but improved down to 1.46 on d/c. D/c wt 330 lb.  She presents today for telehealth visit in setting of COVID pandemic. She was seen in clinic in early March and weight 315. Continues to take lasix 80/40. Weight down to 311 but feels she still has fluid on board. 1+ LE edema. Wearing stockings occasionally. Chronic NYHA III exertional dyspnea. No orthopnea or PND. Has home PT. Yesterday BP 140/62.  Kathleen [name] denies symptoms worrisome for COVID 19.   Past Medical History:  Diagnosis Date  . Anemia   . Anxiety   . Asthma   . Atrial fibrillation ([place])   . Bipolar disorder ([place])   . CAD (coronary artery disease)   . Cancer ([place])    basal cell carcinoma on right hand, papilloma left breast  . CHF (congestive heart failure) ([place])    pt seen at heart/vascular spec clinic 08/14/2014   . Chronic kidney disease   . Coarse tremors    hands  . Coronary artery disease   . Depression   . DVT (deep vein thrombosis) in pregnancy    pt. reports that it was post knee surgery, not during pregnancy  . Dyslipidemia   . Fall   . Fatty liver   . Headache    migraines - stopped after menopause  . Heart attack ([place]) 02/2007   non-q-wave with second septal perforator  .  Hyperlipidemia   . Hypertension   . Hypothyroidism    history of took medication, has resolved  . Knee pain, left   . Lower extremity deep venous thrombosis (HCC)    20 years ago   . Measles    hx of in childhood   . Morbid obesity with BMI of 40.0-44.9, adult ([place])   . Pain at surgical incision    Left breast from procedure 09/11/16  . Pneumonia    hx of   . PONV (postoperative nausea and vomiting)    also slow to wake up  . Psychiatric hospitalization 05/2008  . Shingles    20 years ago   . Shortness of breath dyspnea    exercise   .  Type 2 diabetes mellitus ([place]) 12/31/2015  . Urinary incontinence   . UTI (urinary tract infection)    Past Surgical History:  Procedure Laterality Date  . BASAL CELL CARCINOMA EXCISION    . BREAST EXCISIONAL BIOPSY Left   . BREAST LUMPECTOMY WITH RADIOACTIVE SEED LOCALIZATION Left 09/11/2016   Procedure: LEFT BREAST LUMPECTOMY WITH RADIOACTIVE SEED LOCALIZATION;  Surgeon: Excell Seltzer, MD;  Location: [place] [facility];  Service: General;  Laterality: Left;  . BREAST MASS EXCISION     age 62, benign tumor  . CARDIAC CATHETERIZATION    . CARDIAC CATHETERIZATION N/A 12/30/2015   Procedure: Left Heart Cath and Coronary Angiography;  Surgeon: Belva Crome, MD;  Location: [place] [facility];  Service: Cardiovascular;  Laterality: N/A;  . COLONOSCOPY  07/15/2009   [facility]  . DILATATION & CURETTAGE/HYSTEROSCOPY WITH MYOSURE N/A 09/22/2016   Procedure: DILATATION & CURETTAGE/HYSTEROSCOPY WITH MYOSURE;  Surgeon: Molli Posey, MD;  Location: [facility];  Service: Gynecology;  Laterality: N/A;  . DILATION AND CURETTAGE OF UTERUS    . KNEE SURGERY     right, removed cartilage  . PARATHYROIDECTOMY N/A 08/25/2014   Procedure: PARATHYROIDECTOMY;  Surgeon: Armandina Gemma, MD;  Location: [facility];  Service: General;  Laterality: N/A;  . SHOULDER ARTHROSCOPY WITH ROTATOR CUFF REPAIR AND SUBACROMIAL DECOMPRESSION Right 02/27/2017   Procedure: Right shoulder arthroscopic rotator cuff repair with biceps tenodesis and subacromial decompression;  Surgeon: Nicholes Stairs, MD;  Location: [facility];  Service: Orthopedics;  Laterality: Right;  150 mins  . TONSILLECTOMY    . TUBAL LIGATION       Current Outpatient Medications  Medication Sig Dispense Refill  . allopurinol (ZYLOPRIM) 100 MG tablet TAKE TWO TABLETS BY MOUTH DAILY 30 tablet 5  . benztropine (COGENTIN) 1 MG tablet Take 1 mg by mouth 2 (two) times daily.  2  . divalproex (DEPAKOTE ER) 500 MG 24 hr tablet Take 1,500 mg by mouth at bedtime.     . empagliflozin  (JARDIANCE) 10 MG TABS tablet Take 10 mg by mouth daily. 90 tablet 3  . furosemide (LASIX) 80 MG tablet Take 120 mg by mouth daily. Take 80mg  in the morning and 40mg  in the afternoon    . LATUDA 60 MG TABS Take 60 mg by mouth at bedtime.     . levalbuterol (XOPENEX HFA) 45 MCG/ACT inhaler Inhale 2 puffs into the lungs every 4 (four) hours as needed for wheezing. 1 Inhaler 1  . lisinopril (ZESTRIL) 20 MG tablet Take 20 mg by mouth daily.    . metFORMIN (GLUCOPHAGE) 500 MG tablet Take 500 mg by mouth every evening.    . metoprolol tartrate (LOPRESSOR) 25 MG tablet Take 1 tablet (25 mg total) by mouth 2 (two) times daily.  180 tablet 3  . nitroGLYCERIN (NITROSTAT) 0.4 MG SL tablet Place 1 tablet (0.4 mg total) under the tongue every 5 (five) minutes as needed for chest pain. For chest pain 25 tablet 3  . OLANZapine (ZYPREXA) 15 MG tablet Take 15 mg by mouth at bedtime.    . rosuvastatin (CRESTOR) 20 MG tablet Take 1 tablet (20 mg total) by mouth daily. 90 tablet 3  . UNABLE TO FIND Med Name: One Touch Verio blood glucose meter Lot [id number] x Exp 01/22/2018    . XARELTO 20 MG TABS tablet TAKE ONE TABLET BY MOUTH AT BEDTIME 90 tablet 3   No current facility-administered medications for this encounter.    Allergies:   Eggs or egg-derived products, Tetanus toxoids, and Lamictal [lamotrigine]   Social History:  The patient  reports that she has never smoked. She has never used smokeless tobacco. She reports current alcohol use. She reports that she does not use drugs.   Family History:  The patient's family history includes Alcohol abuse in her father, maternal grandfather, maternal grandmother, mother, paternal grandfather, and paternal grandmother; Alzheimer's disease in her father and mother; Breast cancer (age of onset: 53) in her mother; Diabetes in her brother; Drug abuse in her maternal grandfather; Hypertension in her brother; Lung cancer in her mother; Parkinson's disease in her father;  Schizophrenia in her cousin.   ROS:  Please see the history of present illness.   All other systems are personally reviewed and negative.   Exam:  (Video/Tele Health Call; Exam is subjective and or/visual.) General:  Speaks in full sentences. No resp difficulty. Lungs: Normal respiratory effort with conversation.  Abdomen: Non-distended per patient report Extremities: Pt reports 1+ edema. Neuro: Alert & oriented x 3.   Recent Labs: 02/28/2019: ALT 23 03/01/2019: TSH 8.129 03/02/2019: Magnesium 2.3 03/07/2019: Hemoglobin 14.7; Platelets 117.0 03/25/2019: B Natriuretic Peptide 46.0; BUN 28; Creatinine, Ser 1.37; Potassium 4.6; Sodium 138  Personally reviewed   Wt Readings from Last 3 Encounters:  04/10/19 (!) 144.7 kg (319 lb)  03/28/19 (!) 144.9 kg (319 lb 8 oz)  03/25/19 (!) 143.5 kg (316 lb 6 oz)      ASSESSMENT AND PLAN:  1. Chronic diastolic HF:  -ECHO [id number] EF Normal EF 55-60% Grade IDD  -Echo 7/19 EF 55-60%  -Echo 2/21 EF 60-65%, RV ok  -Recent acute exacerbation in the setting of Afib, treated w/ IV Lasix in 2/21 - Weight continue to drop but still with edema  - continue Lasix 80 mg qam/40 mg qpm - continue Jardiance 10 mg daily  - continue metoprolol 25 mg bid  - Will continue current diuretic regimen for now. If weight going back up can increase to 80 bid. - F/u in Clinic in 2 months.   2. PAF - Maintaining NSR currently - Continue metoprolol for rate control   - CHADSVASC = 3. - Continue Xarelto. No bleeding   3. Morbid obesity: - Needs weight loss - Recommended going back to Y when feasible  4. CAD:  - No s/s ischemia  - no ASA due to Xarelto - continue statin   5. HTN:  - Controlled on current regimen - Continue lisinopril 20 mg qd - Continue metoprolol 25 mg bid - Would benefit from home BP cuff so we can follow closer.    6. Snoring:  - No OSA on previous sleep study.   7. Hyperparathyroidism-  - s/p partial parathyroidectomy 08/2014. Per  PCP.    8. DM2 -ContinueJardiance 10 mg  daily - Continue metformin  - No change  COVID screen The patient does not have any symptoms that suggest any further testing/ screening at this time.  Social distancing reinforced today.  Recommended follow-up:  As above  Relevant cardiac medications were reviewed at length with the patient today.   The patient does not have concerns regarding their medications at this time.   The following changes were made today:  As above  Today, I have spent 14 minutes with the patient with telehealth technology discussing the above issues .    Signed, Glori Bickers, MD  04/24/2019 11:30 AM  [facility] [address] [place] [address] [phone] (office) [phone] (fax)

## 2019-04-24 NOTE — Patient Instructions (Signed)
Continue current medications  Our office will call you to schedule a follow up appoinment  If you have any questions or concerns before your next appointment please send [place] a message through [place] or call our office at [phone].  At the [facility], you and your health needs are our priority. As part of our continuing mission to provide you with exceptional heart care, we have created designated Provider Care Teams. These Care Teams include your primary Cardiologist (physician) and Advanced Practice Providers (APPs- Physician Assistants and Nurse Practitioners) who all work together to provide you with the care you need, when you need it.   You may see any of the following providers on your designated Care Team at your next follow up: [name] Dr Glori Bickers . Dr Loralie Champagne . Darrick Grinder, NP . Lyda Jester, PA . Audry Riles, PharmD   Please be sure to bring in all your medications bottles to every appointment.

## 2019-04-24 NOTE — Addendum Note (Signed)
Encounter addended by: Scarlette Calico, RN on: 04/24/2019 4:32 PM  Actions taken: Clinical Note Signed

## 2019-04-24 NOTE — Progress Notes (Signed)
Message sent to schedulers to arrange f/u. AVS sent via mychart.

## 2019-05-06 ENCOUNTER — Ambulatory Visit
Admission: RE | Admit: 2019-05-06 | Discharge: 2019-05-06 | Disposition: A | Discharge status: Home / Self Care | Payer: BC Managed Care – PPO | Source: Ambulatory Visit | Attending: Obstetrics and Gynecology | Admitting: Obstetrics and Gynecology

## 2019-05-06 ENCOUNTER — Other Ambulatory Visit: Payer: Self-pay

## 2019-05-06 DIAGNOSIS — Z9189 Other specified personal risk factors, not elsewhere classified: Secondary | ICD-10-CM

## 2019-05-06 MED ORDER — GADOBUTROL 1 MMOL/ML IV SOLN
10.0000 mL | Freq: Once | INTRAVENOUS | Status: AC | PRN
  Administered 2019-05-06: 10 mL via INTRAVENOUS

## 2019-05-08 ENCOUNTER — Telehealth: Payer: Self-pay | Admitting: Family Medicine

## 2019-05-08 NOTE — Telephone Encounter (Signed)
Jennifer Physical Therapist from [facility] called to say that  Kathleen [name] is being discharged as of today and that she has met all of her goals

## 2019-05-08 NOTE — Telephone Encounter (Signed)
fyi

## 2019-05-15 ENCOUNTER — Other Ambulatory Visit (HOSPITAL_COMMUNITY): Payer: Self-pay

## 2019-05-15 MED ORDER — FUROSEMIDE 80 MG PO TABS: ORAL_TABLET | ORAL | 5 refills | Status: DC

## 2019-05-21 ENCOUNTER — Telehealth: Payer: Self-pay | Admitting: Family Medicine

## 2019-05-21 MED ORDER — LISINOPRIL 20 MG PO TABS: 20.0000 mg | ORAL_TABLET | Freq: Every day | ORAL | 1 refills | Status: DC

## 2019-05-21 NOTE — Telephone Encounter (Signed)
Medication refilled

## 2019-05-21 NOTE — Telephone Encounter (Signed)
Medication: lisinopril (ZESTRIL) 20 MG tablet [id number]    Has the patient contacted their pharmacy? No. (If no, request that the patient contact the pharmacy for the refill.) (If yes, when and what did the pharmacy advise?)  Preferred Pharmacy (with phone number or street name): [facility], [address], [place] [address]  Phone:  [phone] Fax:  [phone]  DEA #:  --  Agent: Please be advised that RX refills may take up to 3 business days. We ask that you follow-up with your pharmacy.

## 2019-05-25 NOTE — Patient Instructions (Addendum)
It was great to see you again today, I will be in touch with your labs as soon as possible Assuming all looks okay, we can plan to visit in 6 months  Your BP is a bit low today- if you go any lower or start feeling lightheaded please decrease lisinopril to 10 mg  Please continue daily weights and BP readings  Great job with your weight

## 2019-05-25 NOTE — Progress Notes (Addendum)
[facility] [place], [place], [place] [address] [phone]  Date:  05/26/2019   Name:  Kathleen [name]   DOB:  09/29/1955   MRN:  [id number]  PCP:  Darreld Mclean, MD    Chief Complaint: Diabetes   History of Present Illness:  Kathleen [name] is a 64 y.o. very pleasant female patient who presents with the following:  Patient is here today to follow-up on well-controlled diabetes and other chronic health issues Last seen by myself virtually in February with concern of arm pain History of NSTEMI February 2009, CAD, heart failure, A. fib on Xarelto, hypertension, chronic kidney disease, venous stasis bilaterally, bipolar disorder, hyperparathyroidism status post parathyroidectomy 2016 A fib is a new dx- presented to the ER in a fib with RVR in February of this year, was admitted and cardioverted with Cardizem drop, put on xarelto   She was seen by her CHF specialist, Dr. [name] in March and again virtually in April At that time he continued her Lasix 80 a.m./40 p.m., Jardiance, Toprol, Xarelto, statin, lisinopril  She also recently had a positive Cologuard screening, was seen by gastroenterology and underwent colonoscopy on March 18.  She was noted to have several polyps which were removed-it looks like she is on a 3-year follow-up plan for pre-cancerous polyps  COVID-19 vaccine is complete  Foot exam is due Mammogram up-to-date Pap 2020, normal Shingrix is complete  She cannot tell if she is in a fib generally but has not noted any issues BP on the low side today- she has not noted any hypotension Mood is stable, getting better now that she is covid vaccinated and able to be more active  BP Readings from Last 3 Encounters:  05/26/19 104/71  04/10/19 130/70  03/28/19 120/76     Lab Results  Component Value Date   HGBA1C 6.8 (H) 05/26/2019   Allopurinol Cogentin Depakote- currently seeing Pauline Good  PA-C  [place] Zyprexa Latuda Jardiance Metformin Lisinopril Metoprolol Crestor Lasix Xarelto  Patient Active Problem List   Diagnosis Date Noted  . AKI (acute kidney injury) ([place])   . CAP (community acquired pneumonia) 03/01/2019  . Atrial fibrillation with RVR ([place]) 02/28/2019  . Chronic kidney disease (CKD), stage III (moderate) 01/14/2019  . Vitamin D deficiency 07/22/2018  . Complete rotator cuff tear or rupture of right shoulder, not specified as traumatic 02/27/2017  . Auditory hallucination 12/13/2016  . Gout 06/29/2016  . Type 2 diabetes mellitus ([place]) 12/31/2015  . Coronary artery dissection   . Lung nodule seen on imaging study 09/24/2014  . Paroxysmal atrial fibrillation ([place]) 09/11/2014  . Asthmatic bronchitis with exacerbation 05/25/2014  . Primary hyperparathyroidism ([place]) 04/15/2014  . Chronic diastolic heart failure ([place]) 10/02/2013  . Shortness of breath 08/31/2013  . Venous stasis dermatitis of both lower extremities 08/19/2013  . Nocturia 06/12/2013  . Morbid obesity ([place]) 08/09/2012  . Urinary incontinence, urge 07/11/2012  . CAD (coronary artery disease) 10/31/2010  . NEOPLASM OF UNCERTAIN BEHAVIOR OF SKIN 02/11/2010  . Bipolar disorder ([place]) 09/14/2009  . ACUT MYOCARD INFARCT UNS SITE SUBSQT EPIS CARE 07/02/2008  . Hyperlipidemia 05/23/2008  . UNSPECIFIED DISORDER OF THYROID 02/04/2008  . HYPERTENSION, BENIGN ESSENTIAL 02/04/2008  . MYOCARDIAL INFARCTION, HX OF 03/05/2007    Past Medical History:  Diagnosis Date  . Anemia   . Anxiety   . Asthma   . Atrial fibrillation ([place])   .  Bipolar disorder ([place])   . CAD (coronary artery disease)   . Cancer ([place])    basal cell carcinoma on right hand, papilloma left breast  . CHF (congestive heart failure) ([place])    pt seen at heart/vascular spec clinic 08/14/2014   . Chronic kidney disease   . Coarse tremors    hands  . Coronary artery disease   . Depression    . DVT (deep vein thrombosis) in pregnancy    pt. reports that it was post knee surgery, not during pregnancy  . Dyslipidemia   . Fall   . Fatty liver   . Headache    migraines - stopped after menopause  . Heart attack ([place]) 02/2007   non-q-wave with second septal perforator  . Hyperlipidemia   . Hypertension   . Hypothyroidism    history of took medication, has resolved  . Knee pain, left   . Lower extremity deep venous thrombosis (H[facility])    20 years ago   . Measles    hx of in childhood   . Morbid obesity with BMI of 40.0-44.9, adult ([place])   . Pain at surgical incision    Left breast from procedure 09/11/16  . Pneumonia    hx of   . PONV (postoperative nausea and vomiting)    also slow to wake up  . Psychiatric hospitalization 05/2008  . Shingles    20 years ago   . Shortness of breath dyspnea    exercise   . Type 2 diabetes mellitus ([place]) 12/31/2015  . Urinary incontinence   . UTI (urinary tract infection)     Past Surgical History:  Procedure Laterality Date  . BASAL CELL CARCINOMA EXCISION    . BREAST EXCISIONAL BIOPSY Left   . BREAST LUMPECTOMY WITH RADIOACTIVE SEED LOCALIZATION Left 09/11/2016   Procedure: LEFT BREAST LUMPECTOMY WITH RADIOACTIVE SEED LOCALIZATION;  Surgeon: Excell Seltzer, MD;  Location: [facility];  Service: General;  Laterality: Left;  . BREAST MASS EXCISION     age 76, benign tumor  . CARDIAC CATHETERIZATION    . CARDIAC CATHETERIZATION N/A 12/30/2015   Procedure: Left Heart Cath and Coronary Angiography;  Surgeon: Belva Crome, MD;  Location: [facility];  Service: Cardiovascular;  Laterality: N/A;  . COLONOSCOPY  07/15/2009   [facility]  . DILATATION & CURETTAGE/HYSTEROSCOPY WITH MYOSURE N/A 09/22/2016   Procedure: DILATATION & CURETTAGE/HYSTEROSCOPY WITH MYOSURE;  Surgeon: Molli Posey, MD;  Location: [facility];  Service: Gynecology;  Laterality: N/A;  . DILATION AND CURETTAGE OF UTERUS    . KNEE SURGERY     right, removed cartilage  .  PARATHYROIDECTOMY N/A 08/25/2014   Procedure: PARATHYROIDECTOMY;  Surgeon: Armandina Gemma, MD;  Location: [facility];  Service: General;  Laterality: N/A;  . SHOULDER ARTHROSCOPY WITH ROTATOR CUFF REPAIR AND SUBACROMIAL DECOMPRESSION Right 02/27/2017   Procedure: Right shoulder arthroscopic rotator cuff repair with biceps tenodesis and subacromial decompression;  Surgeon: Nicholes Stairs, MD;  Location: [facility];  Service: Orthopedics;  Laterality: Right;  150 mins  . TONSILLECTOMY    . TUBAL LIGATION      Social History   Tobacco Use  . Smoking status: Never Smoker  . Smokeless tobacco: Never Used  Substance Use Topics  . Alcohol use: Yes    Alcohol/week: 0.0 standard drinks    Comment: occ.  . Drug use: No    Family History  Problem Relation Age of Onset  . Breast cancer Mother 49  . Alcohol abuse Mother   .  Alzheimer's disease Mother   . Lung cancer Mother   . Alcohol abuse Father   . Alzheimer's disease Father   . Parkinson's disease Father   . Alcohol abuse Maternal Grandfather   . Drug abuse Maternal Grandfather   . Alcohol abuse Maternal Grandmother   . Alcohol abuse Paternal Grandfather   . Alcohol abuse Paternal Grandmother   . Schizophrenia Cousin   . Diabetes Brother   . Hypertension Brother   . Colon cancer Neg Hx   . Esophageal cancer Neg Hx   . Rectal cancer Neg Hx   . Stomach cancer Neg Hx     Allergies  Allergen Reactions  . Eggs Or Egg-Derived Products Hives and Other (See Comments)    Can eat foods with cooked eggs; CANNOT handle when part of flu vaccine, etc.   . Tetanus Toxoids     Vigorous local reaction to tdap with local pain and itching   . Lamictal [Lamotrigine] Hives and Rash    Medication list has been reviewed and updated.  Current Outpatient Medications on File Prior to Visit  Medication Sig Dispense Refill  . allopurinol (ZYLOPRIM) 100 MG tablet TAKE TWO TABLETS BY MOUTH DAILY 30 tablet 5  . benztropine (COGENTIN) 1 MG tablet Take 1 mg by  mouth 2 (two) times daily.  2  . Biotin w/ Vitamins C & E (HAIR/SKIN/NAILS PO) Take by mouth.    . divalproex (DEPAKOTE ER) 500 MG 24 hr tablet Take 1,500 mg by mouth at bedtime.     . empagliflozin (JARDIANCE) 10 MG TABS tablet Take 10 mg by mouth daily. 90 tablet 3  . furosemide (LASIX) 80 MG tablet Take 80mg  in the morning and 40mg  in the afternoon 135 tablet 5  . LATUDA 60 MG TABS Take 60 mg by mouth at bedtime.     . levalbuterol (XOPENEX HFA) 45 MCG/ACT inhaler Inhale 2 puffs into the lungs every 4 (four) hours as needed for wheezing. 1 Inhaler 1  . lisinopril (ZESTRIL) 20 MG tablet Take 1 tablet (20 mg total) by mouth daily. 90 tablet 1  . metFORMIN (GLUCOPHAGE) 500 MG tablet Take 500 mg by mouth every evening.    . metoprolol tartrate (LOPRESSOR) 25 MG tablet Take 1 tablet (25 mg total) by mouth 2 (two) times daily. 180 tablet 3  . nitroGLYCERIN (NITROSTAT) 0.4 MG SL tablet Place 1 tablet (0.4 mg total) under the tongue every 5 (five) minutes as needed for chest pain. For chest pain 25 tablet 3  . OLANZapine (ZYPREXA) 15 MG tablet Take 15 mg by mouth at bedtime.    . rosuvastatin (CRESTOR) 20 MG tablet Take 1 tablet (20 mg total) by mouth daily. 90 tablet 3  . UNABLE TO FIND Med Name: One Touch Verio blood glucose meter Lot [id number] x Exp 01/22/2018    . XARELTO 20 MG TABS tablet TAKE ONE TABLET BY MOUTH AT BEDTIME 90 tablet 3   No current facility-administered medications on file prior to visit.    Review of Systems:  As per HPI- otherwise negative.   Physical Examination: Vitals:   05/26/19 0819  BP: 104/71  Pulse: 78  Resp: 19  Temp: (!) 97.3 F (36.3 C)  SpO2: 97%   Vitals:   05/26/19 0819  Weight: (!) 310 lb (140.6 kg)  Height: 6' (1.829 m)   Body mass index is 42.04 kg/m. Ideal Body Weight: Weight in (lb) to have BMI = 25: 183.9  GEN: no acute distress.  Tall build, obese,  looks well  HEENT: Atraumatic, Normocephalic.  Ears and Nose: No external  deformity. CV: RRR, No M/G/R. No JVD. No thrill. No extra heart sounds. PULM: CTA B, no wheezes, crackles, rhonchi. No retractions. No resp. distress. No accessory muscle use. ABD: S, NT, ND, +BS. No rebound. No HSM. EXTR: No c/c/e- none today.  Hyperpigmentation of chronic edema on ankles, stable  PSYCH: Normally interactive. Conversant.  Foot exam- today, normal  Wt Readings from Last 3 Encounters:  05/26/19 (!) 310 lb (140.6 kg)  04/10/19 (!) 319 lb (144.7 kg)  03/28/19 (!) 319 lb 8 oz (144.9 kg)     Assessment and Plan: Chronic venous stasis dermatitis of both lower extremities  Chronic renal impairment, unspecified CKD stage - Plan: Comprehensive metabolic panel  Coronary artery disease involving native coronary artery of native heart without angina pectoris  HYPERTENSION, BENIGN ESSENTIAL - Plan: Comprehensive metabolic panel  Primary hyperparathyroidism ([place]) - Plan: PTH, intact (no Ca)  Type 2 diabetes mellitus without complication, unspecified whether long term insulin use ([place]) - Plan: Hemoglobin A1c  Pure hypercholesterolemia - Plan: Lipid panel  Medication monitoring encounter - Plan: Valproic Acid level  Following up on chronic issues today  In SR, fluid status looks good BP is under good control, borderline low Advised to monitor daily weights, BP, and watch for sx of hypotension She has a new psychiatry provider, needs depakote level; will draw for her today Will plan further follow- up pending labs.  This visit occurred during the SARS-CoV-2 public health emergency.  Safety protocols were in place, including screening questions prior to the visit, additional usage of staff PPE, and extensive cleaning of exam room while observing appropriate contact time as indicated for disinfecting solutions.  Moderate medical decision making today  Signed Lamar Blinks, MD  Received her labs as follows, message to patient  Results for orders placed or performed in  visit on 05/26/19  Comprehensive metabolic panel  Result Value Ref Range   Sodium 141 135 - 145 mEq/L   Potassium 4.1 3.5 - 5.1 mEq/L   Chloride 102 96 - 112 mEq/L   CO2 27 19 - 32 mEq/L   Glucose, Bld 144 (H) 70 - 99 mg/dL   BUN 31 (H) 6 - 23 mg/dL   Creatinine, Ser 1.36 (H) 0.40 - 1.20 mg/dL   Total Bilirubin 0.5 0.2 - 1.2 mg/dL   Alkaline Phosphatase 126 (H) 39 - 117 U/L   AST 12 0 - 37 U/L   ALT 13 0 - 35 U/L   Total Protein 6.5 6.0 - 8.3 g/dL   Albumin 4.2 3.5 - 5.2 g/dL   GFR 39.19 (L) >60.00 mL/min   Calcium 9.3 8.4 - 10.5 mg/dL  Hemoglobin A1c  Result Value Ref Range   Hgb A1c MFr Bld 6.8 (H) 4.6 - 6.5 %  Lipid panel  Result Value Ref Range   Cholesterol 133 0 - 200 mg/dL   Triglycerides 158.0 (H) 0.0 - 149.0 mg/dL   HDL 39.20 >39.00 mg/dL   VLDL 31.6 0.0 - 40.0 mg/dL   LDL Cholesterol 63 0 - 99 mg/dL   Total CHOL/HDL Ratio 3    NonHDL 94.16   PTH, intact (no Ca)  Result Value Ref Range   PTH 107 (H) 14 - 64 pg/mL  Valproic Acid level  Result Value Ref Range   Valproic Acid Lvl 58.5 50.0 - 100.0 mg/L   Renal function stable-okay to continue Metformin A1c stable Taking Crestor   Addendum 5/4, PTH  came back elevated Calcium is normal Message patient

## 2019-05-26 ENCOUNTER — Encounter: Payer: Self-pay | Admitting: Family Medicine

## 2019-05-26 ENCOUNTER — Other Ambulatory Visit: Payer: Self-pay

## 2019-05-26 ENCOUNTER — Ambulatory Visit (INDEPENDENT_AMBULATORY_CARE_PROVIDER_SITE_OTHER): Payer: BC Managed Care – PPO | Admitting: Family Medicine

## 2019-05-26 VITALS — BP 104/71 | HR 78 | Temp 97.3°F | Resp 19 | Ht 72.0 in | Wt 310.0 lb

## 2019-05-26 DIAGNOSIS — I2510 Atherosclerotic heart disease of native coronary artery without angina pectoris: Secondary | ICD-10-CM

## 2019-05-26 DIAGNOSIS — I10 Essential (primary) hypertension: Secondary | ICD-10-CM

## 2019-05-26 DIAGNOSIS — E210 Primary hyperparathyroidism: Secondary | ICD-10-CM

## 2019-05-26 DIAGNOSIS — I872 Venous insufficiency (chronic) (peripheral): Secondary | ICD-10-CM

## 2019-05-26 DIAGNOSIS — N189 Chronic kidney disease, unspecified: Secondary | ICD-10-CM

## 2019-05-26 DIAGNOSIS — Z5181 Encounter for therapeutic drug level monitoring: Secondary | ICD-10-CM

## 2019-05-26 DIAGNOSIS — E7800 Pure hypercholesterolemia, unspecified: Secondary | ICD-10-CM | POA: Diagnosis not present

## 2019-05-26 DIAGNOSIS — E119 Type 2 diabetes mellitus without complications: Secondary | ICD-10-CM

## 2019-05-26 LAB — COMPREHENSIVE METABOLIC PANEL
ALT: 13 U/L (ref 0–35)
AST: 12 U/L (ref 0–37)
Albumin: 4.2 g/dL (ref 3.5–5.2)
Alkaline Phosphatase: 126 U/L — ABNORMAL HIGH (ref 39–117)
BUN: 31 mg/dL — ABNORMAL HIGH (ref 6–23)
CO2: 27 mEq/L (ref 19–32)
Calcium: 9.3 mg/dL (ref 8.4–10.5)
Chloride: 102 mEq/L (ref 96–112)
Creatinine, Ser: 1.36 mg/dL — ABNORMAL HIGH (ref 0.40–1.20)
GFR: 39.19 mL/min — ABNORMAL LOW (ref >60.00)
Glucose, Bld: 144 mg/dL — ABNORMAL HIGH (ref 70–99)
Potassium: 4.1 mEq/L (ref 3.5–5.1)
Sodium: 141 mEq/L (ref 135–145)
Total Bilirubin: 0.5 mg/dL (ref 0.2–1.2)
Total Protein: 6.5 g/dL (ref 6.0–8.3)

## 2019-05-26 LAB — HEMOGLOBIN A1C: Hgb A1c MFr Bld: 6.8 % — ABNORMAL HIGH (ref 4.6–6.5)

## 2019-05-26 LAB — LIPID PANEL
Cholesterol: 133 mg/dL (ref 0–200)
HDL: 39.2 mg/dL (ref >39.00)
LDL Cholesterol: 63 mg/dL (ref 0–99)
NonHDL: 94.16
Total CHOL/HDL Ratio: 3
Triglycerides: 158 mg/dL — ABNORMAL HIGH (ref 0.0–149.0)
VLDL: 31.6 mg/dL (ref 0.0–40.0)

## 2019-05-27 ENCOUNTER — Encounter: Payer: Self-pay | Admitting: Family Medicine

## 2019-05-27 LAB — PARATHYROID HORMONE, INTACT (NO CA): PTH: 107 pg/mL — ABNORMAL HIGH (ref 14–64)

## 2019-05-27 LAB — VALPROIC ACID LEVEL: Valproic Acid Lvl: 58.5 mg/L (ref 50.0–100.0)

## 2019-06-03 ENCOUNTER — Telehealth: Payer: Self-pay | Admitting: Family Medicine

## 2019-06-03 ENCOUNTER — Encounter: Payer: Self-pay | Admitting: Family Medicine

## 2019-06-03 DIAGNOSIS — R7989 Other specified abnormal findings of blood chemistry: Secondary | ICD-10-CM

## 2019-06-03 MED ORDER — CALCITRIOL 0.25 MCG PO CAPS: 0.2500 ug | ORAL_CAPSULE | Freq: Every day | ORAL | 2 refills | Status: DC

## 2019-06-03 NOTE — Telephone Encounter (Signed)
-----   Message from Philemon Kingdom, MD sent at 05/28/2019  9:50 AM EDT ----- Iona Coach, I would suggest to check with nephrology first and if they do not think this is from her CKD, then I would like to see her back. Thank you, Salena Saner ----- Message ----- From: Darreld Mclean, MD Sent: 05/28/2019   8:47 AM EDT To: Philemon Kingdom, MD  Maryanna Shape I hope that you are well!    I recently checked PTH level for this pt, it is elevated again.  Calcium is normal Do you want to see her in follow-up?    Thanks so much!   Jess Aasir Daigler

## 2019-06-03 NOTE — Telephone Encounter (Signed)
I spoke with Kathleen [name]'s nephrologist Dr Elmarie Shiley who did think elevated PTH is due to renal disease. He would like me to start her on calcitriol 0.68mcg daily and follow-up for a calcium level in a few weeks.  Will communicate this to pt and send in rx

## 2019-06-26 ENCOUNTER — Other Ambulatory Visit: Payer: Self-pay

## 2019-06-26 ENCOUNTER — Encounter (HOSPITAL_COMMUNITY): Payer: Self-pay

## 2019-06-26 ENCOUNTER — Ambulatory Visit (HOSPITAL_COMMUNITY)
Admission: RE | Admit: 2019-06-26 | Discharge: 2019-06-26 | Disposition: A | Discharge status: Home / Self Care | Payer: BC Managed Care – PPO | Source: Ambulatory Visit | Attending: Adult Health | Admitting: Adult Health

## 2019-06-26 VITALS — BP 124/68 | HR 88 | Wt 312.8 lb

## 2019-06-26 DIAGNOSIS — E213 Hyperparathyroidism, unspecified: Secondary | ICD-10-CM | POA: Insufficient documentation

## 2019-06-26 DIAGNOSIS — Z888 Allergy status to other drugs, medicaments and biological substances status: Secondary | ICD-10-CM | POA: Insufficient documentation

## 2019-06-26 DIAGNOSIS — Z91012 Allergy to eggs: Secondary | ICD-10-CM | POA: Diagnosis not present

## 2019-06-26 DIAGNOSIS — Z85828 Personal history of other malignant neoplasm of skin: Secondary | ICD-10-CM | POA: Diagnosis not present

## 2019-06-26 DIAGNOSIS — R0683 Snoring: Secondary | ICD-10-CM | POA: Diagnosis not present

## 2019-06-26 DIAGNOSIS — N189 Chronic kidney disease, unspecified: Secondary | ICD-10-CM | POA: Diagnosis not present

## 2019-06-26 DIAGNOSIS — F319 Bipolar disorder, unspecified: Secondary | ICD-10-CM | POA: Insufficient documentation

## 2019-06-26 DIAGNOSIS — Z803 Family history of malignant neoplasm of breast: Secondary | ICD-10-CM | POA: Insufficient documentation

## 2019-06-26 DIAGNOSIS — Z8249 Family history of ischemic heart disease and other diseases of the circulatory system: Secondary | ICD-10-CM | POA: Insufficient documentation

## 2019-06-26 DIAGNOSIS — I5032 Chronic diastolic (congestive) heart failure: Secondary | ICD-10-CM | POA: Diagnosis not present

## 2019-06-26 DIAGNOSIS — E1122 Type 2 diabetes mellitus with diabetic chronic kidney disease: Secondary | ICD-10-CM | POA: Insufficient documentation

## 2019-06-26 DIAGNOSIS — I10 Essential (primary) hypertension: Secondary | ICD-10-CM | POA: Diagnosis not present

## 2019-06-26 DIAGNOSIS — E039 Hypothyroidism, unspecified: Secondary | ICD-10-CM | POA: Diagnosis not present

## 2019-06-26 DIAGNOSIS — I2510 Atherosclerotic heart disease of native coronary artery without angina pectoris: Secondary | ICD-10-CM

## 2019-06-26 DIAGNOSIS — E119 Type 2 diabetes mellitus without complications: Secondary | ICD-10-CM | POA: Insufficient documentation

## 2019-06-26 DIAGNOSIS — I509 Heart failure, unspecified: Secondary | ICD-10-CM | POA: Diagnosis present

## 2019-06-26 DIAGNOSIS — Z87440 Personal history of urinary (tract) infections: Secondary | ICD-10-CM | POA: Insufficient documentation

## 2019-06-26 DIAGNOSIS — Z86718 Personal history of other venous thrombosis and embolism: Secondary | ICD-10-CM | POA: Diagnosis not present

## 2019-06-26 DIAGNOSIS — E785 Hyperlipidemia, unspecified: Secondary | ICD-10-CM | POA: Diagnosis not present

## 2019-06-26 DIAGNOSIS — Z833 Family history of diabetes mellitus: Secondary | ICD-10-CM | POA: Diagnosis not present

## 2019-06-26 DIAGNOSIS — I480 Paroxysmal atrial fibrillation: Secondary | ICD-10-CM

## 2019-06-26 DIAGNOSIS — Z887 Allergy status to serum and vaccine status: Secondary | ICD-10-CM | POA: Insufficient documentation

## 2019-06-26 DIAGNOSIS — Z7901 Long term (current) use of anticoagulants: Secondary | ICD-10-CM | POA: Insufficient documentation

## 2019-06-26 DIAGNOSIS — J45909 Unspecified asthma, uncomplicated: Secondary | ICD-10-CM | POA: Insufficient documentation

## 2019-06-26 DIAGNOSIS — I130 Hypertensive heart and chronic kidney disease with heart failure and stage 1 through stage 4 chronic kidney disease, or unspecified chronic kidney disease: Secondary | ICD-10-CM | POA: Insufficient documentation

## 2019-06-26 DIAGNOSIS — Z6841 Body Mass Index (BMI) 40.0 and over, adult: Secondary | ICD-10-CM | POA: Diagnosis not present

## 2019-06-26 DIAGNOSIS — Z801 Family history of malignant neoplasm of trachea, bronchus and lung: Secondary | ICD-10-CM | POA: Insufficient documentation

## 2019-06-26 DIAGNOSIS — I252 Old myocardial infarction: Secondary | ICD-10-CM | POA: Insufficient documentation

## 2019-06-26 DIAGNOSIS — Z79899 Other long term (current) drug therapy: Secondary | ICD-10-CM | POA: Insufficient documentation

## 2019-06-26 DIAGNOSIS — Z7984 Long term (current) use of oral hypoglycemic drugs: Secondary | ICD-10-CM | POA: Insufficient documentation

## 2019-06-26 NOTE — Patient Instructions (Signed)
It was great to see you today! No medication changes are needed at this time.  Your physician discussed the importance of regular exercise and recommended that you start or continue a regular exercise program for good health. You have been referred to [facility] Address: [address], [place], [place] [address] Phone: [phone] -they will be in touch with to overview program and schedule orientation  Your physician recommends that you schedule a follow-up appointment in: 4 months with Dr [name]  Do the following things EVERYDAY: 1) Weigh yourself in the morning before breakfast. Write it down and keep it in a log. 2) Take your medicines as prescribed 3) Eat low salt foods--Limit salt (sodium) to 2000 mg per day.  4) Stay as active as you can everyday 5) Limit all fluids for the day to less than 2 liters  At the [facility], you and your health needs are our priority. As part of our continuing mission to provide you with exceptional heart care, we have created designated Provider Care Teams. These Care Teams include your primary Cardiologist (physician) and Advanced Practice Providers (APPs- Physician Assistants and Nurse Practitioners) who all work together to provide you with the care you need, when you need it.   You may see any of the following providers on your designated Care Team at your next follow up: [name] Dr Glori Bickers . Dr Loralie Champagne . Darrick Grinder, NP . Lyda Jester, PA . Audry Riles, PharmD   Please be sure to bring in all your medications bottles to every appointment.

## 2019-06-26 NOTE — Progress Notes (Signed)
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 Sleep study 11/2013 was negative. Also has history of A fib.   Had NSTEMI in 2/09 had cath at that time with occlusion of septal perforator. Otherwise normal arteries. Was admitted for CP in 11/13. CE normal. Underwent dobutamine stress echo which was normal.   Admitted 09/11/14 for Afib RVR. She converted to sinus tach with dilt drip and was transitioned to Toprol-XL 25 mg daily. She was in NSR at the time of discharge. Discharge weight was 325 lb.  Admitted 7/12-7/14/17 with chest pain. CTA negative for PE. Troponins negative. Had Myoview 08/05/15 with "subtle" anterior wall defect.Felt tobe breast attenuation. Echo stable as below.   Holter monitor in 7/19: NSR with occasional PVCs and PACs. No other abnormalities. Multiple diary events which occasionally correspond to PVCs but often during NSR.  She was recently admitted 2/21 for atrial fibrillation w/ RVR in the setting of CAP and also w/ a/c diastolic HF, requiring IV diuretics. She was admitted by [facility] and general cardiology consulted. Echo repeated and EF 60-65%. RV ok. PNA treated w/ abx. AFib w/ Cardizem and metoprolol w/ spontaneous conversion back to NSR. She diuresed well w/ IV Lasix and transitioned back to PO Lasix. Hospital course also notable for AKI. SCr rose to 2.09 during admit but improved down to 1.46 on d/c. D/c wt 330 lb.  Today she returns for HF follow up. Overall feeling fine. Remains SOB with exertion. Denies PND/Orthopnea. Appetite ok.  Had pizza last night.  No fever or chills. Weight at home has gone up 5 pounds. Walking about 10 minutes in her home. Taking all medications.   Past Medical History:  Diagnosis Date  . Anemia   . Anxiety   . Asthma   . Atrial fibrillation ([place])   . Bipolar disorder ([place])   . CAD (coronary artery disease)   . Cancer ([place])    basal cell carcinoma on right hand, papilloma left breast  . CHF (congestive heart failure) ([place])    pt seen at heart/vascular spec clinic 08/14/2014   . Chronic kidney disease   . Coarse tremors    hands  . Coronary artery disease   . Depression   . DVT (deep vein thrombosis) in pregnancy    pt. reports that it was post knee surgery, not during pregnancy  . Dyslipidemia   . Fall   . Fatty liver   . Headache    migraines - stopped after menopause  . Heart attack ([place]) 02/2007   non-q-wave with second septal perforator  . Hyperlipidemia   . Hypertension   . Hypothyroidism    history of took medication, has resolved  . Knee pain, left   . Lower extremity deep venous thrombosis (HCC)    20 years ago   . Measles    hx of in childhood   . Morbid obesity with BMI of 40.0-44.9, adult ([place])   . Pain at surgical incision    Left breast from procedure 09/11/16  . Pneumonia  hx of   . PONV (postoperative nausea and vomiting)    also slow to wake up  . Psychiatric hospitalization 05/2008  . Shingles    20 years ago   . Shortness of breath dyspnea    exercise   . Type 2 diabetes mellitus ([place]) 12/31/2015  . Urinary incontinence   . UTI (urinary tract infection)    Past Surgical History:  Procedure Laterality Date  . BASAL CELL CARCINOMA EXCISION    . BREAST EXCISIONAL BIOPSY Left   . BREAST LUMPECTOMY WITH RADIOACTIVE SEED LOCALIZATION Left 09/11/2016   Procedure: LEFT BREAST LUMPECTOMY WITH RADIOACTIVE SEED LOCALIZATION;  Surgeon: Excell Seltzer, MD;  Location: [place];  Service: General;  Laterality: Left;  . BREAST MASS EXCISION     age 62, benign  tumor  . CARDIAC CATHETERIZATION    . CARDIAC CATHETERIZATION N/A 12/30/2015   Procedure: Left Heart Cath and Coronary Angiography;  Surgeon: Belva Crome, MD;  Location: [place] [facility];  Service: Cardiovascular;  Laterality: N/A;  . COLONOSCOPY  07/15/2009   [facility]  . DILATATION & CURETTAGE/HYSTEROSCOPY WITH MYOSURE N/A 09/22/2016   Procedure: DILATATION & CURETTAGE/HYSTEROSCOPY WITH MYOSURE;  Surgeon: Molli Posey, MD;  Location: [facility];  Service: Gynecology;  Laterality: N/A;  . DILATION AND CURETTAGE OF UTERUS    . KNEE SURGERY     right, removed cartilage  . PARATHYROIDECTOMY N/A 08/25/2014   Procedure: PARATHYROIDECTOMY;  Surgeon: Armandina Gemma, MD;  Location: [facility];  Service: General;  Laterality: N/A;  . SHOULDER ARTHROSCOPY WITH ROTATOR CUFF REPAIR AND SUBACROMIAL DECOMPRESSION Right 02/27/2017   Procedure: Right shoulder arthroscopic rotator cuff repair with biceps tenodesis and subacromial decompression;  Surgeon: Nicholes Stairs, MD;  Location: [place];  Service: Orthopedics;  Laterality: Right;  150 mins  . TONSILLECTOMY    . TUBAL LIGATION       Current Outpatient Medications  Medication Sig Dispense Refill  . allopurinol (ZYLOPRIM) 100 MG tablet TAKE TWO TABLETS BY MOUTH DAILY 30 tablet 5  . benztropine (COGENTIN) 1 MG tablet Take 1 mg by mouth 2 (two) times daily.  2  . Biotin w/ Vitamins C & E (HAIR/SKIN/NAILS PO) Take by mouth.    . calcitRIOL (ROCALTROL) 0.25 MCG capsule Take 1 capsule (0.25 mcg total) by mouth daily. 30 capsule 2  . divalproex (DEPAKOTE ER) 500 MG 24 hr tablet Take 1,500 mg by mouth at bedtime.     . empagliflozin (JARDIANCE) 10 MG TABS tablet Take 10 mg by mouth daily. 90 tablet 3  . furosemide (LASIX) 80 MG tablet Take 80mg  in the morning and 40mg  in the afternoon 135 tablet 5  . LATUDA 60 MG TABS Take 60 mg by mouth at bedtime.     . levalbuterol (XOPENEX HFA) 45 MCG/ACT inhaler Inhale 2 puffs into the lungs every 4 (four) hours as needed for  wheezing. 1 Inhaler 1  . lisinopril (ZESTRIL) 20 MG tablet Take 1 tablet (20 mg total) by mouth daily. 90 tablet 1  . metFORMIN (GLUCOPHAGE) 500 MG tablet Take 500 mg by mouth every evening.    . metoprolol tartrate (LOPRESSOR) 25 MG tablet Take 1 tablet (25 mg total) by mouth 2 (two) times daily. 180 tablet 3  . nitroGLYCERIN (NITROSTAT) 0.4 MG SL tablet Place 1 tablet (0.4 mg total) under the tongue every 5 (five) minutes as needed for chest pain. For chest pain 25 tablet 3  . OLANZapine (ZYPREXA) 15 MG tablet Take 15 mg by mouth at  bedtime.    . rosuvastatin (CRESTOR) 20 MG tablet Take 1 tablet (20 mg total) by mouth daily. 90 tablet 3  . UNABLE TO FIND Med Name: One Touch Verio blood glucose meter Lot [id number] x Exp 01/22/2018    . XARELTO 20 MG TABS tablet TAKE ONE TABLET BY MOUTH AT BEDTIME 90 tablet 3   No current facility-administered medications for this encounter.    Allergies:   Eggs or egg-derived products, Tetanus toxoids, and Lamictal [lamotrigine]   Social History:  The patient  reports that she has never smoked. She has never used smokeless tobacco. She reports current alcohol use. She reports that she does not use drugs.   Family History:  The patient's family history includes Alcohol abuse in her father, maternal grandfather, maternal grandmother, mother, paternal grandfather, and paternal grandmother; Alzheimer's disease in her father and mother; Breast cancer (age of onset: 21) in her mother; Diabetes in her brother; Drug abuse in her maternal grandfather; Hypertension in her brother; Lung cancer in her mother; Parkinson's disease in her father; Schizophrenia in her cousin.   ROS:  Please see the history of present illness.   All other systems are personally reviewed and negative.  Vitals:   06/26/19 1403  BP: 124/68  Pulse: 88  SpO2: 98%   General:  No resp difficulty. Walked in the clinic.  HEENT: normal Neck: supple. no JVD. Carotids 2+ bilat; no bruits. No  lymphadenopathy or thryomegaly appreciated. Cor: PMI nondisplaced. Regular rate & rhythm. No rubs, gallops or murmurs. Lungs: clear Abdomen: obese soft, nontender, nondistended. No hepatosplenomegaly. No bruits or masses. Good bowel sounds. Extremities: no cyanosis, clubbing, rash, R and LLE chronic hyperpigmentation. Trace lower extremity edema.  Neuro: alert & orientedx3, cranial nerves grossly intact. moves all 4 extremities w/o difficulty. Affect pleasant  Recent Labs: 03/01/2019: TSH 8.129 03/02/2019: Magnesium 2.3 03/07/2019: Hemoglobin 14.7; Platelets 117.0 03/25/2019: B Natriuretic Peptide 46.0 05/26/2019: ALT 13; BUN 31; Creatinine, Ser 1.36; Potassium 4.1; Sodium 141  Personally reviewed   Wt Readings from Last 3 Encounters:  06/26/19 (!) 141.9 kg (312 lb 12.8 oz)  05/26/19 (!) 140.6 kg (310 lb)  04/10/19 (!) 144.7 kg (319 lb)      ASSESSMENT AND PLAN:  1. Chronic diastolic HF:  -ECHO [id number] EF Normal EF 55-60% Grade IDD  -Echo 7/19 EF 55-60%  -Echo 2/21 EF 60-65%, RV ok  -Recent acute exacerbation in the setting of Afib, treated w/ IV Lasix in 2/21 - NYHA III. Volume status - continue Lasix 80 mg qam/40 mg qpm - continue Jardiance 10 mg daily  - continue metoprolol 25 mg bid  - Recent BMET stable 05/26/19   2. PAF - Regular on exam.  -  Continue metoprolol for rate control   - CHADSVASC = 3. - Continue Xarelto. No bleeding   3. Morbid obesity: -Body mass index is 42.42 kg/m. - Discussed portion control - Refer to Prep progam.   4. CAD:  - No s/s ischemia  - no ASA due to Xarelto - continue statin   5. HTN:  -Stable.  - Continue lisinopril 20 mg qd - Continue metoprolol 25 mg bid  6. Snoring:  - No OSA on previous sleep study.   7. Hyperparathyroidism-  - s/p partial parathyroidectomy 08/2014. Per PCP.   8. DM2 -ContinueJardiance 10 mg daily - Continue metformin  - No change\  Follow up 4  months with Dr [name]   Signed, Darrick Grinder,  NP  06/26/2019 2:06 PM  [facility] [place] [phone] (office) [phone] (fax)

## 2019-07-03 ENCOUNTER — Telehealth: Payer: Self-pay

## 2019-07-03 NOTE — Telephone Encounter (Signed)
Call placed to patient reference referral from CHF clinic for PREP. Explained program and next start date of 6/29 T/TH 230p-345p patient agreeable to schedule. Location: [facility].   Will call back to schedule intake appt. Given contact number.

## 2019-07-08 ENCOUNTER — Telehealth: Payer: Self-pay

## 2019-07-08 NOTE — Telephone Encounter (Signed)
Call placed to schedule intake appt for PREP class  Scheduled for 6/22 at 10am at [facility]

## 2019-07-15 NOTE — Progress Notes (Signed)
[facility] Report   Patient Details  Name: Kathleen [name] MRN: [id number] Date of Birth: 10-10-1955 Age: 64 y.o. PCP: Darreld Mclean, MD  Vitals:   07/15/19 1032  BP: 132/80  Pulse: 82  SpO2: 94%  Weight: (!) 316 lb 6.4 oz (143.5 kg)      [facility] Eval - 07/15/19 1000      Referral    Referring Provider CHF clinic   [place]   Reason for referral Diabetes;Heart Failure;High Cholesterol;Hypertension;Inactivity;Obesitity/Overweight    Program Start Date 07/22/19   Tues/Thurs 230p-345p x 12 wks     Measurement   Waist Circumference 59 inches    Hip Circumference 63 inches    Body fat 49 percent      Information for Trainer   Goals start exercise program, get A1C down to 6.5, improve sit to stand    Current Exercise none    Orthopedic Concerns Shoulders, knees    Pertinent Medical History CHF, HTN, DM, Afib, MI, High cholesterol, tremors     Current Barriers none    Restrictions/Precautions Diabetic snack before exercise;Fall risk;Assistive device    Medications that affect exercise Medication causing dizziness/drowsiness;Asthma inhaler;Beta blocker   encouraged to bring snack for after, cane for walking      Timed Up and Go (TUGS)   Timed Up and Go High risk >13 seconds   difficulty getting up out of chair, fall within last 6 month     Mobility and Daily Activities   I find it easy to walk up or down two or more flights of stairs. 1    I have no trouble taking out the trash. 2    I do housework such as vacuuming and dusting on my own without difficulty. 1    I can easily lift a gallon of milk (8lbs). 2    I can easily walk a mile. 1    I have no trouble reaching into high cupboards or reaching down to pick up something from the floor. 1    I do not have trouble doing out-door work such as Armed forces logistics/support/administrative officer, raking leaves, or gardening. 1      Mobility and Daily Activities   I feel younger than my age. 1    I feel independent. 1    I feel energetic.  1    I live an active life.  1    I feel strong. 1    I feel healthy. 1    I feel active as other people my age. 1      How fit and strong are you.   Fit and Strong Total Score 16          Past Medical History:  Diagnosis Date  . Anemia   . Anxiety   . Asthma   . Atrial fibrillation ([place])   . Bipolar disorder ([place])   . CAD (coronary artery disease)   . Cancer ([place])    basal cell carcinoma on right hand, papilloma left breast  . CHF (congestive heart failure) ([place])    pt seen at heart/vascular spec clinic 08/14/2014   . Chronic kidney disease   . Coarse tremors    hands  . Coronary artery disease   . Depression   . DVT (deep vein thrombosis) in pregnancy    pt. reports that it was post knee surgery, not during pregnancy  . Dyslipidemia   . Fall   . Fatty liver   . Headache  migraines - stopped after menopause  . Heart attack ([place]) 02/2007   non-q-wave with second septal perforator  . Hyperlipidemia   . Hypertension   . Hypothyroidism    history of took medication, has resolved  . Knee pain, left   . Lower extremity deep venous thrombosis (HCC)    20 years ago   . Measles    hx of in childhood   . Morbid obesity with BMI of 40.0-44.9, adult ([place])   . Pain at surgical incision    Left breast from procedure 09/11/16  . Pneumonia    hx of   . PONV (postoperative nausea and vomiting)    also slow to wake up  . Psychiatric hospitalization 05/2008  . Shingles    20 years ago   . Shortness of breath dyspnea    exercise   . Type 2 diabetes mellitus ([place]) 12/31/2015  . Urinary incontinence   . UTI (urinary tract infection)    Past Surgical History:  Procedure Laterality Date  . BASAL CELL CARCINOMA EXCISION    . BREAST EXCISIONAL BIOPSY Left   . BREAST LUMPECTOMY WITH RADIOACTIVE SEED LOCALIZATION Left 09/11/2016   Procedure: LEFT BREAST LUMPECTOMY WITH RADIOACTIVE SEED LOCALIZATION;  Surgeon: Excell Seltzer, MD;  Location: [facility];  Service: General;   Laterality: Left;  . BREAST MASS EXCISION     age 53, benign tumor  . CARDIAC CATHETERIZATION    . CARDIAC CATHETERIZATION N/A 12/30/2015   Procedure: Left Heart Cath and Coronary Angiography;  Surgeon: Belva Crome, MD;  Location: [place] [facility];  Service: Cardiovascular;  Laterality: N/A;  . COLONOSCOPY  07/15/2009   [facility]  . DILATATION & CURETTAGE/HYSTEROSCOPY WITH MYOSURE N/A 09/22/2016   Procedure: DILATATION & CURETTAGE/HYSTEROSCOPY WITH MYOSURE;  Surgeon: Molli Posey, MD;  Location: [facility];  Service: Gynecology;  Laterality: N/A;  . DILATION AND CURETTAGE OF UTERUS    . KNEE SURGERY     right, removed cartilage  . PARATHYROIDECTOMY N/A 08/25/2014   Procedure: PARATHYROIDECTOMY;  Surgeon: Armandina Gemma, MD;  [facility];  Service: General;  Laterality: N/A;  . SHOULDER ARTHROSCOPY WITH ROTATOR CUFF REPAIR AND SUBACROMIAL DECOMPRESSION Right 02/27/2017   Procedure: Right shoulder arthroscopic rotator cuff repair with biceps tenodesis and subacromial decompression;  Surgeon: Nicholes Stairs, MD;  Location: [facility];  Service: Orthopedics;  Laterality: Right;  150 mins  . TONSILLECTOMY    . TUBAL LIGATION     Social History   Tobacco Use  Smoking Status Never Smoker  Smokeless Tobacco Never Used   Given handouts for salt and sugar (brochures). Asked to start to read labels and notice current level of salt and sugar in diet. Does eat yogurts.  Keeping Na at 1500 mg per day Keeping Added sugars below 24 g per day Only drinks water, coffee-occ soda  Will go in more depth with diet as pt is interested in learning to eat better.     Barnett Hatter 07/15/2019, 10:37 AM

## 2019-07-29 NOTE — Progress Notes (Signed)
[facility] PREP Weekly Session   Patient Details  Name: Kathleen [name] MRN: [id number] Date of Birth: 01/19/1956 Age: 64 y.o. PCP: Darreld Mclean, MD  Vitals:   07/29/19 1633  Weight: (!) 312 lb 9.6 oz (141.8 kg)     [name] - 07/29/19 1600      Weekly Session   Topic Discussed Importance of resistance training;Other ways to be active    Minutes exercised this week 100 minutes    Classes attended to date 3          Fun things since last meeting: 4th of July picnic Grateful for: my children, my friends Nutrition celebration: less salt and sugar Less carbs Barriers/struggles: what do I eat for breakfast? Recipes for low carb meals, snacks   Pam M Korinne Greenstein 07/29/2019, 4:35 PM

## 2019-08-05 NOTE — Progress Notes (Signed)
[facility] PREP Weekly Session   Patient Details  Name: Kathleen [name] MRN: [id number] Date of Birth: November 22, 1955 Age: 64 y.o. PCP: Darreld Mclean, MD  Vitals:   08/05/19 1656  Weight: (!) 312 lb 3.2 oz (141.6 kg)     [name] - 08/05/19 1600      Weekly Session   Topic Discussed Healthy eating tips    Minutes exercised this week 225 minutes    Classes attended to date 5          Fun things since last meeting: walked around the neighborhood Grateful for: my clothes are looser, rain Nutrition celebration: low carb/low salt recipes Barriers/struggles: breakfast   Barnett Hatter 08/05/2019, 4:57 PM

## 2019-08-12 NOTE — Progress Notes (Signed)
[facility] PREP Weekly Session   Patient Details  Name: Kathleen [name] MRN: [id number] Date of Birth: [birth year] Age: 64 y.o. PCP: Darreld Mclean, MD  Vitals:   08/12/19 1608  Weight: (!) 308 lb 6.4 oz (139.9 kg)     [name] - 08/12/19 1600      Weekly Session   Topic Discussed Health habits    Minutes exercised this week 120 minutes    Classes attended to date 7         fun things since last meeting: went to a movie Grateful for: friends, church, sleep better, I can walk without my cane for short distances Nutrition celebration: less sugar (carbs), more vitamins Barriers/struggles: sit to stand   [employer] 08/12/2019, 4:09 PM

## 2019-08-19 NOTE — Progress Notes (Signed)
[facility] PREP Weekly Session   Patient Details  Name: Kathleen [name] MRN: [id number] Date of Birth: February 06, 1955 Age: 65 y.o. PCP: Darreld Mclean, MD  Vitals:   08/19/19 1551  Weight: (!) 310 lb 3.2 oz (140.7 kg)     [name] - 08/19/19 1500      Weekly Session   Topic Discussed Restaurant Eating    Minutes exercised this week 150 minutes    Classes attended to date 11          Grateful for: my sister who supports my changes for diet and exercise Nutrition celebration: lower salt and lower sugar  Barriers/struggles: cooking for myself, not on my goals to lose weight.   Barnett Hatter 08/19/2019, 3:53 PM

## 2019-08-26 ENCOUNTER — Telehealth (HOSPITAL_COMMUNITY): Payer: Self-pay | Admitting: *Deleted

## 2019-08-26 NOTE — Progress Notes (Signed)
[facility] PREP Weekly Session   Patient Details  Name: Kathleen [name] MRN: [id number] Date of Birth: 1955/10/06 Age: 64 y.o. PCP: Darreld Mclean, MD  Vitals:   08/26/19 1632  Weight: (!) 311 lb 12.8 oz (141.4 kg)     [name] - 08/26/19 1600      Weekly Session   Topic Discussed Finding support    Minutes exercised this week 160 minutes    Classes attended to date 72          Grateful for sunshine, friends Barriers/struggles: gaining weight. Had gotten down to 294 but gained weight and is frustrated.  Will check with HF MD ? Fluid gain   Barnett Hatter 08/26/2019, 4:33 PM

## 2019-08-26 NOTE — Telephone Encounter (Signed)
Pt left VM stating her weight is up 6lbs x 1 week. Pt denies shortness of breath or edema but thought she should report the weight gain.  Routed to [place] for advice

## 2019-08-27 NOTE — Telephone Encounter (Signed)
Pt called back to let us know she received the message and took extra lasix today, she will let us know if it doesn't help

## 2019-08-27 NOTE — Telephone Encounter (Signed)
Left detailed VM and requested return call.

## 2019-08-27 NOTE — Telephone Encounter (Signed)
Please ask her to take an extra 40 mg of lasix tonight and tomorrow.   Donterius Filley NP-C  2:02 PM

## 2019-08-28 ENCOUNTER — Other Ambulatory Visit: Payer: Self-pay | Admitting: Family Medicine

## 2019-08-28 DIAGNOSIS — R7989 Other specified abnormal findings of blood chemistry: Secondary | ICD-10-CM

## 2019-09-03 NOTE — Progress Notes (Signed)
[facility] PREP Weekly Session   Patient Details  Name: Kathleen [name] MRN: [id number] Date of Birth: 10-25-1955 Age: 64 y.o. PCP: Darreld Mclean, MD  Vitals:   09/02/19 1430  Weight: (!) 311 lb (141.1 kg)     [name] - 09/03/19 1000      Weekly Session   Topic Discussed Expectations and non-scale victories    Minutes exercised this week 170 minutes    Classes attended to date 36          Fun things since last meeting: dinner out with my daughter Grateful for: friends Nutrition celebration: less salt Barrier/struggles: more water  Talked about stalled weight loss. Talked about keeping meals spaced apart evenly to keep blood sugar stable. Was skipping meals. Talked about metabolism and need to fuel to burn calories.   Pam Tally Joe 09/03/2019, 11:00 AM

## 2019-09-04 ENCOUNTER — Other Ambulatory Visit: Payer: Self-pay | Admitting: Family Medicine

## 2019-09-09 NOTE — Progress Notes (Signed)
[facility] PREP Weekly Session   Patient Details  Name: Kathleen [name] MRN: [id number] Date of Birth: 10-19-1955 Age: 64 y.o. PCP: Darreld Mclean, MD  Vitals:   09/09/19 1601  Weight: (!) 310 lb 3.2 oz (140.7 kg)     [name] - 09/09/19 1600      Weekly Session   Topic Discussed --   portion size    Minutes exercised this week 145 minutes    Classes attended to date 83          Fun things since last meeting: exercise Grateful for: friends, family, church, exercise is easier Nutrition celebration: a lot less salt, more salad Barriers/struggles: sore muscles, measuring, where can I find disposable masks    Barnett Hatter 09/09/2019, 4:02 PM

## 2019-09-16 ENCOUNTER — Other Ambulatory Visit (HOSPITAL_COMMUNITY): Payer: Self-pay

## 2019-09-16 ENCOUNTER — Encounter: Payer: Self-pay | Admitting: Family Medicine

## 2019-09-16 ENCOUNTER — Other Ambulatory Visit: Payer: Self-pay | Admitting: Obstetrics and Gynecology

## 2019-09-16 DIAGNOSIS — Z1231 Encounter for screening mammogram for malignant neoplasm of breast: Secondary | ICD-10-CM

## 2019-09-16 MED ORDER — RIVAROXABAN 20 MG PO TABS: 20.0000 mg | ORAL_TABLET | Freq: Every day | ORAL | 3 refills | Status: DC

## 2019-09-16 NOTE — Progress Notes (Signed)
Came in for PREP class.Kathleen KitchenMarland KitchenBlood noted to left lower leg during PREP class. She sts she struck it on the seated elliptical peddle. Noted two scrapped areas of broken skin with dried blood. About an inch below is an area of blistering. Pt has edema (baseline) and poor circulation Cleaned gently (skin is fragile) with alcohol pad and gauze. Encouraged a opsite dressing and some neosporin. Advised not to break blister-watch for infection.

## 2019-09-16 NOTE — Progress Notes (Signed)
[facility] PREP Weekly Session   Patient Details  Name: Kathleen [name] MRN: [id number] Date of Birth: [birth year] Age: 64 y.o. PCP: [name], [name], MD  There were no vitals filed for this visit.   [name] - 09/16/19 1600      Weekly Session   Topic Discussed Finding support    Minutes exercised this week 120 minutes    Classes attended to date 77          Fun things since last meeting: movie with a friend Grateful for: walking better, believing in myself Nutrition celebration: better choices, less salt  Barriers/struggles: weight machines    Barnett Hatter 09/16/2019, 4:32 PM

## 2019-09-17 MED ORDER — CEPHALEXIN 500 MG PO CAPS
500.0000 mg | ORAL_CAPSULE | Freq: Three times a day (TID) | ORAL | 0 refills | Status: DC
Start: 2019-09-17 — End: 2019-11-26

## 2019-09-19 LAB — HM DIABETES EYE EXAM

## 2019-09-23 NOTE — Progress Notes (Signed)
[facility] PREP Weekly Session   Patient Details  Name: Kathleen [name] MRN: [id number] Date of Birth: [birth year] Age: 64 y.o. PCP: Darreld Mclean, MD  Vitals:   09/23/19 1547  Weight: (!) 310 lb (140.6 kg)     [name] - 09/23/19 1500      Weekly Session   Topic Discussed Calorie breakdown    Minutes exercised this week 320 minutes    Classes attended to date 51          Fun things since last meeting: birthday supper Grateful for: friends Nutrition celebration: less salt! Still gained weight from the salt Barriers/struggles: cooking for myself, future workouts, need nutrition advice  Talked about plateau of weight loss. May not be eating enough calories for amount of workouts.  Will reach to MD office for referral to dietician-hasn't been back to one in 2 years. May need a refresher.  Commented clothes fitting better  Barnett Hatter 09/23/2019, 3:48 PM

## 2019-09-26 ENCOUNTER — Other Ambulatory Visit: Payer: Self-pay

## 2019-09-26 ENCOUNTER — Ambulatory Visit
Admission: RE | Admit: 2019-09-26 | Discharge: 2019-09-26 | Disposition: A | Discharge status: Home / Self Care | Payer: BC Managed Care – PPO | Source: Ambulatory Visit | Attending: Obstetrics and Gynecology | Admitting: Obstetrics and Gynecology

## 2019-09-26 DIAGNOSIS — Z1231 Encounter for screening mammogram for malignant neoplasm of breast: Secondary | ICD-10-CM

## 2019-09-30 NOTE — Progress Notes (Signed)
[facility] PREP Weekly Session   Patient Details  Name: Kathleen [name] MRN: [id number] Date of Birth: [birth year] Age: 64 y.o. PCP: Darreld Mclean, MD  Vitals:   09/30/19 1559  Weight: (!) 308 lb 9.6 oz (140 kg)     [name] - 09/30/19 1500      Weekly Session   Topic Discussed Hitting roadblocks    Minutes exercised this week 210 minutes    Classes attended to date 51          Fun things since last meeting: lots of birthday stuff Grateful for: life, friends Nutrition celebration: I ate real food and lost weight. I went to the grocery store to buy diabetic friendly food Barriers/struggles: meal planning   Barnett Hatter 09/30/2019, 4:00 PM

## 2019-10-01 ENCOUNTER — Other Ambulatory Visit: Payer: Self-pay | Admitting: Obstetrics and Gynecology

## 2019-10-01 ENCOUNTER — Other Ambulatory Visit: Payer: Self-pay

## 2019-10-01 DIAGNOSIS — R928 Other abnormal and inconclusive findings on diagnostic imaging of breast: Secondary | ICD-10-CM

## 2019-10-06 ENCOUNTER — Encounter: Payer: Self-pay | Admitting: Family Medicine

## 2019-10-07 NOTE — Progress Notes (Signed)
[facility] PREP Weekly Session   Patient Details  Name: Kathleen [name] MRN: [id number] Date of Birth: 08/13/1955 Age: 64 y.o. PCP: Darreld Mclean, MD  Vitals:   10/07/19 1541  Weight: (!) 308 lb (139.7 kg)     [name] - 10/07/19 1500      Weekly Session   Topic Discussed --   resetting goals and survey   Minutes exercised this week 300 minutes    Classes attended to date 60          Fun things since last meeting: movie with a friend Grateful for: friends Nutrition celebration: cooking for myself and grocery shopping Barriers/struggles: meal planning   Barnett Hatter 10/07/2019, 3:41 PM

## 2019-10-08 ENCOUNTER — Ambulatory Visit
Admission: RE | Admit: 2019-10-08 | Discharge: 2019-10-08 | Disposition: A | Discharge status: Home / Self Care | Payer: BC Managed Care – PPO | Source: Ambulatory Visit | Attending: Obstetrics and Gynecology | Admitting: Obstetrics and Gynecology

## 2019-10-08 ENCOUNTER — Ambulatory Visit: Payer: BC Managed Care – PPO

## 2019-10-08 ENCOUNTER — Other Ambulatory Visit: Payer: Self-pay

## 2019-10-08 DIAGNOSIS — R928 Other abnormal and inconclusive findings on diagnostic imaging of breast: Secondary | ICD-10-CM

## 2019-10-13 ENCOUNTER — Other Ambulatory Visit: Payer: Self-pay

## 2019-10-13 ENCOUNTER — Encounter: Payer: Self-pay | Admitting: Family Medicine

## 2019-10-13 ENCOUNTER — Telehealth (INDEPENDENT_AMBULATORY_CARE_PROVIDER_SITE_OTHER): Payer: BC Managed Care – PPO | Admitting: Family Medicine

## 2019-10-13 DIAGNOSIS — R635 Abnormal weight gain: Secondary | ICD-10-CM | POA: Diagnosis not present

## 2019-10-13 DIAGNOSIS — E119 Type 2 diabetes mellitus without complications: Secondary | ICD-10-CM

## 2019-10-13 NOTE — Progress Notes (Signed)
[place] at [facility] [address], [place], [place] [address] [phone]  Date:  10/13/2019   Name:  Kathleen [name]   DOB:  1956/01/16   MRN:  [id number]  PCP:  Kathleen Mclean, MD    Chief Complaint: No chief complaint on file.   History of Present Illness:  Kathleen [name] is a 64 y.o. very pleasant female patient who presents with the following:  Kathleen [name] is my primary care patient, virtual visit today for concern of illness  Patient location is home, provider location is office Patient identity confirmed with 2 factors, she gives consent for virtual visit today.  The patient and myself are present on the call today  Last seen by myself in May History of daibetes, NSTEMI February 2009, CAD, heart failure, A. fib on Xarelto, hypertension, chronic kidney disease, venous stasis bilaterally, bipolar disorder, hyperparathyroidism status post parathyroidectomy 2016 A fib is a new dx- presented to the ER in a fib with RVR in February of this year, was admitted and cardioverted with Cardizem drop, put on xarelto   She has completed her COVID-19 series  She is going to a diet/ [facility] class [facility] and would like to check her A1c to gauge her progress She has not been able to lost much weight which is frustrating to her  She has noted a ST for about 2 weeks- wonders if allergies.  This was not the reason patient scheduled visit today, she is not overly concerned about her mild sore throat No fever No sneezing or runny nose  She notes she is exercising 5x a week for the last 3 months or so  She would like a referral to nutrition and diabetes management center for meal planning   Lab Results  Component Value Date   HGBA1C 6.8 (H) 05/26/2019    Patient Active Problem List   Diagnosis Date Noted  . AKI (acute kidney injury) ([place])   . CAP (community acquired pneumonia) 03/01/2019  . Atrial fibrillation with RVR  ([place]) 02/28/2019  . Chronic kidney disease (CKD), stage III (moderate) 01/14/2019  . Vitamin D deficiency 07/22/2018  . Complete rotator cuff tear or rupture of right shoulder, not specified as traumatic 02/27/2017  . Auditory hallucination 12/13/2016  . Gout 06/29/2016  . Type 2 diabetes mellitus ([place]) 12/31/2015  . Coronary artery dissection   . Lung nodule seen on imaging study 09/24/2014  . Paroxysmal atrial fibrillation ([place]) 09/11/2014  . Asthmatic bronchitis with exacerbation 05/25/2014  . Primary hyperparathyroidism ([place]) 04/15/2014  . Chronic diastolic heart failure ([place]) 10/02/2013  . Shortness of breath 08/31/2013  . Venous stasis dermatitis of both lower extremities 08/19/2013  . Nocturia 06/12/2013  . Morbid obesity ([place]) 08/09/2012  . Urinary incontinence, urge 07/11/2012  . CAD (coronary artery disease) 10/31/2010  . NEOPLASM OF UNCERTAIN BEHAVIOR OF SKIN 02/11/2010  . Bipolar disorder ([place]) 09/14/2009  . ACUT MYOCARD INFARCT UNS SITE SUBSQT EPIS CARE 07/02/2008  . Hyperlipidemia 05/23/2008  . UNSPECIFIED DISORDER OF THYROID 02/04/2008  . HYPERTENSION, BENIGN ESSENTIAL 02/04/2008  . MYOCARDIAL INFARCTION, HX OF 03/05/2007    Past Medical History:  Diagnosis Date  . Anemia   . Anxiety   . Asthma   . Atrial fibrillation ([place])   . Bipolar disorder ([place])   . CAD (coronary artery disease)   . Cancer ([place])    basal cell carcinoma on right hand, papilloma left breast  .  CHF (congestive heart failure) ([place])    pt seen at heart/vascular spec clinic 08/14/2014   . Chronic kidney disease   . Coarse tremors    hands  . Coronary artery disease   . Depression   . DVT (deep vein thrombosis) in pregnancy    pt. reports that it was post knee surgery, not during pregnancy  . Dyslipidemia   . Fall   . Fatty liver   . Headache    migraines - stopped after menopause  . Heart attack ([place]) 02/2007   non-q-wave with second septal perforator  . Hyperlipidemia   .  Hypertension   . Hypothyroidism    history of took medication, has resolved  . Knee pain, left   . Lower extremity deep venous thrombosis (HCC)    20 years ago   . Measles    hx of in childhood   . Morbid obesity with BMI of 40.0-44.9, adult ([place])   . Pain at surgical incision    Left breast from procedure 09/11/16  . Pneumonia    hx of   . PONV (postoperative nausea and vomiting)    also slow to wake up  . Psychiatric hospitalization 05/2008  . Shingles    20 years ago   . Shortness of breath dyspnea    exercise   . Type 2 diabetes mellitus ([place]) 12/31/2015  . Urinary incontinence   . UTI (urinary tract infection)     Past Surgical History:  Procedure Laterality Date  . BASAL CELL CARCINOMA EXCISION    . BREAST EXCISIONAL BIOPSY Left   . BREAST LUMPECTOMY WITH RADIOACTIVE SEED LOCALIZATION Left 09/11/2016   Procedure: LEFT BREAST LUMPECTOMY WITH RADIOACTIVE SEED LOCALIZATION;  Surgeon: Excell Seltzer, MD;  Location: [facility];  Service: General;  Laterality: Left;  . BREAST MASS EXCISION     age 59, benign tumor  . CARDIAC CATHETERIZATION    . CARDIAC CATHETERIZATION N/A 12/30/2015   Procedure: Left Heart Cath and Coronary Angiography;  Surgeon: Belva Crome, MD;  Location: [facility];  Service: Cardiovascular;  Laterality: N/A;  . COLONOSCOPY  07/15/2009   [facility]  . DILATATION & CURETTAGE/HYSTEROSCOPY WITH MYOSURE N/A 09/22/2016   Procedure: DILATATION & CURETTAGE/HYSTEROSCOPY WITH MYOSURE;  Surgeon: Molli Posey, MD;  Location: [facility];  Service: Gynecology;  Laterality: N/A;  . DILATION AND CURETTAGE OF UTERUS    . KNEE SURGERY     right, removed cartilage  . PARATHYROIDECTOMY N/A 08/25/2014   Procedure: PARATHYROIDECTOMY;  Surgeon: Armandina Gemma, MD;  Location: [facility];  Service: General;  Laterality: N/A;  . SHOULDER ARTHROSCOPY WITH ROTATOR CUFF REPAIR AND SUBACROMIAL DECOMPRESSION Right 02/27/2017   Procedure: Right shoulder arthroscopic rotator cuff repair with  biceps tenodesis and subacromial decompression;  Surgeon: Nicholes Stairs, MD;  Location: [facility];  Service: Orthopedics;  Laterality: Right;  150 mins  . TONSILLECTOMY    . TUBAL LIGATION      Social History   Tobacco Use  . Smoking status: Never Smoker  . Smokeless tobacco: Never Used  Vaping Use  . Vaping Use: Never used  Substance Use Topics  . Alcohol use: Yes    Alcohol/week: 0.0 standard drinks    Comment: occ.  . Drug use: No    Family History  Problem Relation Age of Onset  . Breast cancer Mother 62  . Alcohol abuse Mother   . Alzheimer's disease Mother   . Lung cancer Mother   . Alcohol abuse Father   .  Alzheimer's disease Father   . Parkinson's disease Father   . Alcohol abuse Maternal Grandfather   . Drug abuse Maternal Grandfather   . Alcohol abuse Maternal Grandmother   . Alcohol abuse Paternal Grandfather   . Alcohol abuse Paternal Grandmother   . Schizophrenia Cousin   . Diabetes Brother   . Hypertension Brother   . Colon cancer Neg Hx   . Esophageal cancer Neg Hx   . Rectal cancer Neg Hx   . Stomach cancer Neg Hx     Allergies  Allergen Reactions  . Eggs Or Egg-Derived Products Hives and Other (See Comments)    Can eat foods with cooked eggs; CANNOT handle when part of flu vaccine, etc.   . Tetanus Toxoids     Vigorous local reaction to tdap with local pain and itching   . Lamictal [Lamotrigine] Hives and Rash    Medication list has been reviewed and updated.  Current Outpatient Medications on File Prior to Visit  Medication Sig Dispense Refill  . allopurinol (ZYLOPRIM) 100 MG tablet TAKE TWO TABLETS BY MOUTH DAILY 90 tablet 0  . benztropine (COGENTIN) 1 MG tablet Take 1 mg by mouth 2 (two) times daily.  2  . Biotin w/ Vitamins C & E (HAIR/SKIN/NAILS PO) Take by mouth.    . calcitRIOL (ROCALTROL) 0.25 MCG capsule TAKE ONE CAPSULE BY MOUTH DAILY 60 capsule 0  . cephALEXin (KEFLEX) 500 MG capsule Take 1 capsule (500 mg total) by mouth 3  (three) times daily. 30 capsule 0  . divalproex (DEPAKOTE ER) 500 MG 24 hr tablet Take 1,500 mg by mouth at bedtime.     . empagliflozin (JARDIANCE) 10 MG TABS tablet Take 10 mg by mouth daily. 90 tablet 3  . furosemide (LASIX) 80 MG tablet Take 80mg  in the morning and 40mg  in the afternoon 135 tablet 5  . LATUDA 60 MG TABS Take 60 mg by mouth at bedtime.     . levalbuterol (XOPENEX HFA) 45 MCG/ACT inhaler Inhale 2 puffs into the lungs every 4 (four) hours as needed for wheezing. 1 Inhaler 1  . lisinopril (ZESTRIL) 20 MG tablet Take 1 tablet (20 mg total) by mouth daily. 90 tablet 1  . metFORMIN (GLUCOPHAGE) 500 MG tablet Take 500 mg by mouth every evening.    . metoprolol tartrate (LOPRESSOR) 25 MG tablet Take 1 tablet (25 mg total) by mouth 2 (two) times daily. 180 tablet 3  . nitroGLYCERIN (NITROSTAT) 0.4 MG SL tablet Place 1 tablet (0.4 mg total) under the tongue every 5 (five) minutes as needed for chest pain. For chest pain 25 tablet 3  . OLANZapine (ZYPREXA) 15 MG tablet Take 15 mg by mouth at bedtime.    . rivaroxaban (XARELTO) 20 MG TABS tablet Take 1 tablet (20 mg total) by mouth at bedtime. 90 tablet 3  . rosuvastatin (CRESTOR) 20 MG tablet Take 1 tablet (20 mg total) by mouth daily. 90 tablet 3  . UNABLE TO FIND Med Name: One Touch Verio blood glucose meter Lot #[id number] x Exp 01/22/2018     No current facility-administered medications on file prior to visit.    Review of Systems:  As per HPI- otherwise negative.   Physical Examination: There were no vitals filed for this visit. There were no vitals filed for this visit. There is no height or weight on file to calculate BMI. Ideal Body Weight:    Connected with pt via video monitor She looks well, her normal self.  No cough,  wheezing, shortness of breath is noted.  She is not regularly  checking her pulse or blood pressure at home   BP Readings from Last 3 Encounters:  07/15/19 132/80  06/26/19 124/68  05/26/19 104/71      Assessment and Plan: Type 2 diabetes mellitus without complication, unspecified whether long term insulin use ([place]) - Plan: Hemoglobin A1c, Amb ref to Medical Nutrition Therapy-MNT  Weight gain - Plan: TSH  Virtual visit today to discuss patient concerned about response to exercise.  She has been doing a very good job exercising the last several months, is frustrated she has failed to lose significant weight.  She would like to check her A1c in hopes of seeing an improvement. Her diabetes is currently treated with Metformin and Jardiance Ordered A1c, also TSH level for her.  We will get her set up for a lab visit only. Patient feels that her mild sore throat is due to allergies.  We discussed some over-the-counter remedies which may be helpful, she will let me know if any other concerns about this issue  Will plan further follow- up pending labs. Video used for entirety of visit today  Signed Lamar Blinks, MD

## 2019-10-14 ENCOUNTER — Other Ambulatory Visit: Payer: Self-pay

## 2019-10-14 ENCOUNTER — Other Ambulatory Visit: Payer: BC Managed Care – PPO

## 2019-10-14 DIAGNOSIS — E119 Type 2 diabetes mellitus without complications: Secondary | ICD-10-CM

## 2019-10-14 DIAGNOSIS — R635 Abnormal weight gain: Secondary | ICD-10-CM

## 2019-10-14 NOTE — Progress Notes (Signed)
[facility] Report   Patient Details  Name: Kathleen [name] MRN: [id number] Date of Birth: September 19, 1955 Age: 64 y.o. PCP: Darreld Mclean, MD  Vitals:   10/14/19 1535  BP: 110/60  Pulse: 72  SpO2: 97%  Weight: (!) 308 lb (139.7 kg)  Height: 6' (1.829 m)      [facility] Eval - 10/14/19 1500      Referral    Referring Provider [place]      Measurement   Waist Circumference 56.5 inches    Hip Circumference 62 inches    Body fat 49 percent      Information for Trainer   Goals --   150 min per week cardio, strength train 3x week 30 min     Mobility and Daily Activities   I find it easy to walk up or down two or more flights of stairs. 2    I have no trouble taking out the trash. 1    I do housework such as vacuuming and dusting on my own without difficulty. 2    I can easily lift a gallon of milk (8lbs). 4    I can easily walk a mile. 3    I have no trouble reaching into high cupboards or reaching down to pick up something from the floor. 3    I do not have trouble doing out-door work such as Armed forces logistics/support/administrative officer, raking leaves, or gardening. 1      Mobility and Daily Activities   I feel younger than my age. 2    I feel independent. 3    I feel energetic. 3    I live an active life.  1    I feel strong. 2    I feel healthy. 2    I feel active as other people my age. 2      How fit and strong are you.   Fit and Strong Total Score 31          Past Medical History:  Diagnosis Date  . Anemia   . Anxiety   . Asthma   . Atrial fibrillation ([place])   . Bipolar disorder ([place])   . CAD (coronary artery disease)   . Cancer ([place])    basal cell carcinoma on right hand, papilloma left breast  . CHF (congestive heart failure) ([place])    pt seen at heart/vascular spec clinic 08/14/2014   . Chronic kidney disease   . Coarse tremors    hands  . Coronary artery disease   . Depression   . DVT (deep vein thrombosis) in pregnancy    pt. reports that it was post  knee surgery, not during pregnancy  . Dyslipidemia   . Fall   . Fatty liver   . Headache    migraines - stopped after menopause  . Heart attack ([place]) 02/2007   non-q-wave with second septal perforator  . Hyperlipidemia   . Hypertension   . Hypothyroidism    history of took medication, has resolved  . Knee pain, left   . Lower extremity deep venous thrombosis (HCC)    20 years ago   . Measles    hx of in childhood   . Morbid obesity with BMI of 40.0-44.9, adult ([place])   . Pain at surgical incision    Left breast from procedure 09/11/16  . Pneumonia    hx of   . PONV (postoperative nausea and vomiting)    also slow to  wake up  . Psychiatric hospitalization 05/2008  . Shingles    20 years ago   . Shortness of breath dyspnea    exercise   . Type 2 diabetes mellitus ([place]) 12/31/2015  . Urinary incontinence   . UTI (urinary tract infection)    Past Surgical History:  Procedure Laterality Date  . BASAL CELL CARCINOMA EXCISION    . BREAST EXCISIONAL BIOPSY Left   . BREAST LUMPECTOMY WITH RADIOACTIVE SEED LOCALIZATION Left 09/11/2016   Procedure: LEFT BREAST LUMPECTOMY WITH RADIOACTIVE SEED LOCALIZATION;  Surgeon: Excell Seltzer, MD;  Location: [facility];  Service: General;  Laterality: Left;  . BREAST MASS EXCISION     age 35, benign tumor  . CARDIAC CATHETERIZATION    . CARDIAC CATHETERIZATION N/A 12/30/2015   Procedure: Left Heart Cath and Coronary Angiography;  Surgeon: Belva Crome, MD;  Location: [facility];  Service: Cardiovascular;  Laterality: N/A;  . COLONOSCOPY  07/15/2009   [facility]  . DILATATION & CURETTAGE/HYSTEROSCOPY WITH MYOSURE N/A 09/22/2016   Procedure: DILATATION & CURETTAGE/HYSTEROSCOPY WITH MYOSURE;  Surgeon: Molli Posey, MD;  Location: [facility];  Service: Gynecology;  Laterality: N/A;  . DILATION AND CURETTAGE OF UTERUS    . KNEE SURGERY     right, removed cartilage  . PARATHYROIDECTOMY N/A 08/25/2014   Procedure: PARATHYROIDECTOMY;  Surgeon: Armandina Gemma, MD;  [facility];  Service: General;  Laterality: N/A;  . SHOULDER ARTHROSCOPY WITH ROTATOR CUFF REPAIR AND SUBACROMIAL DECOMPRESSION Right 02/27/2017   Procedure: Right shoulder arthroscopic rotator cuff repair with biceps tenodesis and subacromial decompression;  Surgeon: Nicholes Stairs, MD;  Location: [facility];  Service: Orthopedics;  Laterality: Right;  150 mins  . TONSILLECTOMY    . TUBAL LIGATION     Social History   Tobacco Use  Smoking Status Never Smoker  Smokeless Tobacco Never Used    How fit and strong you are score: improved from 16-31 Marked improvement in march test and ability to sit and stand Walking without cane at end of program  Due to have A1C rechecked talked to provider about Thyroid studies, drawn today. Will phone back with results.  Will also be following up with dietician.  Encouraged more strength training and more vegetables.     Barnett Hatter 10/14/2019, 3:38 PM

## 2019-10-15 ENCOUNTER — Ambulatory Visit (HOSPITAL_COMMUNITY)
Admission: RE | Admit: 2019-10-15 | Discharge: 2019-10-15 | Disposition: A | Discharge status: Home / Self Care | Payer: BC Managed Care – PPO | Source: Ambulatory Visit | Attending: Internal Medicine | Admitting: Internal Medicine

## 2019-10-15 ENCOUNTER — Other Ambulatory Visit: Payer: Self-pay | Admitting: Family Medicine

## 2019-10-15 ENCOUNTER — Encounter (HOSPITAL_COMMUNITY): Payer: Self-pay | Admitting: Internal Medicine

## 2019-10-15 ENCOUNTER — Encounter: Payer: Self-pay | Admitting: Family Medicine

## 2019-10-15 VITALS — BP 112/66 | HR 86 | Ht 72.0 in | Wt 309.4 lb

## 2019-10-15 DIAGNOSIS — Z79899 Other long term (current) drug therapy: Secondary | ICD-10-CM | POA: Insufficient documentation

## 2019-10-15 DIAGNOSIS — I5032 Chronic diastolic (congestive) heart failure: Secondary | ICD-10-CM | POA: Diagnosis not present

## 2019-10-15 DIAGNOSIS — I130 Hypertensive heart and chronic kidney disease with heart failure and stage 1 through stage 4 chronic kidney disease, or unspecified chronic kidney disease: Secondary | ICD-10-CM | POA: Insufficient documentation

## 2019-10-15 DIAGNOSIS — Z87440 Personal history of urinary (tract) infections: Secondary | ICD-10-CM | POA: Insufficient documentation

## 2019-10-15 DIAGNOSIS — E118 Type 2 diabetes mellitus with unspecified complications: Secondary | ICD-10-CM | POA: Diagnosis not present

## 2019-10-15 DIAGNOSIS — Z86718 Personal history of other venous thrombosis and embolism: Secondary | ICD-10-CM | POA: Diagnosis not present

## 2019-10-15 DIAGNOSIS — I10 Essential (primary) hypertension: Secondary | ICD-10-CM

## 2019-10-15 DIAGNOSIS — K760 Fatty (change of) liver, not elsewhere classified: Secondary | ICD-10-CM | POA: Insufficient documentation

## 2019-10-15 DIAGNOSIS — Z713 Dietary counseling and surveillance: Secondary | ICD-10-CM | POA: Insufficient documentation

## 2019-10-15 DIAGNOSIS — Z6841 Body Mass Index (BMI) 40.0 and over, adult: Secondary | ICD-10-CM | POA: Insufficient documentation

## 2019-10-15 DIAGNOSIS — F319 Bipolar disorder, unspecified: Secondary | ICD-10-CM | POA: Insufficient documentation

## 2019-10-15 DIAGNOSIS — N189 Chronic kidney disease, unspecified: Secondary | ICD-10-CM | POA: Insufficient documentation

## 2019-10-15 DIAGNOSIS — I480 Paroxysmal atrial fibrillation: Secondary | ICD-10-CM

## 2019-10-15 DIAGNOSIS — E785 Hyperlipidemia, unspecified: Secondary | ICD-10-CM | POA: Insufficient documentation

## 2019-10-15 DIAGNOSIS — I252 Old myocardial infarction: Secondary | ICD-10-CM | POA: Diagnosis not present

## 2019-10-15 DIAGNOSIS — Z7901 Long term (current) use of anticoagulants: Secondary | ICD-10-CM | POA: Diagnosis not present

## 2019-10-15 DIAGNOSIS — E1122 Type 2 diabetes mellitus with diabetic chronic kidney disease: Secondary | ICD-10-CM | POA: Insufficient documentation

## 2019-10-15 DIAGNOSIS — E039 Hypothyroidism, unspecified: Secondary | ICD-10-CM | POA: Diagnosis not present

## 2019-10-15 DIAGNOSIS — R7989 Other specified abnormal findings of blood chemistry: Secondary | ICD-10-CM

## 2019-10-15 DIAGNOSIS — I2510 Atherosclerotic heart disease of native coronary artery without angina pectoris: Secondary | ICD-10-CM | POA: Diagnosis not present

## 2019-10-15 DIAGNOSIS — Z8249 Family history of ischemic heart disease and other diseases of the circulatory system: Secondary | ICD-10-CM | POA: Insufficient documentation

## 2019-10-15 DIAGNOSIS — Z7984 Long term (current) use of oral hypoglycemic drugs: Secondary | ICD-10-CM | POA: Diagnosis not present

## 2019-10-15 DIAGNOSIS — Z79890 Hormone replacement therapy (postmenopausal): Secondary | ICD-10-CM | POA: Diagnosis not present

## 2019-10-15 LAB — COMPREHENSIVE METABOLIC PANEL
ALT: 18 U/L (ref 0–44)
AST: 18 U/L (ref 15–41)
Albumin: 3.8 g/dL (ref 3.5–5.0)
Alkaline Phosphatase: 93 U/L (ref 38–126)
Anion gap: 14 (ref 5–15)
BUN: 43 mg/dL — ABNORMAL HIGH (ref 8–23)
CO2: 25 mmol/L (ref 22–32)
Calcium: 9.4 mg/dL (ref 8.9–10.3)
Chloride: 103 mmol/L (ref 98–111)
Creatinine, Ser: 1.53 mg/dL — ABNORMAL HIGH (ref 0.44–1.00)
GFR calc Af Amer: 41 mL/min — ABNORMAL LOW (ref >60)
GFR calc non Af Amer: 36 mL/min — ABNORMAL LOW (ref >60)
Glucose, Bld: 103 mg/dL — ABNORMAL HIGH (ref 70–99)
Potassium: 4.6 mmol/L (ref 3.5–5.1)
Sodium: 142 mmol/L (ref 135–145)
Total Bilirubin: 0.6 mg/dL (ref 0.3–1.2)
Total Protein: 6.5 g/dL (ref 6.5–8.1)

## 2019-10-15 LAB — HEMOGLOBIN A1C
Hgb A1c MFr Bld: 6.5 % of total Hgb — ABNORMAL HIGH (ref <5.7)
Mean Plasma Glucose: 140 (calc)
eAG (mmol/L): 7.7 (calc)

## 2019-10-15 LAB — CBC
HCT: 45.8 % (ref 36.0–46.0)
Hemoglobin: 14.7 g/dL (ref 12.0–15.0)
MCH: 29.3 pg (ref 26.0–34.0)
MCHC: 32.1 g/dL (ref 30.0–36.0)
MCV: 91.4 fL (ref 80.0–100.0)
Platelets: 112 10*3/uL — ABNORMAL LOW (ref 150–400)
RBC: 5.01 MIL/uL (ref 3.87–5.11)
RDW: 14.8 % (ref 11.5–15.5)
WBC: 4.6 10*3/uL (ref 4.0–10.5)
nRBC: 0 % (ref 0.0–0.2)

## 2019-10-15 LAB — TSH: TSH: 5.58 mIU/L — ABNORMAL HIGH (ref 0.40–4.50)

## 2019-10-15 MED ORDER — LEVOTHYROXINE SODIUM 50 MCG PO TABS: 50.0000 ug | ORAL_TABLET | Freq: Every day | ORAL | 3 refills | Status: DC

## 2019-10-15 NOTE — Progress Notes (Signed)
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 Sleep study 11/2013 was negative. Also has history of A fib.   Had NSTEMI in 2/09 had cath at that time with occlusion of septal perforator. Otherwise normal arteries. Was admitted for CP in 11/13. CE normal. Underwent dobutamine stress echo which was normal.   Admitted 09/11/14 for Afib RVR. She converted to sinus tach with dilt drip and was transitioned to Toprol-XL 25 mg daily. She was in NSR at the time of discharge. Discharge weight was 325 lb.  Admitted 7/12-7/14/17 with chest pain. CTA negative for PE. Troponins negative. Had Myoview 08/05/15 with "subtle" anterior wall defect.Felt tobe breast attenuation. Echo stable as below.   Holter monitor in 7/19: NSR with occasional PVCs and PACs. No other abnormalities. Multiple diary events which occasionally correspond to PVCs but often during NSR.  She was recently admitted 2/21 for atrial fibrillation w/ RVR in the setting of CAP and also w/ a/c diastolic HF, requiring IV diuretics. She was admitted by [facility] and general cardiology consulted. Echo repeated and EF 60-65%. RV ok. PNA treated w/ abx. AFib w/ Cardizem and metoprolol w/ spontaneous conversion back to NSR. She diuresed well w/ IV Lasix and transitioned back to PO Lasix. Hospital course also notable for AKI. SCr rose to 2.09 during admit but improved down to 1.46 on d/c. D/c wt 330 lb.  Today she returns for HF follow up. Has been exercising 5x/week with the Prep program.  Feels much better. Denies SOB or CP. Edema better controlled. Watching salt intake and carbs.    Past Medical History:  Diagnosis Date   Anemia    Anxiety    Asthma    Atrial fibrillation ([place])    Bipolar disorder ([place])    CAD (coronary artery disease)    Cancer (HCC)    basal cell carcinoma on right hand, papilloma left breast   CHF (congestive heart failure) ([place])    pt seen at heart/vascular spec clinic 08/14/2014    Chronic kidney disease    Coarse tremors    hands   Coronary artery disease    Depression    DVT (deep vein thrombosis) in pregnancy    pt. 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[place]) 02/2007   non-q-wave with second septal perforator   Hyperlipidemia    Hypertension    Hypothyroidism    history of took medication, has resolved   Knee pain, left    Lower extremity deep venous thrombosis (HCC)    20 years ago    Measles    hx of in childhood    Morbid obesity with BMI of 40.0-44.9, adult ([place])    Pain at surgical incision    Left breast from procedure 09/11/16   Pneumonia    hx of    PONV (postoperative nausea  and vomiting)    also slow to wake up   Psychiatric hospitalization 05/2008   Shingles    20 years ago    Shortness of breath dyspnea    exercise    Type 2 diabetes mellitus ([place]) 12/31/2015   Urinary incontinence    UTI (urinary tract infection)    Past Surgical History:  Procedure Laterality Date   BASAL CELL CARCINOMA EXCISION     BREAST EXCISIONAL BIOPSY Left    BREAST LUMPECTOMY WITH RADIOACTIVE SEED LOCALIZATION Left 09/11/2016   Procedure: LEFT BREAST LUMPECTOMY WITH RADIOACTIVE SEED LOCALIZATION;  Surgeon: Excell Seltzer, MD;  Location: [facility];  Service: General;  Laterality: Left;   BREAST MASS EXCISION     age 14, benign tumor   CARDIAC CATHETERIZATION      CARDIAC CATHETERIZATION N/A 12/30/2015   Procedure: Left Heart Cath and Coronary Angiography;  Surgeon: Belva Crome, MD;  Location: [facility];  Service: Cardiovascular;  Laterality: N/A;   COLONOSCOPY  07/15/2009   [facility]   DILATATION & CURETTAGE/HYSTEROSCOPY WITH MYOSURE N/A 09/22/2016   Procedure: DILATATION & CURETTAGE/HYSTEROSCOPY WITH MYOSURE;  Surgeon: Molli Posey, MD;  Location: [facility];  Service: Gynecology;  Laterality: N/A;   DILATION AND CURETTAGE OF UTERUS     KNEE SURGERY     right, removed cartilage   PARATHYROIDECTOMY N/A 08/25/2014   Procedure: PARATHYROIDECTOMY;  Surgeon: Armandina Gemma, MD;  Location: [facility];  Service: General;  Laterality: N/A;   SHOULDER ARTHROSCOPY WITH ROTATOR CUFF REPAIR AND SUBACROMIAL DECOMPRESSION Right 02/27/2017   Procedure: Right shoulder arthroscopic rotator cuff repair with biceps tenodesis and subacromial decompression;  Surgeon: Nicholes Stairs, MD;  Location: [facility];  Service: Orthopedics;  Laterality: Right;  150 mins   TONSILLECTOMY     TUBAL LIGATION       Current Outpatient Medications  Medication Sig Dispense Refill   allopurinol (ZYLOPRIM) 100 MG tablet TAKE TWO TABLETS BY MOUTH DAILY 90 tablet 0   benztropine (COGENTIN) 1 MG tablet Take 1 mg by mouth 2 (two) times daily.  2   Biotin w/ Vitamins C & E (HAIR/SKIN/NAILS PO) Take by mouth.     calcitRIOL (ROCALTROL) 0.25 MCG capsule TAKE ONE CAPSULE BY MOUTH DAILY 60 capsule 0   cephALEXin (KEFLEX) 500 MG capsule Take 1 capsule (500 mg total) by mouth 3 (three) times daily. 30 capsule 0   divalproex (DEPAKOTE ER) 500 MG 24 hr tablet Take 1,500 mg by mouth at bedtime.      empagliflozin (JARDIANCE) 10 MG TABS tablet Take 10 mg by mouth daily. 90 tablet 3   furosemide (LASIX) 80 MG tablet Take 80mg  in the morning and 40mg  in the afternoon 135 tablet 5   LATUDA 60 MG TABS Take 60 mg by mouth at bedtime.      levalbuterol (XOPENEX HFA) 45 MCG/ACT inhaler Inhale  2 puffs into the lungs every 4 (four) hours as needed for wheezing. 1 Inhaler 1   levothyroxine (SYNTHROID) 50 MCG tablet Take 1 tablet (50 mcg total) by mouth daily. 30 tablet 3   lisinopril (ZESTRIL) 20 MG tablet Take 1 tablet (20 mg total) by mouth daily. 90 tablet 1   metFORMIN (GLUCOPHAGE) 500 MG tablet Take 500 mg by mouth every evening.     metoprolol tartrate (LOPRESSOR) 25 MG tablet Take 1 tablet (25 mg total) by mouth 2 (two) times daily. 180 tablet 3   nitroGLYCERIN (NITROSTAT) 0.4 MG SL tablet Place 1 tablet (0.4 mg total) under  the tongue every 5 (five) minutes as needed for chest pain. For chest pain 25 tablet 3   OLANZapine (ZYPREXA) 15 MG tablet Take 15 mg by mouth at bedtime.     rivaroxaban (XARELTO) 20 MG TABS tablet Take 1 tablet (20 mg total) by mouth at bedtime. 90 tablet 3   rosuvastatin (CRESTOR) 20 MG tablet Take 1 tablet (20 mg total) by mouth daily. 90 tablet 3   UNABLE TO FIND Med Name: One Touch Verio blood glucose meter Lot #[id number] x Exp 01/22/2018     No current facility-administered medications for this encounter.    Allergies:   Eggs or egg-derived products, Tetanus toxoids, and Lamictal [lamotrigine]   Social History:  The patient  reports that she has never smoked. She has never used smokeless tobacco. She reports current alcohol use. She reports that she does not use drugs.   Family History:  The patient's family history includes Alcohol abuse in her father, maternal grandfather, maternal grandmother, mother, paternal grandfather, and paternal grandmother; Alzheimer's disease in her father and mother; Breast cancer (age of onset: 59) in her mother; Diabetes in her brother; Drug abuse in her maternal grandfather; Hypertension in her brother; Lung cancer in her mother; Parkinson's disease in her father; Schizophrenia in her cousin.   ROS:  Please see the history of present illness.   All other systems are personally reviewed and negative.  Vitals:    10/15/19 1427  BP: 112/66  Pulse: 86  SpO2: 98%   Wt Readings from Last 3 Encounters:  10/15/19 (!) 140.3 kg (309 lb 6.4 oz)  10/14/19 (!) 139.7 kg (308 lb)  10/07/19 (!) 139.7 kg (308 lb)    General:  Well appearing. No resp difficulty HEENT: normal Neck: supple. no JVD. Carotids 2+ bilat; no bruits. No lymphadenopathy or thryomegaly appreciated. Cor: PMI nondisplaced. Regular rate & rhythm. No rubs, gallops or murmurs. Lungs: clear Abdomen: obese soft, nontender, nondistended. No hepatosplenomegaly. No bruits or masses. Good bowel sounds. Extremities: no cyanosis, clubbing, rash, edema Neuro: alert & orientedx3, cranial nerves grossly intact. moves all 4 extremities w/o difficulty. Affect pleasant   Recent Labs: 03/02/2019: Magnesium 2.3 03/07/2019: Hemoglobin 14.7; Platelets 117.0 03/25/2019: B Natriuretic Peptide 46.0 05/26/2019: ALT 13; BUN 31; Creatinine, Ser 1.36; Potassium 4.1; Sodium 141 10/14/2019: TSH 5.58  Personally reviewed   Wt Readings from Last 3 Encounters:  10/15/19 (!) 140.3 kg (309 lb 6.4 oz)  10/14/19 (!) 139.7 kg (308 lb)  10/07/19 (!) 139.7 kg (308 lb)      ASSESSMENT AND PLAN:  1. Chronic diastolic HF:  -ECHO 08/6765 EF Normal EF 55-60% Grade IDD  -Echo 7/19 EF 55-60%  -Echo 2/21 EF 60-65%, RV ok  - Much improved with Prep program!! NYHA II. Urged her to continue with exercise - continue Lasix 80 mg qam/40 mg qpm - continue Jardiance 10 mg daily  - continue metoprolol 25 mg bid  - Labs today  2. PAF - Regular on exam.  -  Continue metoprolol for rate control   - CHADSVASC = 3. - Continue Xarelto. No bleeding   3. Morbid obesity: -Body mass index is 41.96 kg/m. - Discussed portion control - Much improved with Prep program!! Urged her to continue with exercise  4. CAD:  - No s/s ischemia - no ASA due to Xarelto - continue statin   5. HTN:  -Blood pressure well controlled. Continue current regimen.  6. Snoring:  - No OSA on  previous sleep study.   7. DM2 -  ContinueJardiance 10 mg daily - Continue metformin  - HgbA1c down to 6.5    Signed, Glori Bickers, MD  10/15/2019 2:35 PM  [facility] [address] [facility] [place] [address] [phone] (office) [phone] (fax)

## 2019-10-15 NOTE — Patient Instructions (Signed)
Labs done today, your results will be available in MyChart, we will contact you for abnormal readings.  Please follow up with our APP clinic in 2 months  If you have any questions or concerns before your next appointment please send [place] a message through [place] or call our office at [phone].    TO LEAVE A MESSAGE FOR THE NURSE SELECT OPTION 2, PLEASE LEAVE A MESSAGE INCLUDING: . YOUR NAME . DATE OF BIRTH . CALL BACK NUMBER . REASON FOR CALL**this is important as we prioritize the call backs  [facility] AS LONG AS YOU CALL BEFORE 4:00 PM  At the [facility], you and your health needs are our priority. As part of our continuing mission to provide you with exceptional heart care, we have created designated Provider Care Teams. These Care Teams include your primary Cardiologist (physician) and Advanced Practice Providers (APPs- Physician Assistants and Nurse Practitioners) who all work together to provide you with the care you need, when you need it.   You may see any of the following providers on your designated Care Team at your next follow up: [name] Dr Glori Bickers . Dr Loralie Champagne . Darrick Grinder, NP . Lyda Jester, PA . Audry Riles, PharmD   Please be sure to bring in all your medications bottles to every appointment.

## 2019-11-05 ENCOUNTER — Other Ambulatory Visit: Payer: Self-pay | Admitting: Family Medicine

## 2019-11-10 ENCOUNTER — Ambulatory Visit: Payer: BC Managed Care – PPO | Admitting: Skilled Nursing Facility1

## 2019-11-22 NOTE — Patient Instructions (Addendum)
Great to see you again today! I will be in touch with your thyroid level and we can adjust your medication if needed Please stop by the ground floor imaging dept today and see about getting a bone density scan  For your wrist/ hand symptoms, please try wearing wrist splints that prevent flexion of the wrist joint at night for 2-3 weeks.  Let me know if not helpful or if getting worse  BP and weight look terrific!    Carpal Tunnel Syndrome  Carpal tunnel syndrome is a condition that causes pain in your hand and arm. The carpal tunnel is a narrow area located on the palm side of your wrist. Repeated wrist motion or certain diseases may cause swelling within the tunnel. This swelling pinches the main nerve in the wrist (median nerve). What are the causes? This condition may be caused by:  Repeated wrist motions.  Wrist injuries.  Arthritis.  A cyst or tumor in the carpal tunnel.  Fluid buildup during pregnancy. Sometimes the cause of this condition is not known. What increases the risk? The following factors may make you more likely to develop this condition:  Having a job, such as being a Research scientist (life sciences), that requires you to repeatedly move your wrist in the same motion.  Being a woman.  Having certain conditions, such as: ? Diabetes. ? Obesity. ? An underactive thyroid (hypothyroidism). ? Kidney failure. What are the signs or symptoms? Symptoms of this condition include:  A tingling feeling in your fingers, especially in your thumb, index, and middle fingers.  Tingling or numbness in your hand.  An aching feeling in your entire arm, especially when your wrist and elbow are bent for a long time.  Wrist pain that goes up your arm to your shoulder.  Pain that goes down into your palm or fingers.  A weak feeling in your hands. You may have trouble grabbing and holding items. Your symptoms may feel worse during the night. How is this diagnosed? This condition is  diagnosed with a medical history and physical exam. You may also have tests, including:  Electromyogram (EMG). This test measures electrical signals sent by your nerves into the muscles.  Nerve conduction study. This test measures how well electrical signals pass through your nerves.  Imaging tests, such as X-rays, ultrasound, and MRI. These tests check for possible causes of your condition. How is this treated? This condition may be treated with:  Lifestyle changes. It is important to stop or change the activity that caused your condition.  Doing exercise and activities to strengthen your muscles and bones (physical therapy).  Learning how to use your hand again after diagnosis (occupational therapy).  Medicines for pain and inflammation. This may include medicine that is injected into your wrist.  A wrist splint.  Surgery. Follow these instructions at home: If you have a splint:  Wear the splint as told by your health care provider. Remove it only as told by your health care provider.  Loosen the splint if your fingers tingle, become numb, or turn cold and blue.  Keep the splint clean.  If the splint is not waterproof: ? Do not let it get wet. ? Cover it with a watertight covering when you take a bath or shower. Managing pain, stiffness, and swelling   If directed, put ice on the painful area: ? If you have a removable splint, remove it as told by your health care provider. ? Put ice in a plastic bag. ?  Place a towel between your skin and the bag. ? Leave the ice on for 20 minutes, 2-3 times per day. General instructions  Take over-the-counter and prescription medicines only as told by your health care provider.  Rest your wrist from any activity that may be causing your pain. If your condition is work related, talk with your employer about changes that can be made, such as getting a wrist pad to use while typing.  Do any exercises as told by your health care  provider, physical therapist, or occupational therapist.  Keep all follow-up visits as told by your health care provider. This is important. Contact a health care provider if:  You have new symptoms.  Your pain is not controlled with medicines.  Your symptoms get worse. Get help right away if:  You have severe numbness or tingling in your wrist or hand. Summary  Carpal tunnel syndrome is a condition that causes pain in your hand and arm.  It is usually caused by repeated wrist motions.  Lifestyle changes and medicines are used to treat carpal tunnel syndrome. Surgery may be recommended.  Follow your health care provider's instructions about wearing a splint, resting from activity, keeping follow-up visits, and calling for help. This information is not intended to replace advice given to you by your health care provider. Make sure you discuss any questions you have with your health care provider. Document Revised: 05/18/2017 Document Reviewed: 05/18/2017 Elsevier Patient Education  [place].

## 2019-11-22 NOTE — Progress Notes (Addendum)
[facility] [address], [place], [place] [address] [phone]  Date:  11/26/2019   Name:  Kathleen [name]   DOB:  [birth year]   MRN:  [id number]  PCP:  Darreld Mclean, MD    Chief Complaint: Diabetes   History of Present Illness:  Kathleen [name] is a 64 y.o. 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 Sleep study 11/2013 was negative. Also has history of A fib, bipolar disorder  Last seen by myself virtually in September  At that time she has stared a regular exercise program but had not yet lost much weight which was frustrating to her  TSH at that time slightly elevated at 5.58- I started her on synthroid 50 and we will recheck TSH today  A1c showed excellent control at 6.5% She does also have history of primary hyperparathyroidism status post parathyroidectomy about 5 years ago Her most recent PTH level in May was elevated-this was felt due to to chronic renal disease, we started her on calcitriol 0.25 mcg daily to control calcium level Most recent calcium in September 9 0.4  Visit with advanced CHF clinic on 9/22:  ASSESSMENT AND PLAN: 1. Chronic diastolic HF:  -ECHO 06/7207 EF Normal EF 55-60% Grade IDD  -Echo 7/19 EF 55-60% -Echo 2/21 EF 60-65%, RV ok - Much improved with Prep program!! NYHA II. Urged her to continue with exercise - continue Lasix 80 mg qam/40 mg qpm - continue Jardiance 10 mg daily  - continue metoprolol 25 mg bid  - Labs today 2. PAF - Regular on exam.  -  Continue metoprolol for rate control - CHADSVASC = 3. - ContinueXarelto. No bleeding  3. Morbid obesity: -Body mass index is 41.96 kg/m. - Discussed portion control - Much improved  with Prep program!! Urged her to continue with exercise 4. CAD:  - No s/s ischemia -no ASA due to Xarelto - continue statin 5. HTN: -Blood pressure well controlled. Continue current regimen. 6. Snoring:  - No OSA onprevioussleep study.  7. DM2 -ContinueJardiance 10 mg daily - Continue metformin  - HgbA1c down to 6.5  Flu vaccine- done  covid booster- done  shingrix is done   Wt Readings from Last 3 Encounters:  11/26/19 (!) 304 lb (137.9 kg)  10/15/19 (!) 309 lb 6.4 oz (140.3 kg)  10/14/19 (!) 308 lb (139.7 kg)     Lab Results  Component Value Date   HGBA1C 6.5 (H) 10/14/2019   She has noted tingling in her bilateral hands for about one month- more at night It is more in the index, long and ing fingers She does not do a lot of computer work No neck pain No night time pain  Patient Active Problem List   Diagnosis Date Noted  . AKI (acute kidney injury) ([place])   . CAP (community acquired pneumonia) 03/01/2019  . Atrial fibrillation with RVR ([place]) 02/28/2019  . Chronic kidney disease (CKD), stage III (moderate) ([place]) 01/14/2019  . Vitamin D deficiency 07/22/2018  . Complete rotator cuff tear or rupture of right shoulder, not specified as traumatic 02/27/2017  . Auditory hallucination 12/13/2016  . Gout 06/29/2016  . Type 2 diabetes mellitus ([place]) 12/31/2015  . Coronary artery dissection   .  Lung nodule seen on imaging study 09/24/2014  . Paroxysmal atrial fibrillation ([place]) 09/11/2014  . Asthmatic bronchitis with exacerbation 05/25/2014  . Primary hyperparathyroidism ([place]) 04/15/2014  . Chronic diastolic heart failure ([place]) 10/02/2013  . Shortness of breath 08/31/2013  . Venous stasis dermatitis of both lower extremities 08/19/2013  . Nocturia 06/12/2013  . Morbid obesity ([place]) 08/09/2012  . Urinary incontinence, urge 07/11/2012  . CAD (coronary artery disease) 10/31/2010  . NEOPLASM OF UNCERTAIN BEHAVIOR OF SKIN 02/11/2010  . Bipolar disorder ([place])  09/14/2009  . ACUT MYOCARD INFARCT UNS SITE SUBSQT EPIS CARE 07/02/2008  . Hyperlipidemia 05/23/2008  . UNSPECIFIED DISORDER OF THYROID 02/04/2008  . HYPERTENSION, BENIGN ESSENTIAL 02/04/2008  . MYOCARDIAL INFARCTION, HX OF 03/05/2007    Past Medical History:  Diagnosis Date  . Anemia   . Anxiety   . Asthma   . Atrial fibrillation ([place])   . Bipolar disorder ([place])   . CAD (coronary artery disease)   . Cancer ([place])    basal cell carcinoma on right hand, papilloma left breast  . CHF (congestive heart failure) ([place])    pt seen at heart/vascular spec clinic 08/14/2014   . Chronic kidney disease   . Coarse tremors    hands  . Coronary artery disease   . Depression   . DVT (deep vein thrombosis) in pregnancy    pt. reports that it was post knee surgery, not during pregnancy  . Dyslipidemia   . Fall   . Fatty liver   . Headache    migraines - stopped after menopause  . Heart attack ([place]) 02/2007   non-q-wave with second septal perforator  . Hyperlipidemia   . Hypertension   . Hypothyroidism    history of took medication, has resolved  . Knee pain, left   . Lower extremity deep venous thrombosis (HCC)    20 years ago   . Measles    hx of in childhood   . Morbid obesity with BMI of 40.0-44.9, adult ([place])   . Pain at surgical incision    Left breast from procedure 09/11/16  . Pneumonia    hx of   . PONV (postoperative nausea and vomiting)    also slow to wake up  . Psychiatric hospitalization 05/2008  . Shingles    20 years ago   . Shortness of breath dyspnea    exercise   . Type 2 diabetes mellitus ([place]) 12/31/2015  . Urinary incontinence   . UTI (urinary tract infection)     Past Surgical History:  Procedure Laterality Date  . BASAL CELL CARCINOMA EXCISION    . BREAST EXCISIONAL BIOPSY Left   . BREAST LUMPECTOMY WITH RADIOACTIVE SEED LOCALIZATION Left 09/11/2016   Procedure: LEFT BREAST LUMPECTOMY WITH RADIOACTIVE SEED LOCALIZATION;  Surgeon: Excell Seltzer, MD;   Location: [facility];  Service: General;  Laterality: Left;  . BREAST MASS EXCISION     age 36, benign tumor  . CARDIAC CATHETERIZATION    . CARDIAC CATHETERIZATION N/A 12/30/2015   Procedure: Left Heart Cath and Coronary Angiography;  Surgeon: Belva Crome, MD;  Location: [facility];  Service: Cardiovascular;  Laterality: N/A;  . COLONOSCOPY  07/15/2009   [facility]  . DILATATION & CURETTAGE/HYSTEROSCOPY WITH MYOSURE N/A 09/22/2016   Procedure: DILATATION & CURETTAGE/HYSTEROSCOPY WITH MYOSURE;  Surgeon: Molli Posey, MD;  Location: [facility];  Service: Gynecology;  Laterality: N/A;  . DILATION AND CURETTAGE OF UTERUS    . KNEE SURGERY     right,  removed cartilage  . PARATHYROIDECTOMY N/A 08/25/2014   Procedure: PARATHYROIDECTOMY;  Surgeon: Armandina Gemma, MD;  [facility];  Service: General;  Laterality: N/A;  . SHOULDER ARTHROSCOPY WITH ROTATOR CUFF REPAIR AND SUBACROMIAL DECOMPRESSION Right 02/27/2017   Procedure: Right shoulder arthroscopic rotator cuff repair with biceps tenodesis and subacromial decompression;  Surgeon: Nicholes Stairs, MD;  Location: [facility];  Service: Orthopedics;  Laterality: Right;  150 mins  . TONSILLECTOMY    . TUBAL LIGATION      Social History   Tobacco Use  . Smoking status: Never Smoker  . Smokeless tobacco: Never Used  Vaping Use  . Vaping Use: Never used  Substance Use Topics  . Alcohol use: Yes    Alcohol/week: 0.0 standard drinks    Comment: occ.  . Drug use: No    Family History  Problem Relation Age of Onset  . Breast cancer Mother 33  . Alcohol abuse Mother   . Alzheimer's disease Mother   . Lung cancer Mother   . Alcohol abuse Father   . Alzheimer's disease Father   . Parkinson's disease Father   . Alcohol abuse Maternal Grandfather   . Drug abuse Maternal Grandfather   . Alcohol abuse Maternal Grandmother   . Alcohol abuse Paternal Grandfather   . Alcohol abuse Paternal Grandmother   . Schizophrenia Cousin   . Diabetes  Brother   . Hypertension Brother   . Colon cancer Neg Hx   . Esophageal cancer Neg Hx   . Rectal cancer Neg Hx   . Stomach cancer Neg Hx     Allergies  Allergen Reactions  . Eggs Or Egg-Derived Products Hives and Other (See Comments)    Can eat foods with cooked eggs; CANNOT handle when part of flu vaccine, etc.   . Tetanus Toxoids     Vigorous local reaction to tdap with local pain and itching   . Lamictal [Lamotrigine] Hives and Rash    Medication list has been reviewed and updated.  Current Outpatient Medications on File Prior to Visit  Medication Sig Dispense Refill  . allopurinol (ZYLOPRIM) 100 MG tablet TAKE TWO TABLETS BY MOUTH DAILY 90 tablet 3  . benztropine (COGENTIN) 1 MG tablet Take 1 mg by mouth 2 (two) times daily.  2  . Biotin w/ Vitamins C & E (HAIR/SKIN/NAILS PO) Take by mouth.    . calcitRIOL (ROCALTROL) 0.25 MCG capsule TAKE ONE CAPSULE BY MOUTH DAILY 60 capsule 0  . divalproex (DEPAKOTE ER) 500 MG 24 hr tablet Take 1,500 mg by mouth at bedtime.     . empagliflozin (JARDIANCE) 10 MG TABS tablet Take 10 mg by mouth daily. 90 tablet 3  . furosemide (LASIX) 80 MG tablet Take 80mg  in the morning and 40mg  in the afternoon 135 tablet 5  . LATUDA 60 MG TABS Take 60 mg by mouth at bedtime.     . levalbuterol (XOPENEX HFA) 45 MCG/ACT inhaler Inhale 2 puffs into the lungs every 4 (four) hours as needed for wheezing. 1 Inhaler 1  . levothyroxine (SYNTHROID) 50 MCG tablet Take 1 tablet (50 mcg total) by mouth daily. 30 tablet 3  . lisinopril (ZESTRIL) 20 MG tablet Take 1 tablet (20 mg total) by mouth daily. 90 tablet 1  . metFORMIN (GLUCOPHAGE) 500 MG tablet Take 500 mg by mouth every evening.    . metoprolol tartrate (LOPRESSOR) 25 MG tablet Take 1 tablet (25 mg total) by mouth 2 (two) times daily. 180 tablet 3  .  nitroGLYCERIN (NITROSTAT) 0.4 MG SL tablet Place 1 tablet (0.4 mg total) under the tongue every 5 (five) minutes as needed for chest pain. For chest pain 25 tablet  3  . OLANZapine (ZYPREXA) 15 MG tablet Take 15 mg by mouth at bedtime.    . rivaroxaban (XARELTO) 20 MG TABS tablet Take 1 tablet (20 mg total) by mouth at bedtime. 90 tablet 3  . rosuvastatin (CRESTOR) 20 MG tablet Take 1 tablet (20 mg total) by mouth daily. 90 tablet 3  . UNABLE TO FIND Med Name: One Touch Verio blood glucose meter Lot #[id number] x Exp 01/22/2018     No current facility-administered medications on file prior to visit.    Review of Systems:  As per HPI- otherwise negative.   Physical Examination: Vitals:   11/26/19 0813  BP: 112/64  Pulse: 87  Resp: 18  SpO2: 98%   Vitals:   11/26/19 0813  Weight: (!) 304 lb (137.9 kg)  Height: 6' (1.829 m)   Body mass index is 41.23 kg/m. Ideal Body Weight: Weight in (lb) to have BMI = 25: 183.9 GEN: no acute distress.  Obese, looks well  HEENT: Atraumatic, Normocephalic.  Ears and Nose: No external deformity. CV: RRR, No M/G/R. No JVD. No thrill. No extra heart sounds. PULM: CTA B, no wheezes, crackles, rhonchi. No retractions. No resp. distress. No accessory muscle use. ABD: S, NT, ND, +BS. No rebound. No HSM. EXTR: No c/c/e PSYCH: Normally interactive. Conversant.  Mild tremor of bilateral hands, stable  She has some reproduction of hand sx with phalen's testing- negative [place]    Assessment and Plan: Acquired hypothyroidism - Plan: TSH  Type 2 diabetes mellitus without complication, unspecified whether long term insulin use (HCC)  HYPERTENSION, BENIGN ESSENTIAL  Estrogen deficiency - Plan: DG Bone Density  Bilateral carpal tunnel syndrome here today for follow-up of a few issues She has lost some weight!  She is exercising daily and working on her diet BP under good control Check TSH today and will adjust her thyroid meds as needed Order bone density scan  Plan to repeat her A1c at next visit in 4 months Suspect CTS - For your wrist/ hand symptoms, please try wearing wrist splints that prevent flexion  of the wrist joint at night for 2-3 weeks.  Let me know if not helpful or if getting worse   This visit occurred during the SARS-CoV-2 public health emergency.  Safety protocols were in place, including screening questions prior to the visit, additional usage of staff PPE, and extensive cleaning of exam room while observing appropriate contact time as indicated for disinfecting solutions.   Signed Lamar Blinks, MD  Addendum 11/4, received labs as below.  Message to patient Results for orders placed or performed in visit on 11/26/19  TSH  Result Value Ref Range   TSH 2.85 0.40 - 4.50 mIU/L

## 2019-11-26 ENCOUNTER — Encounter: Payer: Self-pay | Admitting: Family Medicine

## 2019-11-26 ENCOUNTER — Other Ambulatory Visit: Payer: Self-pay

## 2019-11-26 ENCOUNTER — Ambulatory Visit: Payer: BC Managed Care – PPO | Admitting: Family Medicine

## 2019-11-26 VITALS — BP 112/64 | HR 87 | Resp 18 | Ht 72.0 in | Wt 304.0 lb

## 2019-11-26 DIAGNOSIS — I10 Essential (primary) hypertension: Secondary | ICD-10-CM | POA: Diagnosis not present

## 2019-11-26 DIAGNOSIS — E2839 Other primary ovarian failure: Secondary | ICD-10-CM | POA: Diagnosis not present

## 2019-11-26 DIAGNOSIS — E039 Hypothyroidism, unspecified: Secondary | ICD-10-CM

## 2019-11-26 DIAGNOSIS — E119 Type 2 diabetes mellitus without complications: Secondary | ICD-10-CM | POA: Diagnosis not present

## 2019-11-26 DIAGNOSIS — G5603 Carpal tunnel syndrome, bilateral upper limbs: Secondary | ICD-10-CM

## 2019-11-27 ENCOUNTER — Encounter: Payer: Self-pay | Admitting: Family Medicine

## 2019-11-27 LAB — TSH: TSH: 2.85 mIU/L (ref 0.40–4.50)

## 2019-12-02 ENCOUNTER — Encounter: Payer: Self-pay | Admitting: Family Medicine

## 2019-12-02 ENCOUNTER — Other Ambulatory Visit: Payer: Self-pay

## 2019-12-02 ENCOUNTER — Ambulatory Visit (HOSPITAL_BASED_OUTPATIENT_CLINIC_OR_DEPARTMENT_OTHER)
Admission: RE | Admit: 2019-12-02 | Discharge: 2019-12-02 | Disposition: A | Discharge status: Home / Self Care | Payer: BC Managed Care – PPO | Source: Ambulatory Visit | Attending: Family Medicine | Admitting: Family Medicine

## 2019-12-02 DIAGNOSIS — E2839 Other primary ovarian failure: Secondary | ICD-10-CM | POA: Diagnosis not present

## 2019-12-02 DIAGNOSIS — M8580 Other specified disorders of bone density and structure, unspecified site: Secondary | ICD-10-CM | POA: Insufficient documentation

## 2019-12-08 ENCOUNTER — Ambulatory Visit (HOSPITAL_COMMUNITY)
Admission: RE | Admit: 2019-12-08 | Discharge: 2019-12-08 | Disposition: A | Discharge status: Home / Self Care | Payer: BC Managed Care – PPO | Source: Ambulatory Visit | Attending: Adult Health | Admitting: Adult Health

## 2019-12-08 ENCOUNTER — Other Ambulatory Visit: Payer: Self-pay

## 2019-12-08 ENCOUNTER — Encounter (HOSPITAL_COMMUNITY): Payer: Self-pay

## 2019-12-08 VITALS — BP 128/90 | HR 87 | Wt 301.2 lb

## 2019-12-08 DIAGNOSIS — Z7984 Long term (current) use of oral hypoglycemic drugs: Secondary | ICD-10-CM | POA: Insufficient documentation

## 2019-12-08 DIAGNOSIS — E785 Hyperlipidemia, unspecified: Secondary | ICD-10-CM | POA: Diagnosis not present

## 2019-12-08 DIAGNOSIS — Z79890 Hormone replacement therapy (postmenopausal): Secondary | ICD-10-CM | POA: Insufficient documentation

## 2019-12-08 DIAGNOSIS — I252 Old myocardial infarction: Secondary | ICD-10-CM | POA: Insufficient documentation

## 2019-12-08 DIAGNOSIS — Z86718 Personal history of other venous thrombosis and embolism: Secondary | ICD-10-CM | POA: Insufficient documentation

## 2019-12-08 DIAGNOSIS — Z79899 Other long term (current) drug therapy: Secondary | ICD-10-CM | POA: Diagnosis not present

## 2019-12-08 DIAGNOSIS — I10 Essential (primary) hypertension: Secondary | ICD-10-CM

## 2019-12-08 DIAGNOSIS — I5032 Chronic diastolic (congestive) heart failure: Secondary | ICD-10-CM | POA: Diagnosis not present

## 2019-12-08 DIAGNOSIS — I2510 Atherosclerotic heart disease of native coronary artery without angina pectoris: Secondary | ICD-10-CM | POA: Insufficient documentation

## 2019-12-08 DIAGNOSIS — N189 Chronic kidney disease, unspecified: Secondary | ICD-10-CM | POA: Diagnosis not present

## 2019-12-08 DIAGNOSIS — E1122 Type 2 diabetes mellitus with diabetic chronic kidney disease: Secondary | ICD-10-CM | POA: Diagnosis not present

## 2019-12-08 DIAGNOSIS — R0683 Snoring: Secondary | ICD-10-CM | POA: Diagnosis not present

## 2019-12-08 DIAGNOSIS — Z7901 Long term (current) use of anticoagulants: Secondary | ICD-10-CM | POA: Insufficient documentation

## 2019-12-08 DIAGNOSIS — I480 Paroxysmal atrial fibrillation: Secondary | ICD-10-CM

## 2019-12-08 DIAGNOSIS — Z6841 Body Mass Index (BMI) 40.0 and over, adult: Secondary | ICD-10-CM | POA: Insufficient documentation

## 2019-12-08 DIAGNOSIS — I130 Hypertensive heart and chronic kidney disease with heart failure and stage 1 through stage 4 chronic kidney disease, or unspecified chronic kidney disease: Secondary | ICD-10-CM | POA: Insufficient documentation

## 2019-12-08 NOTE — Progress Notes (Signed)
ReDS Vest / Clip - 12/08/19 1400      ReDS Vest / Clip   Station Marker D    Ruler Value 33    ReDS Value Range Low volume    ReDS Actual Value 27    Anatomical Comments sitting

## 2019-12-08 NOTE — Progress Notes (Signed)
ADVANCED HF CLINIC NOTE  Date:  12/08/2019   ID:  Kathleen [name], DOB August 23, 1955, MRN [id number]  Location: Home  Provider location: [facility] Type of Visit: Established patient  PCP:  [name], [name], MD  Cardiologist:  Glori Bickers, MD Primary HF: [place]  Chief Complaint: Heart Failure    History of Present Illness: [name] is a 64 y/o woman (mother of [name] -ICU nurse) with a h/o morbid obesity, chronic diastolic HF, DM2, HTN, HL and CAD. Sleep study 11/2013 was negative. Also has history of A fib.   Had NSTEMI in 2/09 had cath at that time with occlusion of septal perforator. Otherwise normal arteries. Was admitted for CP in 11/13. CE normal. Underwent dobutamine stress echo which was normal.   Admitted 09/11/14 for Afib RVR. She converted to sinus tach with dilt drip and was transitioned to Toprol-XL 25 mg daily. She was in NSR at the time of discharge. Discharge weight was 325 lb.  Admitted 7/12-7/14/17 with chest pain. CTA negative for PE. Troponins negative. Had Myoview 08/05/15 with "subtle" anterior wall defect.Felt tobe breast attenuation. Echo stable as below.   Holter monitor in 7/19: NSR with occasional PVCs and PACs. No other abnormalities. Multiple diary events which occasionally correspond to PVCs but often during NSR.  She was recently admitted 2/21 for atrial fibrillation w/ RVR in the setting of CAP and also w/ a/c diastolic HF, requiring IV diuretics. She was admitted by [facility] and general cardiology consulted. Echo repeated and EF 60-65%. RV ok. PNA treated w/ abx. AFib w/ Cardizem and metoprolol w/ spontaneous conversion back to NSR. She diuresed well w/ IV Lasix and transitioned back to PO Lasix. Hospital course also notable for AKI. SCr rose to 2.09 during admit but improved down to 1.46 on d/c. D/c wt 330 lb.  Today she returns for HF follow up.Overall feeling fine. Denies SOB/PND/Orthopnea.  Appetite ok. No fever or chills. Weight at home has gone down to 300 pounds. Taking all medications. She is exercising 5 days a week!!! She graduated from the [employer].    Past Medical History:  Diagnosis Date  . Anemia   . Anxiety   . Asthma   . Atrial fibrillation ([place])   . Bipolar disorder ([place])   . CAD (coronary artery disease)   . Cancer ([place])    basal cell carcinoma on right hand, papilloma left breast  . CHF (congestive heart failure) ([place])    pt seen at heart/vascular spec clinic 08/14/2014   . Chronic kidney disease   . Coarse tremors    hands  . Coronary artery disease   . Depression   . DVT (deep vein thrombosis) in pregnancy    pt. reports that it was post knee surgery, not during pregnancy  . Dyslipidemia   . Fall   . Fatty liver   . Headache    migraines - stopped after menopause  . Heart attack ([place]) 02/2007   non-q-wave with second septal perforator  . Hyperlipidemia   . Hypertension   . Hypothyroidism    history of took medication, has resolved  . Knee pain, left   . Lower extremity deep venous thrombosis (HCC)    20 years ago   . Measles    hx of in childhood   . Morbid obesity with BMI of 40.0-44.9, adult ([place])   . Pain at surgical incision    Left breast from procedure 09/11/16  .  Pneumonia    hx of   . PONV (postoperative nausea and vomiting)    also slow to wake up  . Psychiatric hospitalization 05/2008  . Shingles    20 years ago   . Shortness of breath dyspnea    exercise   . Type 2 diabetes mellitus ([place]) 12/31/2015  . Urinary incontinence   . UTI (urinary tract infection)    Past Surgical History:  Procedure Laterality Date  . BASAL CELL CARCINOMA EXCISION    . BREAST EXCISIONAL BIOPSY Left   . BREAST LUMPECTOMY WITH RADIOACTIVE SEED LOCALIZATION Left 09/11/2016   Procedure: LEFT BREAST LUMPECTOMY WITH RADIOACTIVE SEED LOCALIZATION;  Surgeon: Excell Seltzer, MD;  Location: [facility];  Service: General;  Laterality: Left;  . BREAST  MASS EXCISION     age 78, benign tumor  . CARDIAC CATHETERIZATION    . CARDIAC CATHETERIZATION N/A 12/30/2015   Procedure: Left Heart Cath and Coronary Angiography;  Surgeon: Belva Crome, MD;  Location: [facility];  Service: Cardiovascular;  Laterality: N/A;  . COLONOSCOPY  07/15/2009   [facility]  . DILATATION & CURETTAGE/HYSTEROSCOPY WITH MYOSURE N/A 09/22/2016   Procedure: DILATATION & CURETTAGE/HYSTEROSCOPY WITH MYOSURE;  Surgeon: Molli Posey, MD;  Location: [facility];  Service: Gynecology;  Laterality: N/A;  . DILATION AND CURETTAGE OF UTERUS    . KNEE SURGERY     right, removed cartilage  . PARATHYROIDECTOMY N/A 08/25/2014   Procedure: PARATHYROIDECTOMY;  Surgeon: Armandina Gemma, MD;  [facility];  Service: General;  Laterality: N/A;  . SHOULDER ARTHROSCOPY WITH ROTATOR CUFF REPAIR AND SUBACROMIAL DECOMPRESSION Right 02/27/2017   Procedure: Right shoulder arthroscopic rotator cuff repair with biceps tenodesis and subacromial decompression;  Surgeon: Nicholes Stairs, MD;  Location: [facility];  Service: Orthopedics;  Laterality: Right;  150 mins  . TONSILLECTOMY    . TUBAL LIGATION       Current Outpatient Medications  Medication Sig Dispense Refill  . allopurinol (ZYLOPRIM) 100 MG tablet TAKE TWO TABLETS BY MOUTH DAILY 90 tablet 3  . benztropine (COGENTIN) 1 MG tablet Take 1 mg by mouth 2 (two) times daily.  2  . Biotin w/ Vitamins C & E (HAIR/SKIN/NAILS PO) Take by mouth.    . calcitRIOL (ROCALTROL) 0.25 MCG capsule TAKE ONE CAPSULE BY MOUTH DAILY 60 capsule 0  . divalproex (DEPAKOTE ER) 500 MG 24 hr tablet Take 1,500 mg by mouth at bedtime.     . empagliflozin (JARDIANCE) 10 MG TABS tablet Take 10 mg by mouth daily. 90 tablet 3  . furosemide (LASIX) 80 MG tablet Take 80mg  in the morning and 40mg  in the afternoon 135 tablet 5  . LATUDA 60 MG TABS Take 60 mg by mouth at bedtime.     . levalbuterol (XOPENEX HFA) 45 MCG/ACT inhaler Inhale 2 puffs into the lungs every 4 (four)  hours as needed for wheezing. 1 Inhaler 1  . levothyroxine (SYNTHROID) 50 MCG tablet Take 1 tablet (50 mcg total) by mouth daily. 30 tablet 3  . lisinopril (ZESTRIL) 20 MG tablet Take 1 tablet (20 mg total) by mouth daily. 90 tablet 1  . metFORMIN (GLUCOPHAGE) 500 MG tablet Take 500 mg by mouth every evening.    . metoprolol tartrate (LOPRESSOR) 25 MG tablet Take 1 tablet (25 mg total) by mouth 2 (two) times daily. 180 tablet 3  . nitroGLYCERIN (NITROSTAT) 0.4 MG SL tablet Place 1 tablet (0.4 mg total) under the tongue every 5 (five) minutes as needed for chest  pain. For chest pain 25 tablet 3  . OLANZapine (ZYPREXA) 15 MG tablet Take 15 mg by mouth at bedtime.    . rivaroxaban (XARELTO) 20 MG TABS tablet Take 1 tablet (20 mg total) by mouth at bedtime. 90 tablet 3  . rosuvastatin (CRESTOR) 20 MG tablet Take 1 tablet (20 mg total) by mouth daily. 90 tablet 3  . UNABLE TO FIND Med Name: One Touch Verio blood glucose meter Lot #[id number] x Exp 01/22/2018     No current facility-administered medications for this encounter.    Allergies:   Eggs or egg-derived products, Tetanus toxoids, and Lamictal [lamotrigine]   Social History:  The patient  reports that she has never smoked. She has never used smokeless tobacco. She reports current alcohol use. She reports that she does not use drugs.   Family History:  The patient's family history includes Alcohol abuse in her father, maternal grandfather, maternal grandmother, mother, paternal grandfather, and paternal grandmother; Alzheimer's disease in her father and mother; Breast cancer (age of onset: 16) in her mother; Diabetes in her brother; Drug abuse in her maternal grandfather; Hypertension in her brother; Lung cancer in her mother; Parkinson's disease in her father; Schizophrenia in her cousin.   ROS:  Please see the history of present illness.   All other systems are personally reviewed and negative.  Vitals:   12/08/19 1424  BP: 128/90  Pulse:  87  SpO2: 97%   Wt Readings from Last 3 Encounters:  12/08/19 (!) 136.6 kg (301 lb 3.2 oz)  11/26/19 (!) 137.9 kg (304 lb)  10/15/19 (!) 140.3 kg (309 lb 6.4 oz)  Reds Clip 27%  General:  Walked in the clinic. No resp difficulty HEENT: normal Neck: supple. no JVD. Carotids 2+ bilat; no bruits. No lymphadenopathy or thryomegaly appreciated. Cor: PMI nondisplaced. Regular rate & rhythm. No rubs, gallops or murmurs. Lungs: clear Abdomen: obese, soft, nontender, nondistended. No hepatosplenomegaly. No bruits or masses. Good bowel sounds. Extremities: no cyanosis, clubbing, rash, edema Neuro: alert & orientedx3, cranial nerves grossly intact. moves all 4 extremities w/o difficulty. Affect pleasant  Recent Labs: 03/02/2019: Magnesium 2.3 03/25/2019: B Natriuretic Peptide 46.0 10/15/2019: ALT 18; BUN 43; Creatinine, Ser 1.53; Hemoglobin 14.7; Platelets 112; Potassium 4.6; Sodium 142 11/26/2019: TSH 2.85  Personally reviewed   Wt Readings from Last 3 Encounters:  12/08/19 (!) 136.6 kg (301 lb 3.2 oz)  11/26/19 (!) 137.9 kg (304 lb)  10/15/19 (!) 140.3 kg (309 lb 6.4 oz)      ASSESSMENT AND PLAN:  1. Chronic diastolic HF:  -ECHO 03/5359 EF Normal EF 55-60% Grade IDD  -Echo 7/19 EF 55-60%  -Echo 2/21 EF 60-65%, RV ok  -NYHA II. Reds Clip 27%. Volume status stable.  - continue Lasix 80 mg qam/40 mg qpm - continue Jardiance 10 mg daily  - continue metoprolol 25 mg bid  - I reviewed BMET from 10/15/19. Stable.   2. PAF - Regular on exam.  -  Continue metoprolol for rate control   - CHADSVASC = 3. - Continue Xarelto. No bleeding   3. Morbid obesity: -Body mass index is 40.85 kg/m. - Discussed portion control - Congratulated on her progress. Weight down another 8 pounds!!!  4. CAD:  - No s/s ischemia - no ASA due to Xarelto - continue statin   5. HTN:  Stable.   6. Snoring:  - No OSA on previous sleep study.   7. DM2 -ContinueJardiance 10 mg daily - Continue  metformin  - 10/14/19 HgbA1c  6.5   Follow in 4-6 months.  Jeanmarie Hubert, NP  12/08/2019 2:28 PM  [facility] [address] [facility] [place] [address] [phone] (office) [phone] (fax)

## 2019-12-08 NOTE — Patient Instructions (Signed)
Please call our office in April 2022 to schedule your follow up appointment  If you have any questions or concerns before your next appointment please send [place] a message through [place] or call our office at [phone].    TO LEAVE A MESSAGE FOR THE NURSE SELECT OPTION 2, PLEASE LEAVE A MESSAGE INCLUDING: . YOUR NAME . DATE OF BIRTH . CALL BACK NUMBER . REASON FOR CALL**this is important as we prioritize the call backs  [place] AS LONG AS YOU CALL BEFORE 4:00 PM  At the [facility], you and your health needs are our priority. As part of our continuing mission to provide you with exceptional heart care, we have created designated Provider Care Teams. These Care Teams include your primary Cardiologist (physician) and Advanced Practice Providers (APPs- Physician Assistants and Nurse Practitioners) who all work together to provide you with the care you need, when you need it.   You may see any of the following providers on your designated Care Team at your next follow up: [name] Dr Glori Bickers . Dr Loralie Champagne . Darrick Grinder, NP . Lyda Jester, PA . Audry Riles, PharmD   Please be sure to bring in all your medications bottles to every appointment.

## 2019-12-12 ENCOUNTER — Other Ambulatory Visit: Payer: Self-pay | Admitting: Family Medicine

## 2019-12-12 DIAGNOSIS — R7989 Other specified abnormal findings of blood chemistry: Secondary | ICD-10-CM

## 2019-12-13 ENCOUNTER — Encounter: Payer: Self-pay | Admitting: Family Medicine

## 2019-12-16 ENCOUNTER — Other Ambulatory Visit: Payer: Self-pay | Admitting: Family Medicine

## 2019-12-16 DIAGNOSIS — R7989 Other specified abnormal findings of blood chemistry: Secondary | ICD-10-CM

## 2019-12-29 ENCOUNTER — Encounter: Payer: Self-pay | Admitting: Family Medicine

## 2020-01-04 ENCOUNTER — Encounter: Payer: Self-pay | Admitting: Family Medicine

## 2020-01-13 ENCOUNTER — Encounter: Payer: Self-pay | Admitting: Family Medicine

## 2020-01-13 MED ORDER — METHOCARBAMOL 500 MG PO TABS: 500.0000 mg | ORAL_TABLET | Freq: Three times a day (TID) | ORAL | 0 refills | Status: DC | PRN

## 2020-01-13 NOTE — Telephone Encounter (Signed)
Patient states she gave you two hours for rx. She is at the pharmacy right now.

## 2020-01-13 NOTE — Addendum Note (Signed)
Addended by: Lamar Blinks C on: 01/13/2020 04:02 PM   Modules accepted: Orders

## 2020-01-18 ENCOUNTER — Other Ambulatory Visit: Payer: Self-pay | Admitting: Family Medicine

## 2020-01-18 DIAGNOSIS — I10 Essential (primary) hypertension: Secondary | ICD-10-CM

## 2020-02-08 ENCOUNTER — Other Ambulatory Visit: Payer: Self-pay | Admitting: Family Medicine

## 2020-02-08 DIAGNOSIS — E039 Hypothyroidism, unspecified: Secondary | ICD-10-CM

## 2020-03-24 ENCOUNTER — Other Ambulatory Visit: Payer: Self-pay | Admitting: Family Medicine

## 2020-03-24 DIAGNOSIS — E119 Type 2 diabetes mellitus without complications: Secondary | ICD-10-CM

## 2020-03-25 ENCOUNTER — Ambulatory Visit: Payer: BC Managed Care – PPO | Admitting: Family Medicine

## 2020-03-27 NOTE — Patient Instructions (Addendum)
It was great to see you again today, I will be in touch with your labs.  Assuming all is well we can plan to visit in 6 months Cardiology will contact you to further discuss and evaluate your passing out episode and place a heart monitor

## 2020-03-27 NOTE — Progress Notes (Addendum)
[facility] [place], [place], [place] [address] [phone]  Date:  03/29/2020   Name:  Kathleen [name]   DOB:  [birth year]   MRN:  [id number]  PCP:  Darreld Mclean, MD    Chief Complaint: Diabetes (4 month Follow up - meter is dead have not checking blood sugar for at least a year.)   History of Present Illness:  Kathleen [name] is a 65 y.o. very pleasant female patient who presents with the following:  Patient seen today for periodic routine follow-up (however, during visit she also mentions syncopal episode as below)- h/o morbid obesity, chronic diastolic HF, DM2, hypothyroidism, hyperparathyroidism status post parathyroidectomy, HTN, HL and CAD. Sleep study 11/2013 was negative. Also has history of A fib, bipolar disorderLast seen by myself in November-at that time she was trying to exercise and has lost some weight She has continued to work out and has lost more weight since that time  She is followed in the advanced heart failure clinic, most recent visit in November  Update A1c today Can update foot exam Immunizations are complete including Shingrix  She is continuing to lose weight slow and steady  She notes that she has had a few falls She slipped on ice once and fell She notes she also fell out of her bed while asleep on 2 occasions Most importantly, she had a syncopal episode at the [facility] 2 days ago while exercising on elliptical She was working out on a cardio machine- she started to feel faint, like her heart was racing and then had apparent LOC She slumped onto the machine did not fall on the ground  She noted a fluttering feeling at that time - this started while she was exercising but is now cleared up. She also had CP before the LOC and after- she thinks this lasted about 30 minutes total- now resolved No SOB during this episode The trainer saw that something was wrong, and went to her- she  recovered on her own and was able to go home on her own No apparent seizure activity She did not have urinary incontinence   Never had any LOC in the past  She has intermittent a fib- she does not feel like she is in a fib now  She wonders if she may have been in A. fib at the time of her fainting episode  Lab Results  Component Value Date   TSH 2.85 11/26/2019   She is taking lasix 80 am and 40 pm  Her home weight was 290 this am She feels like she might have 2lbs of fluid weight on her -her dry weight is decreasing due to weight loss  Wt Readings from Last 3 Encounters:  03/29/20 298 lb (135.2 kg)  12/08/19 (!) 301 lb 3.2 oz (136.6 kg)  11/26/19 (!) 304 lb (137.9 kg)     Lab Results  Component Value Date   HGBA1C 6.5 (H) 10/14/2019    Lab Results  Component Value Date   TSH 2.85 11/26/2019    Patient Active Problem List   Diagnosis Date Noted  . Osteopenia 12/02/2019  . AKI (acute kidney injury) ([place])   . CAP (community acquired pneumonia) 03/01/2019  . Atrial fibrillation with RVR ([place]) 02/28/2019  . Chronic kidney disease (CKD), stage III (moderate) ([place]) 01/14/2019  . Vitamin D deficiency 07/22/2018  . Complete rotator cuff tear or rupture of right shoulder, not specified  as traumatic 02/27/2017  . Auditory hallucination 12/13/2016  . Gout 06/29/2016  . Type 2 diabetes mellitus ([place]) 12/31/2015  . Coronary artery dissection   . Lung nodule seen on imaging study 09/24/2014  . Paroxysmal atrial fibrillation ([place]) 09/11/2014  . Asthmatic bronchitis with exacerbation 05/25/2014  . Primary hyperparathyroidism ([place]) 04/15/2014  . Chronic diastolic heart failure ([place]) 10/02/2013  . Shortness of breath 08/31/2013  . Venous stasis dermatitis of both lower extremities 08/19/2013  . Nocturia 06/12/2013  . Morbid obesity ([place]) 08/09/2012  . Urinary incontinence, urge 07/11/2012  . CAD (coronary artery disease) 10/31/2010  . NEOPLASM OF UNCERTAIN BEHAVIOR OF SKIN  02/11/2010  . Bipolar disorder ([place]) 09/14/2009  . ACUT MYOCARD INFARCT UNS SITE SUBSQT EPIS CARE 07/02/2008  . Hyperlipidemia 05/23/2008  . UNSPECIFIED DISORDER OF THYROID 02/04/2008  . HYPERTENSION, BENIGN ESSENTIAL 02/04/2008  . MYOCARDIAL INFARCTION, HX OF 03/05/2007    Past Medical History:  Diagnosis Date  . Anemia   . Anxiety   . Asthma   . Atrial fibrillation ([place])   . Bipolar disorder ([place])   . CAD (coronary artery disease)   . Cancer ([place])    basal cell carcinoma on right hand, papilloma left breast  . CHF (congestive heart failure) ([place])    pt seen at heart/vascular spec clinic 08/14/2014   . Chronic kidney disease   . Coarse tremors    hands  . Coronary artery disease   . Depression   . DVT (deep vein thrombosis) in pregnancy    pt. reports that it was post knee surgery, not during pregnancy  . Dyslipidemia   . Fall   . Fatty liver   . Headache    migraines - stopped after menopause  . Heart attack ([place]) 02/2007   non-q-wave with second septal perforator  . Hyperlipidemia   . Hypertension   . Hypothyroidism    history of took medication, has resolved  . Knee pain, left   . Lower extremity deep venous thrombosis (HCC)    20 years ago   . Measles    hx of in childhood   . Morbid obesity with BMI of 40.0-44.9, adult ([place])   . Pain at surgical incision    Left breast from procedure 09/11/16  . Pneumonia    hx of   . PONV (postoperative nausea and vomiting)    also slow to wake up  . Psychiatric hospitalization 05/2008  . Shingles    20 years ago   . Shortness of breath dyspnea    exercise   . Type 2 diabetes mellitus ([place]) 12/31/2015  . Urinary incontinence   . UTI (urinary tract infection)     Past Surgical History:  Procedure Laterality Date  . BASAL CELL CARCINOMA EXCISION    . BREAST EXCISIONAL BIOPSY Left   . BREAST LUMPECTOMY WITH RADIOACTIVE SEED LOCALIZATION Left 09/11/2016   Procedure: LEFT BREAST LUMPECTOMY WITH RADIOACTIVE SEED  LOCALIZATION;  Surgeon: Excell Seltzer, MD;  Location: [facility];  Service: General;  Laterality: Left;  . BREAST MASS EXCISION     age 49, benign tumor  . CARDIAC CATHETERIZATION    . CARDIAC CATHETERIZATION N/A 12/30/2015   Procedure: Left Heart Cath and Coronary Angiography;  Surgeon: Belva Crome, MD;  Location: [facility];  Service: Cardiovascular;  Laterality: N/A;  . COLONOSCOPY  07/15/2009   [facility]  . DILATATION & CURETTAGE/HYSTEROSCOPY WITH MYOSURE N/A 09/22/2016   Procedure: DILATATION & CURETTAGE/HYSTEROSCOPY WITH MYOSURE;  Surgeon: Molli Posey, MD;  Location: [facility];  Service: Gynecology;  Laterality: N/A;  . DILATION AND CURETTAGE OF UTERUS    . KNEE SURGERY     right, removed cartilage  . PARATHYROIDECTOMY N/A 08/25/2014   Procedure: PARATHYROIDECTOMY;  Surgeon: Armandina Gemma, MD;  Location: [facility];  Service: General;  Laterality: N/A;  . SHOULDER ARTHROSCOPY WITH ROTATOR CUFF REPAIR AND SUBACROMIAL DECOMPRESSION Right 02/27/2017   Procedure: Right shoulder arthroscopic rotator cuff repair with biceps tenodesis and subacromial decompression;  Surgeon: Nicholes Stairs, MD;  Location: [facility];  Service: Orthopedics;  Laterality: Right;  150 mins  . TONSILLECTOMY    . TUBAL LIGATION      Social History   Tobacco Use  . Smoking status: Never Smoker  . Smokeless tobacco: Never Used  Vaping Use  . Vaping Use: Never used  Substance Use Topics  . Alcohol use: Yes    Alcohol/week: 0.0 standard drinks    Comment: occ.  . Drug use: No    Family History  Problem Relation Age of Onset  . Breast cancer Mother 62  . Alcohol abuse Mother   . Alzheimer's disease Mother   . Lung cancer Mother   . Alcohol abuse Father   . Alzheimer's disease Father   . Parkinson's disease Father   . Alcohol abuse Maternal Grandfather   . Drug abuse Maternal Grandfather   . Alcohol abuse Maternal Grandmother   . Alcohol abuse Paternal Grandfather   . Alcohol abuse Paternal  Grandmother   . Schizophrenia Cousin   . Diabetes Brother   . Hypertension Brother   . Colon cancer Neg Hx   . Esophageal cancer Neg Hx   . Rectal cancer Neg Hx   . Stomach cancer Neg Hx     Allergies  Allergen Reactions  . Eggs Or Egg-Derived Products Hives and Other (See Comments)    Can eat foods with cooked eggs; CANNOT handle when part of flu vaccine, etc.   . Tetanus Toxoids     Vigorous local reaction to tdap with local pain and itching   . Lamictal [Lamotrigine] Hives and Rash    Medication list has been reviewed and updated.  Current Outpatient Medications on File Prior to Visit  Medication Sig Dispense Refill  . allopurinol (ZYLOPRIM) 100 MG tablet TAKE TWO TABLETS BY MOUTH DAILY 90 tablet 3  . benztropine (COGENTIN) 1 MG tablet Take 1 mg by mouth 2 (two) times daily.  2  . Biotin w/ Vitamins C & E (HAIR/SKIN/NAILS PO) Take by mouth.    . calcitRIOL (ROCALTROL) 0.25 MCG capsule Take 1 capsule (0.25 mcg total) by mouth daily. 180 capsule 0  . divalproex (DEPAKOTE ER) 500 MG 24 hr tablet Take 1,500 mg by mouth at bedtime.     . empagliflozin (JARDIANCE) 10 MG TABS tablet Take 1 tablet (10 mg total) by mouth daily. 90 tablet 0  . furosemide (LASIX) 80 MG tablet Take 80mg  in the morning and 40mg  in the afternoon 135 tablet 5  . LATUDA 60 MG TABS Take 60 mg by mouth at bedtime.     [name] levothyroxine (SYNTHROID) 50 MCG tablet Take 1 tablet (50 mcg total) by mouth daily before breakfast. 90 tablet 1  . lisinopril (ZESTRIL) 20 MG tablet Take 1 tablet (20 mg total) by mouth daily. 90 tablet 0  . metFORMIN (GLUCOPHAGE) 500 MG tablet Take 500 mg by mouth every evening.    . methocarbamol (ROBAXIN) 500 MG tablet Take 1 tablet (500 mg total) by mouth  every 8 (eight) hours as needed for muscle spasms. 30 tablet 0  . metoprolol tartrate (LOPRESSOR) 25 MG tablet Take 1 tablet (25 mg total) by mouth 2 (two) times daily. 180 tablet 1  . nitroGLYCERIN (NITROSTAT) 0.4 MG SL tablet Place 1  tablet (0.4 mg total) under the tongue every 5 (five) minutes as needed for chest pain. For chest pain 25 tablet 3  . OLANZapine (ZYPREXA) 15 MG tablet Take 15 mg by mouth at bedtime.    . rivaroxaban (XARELTO) 20 MG TABS tablet Take 1 tablet (20 mg total) by mouth at bedtime. 90 tablet 3  . rosuvastatin (CRESTOR) 20 MG tablet Take 1 tablet (20 mg total) by mouth daily. 90 tablet 3  . UNABLE TO FIND Med Name: One Touch Verio blood glucose meter Lot #[id number] x Exp 01/22/2018    . levalbuterol (XOPENEX HFA) 45 MCG/ACT inhaler Inhale 2 puffs into the lungs every 4 (four) hours as needed for wheezing. 1 Inhaler 1   No current facility-administered medications on file prior to visit.    Review of Systems:  As per HPI- otherwise negative.   Physical Examination: Vitals:   03/29/20 0915  BP: 128/80  Pulse: 73  Temp: (!) 96.9 F (36.1 C)  SpO2: 97%   Vitals:   03/29/20 0915  Weight: 298 lb (135.2 kg)  Height: 6' (1.829 m)   Body mass index is 40.42 kg/m. Ideal Body Weight: Weight in (lb) to have BMI = 25: 183.9  GEN: no acute distress.,  Obese but has lost weight HEENT: Atraumatic, Normocephalic.  Ears and Nose: No external deformity. CV: RRR, No M/G/R. No JVD. No thrill. No extra heart sounds. PULM: CTA B, no wheezes, crackles, rhonchi. No retractions. No resp. distress. No accessory muscle use. ABD: S, NT, ND, +BS. No rebound. No HSM. EXTR: No c/c.  Chronic lower extremity edema appears stable and minimal PSYCH: Normally interactive. Conversant.   EKG: NSR, compared with previous EKG no change or concern noted Assessment and Plan: Acquired hypothyroidism - Plan: TSH  Type 2 diabetes mellitus without complication, unspecified whether long term insulin use ([place]) - Plan: Hemoglobin D1S, Basic metabolic panel  HYPERTENSION, BENIGN ESSENTIAL  Morbid obesity ([place])  Syncope, unspecified syncope type - Plan: EKG 12-Lead, Troponin I (High Sensitivity)  Paroxysmal atrial  fibrillation (HCC)  Lower extremity edema - Plan: CBC, B Nat Peptide  Patient seen today for follow-up, she has had a syncopal episode 2 days ago while exercising.  I have been in touch with her cardiologist Dr. [name] and they plan to contact her for follow-up.  Most likely she will need a Zio patch heart monitor In the meantime, I advised Neoma Laming she may exercise but take things easier, do not exert excessively We will await her troponin and BNP Hypoglycemia also possible, she is treated for diabetes but has been actively losing weight and exercising.  Await her A1c, we may decrease her hypoglycemic medications Will plan further follow- up pending labs. If any further syncopal symptoms, chest pain or shortness of breath she will seek care right away She is on anticoagulation so pulmonary embolism unlikely  This visit occurred during the SARS-CoV-2 public health emergency.  Safety protocols were in place, including screening questions prior to the visit, additional usage of staff PPE, and extensive cleaning of exam room while observing appropriate contact time as indicated for disinfecting solutions.    Signed Lamar Blinks, MD  Received her labs, message to pt  Results for orders placed or  performed in visit on 03/29/20  Hemoglobin A1c  Result Value Ref Range   Hgb A1c MFr Bld 6.4 4.6 - 6.5 %  Basic metabolic panel  Result Value Ref Range   Sodium 142 135 - 145 mEq/L   Potassium 4.3 3.5 - 5.1 mEq/L   Chloride 100 96 - 112 mEq/L   CO2 32 19 - 32 mEq/L   Glucose, Bld 110 (H) 70 - 99 mg/dL   BUN 27 (H) 6 - 23 mg/dL   Creatinine, Ser 1.58 (H) 0.40 - 1.20 mg/dL   GFR 34.39 (L) >60.00 mL/min   Calcium 9.5 8.4 - 10.5 mg/dL  TSH  Result Value Ref Range   TSH 1.80 0.35 - 4.50 uIU/mL  CBC  Result Value Ref Range   WBC 4.9 4.0 - 10.5 K/uL   RBC 4.95 3.87 - 5.11 Mil/uL   Platelets 137.0 (L) 150.0 - 400.0 K/uL   Hemoglobin 15.1 (H) 12.0 - 15.0 g/dL   HCT 45.0 36.0 - 46.0 %    MCV 90.9 78.0 - 100.0 fl   MCHC 33.6 30.0 - 36.0 g/dL   RDW 15.4 11.5 - 15.5 %  B Nat Peptide  Result Value Ref Range   Pro B Natriuretic peptide (BNP) 32.0 0.0 - 100.0 pg/mL  Troponin I (High Sensitivity)  Result Value Ref Range   High Sens Troponin I 4 2 - 17 ng/L

## 2020-03-29 ENCOUNTER — Encounter: Payer: Self-pay | Admitting: Family Medicine

## 2020-03-29 ENCOUNTER — Other Ambulatory Visit: Payer: Self-pay

## 2020-03-29 ENCOUNTER — Ambulatory Visit: Payer: BC Managed Care – PPO | Admitting: Family Medicine

## 2020-03-29 VITALS — BP 128/80 | HR 73 | Temp 96.9°F | Ht 72.0 in | Wt 298.0 lb

## 2020-03-29 DIAGNOSIS — E039 Hypothyroidism, unspecified: Secondary | ICD-10-CM

## 2020-03-29 DIAGNOSIS — E119 Type 2 diabetes mellitus without complications: Secondary | ICD-10-CM | POA: Diagnosis not present

## 2020-03-29 DIAGNOSIS — I10 Essential (primary) hypertension: Secondary | ICD-10-CM | POA: Diagnosis not present

## 2020-03-29 DIAGNOSIS — R600 Localized edema: Secondary | ICD-10-CM

## 2020-03-29 DIAGNOSIS — R55 Syncope and collapse: Secondary | ICD-10-CM | POA: Diagnosis not present

## 2020-03-29 DIAGNOSIS — I480 Paroxysmal atrial fibrillation: Secondary | ICD-10-CM

## 2020-03-29 LAB — BASIC METABOLIC PANEL
BUN: 27 mg/dL — ABNORMAL HIGH (ref 6–23)
CO2: 32 mEq/L (ref 19–32)
Calcium: 9.5 mg/dL (ref 8.4–10.5)
Chloride: 100 mEq/L (ref 96–112)
Creatinine, Ser: 1.58 mg/dL — ABNORMAL HIGH (ref 0.40–1.20)
GFR: 34.39 mL/min — ABNORMAL LOW (ref >60.00)
Glucose, Bld: 110 mg/dL — ABNORMAL HIGH (ref 70–99)
Potassium: 4.3 mEq/L (ref 3.5–5.1)
Sodium: 142 mEq/L (ref 135–145)

## 2020-03-29 LAB — CBC
HCT: 45 % (ref 36.0–46.0)
Hemoglobin: 15.1 g/dL — ABNORMAL HIGH (ref 12.0–15.0)
MCHC: 33.6 g/dL (ref 30.0–36.0)
MCV: 90.9 fl (ref 78.0–100.0)
Platelets: 137 10*3/uL — ABNORMAL LOW (ref 150.0–400.0)
RBC: 4.95 Mil/uL (ref 3.87–5.11)
RDW: 15.4 % (ref 11.5–15.5)
WBC: 4.9 10*3/uL (ref 4.0–10.5)

## 2020-03-29 LAB — TROPONIN I (HIGH SENSITIVITY): High Sens Troponin I: 4 ng/L (ref 2–17)

## 2020-03-29 LAB — HEMOGLOBIN A1C: Hgb A1c MFr Bld: 6.4 % (ref 4.6–6.5)

## 2020-03-29 LAB — TSH: TSH: 1.8 u[IU]/mL (ref 0.35–4.50)

## 2020-03-29 LAB — BRAIN NATRIURETIC PEPTIDE: Pro B Natriuretic peptide (BNP): 32 pg/mL (ref 0.0–100.0)

## 2020-03-31 ENCOUNTER — Other Ambulatory Visit (HOSPITAL_COMMUNITY): Payer: Self-pay | Admitting: Internal Medicine

## 2020-03-31 ENCOUNTER — Telehealth (HOSPITAL_COMMUNITY): Payer: Self-pay | Admitting: *Deleted

## 2020-03-31 DIAGNOSIS — R55 Syncope and collapse: Secondary | ICD-10-CM

## 2020-03-31 NOTE — Telephone Encounter (Signed)
Per Dr [name] pt recently saw PCP for a syncopal episode, he would like pt to wear a 2 wk Zio monitor.  Spoke w/pt, she is aware and agreeable, pt sch to come for monitor 3/10

## 2020-04-01 ENCOUNTER — Ambulatory Visit (HOSPITAL_COMMUNITY)
Admission: RE | Admit: 2020-04-01 | Discharge: 2020-04-01 | Disposition: A | Discharge status: Home / Self Care | Payer: BC Managed Care – PPO | Source: Ambulatory Visit | Attending: Internal Medicine | Admitting: Internal Medicine

## 2020-04-01 ENCOUNTER — Other Ambulatory Visit: Payer: Self-pay

## 2020-04-01 DIAGNOSIS — R55 Syncope and collapse: Secondary | ICD-10-CM

## 2020-04-08 ENCOUNTER — Other Ambulatory Visit: Payer: Self-pay | Admitting: Family Medicine

## 2020-04-08 DIAGNOSIS — E7800 Pure hypercholesterolemia, unspecified: Secondary | ICD-10-CM

## 2020-04-16 ENCOUNTER — Other Ambulatory Visit: Payer: Self-pay | Admitting: Family Medicine

## 2020-04-26 ENCOUNTER — Encounter (HOSPITAL_COMMUNITY): Payer: Self-pay

## 2020-04-28 ENCOUNTER — Telehealth: Payer: Self-pay | Admitting: Family Medicine

## 2020-04-28 NOTE — Telephone Encounter (Signed)
Letter placed up front , called and lvm to notify patient

## 2020-04-28 NOTE — Telephone Encounter (Signed)
Patient would like to pick up a copy of 03/29/20 lab results. Please call her when ready

## 2020-05-07 ENCOUNTER — Other Ambulatory Visit: Payer: Self-pay | Admitting: Family Medicine

## 2020-05-29 NOTE — Patient Instructions (Addendum)
Good to see you again today  Please stop by the pharamcy on the ground floor today and get your 4th dose of covid vaccine I think the black spot you are seeing on the right pinky toenail is a bit of blood under the nail- likely from an injury It does appear to be growing out. Please monitor this - I expect it will grow out over the next few months.  If you will get your nails cut I think the growing out will be easier to see!    Let me know if the black spot does not appear to be moving.  Otherwise, let's please visit in about 3 months

## 2020-05-29 NOTE — Progress Notes (Signed)
[facility] [address], [place], [place] [address] [phone]  Date:  05/31/2020   Name:  Kathleen [name]   DOB:  Nov 03, 1955   MRN:  [id number]  PCP:  Darreld Mclean, MD    Chief Complaint: Diabetes (Foot exam, follow up)   History of Present Illness:  Kathleen [name] is a 65 y.o. very pleasant female patient who presents with the following:  Follow-up visit today-needs a foot check.  She is also concerned about a change in toenail Not painful, she has just noted a color change for about 6 months Last seen by myself in March of this year -  h/o morbid obesity, chronic diastolic HF, DM2, hypothyroidism,hyperparathyroidism status post parathyroidectomy,HTN, HL and CAD. Sleep study 11/2013 was negative. Also has history of A fib, bipolar disorder  Pt in the advanced CHF clinic - She did a zio patch recently for palpitations- she is already on xarelto  She does not feel faint at all   covid 4th dose - recommended  shingrix done       Patient Active Problem List   Diagnosis Date Noted  . Osteopenia 12/02/2019  . AKI (acute kidney injury) ([place])   . CAP (community acquired pneumonia) 03/01/2019  . Atrial fibrillation with RVR ([place]) 02/28/2019  . Chronic kidney disease (CKD), stage III (moderate) ([place]) 01/14/2019  . Vitamin D deficiency 07/22/2018  . Complete rotator cuff tear or rupture of right shoulder, not specified as traumatic 02/27/2017  . Auditory hallucination 12/13/2016  . Gout 06/29/2016  . Type 2 diabetes mellitus ([place]) 12/31/2015  . Coronary artery dissection   . Lung nodule seen on imaging study 09/24/2014  . Paroxysmal atrial fibrillation ([place]) 09/11/2014  . Asthmatic bronchitis with exacerbation 05/25/2014  . Primary hyperparathyroidism ([place]) 04/15/2014  . Chronic diastolic heart failure ([place]) 10/02/2013  . Shortness of breath 08/31/2013  . Venous stasis dermatitis of both lower  extremities 08/19/2013  . Nocturia 06/12/2013  . Morbid obesity ([place]) 08/09/2012  . Urinary incontinence, urge 07/11/2012  . CAD (coronary artery disease) 10/31/2010  . NEOPLASM OF UNCERTAIN BEHAVIOR OF SKIN 02/11/2010  . Bipolar disorder ([place]) 09/14/2009  . ACUT MYOCARD INFARCT UNS SITE SUBSQT EPIS CARE 07/02/2008  . Hyperlipidemia 05/23/2008  . UNSPECIFIED DISORDER OF THYROID 02/04/2008  . HYPERTENSION, BENIGN ESSENTIAL 02/04/2008  . MYOCARDIAL INFARCTION, HX OF 03/05/2007    Past Medical History:  Diagnosis Date  . Anemia   . Anxiety   . Asthma   . Atrial fibrillation ([place])   . Bipolar disorder ([place])   . CAD (coronary artery disease)   . Cancer ([place])    basal cell carcinoma on right hand, papilloma left breast  . CHF (congestive heart failure) ([place])    pt seen at heart/vascular spec clinic 08/14/2014   . Chronic kidney disease   . Coarse tremors    hands  . Coronary artery disease   . Depression   . DVT (deep vein thrombosis) in pregnancy    pt. reports that it was post knee surgery, not during pregnancy  . Dyslipidemia   . Fall   . Fatty liver   . Headache    migraines - stopped after menopause  . Heart attack ([name]) 02/2007   non-q-wave with second septal perforator  . Hyperlipidemia   . Hypertension   . Hypothyroidism    history of took medication, has resolved  . Knee pain, left   .  Lower extremity deep venous thrombosis (HCC)    20 years ago   . Measles    hx of in childhood   . Morbid obesity with BMI of 40.0-44.9, adult ([place])   . Pain at surgical incision    Left breast from procedure 09/11/16  . Pneumonia    hx of   . PONV (postoperative nausea and vomiting)    also slow to wake up  . Psychiatric hospitalization 05/2008  . Shingles    20 years ago   . Shortness of breath dyspnea    exercise   . Type 2 diabetes mellitus ([place]) 12/31/2015  . Urinary incontinence   . UTI (urinary tract infection)     Past Surgical History:  Procedure Laterality  Date  . BASAL CELL CARCINOMA EXCISION    . BREAST EXCISIONAL BIOPSY Left   . BREAST LUMPECTOMY WITH RADIOACTIVE SEED LOCALIZATION Left 09/11/2016   Procedure: LEFT BREAST LUMPECTOMY WITH RADIOACTIVE SEED LOCALIZATION;  Surgeon: Excell Seltzer, MD;  Location: [facility];  Service: General;  Laterality: Left;  . BREAST MASS EXCISION     age 44, benign tumor  . CARDIAC CATHETERIZATION    . CARDIAC CATHETERIZATION N/A 12/30/2015   Procedure: Left Heart Cath and Coronary Angiography;  Surgeon: Belva Crome, MD;  Location: [facility];  Service: Cardiovascular;  Laterality: N/A;  . COLONOSCOPY  07/15/2009   [facility]  . DILATATION & CURETTAGE/HYSTEROSCOPY WITH MYOSURE N/A 09/22/2016   Procedure: DILATATION & CURETTAGE/HYSTEROSCOPY WITH MYOSURE;  Surgeon: Molli Posey, MD;  Location: [facility];  Service: Gynecology;  Laterality: N/A;  . DILATION AND CURETTAGE OF UTERUS    . KNEE SURGERY     right, removed cartilage  . PARATHYROIDECTOMY N/A 08/25/2014   Procedure: PARATHYROIDECTOMY;  Surgeon: Armandina Gemma, MD;  Location: [facility];  Service: General;  Laterality: N/A;  . SHOULDER ARTHROSCOPY WITH ROTATOR CUFF REPAIR AND SUBACROMIAL DECOMPRESSION Right 02/27/2017   Procedure: Right shoulder arthroscopic rotator cuff repair with biceps tenodesis and subacromial decompression;  Surgeon: Nicholes Stairs, MD;  Location: [facility];  Service: Orthopedics;  Laterality: Right;  150 mins  . TONSILLECTOMY    . TUBAL LIGATION      Social History   Tobacco Use  . Smoking status: Never Smoker  . Smokeless tobacco: Never Used  Vaping Use  . Vaping Use: Never used  Substance Use Topics  . Alcohol use: Yes    Alcohol/week: 0.0 standard drinks    Comment: occ.  . Drug use: No    Family History  Problem Relation Age of Onset  . Breast cancer Mother 26  . Alcohol abuse Mother   . Alzheimer's disease Mother   . Lung cancer Mother   . Alcohol abuse Father   . Alzheimer's disease Father   . Parkinson's  disease Father   . Alcohol abuse Maternal Grandfather   . Drug abuse Maternal Grandfather   . Alcohol abuse Maternal Grandmother   . Alcohol abuse Paternal Grandfather   . Alcohol abuse Paternal Grandmother   . Schizophrenia Cousin   . Diabetes Brother   . Hypertension Brother   . Colon cancer Neg Hx   . Esophageal cancer Neg Hx   . Rectal cancer Neg Hx   . Stomach cancer Neg Hx     Allergies  Allergen Reactions  . Eggs Or Egg-Derived Products Hives and Other (See Comments)    Can eat foods with cooked eggs; CANNOT handle when part of flu vaccine, etc.   .  Tetanus Toxoids     Vigorous local reaction to tdap with local pain and itching   . Lamictal [Lamotrigine] Hives and Rash    Medication list has been reviewed and updated.  Current Outpatient Medications on File Prior to Visit  Medication Sig Dispense Refill  . allopurinol (ZYLOPRIM) 100 MG tablet TAKE TWO TABLETS BY MOUTH DAILY 90 tablet 3  . benztropine (COGENTIN) 1 MG tablet Take 1 mg by mouth 2 (two) times daily.  2  . Biotin w/ Vitamins C & E (HAIR/SKIN/NAILS PO) Take by mouth.    . calcitRIOL (ROCALTROL) 0.25 MCG capsule Take 1 capsule (0.25 mcg total) by mouth daily. 180 capsule 0  . divalproex (DEPAKOTE ER) 500 MG 24 hr tablet Take 1,500 mg by mouth at bedtime.     . empagliflozin (JARDIANCE) 10 MG TABS tablet Take 1 tablet (10 mg total) by mouth daily. 90 tablet 0  . furosemide (LASIX) 80 MG tablet Take 80mg  in the morning and 40mg  in the afternoon 135 tablet 5  . LATUDA 60 MG TABS Take 60 mg by mouth at bedtime.     [name] levothyroxine (SYNTHROID) 50 MCG tablet Take 1 tablet (50 mcg total) by mouth daily before breakfast. 90 tablet 1  . lisinopril (ZESTRIL) 20 MG tablet Take 1 tablet (20 mg total) by mouth daily. 90 tablet 0  . metFORMIN (GLUCOPHAGE) 500 MG tablet TAKE ONE TABLET BY MOUTH TWICE A DAY WITH A MEAL 160 tablet 3  . methocarbamol (ROBAXIN) 500 MG tablet Take 1 tablet (500 mg total) by mouth every 8 (eight)  hours as needed for muscle spasms. 30 tablet 0  . metoprolol tartrate (LOPRESSOR) 25 MG tablet Take 1 tablet (25 mg total) by mouth 2 (two) times daily. 180 tablet 1  . nitroGLYCERIN (NITROSTAT) 0.4 MG SL tablet Place 1 tablet (0.4 mg total) under the tongue every 5 (five) minutes as needed for chest pain. For chest pain 25 tablet 3  . OLANZapine (ZYPREXA) 15 MG tablet Take 15 mg by mouth at bedtime.    . rivaroxaban (XARELTO) 20 MG TABS tablet Take 1 tablet (20 mg total) by mouth at bedtime. 90 tablet 3  . rosuvastatin (CRESTOR) 20 MG tablet Take 1 tablet (20 mg total) by mouth daily. 90 tablet 2  . UNABLE TO FIND Med Name: One Touch Verio blood glucose meter Lot #[id number] x Exp 01/22/2018    . levalbuterol (XOPENEX HFA) 45 MCG/ACT inhaler Inhale 2 puffs into the lungs every 4 (four) hours as needed for wheezing. 1 Inhaler 1   No current facility-administered medications on file prior to visit.    Review of Systems:  As per HPI- otherwise negative.   Physical Examination: Vitals:   05/31/20 1106  BP: 118/68  Pulse: 75  Resp: 19  Temp: (!) 97.5 F (36.4 C)  SpO2: 98%   Vitals:   05/31/20 1106  Weight: 300 lb (136.1 kg)  Height: 6' (1.829 m)   Body mass index is 40.69 kg/m. Ideal Body Weight: Weight in (lb) to have BMI = 25: 183.9  GEN: no acute distress.  Obese, looks well HEENT: Atraumatic, Normocephalic.  Ears and Nose: No external deformity. CV: RRR, No M/G/R. No JVD. No thrill. No extra heart sounds. PULM: CTA B, no wheezes, crackles, rhonchi. No retractions. No resp. distress. No accessory muscle use. EXTR: No c/c/e PSYCH: Normally interactive. Conversant.  Foot exam done today  There is a black line on the right pinky toe which appears to be blood  under the nail that is growing out.  Toenails are overgrown and need to be cut     Assessment and Plan: Acquired hypothyroidism  Type 2 diabetes mellitus without complication, unspecified whether long term insulin  use ([place])  HYPERTENSION, BENIGN ESSENTIAL  Morbid obesity ([place])  Contusion of toenail of right foot, initial encounter  Here today for follow-up visit.  Her labs are currently up-to-date Blood pressure under good control Last A1c showed good control of diabetes Discussed her concern about toenail.  She appears to have some blood under the right pinky toe, however it is growing out as would be expected.  I do not think this is a skin cancer.  I advised her to continue to monitor, expect this will grow around fully over the next few months.  If she does not notice the line moving she will contact me  Recommend COVID-19 fourth dose Visit planned for September This visit occurred during the SARS-CoV-2 public health emergency.  Safety protocols were in place, including screening questions prior to the visit, additional usage of staff PPE, and extensive cleaning of exam room while observing appropriate contact time as indicated for disinfecting solutions.    Signed Lamar Blinks, MD

## 2020-05-31 ENCOUNTER — Ambulatory Visit: Payer: BC Managed Care – PPO | Attending: Internal Medicine

## 2020-05-31 ENCOUNTER — Other Ambulatory Visit: Payer: Self-pay

## 2020-05-31 ENCOUNTER — Ambulatory Visit: Payer: BC Managed Care – PPO | Admitting: Family Medicine

## 2020-05-31 ENCOUNTER — Encounter: Payer: Self-pay | Admitting: Family Medicine

## 2020-05-31 VITALS — BP 118/68 | HR 75 | Temp 97.5°F | Resp 19 | Ht 72.0 in | Wt 300.0 lb

## 2020-05-31 DIAGNOSIS — Z23 Encounter for immunization: Secondary | ICD-10-CM

## 2020-05-31 DIAGNOSIS — I10 Essential (primary) hypertension: Secondary | ICD-10-CM

## 2020-05-31 DIAGNOSIS — S90221A Contusion of right lesser toe(s) with damage to nail, initial encounter: Secondary | ICD-10-CM

## 2020-05-31 DIAGNOSIS — E039 Hypothyroidism, unspecified: Secondary | ICD-10-CM | POA: Diagnosis not present

## 2020-05-31 DIAGNOSIS — E119 Type 2 diabetes mellitus without complications: Secondary | ICD-10-CM | POA: Diagnosis not present

## 2020-05-31 NOTE — Progress Notes (Signed)
   Covid-19 Vaccination Clinic  Name:  Kathleen [name]    MRN: [id number] DOB: 06-25-1955  05/31/2020  [name] was observed post Covid-19 immunization for 15 minutes without incident. She was provided with Vaccine Information Sheet and instruction to access the V-Safe system.   [name] was instructed to call 911 with any severe reactions post vaccine: [name] Difficulty breathing  . Swelling of face and throat  . A fast heartbeat  . A bad rash all over body  . Dizziness and weakness   Immunizations Administered    Name Date Dose VIS Date Route   PFIZER Comrnaty(Gray TOP) Covid-19 Vaccine 05/31/2020 11:29 AM 0.3 mL 01/01/2020 Intramuscular   Manufacturer: [place]   Lot: [id number]   NDC: [phone]

## 2020-06-04 ENCOUNTER — Other Ambulatory Visit (HOSPITAL_BASED_OUTPATIENT_CLINIC_OR_DEPARTMENT_OTHER): Payer: Self-pay

## 2020-06-04 MED ORDER — PFIZER-BIONT COVID-19 VAC-TRIS 30 MCG/0.3ML IM SUSP
INTRAMUSCULAR | 0 refills | Status: DC
  Filled 2020-06-04: qty 0.3, 1d supply, fill #0

## 2020-06-09 ENCOUNTER — Other Ambulatory Visit: Payer: Self-pay

## 2020-06-09 ENCOUNTER — Encounter (HOSPITAL_COMMUNITY): Payer: Self-pay | Admitting: Internal Medicine

## 2020-06-09 ENCOUNTER — Ambulatory Visit (HOSPITAL_COMMUNITY)
Admission: RE | Admit: 2020-06-09 | Discharge: 2020-06-09 | Disposition: A | Discharge status: Home / Self Care | Payer: BC Managed Care – PPO | Source: Ambulatory Visit | Attending: Internal Medicine | Admitting: Internal Medicine

## 2020-06-09 VITALS — BP 146/88 | HR 74 | Wt 304.6 lb

## 2020-06-09 DIAGNOSIS — I252 Old myocardial infarction: Secondary | ICD-10-CM | POA: Insufficient documentation

## 2020-06-09 DIAGNOSIS — E118 Type 2 diabetes mellitus with unspecified complications: Secondary | ICD-10-CM

## 2020-06-09 DIAGNOSIS — I2510 Atherosclerotic heart disease of native coronary artery without angina pectoris: Secondary | ICD-10-CM | POA: Diagnosis not present

## 2020-06-09 DIAGNOSIS — I10 Essential (primary) hypertension: Secondary | ICD-10-CM | POA: Diagnosis not present

## 2020-06-09 DIAGNOSIS — Z8249 Family history of ischemic heart disease and other diseases of the circulatory system: Secondary | ICD-10-CM | POA: Diagnosis not present

## 2020-06-09 DIAGNOSIS — Z79899 Other long term (current) drug therapy: Secondary | ICD-10-CM | POA: Diagnosis not present

## 2020-06-09 DIAGNOSIS — Z887 Allergy status to serum and vaccine status: Secondary | ICD-10-CM | POA: Insufficient documentation

## 2020-06-09 DIAGNOSIS — Z6841 Body Mass Index (BMI) 40.0 and over, adult: Secondary | ICD-10-CM | POA: Diagnosis not present

## 2020-06-09 DIAGNOSIS — Z7901 Long term (current) use of anticoagulants: Secondary | ICD-10-CM | POA: Diagnosis not present

## 2020-06-09 DIAGNOSIS — E1122 Type 2 diabetes mellitus with diabetic chronic kidney disease: Secondary | ICD-10-CM | POA: Insufficient documentation

## 2020-06-09 DIAGNOSIS — I480 Paroxysmal atrial fibrillation: Secondary | ICD-10-CM

## 2020-06-09 DIAGNOSIS — Z7984 Long term (current) use of oral hypoglycemic drugs: Secondary | ICD-10-CM | POA: Diagnosis not present

## 2020-06-09 DIAGNOSIS — I130 Hypertensive heart and chronic kidney disease with heart failure and stage 1 through stage 4 chronic kidney disease, or unspecified chronic kidney disease: Secondary | ICD-10-CM | POA: Diagnosis not present

## 2020-06-09 DIAGNOSIS — R0789 Other chest pain: Secondary | ICD-10-CM | POA: Insufficient documentation

## 2020-06-09 DIAGNOSIS — N1832 Chronic kidney disease, stage 3b: Secondary | ICD-10-CM | POA: Insufficient documentation

## 2020-06-09 DIAGNOSIS — I5032 Chronic diastolic (congestive) heart failure: Secondary | ICD-10-CM | POA: Diagnosis not present

## 2020-06-09 LAB — BASIC METABOLIC PANEL
Anion gap: 10 (ref 5–15)
BUN: 29 mg/dL — ABNORMAL HIGH (ref 8–23)
CO2: 29 mmol/L (ref 22–32)
Calcium: 8.9 mg/dL (ref 8.9–10.3)
Chloride: 101 mmol/L (ref 98–111)
Creatinine, Ser: 1.42 mg/dL — ABNORMAL HIGH (ref 0.44–1.00)
GFR, Estimated: 41 mL/min — ABNORMAL LOW (ref >60)
Glucose, Bld: 156 mg/dL — ABNORMAL HIGH (ref 70–99)
Potassium: 4.1 mmol/L (ref 3.5–5.1)
Sodium: 140 mmol/L (ref 135–145)

## 2020-06-09 LAB — CBC
HCT: 44.8 % (ref 36.0–46.0)
Hemoglobin: 14.6 g/dL (ref 12.0–15.0)
MCH: 29.9 pg (ref 26.0–34.0)
MCHC: 32.6 g/dL (ref 30.0–36.0)
MCV: 91.6 fL (ref 80.0–100.0)
Platelets: 150 10*3/uL (ref 150–400)
RBC: 4.89 MIL/uL (ref 3.87–5.11)
RDW: 14.4 % (ref 11.5–15.5)
WBC: 6.3 10*3/uL (ref 4.0–10.5)
nRBC: 0 % (ref 0.0–0.2)

## 2020-06-09 MED ORDER — ENTRESTO 49-51 MG PO TABS: 1.0000 | ORAL_TABLET | Freq: Two times a day (BID) | ORAL | 3 refills | Status: DC

## 2020-06-09 NOTE — Patient Instructions (Signed)
Good to see you! Please stop taking Lisinopril! Begin Entresto 49/51 mg --36 hours after stopping Lisinopril Lab work today --we will call you with any abnormal values. Please call our office scheduler for an appt in 6 months --November

## 2020-06-09 NOTE — Addendum Note (Signed)
Encounter addended by: Micki Riley, RN on: 06/09/2020 11:09 AM  Actions taken: Medication long-term status modified, Order list changed, Diagnosis association updated, Clinical Note Signed

## 2020-06-09 NOTE — Progress Notes (Signed)
ADVANCED HF CLINIC NOTE  Date:  06/09/2020   ID:  Kathleen [name], DOB [birth year], MRN [id number]  Location: Home  Provider location: [facility] Type of Visit: Established patient  PCP:  [name], Kathleen Filler, MD  Cardiologist:  Kathleen Bickers, MD Primary HF: Kathleen [name]  Chief Complaint: Heart Failure    History of Present Illness: 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 Sleep study 11/2013 was negative. Also has history of A fib.   Had NSTEMI in 2/09 had cath at that time with occlusion of septal perforator. Otherwise normal arteries. Was admitted for CP in 11/13. CE normal. Underwent dobutamine stress echo which was normal.   Admitted 09/11/14 for Afib RVR. She converted to sinus tach with dilt drip and was transitioned to Toprol-XL 25 mg daily. She was in NSR at the time of discharge. Discharge weight was 325 lb.  Admitted 7/12-7/14/17 with chest pain. CTA negative for PE. Troponins negative. Had Myoview 08/05/15 with "subtle" anterior wall defect.Felt tobe breast attenuation. Echo stable as below.   Holter monitor in 7/19: NSR with occasional PVCs and PACs. No other abnormalities. Multiple diary events which occasionally correspond to PVCs but often during NSR.  She was recently admitted 2/21 for atrial fibrillation w/ RVR in the setting of CAP and also w/ a/c diastolic HF, requiring IV diuretics. She was admitted by [facility] and general cardiology consulted. Echo repeated and EF 60-65%. RV ok. PNA treated w/ abx. AFib w/ Cardizem and metoprolol w/ spontaneous conversion back to NSR. She diuresed well w/ IV Lasix and transitioned back to PO Lasix. Hospital course also notable for AKI. SCr rose to 2.09 during admit but improved down to 1.46 on d/c. D/c wt 330 lb.  Today she returns for HF follow up.Overall feeling fine. Now joined a gym program called  "Too Fit to Quit". Going 4x/week for an hour. Denies SOB, orthopnea or PND. Weight up 4 pounds. BP usually 140/90    Past Medical History:  Diagnosis Date  . Anemia   . Anxiety   . Asthma   . Atrial fibrillation ([place])   . Bipolar disorder ([place])   . CAD (coronary artery disease)   . Cancer ([place])    basal cell carcinoma on right hand, papilloma left breast  . CHF (congestive heart failure) ([place])    pt seen at heart/vascular spec clinic 08/14/2014   . Chronic kidney disease   . Coarse tremors    hands  . Coronary artery disease   . Depression   . DVT (deep vein thrombosis) in pregnancy    pt. reports that it was post knee surgery, not during pregnancy  . Dyslipidemia   . Fall   . Fatty liver   . Headache    migraines - stopped after menopause  . Heart attack ([place]) 02/2007   non-q-wave with second septal perforator  . Hyperlipidemia   . Hypertension   . Hypothyroidism    history of took medication, has resolved  . Knee pain, left   . Lower extremity deep venous thrombosis (HCC)    20 years ago   . Measles    hx of in childhood   . Morbid obesity with BMI of 40.0-44.9, adult ([place])   . Pain at surgical incision    Left breast from procedure 09/11/16  . Pneumonia    hx of   .  PONV (postoperative nausea and vomiting)    also slow to wake up  . Psychiatric hospitalization 05/2008  . Shingles    20 years ago   . Shortness of breath dyspnea    exercise   . Type 2 diabetes mellitus ([place]) 12/31/2015  . Urinary incontinence   . UTI (urinary tract infection)    Past Surgical History:  Procedure Laterality Date  . BASAL CELL CARCINOMA EXCISION    . BREAST EXCISIONAL BIOPSY Left   . BREAST LUMPECTOMY WITH RADIOACTIVE SEED LOCALIZATION Left 09/11/2016   Procedure: LEFT BREAST LUMPECTOMY WITH RADIOACTIVE SEED LOCALIZATION;  Surgeon: Excell Seltzer, MD;  Location: [facility];  Service: General;  Laterality: Left;  . BREAST MASS EXCISION     age 89, benign tumor  . CARDIAC  CATHETERIZATION    . CARDIAC CATHETERIZATION N/A 12/30/2015   Procedure: Left Heart Cath and Coronary Angiography;  Surgeon: Belva Crome, MD;  Location: [facility];  Service: Cardiovascular;  Laterality: N/A;  . COLONOSCOPY  07/15/2009   [facility]  . DILATATION & CURETTAGE/HYSTEROSCOPY WITH MYOSURE N/A 09/22/2016   Procedure: DILATATION & CURETTAGE/HYSTEROSCOPY WITH MYOSURE;  Surgeon: Molli Posey, MD;  Location: [facility];  Service: Gynecology;  Laterality: N/A;  . DILATION AND CURETTAGE OF UTERUS    . KNEE SURGERY     right, removed cartilage  . PARATHYROIDECTOMY N/A 08/25/2014   Procedure: PARATHYROIDECTOMY;  Surgeon: Armandina Gemma, MD;  Location: [facility];  Service: General;  Laterality: N/A;  . SHOULDER ARTHROSCOPY WITH ROTATOR CUFF REPAIR AND SUBACROMIAL DECOMPRESSION Right 02/27/2017   Procedure: Right shoulder arthroscopic rotator cuff repair with biceps tenodesis and subacromial decompression;  Surgeon: Nicholes Stairs, MD;  Location: [facility];  Service: Orthopedics;  Laterality: Right;  150 mins  . TONSILLECTOMY    . TUBAL LIGATION       Current Outpatient Medications  Medication Sig Dispense Refill  . allopurinol (ZYLOPRIM) 100 MG tablet TAKE TWO TABLETS BY MOUTH DAILY 90 tablet 3  . benztropine (COGENTIN) 1 MG tablet Take 1 mg by mouth 2 (two) times daily.  2  . Biotin w/ Vitamins C & E (HAIR/SKIN/NAILS PO) Take by mouth.    . calcitRIOL (ROCALTROL) 0.25 MCG capsule Take 1 capsule (0.25 mcg total) by mouth daily. 180 capsule 0  . COVID-19 mRNA Vac-TriS, Pfizer, (PFIZER-BIONT COVID-19 VAC-TRIS) SUSP injection Inject into the muscle. 0.3 mL 0  . divalproex (DEPAKOTE ER) 500 MG 24 hr tablet Take 1,500 mg by mouth at bedtime.     . empagliflozin (JARDIANCE) 10 MG TABS tablet Take 1 tablet (10 mg total) by mouth daily. 90 tablet 0  . furosemide (LASIX) 80 MG tablet Take 80mg  in the morning and 40mg  in the afternoon 135 tablet 5  . LATUDA 60 MG TABS Take 60 mg by mouth at bedtime.      . levalbuterol (XOPENEX HFA) 45 MCG/ACT inhaler Inhale 2 puffs into the lungs every 4 (four) hours as needed for wheezing. 1 Inhaler 1  . levothyroxine (SYNTHROID) 50 MCG tablet Take 1 tablet (50 mcg total) by mouth daily before breakfast. 90 tablet 1  . lisinopril (ZESTRIL) 20 MG tablet Take 1 tablet (20 mg total) by mouth daily. 90 tablet 0  . metFORMIN (GLUCOPHAGE) 500 MG tablet TAKE ONE TABLET BY MOUTH TWICE A DAY WITH A MEAL 160 tablet 3  . methocarbamol (ROBAXIN) 500 MG tablet Take 1 tablet (500 mg total) by mouth every 8 (eight) hours as needed for muscle spasms. 30 tablet  0  . metoprolol tartrate (LOPRESSOR) 25 MG tablet Take 1 tablet (25 mg total) by mouth 2 (two) times daily. 180 tablet 1  . nitroGLYCERIN (NITROSTAT) 0.4 MG SL tablet Place 1 tablet (0.4 mg total) under the tongue every 5 (five) minutes as needed for chest pain. For chest pain 25 tablet 3  . OLANZapine (ZYPREXA) 15 MG tablet Take 15 mg by mouth at bedtime.    . rivaroxaban (XARELTO) 20 MG TABS tablet Take 1 tablet (20 mg total) by mouth at bedtime. 90 tablet 3  . rosuvastatin (CRESTOR) 20 MG tablet Take 1 tablet (20 mg total) by mouth daily. 90 tablet 2  . UNABLE TO FIND Med Name: One Touch Verio blood glucose meter Lot #[id number] x Exp 01/22/2018     No current facility-administered medications for this encounter.    Allergies:   Eggs or egg-derived products, Tetanus toxoids, and Lamictal [lamotrigine]   Social History:  The patient  reports that she has never smoked. She has never used smokeless tobacco. She reports current alcohol use. She reports that she does not use drugs.   Family History:  The patient's family history includes Alcohol abuse in her father, maternal grandfather, maternal grandmother, mother, paternal grandfather, and paternal grandmother; Alzheimer's disease in her father and mother; Breast cancer (age of onset: 76) in her mother; Diabetes in her brother; Drug abuse in her maternal grandfather;  Hypertension in her brother; Lung cancer in her mother; Parkinson's disease in her father; Schizophrenia in her cousin.   ROS:  Please see the history of present illness.   All other systems are personally reviewed and negative.  Vitals:   06/09/20 1035  BP: (!) 146/88  Pulse: 74  SpO2: 97%   Wt Readings from Last 3 Encounters:  05/31/20 136.1 kg (300 lb)  03/29/20 135.2 kg (298 lb)  12/08/19 (!) 136.6 kg (301 lb 3.2 oz)   General:  Well appearing. No resp difficulty HEENT: normal Neck: supple. no JVD. Carotids 2+ bilat; no bruits. No lymphadenopathy or thryomegaly appreciated. Cor: PMI nondisplaced. Regular rate & rhythm. No rubs, gallops or murmurs. Lungs: clear Abdomen: obese soft, nontender, nondistended. No hepatosplenomegaly. No bruits or masses. Good bowel sounds. Extremities: no cyanosis, clubbing, rash, edema Neuro: alert & orientedx3, cranial nerves grossly intact. moves all 4 extremities w/o difficulty. Affect pleasant  Recent Labs: 10/15/2019: ALT 18 03/29/2020: BUN 27; Creatinine, Ser 1.58; Hemoglobin 15.1; Platelets 137.0; Potassium 4.3; Pro B Natriuretic peptide (BNP) 32.0; Sodium 142; TSH 1.80  Personally reviewed   Wt Readings from Last 3 Encounters:  05/31/20 136.1 kg (300 lb)  03/29/20 135.2 kg (298 lb)  12/08/19 (!) 136.6 kg (301 lb 3.2 oz)      ASSESSMENT AND PLAN:  1. Chronic diastolic HF:  -ECHO 0/9381 EF Normal EF 55-60% Grade IDD  -Echo 7/19 EF 55-60%  -Echo 2/21 EF 60-65%, RV ok  - Stable NYHA II. Volume status looks pretty good.  - continue Lasix 80 mg qam/40 mg qpm - continue Jardiance 10 mg daily  - continue metoprolol 25 mg bid  - Switch lisinopril to Entresto 49/51 bid after 36 hour washout  2. PAF - Regular on exam.  - Continue metoprolol for rate control   - CHADSVASC = 3. - Continue Xarelto. No bleeding  3. Morbid obesity: -Body mass index is 41.31 kg/m. - Discussed portion control  4. CAD:  - No s.s ischemia - no ASA due  to Xarelto - continue statin   5. HTN:  -  BP mildly elevated. Will switch Lisinopril to Entresto  6. Snoring:  - No OSA on previous sleep study.   7. DM2 -ContinueJardiance 10 mg daily - Continue metformin  - 10/14/19 HgbA1c 6.5  8. CKD 3b - Continue jardiance - Follows with Dr. Posey Pronto   Signed, Kathleen Bickers, MD  06/09/2020 9:41 AM  Advanced Heart Failure [place] [address] [phone] (office) [phone] (fax)

## 2020-06-17 ENCOUNTER — Telehealth (HOSPITAL_COMMUNITY): Payer: Self-pay | Admitting: Pharmacy Technician

## 2020-06-17 NOTE — Telephone Encounter (Signed)
Patient Advocate Encounter   Received notification from [place] that prior authorization for Entresto (hefpef) is required.   PA submitted on CoverMyMeds Key [id number] Status is pending   Will continue to follow.

## 2020-06-21 ENCOUNTER — Other Ambulatory Visit: Payer: Self-pay | Admitting: Family Medicine

## 2020-06-21 DIAGNOSIS — E119 Type 2 diabetes mellitus without complications: Secondary | ICD-10-CM

## 2020-06-23 ENCOUNTER — Encounter (HOSPITAL_COMMUNITY): Payer: Self-pay

## 2020-07-15 ENCOUNTER — Encounter (HOSPITAL_COMMUNITY): Payer: Self-pay

## 2020-07-15 ENCOUNTER — Other Ambulatory Visit: Payer: Self-pay | Admitting: Family Medicine

## 2020-07-15 DIAGNOSIS — I10 Essential (primary) hypertension: Secondary | ICD-10-CM

## 2020-07-15 NOTE — Telephone Encounter (Signed)
Advanced Heart Failure Patient Advocate Encounter  Entresto PA denied. Working with pharmacist to submit an appeal for HEFPEF diagnosis.  Called the patient to see if she had any of her 30 day free supply remaining. Can offer samples during this time.  Charlann Boxer, CPhT

## 2020-07-15 NOTE — Progress Notes (Unsigned)
Medication Samples have been provided to the patient.  Drug name: Delene Loll       Strength: 49/51 mg        Qty: 2  LOT: [id number]  Exp.Date: 07/2022  Dosing instructions: Take 1 tablet Twice daily   The patient has been instructed regarding the correct time, dose, and frequency of taking this medication, including desired effects and most common side effects.   Juanita Laster Percival Glasheen 4:22 PM 07/15/2020

## 2020-07-16 ENCOUNTER — Other Ambulatory Visit (HOSPITAL_COMMUNITY): Payer: Self-pay | Admitting: Internal Medicine

## 2020-07-17 ENCOUNTER — Other Ambulatory Visit: Payer: Self-pay | Admitting: Family Medicine

## 2020-07-17 DIAGNOSIS — R7989 Other specified abnormal findings of blood chemistry: Secondary | ICD-10-CM

## 2020-07-20 NOTE — Telephone Encounter (Signed)
Appeal for Kathleen [name] has been submitted to [place] - faxed to [phone]  Pending redetermination

## 2020-07-27 ENCOUNTER — Encounter: Payer: Self-pay | Admitting: Family Medicine

## 2020-07-28 ENCOUNTER — Other Ambulatory Visit (HOSPITAL_COMMUNITY): Payer: Self-pay

## 2020-07-28 NOTE — Telephone Encounter (Signed)
Received Appeal denial from [place]. Rph spoke with Entresto rep in office and they advised patient could still utilize copay card until funds are depleted. Estimated yearly max is about $4000. Approximate cash price for 1 month is $795.87, would last about 6 months. Then we could try [employer] PAP.  Called patient to discuss, she will be transitioning to [place] in Sept. Patient states she has about 2 weeks of Entresto on hand.  We will send patient's copay card to Kathleen [name] and will attempt new PA when she transitions to Medicare.  Entresto copay card:  Kathleen [name]: [id number] PCN: [id number] Group: [id number] ID: [id number]  Provided copay card info to Kathleen [name], and copay card would not pay with out going through primary insurance. Rph sent message to [facility] rep to see what other options are available for patient.

## 2020-07-29 NOTE — Telephone Encounter (Signed)
Advanced Heart Failure Patient Advocate Encounter   Drug rep messaged clinic Rph back and advised patient assistance would be the only next option for the patient. Called patient and discussed going ahead to apply for [employer] patient assistance. Patient will stop by office to sign application tomorrow.  Printed and placed at check in desk. Please return signed patient portion to [place], [place]

## 2020-08-10 NOTE — Telephone Encounter (Signed)
Sent in [employer] application via fax.  Will follow up.  

## 2020-08-18 ENCOUNTER — Other Ambulatory Visit: Payer: Self-pay | Admitting: Family Medicine

## 2020-08-18 DIAGNOSIS — E039 Hypothyroidism, unspecified: Secondary | ICD-10-CM

## 2020-08-18 NOTE — Telephone Encounter (Signed)
Advanced Heart Failure Patient Advocate Encounter   Patient was approved to receive Entresto from [employer]  Patient ID: [id number] Effective dates: 08/12/20 through 01/22/21  Patient has spoken with [employer] and coordinated shipping medication.   Charlann Boxer, CPhT

## 2020-08-23 ENCOUNTER — Encounter: Payer: Self-pay | Admitting: Family Medicine

## 2020-08-26 NOTE — Progress Notes (Signed)
[facility] at [facility] [address], [place],  [address] [phone] [id number]  Date:  08/30/2020   Name:  Kathleen [name]   DOB:  [birth year]   MRN:  [id number]  PCP:  Darreld Mclean, MD    Chief Complaint: Back Pain (Going on for 2 weeks . Lower back area that shoots down the legs ) and right knee pain  (Just started today, )   History of Present Illness:  Kathleen [name] is a 65 y.o. very pleasant female patient who presents with the following:  Here today with concern of back pain Last seen by myself in May-   h/o morbid obesity, chronic diastolic HF, DM2, hypothyroidism, hyperparathyroidism status post parathyroidectomy, HTN, HL and CAD. Sleep study 11/2013 was negative. Also has history of A fib, bipolar disorder Followed by CHF clinic as well   She contacted me recently as follows: Dr. Lorelei Pont, I'm having back pain that's lasted for a week. I've tried alternating heating pad and ice. I've also been taking the methocarbomal and extra strength Tylenol that you gave me last time. Do you have any recommendations?  She notes that she went to sleep 2 weeks ago and woke up in pain It has gotten a little better She did not sleep in a different bed It feels like a "sharp knife cutting into me and slicing down my (right) leg" Sx can be on the left somewhat but the right is the main issue Her legs have felt weak No new bowel or bladder incontinence She has used ES tylenol and robaxin- this did not hep unfortunately  She is able to take prednisone - she has used this in the past    Wt Readings from Last 3 Encounters:  08/30/20 (!) 304 lb (137.9 kg)  06/09/20 (!) 304 lb 9.6 oz (138.2 kg)  05/31/20 300 lb (136.1 kg)    Lab Results  Component Value Date   HGBA1C 6.4 03/29/2020    Can give prevnar 20 Patient Active Problem List   Diagnosis Date Noted   Osteopenia 12/02/2019   AKI (acute kidney injury) ([place])    CAP  (community acquired pneumonia) 03/01/2019   Atrial fibrillation with RVR ([place]) 02/28/2019   Chronic kidney disease (CKD), stage III (moderate) ([place]) 01/14/2019   Vitamin D deficiency 07/22/2018   Complete rotator cuff tear or rupture of right shoulder, not specified as traumatic 02/27/2017   Auditory hallucination 12/13/2016   Gout 06/29/2016   Type 2 diabetes mellitus ([place]) 12/31/2015   Coronary artery dissection    Lung nodule seen on imaging study 09/24/2014   Paroxysmal atrial fibrillation ([place]) 09/11/2014   Asthmatic bronchitis with exacerbation 05/25/2014   Primary hyperparathyroidism ([place]) 04/15/2014   Chronic diastolic heart failure ([place]) 10/02/2013   Shortness of breath 08/31/2013   Venous stasis dermatitis of both lower extremities 08/19/2013   Nocturia 06/12/2013   Morbid obesity ([place]) 08/09/2012   Urinary incontinence, urge 07/11/2012   CAD (coronary artery disease) 10/31/2010   NEOPLASM OF UNCERTAIN BEHAVIOR OF SKIN 02/11/2010   Bipolar disorder ([place]) 09/14/2009   ACUT MYOCARD INFARCT UNS SITE SUBSQT EPIS CARE 07/02/2008   Hyperlipidemia 05/23/2008   UNSPECIFIED DISORDER OF THYROID 02/04/2008   HYPERTENSION, BENIGN ESSENTIAL 02/04/2008   MYOCARDIAL INFARCTION, HX OF 03/05/2007    Past Medical History:  Diagnosis Date   Anemia    Anxiety    Asthma    Atrial fibrillation ([place])    Bipolar  disorder ([place])    CAD (coronary artery disease)    Cancer (HCC)    basal cell carcinoma on right hand, papilloma left breast   CHF (congestive heart failure) ([place])    pt seen at heart/vascular spec clinic 08/14/2014    Chronic kidney disease    Coarse tremors    hands   Coronary artery disease    Depression    DVT (deep vein thrombosis) in pregnancy    pt. 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[place]) 02/2007   non-q-wave with second septal  perforator   Hyperlipidemia    Hypertension    Hypothyroidism    history of took medication, has resolved   Knee pain, left    Lower extremity deep venous thrombosis (HCC)    20 years ago    Measles    hx of in childhood    Morbid obesity with BMI of 40.0-44.9, adult ([place])    Pain at surgical incision    Left breast from procedure 09/11/16   Pneumonia    hx of    PONV (postoperative nausea and vomiting)    also slow to wake up   Psychiatric hospitalization 05/2008   Shingles    20 years ago    Shortness of breath dyspnea    exercise    Type 2 diabetes mellitus ([place]) 12/31/2015   Urinary incontinence    UTI (urinary tract infection)     Past Surgical History:  Procedure Laterality Date   BASAL CELL CARCINOMA EXCISION     BREAST EXCISIONAL BIOPSY Left    BREAST LUMPECTOMY WITH RADIOACTIVE SEED LOCALIZATION Left 09/11/2016   Procedure: LEFT BREAST LUMPECTOMY WITH RADIOACTIVE SEED LOCALIZATION;  Surgeon: Excell Seltzer, MD;  Location: [facility];  Service: General;  Laterality: Left;   BREAST MASS EXCISION     age 32, benign tumor   CARDIAC CATHETERIZATION     CARDIAC CATHETERIZATION N/A 12/30/2015   Procedure: Left Heart Cath and Coronary Angiography;  Surgeon: Belva Crome, MD;  Location: [facility];  Service: Cardiovascular;  Laterality: N/A;   COLONOSCOPY  07/15/2009   [facility]   DILATATION & CURETTAGE/HYSTEROSCOPY WITH MYOSURE N/A 09/22/2016   Procedure: DILATATION & CURETTAGE/HYSTEROSCOPY WITH MYOSURE;  Surgeon: Molli Posey, MD;  Location: [facility];  Service: Gynecology;  Laterality: N/A;   DILATION AND CURETTAGE OF UTERUS     KNEE SURGERY     right, removed cartilage   PARATHYROIDECTOMY N/A 08/25/2014   Procedure: PARATHYROIDECTOMY;  Surgeon: Armandina Gemma, MD;  Location: [facility];  Service: General;  Laterality: N/A;   SHOULDER ARTHROSCOPY WITH ROTATOR CUFF REPAIR AND SUBACROMIAL DECOMPRESSION Right 02/27/2017   Procedure: Right shoulder arthroscopic rotator cuff repair  with biceps tenodesis and subacromial decompression;  Surgeon: Nicholes Stairs, MD;  Location: [facility];  Service: Orthopedics;  Laterality: Right;  150 mins   TONSILLECTOMY     TUBAL LIGATION      Social History   Tobacco Use   Smoking status: Never   Smokeless tobacco: Never  Vaping Use   Vaping Use: Never used  Substance Use Topics   Alcohol use: Yes    Alcohol/week: 0.0 standard drinks    Comment: occ.   Drug use: No    Family History  Problem Relation Age of Onset   Breast cancer Mother  28   Alcohol abuse Mother    Alzheimer's disease Mother    Lung cancer Mother    Alcohol abuse Father    Alzheimer's disease Father    Parkinson's disease Father    Alcohol abuse Maternal Grandfather    Drug abuse Maternal Grandfather    Alcohol abuse Maternal Grandmother    Alcohol abuse Paternal Grandfather    Alcohol abuse Paternal Grandmother    Schizophrenia Cousin    Diabetes Brother    Hypertension Brother    Colon cancer Neg Hx    Esophageal cancer Neg Hx    Rectal cancer Neg Hx    Stomach cancer Neg Hx     Allergies  Allergen Reactions   Eggs Or Egg-Derived Products Hives and Other (See Comments)    Can eat foods with cooked eggs; CANNOT handle when part of flu vaccine, etc.    Tetanus Toxoids     Vigorous local reaction to tdap with local pain and itching    Lamictal [Lamotrigine] Hives and Rash    Medication list has been reviewed and updated.  Current Outpatient Medications on File Prior to Visit  Medication Sig Dispense Refill   allopurinol (ZYLOPRIM) 100 MG tablet TAKE TWO TABLETS BY MOUTH DAILY 90 tablet 3   benztropine (COGENTIN) 1 MG tablet Take 1 mg by mouth 2 (two) times daily.  2   Biotin w/ Vitamins C & E (HAIR/SKIN/NAILS PO) Take by mouth.     calcitRIOL (ROCALTROL) 0.25 MCG capsule TAKE ONE CAPSULE BY MOUTH DAILY 65 capsule 2   COVID-19 mRNA Vac-TriS, Pfizer, (PFIZER-BIONT COVID-19 VAC-TRIS) SUSP injection Inject into the muscle. 0.3 mL 0    divalproex (DEPAKOTE ER) 500 MG 24 hr tablet Take 1,500 mg by mouth at bedtime.      furosemide (LASIX) 80 MG tablet TAKE ONE TABLET IN THE MORNING AND TAKE 1/2 TABLET BY MOUTH IN THE AFTERNOON 210 tablet 0   JARDIANCE 10 MG TABS tablet TAKE ONE TABLET BY MOUTH DAILY 90 tablet 3   LATUDA 60 MG TABS Take 60 mg by mouth at bedtime.      levothyroxine (SYNTHROID) 50 MCG tablet Take 1 tablet (50 mcg total) by mouth daily before breakfast. 90 tablet 0   metFORMIN (GLUCOPHAGE) 500 MG tablet TAKE ONE TABLET BY MOUTH TWICE A DAY WITH A MEAL 160 tablet 3   metoprolol tartrate (LOPRESSOR) 25 MG tablet TAKE ONE TABLET BY MOUTH TWICE A DAY 180 tablet 1   nitroGLYCERIN (NITROSTAT) 0.4 MG SL tablet Place 1 tablet (0.4 mg total) under the tongue every 5 (five) minutes as needed for chest pain. For chest pain 25 tablet 3   OLANZapine (ZYPREXA) 15 MG tablet Take 15 mg by mouth at bedtime.     rivaroxaban (XARELTO) 20 MG TABS tablet Take 1 tablet (20 mg total) by mouth at bedtime. 90 tablet 3   rosuvastatin (CRESTOR) 20 MG tablet Take 1 tablet (20 mg total) by mouth daily. 90 tablet 2   sacubitril-valsartan (ENTRESTO) 49-51 MG Take 1 tablet by mouth 2 (two) times daily. 60 tablet 3   UNABLE TO FIND Med Name: One Touch Verio blood glucose meter Lot [id number] x Exp 01/22/2018     levalbuterol (XOPENEX HFA) 45 MCG/ACT inhaler Inhale 2 puffs into the lungs every 4 (four) hours as needed for wheezing. 1 Inhaler 1   No current facility-administered medications on file prior to visit.    Review of Systems:  As per HPI- otherwise negative.   Physical Examination:  Vitals:   08/30/20 1415  BP: 126/76  Pulse: 85  Temp: 98.1 F (36.7 C)  SpO2: 95%   Vitals:   08/30/20 1415  Weight: (!) 304 lb (137.9 kg)  Height: '5\' 11"'$  (1.803 m)   Body mass index is 42.4 kg/m. Ideal Body Weight: Weight in (lb) to have BMI = 25: 178.9  GEN: no acute distress.  Obese, tall build HEENT: Atraumatic, Normocephalic.  Ears  and Nose: No external deformity. CV: RRR, No M/G/R. No JVD. No thrill. No extra heart sounds. PULM: CTA B, no wheezes, crackles, rhonchi. No retractions. No resp. distress. No accessory muscle use. EXTR: No c/c/e PSYCH: Normally interactive. Conversant.  Foot exam:  defer due to pt back pain She notes tenderness over bilateral SI joints.  Lumbar flexion seems normal for age and state of health.  Extension is slightly decreased.  Normal strength and sensation in both lower extremities, negative straight leg raise  Assessment and Plan: Acute bilateral low back pain with right-sided sciatica - Plan: predniSONE (DELTASONE) 20 MG tablet, cyclobenzaprine (FLEXERIL) 10 MG tablet Nyarah is seen today with spontaneous onset lower back pain, some sciatica on the right side.  No known injury.  Symptoms for about 2 weeks, getting somewhat better. Will use Flexeril as needed for pain, caution regarding sedation Prescribed prednisone for possible pinched nerve.  I cautioned her this may raise her blood sugar.  We provided her with a blood sugar meter and instruction in its use.  I also cautioned her to watch for any change in her mood as she does have bipolar disorder  She is actually self-pay at the moment as her Medicare is not kicked in.  She prefers to defer any further treatment right now, but will let me know if things are not getting better in the next several days -sooner if worse  This visit occurred during the SARS-CoV-2 public health emergency.  Safety protocols were in place, including screening questions prior to the visit, additional usage of staff PPE, and extensive cleaning of exam room while observing appropriate contact time as indicated for disinfecting solutions.   Signed Lamar Blinks, MD

## 2020-08-30 ENCOUNTER — Encounter: Payer: Self-pay | Admitting: Family Medicine

## 2020-08-30 ENCOUNTER — Ambulatory Visit (INDEPENDENT_AMBULATORY_CARE_PROVIDER_SITE_OTHER): Payer: Medicare Other | Admitting: Family Medicine

## 2020-08-30 ENCOUNTER — Other Ambulatory Visit: Payer: Self-pay

## 2020-08-30 VITALS — BP 126/76 | HR 85 | Temp 98.1°F | Ht 71.0 in | Wt 304.0 lb

## 2020-08-30 DIAGNOSIS — E119 Type 2 diabetes mellitus without complications: Secondary | ICD-10-CM

## 2020-08-30 DIAGNOSIS — M5441 Lumbago with sciatica, right side: Secondary | ICD-10-CM

## 2020-08-30 MED ORDER — PREDNISONE 20 MG PO TABS: ORAL_TABLET | ORAL | 0 refills | Status: DC

## 2020-08-30 MED ORDER — CYCLOBENZAPRINE HCL 10 MG PO TABS: 10.0000 mg | ORAL_TABLET | Freq: Two times a day (BID) | ORAL | 0 refills | Status: DC | PRN

## 2020-08-30 NOTE — Patient Instructions (Signed)
It was good to see you today - I am sorry that your back is hurting you!  We will use prednisone and flexeril (muscle relaxer) as directed Please let me know if not improving in the next few days- Sooner if worse.   If your blood sugar is going higher than 300 or so please alert me.  Also watch for any mood change or sign of mania  Please let me know if your back is nor getting better soon

## 2020-09-27 NOTE — Patient Instructions (Addendum)
It was great to see you again today, I will be in touch for labs as it is possible.  Assuming they are normal, please see me in about 6 months You got your flu shot and pneumonia booster today  Please contact the [facility] about diabetic shoes for you Address: [address], [place], [place] [address] Phone: [phone]  You do have a callus under the lateral left foot.  You can try gently smoothing it out with an emery board if you like, but be very careful not to go through the skin   The new "bivalent" covid booster will be in pharmacies soon- I do recommend that you get this when you can I will send your Depakote level to Ms Ronnald Ramp for you

## 2020-09-27 NOTE — Progress Notes (Addendum)
[place] at [facility] [address], [place], [place] [address] [phone]  Date:  09/29/2020   Name:  Kathleen [name]   DOB:  05/25/1955   MRN:  [id number]  PCP:  Darreld Mclean, MD    Chief Complaint: Follow-up (6 month/Concerns/ questions: pt says she has a place on the L foot that she would like looked at today. /Flu shot: yes /)   History of Present Illness:  Kathleen [name] is a 65 y.o. very pleasant female patient who presents with the following:  Patient seen today for routine follow-up Most recent visit with myself was actually last month for back pain At that time she appeared to have spontaneous onset lower back pain and right-sided sciatica.  We used Flexeril and prednisone-at that time she was awaiting her Medicare plan to start, as we deferred any other health maintenance  She also notes an issue with her left foot- she wore some worn out shoes, and developed a painful spot on her pinky toe   Needs Depakote level today- sent to Pauline Good FNP at [facility] [phone] fax   Foot exam- today  Flu shot- give today  Mammogram- order for her today  Eye exam- appt next week  Needs Prevnar 20- give today  Shingrix is complete Most recent labs in May-BMP, CBC  Taking metformin 500 twice daily for her diabetes Patient Active Problem List   Diagnosis Date Noted   Osteopenia 12/02/2019   AKI (acute kidney injury) ([place])    CAP (community acquired pneumonia) 03/01/2019   Atrial fibrillation with RVR ([place]) 02/28/2019   Chronic kidney disease (CKD), stage III (moderate) ([place]) 01/14/2019   Vitamin D deficiency 07/22/2018   Complete rotator cuff tear or rupture of right shoulder, not specified as traumatic 02/27/2017   Auditory hallucination 12/13/2016   Gout 06/29/2016   Type 2 diabetes mellitus ([place]) 12/31/2015   Coronary artery dissection    Lung nodule seen on imaging study 09/24/2014   Paroxysmal  atrial fibrillation ([place]) 09/11/2014   Asthmatic bronchitis with exacerbation 05/25/2014   Primary hyperparathyroidism ([place]) 04/15/2014   Chronic diastolic heart failure ([employer]) 10/02/2013   Shortness of breath 08/31/2013   Venous stasis dermatitis of both lower extremities 08/19/2013   Nocturia 06/12/2013   Morbid obesity ([place]) 08/09/2012   Urinary incontinence, urge 07/11/2012   CAD (coronary artery disease) 10/31/2010   NEOPLASM OF UNCERTAIN BEHAVIOR OF SKIN 02/11/2010   Bipolar disorder ([place]) 09/14/2009   ACUT MYOCARD INFARCT UNS SITE SUBSQT EPIS CARE 07/02/2008   Hyperlipidemia 05/23/2008   UNSPECIFIED DISORDER OF THYROID 02/04/2008   HYPERTENSION, BENIGN ESSENTIAL 02/04/2008   MYOCARDIAL INFARCTION, HX OF 03/05/2007    Past Medical History:  Diagnosis Date   Anemia    Anxiety    Asthma    Atrial fibrillation ([place])    Bipolar disorder ([place])    CAD (coronary artery disease)    Cancer ([place])    basal cell carcinoma on right hand, papilloma left breast   CHF (congestive heart failure) ([place])    pt seen at heart/vascular spec clinic 08/14/2014    Chronic kidney disease    Coarse tremors    hands   Coronary artery disease    Depression    DVT (deep vein thrombosis) in pregnancy    pt. reports that it was post knee surgery, not during pregnancy   Dyslipidemia    Fall  Fatty liver    Headache    migraines - stopped after menopause   Heart attack ([place]) 02/2007   non-q-wave with second septal perforator   Hyperlipidemia    Hypertension    Hypothyroidism    history of took medication, has resolved   Knee pain, left    Lower extremity deep venous thrombosis (HCC)    20 years ago    Measles    hx of in childhood    Morbid obesity with BMI of 40.0-44.9, adult ([place])    Pain at surgical incision    Left breast from procedure 09/11/16   Pneumonia    hx of    PONV (postoperative nausea and vomiting)    also slow to wake up   Psychiatric hospitalization 05/2008   Shingles     20 years ago    Shortness of breath dyspnea    exercise    Type 2 diabetes mellitus ([place]) 12/31/2015   Urinary incontinence    UTI (urinary tract infection)     Past Surgical History:  Procedure Laterality Date   BASAL CELL CARCINOMA EXCISION     BREAST EXCISIONAL BIOPSY Left    BREAST LUMPECTOMY WITH RADIOACTIVE SEED LOCALIZATION Left 09/11/2016   Procedure: LEFT BREAST LUMPECTOMY WITH RADIOACTIVE SEED LOCALIZATION;  Surgeon: Excell Seltzer, MD;  Location: [place];  Service: General;  Laterality: Left;   BREAST MASS EXCISION     age 58, benign tumor   CARDIAC CATHETERIZATION     CARDIAC CATHETERIZATION N/A 12/30/2015   Procedure: Left Heart Cath and Coronary Angiography;  Surgeon: Belva Crome, MD;  Location: [place] [facility];  Service: Cardiovascular;  Laterality: N/A;   COLONOSCOPY  07/15/2009   [facility]   DILATATION & CURETTAGE/HYSTEROSCOPY WITH MYOSURE N/A 09/22/2016   Procedure: DILATATION & CURETTAGE/HYSTEROSCOPY WITH MYOSURE;  Surgeon: Molli Posey, MD;  Location: [facility];  Service: Gynecology;  Laterality: N/A;   DILATION AND CURETTAGE OF UTERUS     KNEE SURGERY     right, removed cartilage   PARATHYROIDECTOMY N/A 08/25/2014   Procedure: PARATHYROIDECTOMY;  Surgeon: Armandina Gemma, MD;  Location: [facility];  Service: General;  Laterality: N/A;   SHOULDER ARTHROSCOPY WITH ROTATOR CUFF REPAIR AND SUBACROMIAL DECOMPRESSION Right 02/27/2017   Procedure: Right shoulder arthroscopic rotator cuff repair with biceps tenodesis and subacromial decompression;  Surgeon: Nicholes Stairs, MD;  Location: [place];  Service: Orthopedics;  Laterality: Right;  150 mins   TONSILLECTOMY     TUBAL LIGATION      Social History   Tobacco Use   Smoking status: Never   Smokeless tobacco: Never  Vaping Use   Vaping Use: Never used  Substance Use Topics   Alcohol use: Yes    Alcohol/week: 0.0 standard drinks    Comment: occ.   Drug use: No    Family History  Problem Relation Age of Onset    Breast cancer Mother 76   Alcohol abuse Mother    Alzheimer's disease Mother    Lung cancer Mother    Alcohol abuse Father    Alzheimer's disease Father    Parkinson's disease Father    Alcohol abuse Maternal Grandfather    Drug abuse Maternal Grandfather    Alcohol abuse Maternal Grandmother    Alcohol abuse Paternal Grandfather    Alcohol abuse Paternal Grandmother    Schizophrenia Cousin    Diabetes Brother    Hypertension Brother    Colon cancer Neg Hx    Esophageal cancer Neg Hx  Rectal cancer Neg Hx    Stomach cancer Neg Hx     Allergies  Allergen Reactions   Eggs Or Egg-Derived Products Hives and Other (See Comments)    Can eat foods with cooked eggs; CANNOT handle when part of flu vaccine, etc.    Tetanus Toxoids     Vigorous local reaction to tdap with local pain and itching    Lamictal [Lamotrigine] Hives and Rash    Medication list has been reviewed and updated.  Current Outpatient Medications on File Prior to Visit  Medication Sig Dispense Refill   allopurinol (ZYLOPRIM) 100 MG tablet TAKE TWO TABLETS BY MOUTH DAILY 90 tablet 3   benztropine (COGENTIN) 1 MG tablet Take 1 mg by mouth 2 (two) times daily.  2   Biotin w/ Vitamins C & E (HAIR/SKIN/NAILS PO) Take by mouth.     calcitRIOL (ROCALTROL) 0.25 MCG capsule TAKE ONE CAPSULE BY MOUTH DAILY 65 capsule 2   divalproex (DEPAKOTE ER) 500 MG 24 hr tablet Take 1,500 mg by mouth at bedtime.      furosemide (LASIX) 80 MG tablet TAKE ONE TABLET IN THE MORNING AND TAKE 1/2 TABLET BY MOUTH IN THE AFTERNOON 210 tablet 0   JARDIANCE 10 MG TABS tablet TAKE ONE TABLET BY MOUTH DAILY 90 tablet 3   LATUDA 60 MG TABS Take 60 mg by mouth at bedtime.      levothyroxine (SYNTHROID) 50 MCG tablet Take 1 tablet (50 mcg total) by mouth daily before breakfast. 90 tablet 0   metFORMIN (GLUCOPHAGE) 500 MG tablet TAKE ONE TABLET BY MOUTH TWICE A DAY WITH A MEAL 160 tablet 3   metoprolol tartrate (LOPRESSOR) 25 MG tablet TAKE ONE  TABLET BY MOUTH TWICE A DAY 180 tablet 1   nitroGLYCERIN (NITROSTAT) 0.4 MG SL tablet Place 1 tablet (0.4 mg total) under the tongue every 5 (five) minutes as needed for chest pain. For chest pain 25 tablet 3   OLANZapine (ZYPREXA) 15 MG tablet Take 15 mg by mouth at bedtime.     rivaroxaban (XARELTO) 20 MG TABS tablet Take 1 tablet (20 mg total) by mouth at bedtime. 90 tablet 3   rosuvastatin (CRESTOR) 20 MG tablet Take 1 tablet (20 mg total) by mouth daily. 90 tablet 2   sacubitril-valsartan (ENTRESTO) 49-51 MG Take 1 tablet by mouth 2 (two) times daily. 60 tablet 3   UNABLE TO FIND Med Name: One Touch Verio blood glucose meter Lot [id number] x Exp 01/22/2018     levalbuterol (XOPENEX HFA) 45 MCG/ACT inhaler Inhale 2 puffs into the lungs every 4 (four) hours as needed for wheezing. 1 Inhaler 1   No current facility-administered medications on file prior to visit.    Review of Systems:  As per HPI- otherwise negative.   Physical Examination: Vitals:   09/29/20 1007  BP: 110/70  Pulse: 74  Resp: 18  Temp: 98.7 F (37.1 C)  SpO2: 97%   Vitals:   09/29/20 1007  Weight: (!) 304 lb 6.4 oz (138.1 kg)  Height: '5\' 11"'$  (1.803 m)   Body mass index is 42.46 kg/m. Ideal Body Weight: Weight in (lb) to have BMI = 25: 178.9  GEN: no acute distress. Obese, tall build,  looks well  HEENT: Atraumatic, Normocephalic.  Ears and Nose: No external deformity. CV: RRR, No M/G/R. No JVD. No thrill. No extra heart sounds. PULM: CTA B, no wheezes, crackles, rhonchi. No retractions. No resp. distress. No accessory muscle use. ABD: S, NT, ND, +BS. No  rebound. No HSM. EXTR: No c/c/e PSYCH: Normally interactive. Conversant.  Foot exam- today  Normal pulses and sensation She has a bunion on her left foot and a bunionette. Callus formation at the 5th MT head   Assessment and Plan: Type 2 diabetes mellitus without complication, unspecified whether long term insulin use (HCC) - Plan: Hemoglobin A1c,  Lipid panel  Acquired hypothyroidism - Plan: TSH  Essential hypertension, benign - Plan: CBC, Comprehensive metabolic panel  Morbid obesity (HCC)  Fatigue, unspecified type - Plan: TSH  Encounter for screening mammogram for malignant neoplasm of breast - Plan: MM 3D SCREEN BREAST BILATERAL  Medication monitoring encounter - Plan: Valproic Acid level  Need for influenza vaccination - Plan: Flu Vaccine QUAD High Dose(Fluad)  Need for pneumococcal vaccination - Plan: Pneumococcal conjugate vaccine 20-valent (Prevnar 20)  Foot callus  Patient seen today for follow up.  Updated her flu shot and pneumococcal vaccines.  Discussed COVID-19 booster Labs are pending as above- Will plan further follow- up pending labs. Noted callus on her left foot which is uncomfortable.  Discussed how to care for this problem. Asked her to contact the [place] clinic about diabetic shoes   Needs Depakote level today- send to Pauline Good FNP at [facility] [phone] fax  This visit occurred during the SARS-CoV-2 public health emergency.  Safety protocols were in place, including screening questions prior to the visit, additional usage of staff PPE, and extensive cleaning of exam room while observing appropriate contact time as indicated for disinfecting solutions.   Signed Lamar Blinks, MD  Received labs as below, message to patient  Results for orders placed or performed in visit on 09/29/20  CBC  Result Value Ref Range   WBC 4.9 4.0 - 10.5 K/uL   RBC 4.92 3.87 - 5.11 Mil/uL   Platelets 128.0 (L) 150.0 - 400.0 K/uL   Hemoglobin 14.8 12.0 - 15.0 g/dL   HCT 45.2 36.0 - 46.0 %   MCV 91.9 78.0 - 100.0 fl   MCHC 32.7 30.0 - 36.0 g/dL   RDW 15.5 11.5 - 15.5 %  Comprehensive metabolic panel  Result Value Ref Range   Sodium 141 135 - 145 mEq/L   Potassium 4.3 3.5 - 5.1 mEq/L   Chloride 100 96 - 112 mEq/L   CO2 29 19 - 32 mEq/L   Glucose, Bld 127 (H) 70 - 99 mg/dL   BUN 28 (H) 6 - 23  mg/dL   Creatinine, Ser 1.10 0.40 - 1.20 mg/dL   Total Bilirubin 0.5 0.2 - 1.2 mg/dL   Alkaline Phosphatase 95 39 - 117 U/L   AST 15 0 - 37 U/L   ALT 14 0 - 35 U/L   Total Protein 6.3 6.0 - 8.3 g/dL   Albumin 4.0 3.5 - 5.2 g/dL   GFR 52.91 (L) >60.00 mL/min   Calcium 9.1 8.4 - 10.5 mg/dL  Hemoglobin A1c  Result Value Ref Range   Hgb A1c MFr Bld 7.0 (H) 4.6 - 6.5 %  Lipid panel  Result Value Ref Range   Cholesterol 138 0 - 200 mg/dL   Triglycerides 124.0 0.0 - 149.0 mg/dL   HDL 44.60 >39.00 mg/dL   VLDL 24.8 0.0 - 40.0 mg/dL   LDL Cholesterol 69 0 - 99 mg/dL   Total CHOL/HDL Ratio 3    NonHDL 93.55   TSH  Result Value Ref Range   TSH 3.04 0.35 - 5.50 uIU/mL  Valproic Acid level  Result Value Ref Range  Valproic Acid Lvl 107.1 (H) 50.0 - 100.0 mg/L   Addnd 9/8- received her Depakote level which is slightly elevated  Called pt to let her know and will fax level to her mental health care provider. She is calling the prescribing provider now

## 2020-09-29 ENCOUNTER — Ambulatory Visit (INDEPENDENT_AMBULATORY_CARE_PROVIDER_SITE_OTHER): Payer: Medicare Other | Admitting: Family Medicine

## 2020-09-29 ENCOUNTER — Other Ambulatory Visit: Payer: Self-pay

## 2020-09-29 ENCOUNTER — Encounter: Payer: Self-pay | Admitting: Family Medicine

## 2020-09-29 VITALS — BP 110/70 | HR 74 | Temp 98.7°F | Resp 18 | Ht 71.0 in | Wt 304.4 lb

## 2020-09-29 DIAGNOSIS — Z23 Encounter for immunization: Secondary | ICD-10-CM

## 2020-09-29 DIAGNOSIS — E119 Type 2 diabetes mellitus without complications: Secondary | ICD-10-CM

## 2020-09-29 DIAGNOSIS — R5383 Other fatigue: Secondary | ICD-10-CM | POA: Diagnosis not present

## 2020-09-29 DIAGNOSIS — Z1231 Encounter for screening mammogram for malignant neoplasm of breast: Secondary | ICD-10-CM

## 2020-09-29 DIAGNOSIS — E039 Hypothyroidism, unspecified: Secondary | ICD-10-CM

## 2020-09-29 DIAGNOSIS — Z5181 Encounter for therapeutic drug level monitoring: Secondary | ICD-10-CM

## 2020-09-29 DIAGNOSIS — I10 Essential (primary) hypertension: Secondary | ICD-10-CM | POA: Diagnosis not present

## 2020-09-29 DIAGNOSIS — L84 Corns and callosities: Secondary | ICD-10-CM

## 2020-09-29 LAB — CBC
HCT: 45.2 % (ref 36.0–46.0)
Hemoglobin: 14.8 g/dL (ref 12.0–15.0)
MCHC: 32.7 g/dL (ref 30.0–36.0)
MCV: 91.9 fl (ref 78.0–100.0)
Platelets: 128 10*3/uL — ABNORMAL LOW (ref 150.0–400.0)
RBC: 4.92 Mil/uL (ref 3.87–5.11)
RDW: 15.5 % (ref 11.5–15.5)
WBC: 4.9 10*3/uL (ref 4.0–10.5)

## 2020-09-29 LAB — COMPREHENSIVE METABOLIC PANEL
ALT: 14 U/L (ref 0–35)
AST: 15 U/L (ref 0–37)
Albumin: 4 g/dL (ref 3.5–5.2)
Alkaline Phosphatase: 95 U/L (ref 39–117)
BUN: 28 mg/dL — ABNORMAL HIGH (ref 6–23)
CO2: 29 mEq/L (ref 19–32)
Calcium: 9.1 mg/dL (ref 8.4–10.5)
Chloride: 100 mEq/L (ref 96–112)
Creatinine, Ser: 1.1 mg/dL (ref 0.40–1.20)
GFR: 52.91 mL/min — ABNORMAL LOW (ref >60.00)
Glucose, Bld: 127 mg/dL — ABNORMAL HIGH (ref 70–99)
Potassium: 4.3 mEq/L (ref 3.5–5.1)
Sodium: 141 mEq/L (ref 135–145)
Total Bilirubin: 0.5 mg/dL (ref 0.2–1.2)
Total Protein: 6.3 g/dL (ref 6.0–8.3)

## 2020-09-29 LAB — HEMOGLOBIN A1C: Hgb A1c MFr Bld: 7 % — ABNORMAL HIGH (ref 4.6–6.5)

## 2020-09-29 LAB — LIPID PANEL
Cholesterol: 138 mg/dL (ref 0–200)
HDL: 44.6 mg/dL (ref >39.00)
LDL Cholesterol: 69 mg/dL (ref 0–99)
NonHDL: 93.55
Total CHOL/HDL Ratio: 3
Triglycerides: 124 mg/dL (ref 0.0–149.0)
VLDL: 24.8 mg/dL (ref 0.0–40.0)

## 2020-09-29 LAB — TSH: TSH: 3.04 u[IU]/mL (ref 0.35–5.50)

## 2020-09-30 LAB — VALPROIC ACID LEVEL: Valproic Acid Lvl: 107.1 mg/L — ABNORMAL HIGH (ref 50.0–100.0)

## 2020-10-08 ENCOUNTER — Other Ambulatory Visit (HOSPITAL_COMMUNITY): Payer: Self-pay | Admitting: Internal Medicine

## 2020-10-08 ENCOUNTER — Other Ambulatory Visit: Payer: Self-pay | Admitting: Family Medicine

## 2020-10-10 ENCOUNTER — Encounter: Payer: Self-pay | Admitting: Family Medicine

## 2020-10-11 ENCOUNTER — Other Ambulatory Visit (HOSPITAL_COMMUNITY): Payer: Self-pay | Admitting: Internal Medicine

## 2020-10-11 ENCOUNTER — Other Ambulatory Visit: Payer: Self-pay | Admitting: Family Medicine

## 2020-10-11 LAB — COMPREHENSIVE METABOLIC PANEL: GFR calc non Af Amer: 46

## 2020-10-11 LAB — PROTIME-INR: Protime: 59 — AB (ref 10.0–13.8)

## 2020-10-11 LAB — BASIC METABOLIC PANEL
BUN: 28 — AB (ref 4–21)
Creatinine: 1.3 — AB (ref 0.5–1.1)

## 2020-10-11 LAB — CBC AND DIFFERENTIAL
HCT: 45 (ref 36–46)
Hemoglobin: 15 (ref 12.0–16.0)
Platelets: 155 (ref 150–399)
WBC: 4.4

## 2020-10-13 ENCOUNTER — Other Ambulatory Visit: Payer: Self-pay

## 2020-10-13 ENCOUNTER — Ambulatory Visit (INDEPENDENT_AMBULATORY_CARE_PROVIDER_SITE_OTHER): Payer: Medicare Other | Admitting: Family Medicine

## 2020-10-13 ENCOUNTER — Ambulatory Visit: Payer: Medicare Other | Attending: Family Medicine

## 2020-10-13 VITALS — BP 120/60 | HR 87 | Temp 97.6°F | Resp 18 | Ht 71.0 in | Wt 303.2 lb

## 2020-10-13 DIAGNOSIS — Z23 Encounter for immunization: Secondary | ICD-10-CM

## 2020-10-13 DIAGNOSIS — R600 Localized edema: Secondary | ICD-10-CM | POA: Diagnosis not present

## 2020-10-13 NOTE — Patient Instructions (Signed)
We put on an Unna-boot wrap today Leave in place or 2-3 days; then you can remove.  Let me know how it works for you  If it ever feels too tight or uncomfortable take it off!    If you like the unna-boot you can order some online to make sure you always have one on hand in case we need one for you

## 2020-10-13 NOTE — Progress Notes (Signed)
.    Covid-19 Vaccination Clinic  Name:  Kathleen [name]    MRN: [id number] DOB: 06/27/1955  10/13/2020  Kathleen [name] was observed post Covid-19 immunization for 15 minutes without incident. She was provided with Vaccine Information Sheet and instruction to access the V-Safe system.   Kathleen [name] was instructed to call [phone] with any severe reactions post vaccine: Difficulty breathing  Swelling of face and throat  A fast heartbeat  A bad rash all over body  Dizziness and weakness

## 2020-10-13 NOTE — Progress Notes (Signed)
[place] at [facility] [address] [place], [place] [address] [phone]  Date:  10/13/2020   Name:  Kathleen [name]   DOB:  10-16-1955   MRN:  [id number]  PCP:  Darreld Mclean, MD    Chief Complaint: Blister (Pt states she has developed blisters on the venous stasis patch on her left leg. One blister has bursted, and has some smaller ones now.)   History of Present Illness:  Kathleen [name] is a 65 y.o. very pleasant female patient who presents with the following:  Pt seen today for concern of weeping of her leg-see my chart message and photo dated 9/18 She does not have history of CHF but her weight has been stable, no shortness of breath  Last visit with myself earlier this month She has chronic venous stasis and developed weeping of her right shin area on Sunday- today is Wednesday NKI She otherwise feels well- normal  One of her blisters already burst, 1 is still present The right leg is at baseline   Wt Readings from Last 3 Encounters:  10/13/20 (!) 303 lb 3.2 oz (137.5 kg)  09/29/20 (!) 304 lb 6.4 oz (138.1 kg)  08/30/20 (!) 304 lb (137.9 kg)     Patient Active Problem List   Diagnosis Date Noted   Osteopenia 12/02/2019   AKI (acute kidney injury) ([place])    CAP (community acquired pneumonia) 03/01/2019   Atrial fibrillation with RVR ([place]) 02/28/2019   Chronic kidney disease (CKD), stage III (moderate) ([place]) 01/14/2019   Vitamin D deficiency 07/22/2018   Complete rotator cuff tear or rupture of right shoulder, not specified as traumatic 02/27/2017   Auditory hallucination 12/13/2016   Gout 06/29/2016   Type 2 diabetes mellitus ([place]) 12/31/2015   Coronary artery dissection    Lung nodule seen on imaging study 09/24/2014   Paroxysmal atrial fibrillation ([place]) 09/11/2014   Asthmatic bronchitis with exacerbation 05/25/2014   Primary hyperparathyroidism ([place]) 04/15/2014   Chronic diastolic heart failure  ([place]) 10/02/2013   Shortness of breath 08/31/2013   Venous stasis dermatitis of both lower extremities 08/19/2013   Nocturia 06/12/2013   Morbid obesity ([place]) 08/09/2012   Urinary incontinence, urge 07/11/2012   CAD (coronary artery disease) 10/31/2010   NEOPLASM OF UNCERTAIN BEHAVIOR OF SKIN 02/11/2010   Bipolar disorder ([place]) 09/14/2009   ACUT MYOCARD INFARCT UNS SITE SUBSQT EPIS CARE 07/02/2008   Hyperlipidemia 05/23/2008   UNSPECIFIED DISORDER OF THYROID 02/04/2008   HYPERTENSION, BENIGN ESSENTIAL 02/04/2008   MYOCARDIAL INFARCTION, HX OF 03/05/2007    Past Medical History:  Diagnosis Date   Anemia    Anxiety    Asthma    Atrial fibrillation ([place])    Bipolar disorder ([place])    CAD (coronary artery disease)    Cancer ([place])    basal cell carcinoma on right hand, papilloma left breast   CHF (congestive heart failure) ([place])    pt seen at heart/vascular spec clinic 08/14/2014    Chronic kidney disease    Coarse tremors    hands   Coronary artery disease    Depression    DVT (deep vein thrombosis) in pregnancy    pt. 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[place]) 02/2007   non-q-wave with second septal  perforator   Hyperlipidemia    Hypertension    Hypothyroidism    history of took medication, has resolved   Knee pain, left    Lower extremity deep venous thrombosis (HCC)    20 years ago    Measles    hx of in childhood    Morbid obesity with BMI of 40.0-44.9, adult (HCC)    Pain at surgical incision    Left breast from procedure 09/11/16   Pneumonia    hx of    PONV (postoperative nausea and vomiting)    also slow to wake up   Psychiatric hospitalization 05/2008   Shingles    20 years ago    Shortness of breath dyspnea    exercise    Type 2 diabetes mellitus ([place]) 12/31/2015   Urinary incontinence    UTI (urinary tract infection)      Past Surgical History:  Procedure Laterality Date   BASAL CELL CARCINOMA EXCISION     BREAST EXCISIONAL BIOPSY Left    BREAST LUMPECTOMY WITH RADIOACTIVE SEED LOCALIZATION Left 09/11/2016   Procedure: LEFT BREAST LUMPECTOMY WITH RADIOACTIVE SEED LOCALIZATION;  Surgeon: Excell Seltzer, MD;  Location: [place];  Service: General;  Laterality: Left;   BREAST MASS EXCISION     age 47, benign tumor   CARDIAC CATHETERIZATION     CARDIAC CATHETERIZATION N/A 12/30/2015   Procedure: Left Heart Cath and Coronary Angiography;  Surgeon: Belva Crome, MD;  Location: [facility];  Service: Cardiovascular;  Laterality: N/A;   COLONOSCOPY  07/15/2009   [facility]   DILATATION & CURETTAGE/HYSTEROSCOPY WITH MYOSURE N/A 09/22/2016   Procedure: DILATATION & CURETTAGE/HYSTEROSCOPY WITH MYOSURE;  Surgeon: Molli Posey, MD;  Location: [facility];  Service: Gynecology;  Laterality: N/A;   DILATION AND CURETTAGE OF UTERUS     KNEE SURGERY     right, removed cartilage   PARATHYROIDECTOMY N/A 08/25/2014   Procedure: PARATHYROIDECTOMY;  Surgeon: Armandina Gemma, MD;  [facility];  Service: General;  Laterality: N/A;   SHOULDER ARTHROSCOPY WITH ROTATOR CUFF REPAIR AND SUBACROMIAL DECOMPRESSION Right 02/27/2017   Procedure: Right shoulder arthroscopic rotator cuff repair with biceps tenodesis and subacromial decompression;  Surgeon: Nicholes Stairs, MD;  Location: [facility];  Service: Orthopedics;  Laterality: Right;  150 mins   TONSILLECTOMY     TUBAL LIGATION      Social History   Tobacco Use   Smoking status: Never   Smokeless tobacco: Never  Vaping Use   Vaping Use: Never used  Substance Use Topics   Alcohol use: Yes    Alcohol/week: 0.0 standard drinks    Comment: occ.   Drug use: No    Family History  Problem Relation Age of Onset   Breast cancer Mother 82   Alcohol abuse Mother    Alzheimer's disease Mother    Lung cancer Mother    Alcohol abuse Father    Alzheimer's disease Father     Parkinson's disease Father    Alcohol abuse Maternal Grandfather    Drug abuse Maternal Grandfather    Alcohol abuse Maternal Grandmother    Alcohol abuse Paternal Grandfather    Alcohol abuse Paternal Grandmother    Schizophrenia Cousin    Diabetes Brother    Hypertension Brother    Colon cancer Neg Hx    Esophageal cancer Neg Hx    Rectal cancer Neg Hx    Stomach cancer Neg Hx     Allergies  Allergen Reactions   Eggs Or  Egg-Derived [employer] and Other (See Comments)    Can eat foods with cooked eggs; CANNOT handle when part of flu vaccine, etc.    Tetanus Toxoids     Vigorous local reaction to tdap with local pain and itching    Lamictal [Lamotrigine] Hives and Rash    Medication list has been reviewed and updated.  Current Outpatient Medications on File Prior to Visit  Medication Sig Dispense Refill   allopurinol (ZYLOPRIM) 100 MG tablet TAKE TWO TABLETS BY MOUTH DAILY 90 tablet 3   benztropine (COGENTIN) 1 MG tablet Take 1 mg by mouth 2 (two) times daily.  2   Biotin w/ Vitamins C & E (HAIR/SKIN/NAILS PO) Take by mouth.     calcitRIOL (ROCALTROL) 0.25 MCG capsule TAKE ONE CAPSULE BY MOUTH DAILY 65 capsule 2   divalproex (DEPAKOTE ER) 500 MG 24 hr tablet Take 1,500 mg by mouth at bedtime.      furosemide (LASIX) 80 MG tablet TAKE ONE TABLET IN THE MORNING AND TAKE 1/2 TABLET BY MOUTH IN THE AFTERNOON 135 tablet 3   JARDIANCE 10 MG TABS tablet TAKE ONE TABLET BY MOUTH DAILY 90 tablet 3   LATUDA 60 MG TABS Take 60 mg by mouth at bedtime.      levothyroxine (SYNTHROID) 50 MCG tablet Take 1 tablet (50 mcg total) by mouth daily before breakfast. 90 tablet 0   lisinopril (ZESTRIL) 20 MG tablet TAKE ONE TABLET BY MOUTH DAILY 90 tablet 1   metFORMIN (GLUCOPHAGE) 500 MG tablet TAKE ONE TABLET BY MOUTH TWICE A DAY WITH A MEAL 160 tablet 3   metoprolol tartrate (LOPRESSOR) 25 MG tablet TAKE ONE TABLET BY MOUTH TWICE A DAY 180 tablet 1   nitroGLYCERIN (NITROSTAT) 0.4 MG SL tablet  Place 1 tablet (0.4 mg total) under the tongue every 5 (five) minutes as needed for chest pain. For chest pain 25 tablet 3   OLANZapine (ZYPREXA) 15 MG tablet Take 15 mg by mouth at bedtime.     rosuvastatin (CRESTOR) 20 MG tablet Take 1 tablet (20 mg total) by mouth daily. 90 tablet 2   sacubitril-valsartan (ENTRESTO) 49-51 MG Take 1 tablet by mouth 2 (two) times daily. 60 tablet 3   UNABLE TO FIND Med Name: One Touch Verio blood glucose meter Lot #[id number] x Exp 01/22/2018     XARELTO 20 MG TABS tablet TAKE ONE TABLET BY MOUTH AT BEDTIME 85 tablet 3   levalbuterol (XOPENEX HFA) 45 MCG/ACT inhaler Inhale 2 puffs into the lungs every 4 (four) hours as needed for wheezing. 1 Inhaler 1   No current facility-administered medications on file prior to visit.    Review of Systems:  As per HPI- otherwise negative.   Physical Examination: Vitals:   10/13/20 1007  BP: 120/60  Pulse: 87  Resp: 18  Temp: 97.6 F (36.4 C)  SpO2: 96%   Vitals:   10/13/20 1007  Weight: (!) 303 lb 3.2 oz (137.5 kg)  Height: 5\' 11"  (1.803 m)   Body mass index is 42.29 kg/m. Ideal Body Weight: Weight in (lb) to have BMI = 25: 178.9  GEN: no acute distress.  Looks well, obese/tall build.  Appears her normal self HEENT: Atraumatic, Normocephalic.  Ears and Nose: No external deformity. CV: RRR, No M/G/R. No JVD. No thrill. No extra heart sounds. PULM: CTA B, no wheezes, crackles, rhonchi. No retractions. No resp. distress. No accessory muscle use. EXTR: No c/c PSYCH: Normally interactive. Conversant.  Left leg: Chronic changes of venous  stasis with 1 fluid-filled blister.  No other active weeping at this time [place]la pulse in left foot She has mild edema of her feet and lower extremities, but no more than trace  Applied Unna boot, covered with gauze and Ace bandage  Assessment and Plan: Lower extremity edema  Patient seen today with some weeping and blister formation over some chronic venous stasis of  her lower extremity.  We decided to try an Unna boot and attempt to dry this area out.  Production assistant, radio and gave instructions for how to care for her leg.  She will contact me if not getting better or if any other concerns  Signed Lamar Blinks, MD

## 2020-10-19 ENCOUNTER — Other Ambulatory Visit (HOSPITAL_BASED_OUTPATIENT_CLINIC_OR_DEPARTMENT_OTHER): Payer: Self-pay

## 2020-10-19 MED ORDER — COVID-19MRNA BIVAL VACC PFIZER 30 MCG/0.3ML IM SUSP
INTRAMUSCULAR | 0 refills | Status: DC
  Filled 2020-10-19: qty 0.3, 1d supply, fill #0

## 2020-11-02 ENCOUNTER — Encounter: Payer: Self-pay | Admitting: Family Medicine

## 2020-11-09 ENCOUNTER — Encounter: Payer: Self-pay | Admitting: Family Medicine

## 2020-11-09 ENCOUNTER — Ambulatory Visit: Payer: Medicare Other

## 2020-11-09 DIAGNOSIS — I872 Venous insufficiency (chronic) (peripheral): Secondary | ICD-10-CM

## 2020-11-11 LAB — HM DIABETES EYE EXAM

## 2020-11-17 ENCOUNTER — Other Ambulatory Visit: Payer: Self-pay | Admitting: Family Medicine

## 2020-11-26 ENCOUNTER — Encounter (HOSPITAL_BASED_OUTPATIENT_CLINIC_OR_DEPARTMENT_OTHER): Payer: Medicare Other | Admitting: Internal Medicine

## 2020-12-03 ENCOUNTER — Encounter: Payer: Self-pay | Admitting: Family Medicine

## 2020-12-03 DIAGNOSIS — U071 COVID-19: Secondary | ICD-10-CM

## 2020-12-04 MED ORDER — MOLNUPIRAVIR EUA 200MG CAPSULE: 4.0000 | ORAL_CAPSULE | Freq: Two times a day (BID) | ORAL | 0 refills | Status: AC

## 2020-12-07 ENCOUNTER — Ambulatory Visit: Payer: Medicare Other

## 2020-12-15 ENCOUNTER — Other Ambulatory Visit (HOSPITAL_COMMUNITY): Payer: Self-pay

## 2020-12-15 ENCOUNTER — Telehealth (HOSPITAL_COMMUNITY): Payer: Self-pay | Admitting: Pharmacy Technician

## 2020-12-15 ENCOUNTER — Ambulatory Visit (HOSPITAL_COMMUNITY)
Admission: RE | Admit: 2020-12-15 | Discharge: 2020-12-15 | Disposition: A | Discharge status: Home / Self Care | Payer: Medicare Other | Source: Ambulatory Visit | Attending: Internal Medicine | Admitting: Internal Medicine

## 2020-12-15 VITALS — BP 140/82 | HR 78 | Wt 300.0 lb

## 2020-12-15 DIAGNOSIS — Z7901 Long term (current) use of anticoagulants: Secondary | ICD-10-CM | POA: Insufficient documentation

## 2020-12-15 DIAGNOSIS — Z6841 Body Mass Index (BMI) 40.0 and over, adult: Secondary | ICD-10-CM | POA: Insufficient documentation

## 2020-12-15 DIAGNOSIS — E1122 Type 2 diabetes mellitus with diabetic chronic kidney disease: Secondary | ICD-10-CM | POA: Insufficient documentation

## 2020-12-15 DIAGNOSIS — Z8249 Family history of ischemic heart disease and other diseases of the circulatory system: Secondary | ICD-10-CM | POA: Diagnosis not present

## 2020-12-15 DIAGNOSIS — I5032 Chronic diastolic (congestive) heart failure: Secondary | ICD-10-CM

## 2020-12-15 DIAGNOSIS — N1832 Chronic kidney disease, stage 3b: Secondary | ICD-10-CM | POA: Insufficient documentation

## 2020-12-15 DIAGNOSIS — E785 Hyperlipidemia, unspecified: Secondary | ICD-10-CM | POA: Diagnosis not present

## 2020-12-15 DIAGNOSIS — I480 Paroxysmal atrial fibrillation: Secondary | ICD-10-CM | POA: Diagnosis not present

## 2020-12-15 DIAGNOSIS — I10 Essential (primary) hypertension: Secondary | ICD-10-CM | POA: Diagnosis not present

## 2020-12-15 DIAGNOSIS — I2510 Atherosclerotic heart disease of native coronary artery without angina pectoris: Secondary | ICD-10-CM | POA: Diagnosis not present

## 2020-12-15 DIAGNOSIS — I130 Hypertensive heart and chronic kidney disease with heart failure and stage 1 through stage 4 chronic kidney disease, or unspecified chronic kidney disease: Secondary | ICD-10-CM | POA: Insufficient documentation

## 2020-12-15 DIAGNOSIS — Z7984 Long term (current) use of oral hypoglycemic drugs: Secondary | ICD-10-CM | POA: Diagnosis not present

## 2020-12-15 DIAGNOSIS — I252 Old myocardial infarction: Secondary | ICD-10-CM | POA: Insufficient documentation

## 2020-12-15 MED ORDER — ENTRESTO 97-103 MG PO TABS: 1.0000 | ORAL_TABLET | Freq: Two times a day (BID) | ORAL | 3 refills | Status: DC

## 2020-12-15 NOTE — Addendum Note (Signed)
Encounter addended by: Jerl Mina, RN on: 12/15/2020 1:01 PM  Actions taken: Pharmacy for encounter modified, Order list changed

## 2020-12-15 NOTE — Telephone Encounter (Signed)
Patient Advocate Encounter   Received notification from [place] that prior authorization for Kathleen [name] is required.   PA submitted on CoverMyMeds Key [id number] Status is pending   Will continue to follow.

## 2020-12-15 NOTE — Patient Instructions (Signed)
Increase entresto to 97/103.  Your physician recommends that you schedule a follow-up appointment in: 6 months with provider.  If you have any questions or concerns before your next appointment please send [place] a message through [place] or call our office at [phone].    TO LEAVE A MESSAGE FOR THE NURSE SELECT OPTION 2, PLEASE LEAVE A MESSAGE INCLUDING: YOUR NAME DATE OF BIRTH CALL BACK NUMBER REASON FOR CALL**this is important as we prioritize the call backs  YOU WILL RECEIVE A CALL BACK THE SAME DAY AS LONG AS YOU CALL BEFORE 4:00 PM  At the [facility], you and your health needs are our priority. As part of our continuing mission to provide you with exceptional heart care, we have created designated Provider Care Teams. These Care Teams include your primary Cardiologist (physician) and Advanced Practice Providers (APPs- Physician Assistants and Nurse Practitioners) who all work together to provide you with the care you need, when you need it.   You may see any of the following providers on your designated Care Team at your next follow up: Dr Glori Bickers Dr Haynes Kerns, NP Lyda Jester, [place] [facility], [place] Audry Riles, PharmD   Please be sure to bring in all your medications bottles to every appointment.

## 2020-12-15 NOTE — Addendum Note (Signed)
Encounter addended by: Jerl Mina, RN on: 12/15/2020 12:18 PM  Actions taken: Medication long-term status modified, Clinical Note Signed, Pharmacy for encounter modified, Order list changed

## 2020-12-15 NOTE — Progress Notes (Signed)
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 Sleep study 11/2013 was negative. Also has history of A fib.     Had NSTEMI in 2/09 had cath at that time with occlusion of septal perforator. Otherwise normal arteries. Was admitted for CP in 11/13. CE normal. Underwent dobutamine stress echo which was normal.    Admitted 8/16 for Afib RVR. She converted to sinus tach with dilt drip and was transitioned to Toprol-XL 25 mg daily. She was in NSR at the time of discharge. Discharge weight was 325 lb.   Admitted 7/17 with chest pain. CTA negative for PE. Troponins negative.  Had Myoview 08/05/15 with "subtle" anterior wall defect. Felt to  be breast attenuation. Echo stable as below.      Admitted 2/21 for atrial fibrillation w/ RVR in the setting of CAP and also w/ a/c diastolic HF, requiring IV diuretics. She was admitted by [facility] and general cardiology consulted. Echo repeated and EF 60-65%. RV ok. PNA treated w/ abx. AFib w/ Cardizem and metoprolol w/ spontaneous conversion back to NSR. She diuresed well w/ IV Lasix and transitioned back to PO Lasix. Hospital course also notable for AKI. SCr rose to 2.09 during admit but improved down to 1.46 on d/c. D/c wt 330 lb.   Today she returns for HF follow up. Feels good[place] [facility] Doing elliptical for 30 mins and weights. Also joined the [employer] and gets weighed once per week.  Weight down another 5 pounds. Just about to go under 300 pounds. More mobile. Knees  still bothering her. No CP, SOB, orthopnea or PND. Has palpitations about 1-2x per week. Thinks it may AF.   Zio patch 3/22 - 3% AF burden    Past Medical History:  Diagnosis Date   Anemia    Anxiety    Asthma    Atrial fibrillation (HCC)    Bipolar disorder (HCC)    CAD (coronary artery disease)    Cancer (HCC)    basal cell carcinoma on right hand, papilloma left breast   CHF (congestive heart failure) ([place])    pt seen at heart/vascular spec clinic 08/14/2014    Chronic kidney disease    Coarse tremors    hands   Coronary artery disease    Depression    DVT (deep vein thrombosis) in pregnancy    pt. 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[place]) 02/2007   non-q-wave with second septal perforator   Hyperlipidemia    Hypertension    Hypothyroidism    history of took medication, has resolved   Knee pain, left    Lower extremity deep venous thrombosis (HCC)    20 years ago    Measles    hx of in childhood    Morbid obesity  with BMI of 40.0-44.9, adult (HCC)    Pain at surgical incision    Left breast from procedure 09/11/16   Pneumonia    hx of    PONV (postoperative nausea and vomiting)    also slow to wake up   Psychiatric hospitalization 05/2008   Shingles    20 years ago    Shortness of breath dyspnea    exercise    Type 2 diabetes mellitus ([place]) 12/31/2015   Urinary incontinence    UTI (urinary tract infection)    Past Surgical History:  Procedure Laterality Date   BASAL CELL CARCINOMA EXCISION     BREAST EXCISIONAL BIOPSY Left    BREAST LUMPECTOMY WITH RADIOACTIVE SEED LOCALIZATION Left 09/11/2016   Procedure: LEFT BREAST LUMPECTOMY WITH RADIOACTIVE SEED LOCALIZATION;  Surgeon: Excell Seltzer, MD;  Location: [facility];  Service: General;  Laterality: Left;   BREAST MASS EXCISION     age 89, benign tumor   CARDIAC CATHETERIZATION      CARDIAC CATHETERIZATION N/A 12/30/2015   Procedure: Left Heart Cath and Coronary Angiography;  Surgeon: Belva Crome, MD;  Location: [facility];  Service: Cardiovascular;  Laterality: N/A;   COLONOSCOPY  07/15/2009   [facility]   DILATATION & CURETTAGE/HYSTEROSCOPY WITH MYOSURE N/A 09/22/2016   Procedure: DILATATION & CURETTAGE/HYSTEROSCOPY WITH MYOSURE;  Surgeon: Molli Posey, MD;  Location: [facility];  Service: Gynecology;  Laterality: N/A;   DILATION AND CURETTAGE OF UTERUS     KNEE SURGERY     right, removed cartilage   PARATHYROIDECTOMY N/A 08/25/2014   Procedure: PARATHYROIDECTOMY;  Surgeon: Armandina Gemma, MD;  Location: [facility];  Service: General;  Laterality: N/A;   SHOULDER ARTHROSCOPY WITH ROTATOR CUFF REPAIR AND SUBACROMIAL DECOMPRESSION Right 02/27/2017   Procedure: Right shoulder arthroscopic rotator cuff repair with biceps tenodesis and subacromial decompression;  Surgeon: Nicholes Stairs, MD;  Location: [facility];  Service: Orthopedics;  Laterality: Right;  150 mins   TONSILLECTOMY     TUBAL LIGATION       Current Outpatient Medications  Medication Sig Dispense Refill   allopurinol (ZYLOPRIM) 100 MG tablet TAKE TWO TABLETS BY MOUTH DAILY 90 tablet 2   benztropine (COGENTIN) 1 MG tablet Take 1 mg by mouth 2 (two) times daily.  2   Biotin w/ Vitamins C & E (HAIR/SKIN/NAILS PO) Take by mouth.     calcitRIOL (ROCALTROL) 0.25 MCG capsule TAKE ONE CAPSULE BY MOUTH DAILY 65 capsule 2   divalproex (DEPAKOTE ER) 500 MG 24 hr tablet Take 1,500 mg by mouth at bedtime.      furosemide (LASIX) 80 MG tablet TAKE ONE TABLET IN THE MORNING AND TAKE 1/2 TABLET BY MOUTH IN THE AFTERNOON 135 tablet 3   JARDIANCE 10 MG TABS tablet TAKE ONE TABLET BY MOUTH DAILY 90 tablet 3   LATUDA 60 MG TABS Take 60 mg by mouth at bedtime.      levothyroxine (SYNTHROID) 50 MCG tablet Take 1 tablet (50 mcg total) by mouth daily before breakfast. 90 tablet 0   metFORMIN (GLUCOPHAGE) 500 MG tablet TAKE ONE  TABLET BY MOUTH TWICE A DAY WITH A MEAL 160 tablet 3   metoprolol tartrate (LOPRESSOR) 25 MG tablet TAKE ONE TABLET BY MOUTH TWICE A DAY 180 tablet 1   nitroGLYCERIN (NITROSTAT) 0.4 MG SL tablet Place 1 tablet (0.4 mg total) under the tongue every 5 (five) minutes as needed for chest pain. For chest pain 25 tablet 3   OLANZapine (ZYPREXA) 15 MG tablet  Take 15 mg by mouth at bedtime.     rosuvastatin (CRESTOR) 20 MG tablet Take 1 tablet (20 mg total) by mouth daily. 90 tablet 2   sacubitril-valsartan (ENTRESTO) 49-51 MG Take 1 tablet by mouth 2 (two) times daily. 60 tablet 3   UNABLE TO FIND Med Name: One Touch Verio blood glucose meter Lot #[id number] x Exp 01/22/2018     XARELTO 20 MG TABS tablet TAKE ONE TABLET BY MOUTH AT BEDTIME 85 tablet 3   COVID-19 mRNA bivalent vaccine, Pfizer, injection Inject into the muscle. 0.3 mL 0   levalbuterol (XOPENEX HFA) 45 MCG/ACT inhaler Inhale 2 puffs into the lungs every 4 (four) hours as needed for wheezing. 1 Inhaler 1   lisinopril (ZESTRIL) 20 MG tablet TAKE ONE TABLET BY MOUTH DAILY (Patient not taking: Reported on 12/15/2020) 90 tablet 1   No current facility-administered medications for this encounter.    Allergies:   Eggs or egg-derived products, Tetanus toxoids, and Lamictal [lamotrigine]   Social History:  The patient  reports that she has never smoked. She has never used smokeless tobacco. She reports current alcohol use. She reports that she does not use drugs.   Family History:  The patient's family history includes Alcohol abuse in her father, maternal grandfather, maternal grandmother, mother, paternal grandfather, and paternal grandmother; Alzheimer's disease in her father and mother; Breast cancer (age of onset: 69) in her mother; Diabetes in her brother; Drug abuse in her maternal grandfather; Hypertension in her brother; Lung cancer in her mother; Parkinson's disease in her father; Schizophrenia in her cousin.   ROS:  Please see the  history of present illness.   All other systems are personally reviewed and negative.  Vitals:   12/15/20 1110  BP: 140/82  Pulse: 78  SpO2: 97%   Wt Readings from Last 3 Encounters:  12/15/20 136.1 kg (300 lb)  10/13/20 (!) 137.5 kg (303 lb 3.2 oz)  09/29/20 (!) 138.1 kg (304 lb 6.4 oz)   General:  Well appearing. No resp difficulty HEENT: normal Neck: supple. no JVD. Carotids 2+ bilat; no bruits. No lymphadenopathy or thryomegaly appreciated. Cor: PMI nondisplaced. Regular rate & rhythm. No rubs, gallops or murmurs. Lungs: clear Abdomen: obese soft, nontender, nondistended. No hepatosplenomegaly. No bruits or masses. Good bowel sounds. Extremities: no cyanosis, clubbing, rash, tr edema Neuro: alert & orientedx3, cranial nerves grossly intact. moves all 4 extremities w/o difficulty. Affect pleasant  Recent Labs: 03/29/2020: Pro B Natriuretic peptide (BNP) 32.0 09/29/2020: ALT 14; Potassium 4.3; Sodium 141; TSH 3.04 10/11/2020: BUN 28; Creatinine 1.3; Hemoglobin 15.0; Platelets 155  Personally reviewed     ASSESSMENT AND PLAN:  1. Chronic diastolic HF:  -ECHO 06/1441 EF Normal EF 55-60% Grade IDD  -Echo 7/19 EF 55-60%  -Echo 2/21 EF 60-65%, RV ok  - Stable NYHA II. Volume ok - continue Lasix 80 mg qam/40 mg qpm - continue Jardiance 10 mg daily  - continue metoprolol 25 mg bid  - Increase Entresto to 97/103 bid  2. PAF - Regular on exam.  - Zio patch 3/22 - 3% AF burden - Continue metoprolol - CHADSVASC = 5 - Continue Xarelto. No bleeding   3. Morbid obesity: -Body mass index is 41.84 kg/m. - Continues to lose weight. Congrats!   4. CAD:  - No s/s ischemia - no ASA due to Xarelto - continue statin     5. HTN:   - BP mildly elevated. Will increase Entresto    6. Snoring:  - No  OSA on previous sleep study.     7. DM2 - Continue Jardiance 10 mg daily - Continue metformin  - 10/14/19 HgbA1c 6.5  8. CKD 3b - Continue jardiance - Follows with Dr.  Posey Pronto   Signed, Glori Bickers, MD  12/15/2020 11:54 AM  Advanced Heart Failure [place] [address] [phone] (office) [phone] (fax)

## 2020-12-15 NOTE — Telephone Encounter (Signed)
Advanced Heart Failure Patient Advocate Encounter  Spoke to patient regarding re-enrollment of Entresto assistance with [employer]. Patient brought application in to the clinic along with POI. Will fax in once all signatures are received.

## 2020-12-20 ENCOUNTER — Other Ambulatory Visit (HOSPITAL_COMMUNITY): Payer: Self-pay | Admitting: *Deleted

## 2020-12-20 ENCOUNTER — Other Ambulatory Visit (HOSPITAL_COMMUNITY): Payer: Self-pay

## 2020-12-20 DIAGNOSIS — E119 Type 2 diabetes mellitus without complications: Secondary | ICD-10-CM

## 2020-12-20 MED ORDER — ENTRESTO 97-103 MG PO TABS: 1.0000 | ORAL_TABLET | Freq: Two times a day (BID) | ORAL | 3 refills | Status: DC

## 2020-12-20 MED ORDER — EMPAGLIFLOZIN 10 MG PO TABS: 10.0000 mg | ORAL_TABLET | Freq: Every day | ORAL | 3 refills | Status: DC

## 2020-12-20 NOTE — Telephone Encounter (Addendum)
Advanced Heart Failure Patient Advocate Encounter  Prior Authorization for Delene Loll has been approved.    PA# [id number] Effective dates: 12/15/20 through 12/15/2021  Patients co-pay is $47 (30 days). $141 (90 days) with insurance approval and co-pay card, 90 days is now $10. Will not seek assistance renewal at this time. Called and left the patient a message.   Sent 90 day RX request to [facility] to send to [employer], along with the co-pay card.  Charlann Boxer, CPhT

## 2020-12-20 NOTE — Telephone Encounter (Addendum)
Patient called back and we went over change in receiving Entresto. While speaking with the patient, I noticed that she was also taking Jardiance. Was able to obtain $10 co-pay card for Jardiance. Information emailed to the patient, requested a 90 day RX for that as well.   Charlann Boxer, CPhT

## 2020-12-21 ENCOUNTER — Other Ambulatory Visit (HOSPITAL_COMMUNITY): Payer: Self-pay

## 2020-12-21 NOTE — Telephone Encounter (Signed)
Patient called back stating that she accidentally deleted the email with the copay card information.   Resent email with copay card information.  Charlann Boxer, CPhT

## 2020-12-24 ENCOUNTER — Other Ambulatory Visit (HOSPITAL_COMMUNITY): Payer: Self-pay | Admitting: *Deleted

## 2020-12-24 ENCOUNTER — Other Ambulatory Visit (HOSPITAL_COMMUNITY): Payer: Self-pay

## 2020-12-24 NOTE — Telephone Encounter (Signed)
Advanced Heart Failure Patient Advocate Encounter  Patient inquired about a Xarelto co-pay card. I was not able to successfully activate one for her. Called [employer], the representative stated that they could not tell me the billing information for the co-pay card. She could tell me that the copay card was used in June by the [facility] Oppenheim the patient uses.   I called and spoke with a representative at [employer]. She was able to find the co-pay card. They used it in June and did not at the end of September. I requested they make a note in the patient's profile to use that card upon each fill.   Called and left the patient a message.   Charlann Boxer, CPhT

## 2020-12-29 ENCOUNTER — Telehealth: Payer: Self-pay | Admitting: Family Medicine

## 2020-12-29 NOTE — Telephone Encounter (Signed)
FYI: EMS was on the scene per Triage note.

## 2020-12-29 NOTE — Telephone Encounter (Signed)
Pt called and was unsure if she needed an appt with dr.[name] or needed to go to ed. Her symptoms include dizziness, chest pain and numb fingers on her left side. She was informed triage will more than likely advise her to go to ed, and told her there is one downstairs. She was transferred to triage, please advise.

## 2020-12-29 NOTE — Telephone Encounter (Signed)
Nurse Assessment Nurse: Rolin Barry, RN, Levada Dy Date/Time Eilene Ghazi Time): 12/29/2020 1:08:37 PM Confirm and document reason for call. If symptomatic, describe symptoms. ---Caller states she has had chest pain for the last 2 days. She has 2 fingers on her left hand that are numb and she is dizzy. Numbness and dizziness started today. Does the patient have any new or worsening symptoms? ---Yes Will a triage be completed? ---Yes Related visit to physician within the last 2 weeks? ---No Does the PT have any chronic conditions? (i.e. diabetes, asthma, this includes High risk factors for pregnancy, etc.) ---Yes List chronic conditions. ---CHF hx of MI , 12 years ago diabetes kidney disease Is this a behavioral health or substance abuse call? ---No Guidelines Guideline Title Affirmed Question Affirmed Notes Nurse Date/Time (Eastern Time) Chest Pain [1] Chest pain lasts > 5 minutes AND [2] age > 39 Deaton, RN, Levada Dy 12/29/2020 1:09:52 PM Disp. Time Eilene Ghazi Time) Disposition Final User 12/29/2020 1:06:23 PM Send to Urgent Lina Sar 12/29/2020 1:17:25 PM 911 Outcome Documentation Deaton, RN, Levada Dy PLEASE NOTE: All timestamps contained within this report are represented as [place] Standard Time. CONFIDENTIALTY NOTICE: This fax transmission is intended only for the addressee. It contains information that is legally privileged, confidential or otherwise protected from use or disclosure. If you are not the intended recipient, you are strictly prohibited from reviewing, disclosing, copying using or disseminating any of this information or taking any action in reliance on or regarding this information. If you have received this fax in error, please notify us immediately by telephone so that we can arrange for its return to [place]. Phone: [phone], Toll-Free: [phone], Fax: [phone] Page: 2 of 2 Call Id: [id number] [place]. Time Eilene Ghazi Time) Disposition Final User Reason: No answer on  call back. 12/29/2020 1:11:25 PM Call EMS 911 Now Yes Deaton, RN, Cindee Lame Disagree/Comply Comply Caller Understands Yes PreDisposition Did not know what to do Care Advice Given Per Guideline CALL EMS 911 NOW: * Immediate medical attention is needed. You need to hang up and call 911 (or an ambulance). * Triager Discretion: I'll call you back in a few minutes to be sure you were able to reach them. CARE ADVICE given per Chest Pain (Adult) guideline. Comments User: Saverio Danker, RN Date/Time Eilene Ghazi Time): 12/29/2020 1:23:04 PM Spoke with [facility] and EMS is on the scene

## 2020-12-31 ENCOUNTER — Telehealth (HOSPITAL_COMMUNITY): Payer: Self-pay | Admitting: *Deleted

## 2020-12-31 NOTE — Telephone Encounter (Signed)
Pt left vm to inform the office she was taken by EMS to the hospital for chest pain and left sided weakness. Pt said she feels fine not and does not think she needs a call back. I called pt back to get more information she did not answer. I left vm requesting return call.

## 2021-01-03 ENCOUNTER — Other Ambulatory Visit: Payer: Self-pay

## 2021-01-03 ENCOUNTER — Encounter (HOSPITAL_COMMUNITY): Payer: Self-pay

## 2021-01-03 ENCOUNTER — Ambulatory Visit (HOSPITAL_COMMUNITY)
Admission: RE | Admit: 2021-01-03 | Discharge: 2021-01-03 | Disposition: A | Discharge status: Home / Self Care | Payer: Medicare Other | Source: Ambulatory Visit | Attending: Physician Assistant | Admitting: Physician Assistant

## 2021-01-03 VITALS — BP 89/60 | HR 73 | Wt 296.8 lb

## 2021-01-03 DIAGNOSIS — I480 Paroxysmal atrial fibrillation: Secondary | ICD-10-CM | POA: Insufficient documentation

## 2021-01-03 DIAGNOSIS — I252 Old myocardial infarction: Secondary | ICD-10-CM | POA: Diagnosis not present

## 2021-01-03 DIAGNOSIS — R079 Chest pain, unspecified: Secondary | ICD-10-CM

## 2021-01-03 DIAGNOSIS — Z6841 Body Mass Index (BMI) 40.0 and over, adult: Secondary | ICD-10-CM | POA: Diagnosis not present

## 2021-01-03 DIAGNOSIS — E1122 Type 2 diabetes mellitus with diabetic chronic kidney disease: Secondary | ICD-10-CM | POA: Insufficient documentation

## 2021-01-03 DIAGNOSIS — E785 Hyperlipidemia, unspecified: Secondary | ICD-10-CM | POA: Insufficient documentation

## 2021-01-03 DIAGNOSIS — I2510 Atherosclerotic heart disease of native coronary artery without angina pectoris: Secondary | ICD-10-CM | POA: Diagnosis not present

## 2021-01-03 DIAGNOSIS — I5033 Acute on chronic diastolic (congestive) heart failure: Secondary | ICD-10-CM | POA: Insufficient documentation

## 2021-01-03 DIAGNOSIS — I130 Hypertensive heart and chronic kidney disease with heart failure and stage 1 through stage 4 chronic kidney disease, or unspecified chronic kidney disease: Secondary | ICD-10-CM | POA: Insufficient documentation

## 2021-01-03 DIAGNOSIS — I5032 Chronic diastolic (congestive) heart failure: Secondary | ICD-10-CM | POA: Diagnosis not present

## 2021-01-03 DIAGNOSIS — Z7984 Long term (current) use of oral hypoglycemic drugs: Secondary | ICD-10-CM | POA: Diagnosis not present

## 2021-01-03 DIAGNOSIS — Z79899 Other long term (current) drug therapy: Secondary | ICD-10-CM | POA: Diagnosis not present

## 2021-01-03 DIAGNOSIS — N1832 Chronic kidney disease, stage 3b: Secondary | ICD-10-CM | POA: Diagnosis not present

## 2021-01-03 DIAGNOSIS — I10 Essential (primary) hypertension: Secondary | ICD-10-CM | POA: Diagnosis not present

## 2021-01-03 DIAGNOSIS — Z7901 Long term (current) use of anticoagulants: Secondary | ICD-10-CM | POA: Diagnosis not present

## 2021-01-03 LAB — BASIC METABOLIC PANEL
Anion gap: 9 (ref 5–15)
BUN: 25 mg/dL — ABNORMAL HIGH (ref 8–23)
CO2: 27 mmol/L (ref 22–32)
Calcium: 9.6 mg/dL (ref 8.9–10.3)
Chloride: 103 mmol/L (ref 98–111)
Creatinine, Ser: 1.44 mg/dL — ABNORMAL HIGH (ref 0.44–1.00)
GFR, Estimated: 40 mL/min — ABNORMAL LOW (ref >60)
Glucose, Bld: 95 mg/dL (ref 70–99)
Potassium: 4.4 mmol/L (ref 3.5–5.1)
Sodium: 139 mmol/L (ref 135–145)

## 2021-01-03 LAB — BRAIN NATRIURETIC PEPTIDE: B Natriuretic Peptide: 55.5 pg/mL (ref 0.0–100.0)

## 2021-01-03 MED ORDER — ENTRESTO 49-51 MG PO TABS: 1.0000 | ORAL_TABLET | Freq: Two times a day (BID) | ORAL | 4 refills | Status: DC

## 2021-01-03 NOTE — Patient Instructions (Addendum)
Thank You for coming in today  EKG was done today  Labs were done today, if any labs are abnormal the clinic will call you  Your physician recommends that you schedule a follow-up appointment in: 4-6 weeks  DECREASE Entresto to 49/51 mg 1 tablet.Marland Kitchentwice daily  At the [facility], you and your health needs are our priority. As part of our continuing mission to provide you with exceptional heart care, we have created designated Provider Care Teams. These Care Teams include your primary Cardiologist (physician) and Advanced Practice Providers (APPs- Physician Assistants and Nurse Practitioners) who all work together to provide you with the care you need, when you need it.   You may see any of the following providers on your designated Care Team at your next follow up: Dr Glori Bickers Dr Haynes Kerns, NP Lyda Jester, [place] [facility] [employer], [place] Audry Riles, PharmD   Please be sure to bring in all your medications bottles to every appointment.   If you have any questions or concerns before your next appointment please send [place] a message through [place] or call our office at [phone].    TO LEAVE A MESSAGE FOR THE NURSE SELECT OPTION 2, PLEASE LEAVE A MESSAGE INCLUDING: YOUR NAME DATE OF BIRTH CALL BACK NUMBER REASON FOR CALL**this is important as we prioritize the call backs  YOU WILL RECEIVE A CALL BACK THE SAME DAY AS LONG AS YOU CALL BEFORE 4:00 PM

## 2021-01-03 NOTE — Progress Notes (Signed)
ReDS Vest / Clip - 01/03/21 1500       ReDS Vest / Clip   Station Marker D    Ruler Value 34.5    ReDS Value Range Low volume    ReDS Actual Value 24

## 2021-01-03 NOTE — Progress Notes (Addendum)
ADVANCED HF CLINIC NOTE  Date:  01/03/2021   ID:  Kathleen [name], DOB 06-03-1955, MRN [id number]  Location: Home  Provider location: [facility] Type of Visit: Established patient  PCP:  [name], [name], MD  Cardiologist:  Glori Bickers, MD Primary HF: Bensimhon  Chief Complaint: Heart Failure    History of Present Illness: Kathleen [name] is a 65 y/o woman (mother of [name] - ICU nurse) with a h/o morbid obesity, chronic diastolic HF, DM2, HTN, HL and CAD. Sleep study 11/2013 was negative. Also has history of A fib.     Had NSTEMI in 2/09 had cath at that time with occlusion of septal perforator. Otherwise normal arteries. Was admitted for CP in 11/13. CE normal. Underwent dobutamine stress echo which was normal.    Admitted 8/16 for Afib RVR. She converted to sinus tach with dilt drip and was transitioned to Toprol-XL 25 mg daily. She was in NSR at the time of discharge. Discharge weight was 325 lb.   Admitted 7/17 with chest pain. CTA negative for PE. Troponins negative.  Had Myoview 08/05/15 with "subtle" anterior wall defect. Felt to be breast attenuation. Echo stable as below.   LHC 12/17 with widely patent coronary arteries. LVEF 55%. Mildly elevated LVEDP.   Admitted 2/21 for atrial fibrillation w/ RVR in the setting of CAP and also w/ a/c diastolic HF, requiring IV diuretics. She was admitted by [facility] and general cardiology consulted. Echo repeated and EF 60-65%. RV ok. PNA treated w/ abx. AFib w/ Cardizem and metoprolol w/ spontaneous conversion back to NSR. She diuresed well w/ IV Lasix and transitioned back to PO Lasix. Hospital course also notable for AKI. SCr rose to 2.09 during admit but improved down to 1.46 on d/c. D/c wt 330 lb.  Zio patch 3/22 - 3% AF burden  Seen in ED at [facility] 12/07 for CP X 2 days. Reports blood pressure 87/60 en route to hospital.  Also noted cough. Chest x-ray with no acute process. ECG report indicates NSR  72 bpm (tracing not available). Negative for COVID-19 and flu. Scr 1.86, BNP 179, troponin I 3 > 3. Declined admission for stress test. She was felt to be stable for discharge home and outpatient f/u with cardiology.   She is here today for HF f/u. Denies any recurrent chest pain/pressure since ED. Continues to have some dyspnea with exertion and LE edema. Abdomen also feels distended. [facility] is [facility] in "[place]" program. Evelina Bucy an extra 80 mg furosemide yesterday and weight down 2 lb overnight on her home scale. Notes some improvement in dyspnea with additional diuretic. Weight now below 300 lb with exercise and watching her diet.     Past Medical History:  Diagnosis Date   Anemia    Anxiety    Asthma    Atrial fibrillation ([place])    Bipolar disorder ([place])    CAD (coronary artery disease)    Cancer (HCC)    basal cell carcinoma on right hand, papilloma left breast   CHF (congestive heart failure) ([place])    pt seen at heart/vascular spec clinic 08/14/2014    Chronic kidney disease    Coarse tremors    hands   Coronary artery disease    Depression    DVT (deep vein thrombosis) in pregnancy    pt. reports that it was post knee surgery, not during pregnancy  Dyslipidemia    Fall    Fatty liver    Headache    migraines - stopped after menopause   Heart attack ([place]) 02/2007   non-q-wave with second septal perforator   Hyperlipidemia    Hypertension    Hypothyroidism    history of took medication, has resolved   Knee pain, left    Lower extremity deep venous thrombosis (HCC)    20 years ago    Measles    hx of in childhood    Morbid obesity with BMI of 40.0-44.9, adult ([place])    Pain at surgical incision    Left breast from procedure 09/11/16   Pneumonia    hx of    PONV (postoperative nausea and vomiting)    also slow to wake up   Psychiatric hospitalization 05/2008   Shingles    20 years ago     Shortness of breath dyspnea    exercise    Type 2 diabetes mellitus ([place]) 12/31/2015   Urinary incontinence    UTI (urinary tract infection)    Past Surgical History:  Procedure Laterality Date   BASAL CELL CARCINOMA EXCISION     BREAST EXCISIONAL BIOPSY Left    BREAST LUMPECTOMY WITH RADIOACTIVE SEED LOCALIZATION Left 09/11/2016   Procedure: LEFT BREAST LUMPECTOMY WITH RADIOACTIVE SEED LOCALIZATION;  Surgeon: Excell Seltzer, MD;  Location: [place];  Service: General;  Laterality: Left;   BREAST MASS EXCISION     age 63, benign tumor   CARDIAC CATHETERIZATION     CARDIAC CATHETERIZATION N/A 12/30/2015   Procedure: Left Heart Cath and Coronary Angiography;  Surgeon: Belva Crome, MD;  Location: [place] [facility];  Service: Cardiovascular;  Laterality: N/A;   COLONOSCOPY  07/15/2009   [facility]   DILATATION & CURETTAGE/HYSTEROSCOPY WITH MYOSURE N/A 09/22/2016   Procedure: DILATATION & CURETTAGE/HYSTEROSCOPY WITH MYOSURE;  Surgeon: Molli Posey, MD;  Location: [facility];  Service: Gynecology;  Laterality: N/A;   DILATION AND CURETTAGE OF UTERUS     KNEE SURGERY     right, removed cartilage   PARATHYROIDECTOMY N/A 08/25/2014   Procedure: PARATHYROIDECTOMY;  Surgeon: Armandina Gemma, MD;  Location: [facility];  Service: General;  Laterality: N/A;   SHOULDER ARTHROSCOPY WITH ROTATOR CUFF REPAIR AND SUBACROMIAL DECOMPRESSION Right 02/27/2017   Procedure: Right shoulder arthroscopic rotator cuff repair with biceps tenodesis and subacromial decompression;  Surgeon: Nicholes Stairs, MD;  Location: [place];  Service: Orthopedics;  Laterality: Right;  150 mins   TONSILLECTOMY     TUBAL LIGATION       Current Outpatient Medications  Medication Sig Dispense Refill   allopurinol (ZYLOPRIM) 100 MG tablet TAKE TWO TABLETS BY MOUTH DAILY 90 tablet 2   benztropine (COGENTIN) 1 MG tablet Take 1 mg by mouth 2 (two) times daily.  2   Biotin w/ Vitamins C & E (HAIR/SKIN/NAILS PO) Take by mouth.     calcitRIOL  (ROCALTROL) 0.25 MCG capsule TAKE ONE CAPSULE BY MOUTH DAILY 65 capsule 2   COVID-19 mRNA bivalent vaccine, Pfizer, injection Inject into the muscle. 0.3 mL 0   divalproex (DEPAKOTE ER) 500 MG 24 hr tablet Take 1,000 mg by mouth at bedtime.     empagliflozin (JARDIANCE) 10 MG TABS tablet Take 1 tablet (10 mg total) by mouth daily. 90 tablet 3   furosemide (LASIX) 80 MG tablet TAKE ONE TABLET IN THE MORNING AND TAKE 1/2 TABLET BY MOUTH IN THE AFTERNOON 135 tablet 3   LATUDA 60 MG TABS Take  60 mg by mouth at bedtime.      levalbuterol (XOPENEX HFA) 45 MCG/ACT inhaler Inhale 2 puffs into the lungs every 4 (four) hours as needed for wheezing. 1 Inhaler 1   levothyroxine (SYNTHROID) 50 MCG tablet Take 1 tablet (50 mcg total) by mouth daily before breakfast. 90 tablet 0   metFORMIN (GLUCOPHAGE) 500 MG tablet TAKE ONE TABLET BY MOUTH TWICE A DAY WITH A MEAL 160 tablet 3   metoprolol tartrate (LOPRESSOR) 25 MG tablet TAKE ONE TABLET BY MOUTH TWICE A DAY 180 tablet 1   nitroGLYCERIN (NITROSTAT) 0.4 MG SL tablet Place 1 tablet (0.4 mg total) under the tongue every 5 (five) minutes as needed for chest pain. For chest pain 25 tablet 3   OLANZapine (ZYPREXA) 15 MG tablet Take 15 mg by mouth at bedtime.     rosuvastatin (CRESTOR) 20 MG tablet Take 1 tablet (20 mg total) by mouth daily. 90 tablet 2   sacubitril-valsartan (ENTRESTO) 97-103 MG Take 1 tablet by mouth 2 (two) times daily. 180 tablet 3   UNABLE TO FIND Med Name: One Touch Verio blood glucose meter Lot #[id number] x Exp 01/22/2018     XARELTO 20 MG TABS tablet TAKE ONE TABLET BY MOUTH AT BEDTIME 85 tablet 3   No current facility-administered medications for this encounter.    Allergies:   Eggs or egg-derived products, Tetanus toxoids, and Lamictal [lamotrigine]   Social History:  The patient  reports that she has never smoked. She has never used smokeless tobacco. She reports current alcohol use. She reports that she does not use drugs.   Family  History:  The patient's family history includes Alcohol abuse in her father, maternal grandfather, maternal grandmother, mother, paternal grandfather, and paternal grandmother; Alzheimer's disease in her father and mother; Breast cancer (age of onset: 79) in her mother; Diabetes in her brother; Drug abuse in her maternal grandfather; Hypertension in her brother; Lung cancer in her mother; Parkinson's disease in her father; Schizophrenia in her cousin.   ROS:  Please see the history of present illness.   All other systems are personally reviewed and negative.  Vitals:   01/03/21 1423  BP: (!) 89/60  Pulse: 73  SpO2: 95%   Wt Readings from Last 3 Encounters:  01/03/21 134.6 kg (296 lb 12.8 oz)  12/15/20 136.1 kg (300 lb)  10/13/20 (!) 137.5 kg (303 lb 3.2 oz)   General:  No distress. Ambulated into clinic. HEENT: normal Neck: supple. JVP difficult to assess. Carotids 2+ bilat; no bruits.  Cor: PMI nondisplaced. Regular rate & rhythm. No rubs, gallops or murmurs. Lungs: clear Abdomen: soft, nontender, + distended. No hepatosplenomegaly.  Extremities: no cyanosis, clubbing, rash, 1 + edema Neuro: alert & orientedx3, cranial nerves grossly intact. moves all 4 extremities w/o difficulty. Affect pleasant   Recent Labs: 03/29/2020: Pro B Natriuretic peptide (BNP) 32.0 09/29/2020: ALT 14; Potassium 4.3; Sodium 141; TSH 3.04 10/11/2020: BUN 28; Creatinine 1.3; Hemoglobin 15.0; Platelets 155  Personally reviewed   ECG: NSR 71 bpm  ReDS: 24%  ASSESSMENT AND PLAN:  1. Chronic diastolic HF:  -ECHO 04/3152 EF Normal EF 55-60% Grade IDD  -Echo 7/19 EF 55-60%  -Echo 2/21 EF 60-65%, RV ok  - NYHA II-III. Volume okay, 24% by ReDS clip. Took extra 80 mg furosemide yesterday with 2-3 lb weight loss. - continue Lasix 80 mg qam/40 mg qpm. Can take an extra 40 mg furosemide as needed going forward. - continue Jardiance 10 mg daily  - continue  metoprolol 25 mg bid  - Decrease entresto to 49/51 mg BID  with low blood pressure/dizziness - BMET/BNP today - Get updated echo d/t increased dyspnea  2. CP: - Notes sharp, continuous chest pain for 2 days prior to ED visit 12/07.  Ruled out for MI - No recurrent CP since discharge, ? If may have been d/t mild volume overload. Has continued exercise at [facility]. No exertional chest discomfort reported. - Obtaining updated echo as above - Recommended she call with any recurrent symptoms. Would then need to consider further ischemic evaluation.  3. PAF - SR on ECG today - Zio patch 3/22 - 3% AF burden - Continue metoprolol - CHADSVASC = 5 - Continue Xarelto. No bleeding   4. Morbid obesity: -Body mass index is 41.4 kg/m. - Continues to lose weight. She was congratulated on this.   5. CAD:  - See above - no ASA due to Xarelto - continue statin     6. HTN:   - BP low. Med changes as above.   7. Snoring:  - No OSA on previous sleep study.     8. DM2 - Continue Jardiance 10 mg daily - Continue metformin  - 9/22 HgbA1c 7.0  9. CKD 3b - Continue jardiance - Follows with Dr. Posey Pronto - BMET today  F/u: 6 weeks with echo, call sooner with any recurrence of chest pain  Signed, Leata Mouse, PA-C  01/03/2021 3:01 PM  Advanced [facility] Failure [place] [address] [phone] (office) [phone] (fax)

## 2021-01-05 ENCOUNTER — Telehealth (HOSPITAL_COMMUNITY): Payer: Self-pay

## 2021-01-05 ENCOUNTER — Encounter (HOSPITAL_COMMUNITY): Payer: Self-pay

## 2021-01-05 MED ORDER — FUROSEMIDE 80 MG PO TABS: 40.0000 mg | ORAL_TABLET | Freq: Two times a day (BID) | ORAL | 3 refills | Status: DC

## 2021-01-05 NOTE — Telephone Encounter (Signed)
-----   Message from Joette Catching, [place] sent at 01/04/2021  7:02 AM EST ----- Labs stable. BNP actually down. Suggest decreasing lasix to 40 mg BID. Can take extra 40 mg lasix as needed for dyspnea or weight gain. Monitor BP at home. If consistently above 093 systolic after a few days try to increase enstresto back to 97/103 mg BID

## 2021-01-05 NOTE — Telephone Encounter (Signed)
Patient advised and verbalized understanding. Med list updated to reflect changes, results were also sent via mychart because patient could not find a pen to write down medication changes.   Meds ordered this encounter  Medications   furosemide (LASIX) 80 MG tablet    Sig: Take 0.5 tablets (40 mg total) by mouth 2 (two) times daily. Can take an additional 40mg  as needed for weight gain of 3 pounds for more in 24 hours    Dispense:  135 tablet    Refill:  3

## 2021-01-10 ENCOUNTER — Ambulatory Visit
Admission: RE | Admit: 2021-01-10 | Discharge: 2021-01-10 | Disposition: A | Discharge status: Home / Self Care | Payer: Medicare Other | Source: Ambulatory Visit | Attending: Family Medicine | Admitting: Family Medicine

## 2021-01-10 DIAGNOSIS — Z1231 Encounter for screening mammogram for malignant neoplasm of breast: Secondary | ICD-10-CM

## 2021-01-12 ENCOUNTER — Other Ambulatory Visit: Payer: Self-pay | Admitting: Family Medicine

## 2021-01-12 DIAGNOSIS — R928 Other abnormal and inconclusive findings on diagnostic imaging of breast: Secondary | ICD-10-CM

## 2021-01-13 ENCOUNTER — Other Ambulatory Visit: Payer: Self-pay | Admitting: Family Medicine

## 2021-01-13 DIAGNOSIS — I10 Essential (primary) hypertension: Secondary | ICD-10-CM

## 2021-01-13 DIAGNOSIS — E7800 Pure hypercholesterolemia, unspecified: Secondary | ICD-10-CM

## 2021-02-08 ENCOUNTER — Ambulatory Visit
Admission: RE | Admit: 2021-02-08 | Discharge: 2021-02-08 | Disposition: A | Discharge status: Home / Self Care | Payer: Medicare PPO | Source: Ambulatory Visit | Attending: Family Medicine | Admitting: Family Medicine

## 2021-02-08 ENCOUNTER — Encounter: Payer: Self-pay | Admitting: Family Medicine

## 2021-02-08 ENCOUNTER — Ambulatory Visit
Admission: RE | Admit: 2021-02-08 | Discharge: 2021-02-08 | Disposition: A | Discharge status: Home / Self Care | Payer: Medicare Other | Source: Ambulatory Visit | Attending: Family Medicine | Admitting: Family Medicine

## 2021-02-08 ENCOUNTER — Other Ambulatory Visit: Payer: Self-pay

## 2021-02-08 DIAGNOSIS — R928 Other abnormal and inconclusive findings on diagnostic imaging of breast: Secondary | ICD-10-CM

## 2021-02-08 DIAGNOSIS — Z9189 Other specified personal risk factors, not elsewhere classified: Secondary | ICD-10-CM

## 2021-02-09 ENCOUNTER — Telehealth: Payer: Self-pay | Admitting: Hematology and Oncology

## 2021-02-09 NOTE — Telephone Encounter (Signed)
Scheduled appt per 1/17 referral. Pt is aware of appt date and time. Pt is aware to arrive 15 mins prior to appt time.

## 2021-02-14 NOTE — Progress Notes (Signed)
ADVANCED HF CLINIC NOTE  Date:  02/15/2021   ID:  Kathleen [name], DOB 1955/07/13, MRN [id number]  Location: Home  Provider location: [facility] Type of Visit: Established patient  PCP:  [name], [name], MD  Cardiologist:  Glori Bickers, MD Primary HF: [place]  Chief Complaint: Heart Failure    History of Present Illness: Kathleen [name] is a 66 y.o.woman (mother of [name] - ICU nurse) with a h/o morbid obesity, chronic diastolic HF, DM2, HTN, HL and CAD. Sleep study 11/2013 was negative. Also has history of A fib.     Had NSTEMI in 2/09 had cath at that time with occlusion of septal perforator. Otherwise normal arteries. Was admitted for CP in 11/13. CE normal. Underwent dobutamine stress echo which was normal.    Admitted 8/16 for Afib RVR. She converted to sinus tach with dilt drip and was transitioned to Toprol-XL 25 mg daily. She was in NSR at the time of discharge. Discharge weight was 325 lb.   Admitted 7/17 with chest pain. CTA negative for PE. Troponins negative.  Had Myoview 08/05/15 with "subtle" anterior wall defect. Felt to be breast attenuation. Echo stable as below.   LHC 12/17 with widely patent coronary arteries. LVEF 55%. Mildly elevated LVEDP.   Admitted 2/21 for atrial fibrillation w/ RVR in the setting of CAP and also w/ a/c diastolic HF, requiring IV diuretics. She was admitted by [facility] and general cardiology consulted. Echo repeated and EF 60-65%. RV ok. PNA treated w/ abx. AFib w/ Cardizem and metoprolol w/ spontaneous conversion back to NSR. She diuresed well w/ IV Lasix and transitioned back to PO Lasix. Hospital course also notable for AKI. SCr rose to 2.09 during admit but improved down to 1.46 on d/c. D/c wt 330 lb.  Zio patch 3/22 - 3% AF burden  Seen in ED at [facility] 12/29/20 for CP X 2 days. Reports blood pressure 87/60 en route to hospital.  Also noted cough. CXR ok. ECG report indicates NSR 72 bpm (tracing not  available). Negative for COVID-19 and flu. Scr 1.86, BNP 179, troponin I 3 > 3. Declined admission for stress test. She was felt to be stable for discharge home and outpatient f/u with cardiology.   Follow up 12/22, had taken extra Lasix for SOB, ReDs 24%. BP low and Entresto decreased to 49/51. Had atypical CP. Echo arranged.  Echo today, results pending.  Today she returns for HF follow up with her friend. She continues her exercises classes 6 days a week (elliptical, stretching and weights). She does not have SOB with this. Occasional atypical chest pain, occurs randomly, and is sharp in nature. Overall feeling fine. She has been on goal dose of Entresto x 2 weeks but feels "off". Denies palpitations, CP, dizziness, edema, or PND/Orthopnea. Appetite ok. No fever or chills. Weight slowly trending down, now below 300 lbs. Taking all medications.    Past Medical History:  Diagnosis Date   Anemia    Anxiety    Asthma    Atrial fibrillation ([place])    Bipolar disorder ([place])    CAD (coronary artery disease)    Cancer (HCC)    basal cell carcinoma on right hand, papilloma left breast   CHF (congestive heart failure) ([place])    pt seen at heart/vascular spec clinic 08/14/2014    Chronic kidney disease    Coarse tremors    hands   Coronary artery disease    Depression  DVT (deep vein thrombosis) in pregnancy    pt. 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[place]) 02/2007   non-q-wave with second septal perforator   Hyperlipidemia    Hypertension    Hypothyroidism    history of took medication, has resolved   Knee pain, left    Lower extremity deep venous thrombosis (HCC)    20 years ago    Measles    hx of in childhood    Morbid obesity with BMI of 40.0-44.9, adult ([place])    Pain at surgical incision    Left breast from procedure 09/11/16   Pneumonia    hx of     PONV (postoperative nausea and vomiting)    also slow to wake up   Psychiatric hospitalization 05/2008   Shingles    20 years ago    Shortness of breath dyspnea    exercise    Type 2 diabetes mellitus ([name]) 12/31/2015   Urinary incontinence    UTI (urinary tract infection)    Past Surgical History:  Procedure Laterality Date   BASAL CELL CARCINOMA EXCISION     BREAST EXCISIONAL BIOPSY Left 2018   BREAST LUMPECTOMY WITH RADIOACTIVE SEED LOCALIZATION Left 09/11/2016   Procedure: LEFT BREAST LUMPECTOMY WITH RADIOACTIVE SEED LOCALIZATION;  Surgeon: Excell Seltzer, MD;  Location: [place];  Service: General;  Laterality: Left;   BREAST MASS EXCISION     age 16, benign tumor   CARDIAC CATHETERIZATION     CARDIAC CATHETERIZATION N/A 12/30/2015   Procedure: Left Heart Cath and Coronary Angiography;  Surgeon: Belva Crome, MD;  Location: [place] [facility];  Service: Cardiovascular;  Laterality: N/A;   COLONOSCOPY  07/15/2009   [facility]   DILATATION & CURETTAGE/HYSTEROSCOPY WITH MYOSURE N/A 09/22/2016   Procedure: DILATATION & CURETTAGE/HYSTEROSCOPY WITH MYOSURE;  Surgeon: Molli Posey, MD;  Location: [facility];  Service: Gynecology;  Laterality: N/A;   DILATION AND CURETTAGE OF UTERUS     KNEE SURGERY     right, removed cartilage   PARATHYROIDECTOMY N/A 08/25/2014   Procedure: PARATHYROIDECTOMY;  Surgeon: Armandina Gemma, MD;  [facility];  Service: General;  Laterality: N/A;   SHOULDER ARTHROSCOPY WITH ROTATOR CUFF REPAIR AND SUBACROMIAL DECOMPRESSION Right 02/27/2017   Procedure: Right shoulder arthroscopic rotator cuff repair with biceps tenodesis and subacromial decompression;  Surgeon: Nicholes Stairs, MD;  Location: [place];  Service: Orthopedics;  Laterality: Right;  150 mins   TONSILLECTOMY     TUBAL LIGATION       Current Outpatient Medications  Medication Sig Dispense Refill   allopurinol (ZYLOPRIM) 100 MG tablet TAKE TWO TABLETS BY MOUTH DAILY 90 tablet 2    benztropine (COGENTIN) 1 MG tablet Take 1 mg by mouth 2 (two) times daily.  2   Biotin w/ Vitamins C & E (HAIR/SKIN/NAILS PO) Take by mouth.     calcitRIOL (ROCALTROL) 0.25 MCG capsule TAKE ONE CAPSULE BY MOUTH DAILY 65 capsule 2   COVID-19 mRNA bivalent vaccine, Pfizer, injection Inject into the muscle. 0.3 mL 0   divalproex (DEPAKOTE ER) 500 MG 24 hr tablet Take 1,000 mg by mouth at bedtime.     empagliflozin (JARDIANCE) 10 MG TABS tablet Take 1 tablet (10 mg total) by mouth daily. 90 tablet 3   furosemide (LASIX) 80 MG tablet Take 0.5 tablets (  40 mg total) by mouth 2 (two) times daily. Can take an additional 40mg  as needed for weight gain of 3 pounds for more in 24 hours 135 tablet 3   LATUDA 60 MG TABS Take 60 mg by mouth at bedtime.      levalbuterol (XOPENEX HFA) 45 MCG/ACT inhaler Inhale 2 puffs into the lungs every 4 (four) hours as needed for wheezing. 1 Inhaler 1   levothyroxine (SYNTHROID) 50 MCG tablet Take 1 tablet (50 mcg total) by mouth daily before breakfast. 90 tablet 0   metFORMIN (GLUCOPHAGE) 500 MG tablet TAKE ONE TABLET BY MOUTH TWICE A DAY WITH MEALS 180 tablet 1   metoprolol tartrate (LOPRESSOR) 25 MG tablet TAKE ONE TABLET BY MOUTH TWICE A DAY 180 tablet 1   nitroGLYCERIN (NITROSTAT) 0.4 MG SL tablet Place 1 tablet (0.4 mg total) under the tongue every 5 (five) minutes as needed for chest pain. For chest pain 25 tablet 3   OLANZapine (ZYPREXA) 15 MG tablet Take 15 mg by mouth at bedtime.     rosuvastatin (CRESTOR) 20 MG tablet TAKE ONE TABLET BY MOUTH DAILY 90 tablet 2   sacubitril-valsartan (ENTRESTO) 49-51 MG Patient takes 2 tablets in the morning and 2 tablets in the evening.     UNABLE TO FIND Med Name: One Touch Verio blood glucose meter Lot #[id number] x Exp 01/22/2018     XARELTO 20 MG TABS tablet TAKE ONE TABLET BY MOUTH AT BEDTIME 85 tablet 3   No current facility-administered medications for this encounter.    Allergies:   Eggs or egg-derived products, Tetanus  toxoids, and Lamictal [lamotrigine]   Social History:  The patient  reports that she has never smoked. She has never used smokeless tobacco. She reports current alcohol use. She reports that she does not use drugs.   Family History:  The patient's family history includes Alcohol abuse in her father, maternal grandfather, maternal grandmother, mother, paternal grandfather, and paternal grandmother; Alzheimer's disease in her father and mother; Breast cancer (age of onset: 38) in her mother; Diabetes in her brother; Drug abuse in her maternal grandfather; Hypertension in her brother; Lung cancer in her mother; Parkinson's disease in her father; Schizophrenia in her cousin.   ROS:  Please see the history of present illness.   All other systems are personally reviewed and negative.  BP (!) 94/58    Pulse 69    Wt 134.2 kg (295 lb 12.8 oz)    SpO2 99%    BMI 41.26 kg/m   Wt Readings from Last 3 Encounters:  02/15/21 134.2 kg (295 lb 12.8 oz)  01/03/21 134.6 kg (296 lb 12.8 oz)  12/15/20 136.1 kg (300 lb)   Physical Exam General:  NAD. No resp difficulty, walked into clinic with cane HEENT: Normal Neck: Supple. No JVD. Carotids 2+ bilat; no bruits. No lymphadenopathy or thryomegaly appreciated. Cor: PMI nondisplaced. Regular rate & rhythm. No rubs, gallops or murmurs. Lungs: Clear Abdomen: Obese, nontender, nondistended. No hepatosplenomegaly. No bruits or masses. Good bowel sounds. Extremities: No cyanosis, clubbing, rash, edema Neuro: Alert & oriented x 3, cranial nerves grossly intact. Moves all 4 extremities w/o difficulty. Affect pleasant.  Recent Labs: 03/29/2020: Pro B Natriuretic peptide (BNP) 32.0 09/29/2020: ALT 14; TSH 3.04 10/11/2020: Hemoglobin 15.0; Platelets 155 01/03/2021: B Natriuretic Peptide 55.5; BUN 25; Creatinine, Ser 1.44; Potassium 4.4; Sodium 139  Personally reviewed   ASSESSMENT AND PLAN:  1. Chronic diastolic HF:  - Echo 01/7614 EF Normal EF 55-60% Grade IDD  -  Echo  7/19 EF 55-60%  - Echo 2/21 EF 60-65%, RV ok  - Echo today 02/15/21, results pending. - Stable NYHA II. Volume looks good today. She looks good today! - BP low, decrease Entresto back to 49/51 mg bid. - Continue Lasix 40 mg bid.  - Continue Jardiance 10 mg daily.  - Continue metoprolol 25 mg bid . - Check BP tonight, if sBP <100, hold evening dose of Entresto. - BMET on 12/22 reviewed and ok, SCr1.44, K 4.4 - BNP today  2. CP: - ED visit 12/29/20 for CP.  Ruled out for MI. - Occasional  precordial chest pain, not bothersome and does not affect her exercises. - Await echo results, if EF down, consider ischemic work up.  3. PAF - Regular on exam today. - Zio patch 3/22 - 3% AF burden - Continue metoprolol - CHADSVASC = 5 - Continue Xarelto. No bleeding.   4. Morbid obesity: -Body mass index is 41.26 kg/m. - Continues to lose weight. Now down below 300 lbs!   5. CAD:  - See above - no ASA due to Xarelto. - Continue statin.     6. HTN:   - BP low. Med changes as above. - Check BP at home and log. - Hold tonight's dose of Entresto if sBP <100.  7. DM2 - Continue Jardiance 10 mg daily. - Continue metformin  - 9/22 HgbA1c 7.0  8. CKD 3b - Follows with Dr. Posey Pronto - Continue SGLT2i. - BMET today  9. Snoring:  - No OSA on previous sleep study.   Follow up in 5 months with Dr. [name].  Frankey Poot, FNP  02/15/2021 11:50 AM  Advanced Heart Failure [place] [address] [facility] and [place] [address] [phone] (office) [phone] (fax)

## 2021-02-15 ENCOUNTER — Other Ambulatory Visit: Payer: Self-pay

## 2021-02-15 ENCOUNTER — Encounter (HOSPITAL_COMMUNITY): Payer: Self-pay

## 2021-02-15 ENCOUNTER — Ambulatory Visit (HOSPITAL_BASED_OUTPATIENT_CLINIC_OR_DEPARTMENT_OTHER)
Admission: RE | Admit: 2021-02-15 | Discharge: 2021-02-15 | Disposition: A | Discharge status: Home / Self Care | Payer: Medicare PPO | Source: Ambulatory Visit | Attending: Family Medicine | Admitting: Family Medicine

## 2021-02-15 ENCOUNTER — Ambulatory Visit (HOSPITAL_COMMUNITY)
Admission: RE | Admit: 2021-02-15 | Discharge: 2021-02-15 | Disposition: A | Discharge status: Home / Self Care | Payer: Medicare PPO | Source: Ambulatory Visit | Attending: Cardiology | Admitting: Cardiology

## 2021-02-15 VITALS — BP 94/58 | HR 69 | Wt 295.8 lb

## 2021-02-15 DIAGNOSIS — Z7984 Long term (current) use of oral hypoglycemic drugs: Secondary | ICD-10-CM | POA: Insufficient documentation

## 2021-02-15 DIAGNOSIS — E785 Hyperlipidemia, unspecified: Secondary | ICD-10-CM | POA: Diagnosis not present

## 2021-02-15 DIAGNOSIS — R079 Chest pain, unspecified: Secondary | ICD-10-CM | POA: Diagnosis not present

## 2021-02-15 DIAGNOSIS — Z6841 Body Mass Index (BMI) 40.0 and over, adult: Secondary | ICD-10-CM | POA: Insufficient documentation

## 2021-02-15 DIAGNOSIS — R0683 Snoring: Secondary | ICD-10-CM | POA: Insufficient documentation

## 2021-02-15 DIAGNOSIS — E118 Type 2 diabetes mellitus with unspecified complications: Secondary | ICD-10-CM

## 2021-02-15 DIAGNOSIS — N183 Chronic kidney disease, stage 3 unspecified: Secondary | ICD-10-CM

## 2021-02-15 DIAGNOSIS — E1122 Type 2 diabetes mellitus with diabetic chronic kidney disease: Secondary | ICD-10-CM | POA: Diagnosis not present

## 2021-02-15 DIAGNOSIS — I082 Rheumatic disorders of both aortic and tricuspid valves: Secondary | ICD-10-CM | POA: Insufficient documentation

## 2021-02-15 DIAGNOSIS — Z8249 Family history of ischemic heart disease and other diseases of the circulatory system: Secondary | ICD-10-CM | POA: Insufficient documentation

## 2021-02-15 DIAGNOSIS — N1832 Chronic kidney disease, stage 3b: Secondary | ICD-10-CM | POA: Diagnosis not present

## 2021-02-15 DIAGNOSIS — I130 Hypertensive heart and chronic kidney disease with heart failure and stage 1 through stage 4 chronic kidney disease, or unspecified chronic kidney disease: Secondary | ICD-10-CM | POA: Insufficient documentation

## 2021-02-15 DIAGNOSIS — Z7901 Long term (current) use of anticoagulants: Secondary | ICD-10-CM | POA: Insufficient documentation

## 2021-02-15 DIAGNOSIS — I2510 Atherosclerotic heart disease of native coronary artery without angina pectoris: Secondary | ICD-10-CM | POA: Insufficient documentation

## 2021-02-15 DIAGNOSIS — Z79899 Other long term (current) drug therapy: Secondary | ICD-10-CM | POA: Insufficient documentation

## 2021-02-15 DIAGNOSIS — R0602 Shortness of breath: Secondary | ICD-10-CM | POA: Diagnosis not present

## 2021-02-15 DIAGNOSIS — I10 Essential (primary) hypertension: Secondary | ICD-10-CM

## 2021-02-15 DIAGNOSIS — Z20822 Contact with and (suspected) exposure to covid-19: Secondary | ICD-10-CM | POA: Insufficient documentation

## 2021-02-15 DIAGNOSIS — R0789 Other chest pain: Secondary | ICD-10-CM | POA: Diagnosis not present

## 2021-02-15 DIAGNOSIS — I5032 Chronic diastolic (congestive) heart failure: Secondary | ICD-10-CM

## 2021-02-15 DIAGNOSIS — I480 Paroxysmal atrial fibrillation: Secondary | ICD-10-CM | POA: Diagnosis not present

## 2021-02-15 DIAGNOSIS — I252 Old myocardial infarction: Secondary | ICD-10-CM | POA: Insufficient documentation

## 2021-02-15 DIAGNOSIS — Z833 Family history of diabetes mellitus: Secondary | ICD-10-CM | POA: Diagnosis not present

## 2021-02-15 LAB — ECHOCARDIOGRAM COMPLETE
AV Vena cont: 0.2 cm
Area-P 1/2: 3.03 cm2
Calc EF: 59.3 %
S' Lateral: 2.9 cm
Single Plane A2C EF: 57.8 %
Single Plane A4C EF: 61.9 %

## 2021-02-15 LAB — BRAIN NATRIURETIC PEPTIDE: B Natriuretic Peptide: 75.7 pg/mL (ref 0.0–100.0)

## 2021-02-15 MED ORDER — ENTRESTO 49-51 MG PO TABS: 1.0000 | ORAL_TABLET | Freq: Two times a day (BID) | ORAL | 3 refills | Status: DC

## 2021-02-15 NOTE — Progress Notes (Signed)
°  Echocardiogram 2D Echocardiogram has been performed.  Kathleen [name] 02/15/2021, 11:05 AM

## 2021-02-15 NOTE — Patient Instructions (Signed)
Medication Changes:  Decrease Entresto to 49/51 (1 Tab) Twice daily   Lab Work:  Labs done today, your results will be available in MyChart, we will contact you for abnormal readings.   Testing/Procedures:  none  Referrals:  none  Special Instructions // Education:  Check blood pressure tonight if systolic is lower than 736 do not take evening dose of Entresto.  You can check your blood pressure at home and log it.  If it is consistently below 681 systolic please call the clinic  Follow-Up in: 5 months  At the [facility], you and your health needs are our priority. We have a designated team specialized in the treatment of Heart Failure. This Care Team includes your primary Heart Failure Specialized Cardiologist (physician), Advanced Practice Providers (APPs- Physician Assistants and Nurse Practitioners), and Pharmacist who all work together to provide you with the care you need, when you need it.   You may see any of the following providers on your designated Care Team at your next follow up:  Dr Glori Bickers Dr Haynes Kerns, NP Lyda Jester, [place] [facility], [place] Audry Riles, PharmD   Please be sure to bring in all your medications bottles to every appointment.   Need to Contact us:  If you have any questions or concerns before your next appointment please send [place] a message through [facility] or call our office at [phone].    TO LEAVE A MESSAGE FOR THE NURSE SELECT OPTION 2, PLEASE LEAVE A MESSAGE INCLUDING: YOUR NAME DATE OF BIRTH CALL BACK NUMBER REASON FOR CALL**this is important as we prioritize the call backs  YOU WILL RECEIVE A CALL BACK THE SAME DAY AS LONG AS YOU CALL BEFORE 4:00 PM

## 2021-02-17 ENCOUNTER — Other Ambulatory Visit: Payer: Self-pay | Admitting: Dermatology

## 2021-02-18 ENCOUNTER — Telehealth (HOSPITAL_COMMUNITY): Payer: Self-pay

## 2021-02-18 LAB — DERMPATH, SPECIMEN A

## 2021-02-18 LAB — DERMATOPATHOLOGY REPORT

## 2021-02-18 NOTE — Telephone Encounter (Addendum)
Pt aware, agreeable, and verbalized understanding   ----- Message from Rafael Bihari, FNP sent at 02/16/2021 12:40 PM EST ----- BNP looks good. She needs a BMET, looks like BMET was hemolyzed from yesterday.

## 2021-02-22 ENCOUNTER — Other Ambulatory Visit: Payer: Medicare Other

## 2021-02-23 ENCOUNTER — Other Ambulatory Visit: Payer: Self-pay | Admitting: Obstetrics and Gynecology

## 2021-02-23 ENCOUNTER — Telehealth (HOSPITAL_COMMUNITY): Payer: Self-pay | Admitting: Cardiology

## 2021-02-23 DIAGNOSIS — Z803 Family history of malignant neoplasm of breast: Secondary | ICD-10-CM

## 2021-02-23 NOTE — Telephone Encounter (Signed)
Pt aware of echo results and voiced understanding

## 2021-02-24 ENCOUNTER — Ambulatory Visit (HOSPITAL_COMMUNITY)
Admission: RE | Admit: 2021-02-24 | Discharge: 2021-02-24 | Disposition: A | Discharge status: Home / Self Care | Payer: Medicare PPO | Source: Ambulatory Visit | Attending: Internal Medicine | Admitting: Internal Medicine

## 2021-02-24 ENCOUNTER — Other Ambulatory Visit (HOSPITAL_COMMUNITY): Payer: Self-pay

## 2021-02-24 ENCOUNTER — Other Ambulatory Visit: Payer: Self-pay

## 2021-02-24 DIAGNOSIS — I5032 Chronic diastolic (congestive) heart failure: Secondary | ICD-10-CM | POA: Diagnosis present

## 2021-02-24 LAB — BASIC METABOLIC PANEL
Anion gap: 10 (ref 5–15)
BUN: 27 mg/dL — ABNORMAL HIGH (ref 8–23)
CO2: 27 mmol/L (ref 22–32)
Calcium: 9.2 mg/dL (ref 8.9–10.3)
Chloride: 103 mmol/L (ref 98–111)
Creatinine, Ser: 1.3 mg/dL — ABNORMAL HIGH (ref 0.44–1.00)
GFR, Estimated: 46 mL/min — ABNORMAL LOW (ref >60)
Glucose, Bld: 156 mg/dL — ABNORMAL HIGH (ref 70–99)
Potassium: 3.8 mmol/L (ref 3.5–5.1)
Sodium: 140 mmol/L (ref 135–145)

## 2021-02-28 ENCOUNTER — Encounter: Payer: Medicare PPO | Admitting: Hematology and Oncology

## 2021-02-28 ENCOUNTER — Other Ambulatory Visit: Payer: Medicare PPO

## 2021-03-01 ENCOUNTER — Encounter: Payer: Self-pay | Admitting: Hematology and Oncology

## 2021-03-01 ENCOUNTER — Other Ambulatory Visit: Payer: Self-pay

## 2021-03-01 ENCOUNTER — Inpatient Hospital Stay: Payer: Medicare PPO | Attending: Hematology and Oncology | Admitting: Hematology and Oncology

## 2021-03-01 ENCOUNTER — Inpatient Hospital Stay: Payer: Medicare PPO

## 2021-03-01 DIAGNOSIS — R922 Inconclusive mammogram: Secondary | ICD-10-CM | POA: Insufficient documentation

## 2021-03-01 DIAGNOSIS — Z9189 Other specified personal risk factors, not elsewhere classified: Secondary | ICD-10-CM | POA: Diagnosis not present

## 2021-03-01 DIAGNOSIS — N6001 Solitary cyst of right breast: Secondary | ICD-10-CM | POA: Diagnosis not present

## 2021-03-01 DIAGNOSIS — Z86718 Personal history of other venous thrombosis and embolism: Secondary | ICD-10-CM | POA: Insufficient documentation

## 2021-03-01 DIAGNOSIS — Z79899 Other long term (current) drug therapy: Secondary | ICD-10-CM | POA: Diagnosis not present

## 2021-03-01 DIAGNOSIS — N6002 Solitary cyst of left breast: Secondary | ICD-10-CM | POA: Diagnosis not present

## 2021-03-01 DIAGNOSIS — Z803 Family history of malignant neoplasm of breast: Secondary | ICD-10-CM | POA: Insufficient documentation

## 2021-03-01 NOTE — Assessment & Plan Note (Addendum)
This is a very pleasant 66 year old female patient with family history significant for breast cancer in mom, maternal grandmother and paternal aunt who had genetic testing about 2 years ago which was reported negative per patient, has been doing screening MRIs at least every 2 years because of breast density category C who was referred to high risk breast clinic for any additional recommendations.  We have calculated her Tyrer-Cuzick score which comes to a lifetime risk of 29% hence I agree with screening mammograms preferably alternating with MRIs every year.  We have discussed unknown long-term risk of gadolinium deposition with multiple MRIs and increased sensitivity of MRIs which may lead to additional biopsies.  With regards to Gail's risk to discuss endocrine prevention, her 5-year risk was calculated at 4% which will qualify her for consideration of endocrine prevention.  I have discussed about tamoxifen as well as Arimidex today. She has had personal history of DVTs and family history of blood clotting disorder as well as a heart attack in the past, hence concerned about taking tamoxifen.  We have discussed about mechanism of action of aromatase inhibitors, adverse effects from aromatase inhibitors including postmenopausal symptoms, arthralgias, myalgias, increased cardiovascular risk as well as worsening osteoporosis.  She would like to think about it since there is no survival benefit with endocrine prevention.   We have printed information about tamoxifen and Arimidex and handed over to the patient.  She was offered follow-up in 6 months for physical exam, recently had breast exam in January..  She will try to follow-up with her gynecologist as well if she decides not to pursue with antiestrogen therapy and would like to continue MRIs and mammograms with her gynecologist.  She was instructed to call us with any other questions or concerns.
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
## 2021-03-02 ENCOUNTER — Ambulatory Visit
Admission: RE | Admit: 2021-03-02 | Discharge: 2021-03-02 | Disposition: A | Discharge status: Home / Self Care | Payer: Medicare PPO | Source: Ambulatory Visit | Attending: Obstetrics and Gynecology | Admitting: Obstetrics and Gynecology

## 2021-03-02 DIAGNOSIS — Z803 Family history of malignant neoplasm of breast: Secondary | ICD-10-CM

## 2021-03-02 MED ORDER — GADOBUTROL 1 MMOL/ML IV SOLN
10.0000 mL | Freq: Once | INTRAVENOUS | Status: AC | PRN
  Administered 2021-03-02: 10 mL via INTRAVENOUS

## 2021-03-12 ENCOUNTER — Other Ambulatory Visit: Payer: Self-pay | Admitting: Family Medicine

## 2021-03-16 ENCOUNTER — Other Ambulatory Visit (HOSPITAL_COMMUNITY): Payer: Self-pay | Admitting: *Deleted

## 2021-03-16 ENCOUNTER — Telehealth (HOSPITAL_COMMUNITY): Payer: Self-pay | Admitting: *Deleted

## 2021-03-16 NOTE — Telephone Encounter (Signed)
Pt called stating Kathleen Katz, FNP told pt to report her bp if her systolic bp was less than 100. Pt said Sunday her bp was 86/66 and she was too dizzy to drive. Monday bp 99/59. Tuesday and today bp back normal 124/60.  Pt is no longer dizzy.

## 2021-03-17 ENCOUNTER — Other Ambulatory Visit (HOSPITAL_COMMUNITY): Payer: Self-pay

## 2021-03-17 MED ORDER — ENTRESTO 24-26 MG PO TABS: 1.0000 | ORAL_TABLET | Freq: Two times a day (BID) | ORAL | 11 refills | Status: DC

## 2021-03-17 NOTE — Telephone Encounter (Signed)
Patient advised and verbalized understanding. Med list updated to reflect changes.  ? ?

## 2021-03-25 ENCOUNTER — Encounter: Payer: Self-pay | Admitting: Family Medicine

## 2021-03-27 NOTE — Patient Instructions (Addendum)
Good to see you again today  ?Assuming all is ok please see me in about 6 months  ?I will be in touch with your A1c and x-ray reports ?Referral made to Dr Gaynelle Arabian- they should call you but also ok to call and schedule an appt  ?Address: [address], [place], [place] [address] ? ?Phone: [phone] ?

## 2021-03-27 NOTE — Progress Notes (Signed)
[facility] [address] [place], [place] [address] [phone]  Date:  03/30/2021   Name:  Kathleen [name]   DOB:  1955-04-25   MRN:  [id number]  PCP:  Darreld Mclean, MD    Chief Complaint: 6 month follow up (Concerns/ questions: 1. Asks about covid booster. /Foot exam due)   History of Present Illness:  Kathleen [name] is a 66 y.o. very pleasant female patient who presents with the following:  Pt seen today for periodic follow-up- h/o morbid obesity, chronic diastolic HF, DM2, hypothyroidism, hyperparathyroidism status post parathyroidectomy, HTN, HL and CAD with NSTEMI 2009. Sleep study 11/2013 was negative. Also has history of A fib, bipolar disorder managed by  Stage 3 CKD managed by [facility] - she sees them every 6 months- appt coming up   Last seen by myself in September for worsening sx of venous stasis which we treated with Unn[place].  Her venous stasis has been under better control since then  She had covid in November - she recovered fully   Foot exam is due  She is at increased risk of breast cancer and is seeing oncology; they plan to alternate mammo with MRI every other year  She was offered tamoxifen for 10 years but has decided against this   Today she also notes right knee pain.  About 2 weeks ago she went to bed feeling fine, woke up with significant right knee pain in the morning.  She is not aware of any injury or other potential cause.  The knee is improved now, but still bothers her.  She would like to see orthopedics, request to see Dr. Wynelle Link  Seen in CHF clinic in January: 1. Chronic diastolic HF:  - Echo 09/9355 EF Normal EF 55-60% Grade IDD  - Echo 7/19 EF 55-60%  - Echo 2/21 EF 60-65%, RV ok  - Echo today 02/15/21, results pending. - Stable NYHA II. Volume looks good today. She looks good today! - BP low, decrease Entresto back to 49/51 mg bid. - Continue Lasix 40 mg bid.  -  Continue Jardiance 10 mg daily.  - Continue metoprolol 25 mg bid . - Check BP tonight, if sBP <100, hold evening dose of Entresto. - BMET on 12/22 reviewed and ok, SCr1.44, K 4.4 - BNP today   Lab Results  Component Value Date   HGBA1C 7.0 (H) 09/29/2020   Lab Results  Component Value Date   TSH 3.04 09/29/2020    Patient Active Problem List   Diagnosis Date Noted   At increased risk of breast cancer 03/01/2021   Osteopenia 12/02/2019   AKI (acute kidney injury) ([facility])    CAP (community acquired pneumonia) 03/01/2019   Atrial fibrillation with RVR ([place]) 02/28/2019   Chronic kidney disease (CKD), stage III (moderate) ([place]) 01/14/2019   Vitamin D deficiency 07/22/2018   Complete rotator cuff tear or rupture of right shoulder, not specified as traumatic 02/27/2017   Auditory hallucination 12/13/2016   Gout 06/29/2016   Type 2 diabetes mellitus ([place]) 12/31/2015   Coronary artery dissection    Lung nodule seen on imaging study 09/24/2014   Paroxysmal atrial fibrillation ([place]) 09/11/2014   Asthmatic bronchitis with exacerbation 05/25/2014   Primary hyperparathyroidism ([place]) 04/15/2014   Chronic diastolic heart failure ([place]) 10/02/2013   Shortness of breath 08/31/2013   Venous stasis dermatitis of both lower extremities 08/19/2013   Nocturia 06/12/2013   Morbid obesity (  [place]) 08/09/2012   Urinary incontinence, urge 07/11/2012   CAD (coronary artery disease) 10/31/2010   NEOPLASM OF UNCERTAIN BEHAVIOR OF SKIN 02/11/2010   Bipolar disorder ([place]) 09/14/2009   ACUT MYOCARD INFARCT UNS SITE SUBSQT EPIS CARE 07/02/2008   Hyperlipidemia 05/23/2008   UNSPECIFIED DISORDER OF THYROID 02/04/2008   HYPERTENSION, BENIGN ESSENTIAL 02/04/2008   MYOCARDIAL INFARCTION, HX OF 03/05/2007    Past Medical History:  Diagnosis Date   Anemia    Anxiety    Asthma    Atrial fibrillation (HCC)    Bipolar disorder (HCC)    CAD (coronary artery disease)    Cancer (HCC)    basal cell carcinoma on  right hand, papilloma left breast   CHF (congestive heart failure) ([place])    pt seen at heart/vascular spec clinic 08/14/2014    Chronic kidney disease    Coarse tremors    hands   Coronary artery disease    Depression    DVT (deep vein thrombosis) in pregnancy    pt. 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[place]) 02/2007   non-q-wave with second septal perforator   Hyperlipidemia    Hypertension    Hypothyroidism    history of took medication, has resolved   Knee pain, left    Lower extremity deep venous thrombosis (HCC)    20 years ago    Measles    hx of in childhood    Morbid obesity with BMI of 40.0-44.9, adult ([place])    Pain at surgical incision    Left breast from procedure 09/11/16   Pneumonia    hx of    PONV (postoperative nausea and vomiting)    also slow to wake up   Psychiatric hospitalization 05/2008   Shingles    20 years ago    Shortness of breath dyspnea    exercise    Type 2 diabetes mellitus ([place]) 12/31/2015   Urinary incontinence    UTI (urinary tract infection)     Past Surgical History:  Procedure Laterality Date   BASAL CELL CARCINOMA EXCISION     BREAST EXCISIONAL BIOPSY Left 2018   BREAST LUMPECTOMY WITH RADIOACTIVE SEED LOCALIZATION Left 09/11/2016   Procedure: LEFT BREAST LUMPECTOMY WITH RADIOACTIVE SEED LOCALIZATION;  Surgeon: Excell Seltzer, MD;  Location: [facility];  Service: General;  Laterality: Left;   BREAST MASS EXCISION     age 39, benign tumor   CARDIAC CATHETERIZATION     CARDIAC CATHETERIZATION N/A 12/30/2015   Procedure: Left Heart Cath and Coronary Angiography;  Surgeon: Belva Crome, MD;  Location: [facility];  Service: Cardiovascular;  Laterality: N/A;   COLONOSCOPY  07/15/2009   [facility]   DILATATION & CURETTAGE/HYSTEROSCOPY WITH MYOSURE N/A 09/22/2016   Procedure: DILATATION &  CURETTAGE/HYSTEROSCOPY WITH MYOSURE;  Surgeon: Molli Posey, MD;  Location: [facility];  Service: Gynecology;  Laterality: N/A;   DILATION AND CURETTAGE OF UTERUS     KNEE SURGERY     right, removed cartilage   PARATHYROIDECTOMY N/A 08/25/2014   Procedure: PARATHYROIDECTOMY;  Surgeon: Armandina Gemma, MD;  [facility];  Service: General;  Laterality: N/A;   SHOULDER ARTHROSCOPY WITH ROTATOR CUFF REPAIR AND SUBACROMIAL DECOMPRESSION Right 02/27/2017   Procedure: Right shoulder arthroscopic rotator cuff repair with biceps tenodesis and subacromial decompression;  Surgeon: Nicholes Stairs,  MD;  Location: [facility];  Service: Orthopedics;  Laterality: Right;  150 mins   TONSILLECTOMY     TUBAL LIGATION      Social History   Tobacco Use   Smoking status: Never   Smokeless tobacco: Never  Vaping Use   Vaping Use: Never used  Substance Use Topics   Alcohol use: Yes    Alcohol/week: 0.0 standard drinks    Comment: occ.   Drug use: No    Family History  Problem Relation Age of Onset   Breast cancer Mother 12   Alcohol abuse Mother    Alzheimer's disease Mother    Lung cancer Mother    Alcohol abuse Father    Alzheimer's disease Father    Parkinson's disease Father    Alcohol abuse Maternal Grandfather    Drug abuse Maternal Grandfather    Alcohol abuse Maternal Grandmother    Alcohol abuse Paternal Grandfather    Alcohol abuse Paternal Grandmother    Schizophrenia Cousin    Diabetes Brother    Hypertension Brother    Colon cancer Neg Hx    Esophageal cancer Neg Hx    Rectal cancer Neg Hx    Stomach cancer Neg Hx     Allergies  Allergen Reactions   Eggs Or Egg-Derived Products Hives and Other (See Comments)    Can eat foods with cooked eggs; CANNOT handle when part of flu vaccine, etc.    Tetanus Toxoids     Vigorous local reaction to tdap with local pain and itching    Lamictal [Lamotrigine] Hives and Rash    Medication list has been reviewed and  updated.  Current Outpatient Medications on File Prior to Visit  Medication Sig Dispense Refill   allopurinol (ZYLOPRIM) 100 MG tablet TAKE TWO TABLETS BY MOUTH DAILY 180 tablet 1   benztropine (COGENTIN) 1 MG tablet Take 1 mg by mouth 2 (two) times daily.  2   Biotin w/ Vitamins C & E (HAIR/SKIN/NAILS PO) Take by mouth.     calcitRIOL (ROCALTROL) 0.25 MCG capsule TAKE ONE CAPSULE BY MOUTH DAILY 65 capsule 2   COVID-19 mRNA bivalent vaccine, Pfizer, injection Inject into the muscle. 0.3 mL 0   divalproex (DEPAKOTE ER) 500 MG 24 hr tablet Take 1,000 mg by mouth at bedtime.     empagliflozin (JARDIANCE) 10 MG TABS tablet Take 1 tablet (10 mg total) by mouth daily. 90 tablet 3   furosemide (LASIX) 80 MG tablet Take 0.5 tablets (40 mg total) by mouth 2 (two) times daily. Can take an additional '40mg'$  as needed for weight gain of 3 pounds for more in 24 hours 135 tablet 3   LATUDA 60 MG TABS Take 60 mg by mouth at bedtime.      levalbuterol (XOPENEX HFA) 45 MCG/ACT inhaler Inhale 2 puffs into the lungs every 4 (four) hours as needed for wheezing. 1 Inhaler 1   levothyroxine (SYNTHROID) 50 MCG tablet Take 1 tablet (50 mcg total) by mouth daily before breakfast. 90 tablet 0   metFORMIN (GLUCOPHAGE) 500 MG tablet TAKE ONE TABLET BY MOUTH TWICE A DAY WITH MEALS 180 tablet 1   metoprolol tartrate (LOPRESSOR) 25 MG tablet TAKE ONE TABLET BY MOUTH TWICE A DAY 180 tablet 1   nitroGLYCERIN (NITROSTAT) 0.4 MG SL tablet Place 1 tablet (0.4 mg total) under the tongue every 5 (five) minutes as needed for chest pain. For chest pain 25 tablet 3   OLANZapine (ZYPREXA) 15 MG tablet Take 15 mg by  mouth at bedtime.     rosuvastatin (CRESTOR) 20 MG tablet TAKE ONE TABLET BY MOUTH DAILY 90 tablet 2   sacubitril-valsartan (ENTRESTO) 24-26 MG Take 1 tablet by mouth 2 (two) times daily. 60 tablet 11   UNABLE TO FIND Med Name: One Touch Verio blood glucose meter Lot #[id number] x Exp 01/22/2018     XARELTO 20 MG TABS tablet  TAKE ONE TABLET BY MOUTH AT BEDTIME 85 tablet 3   No current facility-administered medications on file prior to visit.    Review of Systems:  As per HPI- otherwise negative.   Physical Examination: Vitals:   03/30/21 1013  BP: 122/70  Pulse: 75  Resp: 18  Temp: 98.2 F (36.8 C)  SpO2: 98%   Vitals:   03/30/21 1013  Weight: 269 lb 12.8 oz (122.4 kg)  Height: '5\' 11"'$  (1.803 m)   Body mass index is 37.63 kg/m. Ideal Body Weight: Weight in (lb) to have BMI = 25: 178.9  GEN: no acute distress. HEENT: Atraumatic, Normocephalic.  Ears and Nose: No external deformity. CV: RRR, No M/G/R. No JVD. No thrill. No extra heart sounds. PULM: CTA B, no wheezes, crackles, rhonchi. No retractions. No resp. distress. No accessory muscle use. ABD: S, NT, ND, +BS. No rebound. No HSM. EXTR: No c/c/e PSYCH: Normally interactive. Conversant.  Foot exam:normal  Take your lower extremity edema today, though venous stasis dermatitis is present Right knee: No gross abnormality on exam.  No effusion or redness is noted.  Patient notes some medial joint line discomfort Assessment and Plan: Type 2 diabetes mellitus without complication, unspecified whether long term insulin use (HCC) - Plan: Hemoglobin A1c  Acquired hypothyroidism  Essential hypertension, benign  Chronic pain of right knee - Plan: Ambulatory referral to Orthopedic Surgery, DG Knee Complete 4 Views Right  Patient seen today for follow-up Check on diabetes today, A1c pending Blood pressure under good control, continue current medications.  She follows up regularly with heart failure clinic and also nephrology Referral to orthopedics for further evaluation of right knee.  We will obtain plain films today   Signed Lamar Blinks, MD

## 2021-03-30 ENCOUNTER — Ambulatory Visit: Payer: Medicare PPO | Admitting: Family Medicine

## 2021-03-30 ENCOUNTER — Ambulatory Visit (HOSPITAL_BASED_OUTPATIENT_CLINIC_OR_DEPARTMENT_OTHER)
Admission: RE | Admit: 2021-03-30 | Discharge: 2021-03-30 | Disposition: A | Discharge status: Home / Self Care | Payer: Medicare PPO | Source: Ambulatory Visit | Attending: Family Medicine | Admitting: Family Medicine

## 2021-03-30 ENCOUNTER — Encounter: Payer: Self-pay | Admitting: Family Medicine

## 2021-03-30 ENCOUNTER — Other Ambulatory Visit: Payer: Self-pay

## 2021-03-30 VITALS — BP 122/70 | HR 75 | Temp 98.2°F | Resp 18 | Ht 71.0 in | Wt 289.8 lb

## 2021-03-30 DIAGNOSIS — M25561 Pain in right knee: Secondary | ICD-10-CM | POA: Insufficient documentation

## 2021-03-30 DIAGNOSIS — G8929 Other chronic pain: Secondary | ICD-10-CM

## 2021-03-30 DIAGNOSIS — I10 Essential (primary) hypertension: Secondary | ICD-10-CM

## 2021-03-30 DIAGNOSIS — E119 Type 2 diabetes mellitus without complications: Secondary | ICD-10-CM | POA: Diagnosis not present

## 2021-03-30 DIAGNOSIS — E039 Hypothyroidism, unspecified: Secondary | ICD-10-CM

## 2021-03-30 LAB — HEMOGLOBIN A1C: Hgb A1c MFr Bld: 6.1 % (ref 4.6–6.5)

## 2021-03-30 NOTE — Telephone Encounter (Signed)
Corrected

## 2021-04-12 ENCOUNTER — Other Ambulatory Visit: Payer: Self-pay | Admitting: Family Medicine

## 2021-04-12 DIAGNOSIS — R7989 Other specified abnormal findings of blood chemistry: Secondary | ICD-10-CM
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
## 2021-05-25 DIAGNOSIS — F411 Generalized anxiety disorder: Secondary | ICD-10-CM | POA: Diagnosis not present

## 2021-05-25 DIAGNOSIS — F3112 Bipolar disorder, current episode manic without psychotic features, moderate: Secondary | ICD-10-CM | POA: Diagnosis not present

## 2021-05-26 ENCOUNTER — Encounter: Payer: Self-pay | Admitting: Family Medicine

## 2021-05-26 ENCOUNTER — Ambulatory Visit (INDEPENDENT_AMBULATORY_CARE_PROVIDER_SITE_OTHER): Payer: Medicare PPO | Admitting: Family Medicine

## 2021-05-26 VITALS — BP 120/72 | HR 74 | Temp 98.0°F | Resp 18 | Ht 71.0 in | Wt 290.4 lb

## 2021-05-26 DIAGNOSIS — R4189 Other symptoms and signs involving cognitive functions and awareness: Secondary | ICD-10-CM | POA: Diagnosis not present

## 2021-05-26 DIAGNOSIS — Z0000 Encounter for general adult medical examination without abnormal findings: Secondary | ICD-10-CM

## 2021-05-26 DIAGNOSIS — E119 Type 2 diabetes mellitus without complications: Secondary | ICD-10-CM

## 2021-05-26 DIAGNOSIS — G479 Sleep disorder, unspecified: Secondary | ICD-10-CM

## 2021-05-26 DIAGNOSIS — E039 Hypothyroidism, unspecified: Secondary | ICD-10-CM | POA: Diagnosis not present

## 2021-05-26 LAB — TSH: TSH: 1.92 u[IU]/mL (ref 0.35–5.50)

## 2021-05-26 LAB — COMPREHENSIVE METABOLIC PANEL
ALT: 18 U/L (ref 0–35)
AST: 15 U/L (ref 0–37)
Albumin: 4.6 g/dL (ref 3.5–5.2)
Alkaline Phosphatase: 113 U/L (ref 39–117)
BUN: 28 mg/dL — ABNORMAL HIGH (ref 6–23)
CO2: 30 mEq/L (ref 19–32)
Calcium: 9.6 mg/dL (ref 8.4–10.5)
Chloride: 101 mEq/L (ref 96–112)
Creatinine, Ser: 1.26 mg/dL — ABNORMAL HIGH (ref 0.40–1.20)
GFR: 44.75 mL/min — ABNORMAL LOW (ref >60.00)
Glucose, Bld: 129 mg/dL — ABNORMAL HIGH (ref 70–99)
Potassium: 4.5 mEq/L (ref 3.5–5.1)
Sodium: 142 mEq/L (ref 135–145)
Total Bilirubin: 0.6 mg/dL (ref 0.2–1.2)
Total Protein: 6.8 g/dL (ref 6.0–8.3)

## 2021-05-26 LAB — CBC
HCT: 47.6 % — ABNORMAL HIGH (ref 36.0–46.0)
Hemoglobin: 15.5 g/dL — ABNORMAL HIGH (ref 12.0–15.0)
MCHC: 32.5 g/dL (ref 30.0–36.0)
MCV: 89.1 fl (ref 78.0–100.0)
Platelets: 134 10*3/uL — ABNORMAL LOW (ref 150.0–400.0)
RBC: 5.35 Mil/uL — ABNORMAL HIGH (ref 3.87–5.11)
RDW: 14.4 % (ref 11.5–15.5)
WBC: 5 10*3/uL (ref 4.0–10.5)

## 2021-05-26 NOTE — Addendum Note (Signed)
Addended by: Kem Boroughs D on: 05/26/2021 11:39 AM ? ? Modules accepted: Orders ? ?

## 2021-05-26 NOTE — Patient Instructions (Addendum)
It was good to see you again today!  I will be in touch with your lab work ?I will reach out to Dr. [name] and ask him to see you regarding this loss of consciousness you had in March ?We will also get you referred to neurology to discuss the loss of consciousness as well as your falling out of bed ?You might consider using bed rails to prevent falling out of bed.  Also check around your bed and remove anything that could injure you such as a side table with sharp corners ? ?Continue to exercise regularly ? ?Immunizations are up-to-date except you should be eligible for 1 more dose of COVID-19 booster if you like ? ? ? ?

## 2021-05-30 ENCOUNTER — Other Ambulatory Visit: Payer: Self-pay | Admitting: Family Medicine

## 2021-05-30 DIAGNOSIS — E039 Hypothyroidism, unspecified: Secondary | ICD-10-CM
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
## 2021-06-01 ENCOUNTER — Encounter (HOSPITAL_COMMUNITY): Payer: Self-pay

## 2021-06-01 ENCOUNTER — Ambulatory Visit (HOSPITAL_COMMUNITY)
Admission: RE | Admit: 2021-06-01 | Discharge: 2021-06-01 | Disposition: A | Discharge status: Home / Self Care | Payer: Medicare PPO | Source: Ambulatory Visit | Attending: Family Medicine | Admitting: Family Medicine

## 2021-06-01 ENCOUNTER — Ambulatory Visit (HOSPITAL_COMMUNITY)
Admission: RE | Admit: 2021-06-01 | Discharge: 2021-06-01 | Disposition: A | Discharge status: Home / Self Care | Payer: Medicare PPO | Source: Ambulatory Visit | Attending: Internal Medicine | Admitting: Internal Medicine

## 2021-06-01 ENCOUNTER — Encounter: Payer: Self-pay | Admitting: Family Medicine

## 2021-06-01 VITALS — BP 102/66 | HR 72 | Wt 290.4 lb

## 2021-06-01 DIAGNOSIS — E118 Type 2 diabetes mellitus with unspecified complications: Secondary | ICD-10-CM

## 2021-06-01 DIAGNOSIS — R0683 Snoring: Secondary | ICD-10-CM | POA: Diagnosis not present

## 2021-06-01 DIAGNOSIS — N1832 Chronic kidney disease, stage 3b: Secondary | ICD-10-CM | POA: Insufficient documentation

## 2021-06-01 DIAGNOSIS — I10 Essential (primary) hypertension: Secondary | ICD-10-CM | POA: Diagnosis not present

## 2021-06-01 DIAGNOSIS — N183 Chronic kidney disease, stage 3 unspecified: Secondary | ICD-10-CM

## 2021-06-01 DIAGNOSIS — R55 Syncope and collapse: Secondary | ICD-10-CM

## 2021-06-01 DIAGNOSIS — E1122 Type 2 diabetes mellitus with diabetic chronic kidney disease: Secondary | ICD-10-CM | POA: Insufficient documentation

## 2021-06-01 DIAGNOSIS — I480 Paroxysmal atrial fibrillation: Secondary | ICD-10-CM

## 2021-06-01 DIAGNOSIS — E785 Hyperlipidemia, unspecified: Secondary | ICD-10-CM | POA: Diagnosis not present

## 2021-06-01 DIAGNOSIS — I252 Old myocardial infarction: Secondary | ICD-10-CM | POA: Diagnosis not present

## 2021-06-01 DIAGNOSIS — Z6841 Body Mass Index (BMI) 40.0 and over, adult: Secondary | ICD-10-CM | POA: Insufficient documentation

## 2021-06-01 DIAGNOSIS — I2510 Atherosclerotic heart disease of native coronary artery without angina pectoris: Secondary | ICD-10-CM | POA: Diagnosis not present

## 2021-06-01 DIAGNOSIS — Z7901 Long term (current) use of anticoagulants: Secondary | ICD-10-CM | POA: Diagnosis not present

## 2021-06-01 DIAGNOSIS — Z7984 Long term (current) use of oral hypoglycemic drugs: Secondary | ICD-10-CM | POA: Insufficient documentation

## 2021-06-01 DIAGNOSIS — R42 Dizziness and giddiness: Secondary | ICD-10-CM | POA: Insufficient documentation

## 2021-06-01 DIAGNOSIS — Z79899 Other long term (current) drug therapy: Secondary | ICD-10-CM | POA: Insufficient documentation

## 2021-06-01 DIAGNOSIS — I5032 Chronic diastolic (congestive) heart failure: Secondary | ICD-10-CM | POA: Diagnosis not present

## 2021-06-01 DIAGNOSIS — I130 Hypertensive heart and chronic kidney disease with heart failure and stage 1 through stage 4 chronic kidney disease, or unspecified chronic kidney disease: Secondary | ICD-10-CM | POA: Diagnosis not present

## 2021-06-01 NOTE — Patient Instructions (Signed)
It was great to see you today! ?No medication changes are needed at this time. ? ? ?Your provider has recommended that  you wear a Zio Patch for 14 days.  This monitor will record your heart rhythm for our review.  IF you have any symptoms while wearing the monitor please press the button.  If you have any issues with the patch or you notice a red or orange light on it please call the company at [phone].  Once you remove the patch please mail it back to the company as soon as possible so we can get the results. ? ? ?Keep cardiology follow up as scheduled with Dr [name] ?Be sure to refrain from driving until follow up. ? ? ?Do the following things EVERYDAY: ?Weigh yourself in the morning before breakfast. Write it down and keep it in a log. ?Take your medicines as prescribed ?Eat low salt foods--Limit salt (sodium) to 2000 mg per day.  ?Stay as active as you can everyday ?Limit all fluids for the day to less than 2 liters ? ?At the [facility], you and your health needs are our priority. As part of our continuing mission to provide you with exceptional heart care, we have created designated Provider Care Teams. These Care Teams include your primary Cardiologist (physician) and Advanced Practice Providers (APPs- Physician Assistants and Nurse Practitioners) who all work together to provide you with the care you need, when you need it.  ? ?You may see any of the following providers on your designated Care Team at your next follow up: ?Dr Glori Bickers ?Dr Loralie Champagne ?Darrick Grinder, NP ?Lyda Jester, PA ?Jessica Milford,NP ?Marlyce Huge, PA ?Audry Riles, PharmD ? ? ?Please be sure to bring in all your medications bottles to every appointment.  ? ?

## 2021-06-01 NOTE — Progress Notes (Signed)
ReDS Vest / Clip - 06/01/21 0900   ? ?  ? ReDS Vest / Clip  ? Station Marker C   ? Ruler Value 29   ? ReDS Value Range Low volume   ? ReDS Actual Value 34   ? ?  ?  ? ?  ? ? ?

## 2021-06-02 ENCOUNTER — Encounter: Payer: Self-pay | Admitting: Family Medicine

## 2021-06-02 ENCOUNTER — Telehealth (HOSPITAL_COMMUNITY): Payer: Self-pay | Admitting: *Deleted

## 2021-06-02 DIAGNOSIS — R55 Syncope and collapse: Secondary | ICD-10-CM | POA: Diagnosis not present

## 2021-06-02 NOTE — Telephone Encounter (Signed)
Zio called to report afib avg rate 135bpm. Pt is not symptomatic besides fatigue that she's had for a few days. Per dr.Bensimhon schedule DCCV. Pt aware. Agricultural consultant not available to schedule. Will call tomorrow to schedule per Adline Potter ?

## 2021-06-03 ENCOUNTER — Encounter (HOSPITAL_COMMUNITY): Payer: Self-pay | Admitting: *Deleted

## 2021-06-03 ENCOUNTER — Telehealth (HOSPITAL_COMMUNITY): Payer: Self-pay | Admitting: *Deleted

## 2021-06-03 ENCOUNTER — Other Ambulatory Visit (HOSPITAL_COMMUNITY): Payer: Self-pay | Admitting: *Deleted

## 2021-06-03 DIAGNOSIS — I480 Paroxysmal atrial fibrillation: Secondary | ICD-10-CM

## 2021-06-03 NOTE — Telephone Encounter (Signed)
Called endo charge RN # to schedule DCCV and spoke with Larene Beach. DCCV scheduled for 5/16 at 930am.  ?

## 2021-06-06 ENCOUNTER — Telehealth (HOSPITAL_COMMUNITY): Payer: Self-pay

## 2021-06-06 ENCOUNTER — Encounter: Payer: Self-pay | Admitting: Family Medicine

## 2021-06-06 NOTE — Telephone Encounter (Signed)
Patient called clinic and this RN spoke to her regarding cardioversion scheduled for tomorrow.Medications reviewed that need to be held. Patient aware of not missing any doses of Xarelto. ?Aware of need for person to drive patient to and from procedure. ?

## 2021-06-06 NOTE — Telephone Encounter (Signed)
VM left for patient to call clinic re cardioversion scheduled for tomorrow @ 9:30 ?

## 2021-06-07 ENCOUNTER — Encounter (HOSPITAL_COMMUNITY): Payer: Self-pay | Admitting: General Practice

## 2021-06-07 ENCOUNTER — Ambulatory Visit (HOSPITAL_COMMUNITY)
Admission: RE | Admit: 2021-06-07 | Discharge: 2021-06-07 | Disposition: A | Discharge status: Home / Self Care | Payer: Medicare PPO | Attending: Internal Medicine | Admitting: Internal Medicine

## 2021-06-07 ENCOUNTER — Encounter (HOSPITAL_COMMUNITY)
Admission: RE | Disposition: A | Discharge status: Home / Self Care | Payer: Self-pay | Source: Home / Self Care | Attending: Internal Medicine

## 2021-06-07 ENCOUNTER — Other Ambulatory Visit: Payer: Self-pay

## 2021-06-07 ENCOUNTER — Encounter: Payer: Self-pay | Admitting: Family Medicine

## 2021-06-07 ENCOUNTER — Encounter (HOSPITAL_COMMUNITY): Payer: Self-pay | Admitting: Internal Medicine

## 2021-06-07 DIAGNOSIS — Z538 Procedure and treatment not carried out for other reasons: Secondary | ICD-10-CM | POA: Insufficient documentation

## 2021-06-07 DIAGNOSIS — I4891 Unspecified atrial fibrillation: Secondary | ICD-10-CM | POA: Diagnosis not present

## 2021-06-07 DIAGNOSIS — E119 Type 2 diabetes mellitus without complications: Secondary | ICD-10-CM

## 2021-06-07 SURGERY — CANCELLED PROCEDURE

## 2021-06-07 MED ORDER — ACCU-CHEK GUIDE CONTROL VI LIQD: 1.0000 [drp] | 1 refills | Status: DC

## 2021-06-07 MED ORDER — ACCU-CHEK GUIDE VI STRP: ORAL_STRIP | 12 refills | Status: DC

## 2021-06-07 MED ORDER — ACCU-CHEK SOFTCLIX LANCETS MISC: 12 refills | Status: DC

## 2021-06-07 MED ORDER — ACCU-CHEK GUIDE ME W/DEVICE KIT: 1.0000 | PACK | Freq: Two times a day (BID) | 0 refills | Status: DC

## 2021-06-07 NOTE — Anesthesia Preprocedure Evaluation (Deleted)
Anesthesia Evaluation  ? ? ?Reviewed: ?Allergy & Precautions, Patient's Chart, lab work & pertinent test results ? ?History of Anesthesia Complications ?(+) PONV and history of anesthetic complications ? ?Airway ? ? ? ? ? ? ? Dental ?  ?Pulmonary ?asthma ,  ?  ? ? ? ? ? ? ? Cardiovascular ?hypertension, Pt. on medications and Pt. on home beta blockers ?+ CAD, + Past MI, +CHF and + DVT  ?+ dysrhythmias Atrial Fibrillation + Valvular Problems/Murmurs  ? ? ?'23 TTE - EF 55 to 60%. Grade I diastolic dysfunction (impaired relaxation). Right ventricular systolic function is mildly reduced. Tricuspid valve regurgitation is mild to moderate. Aortic valve regurgitation is mild. ? ?  ?Neuro/Psych ? Headaches, PSYCHIATRIC DISORDERS Anxiety Depression Bipolar Disorder   ? GI/Hepatic ?negative GI ROS, Neg liver ROS,   ?Endo/Other  ?diabetes, Type 2, Oral Hypoglycemic AgentsHypothyroidism Morbid obesity ? Renal/GU ?CRFRenal disease  ?Female GU complaint ? ? ?  ?Musculoskeletal ?negative musculoskeletal ROS ?(+)  ? Abdominal ?  ?Peds ? Hematology ? ?On xarelto ?   ?Anesthesia Other Findings ? ? Reproductive/Obstetrics ? ?  ? ? ? ? ? ? ? ? ? ? ? ? ? ?  ?  ? ? ? ? ? ? ? ? ?Anesthesia Physical ?Anesthesia Plan ? ?ASA: 3 ? ?Anesthesia Plan: General  ? ?Post-op Pain Management: Minimal or no pain anticipated  ? ?Induction: Intravenous ? ?PONV Risk Score and Plan: 4 or greater and Treatment may vary due to age or medical condition and Propofol infusion ? ?Airway Management Planned: Natural Airway and Mask ? ?Additional Equipment: None ? ?Intra-op Plan:  ? ?Post-operative Plan:  ? ?Informed Consent:  ? ?Plan Discussed with: CRNA and Anesthesiologist ? ?Anesthesia Plan Comments:   ? ? ? ? ? ? ?Anesthesia Quick Evaluation ? ?

## 2021-06-07 NOTE — Progress Notes (Signed)
Pt arrived for cardioversion in NSR.  Confirmed on EKG with Dr. [name].  Pt discharged home. ? ?Larene Beach RN ?

## 2021-06-13 ENCOUNTER — Telehealth (HOSPITAL_COMMUNITY): Payer: Self-pay | Admitting: *Deleted

## 2021-06-13 ENCOUNTER — Ambulatory Visit (HOSPITAL_COMMUNITY)
Admission: RE | Admit: 2021-06-13 | Discharge: 2021-06-13 | Disposition: A | Discharge status: Home / Self Care | Payer: Medicare PPO | Source: Ambulatory Visit | Attending: Internal Medicine | Admitting: Internal Medicine

## 2021-06-13 DIAGNOSIS — R55 Syncope and collapse: Secondary | ICD-10-CM

## 2021-06-13 DIAGNOSIS — R42 Dizziness and giddiness: Secondary | ICD-10-CM

## 2021-06-13 DIAGNOSIS — R4189 Other symptoms and signs involving cognitive functions and awareness: Secondary | ICD-10-CM

## 2021-06-13 NOTE — Telephone Encounter (Signed)
Left message to call back  

## 2021-06-13 NOTE — Telephone Encounter (Signed)
Pt left vm stating she can feel her heart going in and out of afib. Pt said it isn't consistent but when she's in afib she can feel her heart racing and she feels tired. Per Adline Potter route message to Dr.Bensimhon\.

## 2021-06-13 NOTE — Telephone Encounter (Signed)
iRhythm called to report pt had an episode of rapid afib on Sunday 5/21 at 9:45 am with rates 144-198 lasting 90 sec, most recent transmission from 5/22 at 6 am shows pt is still in afib with rates 87-142.  Attempted to call pt and Left message to call back

## 2021-06-14 ENCOUNTER — Encounter (HOSPITAL_COMMUNITY): Payer: Self-pay | Admitting: Internal Medicine

## 2021-06-14 ENCOUNTER — Encounter: Payer: Self-pay | Admitting: Family Medicine

## 2021-06-14 NOTE — Telephone Encounter (Signed)
Pt is currently wearing the live zio. Zio was placed 5/10.

## 2021-06-20 NOTE — Progress Notes (Signed)
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 Sleep study 11/2013 was negative.     NSTEMI in 2/09 -> cath with occlusion of septal perforator. Otherwise normal arteries.  Admitted 8/16 for Afib RVR. She converted to sinus tach with dilt drip and was transitioned to Toprol-XL 25 mg daily.    Admitted 7/17 with chest pain. CTA negative for PE.   LHC 12/17 with widely patent coronary arteries. LVEF 55%. Mildly elevated LVEDP.   Admitted 2/21 for atrial fibrillation w/ RVR in the setting of CAP and also w/ a/c diastolic HF. Echo EF 60-65%. Hospital course also notable for AKI. SCr rose to 2.09 during admit but improved down to 1.46 on d/c. D/c wt 330 lb.  Zio patch 3/22 - 3% AF burden.  Echo 1/23 showed EF 55-60%, grade I DD, mildly reduced RV, mild to moderate TR, mild AI.  Recently seen for recurrent AF. DC-CV arranged for 06/07/21 but was in NSR on arrival. Zio placed   Zio 5/23 - AF burden 15%. 3 runs SVT  Here for f/u. Continues to go to [facility] every day. Doing elliptical bike for 30 mins and doing weights. Feels good. No undue SOB. No CP. Still with occasional palpitations. Edema well controlled. Takes extra lasix as needed.    Past Medical History:  Diagnosis Date   Anemia    Anxiety    Asthma    Atrial fibrillation ([place])    Bipolar disorder ([place])    CAD (coronary artery disease)    Cancer (HCC)    basal cell carcinoma on right hand, papilloma left breast   CHF (congestive heart failure) ([place])    pt seen  at heart/vascular spec clinic 08/14/2014    Chronic kidney disease    Coarse tremors    hands   Coronary artery disease    Depression    DVT (deep vein thrombosis) in pregnancy    pt. 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[place]) 02/2007   non-q-wave with second septal perforator   Hyperlipidemia    Hypertension    Hypothyroidism    history of took medication, has resolved   Knee pain, left    Lower extremity deep venous thrombosis (HCC)    20 years ago    Measles    hx of in childhood    Morbid obesity with BMI of 40.0-44.9, adult ([place])    Pain at surgical incision    Left breast from procedure 09/11/16   Pneumonia    hx of    PONV (postoperative nausea and vomiting)    also slow to wake up   Psychiatric hospitalization 05/2008   Shingles    20 years ago    Shortness of breath dyspnea    exercise    Type 2 diabetes mellitus ([place]) 12/31/2015  Urinary incontinence    UTI (urinary tract infection)    Past Surgical History:  Procedure Laterality Date   BASAL CELL CARCINOMA EXCISION     BREAST EXCISIONAL BIOPSY Left 2018   BREAST LUMPECTOMY WITH RADIOACTIVE SEED LOCALIZATION Left 09/11/2016   Procedure: LEFT BREAST LUMPECTOMY WITH RADIOACTIVE SEED LOCALIZATION;  Surgeon: Excell Seltzer, MD;  Location: [facility];  Service: General;  Laterality: Left;   BREAST MASS EXCISION     age 12, benign tumor   CARDIAC CATHETERIZATION     CARDIAC CATHETERIZATION N/A 12/30/2015   Procedure: Left Heart Cath and Coronary Angiography;  Surgeon: Belva Crome, MD;  Location: [facility];  Service: Cardiovascular;  Laterality: N/A;   COLONOSCOPY  07/15/2009   [facility]   DILATATION & CURETTAGE/HYSTEROSCOPY WITH MYOSURE N/A 09/22/2016   Procedure: DILATATION & CURETTAGE/HYSTEROSCOPY WITH MYOSURE;  Surgeon: Molli Posey, MD;  Location: [facility];  Service:  Gynecology;  Laterality: N/A;   DILATION AND CURETTAGE OF UTERUS     KNEE SURGERY     right, removed cartilage   PARATHYROIDECTOMY N/A 08/25/2014   Procedure: PARATHYROIDECTOMY;  Surgeon: Armandina Gemma, MD;  Location: [facility];  Service: General;  Laterality: N/A;   SHOULDER ARTHROSCOPY WITH ROTATOR CUFF REPAIR AND SUBACROMIAL DECOMPRESSION Right 02/27/2017   Procedure: Right shoulder arthroscopic rotator cuff repair with biceps tenodesis and subacromial decompression;  Surgeon: Nicholes Stairs, MD;  Location: [facility];  Service: Orthopedics;  Laterality: Right;  150 mins   TONSILLECTOMY     TUBAL LIGATION     Current Outpatient Medications  Medication Sig Dispense Refill   Accu-Chek Softclix Lancets lancets Use as instructed E11.9 100 each 12   allopurinol (ZYLOPRIM) 100 MG tablet TAKE TWO TABLETS BY MOUTH DAILY 180 tablet 1   benztropine (COGENTIN) 1 MG tablet Take 1 mg by mouth 2 (two) times daily.  2   Biotin w/ Vitamins C & E (HAIR/SKIN/NAILS PO) Take by mouth daily.     Blood Glucose Calibration (ACCU-CHEK GUIDE CONTROL) LIQD 1 drop by In Vitro route as directed. E11.9 1 each 1   Blood Glucose Monitoring Suppl (ACCU-CHEK GUIDE ME) w/Device KIT 1 each by Does not apply route in the morning and at bedtime. E11.9 1 kit 0   calcitRIOL (ROCALTROL) 0.25 MCG capsule TAKE ONE CAPSULE BY MOUTH DAILY 90 capsule 1   Cholecalciferol (VITAMIN D3) 50 MCG (2000 UT) capsule Take 2,000 Units by mouth daily.     divalproex (DEPAKOTE ER) 500 MG 24 hr tablet Take 1,000 mg by mouth at bedtime.     empagliflozin (JARDIANCE) 10 MG TABS tablet Take 1 tablet (10 mg total) by mouth daily. 90 tablet 3   furosemide (LASIX) 80 MG tablet Take 0.5 tablets (40 mg total) by mouth 2 (two) times daily. Can take an additional 22m as needed for weight gain of 3 pounds for more in 24 hours 135 tablet 3   glucose blood (ACCU-CHEK GUIDE) test strip Use as instructed 100 each 12   LATUDA 60 MG TABS Take 60 mg by mouth at  bedtime.      levalbuterol (XOPENEX HFA) 45 MCG/ACT inhaler Inhale 2 puffs into the lungs every 4 (four) hours as needed for wheezing. 1 Inhaler 1   levothyroxine (SYNTHROID) 50 MCG tablet TAKE ONE TABLET BY MOUTH DAILY BEFORE BREAKFAST 90 tablet 1   metFORMIN (GLUCOPHAGE) 500 MG tablet TAKE ONE TABLET BY MOUTH TWICE A DAY WITH MEALS 180 tablet 1   metoprolol tartrate (LOPRESSOR) 25 MG  tablet TAKE ONE TABLET BY MOUTH TWICE A DAY 180 tablet 1   nitroGLYCERIN (NITROSTAT) 0.4 MG SL tablet Place 1 tablet (0.4 mg total) under the tongue every 5 (five) minutes as needed for chest pain. For chest pain 25 tablet 3   OLANZapine (ZYPREXA) 15 MG tablet Take 15 mg by mouth at bedtime.     rosuvastatin (CRESTOR) 20 MG tablet TAKE ONE TABLET BY MOUTH DAILY 90 tablet 2   sacubitril-valsartan (ENTRESTO) 24-26 MG Take 1 tablet by mouth 2 (two) times daily. 60 tablet 11   UNABLE TO FIND Med Name: One Touch Verio blood glucose meter Lot #[id number] x Exp 01/22/2018     XARELTO 20 MG TABS tablet TAKE ONE TABLET BY MOUTH AT BEDTIME 85 tablet 3   No current facility-administered medications for this encounter.    Allergies:   Tetanus-diphtheria toxoids td, Eggs or egg-derived products, Tetanus toxoids, and Lamictal [lamotrigine]   Social History:  The patient  reports that she has never smoked. She has never used smokeless tobacco. She reports current alcohol use. She reports that she does not use drugs.   Family History:  The patient's family history includes Alcohol abuse in her father, maternal grandfather, maternal grandmother, mother, paternal grandfather, and paternal grandmother; Alzheimer's disease in her father and mother; Breast cancer (age of onset: 75) in her mother; Diabetes in her brother; Drug abuse in her maternal grandfather; Hypertension in her brother; Lung cancer in her mother; Parkinson's disease in her father; Schizophrenia in her cousin.   ROS:  Please see the history of present illness.   All  other systems are personally reviewed and negative.   Recent Labs: 02/15/2021: B Natriuretic Peptide 75.7 05/26/2021: ALT 18; BUN 28; Creatinine, Ser 1.26; Hemoglobin 15.5; Platelets 134.0; Potassium 4.5; Sodium 142; TSH 1.92  Personally reviewed   Physical Exam General:  Well appearing. No resp difficulty HEENT: normal Neck: supple. no JVD. Carotids 2+ bilat; no bruits. No lymphadenopathy or thryomegaly appreciated. Cor: PMI nondisplaced. Regular rate & rhythm. No rubs, gallops or murmurs. Lungs: clear Abdomen: obese soft, nontender, nondistended. No hepatosplenomegaly. No bruits or masses. Good bowel sounds. Extremities: no cyanosis, clubbing, rash, edema Neuro: alert & orientedx3, cranial nerves grossly intact. moves all 4 extremities w/o difficulty. Affect pleasant   ASSESSMENT AND PLAN:  1. Syncope - No further events since March episode. - She has Neurology follow up - Zio & Carotid u/s ok - Recent labs ok. - ? Hypoglycemic vs vagal event. - No driving or 6 months from event  2. Chronic diastolic HF:  - Echo 02/9796 EF Normal EF 55-60% Grade IDD  - Echo 7/19 EF 55-60%  - Echo 2/21 EF 60-65%, RV ok  - Echo 1/23 EF 50-65%, RV ok. - Stable NYHA II Volume looks good today. Doing well with sliding scale lasix - Continue Entresto 49/51 mg bid. - Continue Lasix 80 mg bid.  - Continue Jardiance 10 mg daily.  - Continue metoprolol 25 mg bid. - No BP room to add spiro today. - Recent labs ok, SCr 1.26, K 4.5  3. PAF - Zio patch 3/22 - 3% AF burden - Continue metoprolol - CHADSVASC = 5 - Continue Xarelto. No bleeding. Recent CBC stable. - In 5/23 had recurrent AF. DC-CV arranged but was in NSR on arrival  - Repeat Zio 5/23 - AF burden 15% (average rate 128 in AF. 81 when in sinus). 3 runs SVT - AF burden increased. -> Increase metoprolol to 50 bid. Repeat sleep study.  REfer to AF Clinic to discuss options  4. CAD:  - No s/s angina - No ASA w/ Xarelto.  - Continue statin.      5. HTN:   - BP stable. - Continue current meds.  6. DM2 - Continue Jardiance 10 mg daily. - Continue metformin.  - HgbA1c 6.1 (3/23) - Refer for GLP1RA to help with weight loss  7. CKD 3b - Follows with Dr. Posey Pronto. - Continue SGLT2i. - Recent labs ok.  8. Morbid obesity: - Body mass index is 40.64 kg/m. - Continue with exercises at [facility]. - Refer for GLP1RA  9. Snoring:  - No OSA on previous sleep study in 2015 - With progressive AF will need repeat .    Glori Bickers, MD  06/23/2021 11:34 AM  [facility] [place] [address] [phone] (office) [phone] (fax)

## 2021-06-21 ENCOUNTER — Encounter: Payer: Self-pay | Admitting: Family Medicine

## 2021-06-21 NOTE — Addendum Note (Signed)
Encounter addended by: Micki Riley, RN on: 06/21/2021 10:44 AM  Actions taken: Imaging Exam ended

## 2021-06-23 ENCOUNTER — Ambulatory Visit (HOSPITAL_COMMUNITY)
Admission: RE | Admit: 2021-06-23 | Discharge: 2021-06-23 | Disposition: A | Discharge status: Home / Self Care | Payer: Medicare PPO | Source: Ambulatory Visit | Attending: Internal Medicine | Admitting: Internal Medicine

## 2021-06-23 ENCOUNTER — Encounter (HOSPITAL_COMMUNITY): Payer: Self-pay | Admitting: Internal Medicine

## 2021-06-23 VITALS — BP 122/82 | HR 71 | Wt 291.4 lb

## 2021-06-23 DIAGNOSIS — Z833 Family history of diabetes mellitus: Secondary | ICD-10-CM | POA: Diagnosis not present

## 2021-06-23 DIAGNOSIS — E1122 Type 2 diabetes mellitus with diabetic chronic kidney disease: Secondary | ICD-10-CM | POA: Insufficient documentation

## 2021-06-23 DIAGNOSIS — Z79899 Other long term (current) drug therapy: Secondary | ICD-10-CM | POA: Diagnosis not present

## 2021-06-23 DIAGNOSIS — I252 Old myocardial infarction: Secondary | ICD-10-CM | POA: Diagnosis not present

## 2021-06-23 DIAGNOSIS — I480 Paroxysmal atrial fibrillation: Secondary | ICD-10-CM | POA: Insufficient documentation

## 2021-06-23 DIAGNOSIS — I10 Essential (primary) hypertension: Secondary | ICD-10-CM | POA: Diagnosis not present

## 2021-06-23 DIAGNOSIS — N183 Chronic kidney disease, stage 3 unspecified: Secondary | ICD-10-CM | POA: Diagnosis not present

## 2021-06-23 DIAGNOSIS — R0683 Snoring: Secondary | ICD-10-CM | POA: Insufficient documentation

## 2021-06-23 DIAGNOSIS — I2510 Atherosclerotic heart disease of native coronary artery without angina pectoris: Secondary | ICD-10-CM | POA: Insufficient documentation

## 2021-06-23 DIAGNOSIS — Z7901 Long term (current) use of anticoagulants: Secondary | ICD-10-CM | POA: Insufficient documentation

## 2021-06-23 DIAGNOSIS — N1832 Chronic kidney disease, stage 3b: Secondary | ICD-10-CM | POA: Insufficient documentation

## 2021-06-23 DIAGNOSIS — Z6841 Body Mass Index (BMI) 40.0 and over, adult: Secondary | ICD-10-CM | POA: Insufficient documentation

## 2021-06-23 DIAGNOSIS — R55 Syncope and collapse: Secondary | ICD-10-CM | POA: Diagnosis not present

## 2021-06-23 DIAGNOSIS — I130 Hypertensive heart and chronic kidney disease with heart failure and stage 1 through stage 4 chronic kidney disease, or unspecified chronic kidney disease: Secondary | ICD-10-CM | POA: Insufficient documentation

## 2021-06-23 DIAGNOSIS — I5032 Chronic diastolic (congestive) heart failure: Secondary | ICD-10-CM | POA: Diagnosis not present

## 2021-06-23 DIAGNOSIS — Z8249 Family history of ischemic heart disease and other diseases of the circulatory system: Secondary | ICD-10-CM | POA: Insufficient documentation

## 2021-06-23 MED ORDER — METOPROLOL TARTRATE 50 MG PO TABS: 50.0000 mg | ORAL_TABLET | Freq: Two times a day (BID) | ORAL | 6 refills | Status: DC

## 2021-06-23 NOTE — Addendum Note (Signed)
Encounter addended by: Scarlette Calico, RN on: 06/23/2021 2:51 PM  Actions taken: Order list changed, Diagnosis association updated

## 2021-06-23 NOTE — Progress Notes (Signed)
Height:  5'11"    Weight: 291 lb BMI: 40  Today's Date: 06/23/21  STOP BANG RISK ASSESSMENT S (snore) Have you been told that you snore?     YES   T (tired) Are you often tired, fatigued, or sleepy during the day?   YES  O (obstruction) Do you stop breathing, choke, or gasp during sleep? NO   P (pressure) Do you have or are you being treated for high blood pressure? YES   B (BMI) Is your body index greater than 35 kg/m? YES   A (age) Are you 66 years old or older? YES   N (neck) Do you have a neck circumference greater than 16 inches?      G (gender) Are you a female? NO   TOTAL STOP/BANG "YES" ANSWERS 5                                                                       For Office Use Only              Procedure Order Form    YES to 3+ Stop Bang questions OR two clinical symptoms - patient qualifies for WatchPAT (CPT 95800)      Clinical Notes: Will consult Sleep Specialist and refer for management of therapy due to patient increased risk of Sleep Apnea. Ordering a sleep study due to the following two clinical symptoms: Excessive daytime sleepiness G47.10 / Loud snoring R06.83

## 2021-06-23 NOTE — Patient Instructions (Signed)
Medication Changes:  Increase Metoprolol to 50 mg Twice daily   Lab Work:  None  Testing/Procedures:  Your provider has recommended that you have a home sleep study.  We have provided you with the equipment in our office today. Please download the app and follow the instructions. YOUR PIN NUMBER IS: [id number]. Once you have completed the test you just dispose of the equipment, the information is automatically uploaded to [place] via blue-tooth technology. If your test is positive for sleep apnea and you need a home CPAP machine you will be contacted by Dr Theodosia Blender office [facility]) to set this up.  Referrals:  You have been referred to Pharmacy Clinic for weight loss medication, they will call  you to discuss  You have been referred to [facility], they will call you to schedule an appointment  Special Instructions // Education:  Do the following things EVERYDAY: Weigh yourself in the morning before breakfast. Write it down and keep it in a log. Take your medicines as prescribed Eat low salt foods--Limit salt (sodium) to 2000 mg per day.  Stay as active as you can everyday Limit all fluids for the day to less than 2 liters  Follow-Up in: 6 months (December), **PLEASE CALL OUR OFFICE TO SCHEDULE THIS APPOINTMENT IN OCTOBER  At the [facility], you and your health needs are our priority. We have a designated team specialized in the treatment of Heart Failure. This Care Team includes your primary Heart Failure Specialized Cardiologist (physician), Advanced Practice Providers (APPs- Physician Assistants and Nurse Practitioners), and Pharmacist who all work together to provide you with the care you need, when you need it.   You may see any of the following providers on your designated Care Team at your next follow up:  Dr Glori Bickers Dr Haynes Kerns, NP Lyda Jester, [place] [facility] [place], [place] Audry Riles, PharmD   Please be sure to  bring in all your medications bottles to every appointment.   Need to Contact us:  If you have any questions or concerns before your next appointment please send [place] a message through [place] or call our office at [phone].    TO LEAVE A MESSAGE FOR THE NURSE SELECT OPTION 2, PLEASE LEAVE A MESSAGE INCLUDING: YOUR NAME DATE OF BIRTH CALL BACK NUMBER REASON FOR CALL**this is important as we prioritize the call backs  YOU WILL RECEIVE A CALL BACK THE SAME DAY AS LONG AS YOU CALL BEFORE 4:00 PM

## 2021-06-24 ENCOUNTER — Encounter (INDEPENDENT_AMBULATORY_CARE_PROVIDER_SITE_OTHER): Payer: Medicare PPO | Admitting: Cardiology

## 2021-06-24 DIAGNOSIS — G4733 Obstructive sleep apnea (adult) (pediatric): Secondary | ICD-10-CM

## 2021-06-25 ENCOUNTER — Encounter (HOSPITAL_COMMUNITY): Payer: Self-pay | Admitting: Internal Medicine

## 2021-06-27 ENCOUNTER — Ambulatory Visit: Payer: Medicare PPO

## 2021-06-27 DIAGNOSIS — I5032 Chronic diastolic (congestive) heart failure: Secondary | ICD-10-CM

## 2021-06-27 DIAGNOSIS — I480 Paroxysmal atrial fibrillation: Secondary | ICD-10-CM

## 2021-06-27 NOTE — Procedures (Signed)
   SLEEP STUDY REPORT Patient Information Study Date: 06/24/21 Patient Name: Kathleen [name] Patient ID: [id number] Birth Date: 08/21/2055 Age: 66 Gender: Female Referring Physician:Daniel Bensimhon, MD  TEST DESCRIPTION: Home sleep apnea testing was completed using the WatchPat, a Type 1 device, utilizing peripheral arterial tonometry (PAT), chest movement, actigraphy, pulse oximetry, pulse rate, body position and snore. AHI was calculated with apnea and hypopnea using valid sleep time as the denominator. RDI includes apneas, hypopneas, and RERAs. The data acquired and the scoring of sleep and all associated events were performed in accordance with the recommended standards and specifications as outlined in the AASM Manual for the Scoring of Sleep and Associated Events 2.2.0 (2015).  FINDINGS: 1. Mild Obstructive Sleep Apnea with AHI 12.5/hr. 2. No Central Sleep Apnea with pAHIc 0.5/hr. 3. Oxygen desaturations as low as 75%. 4. Moderate snoring was present. O2 sats were < 88% for 33 min. 5. Total sleep time was 7 hrs and 46 min. 6. 29.6% of total sleep time was spent in REM sleep. 7. Prolonged sleep onset latency at 22 min. 8. Normal REM sleep onset latency at 94 min. 9. Total awakenings were 7.  DIAGNOSIS: Mild Obstructive Sleep Apnea (G47.33) Nocturnal Hypoxemia  RECOMMENDATIONS: 1. Clinical correlation of these findings is necessary. The decision to treat obstructive sleep apnea (OSA) is usually based on the presence of apnea symptoms or the presence of associated medical conditions such as Hypertension, Congestive Heart Failure, Atrial Fibrillation or Obesity. The most common symptoms of OSA are snoring, gasping for breath while sleeping, daytime sleepiness and fatigue.  2. Initiating apnea therapy is recommended given the presence of symptoms and/or associated conditions. Recommend proceeding with one of the following:   a. Auto-CPAP therapy with a pressure range of 5-20cm  H2O.   b. An oral appliance (OA) that can be obtained from certain dentists with expertise in sleep medicine. These are primarily of use in non-obese patients with mild and moderate disease.   c. An ENT consultation which may be useful to look for specific causes of obstruction and possible treatment options.   d. If patient is intolerant to PAP therapy, consider referral to ENT for evaluation for hypoglossal nerve stimulator.  3. Close follow-up is necessary to ensure success with CPAP or oral appliance therapy for maximum benefit .  4. A follow-up oximetry study on CPAP is recommended to assess the adequacy of therapy and determine the need for supplemental oxygen or the potential need for Bi-level therapy. An arterial blood gas to determine the adequacy of baseline ventilation and oxygenation should also be considered.  5. Healthy sleep recommendations include: adequate nightly sleep (normal 7-9 hrs/night), avoidance of caffeine after noon and alcohol near bedtime, and maintaining a sleep environment that is cool, dark and quiet.  6. Weight loss for overweight patients is recommended. Even modest amounts of weight loss can significantly improve the severity of sleep apnea.  7. Snoring recommendations include: weight loss where appropriate, side sleeping, and avoidance of alcohol before bed.  8. Operation of motor vehicle should be avoided when sleepy.  Signature: Electronically Signed: 06/27/21 Fransico Him, MD; [facility]; [place], American Board of Sleep Medicine

## 2021-06-28 ENCOUNTER — Encounter: Payer: Self-pay | Admitting: Family Medicine

## 2021-06-30 ENCOUNTER — Ambulatory Visit (HOSPITAL_COMMUNITY)
Admission: RE | Admit: 2021-06-30 | Discharge: 2021-06-30 | Disposition: A | Discharge status: Home / Self Care | Payer: Medicare PPO | Source: Ambulatory Visit | Attending: Physician Assistant | Admitting: Physician Assistant

## 2021-06-30 ENCOUNTER — Telehealth: Payer: Self-pay | Admitting: Cardiology

## 2021-06-30 VITALS — BP 102/66 | HR 67 | Ht 71.0 in | Wt 289.6 lb

## 2021-06-30 DIAGNOSIS — D6869 Other thrombophilia: Secondary | ICD-10-CM | POA: Diagnosis not present

## 2021-06-30 DIAGNOSIS — E119 Type 2 diabetes mellitus without complications: Secondary | ICD-10-CM | POA: Insufficient documentation

## 2021-06-30 DIAGNOSIS — E669 Obesity, unspecified: Secondary | ICD-10-CM | POA: Insufficient documentation

## 2021-06-30 DIAGNOSIS — Z6841 Body Mass Index (BMI) 40.0 and over, adult: Secondary | ICD-10-CM | POA: Diagnosis not present

## 2021-06-30 DIAGNOSIS — E785 Hyperlipidemia, unspecified: Secondary | ICD-10-CM | POA: Insufficient documentation

## 2021-06-30 DIAGNOSIS — Z79899 Other long term (current) drug therapy: Secondary | ICD-10-CM | POA: Diagnosis not present

## 2021-06-30 DIAGNOSIS — I110 Hypertensive heart disease with heart failure: Secondary | ICD-10-CM | POA: Diagnosis not present

## 2021-06-30 DIAGNOSIS — I2510 Atherosclerotic heart disease of native coronary artery without angina pectoris: Secondary | ICD-10-CM | POA: Diagnosis not present

## 2021-06-30 DIAGNOSIS — I5032 Chronic diastolic (congestive) heart failure: Secondary | ICD-10-CM | POA: Insufficient documentation

## 2021-06-30 DIAGNOSIS — I480 Paroxysmal atrial fibrillation: Secondary | ICD-10-CM | POA: Diagnosis not present

## 2021-06-30 DIAGNOSIS — G4733 Obstructive sleep apnea (adult) (pediatric): Secondary | ICD-10-CM | POA: Diagnosis not present

## 2021-06-30 NOTE — Telephone Encounter (Signed)
Patient needs to schedule her appt with Dr. Radford Pax, follow up to her sleep study.
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
## 2021-07-01 ENCOUNTER — Encounter: Payer: Self-pay | Admitting: Family Medicine

## 2021-07-05 ENCOUNTER — Telehealth: Payer: Self-pay | Admitting: *Deleted

## 2021-07-05 NOTE — Telephone Encounter (Signed)
-----   Message from Sueanne Margarita, MD sent at 06/27/2021  7:43 PM EDT ----- Please let patient know that they have sleep apnea and recommend treating with CPAP.  Please order an auto CPAP from 4-15cm H2O with heated humidity and mask of choice.  Order overnight pulse ox on CPAP.  Followup with me in 6 weeks.

## 2021-07-05 NOTE — Telephone Encounter (Signed)
Patient notified of itamar sleep test results.  She wants me to ask Dr Radford Pax if CPAP is her only choice of treatment. She asked about a oral device as well as the inspire device. I informed her that she would not be a candidate for the inspire at this time due to her BMI  being 40.39. She would also have to try CPAP first. She then asked if a oral dental appliance would be an option. I told her that I will need to defer this question to the MD. Since her O2 dropped I do not know if this will be an option, however once the doctor responds to the message I will call her again with her recommendation. Patient voiced understanding and will await a call.

## 2021-07-12 ENCOUNTER — Encounter: Payer: Self-pay | Admitting: Pharmacist

## 2021-07-12 ENCOUNTER — Ambulatory Visit: Payer: Medicare PPO | Admitting: Pharmacist

## 2021-07-12 VITALS — BP 89/56 | HR 65 | Wt 292.0 lb

## 2021-07-12 DIAGNOSIS — E118 Type 2 diabetes mellitus with unspecified complications: Secondary | ICD-10-CM | POA: Insufficient documentation

## 2021-07-12 DIAGNOSIS — E119 Type 2 diabetes mellitus without complications: Secondary | ICD-10-CM

## 2021-07-12 MED ORDER — MOUNJARO 2.5 MG/0.5ML ~~LOC~~ SOAJ: 2.5000 mg | SUBCUTANEOUS | 0 refills | Status: DC

## 2021-07-12 NOTE — Patient Instructions (Addendum)
It was nice meeting you today  We will start a new medication called Mounjaro.    Your starting dose will be 2.'5mg'$  once a week for 4 weeks and then will increase to '5mg'$  once a week  I will complete the prior authorization for you and contact you when it is approved  Continue to watch your diet.  Try to focus on smaller portion sizes and limiting high fat meals  Continue your exercise schedule with the [facility]  Please call with any questions  Karren Cobble, PharmD, [place], [place], [place], [place], [place], [address] Phone: [phone], Fax: [phone]

## 2021-07-12 NOTE — Progress Notes (Signed)
Patient ID: Kathleen [name]                 DOB: October 02, 1955                    MRN: [id number]     HPI: Kathleen [name] is a 66 y.o. female patient referred to pharmacy clinic by Dr [name] to initiate weight loss therapy with GLP1-RA. PMH is significant for obesity, T2DM, HTN, CAD, A fib, biopolar disorder and obesity. Most recent BMI 40.73.  Patient presents today in good spirits. DM is well controlled and patient is very physicially active. Spends 1 hour [facility] at the [facility]. Does 30 minutes of cardio and then 30 minutes of different exercises.  Checks blood sugar every morning, reports readings always under 130.  Has not tried any weight loss medications before  Current meds that may affect weight:   Jardiance Levothyroxine Latuda Zyprexa Lasix  Baseline weight/BMI: 40.74  Insurance payor: [employer]  Labs: Lab Results  Component Value Date   HGBA1C 6.1 03/30/2021    Wt Readings from Last 1 Encounters:  06/30/21 289 lb 9.6 oz (131.4 kg)    BP Readings from Last 1 Encounters:  06/30/21 102/66   Pulse Readings from Last 1 Encounters:  06/30/21 67       Component Value Date/Time   CHOL 138 09/29/2020 1042   TRIG 124.0 09/29/2020 1042   HDL 44.60 09/29/2020 1042   CHOLHDL 3 09/29/2020 1042   VLDL 24.8 09/29/2020 1042   Esto 69 09/29/2020 1042   LDLDIRECT 166.0 08/03/2016 0834    Past Medical History:  Diagnosis Date   Anemia    Anxiety    Asthma    Atrial fibrillation (HCC)    Bipolar disorder (HCC)    CAD (coronary artery disease)    Cancer ([place])    basal cell carcinoma on right hand, papilloma left breast   CHF (congestive heart failure) ([place])    pt seen at heart/vascular spec clinic 08/14/2014    Chronic kidney disease    Coarse tremors    hands   Coronary artery disease    Depression    DVT (deep vein thrombosis) in pregnancy    pt. reports that it was post knee surgery, not during pregnancy   Dyslipidemia    Fall    Fatty liver     Headache    migraines - stopped after menopause   Heart attack ([facility]) 02/2007   non-q-wave with second septal perforator   Hyperlipidemia    Hypertension    Hypothyroidism    history of took medication, has resolved   Knee pain, left    Lower extremity deep venous thrombosis (HCC)    20 years ago    Measles    hx of in childhood    Morbid obesity with BMI of 40.0-44.9, adult ([place])    Pain at surgical incision    Left breast from procedure 09/11/16   Pneumonia    hx of    PONV (postoperative nausea and vomiting)    also slow to wake up   Psychiatric hospitalization 05/2008   Shingles    20 years ago    Shortness of breath dyspnea    exercise    Type 2 diabetes mellitus ([facility]) 12/31/2015   Urinary incontinence    UTI (urinary tract infection)     Current Outpatient Medications on File Prior to Visit  Medication Sig Dispense Refill   Accu-Chek Softclix Lancets lancets  Use as instructed E11.9 100 each 12   allopurinol (ZYLOPRIM) 100 MG tablet TAKE TWO TABLETS BY MOUTH DAILY 180 tablet 1   benztropine (COGENTIN) 1 MG tablet Take 1 mg by mouth 2 (two) times daily.  2   Biotin w/ Vitamins C & E (HAIR/SKIN/NAILS PO) Take 1 tablet by mouth daily.     Blood Glucose Calibration (ACCU-CHEK GUIDE CONTROL) LIQD 1 drop by In Vitro route as directed. E11.9 1 each 1   Blood Glucose Monitoring Suppl (ACCU-CHEK GUIDE ME) w/Device KIT 1 each by Does not apply route in the morning and at bedtime. E11.9 1 kit 0   calcitRIOL (ROCALTROL) 0.25 MCG capsule TAKE ONE CAPSULE BY MOUTH DAILY 90 capsule 1   Cholecalciferol (VITAMIN D3) 50 MCG (2000 UT) capsule Take 2,000 Units by mouth daily.     divalproex (DEPAKOTE ER) 500 MG 24 hr tablet Take 1,000 mg by mouth at bedtime.     empagliflozin (JARDIANCE) 10 MG TABS tablet Take 1 tablet (10 mg total) by mouth daily. 90 tablet 3   furosemide (LASIX) 80 MG tablet Take 0.5 tablets (40 mg total) by mouth 2 (two) times daily. Can take an additional 20m as needed  for weight gain of 3 pounds for more in 24 hours 135 tablet 3   glucose blood (ACCU-CHEK GUIDE) test strip Use as instructed 100 each 12   LATUDA 60 MG TABS Take 60 mg by mouth at bedtime.      levalbuterol (XOPENEX HFA) 45 MCG/ACT inhaler Inhale 2 puffs into the lungs every 4 (four) hours as needed for wheezing. 1 Inhaler 1   levothyroxine (SYNTHROID) 50 MCG tablet TAKE ONE TABLET BY MOUTH DAILY BEFORE BREAKFAST 90 tablet 1   metFORMIN (GLUCOPHAGE) 500 MG tablet TAKE ONE TABLET BY MOUTH TWICE A DAY WITH MEALS 180 tablet 1   metoprolol tartrate (LOPRESSOR) 50 MG tablet Take 1 tablet (50 mg total) by mouth 2 (two) times daily. 60 tablet 6   nitroGLYCERIN (NITROSTAT) 0.4 MG SL tablet Place 1 tablet (0.4 mg total) under the tongue every 5 (five) minutes as needed for chest pain. For chest pain 25 tablet 3   OLANZapine (ZYPREXA) 15 MG tablet Take 15 mg by mouth at bedtime.     rosuvastatin (CRESTOR) 20 MG tablet TAKE ONE TABLET BY MOUTH DAILY 90 tablet 2   sacubitril-valsartan (ENTRESTO) 24-26 MG Take 1 tablet by mouth 2 (two) times daily. 60 tablet 11   XARELTO 20 MG TABS tablet TAKE ONE TABLET BY MOUTH AT BEDTIME 85 tablet 3   No current facility-administered medications on file prior to visit.    Allergies  Allergen Reactions   Tetanus-Diphtheria Toxoids Td Itching    Other reaction(s): Myalgias (intolerance) Vigorous Local Soft Tissue Reaction to TDAP   Eggs Or Egg-Derived Products Hives and Other (See Comments)    Can eat foods with cooked eggs; CANNOT handle when part of flu vaccine, etc.    Tetanus Toxoids     Vigorous local reaction to tdap with local pain and itching    Lamictal [Lamotrigine] Hives and Rash     Assessment/Plan:  1. Weight loss/DM - Patient A1c 6.1 which is at goal of <7.0%.  However despite BG control, weight remains elevated and patient is motivated to lose weight.  Is exercising >30 minutes daily.    Advised that her insurance plan will likely cover either  Ozempic or Mounjaro. Using demo pens, educated patient on mechanism of action, storage, site selection, and administration.  Discussed possible adverse effects in detail and encouraged patient to eat smaller portions and avoid high fat meals.  Patient prefers to try [facility] due to ease of use of pen.  Will complete PA and contact patient when approved.  If denied, will submit request for Ozempic.    Confirmed patient has no personal or family history of medullary thyroid carcinoma (MTC) or Multiple Endocrine Neoplasia syndrome type 2 (MEN 2).   Karren Cobble, PharmD, [place], [place], [place], [place], [place], [address] Phone: [phone], Fax: [phone]

## 2021-07-13 ENCOUNTER — Other Ambulatory Visit: Payer: Self-pay | Admitting: Family Medicine

## 2021-07-14 ENCOUNTER — Telehealth: Payer: Self-pay | Admitting: *Deleted

## 2021-07-14 NOTE — Telephone Encounter (Signed)
Due to the patient's insurance coverage the APAP order will be sent to [facility].

## 2021-07-14 NOTE — Telephone Encounter (Signed)
Patient notified per Dr Radford Pax that due to her nocturnal hypoxia CPAP is treatment of choice. Patient voiced understanding and agrees to proceed with CPAP treatment.  CPAP order will be sent to [facility].

## 2021-08-03 ENCOUNTER — Telehealth: Payer: Self-pay | Admitting: Pharmacist

## 2021-08-03 ENCOUNTER — Telehealth: Payer: Self-pay | Admitting: Cardiology

## 2021-08-03 DIAGNOSIS — E119 Type 2 diabetes mellitus without complications: Secondary | ICD-10-CM

## 2021-08-03 MED ORDER — MOUNJARO 5 MG/0.5ML ~~LOC~~ SOAJ: 5.0000 mg | SUBCUTANEOUS | 1 refills | Status: DC

## 2021-08-03 NOTE — Telephone Encounter (Signed)
Patient is requesting to discuss CPAP treatment.

## 2021-08-03 NOTE — Telephone Encounter (Signed)
Patient called to give update.  Has lost 9# since starting mounjaro.  Has 1 injection left. Will send next dose to pharmacy.

## 2021-08-08 ENCOUNTER — Other Ambulatory Visit: Payer: Self-pay | Admitting: Internal Medicine

## 2021-08-08 DIAGNOSIS — E119 Type 2 diabetes mellitus without complications: Secondary | ICD-10-CM

## 2021-08-10 ENCOUNTER — Other Ambulatory Visit: Payer: Self-pay | Admitting: Family Medicine

## 2021-08-16 ENCOUNTER — Encounter: Payer: Self-pay | Admitting: Cardiology

## 2021-08-16 ENCOUNTER — Encounter: Payer: Self-pay | Admitting: *Deleted

## 2021-08-16 ENCOUNTER — Ambulatory Visit: Payer: Medicare PPO | Admitting: Cardiology

## 2021-08-16 VITALS — BP 104/56 | HR 78 | Ht 70.0 in | Wt 285.0 lb

## 2021-08-16 DIAGNOSIS — Z01812 Encounter for preprocedural laboratory examination: Secondary | ICD-10-CM

## 2021-08-16 DIAGNOSIS — D6869 Other thrombophilia: Secondary | ICD-10-CM

## 2021-08-16 DIAGNOSIS — I480 Paroxysmal atrial fibrillation: Secondary | ICD-10-CM | POA: Diagnosis not present

## 2021-08-16 DIAGNOSIS — I4819 Other persistent atrial fibrillation: Secondary | ICD-10-CM | POA: Diagnosis not present

## 2021-08-16 NOTE — Progress Notes (Signed)
Electrophysiology Office Note   Date:  08/16/2021   ID:  Kathleen [name], DOB 07/26/1955, MRN [id number]  PCP:  Darreld Mclean, MD  Cardiologist: [facility] Primary Electrophysiologist:  Keelan Pomerleau Meredith Leeds, MD    Chief Complaint: Atrial fibrillation   History of Present Illness: Kathleen [name] is a 66 y.o. female who is being seen today for the evaluation of atrial fibrillation at the request of Fenton, Clint R, PA. Presenting today for electrophysiology evaluation.  She has a history significant for chronic diastolic heart failure, diabetes, hypertension, hyperlipidemia, coronary artery disease, obstructive sleep apnea, atrial fibrillation.  She was admitted in 2016 for rapid atrial fibrillation.  She was again admitted for atrial fibrillation in 2021.  She had a cardiac monitor placed May 2023 that showed a 15% atrial fibrillation burden.  She has intermittent fatigue and weakness.  She would prefer to remain in normal rhythm.  She also has morbid obesity, but has been going to the gym and working out.  She has lost a significant amount of weight.  Today, she denies symptoms of palpitations, chest pain, shortness of breath, orthopnea, PND, lower extremity edema, claudication, dizziness, presyncope, syncope, bleeding, or neurologic sequela. The patient is tolerating medications without difficulties.    Past Medical History:  Diagnosis Date   Anemia    Anxiety    Asthma    Atrial fibrillation ([place])    Bipolar disorder ([place])    CAD (coronary artery disease)    Cancer (HCC)    basal cell carcinoma on right hand, papilloma left breast   CHF (congestive heart failure) ([place])    pt seen at heart/vascular spec clinic 08/14/2014    Chronic kidney disease    Coarse tremors    hands   Coronary artery disease    Depression    DVT (deep vein thrombosis) in pregnancy    pt. reports that it was post knee surgery, not during pregnancy   Dyslipidemia    Fall    Fatty liver     Headache    migraines - stopped after menopause   Heart attack ([place]) 02/2007   non-q-wave with second septal perforator   Hyperlipidemia    Hypertension    Hypothyroidism    history of took medication, has resolved   Knee pain, left    Lower extremity deep venous thrombosis (HCC)    20 years ago    Measles    hx of in childhood    Morbid obesity with BMI of 40.0-44.9, adult ([place])    Pain at surgical incision    Left breast from procedure 09/11/16   Pneumonia    hx of    PONV (postoperative nausea and vomiting)    also slow to wake up   Psychiatric hospitalization 05/2008   Shingles    20 years ago    Shortness of breath dyspnea    exercise    Type 2 diabetes mellitus ([place]) 12/31/2015   Urinary incontinence    UTI (urinary tract infection)    Past Surgical History:  Procedure Laterality Date   BASAL CELL CARCINOMA EXCISION     BREAST EXCISIONAL BIOPSY Left 2018   BREAST LUMPECTOMY WITH RADIOACTIVE SEED LOCALIZATION Left 09/11/2016   Procedure: LEFT BREAST LUMPECTOMY WITH RADIOACTIVE SEED LOCALIZATION;  Surgeon: Excell Seltzer, MD;  Location: [facility];  Service: General;  Laterality: Left;   BREAST MASS EXCISION     age 10, benign tumor   [place]  N/A 12/30/2015   Procedure: Left Heart Cath and Coronary Angiography;  Surgeon: Belva Crome, MD;  Location: [facility];  Service: Cardiovascular;  Laterality: N/A;   COLONOSCOPY  07/15/2009   [facility]   DILATATION & CURETTAGE/HYSTEROSCOPY WITH MYOSURE N/A 09/22/2016   Procedure: DILATATION & CURETTAGE/HYSTEROSCOPY WITH MYOSURE;  Surgeon: Molli Posey, MD;  Location: [facility];  Service: Gynecology;  Laterality: N/A;   DILATION AND CURETTAGE OF UTERUS     KNEE SURGERY     right, removed cartilage   PARATHYROIDECTOMY N/A 08/25/2014   Procedure: PARATHYROIDECTOMY;  Surgeon: Armandina Gemma, MD;  Location: [facility];  Service: General;  Laterality: N/A;   SHOULDER ARTHROSCOPY WITH ROTATOR  CUFF REPAIR AND SUBACROMIAL DECOMPRESSION Right 02/27/2017   Procedure: Right shoulder arthroscopic rotator cuff repair with biceps tenodesis and subacromial decompression;  Surgeon: Nicholes Stairs, MD;  Location: [facility];  Service: Orthopedics;  Laterality: Right;  150 mins   TONSILLECTOMY     TUBAL LIGATION       Current Outpatient Medications  Medication Sig Dispense Refill   Accu-Chek Softclix Lancets lancets Use as instructed E11.9 100 each 12   allopurinol (ZYLOPRIM) 100 MG tablet TAKE TWO TABLETS BY MOUTH DAILY 180 tablet 1   benztropine (COGENTIN) 1 MG tablet Take 1 mg by mouth 2 (two) times daily.  2   Biotin w/ Vitamins C & E (HAIR/SKIN/NAILS PO) Take 1 tablet by mouth daily.     Blood Glucose Calibration (ACCU-CHEK GUIDE CONTROL) LIQD 1 drop by In Vitro route as directed. E11.9 1 each 1   Blood Glucose Monitoring Suppl (ACCU-CHEK GUIDE ME) w/Device KIT 1 each by Does not apply route in the morning and at bedtime. E11.9 1 kit 0   calcitRIOL (ROCALTROL) 0.25 MCG capsule TAKE ONE CAPSULE BY MOUTH DAILY 90 capsule 1   Cholecalciferol (VITAMIN D3) 50 MCG (2000 UT) capsule Take 2,000 Units by mouth daily.     divalproex (DEPAKOTE ER) 500 MG 24 hr tablet Take 1,000 mg by mouth at bedtime.     empagliflozin (JARDIANCE) 10 MG TABS tablet Take 1 tablet (10 mg total) by mouth daily. 90 tablet 3   furosemide (LASIX) 80 MG tablet Take 0.5 tablets (40 mg total) by mouth 2 (two) times daily. Can take an additional 45m as needed for weight gain of 3 pounds for more in 24 hours 135 tablet 3   glucose blood (ACCU-CHEK GUIDE) test strip Use as instructed 100 each 12   LATUDA 60 MG TABS Take 60 mg by mouth at bedtime.      levalbuterol (XOPENEX HFA) 45 MCG/ACT inhaler Inhale 2 puffs into the lungs every 4 (four) hours as needed for wheezing. 1 Inhaler 1   levothyroxine (SYNTHROID) 50 MCG tablet TAKE ONE TABLET BY MOUTH DAILY BEFORE BREAKFAST 90 tablet 1   metFORMIN (GLUCOPHAGE) 500 MG tablet  TAKE ONE TABLET BY MOUTH TWICE A DAY WITH MEALS 180 tablet 1   metoprolol tartrate (LOPRESSOR) 50 MG tablet Take 1 tablet (50 mg total) by mouth 2 (two) times daily. 60 tablet 6   nitroGLYCERIN (NITROSTAT) 0.4 MG SL tablet Place 1 tablet (0.4 mg total) under the tongue every 5 (five) minutes as needed for chest pain. For chest pain 25 tablet 3   OLANZapine (ZYPREXA) 15 MG tablet Take 15 mg by mouth at bedtime.     rosuvastatin (CRESTOR) 20 MG tablet TAKE ONE TABLET BY MOUTH DAILY 90 tablet 2   sacubitril-valsartan (ENTRESTO) 24-26 MG Take 1  tablet by mouth 2 (two) times daily. 60 tablet 11   tirzepatide (MOUNJARO) 5 MG/0.5ML Pen Inject 5 mg into the skin once a week. 2 mL 1   XARELTO 20 MG TABS tablet TAKE ONE TABLET BY MOUTH AT BEDTIME 85 tablet 3   No current facility-administered medications for this visit.    Allergies:   Tetanus-diphtheria toxoids td, Eggs or egg-derived products, Tetanus toxoids, and Lamictal [lamotrigine]   Social History:  The patient  reports that she has never smoked. She has never used smokeless tobacco. She reports current alcohol use. She reports that she does not use drugs.   Family History:  The patient's family history includes Alcohol abuse in her father, maternal grandfather, maternal grandmother, mother, paternal grandfather, and paternal grandmother; Alzheimer's disease in her father and mother; Breast cancer (age of onset: 63) in her mother; Diabetes in her brother; Drug abuse in her maternal grandfather; Hypertension in her brother; Lung cancer in her mother; Parkinson's disease in her father; Schizophrenia in her cousin.    ROS:  Please see the history of present illness.   Otherwise, review of systems is positive for none.   All other systems are reviewed and negative.    PHYSICAL EXAM: VS:  BP (!) 104/56   Pulse 78   Ht _0  (1.778 m)   Wt 285 lb (129.3 kg)   SpO2 98%   BMI 40.89 kg/m  , BMI Body mass index is 40.89 kg/m. GEN: Well nourished,  well developed, in no acute distress  HEENT: normal  Neck: no JVD, carotid bruits, or masses Cardiac: RRR; no murmurs, rubs, or gallops,no edema  Respiratory:  clear to auscultation bilaterally, normal work of breathing GI: soft, nontender, nondistended, + BS MS: no deformity or atrophy  Skin: warm and dry Neuro:  Strength and sensation are intact Psych: euthymic mood, full affect  EKG:  EKG is ordered today. Personal review of the ekg ordered shows sinus rhythm   Recent Labs: 02/15/2021: B Natriuretic Peptide 75.7 05/26/2021: ALT 18; BUN 28; Creatinine, Ser 1.26; Hemoglobin 15.5; Platelets 134.0; Potassium 4.5; Sodium 142; TSH 1.92    Lipid Panel     Component Value Date/Time   CHOL 138 09/29/2020 1042   TRIG 124.0 09/29/2020 1042   HDL 44.60 09/29/2020 1042   CHOLHDL 3 09/29/2020 1042   VLDL 24.8 09/29/2020 1042   LDLCALC 69 09/29/2020 1042   LDLDIRECT 166.0 08/03/2016 0834     Wt Readings from Last 3 Encounters:  08/16/21 285 lb (129.3 kg)  07/12/21 292 lb (132.5 kg)  06/30/21 289 lb 9.6 oz (131.4 kg)      Other studies Reviewed: Additional studies/ records that were reviewed today include: TTE 02/15/21  Review of the above records today demonstrates:   1. Left ventricular ejection fraction, by estimation, is 55 to 60%. The  left ventricle has normal function. The left ventricle has no regional  wall motion abnormalities. Left ventricular diastolic parameters are  consistent with Grade I diastolic  dysfunction (impaired relaxation). The average left ventricular global  longitudinal strain is -21.6 %. The global longitudinal strain is normal.   2. Right ventricular systolic function is mildly reduced. The right  ventricular size is normal. There is normal pulmonary artery systolic  pressure.   3. The mitral valve is normal in structure. No evidence of mitral valve  regurgitation. No evidence of mitral stenosis.   4. Tricuspid valve regurgitation is mild to  moderate.   5. The aortic valve is  normal in structure. Aortic valve regurgitation is  mild. No aortic stenosis is present.   6. The inferior vena cava is normal in size with greater than 50%  respiratory variability, suggesting right atrial pressure of 3 mmHg.   Cardiac monitor 06/21/2021 personally reviewed 1. Sinus rhythm -  avg HR of 81 bpm. 2. Three runs of Supraventricular Tachycardia occurred, the run with the fastest interval lasting 22.4 secs with a max rate of 148 bpm (avg 111bpm); the run with the fastest interval was also the longest.  3. Atrial Fibrillation/Flutter occurred (15% burden), ranging from 72-201 bpm (avg of 128  bpm), the longest lasting 12 hours 1 min with an avg rate of 117 bpm.  4. Rare PACs and PVCs 5. Multiple patient-triggered events associated with sinus rhythm, isolated PACs, isolated PVCs and/or AF.   ASSESSMENT AND PLAN:  1.  Paroxysmal atrial fibrillation: CHA2DS2-VASc of 6.  Currently on Xarelto 20 mg daily.  She wore a cardiac monitor that showed a 15% atrial fibrillation burden.  She is symptomatic with weakness and fatigue.  She would prefer to remain in normal rhythm.  She would prefer to avoid antiarrhythmic medications.  She is on the borderline of a wait to have this done, though with her symptoms, we Kathleen [name] plan for ablation.  Risk, benefits, and alternatives to EP study and radiofrequency ablation for afib were also discussed in detail today. These risks include but are not limited to stroke, bleeding, vascular damage, tamponade, perforation, damage to the esophagus, lungs, and other structures, pulmonary vein stenosis, worsening renal function, and death. The patient understands these risk and wishes to proceed.  We Kathleen [name] therefore proceed with catheter ablation at the next available time.  Carto, ICE, anesthesia are requested for the procedure.  Kathleen [name] also obtain CT PV protocol prior to the procedure to exclude LAA thrombus and further evaluate atrial  anatomy.   2.  Secondary hypercoagulable state: Currently on Xarelto for atrial fibrillation as above  3.  Obstructive sleep apnea: Managed by sleep medicine.  4.  Obesity: Lifestyle modification encouraged.  Patient exercise on a regular basis. Body mass index is 40.89 kg/m.  5.  Chronic diastolic heart failure: Appears euvolemic.  Plan per heart failure clinic.  6.  Coronary artery disease: No current angina  7.  Hypertension: Currently well controlled  Discussed with primary cardiology  Current medicines are reviewed at length with the patient today.   The patient does not have concerns regarding her medicines.  The following changes were made today:  none  Labs/ tests ordered today include:  Orders Placed This Encounter  Procedures   CT CARDIAC MORPH/PULM VEIN W/CM&W/O CA SCORE   Basic metabolic panel   CBC   EKG 12-Lead     Disposition:   FU with Kathleen [name] 3 months  Signed, Kathleen [name] Meredith Leeds, MD  08/16/2021 12:56 PM     [facility] [address] [place] [address] [phone] (office) [phone] (fax)

## 2021-08-16 NOTE — Patient Instructions (Signed)
Medication Instructions:  Your physician recommends that you continue on your current medications as directed. Please refer to the Current Medication list given to you today.  *If you need a refill on your cardiac medications before your next appointment, please call your pharmacy*   Lab Work: Pre procedure labs - see procedure instruction letter:  BMP & CBC  If you have labs (blood work) drawn today and your tests are completely normal, you will receive your results only by: MyChart Message (if you have MyChart) OR A paper copy in the mail If you have any lab test that is abnormal or we need to change your treatment, we will call you to review the results.   Testing/Procedures: Your physician has requested that you have cardiac CT within 7 days PRIOR to your ablation. Cardiac computed tomography (CT) is a painless test that uses an x-ray machine to take clear, detailed pictures of your heart.  Please follow instruction below located under "other instructions". You will get a call from our office to schedule the date for this test.  Your physician has recommended that you have an ablation. Catheter ablation is a medical procedure used to treat some cardiac arrhythmias (irregular heartbeats). During catheter ablation, a long, thin, flexible tube is put into a blood vessel in your groin (upper thigh), or neck. This tube is called an ablation catheter. It is then guided to your heart through the blood vessel. Radio frequency waves destroy small areas of heart tissue where abnormal heartbeats may cause an arrhythmia to start. Please follow instruction letter given to you today.   Follow-Up: At [facility], you and your health needs are our priority.  As part of our continuing mission to provide you with exceptional heart care, we have created designated Provider Care Teams.  These Care Teams include your primary Cardiologist (physician) and Advanced Practice Providers (APPs -  Physician  Assistants and Nurse Practitioners) who all work together to provide you with the care you need, when you need it.  Your next appointment:   1 month(s) after your ablation  The format for your next appointment:   In Person  Provider:   AFib clinic   Thank you for choosing [facility]!!   Shaton Lore, RN [phone]    Other Instructions  Cardiac Ablation Cardiac ablation is a procedure to destroy (ablate) some heart tissue that is sending bad signals. These bad signals cause problems in heart rhythm. The heart has many areas that make these signals. If there are problems in these areas, they can make the heart beat in a way that is not normal. Destroying some tissues can help make the heart rhythm normal. Tell your doctor about: Any allergies you have. All medicines you are taking. These include vitamins, herbs, eye drops, creams, and over-the-counter medicines. Any problems you or family members have had with medicines that make you fall asleep (anesthetics). Any blood disorders you have. Any surgeries you have had. Any medical conditions you have, such as kidney failure. Whether you are pregnant or may be pregnant. What are the risks? This is a safe procedure. But problems may occur, including: Infection. Bruising and bleeding. Bleeding into the chest. Stroke or blood clots. Damage to nearby areas of your body. Allergies to medicines or dyes. The need for a pacemaker if the normal system is damaged. Failure of the procedure to treat the problem. What happens before the procedure? Medicines Ask your doctor about: Changing or stopping your normal medicines. This is   important. Taking aspirin and ibuprofen. Do not take these medicines unless your doctor tells you to take them. Taking other medicines, vitamins, herbs, and supplements. General instructions Follow instructions from your doctor about what you cannot eat or drink. Plan to have someone take you home  from the hospital or clinic. If you will be going home right after the procedure, plan to have someone with you for 24 hours. Ask your doctor what steps will be taken to prevent infection. What happens during the procedure?  An IV tube will be put into one of your veins. You will be given a medicine to help you relax. The skin on your neck or groin will be numbed. A cut (incision) will be made in your neck or groin. A needle will be put through your cut and into a large vein. A tube (catheter) will be put into the needle. The tube will be moved to your heart. Dye may be put through the tube. This helps your doctor see your heart. Small devices (electrodes) on the tube will send out signals. A type of energy will be used to destroy some heart tissue. The tube will be taken out. Pressure will be held on your cut. This helps stop bleeding. A bandage will be put over your cut. The exact procedure may vary among doctors and hospitals. What happens after the procedure? You will be watched until you leave the hospital or clinic. This includes checking your heart rate, breathing rate, oxygen, and blood pressure. Your cut will be watched for bleeding. You will need to lie still for a few hours. Do not drive for 24 hours or as long as your doctor tells you. Summary Cardiac ablation is a procedure to destroy some heart tissue. This is done to treat heart rhythm problems. Tell your doctor about any medical conditions you may have. Tell him or her about all medicines you are taking to treat them. This is a safe procedure. But problems may occur. These include infection, bruising, bleeding, and damage to nearby areas of your body. Follow what your doctor tells you about food and drink. You may also be told to change or stop some of your medicines. After the procedure, do not drive for 24 hours or as long as your doctor tells you. This information is not intended to replace advice given to you by your  health care provider. Make sure you discuss any questions you have with your health care provider. Document Revised: 12/12/2018 Document Reviewed: 12/12/2018 Elsevier Patient Education  2023 [employer]  

## 2021-08-24 DIAGNOSIS — F3112 Bipolar disorder, current episode manic without psychotic features, moderate: Secondary | ICD-10-CM | POA: Diagnosis not present

## 2021-08-24 DIAGNOSIS — F411 Generalized anxiety disorder: Secondary | ICD-10-CM | POA: Diagnosis not present

## 2021-08-25 ENCOUNTER — Telehealth: Payer: Self-pay | Admitting: Hematology and Oncology

## 2021-08-25 NOTE — Telephone Encounter (Signed)
Per 8/3 phone line pt called to cancel appointment   appointment canceled per pt request

## 2021-08-29 ENCOUNTER — Ambulatory Visit: Payer: Medicare PPO | Admitting: Hematology and Oncology

## 2021-09-25 NOTE — Patient Instructions (Incomplete)
Good to see you today- I will be in touch with your labs asap Please see me in about 6 months and take care  Let's increase Mounjaro to 7.5 mg Decrease metformin to once a day Let me know if you keep getting these apparent bites or any other rashes!

## 2021-09-25 NOTE — Progress Notes (Unsigned)
[place] at [facility] [address] [place], [place] [address] [phone]  Date:  09/28/2021   Name:  Kathleen [name]   DOB:  Oct 09, 1955   MRN:  [id number]  PCP:  Darreld Mclean, MD    Chief Complaint: No chief complaint on file.   History of Present Illness:  Kathleen [name] is a 66 y.o. very pleasant female patient who presents with the following:  Seen today for periodic recheck Last seen by myself in May - h/o morbid obesity, chronic diastolic HF, DM2, hypothyroidism, hyperparathyroidism status post parathyroidectomy, HTN, HL and CAD with NSTEMI 2009. Sleep study 11/2013 was negative. Also has history of A fib, bipolar disorder managed by Dr Ronnald Ramp   Specialists include Nephrology Dr Posey Pronto Dermatology Dr [facility]  Cardiology Dr [name] Psychiatry Dr Ronnald Ramp GYN Dr Matthew Saras  Lab Results  Component Value Date   HGBA1C 6.1 03/30/2021    Seen  by Dr [name] in June:  1. Syncope - No further events since March episode. - She has Neurology follow up - Zio & Carotid u/s ok - Recent labs ok. - ? Hypoglycemic vs vagal event. - No driving or 6 months from event 2. Chronic diastolic HF:  - Echo 05/5730 EF Normal EF 55-60% Grade IDD  - Echo 7/19 EF 55-60%  - Echo 2/21 EF 60-65%, RV ok  - Echo 1/23 EF 50-65%, RV ok. - Stable NYHA II Volume looks good today. Doing well with sliding scale lasix - Continue Entresto 49/51 mg bid. - Continue Lasix 80 mg bid.  - Continue Jardiance 10 mg daily.  - Continue metoprolol 25 mg bid. - No BP room to add spiro today. - Recent labs ok, SCr 1.26, K 4.5 3. PAF - Zio patch 3/22 - 3% AF burden - Continue metoprolol - CHADSVASC = 5 - Continue Xarelto. No bleeding. Recent CBC stable. - In 5/23 had recurrent AF. DC-CV arranged but was in NSR on arrival  - Repeat Zio 5/23 - AF burden 15% (average rate 128 in AF. 81 when in sinus). 3 runs SVT - AF burden increased. -> Increase  metoprolol to 50 bid. Repeat sleep study. REfer to AF Clinic to discuss options 4. CAD:  - No s/s angina - No ASA w/ Xarelto.  - Continue statin.  5. HTN:   - BP stable. - Continue current meds. 6. DM2 - Continue Jardiance 10 mg daily. - Continue metformin.  - HgbA1c 6.1 (3/23) - Refer for GLP1RA to help with weight loss 7. CKD 3b - Follows with Dr. Posey Pronto. - Continue SGLT2i. - Recent labs ok. 8. Morbid obesity: - Body mass index is 40.64 kg/m. - Continue with exercises at [facility]. - Refer for GLP1RA 9. Snoring:  - No OSA on previous sleep study in 2015 - With progressive AF will need repeat .    She was seen by Dr Curt Bears in July- they plan to do an ablation for her A fib in November   She was started on [facility] for DM and weight loss also over the summer  Patient Active Problem List   Diagnosis Date Noted   Diabetes mellitus without complication ([place]) 20/25/4270   Secondary hypercoagulable state ([place]) 06/30/2021   At increased risk of breast cancer 03/01/2021   Osteopenia 12/02/2019   AKI (acute kidney injury) ([place])    CAP (community acquired pneumonia) 03/01/2019   Atrial fibrillation with RVR ([place]) 02/28/2019   Chronic kidney disease (  CKD), stage III (moderate) ([place]) 01/14/2019   Vitamin D deficiency 07/22/2018   Complete rotator cuff tear or rupture of right shoulder, not specified as traumatic 02/27/2017   Auditory hallucination 12/13/2016   Gout 06/29/2016   Type 2 diabetes mellitus ([place]) 12/31/2015   Coronary artery dissection    Lung nodule seen on imaging study 09/24/2014   Paroxysmal atrial fibrillation ([place]) 09/11/2014   Asthmatic bronchitis with exacerbation 05/25/2014   Primary hyperparathyroidism ([place]) 04/15/2014   Chronic diastolic heart failure ([place]) 10/02/2013   Shortness of breath 08/31/2013   Venous stasis dermatitis of both lower extremities 08/19/2013   Nocturia 06/12/2013   Morbid obesity ([place]) 08/09/2012   Urinary incontinence, urge  07/11/2012   CAD (coronary artery disease) 10/31/2010   NEOPLASM OF UNCERTAIN BEHAVIOR OF SKIN 02/11/2010   Bipolar disorder ([place]) 09/14/2009   ACUT MYOCARD INFARCT UNS SITE SUBSQT EPIS CARE 07/02/2008   Hyperlipidemia 05/23/2008   UNSPECIFIED DISORDER OF THYROID 02/04/2008   HYPERTENSION, BENIGN ESSENTIAL 02/04/2008   MYOCARDIAL INFARCTION, HX OF 03/05/2007    Past Medical History:  Diagnosis Date   Anemia    Anxiety    Asthma    Atrial fibrillation ([place])    Bipolar disorder ([place])    CAD (coronary artery disease)    Cancer ([place])    basal cell carcinoma on right hand, papilloma left breast   CHF (congestive heart failure) ([place])    pt seen at heart/vascular spec clinic 08/14/2014    Chronic kidney disease    Coarse tremors    hands   Coronary artery disease    Depression    DVT (deep vein thrombosis) in pregnancy    pt. 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[place]) 02/2007   non-q-wave with second septal perforator   Hyperlipidemia    Hypertension    Hypothyroidism    history of took medication, has resolved   Knee pain, left    Lower extremity deep venous thrombosis (HCC)    20 years ago    Measles    hx of in childhood    Morbid obesity with BMI of 40.0-44.9, adult ([place])    Pain at surgical incision    Left breast from procedure 09/11/16   Pneumonia    hx of    PONV (postoperative nausea and vomiting)    also slow to wake up   Psychiatric hospitalization 05/2008   Shingles    20 years ago    Shortness of breath dyspnea    exercise    Type 2 diabetes mellitus ([place]) 12/31/2015   Urinary incontinence    UTI (urinary tract infection)     Past Surgical History:  Procedure Laterality Date   BASAL CELL CARCINOMA EXCISION     BREAST EXCISIONAL BIOPSY Left 2018   BREAST LUMPECTOMY WITH RADIOACTIVE SEED LOCALIZATION Left 09/11/2016    Procedure: LEFT BREAST LUMPECTOMY WITH RADIOACTIVE SEED LOCALIZATION;  Surgeon: Excell Seltzer, MD;  Location: [facility];  Service: General;  Laterality: Left;   BREAST MASS EXCISION     age 67, benign tumor   CARDIAC CATHETERIZATION     CARDIAC CATHETERIZATION N/A 12/30/2015   Procedure: Left Heart Cath and Coronary Angiography;  Surgeon: Belva Crome, MD;  Location: [facility];  Service: Cardiovascular;  Laterality: N/A;   COLONOSCOPY  07/15/2009  [facility]   DILATATION & CURETTAGE/HYSTEROSCOPY WITH MYOSURE N/A 09/22/2016   Procedure: DILATATION & CURETTAGE/HYSTEROSCOPY WITH MYOSURE;  Surgeon: Molli Posey, MD;  Location: [facility];  Service: Gynecology;  Laterality: N/A;   DILATION AND CURETTAGE OF UTERUS     KNEE SURGERY     right, removed cartilage   PARATHYROIDECTOMY N/A 08/25/2014   Procedure: PARATHYROIDECTOMY;  Surgeon: Armandina Gemma, MD;  Location: [facility];  Service: General;  Laterality: N/A;   SHOULDER ARTHROSCOPY WITH ROTATOR CUFF REPAIR AND SUBACROMIAL DECOMPRESSION Right 02/27/2017   Procedure: Right shoulder arthroscopic rotator cuff repair with biceps tenodesis and subacromial decompression;  Surgeon: Nicholes Stairs, MD;  Location: [facility];  Service: Orthopedics;  Laterality: Right;  150 mins   TONSILLECTOMY     TUBAL LIGATION      Social History   Tobacco Use   Smoking status: Never   Smokeless tobacco: Never  Vaping Use   Vaping Use: Never used  Substance Use Topics   Alcohol use: Yes    Alcohol/week: 0.0 standard drinks of alcohol    Comment: occ.   Drug use: No    Family History  Problem Relation Age of Onset   Breast cancer Mother 78   Alcohol abuse Mother    Alzheimer's disease Mother    Lung cancer Mother    Alcohol abuse Father    Alzheimer's disease Father    Parkinson's disease Father    Alcohol abuse Maternal Grandfather    Drug abuse Maternal Grandfather    Alcohol abuse Maternal Grandmother    Alcohol abuse Paternal Grandfather     Alcohol abuse Paternal Grandmother    Schizophrenia Cousin    Diabetes Brother    Hypertension Brother    Colon cancer Neg Hx    Esophageal cancer Neg Hx    Rectal cancer Neg Hx    Stomach cancer Neg Hx     Allergies  Allergen Reactions   Tetanus-Diphtheria Toxoids Td Itching    Other reaction(s): Myalgias (intolerance) Vigorous Local Soft Tissue Reaction to TDAP   Eggs Or Egg-Derived Products Hives and Other (See Comments)    Can eat foods with cooked eggs; CANNOT handle when part of flu vaccine, etc.    Tetanus Toxoids     Vigorous local reaction to tdap with local pain and itching    Lamictal [Lamotrigine] Hives and Rash    Medication list has been reviewed and updated.  Current Outpatient Medications on File Prior to Visit  Medication Sig Dispense Refill   Accu-Chek Softclix Lancets lancets Use as instructed E11.9 100 each 12   allopurinol (ZYLOPRIM) 100 MG tablet TAKE TWO TABLETS BY MOUTH DAILY 180 tablet 1   benztropine (COGENTIN) 1 MG tablet Take 1 mg by mouth 2 (two) times daily.  2   Biotin w/ Vitamins C & E (HAIR/SKIN/NAILS PO) Take 1 tablet by mouth daily.     Blood Glucose Calibration (ACCU-CHEK GUIDE CONTROL) LIQD 1 drop by In Vitro route as directed. E11.9 1 each 1   Blood Glucose Monitoring Suppl (ACCU-CHEK GUIDE ME) w/Device KIT 1 each by Does not apply route in the morning and at bedtime. E11.9 1 kit 0   calcitRIOL (ROCALTROL) 0.25 MCG capsule TAKE ONE CAPSULE BY MOUTH DAILY 90 capsule 1   Cholecalciferol (VITAMIN D3) 50 MCG (2000 UT) capsule Take 2,000 Units by mouth daily.     divalproex (DEPAKOTE ER) 500 MG 24 hr tablet Take 1,000 mg by mouth at bedtime.     empagliflozin (JARDIANCE)  10 MG TABS tablet Take 1 tablet (10 mg total) by mouth daily. 90 tablet 3   furosemide (LASIX) 80 MG tablet Take 0.5 tablets (40 mg total) by mouth 2 (two) times daily. Can take an additional 65m as needed for weight gain of 3 pounds for more in 24 hours 135 tablet 3   glucose  blood (ACCU-CHEK GUIDE) test strip Use as instructed 100 each 12   LATUDA 60 MG TABS Take 60 mg by mouth at bedtime.      levalbuterol (XOPENEX HFA) 45 MCG/ACT inhaler Inhale 2 puffs into the lungs every 4 (four) hours as needed for wheezing. 1 Inhaler 1   levothyroxine (SYNTHROID) 50 MCG tablet TAKE ONE TABLET BY MOUTH DAILY BEFORE BREAKFAST 90 tablet 1   metFORMIN (GLUCOPHAGE) 500 MG tablet TAKE ONE TABLET BY MOUTH TWICE A DAY WITH MEALS 180 tablet 1   metoprolol tartrate (LOPRESSOR) 50 MG tablet Take 1 tablet (50 mg total) by mouth 2 (two) times daily. 60 tablet 6   nitroGLYCERIN (NITROSTAT) 0.4 MG SL tablet Place 1 tablet (0.4 mg total) under the tongue every 5 (five) minutes as needed for chest pain. For chest pain 25 tablet 3   OLANZapine (ZYPREXA) 15 MG tablet Take 15 mg by mouth at bedtime.     rosuvastatin (CRESTOR) 20 MG tablet TAKE ONE TABLET BY MOUTH DAILY 90 tablet 2   sacubitril-valsartan (ENTRESTO) 24-26 MG Take 1 tablet by mouth 2 (two) times daily. 60 tablet 11   tirzepatide (MOUNJARO) 5 MG/0.5ML Pen Inject 5 mg into the skin once a week. 2 mL 1   XARELTO 20 MG TABS tablet TAKE ONE TABLET BY MOUTH AT BEDTIME 85 tablet 3   No current facility-administered medications on file prior to visit.    Review of Systems:  As per HPI- otherwise negative.   Physical Examination: There were no vitals filed for this visit. There were no vitals filed for this visit. There is no height or weight on file to calculate BMI. Ideal Body Weight:    GEN: no acute distress. HEENT: Atraumatic, Normocephalic.  Ears and Nose: No external deformity. CV: RRR, No M/G/R. No JVD. No thrill. No extra heart sounds. PULM: CTA B, no wheezes, crackles, rhonchi. No retractions. No resp. distress. No accessory muscle use. ABD: S, NT, ND, +BS. No rebound. No HSM. EXTR: No c/c/e PSYCH: Normally interactive. Conversant.    Assessment and Plan: ***  Signed JLamar Blinks MD

## 2021-09-27 ENCOUNTER — Other Ambulatory Visit: Payer: Self-pay | Admitting: Internal Medicine

## 2021-09-27 DIAGNOSIS — E119 Type 2 diabetes mellitus without complications: Secondary | ICD-10-CM

## 2021-09-28 ENCOUNTER — Encounter: Payer: Self-pay | Admitting: Family Medicine

## 2021-09-28 ENCOUNTER — Ambulatory Visit: Payer: Medicare PPO | Admitting: Family Medicine

## 2021-09-28 VITALS — BP 122/68 | HR 86 | Temp 97.9°F | Resp 18 | Ht 71.0 in | Wt 278.2 lb

## 2021-09-28 DIAGNOSIS — I10 Essential (primary) hypertension: Secondary | ICD-10-CM

## 2021-09-28 DIAGNOSIS — E119 Type 2 diabetes mellitus without complications: Secondary | ICD-10-CM | POA: Diagnosis not present

## 2021-09-28 DIAGNOSIS — E039 Hypothyroidism, unspecified: Secondary | ICD-10-CM

## 2021-09-28 DIAGNOSIS — I480 Paroxysmal atrial fibrillation: Secondary | ICD-10-CM

## 2021-09-28 LAB — BASIC METABOLIC PANEL
BUN: 36 mg/dL — ABNORMAL HIGH (ref 6–23)
CO2: 29 mEq/L (ref 19–32)
Calcium: 10 mg/dL (ref 8.4–10.5)
Chloride: 97 mEq/L (ref 96–112)
Creatinine, Ser: 1.59 mg/dL — ABNORMAL HIGH (ref 0.40–1.20)
GFR: 33.77 mL/min — ABNORMAL LOW (ref >60.00)
Glucose, Bld: 93 mg/dL (ref 70–99)
Potassium: 4.3 mEq/L (ref 3.5–5.1)
Sodium: 138 mEq/L (ref 135–145)

## 2021-09-28 LAB — HEMOGLOBIN A1C: Hgb A1c MFr Bld: 6 % (ref 4.6–6.5)

## 2021-09-28 MED ORDER — TIRZEPATIDE 7.5 MG/0.5ML ~~LOC~~ SOAJ: 7.5000 mg | SUBCUTANEOUS | 1 refills | Status: DC

## 2021-10-03 ENCOUNTER — Other Ambulatory Visit: Payer: Self-pay | Admitting: Family Medicine

## 2021-10-03 ENCOUNTER — Other Ambulatory Visit: Payer: Self-pay | Admitting: Internal Medicine

## 2021-10-03 DIAGNOSIS — N183 Chronic kidney disease, stage 3 unspecified: Secondary | ICD-10-CM | POA: Diagnosis not present

## 2021-10-03 DIAGNOSIS — E7800 Pure hypercholesterolemia, unspecified: Secondary | ICD-10-CM

## 2021-10-03 DIAGNOSIS — R7989 Other specified abnormal findings of blood chemistry: Secondary | ICD-10-CM

## 2021-10-10 DIAGNOSIS — I480 Paroxysmal atrial fibrillation: Secondary | ICD-10-CM | POA: Diagnosis not present

## 2021-10-10 DIAGNOSIS — D631 Anemia in chronic kidney disease: Secondary | ICD-10-CM | POA: Diagnosis not present

## 2021-10-10 DIAGNOSIS — E210 Primary hyperparathyroidism: Secondary | ICD-10-CM | POA: Diagnosis not present

## 2021-10-10 DIAGNOSIS — N183 Chronic kidney disease, stage 3 unspecified: Secondary | ICD-10-CM | POA: Diagnosis not present

## 2021-10-10 DIAGNOSIS — Z23 Encounter for immunization: Secondary | ICD-10-CM | POA: Diagnosis not present

## 2021-10-10 DIAGNOSIS — I129 Hypertensive chronic kidney disease with stage 1 through stage 4 chronic kidney disease, or unspecified chronic kidney disease: Secondary | ICD-10-CM | POA: Diagnosis not present

## 2021-10-10 DIAGNOSIS — E1122 Type 2 diabetes mellitus with diabetic chronic kidney disease: Secondary | ICD-10-CM | POA: Diagnosis not present

## 2021-10-10 DIAGNOSIS — E875 Hyperkalemia: Secondary | ICD-10-CM | POA: Diagnosis not present

## 2021-10-18 ENCOUNTER — Encounter: Payer: Self-pay | Admitting: Cardiology

## 2021-10-27 ENCOUNTER — Encounter (HOSPITAL_COMMUNITY): Payer: Self-pay | Admitting: Internal Medicine

## 2021-10-27 NOTE — Telephone Encounter (Signed)
Reached out to the patient and she states she tried cpap for one month and then gave up on it because she kept pulling the mask off in her sleep. She is a side sleeper and when she would turn over in her sleep it would pull off. She got frustrated and gave up on using it. Reached out to [facility] to get advisement but did not reach Ivin Booty but left a message to call the patient and encouraged the patient to call [facility] to get advice of what to do.

## 2021-10-28 ENCOUNTER — Telehealth: Payer: Self-pay | Admitting: Cardiology

## 2021-10-28 NOTE — Telephone Encounter (Signed)
Patient states she has been having a lot of trouble with her CPAP machine and she is ready to give up.  Patient states she rolls around often in bed and accidentally knocks it off--this causes her to wake up several times during the night to adjust the mask which patient states is frustrating for her. She is requesting to speak with sleep coordinator to discuss.

## 2021-11-02 ENCOUNTER — Other Ambulatory Visit: Payer: Self-pay | Admitting: Family Medicine

## 2021-11-02 DIAGNOSIS — E039 Hypothyroidism, unspecified: Secondary | ICD-10-CM

## 2021-11-10 ENCOUNTER — Ambulatory Visit: Payer: Medicare PPO | Admitting: Family Medicine

## 2021-11-10 NOTE — Patient Instructions (Signed)
Good to see you again today!  Please contact me if any sign of infection from your skin tag removal site.  If bleeding apply firm pressure for 10 to 20 minutes.  If this fails to resolve bleeding please seek immediate care

## 2021-11-10 NOTE — Progress Notes (Signed)
[place] at [facility] [address] [place], [place] [address] [phone]  Date:  11/14/2021   Name:  Kathleen [name]   DOB:  1955/09/19   MRN:  [id number]  PCP:  Darreld Mclean, MD    Chief Complaint: Skin Tag (Pt c/o a skin tag on the chest that she would like to have removed because it becomes irritated by her bra rubbing against it.)   History of Present Illness:  Kathleen [name] is a 66 y.o. very pleasant female patient who presents with the following:  Patient seen today with concern of a skin tag that she would like removed Most recent visit with myself was last month-  h/o morbid obesity, chronic diastolic HF, DM2, hypothyroidism, hyperparathyroidism status post parathyroidectomy, HTN, HL and CAD with NSTEMI 2009. Sleep study 11/2013 was negative. Also has history of A fib, bipolar disorder managed by Dr Ronnald Ramp    At her last visit we increased her dose of Mounjaro from 5 to 7.5 mg.  She was having success with weight loss She notes continued success, would like to stay on current dose for now Plan for cardiac ablation per Dr. Curt Bears in November  She is currently taking Xarelto  Today Kathleen [name] notes a skin tag on her mid chest which can rub against her bra.  She would like this be removed if possible  Wt Readings from Last 3 Encounters:  11/14/21 270 lb 12.8 oz (122.8 kg)  09/28/21 278 lb 3.2 oz (126.2 kg)  08/16/21 285 lb (129.3 kg)    Patient Active Problem List   Diagnosis Date Noted   Diabetes mellitus without complication ([place]) 77/93/9030   Secondary hypercoagulable state ([place]) 06/30/2021   At increased risk of breast cancer 03/01/2021   Osteopenia 12/02/2019   AKI (acute kidney injury) ([place])    CAP (community acquired pneumonia) 03/01/2019   Atrial fibrillation with RVR ([place]) 02/28/2019   Chronic kidney disease (CKD), stage III (moderate) ([place]) 01/14/2019   Vitamin D deficiency 07/22/2018   Complete  rotator cuff tear or rupture of right shoulder, not specified as traumatic 02/27/2017   Auditory hallucination 12/13/2016   Gout 06/29/2016   Type 2 diabetes mellitus ([place]) 12/31/2015   Coronary artery dissection    Lung nodule seen on imaging study 09/24/2014   Paroxysmal atrial fibrillation ([place]) 09/11/2014   Asthmatic bronchitis with exacerbation 05/25/2014   Primary hyperparathyroidism ([place]) 04/15/2014   Chronic diastolic heart failure ([place]) 10/02/2013   Shortness of breath 08/31/2013   Venous stasis dermatitis of both lower extremities 08/19/2013   Nocturia 06/12/2013   Morbid obesity ([place]) 08/09/2012   Urinary incontinence, urge 07/11/2012   CAD (coronary artery disease) 10/31/2010   NEOPLASM OF UNCERTAIN BEHAVIOR OF SKIN 02/11/2010   Bipolar disorder ([place]) 09/14/2009   ACUT MYOCARD INFARCT UNS SITE SUBSQT EPIS CARE 07/02/2008   Hyperlipidemia 05/23/2008   UNSPECIFIED DISORDER OF THYROID 02/04/2008   HYPERTENSION, BENIGN ESSENTIAL 02/04/2008   MYOCARDIAL INFARCTION, HX OF 03/05/2007    Past Medical History:  Diagnosis Date   Anemia    Anxiety    Asthma    Atrial fibrillation ([place])    Bipolar disorder ([place])    CAD (coronary artery disease)    Cancer ([place])    basal cell carcinoma on right hand, papilloma left breast   CHF (congestive heart failure) ([place])    pt seen at heart/vascular spec clinic 08/14/2014    Chronic kidney disease  Coarse tremors    hands   Coronary artery disease    Depression    DVT (deep vein thrombosis) in pregnancy    pt. 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[place]) 02/2007   non-q-wave with second septal perforator   Hyperlipidemia    Hypertension    Hypothyroidism    history of took medication, has resolved   Knee pain, left    Lower extremity deep venous thrombosis (HCC)    20 years ago    Measles    hx of  in childhood    Morbid obesity with BMI of 40.0-44.9, adult ([place])    Pain at surgical incision    Left breast from procedure 09/11/16   Pneumonia    hx of    PONV (postoperative nausea and vomiting)    also slow to wake up   Psychiatric hospitalization 05/2008   Shingles    20 years ago    Shortness of breath dyspnea    exercise    Type 2 diabetes mellitus ([place]) 12/31/2015   Urinary incontinence    UTI (urinary tract infection)     Past Surgical History:  Procedure Laterality Date   BASAL CELL CARCINOMA EXCISION     BREAST EXCISIONAL BIOPSY Left 2018   BREAST LUMPECTOMY WITH RADIOACTIVE SEED LOCALIZATION Left 09/11/2016   Procedure: LEFT BREAST LUMPECTOMY WITH RADIOACTIVE SEED LOCALIZATION;  Surgeon: Excell Seltzer, MD;  Location: [facility];  Service: General;  Laterality: Left;   BREAST MASS EXCISION     age 4, benign tumor   CARDIAC CATHETERIZATION     CARDIAC CATHETERIZATION N/A 12/30/2015   Procedure: Left Heart Cath and Coronary Angiography;  Surgeon: Belva Crome, MD;  Location: [facility];  Service: Cardiovascular;  Laterality: N/A;   COLONOSCOPY  07/15/2009   [facility]   DILATATION & CURETTAGE/HYSTEROSCOPY WITH MYOSURE N/A 09/22/2016   Procedure: DILATATION & CURETTAGE/HYSTEROSCOPY WITH MYOSURE;  Surgeon: Molli Posey, MD;  Location: [facility];  Service: Gynecology;  Laterality: N/A;   DILATION AND CURETTAGE OF UTERUS     KNEE SURGERY     right, removed cartilage   PARATHYROIDECTOMY N/A 08/25/2014   Procedure: PARATHYROIDECTOMY;  Surgeon: Armandina Gemma, MD;  [facility];  Service: General;  Laterality: N/A;   SHOULDER ARTHROSCOPY WITH ROTATOR CUFF REPAIR AND SUBACROMIAL DECOMPRESSION Right 02/27/2017   Procedure: Right shoulder arthroscopic rotator cuff repair with biceps tenodesis and subacromial decompression;  Surgeon: Nicholes Stairs, MD;  Location: [facility];  Service: Orthopedics;  Laterality: Right;  150 mins   TONSILLECTOMY     TUBAL LIGATION       Social History   Tobacco Use   Smoking status: Never   Smokeless tobacco: Never  Vaping Use   Vaping Use: Never used  Substance Use Topics   Alcohol use: Yes    Alcohol/week: 0.0 standard drinks of alcohol    Comment: occ.   Drug use: No    Family History  Problem Relation Age of Onset   Breast cancer Mother 61   Alcohol abuse Mother    Alzheimer's disease Mother    Lung cancer Mother    Alcohol abuse Father    Alzheimer's disease Father    Parkinson's disease Father    Alcohol abuse Maternal Grandfather    Drug abuse Maternal Grandfather  Alcohol abuse Maternal Grandmother    Alcohol abuse Paternal Grandfather    Alcohol abuse Paternal Grandmother    Schizophrenia Cousin    Diabetes Brother    Hypertension Brother    Colon cancer Neg Hx    Esophageal cancer Neg Hx    Rectal cancer Neg Hx    Stomach cancer Neg Hx     Allergies  Allergen Reactions   Tetanus-Diphtheria Toxoids Td Itching    Other reaction(s): Myalgias (intolerance) Vigorous Local Soft Tissue Reaction to TDAP   Eggs Or Egg-Derived Products Hives and Other (See Comments)    Can eat foods with cooked eggs; CANNOT handle when part of flu vaccine, etc.    Tetanus Toxoids     Vigorous local reaction to tdap with local pain and itching    Lamictal [Lamotrigine] Hives and Rash    Medication list has been reviewed and updated.  Current Outpatient Medications on File Prior to Visit  Medication Sig Dispense Refill   Accu-Chek Softclix Lancets lancets Use as instructed E11.9 100 each 12   allopurinol (ZYLOPRIM) 100 MG tablet TAKE TWO TABLETS BY MOUTH DAILY 180 tablet 1   benztropine (COGENTIN) 1 MG tablet Take 1 mg by mouth 2 (two) times daily.  2   Biotin w/ Vitamins C & E (HAIR/SKIN/NAILS PO) Take 1 tablet by mouth daily.     Blood Glucose Calibration (ACCU-CHEK GUIDE CONTROL) LIQD 1 drop by In Vitro route as directed. E11.9 1 each 1   Blood Glucose Monitoring Suppl (ACCU-CHEK GUIDE ME) w/Device  KIT 1 each by Does not apply route in the morning and at bedtime. E11.9 1 kit 0   calcitRIOL (ROCALTROL) 0.25 MCG capsule TAKE ONE CAPSULE BY MOUTH DAILY 90 capsule 1   Cholecalciferol (VITAMIN D3) 50 MCG (2000 UT) capsule Take 2,000 Units by mouth daily.     divalproex (DEPAKOTE ER) 500 MG 24 hr tablet Take 1,000 mg by mouth at bedtime.     empagliflozin (JARDIANCE) 10 MG TABS tablet Take 1 tablet (10 mg total) by mouth daily. 90 tablet 3   furosemide (LASIX) 80 MG tablet Take 0.5 tablets (40 mg total) by mouth 2 (two) times daily. Can take an additional 90m as needed for weight gain of 3 pounds for more in 24 hours 135 tablet 3   glucose blood (ACCU-CHEK GUIDE) test strip Use as instructed 100 each 12   LATUDA 60 MG TABS Take 60 mg by mouth at bedtime.      levalbuterol (XOPENEX HFA) 45 MCG/ACT inhaler Inhale 2 puffs into the lungs every 4 (four) hours as needed for wheezing. 1 Inhaler 1   levothyroxine (SYNTHROID) 50 MCG tablet Take 1 tablet (50 mcg total) by mouth daily before breakfast. 90 tablet 0   metFORMIN (GLUCOPHAGE) 500 MG tablet TAKE ONE TABLET BY MOUTH TWICE A DAY WITH MEALS 180 tablet 1   metoprolol tartrate (LOPRESSOR) 50 MG tablet Take 1 tablet (50 mg total) by mouth 2 (two) times daily. 60 tablet 6   nitroGLYCERIN (NITROSTAT) 0.4 MG SL tablet Place 1 tablet (0.4 mg total) under the tongue every 5 (five) minutes as needed for chest pain. For chest pain 25 tablet 3   OLANZapine (ZYPREXA) 15 MG tablet Take 15 mg by mouth at bedtime.     rosuvastatin (CRESTOR) 20 MG tablet TAKE ONE TABLET BY MOUTH DAILY 90 tablet 2   sacubitril-valsartan (ENTRESTO) 24-26 MG Take 1 tablet by mouth 2 (two) times daily. 60 tablet 11   tirzepatide (  MOUNJARO) 7.5 MG/0.5ML Pen Inject 7.5 mg into the skin once a week. 6 mL 1   XARELTO 20 MG TABS tablet TAKE ONE TABLET BY MOUTH AT BEDTIME 90 tablet 3   No current facility-administered medications on file prior to visit.    Review of Systems:  As per  HPI- otherwise negative.   Physical Examination: Vitals:   11/14/21 1052  BP: 124/80  Pulse: 91  Resp: 18  Temp: 97.9 F (36.6 C)  SpO2: 98%   Vitals:   11/14/21 1052  Weight: 270 lb 12.8 oz (122.8 kg)  Height: '5\' 11"'  (1.803 m)   Body mass index is 37.77 kg/m. Ideal Body Weight: Weight in (lb) to have BMI = 25: 178.9  GEN: No acute distress; alert,appropriate. PULM: Breathing comfortably in no respiratory distress PSYCH: Normally interactive.  There is a raised, flesh-colored likely seborrheic keratosis on the patient's chest between her upper breast area.  Approximately 3 mm in diameter.  Area prepped with alcohol.  I was unable to actually pull this tiny lesion off with my fingers.  Hemostasis with silver nitrate stick and bandage Patient tolerated procedure well with no complications.  Estimated blood loss less than 1 mL liter   Assessment and Plan: Seborrheic keratosis As above, removed tiny seborrheic keratosis No complications, dressed with Band-Aid Patient is asked to let me know if any concerns bleeding or infection No charge today  Signed Lamar Blinks, MD

## 2021-11-14 ENCOUNTER — Ambulatory Visit: Payer: Medicare PPO | Admitting: Family Medicine

## 2021-11-14 VITALS — BP 124/80 | HR 91 | Temp 97.9°F | Resp 18 | Ht 71.0 in | Wt 270.8 lb

## 2021-11-14 DIAGNOSIS — L821 Other seborrheic keratosis: Secondary | ICD-10-CM

## 2021-11-15 DIAGNOSIS — H16143 Punctate keratitis, bilateral: Secondary | ICD-10-CM | POA: Diagnosis not present

## 2021-11-15 DIAGNOSIS — H0288B Meibomian gland dysfunction left eye, upper and lower eyelids: Secondary | ICD-10-CM | POA: Diagnosis not present

## 2021-11-15 DIAGNOSIS — H0288A Meibomian gland dysfunction right eye, upper and lower eyelids: Secondary | ICD-10-CM | POA: Diagnosis not present

## 2021-11-15 DIAGNOSIS — E119 Type 2 diabetes mellitus without complications: Secondary | ICD-10-CM | POA: Diagnosis not present

## 2021-11-15 LAB — HM DIABETES EYE EXAM

## 2021-11-18 NOTE — Telephone Encounter (Signed)
Reached out to patient lmtcb.

## 2021-11-18 NOTE — Telephone Encounter (Signed)
Called [facility] and she can come in Monday 10/30 at 3:30.

## 2021-11-21 ENCOUNTER — Encounter: Payer: Self-pay | Admitting: Cardiology

## 2021-11-21 NOTE — Telephone Encounter (Signed)
Patient returned your call.

## 2021-11-22 DIAGNOSIS — Z01812 Encounter for preprocedural laboratory examination: Secondary | ICD-10-CM | POA: Diagnosis not present

## 2021-11-22 DIAGNOSIS — I480 Paroxysmal atrial fibrillation: Secondary | ICD-10-CM | POA: Diagnosis not present

## 2021-11-22 NOTE — Telephone Encounter (Signed)
Returned call to patient lmtcb.

## 2021-11-23 LAB — BASIC METABOLIC PANEL
BUN/Creatinine Ratio: 20 (ref 12–28)
BUN: 29 mg/dL — ABNORMAL HIGH (ref 8–27)
CO2: 23 mmol/L (ref 20–29)
Calcium: 10.1 mg/dL (ref 8.7–10.3)
Chloride: 99 mmol/L (ref 96–106)
Creatinine, Ser: 1.48 mg/dL — ABNORMAL HIGH (ref 0.57–1.00)
Glucose: 106 mg/dL — ABNORMAL HIGH (ref 70–99)
Potassium: 4.6 mmol/L (ref 3.5–5.2)
Sodium: 141 mmol/L (ref 134–144)
eGFR: 39 mL/min/{1.73_m2} — ABNORMAL LOW (ref >59)

## 2021-11-23 LAB — CBC
Hematocrit: 47.3 % — ABNORMAL HIGH (ref 34.0–46.6)
Hemoglobin: 15.7 g/dL (ref 11.1–15.9)
MCH: 29 pg (ref 26.6–33.0)
MCHC: 33.2 g/dL (ref 31.5–35.7)
MCV: 87 fL (ref 79–97)
Platelets: 147 10*3/uL — ABNORMAL LOW (ref 150–450)
RBC: 5.41 x10E6/uL — ABNORMAL HIGH (ref 3.77–5.28)
RDW: 13.6 % (ref 11.7–15.4)
WBC: 4.9 10*3/uL (ref 3.4–10.8)

## 2021-11-28 ENCOUNTER — Telehealth (HOSPITAL_COMMUNITY): Payer: Self-pay | Admitting: *Deleted

## 2021-11-28 NOTE — Telephone Encounter (Signed)
Reaching out to patient to offer assistance regarding upcoming cardiac imaging study; pt verbalizes understanding of appt date/time, parking situation and where to check in, and verified current allergies; name and call back number provided for further questions should they arise  Gordy Clement RN Navigator Cardiac Imaging [facility] [phone] office [phone] cell  Patient to take her AM metoprolol two hours prior to her cardiac CT scan.

## 2021-11-29 ENCOUNTER — Encounter: Payer: Self-pay | Admitting: Cardiology

## 2021-11-29 ENCOUNTER — Other Ambulatory Visit: Payer: Self-pay

## 2021-11-29 DIAGNOSIS — E119 Type 2 diabetes mellitus without complications: Secondary | ICD-10-CM

## 2021-11-29 MED ORDER — ACCU-CHEK GUIDE ME W/DEVICE KIT: 1.0000 | PACK | Freq: Two times a day (BID) | 0 refills | Status: DC

## 2021-11-29 NOTE — Telephone Encounter (Signed)
Error

## 2021-11-30 ENCOUNTER — Ambulatory Visit (HOSPITAL_BASED_OUTPATIENT_CLINIC_OR_DEPARTMENT_OTHER)
Admission: RE | Admit: 2021-11-30 | Discharge: 2021-11-30 | Disposition: A | Discharge status: Home / Self Care | Payer: Medicare PPO | Source: Ambulatory Visit | Attending: Cardiology | Admitting: Cardiology

## 2021-11-30 DIAGNOSIS — I4819 Other persistent atrial fibrillation: Secondary | ICD-10-CM | POA: Insufficient documentation

## 2021-11-30 MED ORDER — IOHEXOL 350 MG/ML SOLN
80.0000 mL | Freq: Once | INTRAVENOUS | Status: AC | PRN
  Administered 2021-11-30: 80 mL via INTRAVENOUS

## 2021-12-01 DIAGNOSIS — F3112 Bipolar disorder, current episode manic without psychotic features, moderate: Secondary | ICD-10-CM | POA: Diagnosis not present

## 2021-12-01 DIAGNOSIS — F411 Generalized anxiety disorder: Secondary | ICD-10-CM | POA: Diagnosis not present

## 2021-12-05 NOTE — Pre-Procedure Instructions (Signed)
Instructed patient on the following items: Arrival time 1100 Nothing to eat or drink after midnight No meds AM of procedure Responsible person to drive you home and stay with you for 24 hrs  Have you missed any doses of anti-coagulant Xarelto- hasn't missed any doses    

## 2021-12-06 ENCOUNTER — Ambulatory Visit (HOSPITAL_COMMUNITY)
Admission: RE | Disposition: A | Discharge status: Home / Self Care | Payer: Medicare PPO | Source: Home / Self Care | Attending: Cardiology

## 2021-12-06 ENCOUNTER — Ambulatory Visit (HOSPITAL_BASED_OUTPATIENT_CLINIC_OR_DEPARTMENT_OTHER): Payer: Medicare PPO | Admitting: Certified Registered Nurse Anesthetist

## 2021-12-06 ENCOUNTER — Ambulatory Visit (HOSPITAL_COMMUNITY): Payer: Medicare PPO | Admitting: Certified Registered Nurse Anesthetist

## 2021-12-06 ENCOUNTER — Other Ambulatory Visit: Payer: Self-pay

## 2021-12-06 ENCOUNTER — Ambulatory Visit (HOSPITAL_COMMUNITY)
Admission: RE | Admit: 2021-12-06 | Discharge: 2021-12-06 | Disposition: A | Discharge status: Home / Self Care | Payer: Medicare PPO | Attending: Cardiology | Admitting: Cardiology

## 2021-12-06 DIAGNOSIS — I509 Heart failure, unspecified: Secondary | ICD-10-CM

## 2021-12-06 DIAGNOSIS — N189 Chronic kidney disease, unspecified: Secondary | ICD-10-CM | POA: Insufficient documentation

## 2021-12-06 DIAGNOSIS — E039 Hypothyroidism, unspecified: Secondary | ICD-10-CM | POA: Insufficient documentation

## 2021-12-06 DIAGNOSIS — I480 Paroxysmal atrial fibrillation: Secondary | ICD-10-CM | POA: Diagnosis not present

## 2021-12-06 DIAGNOSIS — I2510 Atherosclerotic heart disease of native coronary artery without angina pectoris: Secondary | ICD-10-CM

## 2021-12-06 DIAGNOSIS — I4891 Unspecified atrial fibrillation: Secondary | ICD-10-CM | POA: Diagnosis not present

## 2021-12-06 DIAGNOSIS — I252 Old myocardial infarction: Secondary | ICD-10-CM | POA: Insufficient documentation

## 2021-12-06 DIAGNOSIS — I130 Hypertensive heart and chronic kidney disease with heart failure and stage 1 through stage 4 chronic kidney disease, or unspecified chronic kidney disease: Secondary | ICD-10-CM | POA: Diagnosis not present

## 2021-12-06 DIAGNOSIS — J45909 Unspecified asthma, uncomplicated: Secondary | ICD-10-CM | POA: Insufficient documentation

## 2021-12-06 DIAGNOSIS — G4733 Obstructive sleep apnea (adult) (pediatric): Secondary | ICD-10-CM | POA: Diagnosis not present

## 2021-12-06 DIAGNOSIS — I110 Hypertensive heart disease with heart failure: Secondary | ICD-10-CM | POA: Diagnosis not present

## 2021-12-06 DIAGNOSIS — E785 Hyperlipidemia, unspecified: Secondary | ICD-10-CM | POA: Diagnosis not present

## 2021-12-06 DIAGNOSIS — Z6838 Body mass index (BMI) 38.0-38.9, adult: Secondary | ICD-10-CM | POA: Diagnosis not present

## 2021-12-06 DIAGNOSIS — I5032 Chronic diastolic (congestive) heart failure: Secondary | ICD-10-CM | POA: Insufficient documentation

## 2021-12-06 DIAGNOSIS — E1122 Type 2 diabetes mellitus with diabetic chronic kidney disease: Secondary | ICD-10-CM | POA: Insufficient documentation

## 2021-12-06 HISTORY — PX: ATRIAL FIBRILLATION ABLATION: EP1191

## 2021-12-06 LAB — GLUCOSE, CAPILLARY
Glucose-Capillary: 104 mg/dL — ABNORMAL HIGH (ref 70–99)
Glucose-Capillary: 94 mg/dL (ref 70–99)

## 2021-12-06 LAB — POCT ACTIVATED CLOTTING TIME
Activated Clotting Time: 383 seconds
Activated Clotting Time: 353 seconds

## 2021-12-06 SURGERY — ATRIAL FIBRILLATION ABLATION
Anesthesia: General

## 2021-12-06 MED ORDER — ACETAMINOPHEN 500 MG PO TABS
ORAL_TABLET | ORAL | Status: AC
  Administered 2021-12-06: 1000 mg via ORAL
  Filled 2021-12-06: qty 2

## 2021-12-06 MED ORDER — ROCURONIUM BROMIDE 10 MG/ML (PF) SYRINGE
PREFILLED_SYRINGE | INTRAVENOUS | Status: DC | PRN
  Administered 2021-12-06: 60 mg via INTRAVENOUS

## 2021-12-06 MED ORDER — HEPARIN SODIUM (PORCINE) 1000 UNIT/ML IJ SOLN
INTRAMUSCULAR | Status: DC | PRN
  Administered 2021-12-06: 1000 [IU] via INTRAVENOUS

## 2021-12-06 MED ORDER — HEPARIN SODIUM (PORCINE) 1000 UNIT/ML IJ SOLN
INTRAMUSCULAR | Status: AC
  Filled 2021-12-06: qty 10

## 2021-12-06 MED ORDER — MIDAZOLAM HCL 2 MG/2ML IJ SOLN
INTRAMUSCULAR | Status: DC | PRN
  Administered 2021-12-06: 2 mg via INTRAVENOUS

## 2021-12-06 MED ORDER — SODIUM CHLORIDE 0.9% FLUSH: 3.0000 mL | INTRAVENOUS | Status: DC | PRN

## 2021-12-06 MED ORDER — DEXAMETHASONE SODIUM PHOSPHATE 10 MG/ML IJ SOLN
INTRAMUSCULAR | Status: DC | PRN
  Administered 2021-12-06: 10 mg via INTRAVENOUS

## 2021-12-06 MED ORDER — DOBUTAMINE INFUSION FOR EP/ECHO/NUC (1000 MCG/ML)
INTRAVENOUS | Status: AC
  Filled 2021-12-06: qty 250

## 2021-12-06 MED ORDER — SUGAMMADEX SODIUM 200 MG/2ML IV SOLN
INTRAVENOUS | Status: DC | PRN
  Administered 2021-12-06: 200 mg via INTRAVENOUS

## 2021-12-06 MED ORDER — SODIUM CHLORIDE 0.9 % IV SOLN: 250.0000 mL | INTRAVENOUS | Status: DC | PRN

## 2021-12-06 MED ORDER — PHENYLEPHRINE 80 MCG/ML (10ML) SYRINGE FOR IV PUSH (FOR BLOOD PRESSURE SUPPORT)
PREFILLED_SYRINGE | INTRAVENOUS | Status: DC | PRN
  Administered 2021-12-06 (×2): 80 ug via INTRAVENOUS

## 2021-12-06 MED ORDER — ONDANSETRON HCL 4 MG/2ML IJ SOLN
INTRAMUSCULAR | Status: DC | PRN
  Administered 2021-12-06: 4 mg via INTRAVENOUS

## 2021-12-06 MED ORDER — PROPOFOL 500 MG/50ML IV EMUL
INTRAVENOUS | Status: DC | PRN
  Administered 2021-12-06: 100 ug/kg/min via INTRAVENOUS

## 2021-12-06 MED ORDER — ONDANSETRON HCL 4 MG/2ML IJ SOLN: 4.0000 mg | Freq: Four times a day (QID) | INTRAMUSCULAR | Status: DC | PRN

## 2021-12-06 MED ORDER — PROTAMINE SULFATE 10 MG/ML IV SOLN
INTRAVENOUS | Status: DC | PRN
  Administered 2021-12-06: 40 mg via INTRAVENOUS

## 2021-12-06 MED ORDER — HEPARIN (PORCINE) IN NACL 1000-0.9 UT/500ML-% IV SOLN
INTRAVENOUS | Status: DC | PRN
  Administered 2021-12-06 (×4): 500 mL

## 2021-12-06 MED ORDER — HEPARIN SODIUM (PORCINE) 1000 UNIT/ML IJ SOLN
INTRAMUSCULAR | Status: DC | PRN
  Administered 2021-12-06: 15000 [IU] via INTRAVENOUS

## 2021-12-06 MED ORDER — PHENYLEPHRINE HCL-NACL 20-0.9 MG/250ML-% IV SOLN
INTRAVENOUS | Status: DC | PRN
  Administered 2021-12-06: 30 ug/min via INTRAVENOUS

## 2021-12-06 MED ORDER — DOBUTAMINE INFUSION FOR EP/ECHO/NUC (1000 MCG/ML)
INTRAVENOUS | Status: DC | PRN
  Administered 2021-12-06: 10 ug/kg/min via INTRAVENOUS

## 2021-12-06 MED ORDER — ACETAMINOPHEN 500 MG PO TABS: 1000.0000 mg | ORAL_TABLET | Freq: Once | ORAL | Status: AC

## 2021-12-06 MED ORDER — SODIUM CHLORIDE 0.9 % IV SOLN: INTRAVENOUS | Status: DC

## 2021-12-06 MED ORDER — LIDOCAINE 2% (20 MG/ML) 5 ML SYRINGE
INTRAMUSCULAR | Status: DC | PRN
  Administered 2021-12-06: 60 mg via INTRAVENOUS

## 2021-12-06 MED ORDER — ACETAMINOPHEN 325 MG PO TABS: 650.0000 mg | ORAL_TABLET | ORAL | Status: DC | PRN

## 2021-12-06 MED ORDER — PROPOFOL 10 MG/ML IV BOLUS
INTRAVENOUS | Status: DC | PRN
  Administered 2021-12-06: 100 mg via INTRAVENOUS

## 2021-12-06 MED ORDER — FENTANYL CITRATE (PF) 250 MCG/5ML IJ SOLN
INTRAMUSCULAR | Status: DC | PRN
  Administered 2021-12-06: 100 ug via INTRAVENOUS

## 2021-12-06 SURGICAL SUPPLY — 20 items
CATH 8FR REPROCESSED SOUNDSTAR (CATHETERS) ×1 IMPLANT
CATH 8FR SOUNDSTAR REPROCESSED (CATHETERS) IMPLANT
CATH ABLAT QDOT MICRO BI TC FJ (CATHETERS) IMPLANT
CATH OCTARAY 2.0 F 3-3-3-3-3 (CATHETERS) IMPLANT
CATH PIGTAIL STEERABLE D1 8.7 (WIRE) IMPLANT
CATH S-M CIRCA TEMP PROBE (CATHETERS) IMPLANT
CATH WEB BI DIR CSDF CRV REPRO (CATHETERS) IMPLANT
CLOSURE PERCLOSE PROSTYLE (VASCULAR PRODUCTS) IMPLANT
COVER SWIFTLINK CONNECTOR (BAG) ×1 IMPLANT
MAT PREVALON FULL STRYKER (MISCELLANEOUS) IMPLANT
PACK EP LATEX FREE (CUSTOM PROCEDURE TRAY) ×1
PACK EP LF (CUSTOM PROCEDURE TRAY) ×1 IMPLANT
PAD DEFIB RADIO PHYSIO CONN (PAD) ×1 IMPLANT
PATCH CARTO3 (PAD) IMPLANT
SHEATH CARTO VIZIGO SM CVD (SHEATH) IMPLANT
SHEATH PINNACLE 7F 10CM (SHEATH) IMPLANT
SHEATH PINNACLE 8F 10CM (SHEATH) IMPLANT
SHEATH PINNACLE 9F 10CM (SHEATH) IMPLANT
SHEATH PROBE COVER 6X72 (BAG) IMPLANT
TUBING SMART ABLATE COOLFLOW (TUBING) IMPLANT

## 2021-12-06 NOTE — H&P (Signed)
Electrophysiology Office Note   Date:  12/06/2021   ID:  Kathleen [name], DOB 1955/09/17, MRN [id number]  PCP:  Darreld Mclean, MD  Cardiologist: [place] Primary Electrophysiologist:  Delaynee Alred Meredith Leeds, MD    Chief Complaint: Atrial fibrillation   History of Present Illness: Kathleen [name] is a 66 y.o. female who is being seen today for the evaluation of atrial fibrillation at the request of No ref. provider found. Presenting today for electrophysiology evaluation.  She has a history significant for chronic diastolic heart failure, diabetes, hypertension, hyperlipidemia, coronary artery disease, obstructive sleep apnea, atrial fibrillation.  She was admitted in 2016 for rapid atrial fibrillation.  She was again admitted for atrial fibrillation in 2021.  She had a cardiac monitor placed May 2023 that showed a 15% atrial fibrillation burden.  She has intermittent fatigue and weakness.  She would prefer to remain in normal rhythm.  She also has morbid obesity, but has been going to the gym and working out.  She has lost a significant amount of weight.  Today, denies symptoms of palpitations, chest pain, shortness of breath, orthopnea, PND, lower extremity edema, claudication, dizziness, presyncope, syncope, bleeding, or neurologic sequela. The patient is tolerating medications without difficulties. Plan ablation today.    Past Medical History:  Diagnosis Date   Anemia    Anxiety    Asthma    Atrial fibrillation ([place])    Bipolar disorder ([place])    CAD (coronary artery disease)    Cancer (HCC)    basal cell carcinoma on right hand, papilloma left breast   CHF (congestive heart failure) ([place])    pt seen at heart/vascular spec clinic 08/14/2014    Chronic kidney disease    Coarse tremors    hands   Coronary artery disease    Depression    DVT (deep vein thrombosis) in pregnancy    pt. reports that it was post knee surgery, not during pregnancy   Dyslipidemia    Fall     Fatty liver    Headache    migraines - stopped after menopause   Heart attack ([place]) 02/2007   non-q-wave with second septal perforator   Hyperlipidemia    Hypertension    Hypothyroidism    history of took medication, has resolved   Knee pain, left    Lower extremity deep venous thrombosis (HCC)    20 years ago    Measles    hx of in childhood    Morbid obesity with BMI of 40.0-44.9, adult ([place])    Pain at surgical incision    Left breast from procedure 09/11/16   Pneumonia    hx of    PONV (postoperative nausea and vomiting)    also slow to wake up   Psychiatric hospitalization 05/2008   Shingles    20 years ago    Shortness of breath dyspnea    exercise    Type 2 diabetes mellitus ([place]) 12/31/2015   Urinary incontinence    UTI (urinary tract infection)    Past Surgical History:  Procedure Laterality Date   BASAL CELL CARCINOMA EXCISION     BREAST EXCISIONAL BIOPSY Left 2018   BREAST LUMPECTOMY WITH RADIOACTIVE SEED LOCALIZATION Left 09/11/2016   Procedure: LEFT BREAST LUMPECTOMY WITH RADIOACTIVE SEED LOCALIZATION;  Surgeon: Excell Seltzer, MD;  Location: [facility];  Service: General;  Laterality: Left;   BREAST MASS EXCISION     age 10, benign tumor   CARDIAC CATHETERIZATION  CARDIAC CATHETERIZATION N/A 12/30/2015   Procedure: Left Heart Cath and Coronary Angiography;  Surgeon: Belva Crome, MD;  Location: [facility];  Service: Cardiovascular;  Laterality: N/A;   COLONOSCOPY  07/15/2009   [facility]   DILATATION & CURETTAGE/HYSTEROSCOPY WITH MYOSURE N/A 09/22/2016   Procedure: DILATATION & CURETTAGE/HYSTEROSCOPY WITH MYOSURE;  Surgeon: Molli Posey, MD;  Location: [facility];  Service: Gynecology;  Laterality: N/A;   DILATION AND CURETTAGE OF UTERUS     KNEE SURGERY     right, removed cartilage   PARATHYROIDECTOMY N/A 08/25/2014   Procedure: PARATHYROIDECTOMY;  Surgeon: Armandina Gemma, MD;  Location: [facility];  Service: General;  Laterality: N/A;   SHOULDER  ARTHROSCOPY WITH ROTATOR CUFF REPAIR AND SUBACROMIAL DECOMPRESSION Right 02/27/2017   Procedure: Right shoulder arthroscopic rotator cuff repair with biceps tenodesis and subacromial decompression;  Surgeon: Nicholes Stairs, MD;  Location: [facility];  Service: Orthopedics;  Laterality: Right;  150 mins   TONSILLECTOMY     TUBAL LIGATION       Current Facility-Administered Medications  Medication Dose Route Frequency Provider Last Rate Last Admin   0.9 %  sodium chloride infusion   Intravenous Continuous Constance Haw, MD 50 mL/hr at 12/06/21 1116 New Bag at 12/06/21 1116    Allergies:   Tetanus-diphtheria toxoids td, Influenza vaccines, Tetanus toxoids, and Lamictal [lamotrigine]   Social History:  The patient  reports that she has never smoked. She has never used smokeless tobacco. She reports current alcohol use. She reports that she does not use drugs.   Family History:  The patient's family history includes Alcohol abuse in her father, maternal grandfather, maternal grandmother, mother, paternal grandfather, and paternal grandmother; Alzheimer's disease in her father and mother; Breast cancer (age of onset: 47) in her mother; Diabetes in her brother; Drug abuse in her maternal grandfather; Hypertension in her brother; Lung cancer in her mother; Parkinson's disease in her father; Schizophrenia in her cousin.    ROS:  Please see the history of present illness.   Otherwise, review of systems is positive for none.   All other systems are reviewed and negative.   PHYSICAL EXAM: VS:  BP (!) 99/59   Pulse 78   Temp 98.1 F (36.7 C) (Oral)   Resp 16   Ht '5\' 10"'$  (1.778 m)   Wt 121.6 kg   SpO2 94%   BMI 38.45 kg/m  , BMI Body mass index is 38.45 kg/m. GEN: Well nourished, well developed, in no acute distress  HEENT: normal  Neck: no JVD, carotid bruits, or masses Cardiac: iRRR; no murmurs, rubs, or gallops,no edema  Respiratory:  clear to auscultation bilaterally, normal work  of breathing GI: soft, nontender, nondistended, + BS MS: no deformity or atrophy  Skin: warm and dry Neuro:  Strength and sensation are intact Psych: euthymic mood, full affect  Recent Labs: 02/15/2021: B Natriuretic Peptide 75.7 05/26/2021: ALT 18; TSH 1.92 11/22/2021: BUN 29; Creatinine, Ser 1.48; Hemoglobin 15.7; Platelets 147; Potassium 4.6; Sodium 141    Lipid Panel     Component Value Date/Time   CHOL 138 09/29/2020 1042   TRIG 124.0 09/29/2020 1042   HDL 44.60 09/29/2020 1042   CHOLHDL 3 09/29/2020 1042   VLDL 24.8 09/29/2020 1042   LDLCALC 69 09/29/2020 1042   LDLDIRECT 166.0 08/03/2016 0834     Wt Readings from Last 3 Encounters:  12/06/21 121.6 kg  11/14/21 122.8 kg  09/28/21 126.2 kg      Other studies Reviewed:  Additional studies/ records that were reviewed today include: TTE 02/15/21  Review of the above records today demonstrates:   1. Left ventricular ejection fraction, by estimation, is 55 to 60%. The  left ventricle has normal function. The left ventricle has no regional  wall motion abnormalities. Left ventricular diastolic parameters are  consistent with Grade I diastolic  dysfunction (impaired relaxation). The average left ventricular global  longitudinal strain is -21.6 %. The global longitudinal strain is normal.   2. Right ventricular systolic function is mildly reduced. The right  ventricular size is normal. There is normal pulmonary artery systolic  pressure.   3. The mitral valve is normal in structure. No evidence of mitral valve  regurgitation. No evidence of mitral stenosis.   4. Tricuspid valve regurgitation is mild to moderate.   5. The aortic valve is normal in structure. Aortic valve regurgitation is  mild. No aortic stenosis is present.   6. The inferior vena cava is normal in size with greater than 50%  respiratory variability, suggesting right atrial pressure of 3 mmHg.   Cardiac monitor 06/21/2021 personally reviewed 1. Sinus  rhythm -  avg HR of 81 bpm. 2. Three runs of Supraventricular Tachycardia occurred, the run with the fastest interval lasting 22.4 secs with a max rate of 148 bpm (avg 111bpm); the run with the fastest interval was also the longest.  3. Atrial Fibrillation/Flutter occurred (15% burden), ranging from 72-201 bpm (avg of 128  bpm), the longest lasting 12 hours 1 min with an avg rate of 117 bpm.  4. Rare PACs and PVCs 5. Multiple patient-triggered events associated with sinus rhythm, isolated PACs, isolated PVCs and/or AF.   ASSESSMENT AND PLAN:  1.  Paroxysmal atrial fibrillation: Kathleen [name] has presented today for surgery, with the diagnosis of AF.  The various methods of treatment have been discussed with the patient and family. After consideration of risks, benefits and other options for treatment, the patient has consented to  Procedure(s): Catheter ablation as a surgical intervention .  Risks include but not limited to complete heart block, stroke, esophageal damage, nerve damage, bleeding, vascular damage, tamponade, perforation, MI, and death. The patient's history has been reviewed, patient examined, no change in status, stable for surgery.  I have reviewed the patient's chart and labs.  Questions were answered to the patient's satisfaction.    Kathleen Whiten Curt Bears, MD 12/06/2021 11:37 AM

## 2021-12-06 NOTE — Transfer of Care (Signed)
Immediate Anesthesia Transfer of Care Note  Patient: Kathleen [name]  Procedure(s) Performed: ATRIAL FIBRILLATION ABLATION  Patient Location: Cath Lab  Anesthesia Type:General  Level of Consciousness: awake, alert , oriented, and patient cooperative  Airway & Oxygen Therapy: Patient Spontanous Breathing and Patient connected to nasal cannula oxygen  Post-op Assessment: Report given to RN, Post -op Vital signs reviewed and stable, and Patient moving all extremities X 4  Post vital signs: Reviewed and stable  Last Vitals:  Vitals Value Taken Time  BP 95/61   Temp    Pulse 72   Resp 14   SpO2 94     Last Pain:  Vitals:   12/06/21 1121  TempSrc: Oral         Complications:  Encounter Notable Events  Notable Event Outcome Phase Comment  Difficult to intubate - expected  Intraprocedure Filed from anesthesia note documentation.

## 2021-12-06 NOTE — Discharge Instructions (Signed)

## 2021-12-06 NOTE — Anesthesia Procedure Notes (Addendum)
Procedure Name: Intubation Date/Time: 12/06/2021 12:35 PM  Performed by: Darletta Moll, CRNAPre-anesthesia Checklist: Patient identified, Emergency Drugs available, Suction available and Patient being monitored Patient Re-evaluated:Patient Re-evaluated prior to induction Oxygen Delivery Method: Circle system utilized Preoxygenation: Pre-oxygenation with 100% oxygen Induction Type: IV induction Ventilation: Mask ventilation without difficulty Laryngoscope Size: Glidescope and 3 Grade View: Grade I Tube type: Oral Tube size: 7.0 mm Number of attempts: 1 Airway Equipment and Method: Stylet and Oral airway Placement Confirmation: ETT inserted through vocal cords under direct vision, positive ETCO2 and breath sounds checked- equal and bilateral Secured at: 21 cm Tube secured with: Tape Dental Injury: Teeth and Oropharynx as per pre-operative assessment  Difficulty Due To: Difficulty was anticipated Comments: Previous Grade 2 view intubations, decision made to use Glidescope

## 2021-12-06 NOTE — Anesthesia Preprocedure Evaluation (Addendum)
Anesthesia Evaluation  Patient identified by MRN, date of birth, ID band Patient awake    Reviewed: Allergy & Precautions, H&P , NPO status , Patient's Chart, lab work & pertinent test results  History of Anesthesia Complications (+) PONV and history of anesthetic complications  Airway Mallampati: III  TM Distance: >3 FB Neck ROM: Full    Dental no notable dental hx. (+) Teeth Intact, Dental Advisory Given   Pulmonary asthma    Pulmonary exam normal breath sounds clear to auscultation       Cardiovascular hypertension, Pt. on medications and Pt. on home beta blockers + CAD, + Past MI and +CHF  + dysrhythmias Atrial Fibrillation  Rhythm:Regular Rate:Normal     Neuro/Psych  Headaches  Anxiety Depression Bipolar Disorder      GI/Hepatic negative GI ROS, Neg liver ROS,,,  Endo/Other  diabetes, Type 2, Oral Hypoglycemic AgentsHypothyroidism    Renal/GU negative Renal ROS  negative genitourinary   Musculoskeletal   Abdominal   Peds  Hematology  (+) Blood dyscrasia, anemia   Anesthesia Other Findings   Reproductive/Obstetrics negative OB ROS                             Anesthesia Physical Anesthesia Plan  ASA: 3  Anesthesia Plan: General   Post-op Pain Management: Tylenol PO (pre-op)*   Induction: Intravenous  PONV Risk Score and Plan: 4 or greater and Ondansetron, Dexamethasone, Propofol infusion, TIVA and Midazolam  Airway Management Planned: Oral ETT  Additional Equipment:   Intra-op Plan:   Post-operative Plan: Extubation in OR  Informed Consent: I have reviewed the patients History and Physical, chart, labs and discussed the procedure including the risks, benefits and alternatives for the proposed anesthesia with the patient or authorized representative who has indicated his/her understanding and acceptance.     Dental advisory given  Plan Discussed with:  CRNA  Anesthesia Plan Comments:        Anesthesia Quick Evaluation

## 2021-12-06 NOTE — Anesthesia Postprocedure Evaluation (Signed)
Anesthesia Post Note  Patient: Kiarah Eckstein  Procedure(s) Performed: ATRIAL FIBRILLATION ABLATION     Patient location during evaluation: PACU Anesthesia Type: General Level of consciousness: awake and alert Pain management: pain level controlled Vital Signs Assessment: post-procedure vital signs reviewed and stable Respiratory status: spontaneous breathing, nonlabored ventilation and respiratory function stable Cardiovascular status: blood pressure returned to baseline and stable Postop Assessment: no apparent nausea or vomiting Anesthetic complications: yes  Encounter Notable Events  Notable Event Outcome Phase Comment  Difficult to intubate - expected  Intraprocedure Filed from anesthesia note documentation.    Last Vitals:  Vitals:   12/06/21 1525 12/06/21 1528  BP: 107/65 112/69  Pulse: 89 90  Resp:  17  Temp:    SpO2: 97% 95%    Last Pain:  Vitals:   12/06/21 1528  TempSrc:   PainSc: 0-No pain                 Aneth Schlagel,W. EDMOND

## 2021-12-07 ENCOUNTER — Encounter (HOSPITAL_COMMUNITY): Payer: Self-pay | Admitting: Cardiology

## 2021-12-07 MED FILL — Lidocaine HCl Local Preservative Free (PF) Inj 1%: INTRAMUSCULAR | Qty: 30 | Status: AC

## 2021-12-08 ENCOUNTER — Encounter: Payer: Self-pay | Admitting: Cardiology

## 2021-12-08 NOTE — Telephone Encounter (Signed)
Error

## 2021-12-12 ENCOUNTER — Ambulatory Visit: Payer: Medicare PPO | Attending: Cardiology | Admitting: Cardiology

## 2021-12-12 ENCOUNTER — Encounter: Payer: Self-pay | Admitting: Cardiology

## 2021-12-12 VITALS — BP 110/60 | HR 96 | Ht 70.0 in | Wt 263.0 lb

## 2021-12-12 DIAGNOSIS — G4733 Obstructive sleep apnea (adult) (pediatric): Secondary | ICD-10-CM | POA: Diagnosis not present

## 2021-12-12 DIAGNOSIS — I10 Essential (primary) hypertension: Secondary | ICD-10-CM

## 2021-12-12 NOTE — Patient Instructions (Signed)
Medication Instructions:  Your physician recommends that you continue on your current medications as directed. Please refer to the Current Medication list given to you today.  *If you need a refill on your cardiac medications before your next appointment, please call your pharmacy*   Follow-Up: At [facility], you and your health needs are our priority.  As part of our continuing mission to provide you with exceptional heart care, we have created designated Provider Care Teams.  These Care Teams include your primary Cardiologist (physician) and Advanced Practice Providers (APPs -  Physician Assistants and Nurse Practitioners) who all work together to provide you with the care you need, when you need it.  We recommend signing up for the patient portal called "MyChart".  Sign up information is provided on this After Visit Summary.  MyChart is used to connect with patients for Virtual Visits (Telemedicine).  Patients are able to view lab/test results, encounter notes, upcoming appointments, etc.  Non-urgent messages can be sent to your provider as well.   To learn more about what you can do with MyChart, go to [email or web address].    Your next appointment:   1 year(s)  The format for your next appointment:   In Person  Provider:   Dr. Ceasar Mons  Important Information About Sugar
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
## 2021-12-13 ENCOUNTER — Telehealth: Payer: Self-pay | Admitting: *Deleted

## 2021-12-13 DIAGNOSIS — I5032 Chronic diastolic (congestive) heart failure: Secondary | ICD-10-CM

## 2021-12-13 DIAGNOSIS — G4733 Obstructive sleep apnea (adult) (pediatric): Secondary | ICD-10-CM

## 2021-12-13 DIAGNOSIS — I10 Essential (primary) hypertension: Secondary | ICD-10-CM

## 2021-12-13 DIAGNOSIS — H16143 Punctate keratitis, bilateral: Secondary | ICD-10-CM | POA: Diagnosis not present

## 2021-12-13 NOTE — Telephone Encounter (Signed)
Order placed to [facility] via community message. 

## 2021-12-13 NOTE — Telephone Encounter (Signed)
-----   Message from Tor Netters, RN sent at 12/12/2021 10:08 AM EST ----- Regarding: Supplies Hi  This patient needs sleep supplies.  Thanks MA

## 2022-01-03 ENCOUNTER — Encounter (HOSPITAL_COMMUNITY): Payer: Self-pay | Admitting: Physician Assistant

## 2022-01-03 ENCOUNTER — Ambulatory Visit (HOSPITAL_COMMUNITY)
Admission: RE | Admit: 2022-01-03 | Discharge: 2022-01-03 | Disposition: A | Discharge status: Home / Self Care | Payer: Medicare PPO | Source: Ambulatory Visit | Attending: Physician Assistant | Admitting: Physician Assistant

## 2022-01-03 VITALS — BP 100/70 | HR 85 | Ht 70.0 in | Wt 259.4 lb

## 2022-01-03 DIAGNOSIS — I5032 Chronic diastolic (congestive) heart failure: Secondary | ICD-10-CM | POA: Insufficient documentation

## 2022-01-03 DIAGNOSIS — Z6837 Body mass index (BMI) 37.0-37.9, adult: Secondary | ICD-10-CM | POA: Diagnosis not present

## 2022-01-03 DIAGNOSIS — Z7901 Long term (current) use of anticoagulants: Secondary | ICD-10-CM | POA: Diagnosis not present

## 2022-01-03 DIAGNOSIS — E785 Hyperlipidemia, unspecified: Secondary | ICD-10-CM | POA: Insufficient documentation

## 2022-01-03 DIAGNOSIS — D6869 Other thrombophilia: Secondary | ICD-10-CM | POA: Diagnosis not present

## 2022-01-03 DIAGNOSIS — I2510 Atherosclerotic heart disease of native coronary artery without angina pectoris: Secondary | ICD-10-CM | POA: Diagnosis not present

## 2022-01-03 DIAGNOSIS — I480 Paroxysmal atrial fibrillation: Secondary | ICD-10-CM | POA: Diagnosis not present

## 2022-01-03 DIAGNOSIS — I110 Hypertensive heart disease with heart failure: Secondary | ICD-10-CM | POA: Insufficient documentation

## 2022-01-03 DIAGNOSIS — G4733 Obstructive sleep apnea (adult) (pediatric): Secondary | ICD-10-CM | POA: Insufficient documentation

## 2022-01-03 DIAGNOSIS — E119 Type 2 diabetes mellitus without complications: Secondary | ICD-10-CM | POA: Diagnosis not present
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
## 2022-01-13 ENCOUNTER — Ambulatory Visit (HOSPITAL_COMMUNITY)
Admission: RE | Admit: 2022-01-13 | Discharge: 2022-01-13 | Disposition: A | Discharge status: Home / Self Care | Payer: Medicare PPO | Source: Ambulatory Visit | Attending: Internal Medicine | Admitting: Internal Medicine

## 2022-01-13 ENCOUNTER — Encounter (HOSPITAL_COMMUNITY): Payer: Self-pay | Admitting: Internal Medicine

## 2022-01-13 VITALS — BP 98/68 | HR 86 | Wt 257.4 lb

## 2022-01-13 DIAGNOSIS — I480 Paroxysmal atrial fibrillation: Secondary | ICD-10-CM | POA: Diagnosis not present

## 2022-01-13 DIAGNOSIS — I2510 Atherosclerotic heart disease of native coronary artery without angina pectoris: Secondary | ICD-10-CM | POA: Diagnosis not present

## 2022-01-13 DIAGNOSIS — Z7984 Long term (current) use of oral hypoglycemic drugs: Secondary | ICD-10-CM | POA: Insufficient documentation

## 2022-01-13 DIAGNOSIS — I471 Supraventricular tachycardia, unspecified: Secondary | ICD-10-CM | POA: Insufficient documentation

## 2022-01-13 DIAGNOSIS — E1122 Type 2 diabetes mellitus with diabetic chronic kidney disease: Secondary | ICD-10-CM | POA: Diagnosis not present

## 2022-01-13 DIAGNOSIS — I10 Essential (primary) hypertension: Secondary | ICD-10-CM

## 2022-01-13 DIAGNOSIS — Z79899 Other long term (current) drug therapy: Secondary | ICD-10-CM | POA: Diagnosis not present

## 2022-01-13 DIAGNOSIS — R0683 Snoring: Secondary | ICD-10-CM | POA: Insufficient documentation

## 2022-01-13 DIAGNOSIS — I5032 Chronic diastolic (congestive) heart failure: Secondary | ICD-10-CM

## 2022-01-13 DIAGNOSIS — Z8249 Family history of ischemic heart disease and other diseases of the circulatory system: Secondary | ICD-10-CM | POA: Diagnosis not present

## 2022-01-13 DIAGNOSIS — Z7901 Long term (current) use of anticoagulants: Secondary | ICD-10-CM | POA: Insufficient documentation

## 2022-01-13 DIAGNOSIS — I252 Old myocardial infarction: Secondary | ICD-10-CM | POA: Diagnosis not present

## 2022-01-13 DIAGNOSIS — I130 Hypertensive heart and chronic kidney disease with heart failure and stage 1 through stage 4 chronic kidney disease, or unspecified chronic kidney disease: Secondary | ICD-10-CM | POA: Diagnosis not present

## 2022-01-13 DIAGNOSIS — I5033 Acute on chronic diastolic (congestive) heart failure: Secondary | ICD-10-CM | POA: Insufficient documentation

## 2022-01-13 DIAGNOSIS — N1832 Chronic kidney disease, stage 3b: Secondary | ICD-10-CM | POA: Diagnosis not present

## 2022-01-13 DIAGNOSIS — Z6836 Body mass index (BMI) 36.0-36.9, adult: Secondary | ICD-10-CM | POA: Insufficient documentation

## 2022-01-13 LAB — CBC
HCT: 46.3 % — ABNORMAL HIGH (ref 36.0–46.0)
Hemoglobin: 15.9 g/dL — ABNORMAL HIGH (ref 12.0–15.0)
MCH: 30.5 pg (ref 26.0–34.0)
MCHC: 34.3 g/dL (ref 30.0–36.0)
MCV: 88.9 fL (ref 80.0–100.0)
Platelets: 153 10*3/uL (ref 150–400)
RBC: 5.21 MIL/uL — ABNORMAL HIGH (ref 3.87–5.11)
RDW: 14.3 % (ref 11.5–15.5)
WBC: 6.1 10*3/uL (ref 4.0–10.5)
nRBC: 0 % (ref 0.0–0.2)

## 2022-01-13 LAB — BASIC METABOLIC PANEL
Anion gap: 13 (ref 5–15)
BUN: 40 mg/dL — ABNORMAL HIGH (ref 8–23)
CO2: 27 mmol/L (ref 22–32)
Calcium: 9.8 mg/dL (ref 8.9–10.3)
Chloride: 99 mmol/L (ref 98–111)
Creatinine, Ser: 1.74 mg/dL — ABNORMAL HIGH (ref 0.44–1.00)
GFR, Estimated: 32 mL/min — ABNORMAL LOW (ref >60)
Glucose, Bld: 90 mg/dL (ref 70–99)
Potassium: 4.4 mmol/L (ref 3.5–5.1)
Sodium: 139 mmol/L (ref 135–145)

## 2022-01-13 LAB — BRAIN NATRIURETIC PEPTIDE: B Natriuretic Peptide: 159 pg/mL — ABNORMAL HIGH (ref 0.0–100.0)

## 2022-01-13 NOTE — Progress Notes (Signed)
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 Sleep study 11/2013 was negative.     NSTEMI in 2/09 -> cath with occlusion of septal perforator. Otherwise normal arteries.  Admitted 8/16 for Afib RVR. She converted to sinus tach with dilt drip and was transitioned to Toprol-XL 25 mg daily.   LHC 12/17 with widely patent coronary arteries. LVEF 55%. Mildly elevated LVEDP.   Admitted 2/21 for atrial fibrillation w/ RVR in the setting of CAP and also w/ a/c diastolic HF. Echo EF 60-65%. [name] course also notable for AKI. SCr rose to 2.09 during admit but improved down to 1.46 on d/c. D/c wt 330 lb.  Zio patch 3/22 - 3% AF burden.  Echo 1/23 showed EF 55-60%, grade I DD, mildly reduced RV, mild to moderate TR, mild AI.  Zio 5/23 - AF burden 15%. 3 runs SVT  Underwent AF ablation 12/06/21  Here for f/u. Continues to go to [name] every day. Doing elliptical bike for 30 mins and doing weights. Feels good. Breathing ok. Occasional edema and will take extra lasix (about 2x/month). No orthopnea or PND. No palpitations. Had an episode of CP in November - took NTG and resolved. No CP with exercise. No problems with Xarelto.    Past Medical History:  Diagnosis Date   Anemia    Anxiety    Asthma    Atrial fibrillation ([name])    Bipolar disorder (Kathleen [name])    CAD (coronary artery disease)    Cancer (HCC)    basal cell carcinoma on right hand, papilloma left breast   CHF (congestive heart failure) ([place])    pt seen at  heart/vascular [name] 08/14/2014    Chronic kidney disease    Coarse tremors    hands   Coronary artery disease    Depression    DVT (deep vein thrombosis) in pregnancy    pt. 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[place]) 02/2007   non-q-wave with second septal perforator   Hyperlipidemia    Hypertension    Hypothyroidism    history of took medication, has resolved   Knee pain, left    Lower extremity deep venous thrombosis (HCC)    20 years ago    Measles    hx of in childhood    Morbid obesity with BMI of 40.0-44.9, adult ([name])    Pain at surgical incision    Left breast from procedure 09/11/16   Pneumonia    hx of    PONV (postoperative nausea and vomiting)    also slow to wake up   Psychiatric hospitalization 05/2008   Shingles    20 years ago    Shortness of breath dyspnea    exercise    Type 2 diabetes mellitus ([place]) 12/31/2015   Urinary incontinence  UTI (urinary tract infection)    Past Surgical History:  Procedure Laterality Date   ATRIAL FIBRILLATION ABLATION N/A 12/06/2021   Procedure: ATRIAL FIBRILLATION ABLATION;  Surgeon: Constance Haw, MD;  Location: [facility];  Service: Cardiovascular;  Laterality: N/A;   BASAL CELL CARCINOMA EXCISION     BREAST EXCISIONAL BIOPSY Left 2018   BREAST LUMPECTOMY WITH RADIOACTIVE SEED LOCALIZATION Left 09/11/2016   Procedure: LEFT BREAST LUMPECTOMY WITH RADIOACTIVE SEED LOCALIZATION;  Surgeon: Excell Seltzer, MD;  Location: [facility];  Service: General;  Laterality: Left;   BREAST MASS EXCISION     age 89, benign tumor   CARDIAC CATHETERIZATION     CARDIAC CATHETERIZATION N/A 12/30/2015   Procedure: Left Heart Cath and Coronary Angiography;  Surgeon: Belva Crome, MD;  Location: [facility];  Service: Cardiovascular;  Laterality: N/A;   COLONOSCOPY  07/15/2009   [facility]    DILATATION & CURETTAGE/HYSTEROSCOPY WITH MYOSURE N/A 09/22/2016   Procedure: DILATATION & CURETTAGE/HYSTEROSCOPY WITH MYOSURE;  Surgeon: Molli Posey, MD;  Location: [facility];  Service: Gynecology;  Laterality: N/A;   DILATION AND CURETTAGE OF UTERUS     KNEE SURGERY     right, removed cartilage   PARATHYROIDECTOMY N/A 08/25/2014   Procedure: PARATHYROIDECTOMY;  Surgeon: Armandina Gemma, MD;  Location: [facility];  Service: General;  Laterality: N/A;   SHOULDER ARTHROSCOPY WITH ROTATOR CUFF REPAIR AND SUBACROMIAL DECOMPRESSION Right 02/27/2017   Procedure: Right shoulder arthroscopic rotator cuff repair with biceps tenodesis and subacromial decompression;  Surgeon: Nicholes Stairs, MD;  Location: [facility];  Service: Orthopedics;  Laterality: Right;  150 mins   TONSILLECTOMY     TUBAL LIGATION     Current Outpatient Medications  Medication Sig Dispense Refill   Accu-Chek Softclix Lancets lancets Use as instructed E11.9 100 each 12   allopurinol (ZYLOPRIM) 100 MG tablet TAKE TWO TABLETS BY MOUTH DAILY 180 tablet 1   benztropine (COGENTIN) 1 MG tablet Take 1 mg by mouth 2 (two) times daily.  2   Biotin w/ Vitamins C & E (HAIR/SKIN/NAILS PO) Take 1 tablet by mouth daily.     Blood Glucose Calibration (ACCU-CHEK GUIDE CONTROL) LIQD 1 drop by In Vitro route as directed. E11.9 1 each 1   Blood Glucose Monitoring Suppl (ACCU-CHEK GUIDE ME) w/Device KIT 1 each by Does not apply route in the morning and at bedtime. E11.9 1 kit 0   calcitRIOL (ROCALTROL) 0.25 MCG capsule TAKE ONE CAPSULE BY MOUTH DAILY 90 capsule 1   Cholecalciferol (VITAMIN D3) 50 MCG (2000 UT) capsule Take 2,000 Units by mouth daily.     divalproex (DEPAKOTE ER) 500 MG 24 hr tablet Take 1,000 mg by mouth at bedtime.     empagliflozin (JARDIANCE) 10 MG TABS tablet Take 1 tablet (10 mg total) by mouth daily. 90 tablet 3   furosemide (LASIX) 80 MG tablet Take 0.5 tablets (40 mg total) by mouth 2 (two) times daily. Can take an additional  74m as needed for weight gain of 3 pounds for more in 24 hours 135 tablet 3   glucose blood (ACCU-CHEK GUIDE) test strip Use as instructed 100 each 12   LATUDA 60 MG TABS Take 60 mg by mouth at bedtime.      levalbuterol (XOPENEX HFA) 45 MCG/ACT inhaler Inhale 2 puffs into the lungs every 4 (four) hours as needed for wheezing. 1 Inhaler 1   levothyroxine (SYNTHROID) 50 MCG tablet Take 1 tablet (50 mcg total) by mouth daily before  breakfast. 90 tablet 0   metFORMIN (GLUCOPHAGE) 500 MG tablet TAKE ONE TABLET BY MOUTH TWICE A DAY WITH MEALS 180 tablet 1   metoprolol tartrate (LOPRESSOR) 25 MG tablet Take 25 mg by mouth 2 (two) times daily.     nitroGLYCERIN (NITROSTAT) 0.4 MG SL tablet Place 1 tablet (0.4 mg total) under the tongue every 5 (five) minutes as needed for chest pain. For chest pain 25 tablet 3   NON FORMULARY Place 1 drop into both eyes 2 (two) times daily. Regener-Eyes eye drops     OLANZapine (ZYPREXA) 15 MG tablet Take 15 mg by mouth at bedtime.     prednisoLONE acetate (PRED FORTE) 1 % ophthalmic suspension Place 1 drop into both eyes See admin instructions. Instill 1 drop into both eyes 4 times daily for 2 weeks, then 2 times daily for 4 weeks     rosuvastatin (CRESTOR) 20 MG tablet TAKE ONE TABLET BY MOUTH DAILY 90 tablet 2   sacubitril-valsartan (ENTRESTO) 24-26 MG Take 1 tablet by mouth 2 (two) times daily. 60 tablet 11   tirzepatide (MOUNJARO) 7.5 MG/0.5ML Pen Inject 7.5 mg into the skin once a week. 6 mL 1   XARELTO 20 MG TABS tablet TAKE ONE TABLET BY MOUTH AT BEDTIME 90 tablet 3   No current facility-administered medications for this encounter.    Allergies:   Tetanus-diphtheria toxoids td, Influenza vaccines, Tetanus toxoids, and Lamictal [lamotrigine]   Social History:  The patient  reports that she has never smoked. She has never used smokeless tobacco. She reports current alcohol use. She reports that she does not use drugs.   Family History:  The patient's family  history includes Alcohol abuse in her father, maternal grandfather, maternal grandmother, mother, paternal grandfather, and paternal grandmother; Alzheimer's disease in her father and mother; Breast cancer (age of onset: 47) in her mother; Diabetes in her brother; Drug abuse in her maternal grandfather; Hypertension in her brother; Lung cancer in her mother; Parkinson's disease in her father; Schizophrenia in her cousin.   ROS:  Please see the history of present illness.   All other systems are personally reviewed and negative.   Vitals:   01/13/22 1357 01/13/22 1422  BP: (!) 90/50 98/68  Pulse: 86   SpO2: 97%   Weight: 116.8 kg (257 lb 6.4 oz)     Wt Readings from Last 3 Encounters:  01/13/22 116.8 kg (257 lb 6.4 oz)  01/03/22 117.7 kg (259 lb 6.4 oz)  12/12/21 119.3 kg (263 lb)    Recent Labs: 02/15/2021: B Natriuretic Peptide 75.7 05/26/2021: ALT 18; TSH 1.92 11/22/2021: BUN 29; Creatinine, Ser 1.48; Hemoglobin 15.7; Platelets 147; Potassium 4.6; Sodium 141  Personally reviewed   Physical Exam General:  Well appearing. No resp difficulty HEENT: normal Neck: supple. no JVD. Carotids 2+ bilat; no bruits. No lymphadenopathy or thryomegaly appreciated. Cor: PMI nondisplaced. Regular rate & rhythm. No rubs, gallops or murmurs. Lungs: clear Abdomen: obese soft, nontender, nondistended. No hepatosplenomegaly. No bruits or masses. Good bowel sounds. Extremities: no cyanosis, clubbing, rash, edema Neuro: alert & orientedx3, cranial nerves grossly intact. moves all 4 extremities w/o difficulty. Affect pleasant   ECG: NSR 86 No ST-T wave abnormalities. Personally reviewed   ASSESSMENT AND PLAN:  1. Chronic diastolic HF:  - Echo 09/4852 EF Normal EF 55-60% Grade IDD  - Echo 7/19 EF 55-60%  - Echo 2/21 EF 60-65%, RV ok  - Echo 1/23 EF 60-65%, RV ok. - Stable NYHA II Volume looks good  today. Doing well with sliding scale lasix - Will stop Entresto 24/26 bid due to low BP - Continue  Lasix 80 mg bid.  - Continue Jardiance 10 mg daily.  - Continue metoprolol 25 mg bid. - Check labs  2. PAF - Zio patch 3/22 - 3% AF burden - Continue metoprolol - CHADSVASC = 5 - Continue Xarelto. No bleeding - Repeat Zio 5/23 - AF burden 15% (average rate 128 in AF. 81 when in sinus). 3 runs SVT - Underwent AF ablation 12/06/21 -> in NSR  3. CAD:  - No s/s angina - No ASA w/ Xarelto.  - Continue statin.     4. HTN:   - BP low -> stop entresto  5. DM2 - Continue Jardiance 10 mg daily. - Continue metformin.  - HgbA1c 6.1 (3/23) - Refer for GLP1RA to help with weight loss  7. CKD 3b - Follows with Dr. Posey Pronto. - Continue SGLT2i. - Labs today  8. Morbid obesity: - Body mass index is 36.93 kg/m. - Continue with exercises at [name]. - Has lost 35 pounds with Mounjaro   9. Snoring:  - No OSA on previous sleep study in 2015    Glori Bickers, MD  01/13/2022 2:23 PM  [address] [address] [phone] (office) [phone] (fax)

## 2022-01-13 NOTE — Patient Instructions (Signed)
Medication Changes:  STOP Entresto  Lab Work:  Labs done today, your results will be available in MyChart, we will contact you for abnormal readings.   Testing/Procedures:  none  Referrals:  none  Special Instructions // Education:  Do the following things EVERYDAY: Weigh yourself in the morning before breakfast. Write it down and keep it in a log. Take your medicines as prescribed Eat low salt foods--Limit salt (sodium) to 2000 mg per day.  Stay as active as you can everyday Limit all fluids for the day to less than 2 liters   Follow-Up in: 6 months  At the [facility], you and your health needs are our priority. We have a designated team specialized in the treatment of Heart Failure. This Care Team includes your primary Heart Failure Specialized Cardiologist (physician), Advanced Practice Providers (APPs- Physician Assistants and Nurse Practitioners), and Pharmacist who all work together to provide you with the care you need, when you need it.   You may see any of the following providers on your designated Care Team at your next follow up:  Dr. Glori Bickers Dr. Loralie Champagne Dr. Roxana Hires, NP Lyda Jester, [place] [facility] [place], [place] Forestine Na, NP Audry Riles, PharmD   Please be sure to bring in all your medications bottles to every appointment.   Need to Contact us:  If you have any questions or concerns before your next appointment please send [place] a message through [place] or call our office at [phone].    TO LEAVE A MESSAGE FOR THE NURSE SELECT OPTION 2, PLEASE LEAVE A MESSAGE INCLUDING: YOUR NAME DATE OF BIRTH CALL BACK NUMBER REASON FOR CALL**this is important as we prioritize the call backs  YOU WILL RECEIVE A CALL BACK THE SAME DAY AS LONG AS YOU CALL BEFORE 4:00 PM

## 2022-01-13 NOTE — Addendum Note (Signed)
Encounter addended by: Scarlette Calico, RN on: 01/13/2022 2:31 PM  Actions taken: Diagnosis association updated, Order list changed, Clinical Note Signed, Charge Capture section accepted

## 2022-01-20 DIAGNOSIS — N183 Chronic kidney disease, stage 3 unspecified: Secondary | ICD-10-CM | POA: Diagnosis not present

## 2022-01-24 DIAGNOSIS — H16223 Keratoconjunctivitis sicca, not specified as Sjogren's, bilateral: Secondary | ICD-10-CM | POA: Diagnosis not present

## 2022-01-24 DIAGNOSIS — H0288A Meibomian gland dysfunction right eye, upper and lower eyelids: Secondary | ICD-10-CM | POA: Diagnosis not present

## 2022-01-24 DIAGNOSIS — H0288B Meibomian gland dysfunction left eye, upper and lower eyelids: Secondary | ICD-10-CM | POA: Diagnosis not present

## 2022-01-24 DIAGNOSIS — H16143 Punctate keratitis, bilateral: Secondary | ICD-10-CM | POA: Diagnosis not present

## 2022-01-30 ENCOUNTER — Emergency Department (HOSPITAL_COMMUNITY): Payer: Medicare PPO

## 2022-01-30 ENCOUNTER — Emergency Department (HOSPITAL_COMMUNITY)
Admission: EM | Admit: 2022-01-30 | Discharge: 2022-01-31 | Disposition: A | Discharge status: Home / Self Care | Payer: Medicare PPO | Attending: Emergency Medicine | Admitting: Emergency Medicine

## 2022-01-30 ENCOUNTER — Other Ambulatory Visit: Payer: Self-pay

## 2022-01-30 DIAGNOSIS — I4891 Unspecified atrial fibrillation: Secondary | ICD-10-CM | POA: Diagnosis not present

## 2022-01-30 DIAGNOSIS — R0602 Shortness of breath: Secondary | ICD-10-CM | POA: Insufficient documentation

## 2022-01-30 DIAGNOSIS — I2510 Atherosclerotic heart disease of native coronary artery without angina pectoris: Secondary | ICD-10-CM | POA: Insufficient documentation

## 2022-01-30 DIAGNOSIS — R0789 Other chest pain: Secondary | ICD-10-CM | POA: Diagnosis not present

## 2022-01-30 DIAGNOSIS — Z7984 Long term (current) use of oral hypoglycemic drugs: Secondary | ICD-10-CM | POA: Diagnosis not present

## 2022-01-30 DIAGNOSIS — R2243 Localized swelling, mass and lump, lower limb, bilateral: Secondary | ICD-10-CM | POA: Insufficient documentation

## 2022-01-30 DIAGNOSIS — I499 Cardiac arrhythmia, unspecified: Secondary | ICD-10-CM | POA: Diagnosis not present

## 2022-01-30 DIAGNOSIS — R002 Palpitations: Secondary | ICD-10-CM | POA: Diagnosis not present

## 2022-01-30 DIAGNOSIS — Z7901 Long term (current) use of anticoagulants: Secondary | ICD-10-CM | POA: Diagnosis not present

## 2022-01-30 DIAGNOSIS — R000 Tachycardia, unspecified: Secondary | ICD-10-CM | POA: Diagnosis not present

## 2022-01-30 DIAGNOSIS — R079 Chest pain, unspecified: Secondary | ICD-10-CM | POA: Diagnosis not present

## 2022-01-30 LAB — CBC
HCT: 42 % (ref 36.0–46.0)
Hemoglobin: 14.9 g/dL (ref 12.0–15.0)
MCH: 30.8 pg (ref 26.0–34.0)
MCHC: 35.5 g/dL (ref 30.0–36.0)
MCV: 86.8 fL (ref 80.0–100.0)
Platelets: 125 10*3/uL — ABNORMAL LOW (ref 150–400)
RBC: 4.84 MIL/uL (ref 3.87–5.11)
RDW: 14.6 % (ref 11.5–15.5)
WBC: 7.4 10*3/uL (ref 4.0–10.5)
nRBC: 0 % (ref 0.0–0.2)

## 2022-01-30 MED ORDER — METOPROLOL TARTRATE 5 MG/5ML IV SOLN
5.0000 mg | Freq: Once | INTRAVENOUS | Status: AC
  Administered 2022-01-30: 5 mg via INTRAVENOUS
  Filled 2022-01-30: qty 5

## 2022-01-30 MED ORDER — ETOMIDATE 2 MG/ML IV SOLN
10.0000 mg | Freq: Once | INTRAVENOUS | Status: AC
  Administered 2022-01-31: 6 mg via INTRAVENOUS
  Filled 2022-01-30: qty 10

## 2022-01-30 MED ORDER — RIVAROXABAN 10 MG PO TABS
20.0000 mg | ORAL_TABLET | Freq: Once | ORAL | Status: AC
  Administered 2022-01-30: 20 mg via ORAL
  Filled 2022-01-30: qty 2

## 2022-01-30 NOTE — ED Notes (Signed)
Placed Kathleen [name] on PT

## 2022-01-30 NOTE — ED Provider Notes (Signed)
[facility] EMERGENCY DEPARTMENT Provider Note   CSN: [id number] Arrival date & time: 01/30/22  2250     History {Add pertinent medical, surgical, social history, OB history to HPI:1} Chief Complaint  Patient presents with   Palpitations    Kathleen [name] is a 67 y.o. female.  HPI     This is a 67 year old with history of coronary artery disease, atrial fibrillation on Xarelto status post ablation in November who presents with palpitations and shortness of breath.  Patient reports onset of symptoms around 8 PM.  States that she felt very funny and had significant palpitations.  She states that since her ablation she had not had any issues.  She is compliant with her Xarelto and has not missed any doses in the last 30 days but did not take her nighttime dose tonight because she called the ambulance.  She is having some chest discomfort.  She states that this is different from when she had her heart attack.  Denies lower extremity swelling but does endorse some shortness of breath.  Home Medications Prior to Admission medications   Medication Sig Start Date End Date Taking? Authorizing Provider  Accu-Chek Softclix Lancets lancets Use as instructed E11.9 06/07/21   [name], [name], MD  allopurinol (ZYLOPRIM) 100 MG tablet TAKE TWO TABLETS BY MOUTH DAILY 03/14/21   [name], [name], MD  benztropine (COGENTIN) 1 MG tablet Take 1 mg by mouth 2 (two) times daily. 01/14/17   [provider]  Biotin w/ Vitamins C & E (HAIR/SKIN/NAILS PO) Take 1 tablet by mouth daily.    [provider]  Blood Glucose Calibration (ACCU-CHEK GUIDE CONTROL) LIQD 1 drop by In Vitro route as directed. E11.9 06/07/21   [name], [name], MD  Blood Glucose Monitoring Suppl (ACCU-CHEK GUIDE ME) w/Device KIT 1 each by Does not apply route in the morning and at bedtime. E11.9 11/29/21   [name], [name], MD  calcitRIOL (ROCALTROL) 0.25 MCG capsule TAKE ONE CAPSULE BY MOUTH DAILY  10/03/21   [name], [name], MD  Cholecalciferol (VITAMIN D3) 50 MCG (2000 UT) capsule Take 2,000 Units by mouth daily.    [provider]  divalproex (DEPAKOTE ER) 500 MG 24 hr tablet Take 1,000 mg by mouth at bedtime.    [provider]  empagliflozin (JARDIANCE) 10 MG TABS tablet Take 1 tablet (10 mg total) by mouth daily. 12/20/20   Bensimhon, Shaune Pascal, MD  furosemide (LASIX) 80 MG tablet Take 0.5 tablets (40 mg total) by mouth 2 (two) times daily. Can take an additional '40mg'$  as needed for weight gain of 3 pounds for more in 24 hours 01/05/21   Joette Catching, PA-C  glucose blood (ACCU-CHEK GUIDE) test strip Use as instructed 06/07/21   [name], [name], MD  LATUDA 60 MG TABS Take 60 mg by mouth at bedtime.  03/08/16   [provider]  levalbuterol Penne Lash HFA) 45 MCG/ACT inhaler Inhale 2 puffs into the lungs every 4 (four) hours as needed for wheezing. 03/04/19 01/14/23  Eugenie Filler, MD  levothyroxine (SYNTHROID) 50 MCG tablet Take 1 tablet (50 mcg total) by mouth daily before breakfast. 11/03/21   [name], [name], MD  metFORMIN (GLUCOPHAGE) 500 MG tablet TAKE ONE TABLET BY MOUTH TWICE A DAY WITH MEALS 07/13/21   [name], [name], MD  metoprolol tartrate (LOPRESSOR) 25 MG tablet Take 25 mg by mouth 2 (two) times daily.    [provider]  nitroGLYCERIN (NITROSTAT) 0.4  MG SL tablet Place 1 tablet (0.4 mg total) under the tongue every 5 (five) minutes as needed for chest pain. For chest pain 12/07/11   Barrett, Evelene Croon, PA-C  NON FORMULARY Place 1 drop into both eyes 2 (two) times daily. Regener-Eyes eye drops    [provider]  OLANZapine (ZYPREXA) 15 MG tablet Take 15 mg by mouth at bedtime. 02/15/19   [provider]  prednisoLONE acetate (PRED FORTE) 1 % ophthalmic suspension Place 1 drop into both eyes See admin instructions. Instill 1 drop into both eyes 4 times daily for 2 weeks, then 2 times daily for 4 weeks 11/16/21    [provider]  rosuvastatin (CRESTOR) 20 MG tablet TAKE ONE TABLET BY MOUTH DAILY 10/03/21   [name], [name], MD  tirzepatide [facility]) 7.5 MG/0.5ML Pen Inject 7.5 mg into the skin once a week. 09/28/21   [name], [name], MD  XARELTO 20 MG TABS tablet TAKE ONE TABLET BY MOUTH AT BEDTIME 10/03/21   Bensimhon, Shaune Pascal, MD      Allergies    Tetanus-diphtheria toxoids td, Influenza vaccines, Tetanus toxoids, and Lamictal [lamotrigine]    Review of Systems   Review of Systems  Constitutional:  Negative for fever.  Respiratory:  Positive for shortness of breath.   Cardiovascular:  Positive for chest pain and palpitations.  All other systems reviewed and are negative.   Physical Exam Updated Vital Signs Ht 1.778 m ('5\' 10"'$ )   Wt 116.1 kg   BMI 36.73 kg/m  Physical Exam Vitals and nursing note reviewed.  Constitutional:      Appearance: She is well-developed. She is obese. She is not ill-appearing.  HENT:     Head: Normocephalic and atraumatic.  Eyes:     Pupils: Pupils are equal, round, and reactive to light.  Cardiovascular:     Rate and Rhythm: Tachycardia present. Rhythm irregular.     Heart sounds: Normal heart sounds.  Pulmonary:     Effort: Pulmonary effort is normal. No respiratory distress.     Breath sounds: No wheezing.  Abdominal:     General: Bowel sounds are normal.     Palpations: Abdomen is soft.  Musculoskeletal:     Cervical back: Neck supple.     Right lower leg: Edema present.     Left lower leg: Edema present.     Comments: Trace bilateral lower extremity edema  Skin:    General: Skin is warm and dry.     Comments: Venous stasis changes bilateral lower extremities  Neurological:     Mental Status: She is alert and oriented to person, place, and time.  Psychiatric:        Mood and Affect: Mood normal.     ED Results / Procedures / Treatments   Labs (all labs ordered are listed, but only abnormal results are displayed) Labs Reviewed   BASIC METABOLIC PANEL  MAGNESIUM  CBC  BRAIN NATRIURETIC PEPTIDE  TROPONIN I (HIGH SENSITIVITY)    EKG None  Radiology No results found.  Procedures Procedures  {Document cardiac monitor, telemetry assessment procedure when appropriate:1}  Medications Ordered in ED Medications  rivaroxaban (XARELTO) tablet 20 mg (has no administration in time range)  metoprolol tartrate (LOPRESSOR) injection 5 mg (has no administration in time range)    ED Course/ Medical Decision Making/ A&P                           Medical  Decision Making Amount and/or Complexity of Data Reviewed Labs: ordered. Radiology: ordered.  Risk Prescription drug management.   ***  {Document critical care time when appropriate:1} {Document review of labs and clinical decision tools ie heart score, Chads2Vasc2 etc:1}  {Document your independent review of radiology images, and any outside records:1} {Document your discussion with family members, caretakers, and with consultants:1} {Document social determinants of health affecting pt's care:1} {Document your decision making why or why not admission, treatments were needed:1} Final Clinical Impression(s) / ED Diagnoses Final diagnoses:  None    Rx / DC Orders ED Discharge Orders          Ordered    Amb referral to AFIB Clinic        01/30/22 2320

## 2022-01-30 NOTE — ED Triage Notes (Signed)
Pt bib [facility] c/o chest pain and dizzziness that started around 2000. Hx CHF and afib, currently in afib HR 130-160, ablation in November, pt reports similar symptoms then. 324 ASA and 500 NS bolus given PTA.   BP 150/82 then 92/52 HR 130-160 afib SPO2 93% RA RR 17 CBG 119

## 2022-01-31 LAB — TROPONIN I (HIGH SENSITIVITY)
Troponin I (High Sensitivity): 4 ng/L (ref <18)
Troponin I (High Sensitivity): 5 ng/L (ref <18)

## 2022-01-31 LAB — BASIC METABOLIC PANEL
Anion gap: 13 (ref 5–15)
BUN: 28 mg/dL — ABNORMAL HIGH (ref 8–23)
CO2: 25 mmol/L (ref 22–32)
Calcium: 9.5 mg/dL (ref 8.9–10.3)
Chloride: 100 mmol/L (ref 98–111)
Creatinine, Ser: 1.46 mg/dL — ABNORMAL HIGH (ref 0.44–1.00)
GFR, Estimated: 39 mL/min — ABNORMAL LOW (ref >60)
Glucose, Bld: 102 mg/dL — ABNORMAL HIGH (ref 70–99)
Potassium: 3.7 mmol/L (ref 3.5–5.1)
Sodium: 138 mmol/L (ref 135–145)

## 2022-01-31 LAB — BRAIN NATRIURETIC PEPTIDE: B Natriuretic Peptide: 54.6 pg/mL (ref 0.0–100.0)

## 2022-01-31 LAB — MAGNESIUM: Magnesium: 2.2 mg/dL (ref 1.7–2.4)

## 2022-01-31 NOTE — Sedation Documentation (Signed)
Pt cardioverted at 200J

## 2022-01-31 NOTE — ED Notes (Signed)
RN reviewed discharge instructions with pt. Pt verbalized understanding and had no further questions. VSS upon discharge.  

## 2022-01-31 NOTE — Discharge Instructions (Addendum)
You were seen today for palpitations and chest discomfort.  You were in atrial fibrillation with RVR.  You were cardioverted.  Continue your Xarelto.  Follow-up with Dr. Curt Bears in cardiology.  If you have any new or worsening symptoms, you should be reevaluated.

## 2022-02-01 ENCOUNTER — Ambulatory Visit: Payer: Medicare PPO | Attending: Cardiology | Admitting: Cardiology

## 2022-02-01 ENCOUNTER — Encounter: Payer: Self-pay | Admitting: Cardiology

## 2022-02-01 VITALS — BP 104/64 | HR 84 | Ht 70.0 in | Wt 254.0 lb

## 2022-02-01 DIAGNOSIS — I4819 Other persistent atrial fibrillation: Secondary | ICD-10-CM | POA: Diagnosis not present

## 2022-02-01 DIAGNOSIS — I483 Typical atrial flutter: Secondary | ICD-10-CM

## 2022-02-01 DIAGNOSIS — D6869 Other thrombophilia: Secondary | ICD-10-CM | POA: Diagnosis not present

## 2022-02-01 MED ORDER — AMIODARONE HCL 200 MG PO TABS: 200.0000 mg | ORAL_TABLET | Freq: Every day | ORAL | 3 refills | Status: DC

## 2022-02-01 NOTE — Progress Notes (Signed)
Electrophysiology Office Note   Date:  02/01/2022   ID:  Kathleen [name], DOB 1955/07/16, MRN [id number]  PCP:  Darreld Mclean, MD  Cardiologist: [place] Primary Electrophysiologist:  Yasmine Kilbourne Meredith Leeds, MD    Chief Complaint: Atrial fibrillation   History of Present Illness: Kathleen [name] is a 67 y.o. female who is being seen today for the evaluation of atrial fibrillation at the request of [name], [name], MD. Presenting today for electrophysiology evaluation.  She has a history sniffer diastolic heart failure, hypertension, hyperlipidemia, coronary artery disease, obstructive sleep apnea, atrial fibrillation.  She was admitted in 2016 for rapid atrial fibrillation.  Again admitted in 2021.  She had a cardiac monitor placed May 2023 that showed a 15% atrial fibrillation burden.  She is now status post ablation 12/06/2021.  Today, she denies symptoms of palpitations, chest pain, shortness of breath, orthopnea, PND, lower extremity edema, claudication, dizziness, presyncope, syncope, bleeding, or neurologic sequela. The patient is tolerating medications without difficulties.  Due to his ago, she went to the atrial fibrillation after an episode of syncope.  When in the emergency room, she was found to be in atrial flutter.  She had a cardioversion in the emergency room.  She felt quite poorly when she was in atrial flutter, but her symptoms did improve after getting back into normal rhythm.   Past Medical History:  Diagnosis Date   Anemia    Anxiety    Asthma    Atrial fibrillation ([place])    Bipolar disorder ([place])    CAD (coronary artery disease)    Cancer (HCC)    basal cell carcinoma on right hand, papilloma left breast   CHF (congestive heart failure) ([place])    pt seen at heart/vascular spec clinic 08/14/2014    Chronic kidney disease    Coarse tremors    hands   Coronary artery disease    Depression    DVT (deep vein thrombosis) in pregnancy    pt. reports that  it was post knee surgery, not during pregnancy   Dyslipidemia    Fall    Fatty liver    Headache    migraines - stopped after menopause   Heart attack ([place]) 02/2007   non-q-wave with second septal perforator   Hyperlipidemia    Hypertension    Hypothyroidism    history of took medication, has resolved   Knee pain, left    Lower extremity deep venous thrombosis (HCC)    20 years ago    Measles    hx of in childhood    Morbid obesity with BMI of 40.0-44.9, adult ([place])    Pain at surgical incision    Left breast from procedure 09/11/16   Pneumonia    hx of    PONV (postoperative nausea and vomiting)    also slow to wake up   Psychiatric hospitalization 05/2008   Shingles    20 years ago    Shortness of breath dyspnea    exercise    Type 2 diabetes mellitus ([place]) 12/31/2015   Urinary incontinence    UTI (urinary tract infection)    Past Surgical History:  Procedure Laterality Date   ATRIAL FIBRILLATION ABLATION N/A 12/06/2021   Procedure: ATRIAL FIBRILLATION ABLATION;  Surgeon: Constance Haw, MD;  Location: [facility];  Service: Cardiovascular;  Laterality: N/A;   BASAL CELL CARCINOMA EXCISION     BREAST EXCISIONAL BIOPSY Left 2018   BREAST LUMPECTOMY WITH RADIOACTIVE SEED LOCALIZATION  Left 09/11/2016   Procedure: LEFT BREAST LUMPECTOMY WITH RADIOACTIVE SEED LOCALIZATION;  Surgeon: Excell Seltzer, MD;  Location: [facility];  Service: General;  Laterality: Left;   BREAST MASS EXCISION     age 67, benign tumor   CARDIAC CATHETERIZATION     CARDIAC CATHETERIZATION N/A 12/30/2015   Procedure: Left Heart Cath and Coronary Angiography;  Surgeon: Belva Crome, MD;  Location: [facility];  Service: Cardiovascular;  Laterality: N/A;   COLONOSCOPY  07/15/2009   [facility]   DILATATION & CURETTAGE/HYSTEROSCOPY WITH MYOSURE N/A 09/22/2016   Procedure: DILATATION & CURETTAGE/HYSTEROSCOPY WITH MYOSURE;  Surgeon: Molli Posey, MD;  Location: [facility];  Service:  Gynecology;  Laterality: N/A;   DILATION AND CURETTAGE OF UTERUS     KNEE SURGERY     right, removed cartilage   PARATHYROIDECTOMY N/A 08/25/2014   Procedure: PARATHYROIDECTOMY;  Surgeon: Armandina Gemma, MD;  Location: [facility];  Service: General;  Laterality: N/A;   SHOULDER ARTHROSCOPY WITH ROTATOR CUFF REPAIR AND SUBACROMIAL DECOMPRESSION Right 02/27/2017   Procedure: Right shoulder arthroscopic rotator cuff repair with biceps tenodesis and subacromial decompression;  Surgeon: Nicholes Stairs, MD;  Location: [facility];  Service: Orthopedics;  Laterality: Right;  150 mins   TONSILLECTOMY     TUBAL LIGATION       Current Outpatient Medications  Medication Sig Dispense Refill   Accu-Chek Softclix Lancets lancets Use as instructed E11.9 100 each 12   allopurinol (ZYLOPRIM) 100 MG tablet TAKE TWO TABLETS BY MOUTH DAILY 180 tablet 1   amiodarone (PACERONE) 200 MG tablet Take 1 tablet (200 mg total) by mouth daily. 30 tablet 3   benztropine (COGENTIN) 1 MG tablet Take 1 mg by mouth 2 (two) times daily.  2   Biotin w/ Vitamins C & E (HAIR/SKIN/NAILS PO) Take 1 tablet by mouth daily.     Blood Glucose Calibration (ACCU-CHEK GUIDE CONTROL) LIQD 1 drop by In Vitro route as directed. E11.9 1 each 1   Blood Glucose Monitoring Suppl (ACCU-CHEK GUIDE ME) w/Device KIT 1 each by Does not apply route in the morning and at bedtime. E11.9 1 kit 0   calcitRIOL (ROCALTROL) 0.25 MCG capsule TAKE ONE CAPSULE BY MOUTH DAILY 90 capsule 1   Cholecalciferol (VITAMIN D3) 50 MCG (2000 UT) capsule Take 2,000 Units by mouth daily.     divalproex (DEPAKOTE ER) 500 MG 24 hr tablet Take 1,000 mg by mouth at bedtime.     empagliflozin (JARDIANCE) 10 MG TABS tablet Take 1 tablet (10 mg total) by mouth daily. 90 tablet 3   furosemide (LASIX) 80 MG tablet Take 0.5 tablets (40 mg total) by mouth 2 (two) times daily. Can take an additional '40mg'$  as needed for weight gain of 3 pounds for more in 24 hours 135 tablet 3   glucose  blood (ACCU-CHEK GUIDE) test strip Use as instructed 100 each 12   LATUDA 60 MG TABS Take 60 mg by mouth at bedtime.      levalbuterol (XOPENEX HFA) 45 MCG/ACT inhaler Inhale 2 puffs into the lungs every 4 (four) hours as needed for wheezing. 1 Inhaler 1   levothyroxine (SYNTHROID) 50 MCG tablet Take 1 tablet (50 mcg total) by mouth daily before breakfast. 90 tablet 0   metFORMIN (GLUCOPHAGE) 500 MG tablet TAKE ONE TABLET BY MOUTH TWICE A DAY WITH MEALS 180 tablet 1   metoprolol tartrate (LOPRESSOR) 25 MG tablet Take 25 mg by mouth 2 (two) times daily.     nitroGLYCERIN (NITROSTAT)  0.4 MG SL tablet Place 1 tablet (0.4 mg total) under the tongue every 5 (five) minutes as needed for chest pain. For chest pain 25 tablet 3   NON FORMULARY Place 1 drop into both eyes 2 (two) times daily. Regener-Eyes eye drops     OLANZapine (ZYPREXA) 15 MG tablet Take 15 mg by mouth at bedtime.     prednisoLONE acetate (PRED FORTE) 1 % ophthalmic suspension Place 1 drop into both eyes See admin instructions. Instill 1 drop into both eyes 4 times daily for 2 weeks, then 2 times daily for 4 weeks     rosuvastatin (CRESTOR) 20 MG tablet TAKE ONE TABLET BY MOUTH DAILY 90 tablet 2   tirzepatide (MOUNJARO) 7.5 MG/0.5ML Pen Inject 7.5 mg into the skin once a week. 6 mL 1   XARELTO 20 MG TABS tablet TAKE ONE TABLET BY MOUTH AT BEDTIME 90 tablet 3   No current facility-administered medications for this visit.    Allergies:   Tetanus-diphtheria toxoids td, Influenza vaccines, Tetanus toxoids, and Lamictal [lamotrigine]   Social History:  The patient  reports that she has never smoked. She has never used smokeless tobacco. She reports current alcohol use. She reports that she does not use drugs.   Family History:  The patient's family history includes Alcohol abuse in her father, maternal grandfather, maternal grandmother, mother, paternal grandfather, and paternal grandmother; Alzheimer's disease in her father and mother;  Breast cancer (age of onset: 59) in her mother; Diabetes in her brother; Drug abuse in her maternal grandfather; Hypertension in her brother; Lung cancer in her mother; Parkinson's disease in her father; Schizophrenia in her cousin.   ROS:  Please see the history of present illness.   Otherwise, review of systems is positive for none.   All other systems are reviewed and negative.   PHYSICAL EXAM: VS:  BP 104/64   Pulse 84   Ht '5\' 10"'$  (1.778 m)   Wt 254 lb (115.2 kg)   SpO2 98%   BMI 36.45 kg/m  , BMI Body mass index is 36.45 kg/m. GEN: Well nourished, well developed, in no acute distress  HEENT: normal  Neck: no JVD, carotid bruits, or masses Cardiac: RRR; no murmurs, rubs, or gallops,no edema  Respiratory:  clear to auscultation bilaterally, normal work of breathing GI: soft, nontender, nondistended, + BS MS: no deformity or atrophy  Skin: warm and dry Neuro:  Strength and sensation are intact Psych: euthymic mood, full affect  EKG:  EKG is not ordered today. Personal review of the ekg ordered 01/30/22 shows atrial flutter  Recent Labs: 05/26/2021: ALT 18; TSH 1.92 01/30/2022: B Natriuretic Peptide 54.6; BUN 28; Creatinine, Ser 1.46; Hemoglobin 14.9; Magnesium 2.2; Platelets 125; Potassium 3.7; Sodium 138    Lipid Panel     Component Value Date/Time   CHOL 138 09/29/2020 1042   TRIG 124.0 09/29/2020 1042   HDL 44.60 09/29/2020 1042   CHOLHDL 3 09/29/2020 1042   VLDL 24.8 09/29/2020 1042   LDLCALC 69 09/29/2020 1042   LDLDIRECT 166.0 08/03/2016 0834     Wt Readings from Last 3 Encounters:  02/01/22 254 lb (115.2 kg)  01/30/22 256 lb (116.1 kg)  01/13/22 257 lb 6.4 oz (116.8 kg)      Other studies Reviewed: Additional studies/ records that were reviewed today include: TTE 02/15/21  Review of the above records today demonstrates:   1. Left ventricular ejection fraction, by estimation, is 55 to 60%. The  left ventricle has normal function.  The left ventricle has no  regional  wall motion abnormalities. Left ventricular diastolic parameters are  consistent with Grade I diastolic  dysfunction (impaired relaxation). The average left ventricular global  longitudinal strain is -21.6 %. The global longitudinal strain is normal.   2. Right ventricular systolic function is mildly reduced. The right  ventricular size is normal. There is normal pulmonary artery systolic  pressure.   3. The mitral valve is normal in structure. No evidence of mitral valve  regurgitation. No evidence of mitral stenosis.   4. Tricuspid valve regurgitation is mild to moderate.   5. The aortic valve is normal in structure. Aortic valve regurgitation is  mild. No aortic stenosis is present.   6. The inferior vena cava is normal in size with greater than 50%  respiratory variability, suggesting right atrial pressure of 3 mmHg.   Cardiac monitor 06/21/2021 personally reviewed 1. Sinus rhythm -  avg HR of 81 bpm. 2. Three runs of Supraventricular Tachycardia occurred, the run with the fastest interval lasting 22.4 secs with a max rate of 148 bpm (avg 111bpm); the run with the fastest interval was also the longest.  3. Atrial Fibrillation/Flutter occurred (15% burden), ranging from 72-201 bpm (avg of 128  bpm), the longest lasting 12 hours 1 min with an avg rate of 117 bpm.  4. Rare PACs and PVCs 5. Multiple patient-triggered events associated with sinus rhythm, isolated PACs, isolated PVCs and/or AF.   ASSESSMENT AND PLAN:  1.  Paroxysmal atrial fibrillation: CHA2DS2-VASc of 6.  Currently on Xarelto 20 mg daily.  Status post ablation 12/06/2021.  No obvious recurrence of atrial fibrillation.  Continue with current management.  2.  Secondary hypercoagulable state: Currently on Xarelto for atrial fibrillation as above  3.  Obstructive sleep apnea: Managed by sleep medicine  4.  Obesity: Lifestyle modification encouraged Body mass index is 36.45 kg/m.  5.  Diastolic heart failure:  Appears euvolemic.  Plan per heart failure cardiology  6.  Atrial flutter: Appears typical on ECG.  As she had syncope, I feel that this likely needs to be treated.  Have offered long-term medications versus ablation.  She would like to avoid medications.  Eeva Schlosser plan for ablation.  Jjesus Dingley start her on amiodarone 200 mg daily in an attempt to keep her in rhythm until ablation can be performed.  Current medicines are reviewed at length with the patient today.   The patient does not have concerns regarding her medicines.  The following changes were made today: Start amiodarone  Labs/ tests ordered today include:  Orders Placed This Encounter  Procedures   CT CARDIAC MORPH/PULM VEIN W/CM&W/O CA SCORE     Disposition:   FU with Yeira Gulden 3 months  Signed, Jayvier Burgher Meredith Leeds, MD  02/01/2022 2:26 PM     [name] [address] [place] [address] [phone] (office) [phone] (fax)

## 2022-02-01 NOTE — Patient Instructions (Addendum)
Medication Instructions:  Your physician has recommended you make the following change in your medication:  START Amiodarone  - take 1 tablet (200 mg total) ONCE a day  *If you need a refill on your cardiac medications before your next appointment, please call your pharmacy*   Lab Work: Pre procedure labs -- see procedure instruction letter:  BMP & CBC  If you have labs (blood work) drawn today and your tests are completely normal, you will receive your results only by: [facility] (if you have MyChart) OR A paper copy in the mail If you have any lab test that is abnormal or we need to change your treatment, we will call you to review the results.   Testing/Procedures: Your physician has requested that you have cardiac CT within 7 days PRIOR to your ablation. Cardiac computed tomography (CT) is a painless test that uses an x-ray machine to take clear, detailed pictures of your heart.  Please follow instruction below located under "other instructions". You will get a call from our office to schedule the date for this test.  Your physician has recommended that you have an ablation. Catheter ablation is a medical procedure used to treat some cardiac arrhythmias (irregular heartbeats). During catheter ablation, a long, thin, flexible tube is put into a blood vessel in your groin (upper thigh), or neck. This tube is called an ablation catheter. It is then guided to your heart through the blood vessel. Radio frequency waves destroy small areas of heart tissue where abnormal heartbeats may cause an arrhythmia to start.   06/13/2022 date held for this procedure.  We will send you instructions via mychart at a later date.    Follow-Up: At [facility], you and your health needs are our priority.  As part of our continuing mission to provide you with exceptional heart care, we have created designated Provider Care Teams.  These Care Teams include your primary Cardiologist (physician) and  Advanced Practice Providers (APPs -  Physician Assistants and Nurse Practitioners) who all work together to provide you with the care you need, when you need it.  We recommend signing up for the patient portal called "MyChart".  Sign up information is provided on this After Visit Summary.  MyChart is used to connect with patients for Virtual Visits (Telemedicine).  Patients are able to view lab/test results, encounter notes, upcoming appointments, etc.  Non-urgent messages can be sent to your provider as well.   To learn more about what you can do with MyChart, go to [email or web address].     Your physician recommends that you schedule a follow-up appointment in: 10-14 days for nurse visit EKG   Your next appointment:   1 month(s) after your ablation  The format for your next appointment:   In Person  Provider:   [facility] clinic   Thank you for choosing [facility]!!   Trinidad Curet, RN [phone]    Other Instructions   Cardiac Ablation Cardiac ablation is a procedure to destroy (ablate) some heart tissue that is sending bad signals. These bad signals cause problems in heart rhythm. The heart has many areas that make these signals. If there are problems in these areas, they can make the heart beat in a way that is not normal. Destroying some tissues can help make the heart rhythm normal. Tell your doctor about: Any allergies you have. All medicines you are taking. These include vitamins, herbs, eye drops, creams, and over-the-counter medicines. Any problems you or family members have  had with medicines that make you fall asleep (anesthetics). Any blood disorders you have. Any surgeries you have had. Any medical conditions you have, such as kidney failure. Whether you are pregnant or may be pregnant. What are the risks? This is a safe procedure. But problems may occur, including: Infection. Bruising and bleeding. Bleeding into the chest. Stroke or blood  clots. Damage to nearby areas of your body. Allergies to medicines or dyes. The need for a pacemaker if the normal system is damaged. Failure of the procedure to treat the problem. What happens before the procedure? Medicines Ask your doctor about: Changing or stopping your normal medicines. This is important. Taking aspirin and ibuprofen. Do not take these medicines unless your doctor tells you to take them. Taking other medicines, vitamins, herbs, and supplements. General instructions Follow instructions from your doctor about what you cannot eat or drink. Plan to have someone take you home from the hospital or clinic. If you will be going home right after the procedure, plan to have someone with you for 24 hours. Ask your doctor what steps will be taken to prevent infection. What happens during the procedure?  An IV tube will be put into one of your veins. You will be given a medicine to help you relax. The skin on your neck or groin will be numbed. A cut (incision) will be made in your neck or groin. A needle will be put through your cut and into a large vein. A tube (catheter) will be put into the needle. The tube will be moved to your heart. Dye may be put through the tube. This helps your doctor see your heart. Small devices (electrodes) on the tube will send out signals. A type of energy will be used to destroy some heart tissue. The tube will be taken out. Pressure will be held on your cut. This helps stop bleeding. A bandage will be put over your cut. The exact procedure may vary among doctors and hospitals. What happens after the procedure? You will be watched until you leave the hospital or clinic. This includes checking your heart rate, breathing rate, oxygen, and blood pressure. Your cut will be watched for bleeding. You will need to lie still for a few hours. Do not drive for 24 hours or as long as your doctor tells you. Summary Cardiac ablation is a procedure to  destroy some heart tissue. This is done to treat heart rhythm problems. Tell your doctor about any medical conditions you may have. Tell him or her about all medicines you are taking to treat them. This is a safe procedure. But problems may occur. These include infection, bruising, bleeding, and damage to nearby areas of your body. Follow what your doctor tells you about food and drink. You may also be told to change or stop some of your medicines. After the procedure, do not drive for 24 hours or as long as your doctor tells you. This information is not intended to replace advice given to you by your health care provider. Make sure you discuss any questions you have with your health care provider. Document Revised: 04/01/2021 Document Reviewed: 12/12/2018 Elsevier Patient Education  [place].  .

## 2022-02-05 ENCOUNTER — Encounter (HOSPITAL_BASED_OUTPATIENT_CLINIC_OR_DEPARTMENT_OTHER): Payer: Self-pay

## 2022-02-05 ENCOUNTER — Emergency Department (HOSPITAL_BASED_OUTPATIENT_CLINIC_OR_DEPARTMENT_OTHER)
Admission: EM | Admit: 2022-02-05 | Discharge: 2022-02-05 | Disposition: A | Discharge status: Home / Self Care | Payer: Medicare PPO | Attending: Emergency Medicine | Admitting: Emergency Medicine

## 2022-02-05 ENCOUNTER — Emergency Department (HOSPITAL_BASED_OUTPATIENT_CLINIC_OR_DEPARTMENT_OTHER): Payer: Medicare PPO

## 2022-02-05 ENCOUNTER — Other Ambulatory Visit: Payer: Self-pay

## 2022-02-05 DIAGNOSIS — Z7901 Long term (current) use of anticoagulants: Secondary | ICD-10-CM | POA: Insufficient documentation

## 2022-02-05 DIAGNOSIS — R002 Palpitations: Secondary | ICD-10-CM | POA: Diagnosis not present

## 2022-02-05 DIAGNOSIS — N189 Chronic kidney disease, unspecified: Secondary | ICD-10-CM | POA: Insufficient documentation

## 2022-02-05 DIAGNOSIS — I509 Heart failure, unspecified: Secondary | ICD-10-CM | POA: Diagnosis not present

## 2022-02-05 DIAGNOSIS — R079 Chest pain, unspecified: Secondary | ICD-10-CM | POA: Diagnosis not present

## 2022-02-05 DIAGNOSIS — I130 Hypertensive heart and chronic kidney disease with heart failure and stage 1 through stage 4 chronic kidney disease, or unspecified chronic kidney disease: Secondary | ICD-10-CM | POA: Insufficient documentation

## 2022-02-05 DIAGNOSIS — I493 Ventricular premature depolarization: Secondary | ICD-10-CM | POA: Diagnosis not present

## 2022-02-05 DIAGNOSIS — I491 Atrial premature depolarization: Secondary | ICD-10-CM | POA: Diagnosis not present

## 2022-02-05 LAB — BASIC METABOLIC PANEL
Anion gap: 13 (ref 5–15)
BUN: 33 mg/dL — ABNORMAL HIGH (ref 8–23)
CO2: 25 mmol/L (ref 22–32)
Calcium: 9.7 mg/dL (ref 8.9–10.3)
Chloride: 97 mmol/L — ABNORMAL LOW (ref 98–111)
Creatinine, Ser: 1.74 mg/dL — ABNORMAL HIGH (ref 0.44–1.00)
GFR, Estimated: 32 mL/min — ABNORMAL LOW (ref >60)
Glucose, Bld: 117 mg/dL — ABNORMAL HIGH (ref 70–99)
Potassium: 3.5 mmol/L (ref 3.5–5.1)
Sodium: 135 mmol/L (ref 135–145)

## 2022-02-05 LAB — CBC
HCT: 43.4 % (ref 36.0–46.0)
Hemoglobin: 14.8 g/dL (ref 12.0–15.0)
MCH: 29.7 pg (ref 26.0–34.0)
MCHC: 34.1 g/dL (ref 30.0–36.0)
MCV: 87 fL (ref 80.0–100.0)
Platelets: 144 10*3/uL — ABNORMAL LOW (ref 150–400)
RBC: 4.99 MIL/uL (ref 3.87–5.11)
RDW: 14.7 % (ref 11.5–15.5)
WBC: 6.7 10*3/uL (ref 4.0–10.5)
nRBC: 0 % (ref 0.0–0.2)

## 2022-02-05 LAB — TROPONIN I (HIGH SENSITIVITY): Troponin I (High Sensitivity): <2 ng/L (ref <18)

## 2022-02-05 MED ORDER — POTASSIUM CHLORIDE CRYS ER 20 MEQ PO TBCR
20.0000 meq | EXTENDED_RELEASE_TABLET | Freq: Once | ORAL | Status: AC
  Administered 2022-02-05: 20 meq via ORAL
  Filled 2022-02-05: qty 1

## 2022-02-05 NOTE — Discharge Instructions (Addendum)
You were seen in the emergency department for your palpitations.  Your heart appeared to be in normal sinus rhythm with an occasional extra beat called a PAC which is probably why you are feeling palpitations.  Your heart has remained in this rhythm since you have been in the emergency department.  Your workup was within normal range with a low normal potassium so we gave you some potassium repletion here.  You should follow-up with your primary doctor or your cardiologist to have your symptoms rechecked.  You should return to the emergency department if you feel like your heart is racing and is not going away, you have severe chest pain or shortness of breath, you pass out or if you have any other new or concerning symptoms.

## 2022-02-05 NOTE — ED Triage Notes (Signed)
Pt presents with c/o her heart fluttering for one hour. Treated for same earlier this week and diagnosed with atrial flutter. Prescribed amiodorone for same but having episode again today. Episode began while watching TV. Pt reports pressure in chest but denies pain. Denies n/v

## 2022-02-05 NOTE — ED Provider Notes (Signed)
[facility] EMERGENCY DEPARTMENT Provider Note   CSN: [id number] Arrival date & time: 02/05/22  1938     History  Chief Complaint  Patient presents with   Palpitations   Chest Pain    Kathleen [name] is a 67 y.o. female.  Patient is a 67 year old female with a past medical history of A-fib on Eliquis and recently started on amiodarone, CHF, CKD, hypertension, diabetes presenting to the emergency department with palpitations.  Patient states that she was sitting on the couch at rest when she suddenly felt like her heart was flip-flopping.  She states that she had associated shortness of breath and lightheadedness.  She states that she felt similar to this so when she was seen in the emergency department a few days ago and was found to be in A-fib with RVR.  She denied any associated chest pain.  She denies any recent nausea, vomiting or diarrhea and states she has been taking her medications as prescribed.  She states that her symptoms feel significantly improved now compared to when she first arrived at the emergency department.  The history is provided by the patient.  Palpitations Associated symptoms: chest pain   Chest Pain Associated symptoms: palpitations        Home Medications Prior to Admission medications   Medication Sig Start Date End Date Taking? Authorizing Provider  Accu-Chek Softclix Lancets lancets Use as instructed E11.9 06/07/21   [name], [name], MD  allopurinol (ZYLOPRIM) 100 MG tablet TAKE TWO TABLETS BY MOUTH DAILY 03/14/21   [name], [name], MD  amiodarone (PACERONE) 200 MG tablet Take 1 tablet (200 mg total) by mouth daily. 02/01/22   Camnitz, Ocie Doyne, MD  benztropine (COGENTIN) 1 MG tablet Take 1 mg by mouth 2 (two) times daily. 01/14/17   [provider]  Biotin w/ Vitamins C & E (HAIR/SKIN/NAILS PO) Take 1 tablet by mouth daily.    [provider]  Blood Glucose Calibration (ACCU-CHEK GUIDE CONTROL) LIQD 1 drop by In  Vitro route as directed. E11.9 06/07/21   [name], [name], MD  Blood Glucose Monitoring Suppl (ACCU-CHEK GUIDE ME) w/Device KIT 1 each by Does not apply route in the morning and at bedtime. E11.9 11/29/21   [name], [name], MD  calcitRIOL (ROCALTROL) 0.25 MCG capsule TAKE ONE CAPSULE BY MOUTH DAILY 10/03/21   [name], [name], MD  Cholecalciferol (VITAMIN D3) 50 MCG (2000 UT) capsule Take 2,000 Units by mouth daily.    [provider]  divalproex (DEPAKOTE ER) 500 MG 24 hr tablet Take 1,000 mg by mouth at bedtime.    [provider]  empagliflozin (JARDIANCE) 10 MG TABS tablet Take 1 tablet (10 mg total) by mouth daily. 12/20/20   Bensimhon, Shaune Pascal, MD  furosemide (LASIX) 80 MG tablet Take 0.5 tablets (40 mg total) by mouth 2 (two) times daily. Can take an additional '40mg'$  as needed for weight gain of 3 pounds for more in 24 hours 01/05/21   Joette Catching, PA-C  glucose blood (ACCU-CHEK GUIDE) test strip Use as instructed 06/07/21   [name], [name], MD  LATUDA 60 MG TABS Take 60 mg by mouth at bedtime.  03/08/16   [provider]  levalbuterol Penne Lash HFA) 45 MCG/ACT inhaler Inhale 2 puffs into the lungs every 4 (four) hours as needed for wheezing. 03/04/19 01/14/23  Eugenie Filler, MD  levothyroxine (SYNTHROID) 50 MCG tablet Take 1 tablet (50 mcg total) by mouth daily before breakfast. 11/03/21  [name], [name], MD  metFORMIN (GLUCOPHAGE) 500 MG tablet TAKE ONE TABLET BY MOUTH TWICE A DAY WITH MEALS 07/13/21   [name], [name], MD  metoprolol tartrate (LOPRESSOR) 25 MG tablet Take 25 mg by mouth 2 (two) times daily.    [provider]  nitroGLYCERIN (NITROSTAT) 0.4 MG SL tablet Place 1 tablet (0.4 mg total) under the tongue every 5 (five) minutes as needed for chest pain. For chest pain 12/07/11   Barrett, Evelene Croon, PA-C  NON FORMULARY Place 1 drop into both eyes 2 (two) times daily. Regener-Eyes eye drops    [provider]   OLANZapine (ZYPREXA) 15 MG tablet Take 15 mg by mouth at bedtime. 02/15/19   [provider]  prednisoLONE acetate (PRED FORTE) 1 % ophthalmic suspension Place 1 drop into both eyes See admin instructions. Instill 1 drop into both eyes 4 times daily for 2 weeks, then 2 times daily for 4 weeks 11/16/21   [provider]  rosuvastatin (CRESTOR) 20 MG tablet TAKE ONE TABLET BY MOUTH DAILY 10/03/21   [name], [name], MD  tirzepatide [facility]) 7.5 MG/0.5ML Pen Inject 7.5 mg into the skin once a week. 09/28/21   [name], [name], MD  XARELTO 20 MG TABS tablet TAKE ONE TABLET BY MOUTH AT BEDTIME 10/03/21   Bensimhon, Shaune Pascal, MD      Allergies    Tetanus-diphtheria toxoids td, Influenza vaccines, Tetanus toxoids, and Lamictal [lamotrigine]    Review of Systems   Review of Systems  Cardiovascular:  Positive for chest pain and palpitations.    Physical Exam Updated Vital Signs BP 114/71 (BP Location: Right Arm)   Pulse 80   Temp 98.6 F (37 C) (Oral)   Resp 12   Ht '5\' 10"'$  (1.778 m)   Wt 115 kg   SpO2 98%   BMI 36.38 kg/m  Physical Exam Vitals and nursing note reviewed.  Constitutional:      General: She is not in acute distress.    Appearance: She is well-developed. She is obese.  HENT:     Head: Normocephalic and atraumatic.  Eyes:     Extraocular Movements: Extraocular movements intact.  Cardiovascular:     Rate and Rhythm: Normal rate and regular rhythm.     Pulses:          Radial pulses are 2+ on the right side and 2+ on the left side.     Heart sounds: Normal heart sounds.  Pulmonary:     Effort: Pulmonary effort is normal.     Breath sounds: Normal breath sounds.  Abdominal:     Palpations: Abdomen is soft.     Tenderness: There is no abdominal tenderness.  Musculoskeletal:        General: Normal range of motion.     Cervical back: Normal range of motion and neck supple.     Right lower leg: No edema.     Left lower leg: No edema.  Skin:     General: Skin is warm and dry.  Neurological:     General: No focal deficit present.     Mental Status: She is alert and oriented to person, place, and time.  Psychiatric:        Mood and Affect: Mood normal.        Behavior: Behavior normal.     ED Results / Procedures / Treatments   Labs (all labs ordered are listed, but only abnormal results are displayed) Labs Reviewed  BASIC  METABOLIC PANEL - Abnormal; Notable for the following components:      Result Value   Chloride 97 (*)    Glucose, Bld 117 (*)    BUN 33 (*)    Creatinine, Ser 1.74 (*)    GFR, Estimated 32 (*)    All other components within normal limits  CBC - Abnormal; Notable for the following components:   Platelets 144 (*)    All other components within normal limits  TROPONIN I (HIGH SENSITIVITY)    EKG EKG Interpretation  Date/Time:  Sunday February 05 2022 19:46:18 EST Ventricular Rate:  85 PR Interval:  154 QRS Duration: 84 QT Interval:  374 QTC Calculation: 445 R Axis:   58 Text Interpretation: Sinus rhythm with Premature supraventricular complexes Low voltage QRS Borderline ECG Now in sinus rhythm compared to prior EKG Confirmed by Leanord Asal ([id number]) on 02/05/2022 8:55:43 PM  Radiology DG Chest 2 View  Result Date: 02/05/2022 CLINICAL DATA:  chest pain EXAM: CHEST - 2 VIEW COMPARISON:  January 30, 2022 FINDINGS: The cardiomediastinal silhouette is unchanged in contour. No pleural effusion. No pneumothorax. No acute pleuroparenchymal abnormality. Visualized abdomen is unremarkable. Multilevel degenerative changes of the thoracic spine. IMPRESSION: No acute cardiopulmonary abnormality. Electronically Signed   By: Valentino Saxon M.D.   On: 02/05/2022 20:10    Procedures Procedures    Medications Ordered in ED Medications  potassium chloride SA (KLOR-CON M) CR tablet 20 mEq (has no administration in time range)    ED Course/ Medical Decision Making/ A&P                              Medical Decision Making This patient presents to the ED with chief complaint(s) of palpitations with pertinent past medical history of A-fib, CHF, hypertension, diabetes, CKD which further complicates the presenting complaint. The complaint involves an extensive differential diagnosis and also carries with it a high risk of complications and morbidity.    The differential diagnosis includes ACS, arrhythmia, anemia, electrolyte abnormality, dehydration  Additional history obtained: Additional history obtained from N/A Records reviewed outpatient cardiology records and recent ED records  ED Course and Reassessment: Patient EKG performed on arrival which showed normal sinus rhythm with occasional PACs.  Upon my evaluation she is on the cardiac monitor and still appears to be in normal sinus rhythm with an occasional PAC.  Her labs are within normal range with a potassium of 3.5 and will be given small dose of potassium repletion to help prevent further arrhythmia.  The patient has been in the emergency department for nearly 2 hours and has stayed in sinus rhythm and is stable for discharge home with outpatient primary care and cardiology follow-up.  She was given strict return precautions.  Independent labs interpretation:  The following labs were independently interpreted: Within normal range  Independent visualization of imaging: - I independently visualized the following imaging with scope of interpretation limited to determining acute life threatening conditions related to emergency care: Chest x-ray, which revealed no acute disease  Consultation: - Consulted or discussed management/test interpretation w/ external professional: N/A  Consideration for admission or further workup: Patient has no emergent conditions requiring admission or further work-up at this time and is stable for discharge home with primary care follow-up  Social Determinants of health: N/A    Amount and/or Complexity  of Data Reviewed Labs: ordered. Radiology: ordered.  Risk Prescription drug management.  Final Clinical Impression(s) / ED Diagnoses Final diagnoses:  PAC (premature atrial contraction)  Palpitations    Rx / DC Orders ED Discharge Orders     None         Kemper Durie, DO 02/05/22 2128

## 2022-02-07 ENCOUNTER — Other Ambulatory Visit: Payer: Self-pay | Admitting: Family Medicine

## 2022-02-07 ENCOUNTER — Other Ambulatory Visit (HOSPITAL_COMMUNITY): Payer: Self-pay | Admitting: Internal Medicine

## 2022-02-07 DIAGNOSIS — E119 Type 2 diabetes mellitus without complications: Secondary | ICD-10-CM

## 2022-02-07 DIAGNOSIS — I10 Essential (primary) hypertension: Secondary | ICD-10-CM

## 2022-02-07 DIAGNOSIS — E039 Hypothyroidism, unspecified: Secondary | ICD-10-CM

## 2022-02-08 DIAGNOSIS — D631 Anemia in chronic kidney disease: Secondary | ICD-10-CM | POA: Diagnosis not present

## 2022-02-08 DIAGNOSIS — E1122 Type 2 diabetes mellitus with diabetic chronic kidney disease: Secondary | ICD-10-CM | POA: Diagnosis not present

## 2022-02-08 DIAGNOSIS — E210 Primary hyperparathyroidism: Secondary | ICD-10-CM | POA: Diagnosis not present

## 2022-02-08 DIAGNOSIS — N183 Chronic kidney disease, stage 3 unspecified: Secondary | ICD-10-CM | POA: Diagnosis not present

## 2022-02-08 DIAGNOSIS — I129 Hypertensive chronic kidney disease with stage 1 through stage 4 chronic kidney disease, or unspecified chronic kidney disease: Secondary | ICD-10-CM | POA: Diagnosis not present

## 2022-02-13 ENCOUNTER — Ambulatory Visit: Payer: Medicare PPO | Attending: Cardiovascular Disease | Admitting: Cardiology

## 2022-02-13 VITALS — BP 108/62 | HR 83 | Resp 20 | Wt 250.0 lb

## 2022-02-13 DIAGNOSIS — I4819 Other persistent atrial fibrillation: Secondary | ICD-10-CM | POA: Diagnosis not present

## 2022-02-13 DIAGNOSIS — Z79899 Other long term (current) drug therapy: Secondary | ICD-10-CM | POA: Diagnosis not present

## 2022-02-13 DIAGNOSIS — T148XXA Other injury of unspecified body region, initial encounter: Secondary | ICD-10-CM | POA: Diagnosis not present

## 2022-02-13 DIAGNOSIS — L089 Local infection of the skin and subcutaneous tissue, unspecified: Secondary | ICD-10-CM | POA: Diagnosis not present

## 2022-02-13 NOTE — Patient Instructions (Signed)
   Nurse Visit   Date of Encounter: 02/13/2022 ID: Kathleen [name], DOB [birth year], MRN [id number]  PCP:  Darreld Mclean, MD   [facility] Cardiologist:  Glori Bickers, MD Electrophysiologist:  Will Meredith Leeds, MD  Advanced Heart Failure:  Glori Bickers, MD  Sleep Medicine:  Fransico Him, MD      Visit Details   VS:  BP 108/62 Comment: LT ARM  Pulse 83   Resp 20   Wt 250 lb 0.4 oz (113.4 kg)   SpO2 98%   BMI 35.87 kg/m  , BMI Body mass index is 35.87 kg/m.  Wt Readings from Last 3 Encounters:  02/13/22 250 lb 0.4 oz (113.4 kg)  02/05/22 253 lb 8.5 oz (115 kg)  02/01/22 254 lb (115.2 kg)     Reason for visit: RN Visit / EKG; New start Amiodarone Performed today:  Weight, Vitals, EKG, Provider consulted:DOD, Dr. Johnsie Cancel, and Education Changes (medications, testing, etc.) : Amiodarone 200 mg daily Length of Visit: 30 minutes    Medications Adjustments/Labs and Tests Ordered: Orders Placed This Encounter  Procedures   EKG 12-Lead   RN / EKG visit for patient starting Amiodarone 200 mg once daily;  Medication started on 02/01/2022, order per Dr. Elliot Cousin.  Pt weight and vital signs taken / WNL.  EKG preformed taken to DOD, Dr. Johnsie Cancel.  Normal sinus rhythm assessed; Vent rate 81 bpm, PR interval 166 ms, QRS duration 78 ms, QT/Qtc 386/448 ms.  Per Dr. Johnsie Cancel, no changes or new orders; Pt to continue Dr. Curt Bears plan of care established on 02/01/2022.  EKG will be taken to the front and scanned into the Pt chart.       Signed, Varney Daily, RN  02/13/2022 11:30 AM

## 2022-02-14 NOTE — Progress Notes (Signed)
Nurse visit ECG

## 2022-02-15 DIAGNOSIS — G4733 Obstructive sleep apnea (adult) (pediatric): Secondary | ICD-10-CM | POA: Diagnosis not present

## 2022-02-15 DIAGNOSIS — L821 Other seborrheic keratosis: Secondary | ICD-10-CM | POA: Diagnosis not present

## 2022-02-15 DIAGNOSIS — L570 Actinic keratosis: Secondary | ICD-10-CM | POA: Diagnosis not present

## 2022-02-15 DIAGNOSIS — D225 Melanocytic nevi of trunk: Secondary | ICD-10-CM | POA: Diagnosis not present

## 2022-02-22 DIAGNOSIS — N183 Chronic kidney disease, stage 3 unspecified: Secondary | ICD-10-CM | POA: Diagnosis not present

## 2022-02-23 ENCOUNTER — Encounter: Payer: Self-pay | Admitting: Family Medicine

## 2022-02-23 DIAGNOSIS — F411 Generalized anxiety disorder: Secondary | ICD-10-CM | POA: Diagnosis not present

## 2022-02-23 DIAGNOSIS — E119 Type 2 diabetes mellitus without complications: Secondary | ICD-10-CM

## 2022-02-23 DIAGNOSIS — F3112 Bipolar disorder, current episode manic without psychotic features, moderate: Secondary | ICD-10-CM | POA: Diagnosis not present

## 2022-02-24 MED ORDER — TIRZEPATIDE 10 MG/0.5ML ~~LOC~~ SOAJ: 10.0000 mg | SUBCUTANEOUS | 1 refills | Status: DC

## 2022-02-27 DIAGNOSIS — Z6826 Body mass index (BMI) 26.0-26.9, adult: Secondary | ICD-10-CM | POA: Diagnosis not present

## 2022-02-27 DIAGNOSIS — Z01419 Encounter for gynecological examination (general) (routine) without abnormal findings: Secondary | ICD-10-CM | POA: Diagnosis not present

## 2022-02-28 ENCOUNTER — Encounter: Payer: Self-pay | Admitting: Family Medicine

## 2022-03-09 ENCOUNTER — Other Ambulatory Visit: Payer: Self-pay | Admitting: Obstetrics and Gynecology

## 2022-03-09 DIAGNOSIS — Z1231 Encounter for screening mammogram for malignant neoplasm of breast: Secondary | ICD-10-CM

## 2022-03-13 ENCOUNTER — Ambulatory Visit: Payer: Medicare PPO | Admitting: Cardiology

## 2022-03-13 ENCOUNTER — Other Ambulatory Visit: Payer: Self-pay | Admitting: Family Medicine

## 2022-03-13 DIAGNOSIS — E119 Type 2 diabetes mellitus without complications: Secondary | ICD-10-CM

## 2022-03-18 DIAGNOSIS — G4733 Obstructive sleep apnea (adult) (pediatric): Secondary | ICD-10-CM | POA: Diagnosis not present

## 2022-03-23 ENCOUNTER — Other Ambulatory Visit: Payer: Self-pay | Admitting: Family Medicine

## 2022-03-23 DIAGNOSIS — E119 Type 2 diabetes mellitus without complications: Secondary | ICD-10-CM

## 2022-03-23 NOTE — Progress Notes (Addendum)
[facility] [address], [place], [place] [address] [phone]  Date:  03/30/2022   Name:  Kathleen [name]   DOB:  12/27/1955   MRN:  [id number]  PCP:  Darreld Mclean, MD    Chief Complaint: 6 month follow up (Concerns/ questions: pt says she has been Dx with A flutter and will be having an ablation. /Cologaurd due/Urine Albumin due/)   History of Present Illness:  Kathleen [name] is a 67 y.o. very pleasant female patient who presents with the following:  Pt seen today for 6 month recheck Last seen by myself in October - h/o morbid obesity, chronic diastolic HF, DM2, hypothyroidism, hyperparathyroidism status post parathyroidectomy, HTN, HL and CAD with NSTEMI 2009, CRI. Sleep study 11/2013 was negative. Also has history of A fib, bipolar disorder managed by Dr Ronnald Ramp She had an ablation for atrial fibrillation on 11/23.  She did well for a while, then amiodarone was added in January.  However, she has had a lot of fatigue from amiodarone.  They plan to do a repeat ablation this coming May  We increased her Mounjaro to 7.5 mg last year and her DM has been under [name] control  I increased her Mounjaro to 10 on her request in February -however, pt notes she actually received 12.5 mg and has taken 1 shot so far.  No major SE --addendum.  Patient checked her supply at home and she actually does have the 10 mg strength  Most recent visit with nephrology was in September  Lab Results  Component Value Date   HGBA1C 6.0 09/28/2021   She did a colonoscopy in 2021- due this year, 3-year follow-up Urine micro is due Covid booster is UTD   She has bilateral knee pain- left more than right  This does limit her walking, she uses a cane as needed   Kathleen [name] is her psychiatric NP at [facility]  She would like a copy of all labs  Fax number [phone]  Patient Active Problem List   Diagnosis Date Noted    Diabetes mellitus without complication ([place]) [id number]   Hypercoagulable state due to paroxysmal atrial fibrillation ([place]) 06/30/2021   At increased risk of breast cancer 03/01/2021   Osteopenia 12/02/2019   AKI (acute kidney injury) ([place])    CAP (community acquired pneumonia) 03/01/2019   Atrial fibrillation with RVR ([place]) 02/28/2019   Chronic kidney disease (CKD), stage III (moderate) ([place]) 01/14/2019   Vitamin D deficiency 07/22/2018   Complete rotator cuff tear or rupture of right shoulder, not specified as traumatic 02/27/2017   Auditory hallucination 12/13/2016   Gout 06/29/2016   Type 2 diabetes mellitus ([place]) 12/31/2015   Coronary artery dissection    Lung nodule seen on imaging study 09/24/2014   Paroxysmal atrial fibrillation ([place]) 09/11/2014   Asthmatic bronchitis with exacerbation 05/25/2014   Primary hyperparathyroidism ([place]) 04/15/2014   Chronic diastolic heart failure ([place]) 10/02/2013   Shortness of breath 08/31/2013   Venous stasis dermatitis of both lower extremities 08/19/2013   Nocturia 06/12/2013   Morbid obesity ([place]) 08/09/2012   Urinary incontinence, urge 07/11/2012   CAD (coronary artery disease) 10/31/2010   NEOPLASM OF UNCERTAIN BEHAVIOR OF SKIN 02/11/2010   Bipolar disorder ([place]) 09/14/2009   ACUT MYOCARD INFARCT UNS SITE SUBSQT EPIS CARE 07/02/2008   Hyperlipidemia 05/23/2008   UNSPECIFIED DISORDER OF THYROID 02/04/2008   HYPERTENSION, BENIGN ESSENTIAL 02/04/2008  MYOCARDIAL INFARCTION, HX OF 03/05/2007    Past Medical History:  Diagnosis Date   Anemia    Anxiety    Asthma    Atrial fibrillation ([place])    Bipolar disorder ([place])    CAD (coronary artery disease)    Cancer (HCC)    basal cell carcinoma on right hand, papilloma left breast   CHF (congestive heart failure) ([place])    pt seen at heart/vascular spec clinic 08/14/2014    Chronic kidney disease    Coarse tremors    hands   Coronary artery disease    Depression    DVT (deep vein  thrombosis) in pregnancy    pt. 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[place]) 02/2007   non-q-wave with second septal perforator   Hyperlipidemia    Hypertension    Hypothyroidism    history of took medication, has resolved   Knee pain, left    Lower extremity deep venous thrombosis (HCC)    20 years ago    Measles    hx of in childhood    Morbid obesity with BMI of 40.0-44.9, adult ([place])    Pain at surgical incision    Left breast from procedure 09/11/16   Pneumonia    hx of    PONV (postoperative nausea and vomiting)    also slow to wake up   Psychiatric hospitalization 05/2008   Shingles    20 years ago    Shortness of breath dyspnea    exercise    Type 2 diabetes mellitus ([place]) 12/31/2015   Urinary incontinence    UTI (urinary tract infection)     Past Surgical History:  Procedure Laterality Date   ATRIAL FIBRILLATION ABLATION N/A 12/06/2021   Procedure: ATRIAL FIBRILLATION ABLATION;  Surgeon: Constance Haw, MD;  Location: [facility];  Service: Cardiovascular;  Laterality: N/A;   BASAL CELL CARCINOMA EXCISION     BREAST EXCISIONAL BIOPSY Left 2018   BREAST LUMPECTOMY WITH RADIOACTIVE SEED LOCALIZATION Left 09/11/2016   Procedure: LEFT BREAST LUMPECTOMY WITH RADIOACTIVE SEED LOCALIZATION;  Surgeon: Excell Seltzer, MD;  Location: [facility];  Service: General;  Laterality: Left;   BREAST MASS EXCISION     age 83, benign tumor   CARDIAC CATHETERIZATION     CARDIAC CATHETERIZATION N/A 12/30/2015   Procedure: Left Heart Cath and Coronary Angiography;  Surgeon: Belva Crome, MD;  Location: [facility];  Service: Cardiovascular;  Laterality: N/A;   COLONOSCOPY  07/15/2009   [facility]   DILATATION & CURETTAGE/HYSTEROSCOPY WITH MYOSURE N/A 09/22/2016   Procedure: DILATATION & CURETTAGE/HYSTEROSCOPY WITH MYOSURE;  Surgeon: Molli Posey, MD;  Location: [facility];  Service: Gynecology;  Laterality: N/A;   DILATION AND CURETTAGE OF UTERUS     KNEE SURGERY     right, removed cartilage   PARATHYROIDECTOMY N/A 08/25/2014   Procedure: PARATHYROIDECTOMY;  Surgeon: Armandina Gemma, MD;  Location: [facility];  Service: General;  Laterality: N/A;   SHOULDER ARTHROSCOPY WITH ROTATOR CUFF REPAIR AND SUBACROMIAL DECOMPRESSION Right 02/27/2017   Procedure: Right shoulder arthroscopic rotator cuff repair with biceps tenodesis and subacromial decompression;  Surgeon: Nicholes Stairs, MD;  Location: [facility];  Service: Orthopedics;  Laterality: Right;  150 mins   TONSILLECTOMY     TUBAL LIGATION      Social  History   Tobacco Use   Smoking status: Never   Smokeless tobacco: Never   Tobacco comments:    Never smoker 01/03/22  Vaping Use   Vaping Use: Never used  Substance Use Topics   Alcohol use: Yes    Alcohol/week: 0.0 standard drinks of alcohol    Comment: occ.   Drug use: No    Family History  Problem Relation Age of Onset   Breast cancer Mother 45   Alcohol abuse Mother    Alzheimer's disease Mother    Lung cancer Mother    Alcohol abuse Father    Alzheimer's disease Father    Parkinson's disease Father    Alcohol abuse Maternal Grandfather    Drug abuse Maternal Grandfather    Alcohol abuse Maternal Grandmother    Alcohol abuse Paternal Grandfather    Alcohol abuse Paternal Grandmother    Schizophrenia Cousin    Diabetes Brother    Hypertension Brother    Colon cancer Neg Hx    Esophageal cancer Neg Hx    Rectal cancer Neg Hx    Stomach cancer Neg Hx     Allergies  Allergen Reactions   Tetanus-Diphtheria Toxoids Td Itching    Other reaction(s): Myalgias (intolerance) Vigorous Local Soft Tissue Reaction to TDAP   Influenza Vaccines Hives    Can eat foods with cooked eggs; CANNOT handle when part of flu vaccine, etc.    Tetanus Toxoids     Vigorous local reaction to tdap with local pain and itching     Lamictal [Lamotrigine] Hives and Rash    Medication list has been reviewed and updated.  Current Outpatient Medications on File Prior to Visit  Medication Sig Dispense Refill   ACCU-CHEK GUIDE test strip USE AS INSTRUCTED 300 strip 3   Accu-Chek Softclix Lancets lancets USE AS DIRECTED 300 each 3   allopurinol (ZYLOPRIM) 100 MG tablet TAKE TWO TABLETS BY MOUTH DAILY 180 tablet 1   amiodarone (PACERONE) 200 MG tablet Take 1 tablet (200 mg total) by mouth daily. 30 tablet 3   benztropine (COGENTIN) 1 MG tablet Take 1 mg by mouth 2 (two) times daily.  2   Biotin w/ Vitamins C & E (HAIR/SKIN/NAILS PO) Take 1 tablet by mouth daily.     Blood Glucose Calibration (ACCU-CHEK GUIDE CONTROL) LIQD 1 drop by In Vitro route as directed. E11.9 1 each 1   Blood Glucose Monitoring Suppl (ACCU-CHEK GUIDE ME) w/Device KIT 1 each by Does not apply route in the morning and at bedtime. E11.9 1 kit 0   calcitRIOL (ROCALTROL) 0.25 MCG capsule TAKE ONE CAPSULE BY MOUTH DAILY 90 capsule 1   Cholecalciferol (VITAMIN D3) 50 MCG (2000 UT) capsule Take 2,000 Units by mouth daily.     divalproex (DEPAKOTE ER) 500 MG 24 hr tablet Take 1,000 mg by mouth at bedtime.     furosemide (LASIX) 80 MG tablet TAKE ONE TABLET BY MOUTH EVERY MORNING AND ONE-HALF TABLET BY MOUTH IN THE AFTERNOON 135 tablet 3   JARDIANCE 10 MG TABS tablet TAKE ONE TABLET BY MOUTH DAILY 90 tablet 3   LATUDA 60 MG TABS Take 60 mg by mouth at bedtime.      levalbuterol (XOPENEX HFA) 45 MCG/ACT inhaler Inhale 2 puffs into the lungs every 4 (four) hours as needed for wheezing. 1 Inhaler 1   levothyroxine (SYNTHROID) 50 MCG tablet TAKE ONE TABLET BY MOUTH DAILY BEFORE BREAKFAST 90 tablet 1   metFORMIN (GLUCOPHAGE) 500 MG tablet TAKE 1  TABLET BY MOUTH TWICE A DAY WITH MEALS 180 tablet 1   metoprolol tartrate (LOPRESSOR) 25 MG tablet Take 25 mg by mouth 2 (two) times daily.     nitroGLYCERIN (NITROSTAT) 0.4 MG SL tablet Place 1 tablet (0.4 mg total) under the  tongue every 5 (five) minutes as needed for chest pain. For chest pain 25 tablet 3   NON FORMULARY Place 1 drop into both eyes 2 (two) times daily. Regener-Eyes eye drops     OLANZapine (ZYPREXA) 15 MG tablet Take 15 mg by mouth at bedtime.     rosuvastatin (CRESTOR) 20 MG tablet TAKE ONE TABLET BY MOUTH DAILY 90 tablet 2   tirzepatide (MOUNJARO) 10 MG/0.5ML Pen Inject 10 mg into the skin once a week. 6 mL 1   XARELTO 20 MG TABS tablet TAKE ONE TABLET BY MOUTH AT BEDTIME 90 tablet 3   No current facility-administered medications on file prior to visit.    Review of Systems:  As per HPI- otherwise negative.   Physical Examination: Vitals:   03/30/22 0952  BP: 118/78  Pulse: 77  Resp: 18  Temp: 97.6 F (36.4 C)  SpO2: 98%   Vitals:   03/30/22 0952  Weight: 244 lb 6.4 oz (110.9 kg)  Height: '5\' 11"'$  (1.803 m)   Body mass index is 34.09 kg/m. Ideal Body Weight: Weight in (lb) to have BMI = 25: 178.9  GEN: no acute distress. Obese, tall build HEENT: Atraumatic, Normocephalic.  Ears and Nose: No external deformity. CV: RRR, No M/G/R. No JVD. No thrill. No extra heart sounds. PULM: CTA B, no wheezes, crackles, rhonchi. No retractions. No resp. distress. No accessory muscle use. ABD: S, NT, ND, +BS. No rebound. No HSM. EXTR: No c/c/e PSYCH: Normally interactive. Conversant.  Slow gait due to knee pain.  Crepitus both knees   Assessment and Plan: Type 2 diabetes mellitus without complication, without long-term current use of insulin ([place]) - Plan: Hemoglobin A1c, Microalbumin / creatinine urine ratio, Comp Met (CMET)  Acquired hypothyroidism - Plan: TSH  HYPERTENSION, BENIGN ESSENTIAL  Pure hypercholesterolemia - Plan: Lipid panel  Screening for colon cancer  Medication monitoring encounter - Plan: Valproic Acid level, Comp Met (CMET)  Following up today Send labs to Liz Malady psychiatric NP - when results come in  Fax number [phone]  Follow-up on DM,  thyroid, lipids Reminded pt she is due for a colonoscopy- she will call GI BP well controlled  Signed Lamar Blinks, MD  Received labs as below, message to patient GFR is now below 30, will discontinue metformin Would like to back off on her diuretic, will consult with her cardiologist Patient should be due for a nephrology visit soon  Results for orders placed or performed in visit on 03/30/22  Lipid panel  Result Value Ref Range   Cholesterol 124 0 - 200 mg/dL   Triglycerides 96.0 0.0 - 149.0 mg/dL   HDL 51.90 >39.00 mg/dL   VLDL 19.2 0.0 - 40.0 mg/dL   LDL Cholesterol 53 0 - 99 mg/dL   Total CHOL/HDL Ratio 2    NonHDL 72.40   Hemoglobin A1c  Result Value Ref Range   Hgb A1c MFr Bld 5.5 4.6 - 6.5 %  TSH  Result Value Ref Range   TSH 4.67 0.35 - 5.50 uIU/mL  Microalbumin / creatinine urine ratio  Result Value Ref Range   Microalb, Ur 4.1 (H) 0.0 - 1.9 mg/dL   Creatinine,U 52.7 mg/dL   Microalb Creat Ratio  7.7 0.0 - 30.0 mg/g  Comp Met (CMET)  Result Value Ref Range   Sodium 140 135 - 145 mEq/L   Potassium 4.0 3.5 - 5.1 mEq/L   Chloride 96 96 - 112 mEq/L   CO2 32 19 - 32 mEq/L   Glucose, Bld 90 70 - 99 mg/dL   BUN 26 (H) 6 - 23 mg/dL   Creatinine, Ser 2.04 (H) 0.40 - 1.20 mg/dL   Total Bilirubin 0.6 0.2 - 1.2 mg/dL   Alkaline Phosphatase 94 39 - 117 U/L   AST 29 0 - 37 U/L   ALT 38 (H) 0 - 35 U/L   Total Protein 6.8 6.0 - 8.3 g/dL   Albumin 4.3 3.5 - 5.2 g/dL   GFR 24.95 (L) >60.00 mL/min   Calcium 10.2 8.4 - 10.5 mg/dL   Wt Readings from Last 3 Encounters:  03/30/22 244 lb 6.4 oz (110.9 kg)  02/13/22 250 lb 0.4 oz (113.4 kg)  02/05/22 253 lb 8.5 oz (115 kg)

## 2022-03-23 NOTE — Patient Instructions (Addendum)
Good to see you again today- I will be in touch with your labs asap and will fax to Pauline Good for you  It looks like your colon is due- please call GI and set up  Please take a photo of your Mounjaro label and send it to me via mychart!    It looks like you are due for a covid booster at your convenience

## 2022-03-28 ENCOUNTER — Encounter: Payer: Self-pay | Admitting: Cardiology

## 2022-03-30 ENCOUNTER — Encounter: Payer: Self-pay | Admitting: Family Medicine

## 2022-03-30 ENCOUNTER — Ambulatory Visit: Payer: Medicare PPO | Admitting: Family Medicine

## 2022-03-30 VITALS — BP 118/78 | HR 77 | Temp 97.6°F | Resp 18 | Ht 71.0 in | Wt 244.4 lb

## 2022-03-30 DIAGNOSIS — Z5181 Encounter for therapeutic drug level monitoring: Secondary | ICD-10-CM

## 2022-03-30 DIAGNOSIS — E039 Hypothyroidism, unspecified: Secondary | ICD-10-CM | POA: Diagnosis not present

## 2022-03-30 DIAGNOSIS — E7800 Pure hypercholesterolemia, unspecified: Secondary | ICD-10-CM | POA: Diagnosis not present

## 2022-03-30 DIAGNOSIS — I10 Essential (primary) hypertension: Secondary | ICD-10-CM | POA: Diagnosis not present

## 2022-03-30 DIAGNOSIS — Z1211 Encounter for screening for malignant neoplasm of colon: Secondary | ICD-10-CM

## 2022-03-30 DIAGNOSIS — E119 Type 2 diabetes mellitus without complications: Secondary | ICD-10-CM | POA: Diagnosis not present

## 2022-03-30 DIAGNOSIS — N1832 Chronic kidney disease, stage 3b: Secondary | ICD-10-CM

## 2022-03-30 LAB — LIPID PANEL
Cholesterol: 124 mg/dL (ref 0–200)
HDL: 51.9 mg/dL (ref >39.00)
LDL Cholesterol: 53 mg/dL (ref 0–99)
NonHDL: 72.4
Total CHOL/HDL Ratio: 2
Triglycerides: 96 mg/dL (ref 0.0–149.0)
VLDL: 19.2 mg/dL (ref 0.0–40.0)

## 2022-03-30 LAB — MICROALBUMIN / CREATININE URINE RATIO
Creatinine,U: 52.7 mg/dL
Microalb Creat Ratio: 7.7 mg/g (ref 0.0–30.0)
Microalb, Ur: 4.1 mg/dL — ABNORMAL HIGH (ref 0.0–1.9)

## 2022-03-30 LAB — COMPREHENSIVE METABOLIC PANEL
ALT: 38 U/L — ABNORMAL HIGH (ref 0–35)
AST: 29 U/L (ref 0–37)
Albumin: 4.3 g/dL (ref 3.5–5.2)
Alkaline Phosphatase: 94 U/L (ref 39–117)
BUN: 26 mg/dL — ABNORMAL HIGH (ref 6–23)
CO2: 32 mEq/L (ref 19–32)
Calcium: 10.2 mg/dL (ref 8.4–10.5)
Chloride: 96 mEq/L (ref 96–112)
Creatinine, Ser: 2.04 mg/dL — ABNORMAL HIGH (ref 0.40–1.20)
GFR: 24.95 mL/min — ABNORMAL LOW (ref >60.00)
Glucose, Bld: 90 mg/dL (ref 70–99)
Potassium: 4 mEq/L (ref 3.5–5.1)
Sodium: 140 mEq/L (ref 135–145)
Total Bilirubin: 0.6 mg/dL (ref 0.2–1.2)
Total Protein: 6.8 g/dL (ref 6.0–8.3)

## 2022-03-30 LAB — HEMOGLOBIN A1C: Hgb A1c MFr Bld: 5.5 % (ref 4.6–6.5)

## 2022-03-30 LAB — TSH: TSH: 4.67 u[IU]/mL (ref 0.35–5.50)

## 2022-03-30 NOTE — Addendum Note (Signed)
Addended by: Lamar Blinks C on: 03/30/2022 08:07 PM   Modules accepted: Orders

## 2022-03-30 NOTE — Telephone Encounter (Signed)
Pt advised to stop Amiodarone per Dr. Curt Bears. Advised to call office if she has issues w/ her afib prior to ablation in May. Patient verbalized understanding and agreeable to plan.

## 2022-03-31 LAB — VALPROIC ACID LEVEL: Valproic Acid Lvl: 76.4 mg/L (ref 50.0–100.0)

## 2022-04-04 ENCOUNTER — Encounter: Payer: Self-pay | Admitting: Family Medicine

## 2022-04-06 ENCOUNTER — Encounter: Payer: Self-pay | Admitting: Family Medicine

## 2022-04-06 ENCOUNTER — Other Ambulatory Visit (INDEPENDENT_AMBULATORY_CARE_PROVIDER_SITE_OTHER): Payer: Medicare PPO

## 2022-04-06 DIAGNOSIS — N1832 Chronic kidney disease, stage 3b: Secondary | ICD-10-CM

## 2022-04-06 LAB — BASIC METABOLIC PANEL
BUN: 31 mg/dL — ABNORMAL HIGH (ref 6–23)
CO2: 31 mEq/L (ref 19–32)
Calcium: 10 mg/dL (ref 8.4–10.5)
Chloride: 99 mEq/L (ref 96–112)
Creatinine, Ser: 1.71 mg/dL — ABNORMAL HIGH (ref 0.40–1.20)
GFR: 30.83 mL/min — ABNORMAL LOW (ref >60.00)
Glucose, Bld: 90 mg/dL (ref 70–99)
Potassium: 3.9 mEq/L (ref 3.5–5.1)
Sodium: 141 mEq/L (ref 135–145)

## 2022-04-13 ENCOUNTER — Encounter: Payer: Self-pay | Admitting: Family Medicine

## 2022-04-13 DIAGNOSIS — N1832 Chronic kidney disease, stage 3b: Secondary | ICD-10-CM

## 2022-04-16 DIAGNOSIS — G4733 Obstructive sleep apnea (adult) (pediatric): Secondary | ICD-10-CM | POA: Diagnosis not present

## 2022-04-25 ENCOUNTER — Ambulatory Visit
Admission: RE | Admit: 2022-04-25 | Discharge: 2022-04-25 | Disposition: A | Discharge status: Home / Self Care | Payer: Medicare PPO | Source: Ambulatory Visit | Attending: Obstetrics and Gynecology | Admitting: Obstetrics and Gynecology

## 2022-04-25 DIAGNOSIS — Z1231 Encounter for screening mammogram for malignant neoplasm of breast: Secondary | ICD-10-CM | POA: Diagnosis not present

## 2022-04-27 ENCOUNTER — Other Ambulatory Visit: Payer: Self-pay | Admitting: Obstetrics and Gynecology

## 2022-04-27 DIAGNOSIS — R928 Other abnormal and inconclusive findings on diagnostic imaging of breast: Secondary | ICD-10-CM

## 2022-05-04 ENCOUNTER — Other Ambulatory Visit (INDEPENDENT_AMBULATORY_CARE_PROVIDER_SITE_OTHER): Payer: Medicare PPO

## 2022-05-04 DIAGNOSIS — N1832 Chronic kidney disease, stage 3b: Secondary | ICD-10-CM | POA: Diagnosis not present

## 2022-05-04 LAB — BASIC METABOLIC PANEL
BUN: 32 mg/dL — ABNORMAL HIGH (ref 6–23)
CO2: 33 mEq/L — ABNORMAL HIGH (ref 19–32)
Calcium: 10.1 mg/dL (ref 8.4–10.5)
Chloride: 99 mEq/L (ref 96–112)
Creatinine, Ser: 1.81 mg/dL — ABNORMAL HIGH (ref 0.40–1.20)
GFR: 28.78 mL/min — ABNORMAL LOW (ref >60.00)
Glucose, Bld: 89 mg/dL (ref 70–99)
Potassium: 4.3 mEq/L (ref 3.5–5.1)
Sodium: 142 mEq/L (ref 135–145)

## 2022-05-05 ENCOUNTER — Encounter: Payer: Self-pay | Admitting: Family Medicine

## 2022-05-06 ENCOUNTER — Encounter: Payer: Self-pay | Admitting: Family Medicine

## 2022-05-07 ENCOUNTER — Other Ambulatory Visit (HOSPITAL_COMMUNITY): Payer: Self-pay | Admitting: Internal Medicine

## 2022-05-07 ENCOUNTER — Other Ambulatory Visit: Payer: Self-pay | Admitting: Family Medicine

## 2022-05-07 DIAGNOSIS — R7989 Other specified abnormal findings of blood chemistry: Secondary | ICD-10-CM

## 2022-05-07 DIAGNOSIS — I10 Essential (primary) hypertension: Secondary | ICD-10-CM

## 2022-05-08 ENCOUNTER — Ambulatory Visit
Admission: RE | Admit: 2022-05-08 | Discharge: 2022-05-08 | Disposition: A | Discharge status: Home / Self Care | Payer: Medicare PPO | Source: Ambulatory Visit | Attending: Obstetrics and Gynecology | Admitting: Obstetrics and Gynecology

## 2022-05-08 DIAGNOSIS — R928 Other abnormal and inconclusive findings on diagnostic imaging of breast: Secondary | ICD-10-CM

## 2022-05-08 DIAGNOSIS — R922 Inconclusive mammogram: Secondary | ICD-10-CM | POA: Diagnosis not present

## 2022-05-08 DIAGNOSIS — N6012 Diffuse cystic mastopathy of left breast: Secondary | ICD-10-CM | POA: Diagnosis not present

## 2022-05-08 NOTE — Telephone Encounter (Signed)
Labs have been faxed.

## 2022-05-16 ENCOUNTER — Telehealth: Payer: Self-pay | Admitting: Family Medicine

## 2022-05-16 NOTE — Telephone Encounter (Signed)
Contacted Kathleen [name] to schedule their annual wellness visit. Appointment made for 06/01/2022.  Verlee Rossetti; Care Guide Ambulatory Clinical Support [place] [facility] Direct Dial: [phone]

## 2022-05-17 DIAGNOSIS — G4733 Obstructive sleep apnea (adult) (pediatric): Secondary | ICD-10-CM | POA: Diagnosis not present

## 2022-05-18 ENCOUNTER — Encounter: Payer: Self-pay | Admitting: *Deleted

## 2022-05-18 ENCOUNTER — Encounter: Payer: Self-pay | Admitting: Cardiology

## 2022-05-18 ENCOUNTER — Telehealth (HOSPITAL_COMMUNITY): Payer: Self-pay

## 2022-05-18 DIAGNOSIS — N183 Chronic kidney disease, stage 3 unspecified: Secondary | ICD-10-CM | POA: Diagnosis not present

## 2022-05-18 NOTE — Telephone Encounter (Signed)
Agree with taking extra diuretic. Would like to know if she is having any other HF symptoms. If no response to extra dose of lasix will need to be seen for follow-up.

## 2022-05-18 NOTE — Telephone Encounter (Signed)
Patient left message that she gained 4.8lbs overnight. She is taking an extra Lasix today, but wanted to make sure she did not need to do anything else. I tried to call her back to get further information, but no answer. I can try her again in the morning, just wanted to make sure someone was aware.

## 2022-05-19 NOTE — Telephone Encounter (Signed)
I spoke to patient and she has not weighed herself yet this morning, but she said she is feeling better. No swelling or shortness of breath.

## 2022-05-22 ENCOUNTER — Ambulatory Visit: Payer: Medicare PPO | Admitting: Student

## 2022-05-25 DIAGNOSIS — E1122 Type 2 diabetes mellitus with diabetic chronic kidney disease: Secondary | ICD-10-CM | POA: Diagnosis not present

## 2022-05-25 DIAGNOSIS — F411 Generalized anxiety disorder: Secondary | ICD-10-CM | POA: Diagnosis not present

## 2022-05-25 DIAGNOSIS — D631 Anemia in chronic kidney disease: Secondary | ICD-10-CM | POA: Diagnosis not present

## 2022-05-25 DIAGNOSIS — I129 Hypertensive chronic kidney disease with stage 1 through stage 4 chronic kidney disease, or unspecified chronic kidney disease: Secondary | ICD-10-CM | POA: Diagnosis not present

## 2022-05-25 DIAGNOSIS — N184 Chronic kidney disease, stage 4 (severe): Secondary | ICD-10-CM | POA: Diagnosis not present

## 2022-05-25 DIAGNOSIS — E210 Primary hyperparathyroidism: Secondary | ICD-10-CM | POA: Diagnosis not present

## 2022-05-25 DIAGNOSIS — F3112 Bipolar disorder, current episode manic without psychotic features, moderate: Secondary | ICD-10-CM | POA: Diagnosis not present

## 2022-06-01 ENCOUNTER — Ambulatory Visit (INDEPENDENT_AMBULATORY_CARE_PROVIDER_SITE_OTHER): Payer: Medicare PPO | Admitting: *Deleted

## 2022-06-01 VITALS — BP 100/67 | HR 77 | Ht 71.0 in | Wt 236.8 lb

## 2022-06-01 DIAGNOSIS — Z0000 Encounter for general adult medical examination without abnormal findings: Secondary | ICD-10-CM

## 2022-06-01 DIAGNOSIS — H9193 Unspecified hearing loss, bilateral: Secondary | ICD-10-CM | POA: Diagnosis not present

## 2022-06-01 NOTE — Patient Instructions (Signed)
Kathleen [name] , Thank you for taking time to come for your Medicare Wellness Visit. I appreciate your ongoing commitment to your health goals. Please review the following plan we discussed and let me know if I can assist you in the future.   These are the goals we discussed:  Goals   None     This is a list of the screening recommended for you and due dates:  Health Maintenance  Topic Date Due   COVID-19 Vaccine (10 - 2023-24 season) 01/16/2022   Complete foot exam   03/31/2022   Flu Shot  08/24/2022   Hemoglobin A1C  09/30/2022   Eye exam for diabetics  11/16/2022   Yearly kidney health urinalysis for diabetes  03/30/2023   Mammogram  04/25/2023   Yearly kidney function blood test for diabetes  05/04/2023   Medicare Annual Wellness Visit  06/01/2023   DTaP/Tdap/Td vaccine (4 - Td or Tdap) 10/09/2028   Pneumonia Vaccine  Completed   DEXA scan (bone density measurement)  Completed   Hepatitis C Screening: USPSTF Recommendation to screen - Ages 16-79 yo.  Completed   Zoster (Shingles) Vaccine  Completed   HPV Vaccine  Aged Out   Colon Cancer Screening  Discontinued   Cologuard (Stool DNA test)  Discontinued     Next appointment: Follow up in one year for your annual wellness visit.   Preventive Care 22 Years and Older, Female Preventive care refers to lifestyle choices and visits with your health care provider that can promote health and wellness. What does preventive care include? A yearly physical exam. This is also called an annual well check. Dental exams once or twice a year. Routine eye exams. Ask your health care provider how often you should have your eyes checked. Personal lifestyle choices, including: Daily care of your teeth and gums. Regular physical activity. Eating a healthy diet. Avoiding tobacco and drug use. Limiting alcohol use. Practicing safe sex. Taking low-dose aspirin every day. Taking vitamin and mineral supplements as recommended by your health care  provider. What happens during an annual well check? The services and screenings done by your health care provider during your annual well check will depend on your age, overall health, lifestyle risk factors, and family history of disease. Counseling  Your health care provider may ask you questions about your: Alcohol use. Tobacco use. Drug use. Emotional well-being. Home and relationship well-being. Sexual activity. Eating habits. History of falls. Memory and ability to understand (cognition). Work and work Astronomer. Reproductive health. Screening  You may have the following tests or measurements: Height, weight, and BMI. Blood pressure. Lipid and cholesterol levels. These may be checked every 5 years, or more frequently if you are over 19 years old. Skin check. Lung cancer screening. You may have this screening every year starting at age 83 if you have a 30-pack-year history of smoking and currently smoke or have quit within the past 15 years. Fecal occult blood test (FOBT) of the stool. You may have this test every year starting at age 68. Flexible sigmoidoscopy or colonoscopy. You may have a sigmoidoscopy every 5 years or a colonoscopy every 10 years starting at age 41. Hepatitis C blood test. Hepatitis B blood test. Sexually transmitted disease (STD) testing. Diabetes screening. This is done by checking your blood sugar (glucose) after you have not eaten for a while (fasting). You may have this done every 1-3 years. Bone density scan. This is done to screen for osteoporosis. You may have this done  starting at age 42. Mammogram. This may be done every 1-2 years. Talk to your health care provider about how often you should have regular mammograms. Talk with your health care provider about your test results, treatment options, and if necessary, the need for more tests. Vaccines  Your health care provider may recommend certain vaccines, such as: Influenza vaccine. This is  recommended every year. Tetanus, diphtheria, and acellular pertussis (Tdap, Td) vaccine. You may need a Td booster every 10 years. Zoster vaccine. You may need this after age 44. Pneumococcal 13-valent conjugate (PCV13) vaccine. One dose is recommended after age 56. Pneumococcal polysaccharide (PPSV23) vaccine. One dose is recommended after age 51. Talk to your health care provider about which screenings and vaccines you need and how often you need them. This information is not intended to replace advice given to you by your health care provider. Make sure you discuss any questions you have with your health care provider. Document Released: 02/05/2015 Document Revised: 09/29/2015 Document Reviewed: 11/10/2014 Elsevier Interactive Patient Education  2017 [employer] Prevention in the Home Falls can cause injuries. They can happen to people of all ages. There are many things you can do to make your home safe and to help prevent falls. What can I do on the outside of my home? Regularly fix the edges of walkways and driveways and fix any cracks. Remove anything that might make you trip as you walk through a door, such as a raised step or threshold. Trim any bushes or trees on the path to your home. Use bright outdoor lighting. Clear any walking paths of anything that might make someone trip, such as rocks or tools. Regularly check to see if handrails are loose or broken. Make sure that both sides of any steps have handrails. Any raised decks and porches should have guardrails on the edges. Have any leaves, snow, or ice cleared regularly. Use sand or salt on walking paths during winter. Clean up any spills in your garage right away. This includes oil or grease spills. What can I do in the bathroom? Use night lights. Install grab bars by the toilet and in the tub and shower. Do not use towel bars as grab bars. Use non-skid mats or decals in the tub or shower. If you need to sit down in  the shower, use a plastic, non-slip stool. Keep the floor dry. Clean up any water that spills on the floor as soon as it happens. Remove soap buildup in the tub or shower regularly. Attach bath mats securely with double-sided non-slip rug tape. Do not have throw rugs and other things on the floor that can make you trip. What can I do in the bedroom? Use night lights. Make sure that you have a light by your bed that is easy to reach. Do not use any sheets or blankets that are too big for your bed. They should not hang down onto the floor. Have a firm chair that has side arms. You can use this for support while you get dressed. Do not have throw rugs and other things on the floor that can make you trip. What can I do in the kitchen? Clean up any spills right away. Avoid walking on wet floors. Keep items that you use a lot in easy-to-reach places. If you need to reach something above you, use a strong step stool that has a grab bar. Keep electrical cords out of the way. Do not use floor polish or wax that  makes floors slippery. If you must use wax, use non-skid floor wax. Do not have throw rugs and other things on the floor that can make you trip. What can I do with my stairs? Do not leave any items on the stairs. Make sure that there are handrails on both sides of the stairs and use them. Fix handrails that are broken or loose. Make sure that handrails are as long as the stairways. Check any carpeting to make sure that it is firmly attached to the stairs. Fix any carpet that is loose or worn. Avoid having throw rugs at the top or bottom of the stairs. If you do have throw rugs, attach them to the floor with carpet tape. Make sure that you have a light switch at the top of the stairs and the bottom of the stairs. If you do not have them, ask someone to add them for you. What else can I do to help prevent falls? Wear shoes that: Do not have high heels. Have rubber bottoms. Are comfortable  and fit you well. Are closed at the toe. Do not wear sandals. If you use a stepladder: Make sure that it is fully opened. Do not climb a closed stepladder. Make sure that both sides of the stepladder are locked into place. Ask someone to hold it for you, if possible. Clearly mark and make sure that you can see: Any grab bars or handrails. First and last steps. Where the edge of each step is. Use tools that help you move around (mobility aids) if they are needed. These include: Canes. Walkers. Scooters. Crutches. Turn on the lights when you go into a dark area. Replace any light bulbs as soon as they burn out. Set up your furniture so you have a clear path. Avoid moving your furniture around. If any of your floors are uneven, fix them. If there are any pets around you, be aware of where they are. Review your medicines with your doctor. Some medicines can make you feel dizzy. This can increase your chance of falling. Ask your doctor what other things that you can do to help prevent falls. This information is not intended to replace advice given to you by your health care provider. Make sure you discuss any questions you have with your health care provider. Document Released: 11/05/2008 Document Revised: 06/17/2015 Document Reviewed: 02/13/2014 Elsevier Interactive Patient Education  2017 [employer].
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
## 2022-06-05 ENCOUNTER — Ambulatory Visit (HOSPITAL_BASED_OUTPATIENT_CLINIC_OR_DEPARTMENT_OTHER): Payer: Medicare PPO | Attending: Student | Admitting: Cardiology

## 2022-06-05 ENCOUNTER — Telehealth: Payer: Self-pay

## 2022-06-05 VITALS — BP 104/82 | HR 81 | Ht 70.0 in | Wt 234.0 lb

## 2022-06-05 DIAGNOSIS — I480 Paroxysmal atrial fibrillation: Secondary | ICD-10-CM

## 2022-06-05 DIAGNOSIS — I483 Typical atrial flutter: Secondary | ICD-10-CM | POA: Diagnosis not present

## 2022-06-05 DIAGNOSIS — G4733 Obstructive sleep apnea (adult) (pediatric): Secondary | ICD-10-CM

## 2022-06-05 DIAGNOSIS — Z01812 Encounter for preprocedural laboratory examination: Secondary | ICD-10-CM | POA: Diagnosis not present

## 2022-06-05 DIAGNOSIS — D6869 Other thrombophilia: Secondary | ICD-10-CM

## 2022-06-05 NOTE — Progress Notes (Signed)
Electrophysiology Office Note   Date:  06/05/2022   ID:  Kathleen [name], DOB February 17, 1955, MRN [id number]  PCP:  Pearline Cables, MD  Cardiologist: Bensimhon Primary Electrophysiologist:  Patsye Sullivant Jorja Loa, MD    Chief Complaint: Atrial fibrillation   History of Present Illness: Kathleen [name] is a 67 y.o. female who is being seen today for the evaluation of atrial fibrillation at the request of [name], Kathleen Found, MD. Presenting today for electrophysiology evaluation.  She has a history of diastolic heart failure, hypertension, hyperlipidemia, coronary artery disease, obstructive sleep apnea, atrial fibrillation.  She is post atrial fibrillation ablation 12/06/2021.  She unfortunately has had episodes of atrial flutter.  She is now on amiodarone.  She has plans for ablation 06/13/2022.  Today, denies symptoms of palpitations, chest pain, shortness of breath, orthopnea, PND, lower extremity edema, claudication, dizziness, presyncope, syncope, bleeding, or neurologic sequela. The patient is tolerating medications without difficulties.    Past Medical History:  Diagnosis Date   Anemia    Anxiety    Asthma    Atrial fibrillation (HCC)    Bipolar disorder (HCC)    CAD (coronary artery disease)    Cancer (HCC)    basal cell carcinoma on right hand, papilloma left breast   CHF (congestive heart failure) (HCC)    pt seen at heart/vascular spec clinic 08/14/2014    Chronic kidney disease    Coarse tremors    hands   Coronary artery disease    Depression    DVT (deep vein thrombosis) in pregnancy    pt. 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HCC) 02/2007   non-q-wave with second septal perforator   Hyperlipidemia    Hypertension    Hypothyroidism    history of took medication, has resolved   Knee pain, left    Lower extremity deep venous  thrombosis (HCC)    20 years ago    Measles    hx of in childhood    Morbid obesity with BMI of 40.0-44.9, adult (HCC)    Pain at surgical incision    Left breast from procedure 09/11/16   Pneumonia    hx of    PONV (postoperative nausea and vomiting)    also slow to wake up   Psychiatric hospitalization 05/2008   Shingles    20 years ago    Shortness of breath dyspnea    exercise    Type 2 diabetes mellitus (HCC) 12/31/2015   Urinary incontinence    UTI (urinary tract infection)    Past Surgical History:  Procedure Laterality Date   ATRIAL FIBRILLATION ABLATION N/A 12/06/2021   Procedure: ATRIAL FIBRILLATION ABLATION;  Surgeon: Regan Lemming, MD;  [facility]: [facility];  Service: Cardiovascular;  Laterality: N/A;   BASAL CELL CARCINOMA EXCISION     BREAST EXCISIONAL BIOPSY Left 2018   BREAST LUMPECTOMY WITH RADIOACTIVE SEED LOCALIZATION Left 09/11/2016   Procedure: LEFT BREAST LUMPECTOMY WITH RADIOACTIVE SEED LOCALIZATION;  Surgeon: Glenna Fellows, MD;  Location: [facility];  Service: General;  Laterality: Left;   BREAST MASS EXCISION     age 31, benign tumor   CARDIAC CATHETERIZATION     CARDIAC CATHETERIZATION N/A 12/30/2015   Procedure: Left Heart Cath and Coronary Angiography;  Surgeon: Lyn Records, MD;  Location: [facility]  INVASIVE CV LAB;  Service: Cardiovascular;  Laterality: N/A;   COLONOSCOPY  07/15/2009   [facility]   DILATATION & CURETTAGE/HYSTEROSCOPY WITH MYOSURE N/A 09/22/2016   Procedure: DILATATION & CURETTAGE/HYSTEROSCOPY WITH MYOSURE;  Surgeon: Richarda Overlie, MD;  Location: [facility];  Service: Gynecology;  Laterality: N/A;   DILATION AND CURETTAGE OF UTERUS     KNEE SURGERY     right, removed cartilage   PARATHYROIDECTOMY N/A 08/25/2014   Procedure: PARATHYROIDECTOMY;  Surgeon: Darnell Level, MD;  Location: [facility];  Service: General;  Laterality: N/A;   SHOULDER ARTHROSCOPY WITH ROTATOR CUFF REPAIR AND SUBACROMIAL DECOMPRESSION Right 02/27/2017    Procedure: Right shoulder arthroscopic rotator cuff repair with biceps tenodesis and subacromial decompression;  Surgeon: Yolonda Kida, MD;  Location: [facility] OR;  Service: Orthopedics;  Laterality: Right;  150 mins   TONSILLECTOMY     TUBAL LIGATION       Current Outpatient Medications  Medication Sig Dispense Refill   ACCU-CHEK GUIDE test strip USE AS INSTRUCTED 300 strip 3   Accu-Chek Softclix Lancets lancets USE AS DIRECTED 300 each 3   allopurinol (ZYLOPRIM) 100 MG tablet TAKE TWO TABLETS BY MOUTH DAILY 180 tablet 1   benztropine (COGENTIN) 1 MG tablet Take 1 mg by mouth 2 (two) times daily.  2   Biotin w/ Vitamins C & E (HAIR/SKIN/NAILS PO) Take 1 tablet by mouth daily.     Blood Glucose Calibration (ACCU-CHEK GUIDE CONTROL) LIQD 1 drop by In Vitro route as directed. E11.9 1 each 1   Blood Glucose Monitoring Suppl (ACCU-CHEK GUIDE ME) w/Device KIT 1 each by Does not apply route in the morning and at bedtime. E11.9 1 kit 0   calcitRIOL (ROCALTROL) 0.25 MCG capsule TAKE 1 CAPSULE BY MOUTH DAILY 90 capsule 1   Cholecalciferol (VITAMIN D3) 50 MCG (2000 UT) capsule Take 2,000 Units by mouth daily.     divalproex (DEPAKOTE ER) 500 MG 24 hr tablet Take 1,000 mg by mouth at bedtime.     furosemide (LASIX) 80 MG tablet TAKE ONE TABLET BY MOUTH EVERY MORNING AND ONE-HALF TABLET BY MOUTH IN THE AFTERNOON 135 tablet 3   JARDIANCE 10 MG TABS tablet TAKE ONE TABLET BY MOUTH DAILY 90 tablet 3   LATUDA 60 MG TABS Take 60 mg by mouth at bedtime.      levalbuterol (XOPENEX HFA) 45 MCG/ACT inhaler Inhale 2 puffs into the lungs every 4 (four) hours as needed for wheezing. 1 Inhaler 1   levothyroxine (SYNTHROID) 50 MCG tablet TAKE ONE TABLET BY MOUTH DAILY BEFORE BREAKFAST 90 tablet 1   metoprolol tartrate (LOPRESSOR) 25 MG tablet Take 25 mg by mouth 2 (two) times daily.     nitroGLYCERIN (NITROSTAT) 0.4 MG SL tablet Place 1 tablet (0.4 mg total) under the tongue every 5 (five) minutes as needed for  chest pain. For chest pain 25 tablet 3   NON FORMULARY Place 1 drop into both eyes 2 (two) times daily. Regener-Eyes eye drops     OLANZapine (ZYPREXA) 15 MG tablet Take 15 mg by mouth at bedtime.     rosuvastatin (CRESTOR) 20 MG tablet TAKE ONE TABLET BY MOUTH DAILY 90 tablet 2   tirzepatide (MOUNJARO) 10 MG/0.5ML Pen Inject 10 mg into the skin once a week. 6 mL 1   XARELTO 20 MG TABS tablet TAKE ONE TABLET BY MOUTH AT BEDTIME 90 tablet 3   No current facility-administered medications for this visit.    Allergies:   Tetanus-diphtheria toxoids td, Influenza  vaccines, Tetanus toxoids, and Lamictal [lamotrigine]   Social History:  The patient  reports that she has never smoked. She has been exposed to tobacco smoke. She has never used smokeless tobacco. She reports current alcohol use. She reports that she does not use drugs.   Family History:  The patient's family history includes Alcohol abuse in her father, maternal grandfather, maternal grandmother, mother, paternal grandfather, and paternal grandmother; Alzheimer's disease in her father and mother; Breast cancer (age of onset: 23) in her mother; Diabetes in her brother; Drug abuse in her maternal grandfather; Hypertension in her brother; Lung cancer in her mother; Parkinson's disease in her father; Schizophrenia in her cousin.   ROS:  Please see the history of present illness.   Otherwise, review of systems is positive for none.   All other systems are reviewed and negative.   PHYSICAL EXAM: VS:  BP 104/82   Pulse 81   Ht 5\' 10"  (1.778 m)   Wt 234 lb 0.6 oz (106.2 kg)   SpO2 95%   BMI 33.58 kg/m  , BMI Body mass index is 33.58 kg/m. GEN: Well nourished, well developed, in no acute distress  HEENT: normal  Neck: no JVD, carotid bruits, or masses Cardiac: RRR; no murmurs, rubs, or gallops,no edema  Respiratory:  clear to auscultation bilaterally, normal work of breathing GI: soft, nontender, nondistended, + BS MS: no deformity or  atrophy  Skin: warm and dry Neuro:  Strength and sensation are intact Psych: euthymic mood, full affect  EKG:  EKG is ordered today. Personal review of the ekg ordered shows sinus rhythm, rate 81  Recent Labs: 01/30/2022: B Natriuretic Peptide 54.6; Magnesium 2.2 02/05/2022: Hemoglobin 14.8; Platelets 144 03/30/2022: ALT 38; TSH 4.67 05/04/2022: BUN 32; Creatinine, Ser 1.81; Potassium 4.3; Sodium 142    Lipid Panel     Component Value Date/Time   CHOL 124 03/30/2022 1034   TRIG 96.0 03/30/2022 1034   HDL 51.90 03/30/2022 1034   CHOLHDL 2 03/30/2022 1034   VLDL 19.2 03/30/2022 1034   LDLCALC 53 03/30/2022 1034   LDLDIRECT 166.0 08/03/2016 0834     Wt Readings from Last 3 Encounters:  06/05/22 234 lb 0.6 oz (106.2 kg)  06/01/22 236 lb 12.8 oz (107.4 kg)  03/30/22 244 lb 6.4 oz (110.9 kg)      Other studies Reviewed: Additional studies/ records that were reviewed today include: TTE 02/15/21  Review of the above records today demonstrates:   1. Left ventricular ejection fraction, by estimation, is 55 to 60%. The  left ventricle has normal function. The left ventricle has no regional  wall motion abnormalities. Left ventricular diastolic parameters are  consistent with Grade I diastolic  dysfunction (impaired relaxation). The average left ventricular global  longitudinal strain is -21.6 %. The global longitudinal strain is normal.   2. Right ventricular systolic function is mildly reduced. The right  ventricular size is normal. There is normal pulmonary artery systolic  pressure.   3. The mitral valve is normal in structure. No evidence of mitral valve  regurgitation. No evidence of mitral stenosis.   4. Tricuspid valve regurgitation is mild to moderate.   5. The aortic valve is normal in structure. Aortic valve regurgitation is  mild. No aortic stenosis is present.   6. The inferior vena cava is normal in size with greater than 50%  respiratory variability, suggesting right  atrial pressure of 3 mmHg.   Cardiac monitor 06/21/2021 personally reviewed 1. Sinus rhythm -  avg  HR of 81 bpm. 2. Three runs of Supraventricular Tachycardia occurred, the run with the fastest interval lasting 22.4 secs with a max rate of 148 bpm (avg 111bpm); the run with the fastest interval was also the longest.  3. Atrial Fibrillation/Flutter occurred (15% burden), ranging from 72-201 bpm (avg of 128  bpm), the longest lasting 12 hours 1 min with an avg rate of 117 bpm.  4. Rare PACs and PVCs 5. Multiple patient-triggered events associated with sinus rhythm, isolated PACs, isolated PVCs and/or AF.   ASSESSMENT AND PLAN:  1.  Paroxysmal atrial fibrillation/flutter: CHA2DS2-VASc of 6.  Currently on Xarelto.  Status post ablation 12/06/2021.  She is unfortunately had further episodes of atrial fibrillation and atrial flutter.  She has plans for ablation 06/13/2022.  If she is in atrial fibrillation or flutter on the day of the procedure she Master Touchet Dak Szumski potentially need TEE to rule out thrombus as her creatinine is elevated and she cannot get a CT.  All questions were answered today.  2.  Secondary hypercoagulable state: Currently on Xarelto for atrial fibrillation  3.  Obstructive sleep apnea: Monitored by sleep medicine  4.  Obesity: Lifestyle modification encouraged Body mass index is 33.58 kg/m.  5.  Chronic diastolic heart failure: Appears euvolemic.  Plan per heart failure cardiology  6.  Atrial flutter: Appears typical and EKG.  Plans for ablation as above.   Current medicines are reviewed at length with the patient today.   The patient does not have concerns regarding her medicines.  The following changes were made today: none  Labs/ tests ordered today include:  Orders Placed This Encounter  Procedures   Basic metabolic panel   CBC   EKG 12-Lead     Disposition:   FU 3 months  Signed, Cecely Rengel Jorja Loa, MD  06/05/2022 3:15 PM     [facility] [address] [place] [address] [phone] (office) [phone] (fax)

## 2022-06-05 NOTE — Telephone Encounter (Signed)
Pt aware of lab results and that we have cancelled her CT. She is now scheduled for a TEE on 5/21 @ 7:30.   This is the same day as her Ablation procedure that is scheduled for 10:30.

## 2022-06-05 NOTE — H&P (View-Only) (Signed)
 Electrophysiology Office Note   Date:  06/05/2022   ID:  Kathleen [name], DOB 04/26/1955, MRN [id number]  PCP:  [name], Jessica C, MD  Cardiologist: Bensimhon Primary Electrophysiologist:  Phillp Dolores Martin [name], MD    Chief Complaint: Atrial fibrillation   History of Present Illness: Kathleen [name] is a 66 y.o. female who is being seen today for the evaluation of atrial fibrillation at the request of [name], Jessica C, MD. Presenting today for electrophysiology evaluation.  She has a history of diastolic heart failure, hypertension, hyperlipidemia, coronary artery disease, obstructive sleep apnea, atrial fibrillation.  She is post atrial fibrillation ablation 12/06/2021.  She unfortunately has had episodes of atrial flutter.  She is now on amiodarone.  She has plans for ablation 06/13/2022.  Today, denies symptoms of palpitations, chest pain, shortness of breath, orthopnea, PND, lower extremity edema, claudication, dizziness, presyncope, syncope, bleeding, or neurologic sequela. The patient is tolerating medications without difficulties.    Past Medical History:  Diagnosis Date   Anemia    Anxiety    Asthma    Atrial fibrillation (HCC)    Bipolar disorder (HCC)    CAD (coronary artery disease)    Cancer (HCC)    basal cell carcinoma on right hand, papilloma left breast   CHF (congestive heart failure) (HCC)    pt seen at heart/vascular spec clinic 08/14/2014    Chronic kidney disease    Coarse tremors    hands   Coronary artery disease    Depression    DVT (deep vein thrombosis) in pregnancy    pt. 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HCC) 02/2007   non-q-wave with second septal perforator   Hyperlipidemia    Hypertension    Hypothyroidism    history of took medication, has resolved   Knee pain, left    Lower extremity deep venous  thrombosis (HCC)    20 years ago    Measles    hx of in childhood    Morbid obesity with BMI of 40.0-44.9, adult (HCC)    Pain at surgical incision    Left breast from procedure 09/11/16   Pneumonia    hx of    PONV (postoperative nausea and vomiting)    also slow to wake up   Psychiatric hospitalization 05/2008   Shingles    20 years ago    Shortness of breath dyspnea    exercise    Type 2 diabetes mellitus (HCC) 12/31/2015   Urinary incontinence    UTI (urinary tract infection)    Past Surgical History:  Procedure Laterality Date   ATRIAL FIBRILLATION ABLATION N/A 12/06/2021   Procedure: ATRIAL FIBRILLATION ABLATION;  Surgeon: Shamone Winzer Martin, MD;  Location: [facility];  Service: Cardiovascular;  Laterality: N/A;   BASAL CELL CARCINOMA EXCISION     BREAST EXCISIONAL BIOPSY Left 2018   BREAST LUMPECTOMY WITH RADIOACTIVE SEED LOCALIZATION Left 09/11/2016   Procedure: LEFT BREAST LUMPECTOMY WITH RADIOACTIVE SEED LOCALIZATION;  Surgeon: Hoxworth, Benjamin, MD;  Location: [facility];  Service: General;  Laterality: Left;   BREAST MASS EXCISION     age 19, benign tumor   CARDIAC CATHETERIZATION     CARDIAC CATHETERIZATION N/A 12/30/2015   Procedure: Left Heart Cath and Coronary Angiography;  Surgeon: Henry W Smith, MD;  Location: [facility]   INVASIVE CV LAB;  Service: Cardiovascular;  Laterality: N/A;   COLONOSCOPY  07/15/2009   [facility]   DILATATION & CURETTAGE/HYSTEROSCOPY WITH MYOSURE N/A 09/22/2016   Procedure: DILATATION & CURETTAGE/HYSTEROSCOPY WITH MYOSURE;  Surgeon: Holland, Richard, MD;  Location: [facility];  Service: Gynecology;  Laterality: N/A;   DILATION AND CURETTAGE OF UTERUS     KNEE SURGERY     right, removed cartilage   PARATHYROIDECTOMY N/A 08/25/2014   Procedure: PARATHYROIDECTOMY;  Surgeon: Todd Gerkin, MD;  Location: [facility];  Service: General;  Laterality: N/A;   SHOULDER ARTHROSCOPY WITH ROTATOR CUFF REPAIR AND SUBACROMIAL DECOMPRESSION Right 02/27/2017    Procedure: Right shoulder arthroscopic rotator cuff repair with biceps tenodesis and subacromial decompression;  Surgeon: Rogers, Jason Patrick, MD;  Location: [facility];  Service: Orthopedics;  Laterality: Right;  150 mins   TONSILLECTOMY     TUBAL LIGATION       Current Outpatient Medications  Medication Sig Dispense Refill   ACCU-CHEK GUIDE test strip USE AS INSTRUCTED 300 strip 3   Accu-Chek Softclix Lancets lancets USE AS DIRECTED 300 each 3   allopurinol (ZYLOPRIM) 100 MG tablet TAKE TWO TABLETS BY MOUTH DAILY 180 tablet 1   benztropine (COGENTIN) 1 MG tablet Take 1 mg by mouth 2 (two) times daily.  2   Biotin w/ Vitamins C & E (HAIR/SKIN/NAILS PO) Take 1 tablet by mouth daily.     Blood Glucose Calibration (ACCU-CHEK GUIDE CONTROL) LIQD 1 drop by In Vitro route as directed. E11.9 1 each 1   Blood Glucose Monitoring Suppl (ACCU-CHEK GUIDE ME) w/Device KIT 1 each by Does not apply route in the morning and at bedtime. E11.9 1 kit 0   calcitRIOL (ROCALTROL) 0.25 MCG capsule TAKE 1 CAPSULE BY MOUTH DAILY 90 capsule 1   Cholecalciferol (VITAMIN D3) 50 MCG (2000 UT) capsule Take 2,000 Units by mouth daily.     divalproex (DEPAKOTE ER) 500 MG 24 hr tablet Take 1,000 mg by mouth at bedtime.     furosemide (LASIX) 80 MG tablet TAKE ONE TABLET BY MOUTH EVERY MORNING AND ONE-HALF TABLET BY MOUTH IN THE AFTERNOON 135 tablet 3   JARDIANCE 10 MG TABS tablet TAKE ONE TABLET BY MOUTH DAILY 90 tablet 3   LATUDA 60 MG TABS Take 60 mg by mouth at bedtime.      levalbuterol (XOPENEX HFA) 45 MCG/ACT inhaler Inhale 2 puffs into the lungs every 4 (four) hours as needed for wheezing. 1 Inhaler 1   levothyroxine (SYNTHROID) 50 MCG tablet TAKE ONE TABLET BY MOUTH DAILY BEFORE BREAKFAST 90 tablet 1   metoprolol tartrate (LOPRESSOR) 25 MG tablet Take 25 mg by mouth 2 (two) times daily.     nitroGLYCERIN (NITROSTAT) 0.4 MG SL tablet Place 1 tablet (0.4 mg total) under the tongue every 5 (five) minutes as needed for  chest pain. For chest pain 25 tablet 3   NON FORMULARY Place 1 drop into both eyes 2 (two) times daily. Regener-Eyes eye drops     OLANZapine (ZYPREXA) 15 MG tablet Take 15 mg by mouth at bedtime.     rosuvastatin (CRESTOR) 20 MG tablet TAKE ONE TABLET BY MOUTH DAILY 90 tablet 2   tirzepatide (MOUNJARO) 10 MG/0.5ML Pen Inject 10 mg into the skin once a week. 6 mL 1   XARELTO 20 MG TABS tablet TAKE ONE TABLET BY MOUTH AT BEDTIME 90 tablet 3   No current facility-administered medications for this visit.    Allergies:   Tetanus-diphtheria toxoids td, Influenza   vaccines, Tetanus toxoids, and Lamictal [lamotrigine]   Social History:  The patient  reports that she has never smoked. She has been exposed to tobacco smoke. She has never used smokeless tobacco. She reports current alcohol use. She reports that she does not use drugs.   Family History:  The patient's family history includes Alcohol abuse in her father, maternal grandfather, maternal grandmother, mother, paternal grandfather, and paternal grandmother; Alzheimer's disease in her father and mother; Breast cancer (age of onset: 52) in her mother; Diabetes in her brother; Drug abuse in her maternal grandfather; Hypertension in her brother; Lung cancer in her mother; Parkinson's disease in her father; Schizophrenia in her cousin.   ROS:  Please see the history of present illness.   Otherwise, review of systems is positive for none.   All other systems are reviewed and negative.   PHYSICAL EXAM: VS:  BP 104/82   Pulse 81   Ht 5' 10" (1.778 m)   Wt 234 lb 0.6 oz (106.2 kg)   SpO2 95%   BMI 33.58 kg/m  , BMI Body mass index is 33.58 kg/m. GEN: Well nourished, well developed, in no acute distress  HEENT: normal  Neck: no JVD, carotid bruits, or masses Cardiac: RRR; no murmurs, rubs, or gallops,no edema  Respiratory:  clear to auscultation bilaterally, normal work of breathing GI: soft, nontender, nondistended, + BS MS: no deformity or  atrophy  Skin: warm and dry Neuro:  Strength and sensation are intact Psych: euthymic mood, full affect  EKG:  EKG is ordered today. Personal review of the ekg ordered shows sinus rhythm, rate 81  Recent Labs: 01/30/2022: B Natriuretic Peptide 54.6; Magnesium 2.2 02/05/2022: Hemoglobin 14.8; Platelets 144 03/30/2022: ALT 38; TSH 4.67 05/04/2022: BUN 32; Creatinine, Ser 1.81; Potassium 4.3; Sodium 142    Lipid Panel     Component Value Date/Time   CHOL 124 03/30/2022 1034   TRIG 96.0 03/30/2022 1034   HDL 51.90 03/30/2022 1034   CHOLHDL 2 03/30/2022 1034   VLDL 19.2 03/30/2022 1034   LDLCALC 53 03/30/2022 1034   LDLDIRECT 166.0 08/03/2016 0834     Wt Readings from Last 3 Encounters:  06/05/22 234 lb 0.6 oz (106.2 kg)  06/01/22 236 lb 12.8 oz (107.4 kg)  03/30/22 244 lb 6.4 oz (110.9 kg)      Other studies Reviewed: Additional studies/ records that were reviewed today include: TTE 02/15/21  Review of the above records today demonstrates:   1. Left ventricular ejection fraction, by estimation, is 55 to 60%. The  left ventricle has normal function. The left ventricle has no regional  wall motion abnormalities. Left ventricular diastolic parameters are  consistent with Grade I diastolic  dysfunction (impaired relaxation). The average left ventricular global  longitudinal strain is -21.6 %. The global longitudinal strain is normal.   2. Right ventricular systolic function is mildly reduced. The right  ventricular size is normal. There is normal pulmonary artery systolic  pressure.   3. The mitral valve is normal in structure. No evidence of mitral valve  regurgitation. No evidence of mitral stenosis.   4. Tricuspid valve regurgitation is mild to moderate.   5. The aortic valve is normal in structure. Aortic valve regurgitation is  mild. No aortic stenosis is present.   6. The inferior vena cava is normal in size with greater than 50%  respiratory variability, suggesting right  atrial pressure of 3 mmHg.   Cardiac monitor 06/21/2021 personally reviewed 1. Sinus rhythm -  avg   HR of 81 bpm. 2. Three runs of Supraventricular Tachycardia occurred, the run with the fastest interval lasting 22.4 secs with a max rate of 148 bpm (avg 111bpm); the run with the fastest interval was also the longest.  3. Atrial Fibrillation/Flutter occurred (15% burden), ranging from 72-201 bpm (avg of 128  bpm), the longest lasting 12 hours 1 min with an avg rate of 117 bpm.  4. Rare PACs and PVCs 5. Multiple patient-triggered events associated with sinus rhythm, isolated PACs, isolated PVCs and/or AF.   ASSESSMENT AND PLAN:  1.  Paroxysmal atrial fibrillation/flutter: CHA2DS2-VASc of 6.  Currently on Xarelto.  Status post ablation 12/06/2021.  She is unfortunately had further episodes of atrial fibrillation and atrial flutter.  She has plans for ablation 06/13/2022.  If she is in atrial fibrillation or flutter on the day of the procedure she Kelsie Kramp Elison Worrel potentially need TEE to rule out thrombus as her creatinine is elevated and she cannot get a CT.  All questions were answered today.  2.  Secondary hypercoagulable state: Currently on Xarelto for atrial fibrillation  3.  Obstructive sleep apnea: Monitored by sleep medicine  4.  Obesity: Lifestyle modification encouraged Body mass index is 33.58 kg/m.  5.  Chronic diastolic heart failure: Appears euvolemic.  Plan per heart failure cardiology  6.  Atrial flutter: Appears typical and EKG.  Plans for ablation as above.   Current medicines are reviewed at length with the patient today.   The patient does not have concerns regarding her medicines.  The following changes were made today: none  Labs/ tests ordered today include:  Orders Placed This Encounter  Procedures   Basic metabolic panel   CBC   EKG 12-Lead     Disposition:   FU 3 months  Signed, Raymond Azure Martin Chasey Dull, MD  06/05/2022 3:15 PM     [facility] [address] [place] [address] [phone] (office) [phone] (fax) 

## 2022-06-05 NOTE — Patient Instructions (Signed)
Medication Instructions:  Your physician recommends that you continue on your current medications as directed. Please refer to the Current Medication list given to you today.  *If you need a refill on your cardiac medications before your next appointment, please call your pharmacy*   Lab Work: Today:  BMET & CBC  If you have labs (blood work) drawn today and your tests are completely normal, you will receive your results only by: MyChart Message (if you have MyChart) OR A paper copy in the mail If you have any lab test that is abnormal or we need to change your treatment, we will call you to review the results.   Testing/Procedures: None ordered   Follow-Up: At [facility], you and your health needs are our priority.  As part of our continuing mission to provide you with exceptional heart care, we have created designated Provider Care Teams.  These Care Teams include your primary Cardiologist (physician) and Advanced Practice Providers (APPs -  Physician Assistants and Nurse Practitioners) who all work together to provide you with the care you need, when you need it.  Your next appointment:   Keep your  scheduled post ablation follow up  The format for your next appointment:   In Person  Provider:   You will follow up in the Atrial Fibrillation Clinic located at [facility]. Your provider will be: Clint R. Fenton, PA-C{  Thank you for choosing [facility]!!   Dory Horn, RN [phone]

## 2022-06-05 NOTE — Telephone Encounter (Signed)
-----   Message from Baird Lyons, RN sent at 06/05/2022 11:04 AM EDT ----- [place] Coupe to EP scheduler to address.  ----- Message ----- From: Lennie Odor, RN Sent: 06/05/2022   9:05 AM EDT To: Regan Lemming, MD; Baird Lyons, RN  Hey guys, her GFR is less than 30 ( 28) .[name] Can we please make arrangements for alternative testing?  Thank you, Rockwell Alexandria

## 2022-06-06 ENCOUNTER — Ambulatory Visit (HOSPITAL_BASED_OUTPATIENT_CLINIC_OR_DEPARTMENT_OTHER): Admission: RE | Admit: 2022-06-06 | Payer: Medicare PPO | Source: Ambulatory Visit

## 2022-06-06 LAB — BASIC METABOLIC PANEL
BUN/Creatinine Ratio: 15 (ref 12–28)
BUN: 30 mg/dL — ABNORMAL HIGH (ref 8–27)
CO2: 28 mmol/L (ref 20–29)
Calcium: 10.1 mg/dL (ref 8.7–10.3)
Chloride: 96 mmol/L (ref 96–106)
Creatinine, Ser: 1.95 mg/dL — ABNORMAL HIGH (ref 0.57–1.00)
Glucose: 92 mg/dL (ref 70–99)
Potassium: 4.2 mmol/L (ref 3.5–5.2)
Sodium: 141 mmol/L (ref 134–144)
eGFR: 28 mL/min/{1.73_m2} — ABNORMAL LOW (ref >59)

## 2022-06-06 LAB — CBC
Hematocrit: 49.6 % — ABNORMAL HIGH (ref 34.0–46.6)
Hemoglobin: 16.6 g/dL — ABNORMAL HIGH (ref 11.1–15.9)
MCH: 30.7 pg (ref 26.6–33.0)
MCHC: 33.5 g/dL (ref 31.5–35.7)
MCV: 92 fL (ref 79–97)
Platelets: 135 10*3/uL — ABNORMAL LOW (ref 150–450)
RBC: 5.4 x10E6/uL — ABNORMAL HIGH (ref 3.77–5.28)
RDW: 14.6 % (ref 11.7–15.4)
WBC: 5.5 10*3/uL (ref 3.4–10.8)

## 2022-06-08 ENCOUNTER — Ambulatory Visit: Payer: Medicare PPO | Admitting: Gastroenterology

## 2022-06-08 ENCOUNTER — Encounter: Payer: Self-pay | Admitting: Gastroenterology

## 2022-06-08 VITALS — HR 88 | Ht 70.0 in | Wt 232.0 lb

## 2022-06-08 DIAGNOSIS — I5032 Chronic diastolic (congestive) heart failure: Secondary | ICD-10-CM | POA: Diagnosis not present

## 2022-06-08 DIAGNOSIS — Z86010 Personal history of colonic polyps: Secondary | ICD-10-CM

## 2022-06-08 DIAGNOSIS — I2510 Atherosclerotic heart disease of native coronary artery without angina pectoris: Secondary | ICD-10-CM | POA: Diagnosis not present

## 2022-06-08 DIAGNOSIS — Z7901 Long term (current) use of anticoagulants: Secondary | ICD-10-CM

## 2022-06-08 DIAGNOSIS — I480 Paroxysmal atrial fibrillation: Secondary | ICD-10-CM | POA: Diagnosis not present

## 2022-06-08 NOTE — Progress Notes (Signed)
Chief Complaint:    Colon polyp surveillance, medication management  GI History: 67 y.o. female with history of CAD, MI, CHF (EF 55-60%), A. fib (on Xarelto), hypertension, CKD 3, obesity (BMI 43.3), venous stasis, bipolar disorder, follows in the GI clinic for the following:  - 06/2009: Colonoscopy at [facility]: Reportedly normal per patient - 03/03/2019: Positive Cologuard - 03/28/2019: Initial appointment in the GI clinic.  No GI symptoms. - 04/10/2019: Colonoscopy: 4 subcentimeter polyps in the descending/ascending colon (all adenomas), 2 diminutive rectal hyperplastic polyps.  Diverticulosis in the sigmoid, ascending, cecum.  Repeat in 3 years  HPI:     Patient is a 67 y.o. female presenting to the Gastroenterology Clinic to discuss ongoing colon polyp surveillance and medication management in the perioperative setting.  Underwent A-fib ablation 12/06/2021.  Now on amiodarone for episodes of atrial flutter.  Plan for ablation on 06/13/2022.  Was most recently seen in the Electrophysiology Clinic by Dr. Elberta Fortis on 06/05/2022.   She is otherwise without any GI symptoms.  No hematochezia, melena, change in bowel habits.      Latest Ref Rng & Units 06/05/2022    3:54 PM 02/05/2022    7:49 PM 01/30/2022   11:21 PM  CBC  WBC 3.4 - 10.8 x10E3/uL 5.5  6.7  7.4   Hemoglobin 11.1 - 15.9 g/dL 09.6  04.5  40.9   Hematocrit 34.0 - 46.6 % 49.6  43.4  42.0   Platelets 150 - 450 x10E3/uL 135  144  125      Review of systems:     No chest pain, no SOB, no fevers, no urinary sx   Past Medical History:  Diagnosis Date   Anemia    Anxiety    Asthma    Atrial fibrillation (HCC)    Bipolar disorder (HCC)    CAD (coronary artery disease)    Cancer (HCC)    basal cell carcinoma on right hand, papilloma left breast   CHF (congestive heart failure) (HCC)    pt seen at heart/vascular spec clinic 08/14/2014    Chronic kidney disease    Coarse tremors    hands   Colon polyps    Coronary artery  disease    Depression    Depression    Diabetes (HCC)    DVT (deep vein thrombosis) in pregnancy    pt. 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HCC) 02/2007   non-q-wave with second septal perforator   Hyperlipidemia    Hypertension    Hypothyroidism    history of took medication, has resolved   Knee pain, left    Lower extremity deep venous thrombosis (HCC)    20 years ago    Measles    hx of in childhood    Morbid obesity with BMI of 40.0-44.9, adult (HCC)    Pain at surgical incision    Left breast from procedure 09/11/16   Pneumonia    hx of    PONV (postoperative nausea and vomiting)    also slow to wake up   Psychiatric hospitalization 05/2008   Shingles    20 years ago    Shortness of breath dyspnea    exercise    Type 2 diabetes mellitus (HCC) 12/31/2015   Urinary incontinence    UTI (urinary tract infection)  Patient's surgical history, family medical history, social history, medications and allergies were all reviewed in Epic    Current Outpatient Medications  Medication Sig Dispense Refill   ACCU-CHEK GUIDE test strip USE AS INSTRUCTED 300 strip 3   Accu-Chek Softclix Lancets lancets USE AS DIRECTED 300 each 3   allopurinol (ZYLOPRIM) 100 MG tablet TAKE TWO TABLETS BY MOUTH DAILY 180 tablet 1   benztropine (COGENTIN) 1 MG tablet Take 1 mg by mouth 2 (two) times daily.  2   Biotin w/ Vitamins C & E (HAIR/SKIN/NAILS PO) Take 1 tablet by mouth daily.     Blood Glucose Calibration (ACCU-CHEK GUIDE CONTROL) LIQD 1 drop by In Vitro route as directed. E11.9 1 each 1   Blood Glucose Monitoring Suppl (ACCU-CHEK GUIDE ME) w/Device KIT 1 each by Does not apply route in the morning and at bedtime. E11.9 1 kit 0   calcitRIOL (ROCALTROL) 0.25 MCG capsule TAKE 1 CAPSULE BY MOUTH DAILY 90 capsule 1   Cholecalciferol (VITAMIN D3) 50 MCG (2000 UT)  capsule Take 2,000 Units by mouth daily.     divalproex (DEPAKOTE ER) 500 MG 24 hr tablet Take 1,000 mg by mouth at bedtime.     furosemide (LASIX) 80 MG tablet TAKE ONE TABLET BY MOUTH EVERY MORNING AND ONE-HALF TABLET BY MOUTH IN THE AFTERNOON 135 tablet 3   JARDIANCE 10 MG TABS tablet TAKE ONE TABLET BY MOUTH DAILY 90 tablet 3   LATUDA 60 MG TABS Take 60 mg by mouth at bedtime.      levalbuterol (XOPENEX HFA) 45 MCG/ACT inhaler Inhale 2 puffs into the lungs every 4 (four) hours as needed for wheezing. 1 Inhaler 1   levothyroxine (SYNTHROID) 50 MCG tablet TAKE ONE TABLET BY MOUTH DAILY BEFORE BREAKFAST 90 tablet 1   metoprolol tartrate (LOPRESSOR) 25 MG tablet Take 25 mg by mouth 2 (two) times daily.     nitroGLYCERIN (NITROSTAT) 0.4 MG SL tablet Place 1 tablet (0.4 mg total) under the tongue every 5 (five) minutes as needed for chest pain. For chest pain 25 tablet 3   NON FORMULARY Place 1 drop into both eyes 2 (two) times daily. Regener-Eyes eye drops     OLANZapine (ZYPREXA) 15 MG tablet Take 15 mg by mouth at bedtime.     rosuvastatin (CRESTOR) 20 MG tablet TAKE ONE TABLET BY MOUTH DAILY 90 tablet 2   tirzepatide (MOUNJARO) 10 MG/0.5ML Pen Inject 10 mg into the skin once a week. 6 mL 1   XARELTO 20 MG TABS tablet TAKE ONE TABLET BY MOUTH AT BEDTIME 90 tablet 3   No current facility-administered medications for this visit.    Physical Exam:     Pulse 88   Ht 5\' 10"  (1.778 m)   Wt 232 lb (105.2 kg)   BMI 33.29 kg/m   GENERAL:  Pleasant female in NAD PSYCH: : Cooperative, normal affect Musculoskeletal:  Normal muscle tone, normal strength NEURO: Alert and oriented x 3, no focal neurologic deficits   IMPRESSION and PLAN:    1) History of colon polyps Due for repeat colonoscopy for ongoing polyp surveillance.  However, planning on repeat ablation next week which certainly takes priority.  Can eventually plan for colonoscopy following repeat ablation and follow-up in the  Cardiology Clinic with cardiology clearance to proceed with procedure and holding anticoagulation.  May need to wait 3+ months after ablation before scheduling. - Will message her Cardiologist 1-2 weeks after ablation to discuss cardiac clearance to proceed with procedure,  hold anticoagulation, along with appropriate timing for that.  Colonoscopy can certainly be scheduled later in the year  2) Atrial fibrillation 3) Chronic anticoagulation 4) CHF 5) CAD 6) HTN As above, colonoscopy can be completed later on in the year as her active cardiac issues certainly take priority.  When planning colonoscopy, will request formal Cardiology clearance to proceed along with holding Xarelto in the preoperative period           Shellia Cleverly ,DO, FACG 06/08/2022, 10:50 AM

## 2022-06-08 NOTE — Patient Instructions (Addendum)
We will schedule colonoscopy after 3 months after we get cardiac  clearance from your cardiologist.  If you use inhalers (even only as needed), please bring them with you on the day of your procedure.   _______________________________________________________  If your blood pressure at your visit was 140/90 or greater, please contact your primary care physician to follow up on this.  _______________________________________________________  If you are age 67 or older, your body mass index should be between 23-30. Your Body mass index is 33.29 kg/m. If this is out of the aforementioned range listed, please consider follow up with your Primary Care Provider.  __________________________________________________________  The [facility] providers would like to encourage you to use [facility] to communicate with providers for non-urgent requests or questions.  Due to long hold times on the telephone, sending your provider a message by [facility] may be a faster and more efficient way to get a response.  Please allow 48 business hours for a response.  Please remember that this is for non-urgent requests.   Due to recent changes in healthcare laws, you may see the results of your imaging and laboratory studies on MyChart before your provider has had a chance to review them.  We understand that in some cases there may be results that are confusing or concerning to you. Not all laboratory results come back in the same time frame and the provider may be waiting for multiple results in order to interpret others.  Please give [place] 48 hours in order for your provider to thoroughly review all the results before contacting the office for clarification of your results.   Thank you for choosing me and [facility].  Vito Cirigliano, D.O.

## 2022-06-08 NOTE — Patient Instructions (Addendum)
It was great to see you again today, I hope your ablation tomorrow goes smoothly!  I will be in touch with your labs soon as possible  As we discussed, concerns about memory loss are extremely common.  Some decrease in memory in brain processing speed is normal with aging.  However, if you become more concerned or if family and friends have noticed a problem we are glad to send you for formal memory testing  In the meantime, we will rule out any likely lab explanation  Things that are good for your body are also good for your brain-exercise, healthy food, avoidance of tobacco and alcohol Definitely get your hearing corrected because hearing loss can lead to memory loss!  Starting in a brain exercise program with puzzles, mental games, and possibly learning something new/relearn something from your school years can also be helpful

## 2022-06-08 NOTE — Progress Notes (Signed)
Healthcare at [facility] [address] [place], [place] [address] [phone]  Date:  06/12/2022   Name:  Kathleen [name]   DOB:  [birth year]   MRN:  [id number]  PCP:  Pearline Cables, MD    Chief Complaint: Memory Loss (Pt says both of her parents died from Alzheimers and she is starting to notice changes in her memory. /Foot exam is due/)   History of Present Illness:  Kathleen [name] is a 67 y.o. very pleasant female patient who presents with the following:  Patient seen today with concern about her memory Most recent visit with myself was in March of this year -h/o morbid obesity, chronic diastolic HF, DM2, hypothyroidism, hyperparathyroidism status post parathyroidectomy, HTN, HL and CAD with NSTEMI 2009, CRI. Sleep study 11/2013 was negative. Also has history of A fib, bipolar disorder managed by Dr Yetta Barre.  She had an atrial fibrillation ablation in November 2023 but unfortunately has since had episodes of flutter She is on Xarelto due to atrial fibs/flutter She plans to have a repeat ablation for atrial fibrillation this week per Dr. Elberta Fortis  Lab Results  Component Value Date   HGBA1C 5.5 03/30/2022   Her diabetes has been under very good control-she is using Jardiance 10, Mounjaro 10  Today Debbie notes concern about her memory She notes that both her parents died of alzheimer's disease and she is concerned she could also be affected She has been feeling more stressed about this and was worried so she scheduled this appointment She has noticed possible changes in her memory for about a year She has not gotten lost, did not forget to pay bills, etc.  However she does notice that she may forget where she put things, forget conversations, etc and she has more difficulty doing math No family or friends have mentioned anything to you  She is physically feeling ok except for the a fib and is getting her ablation tomorrow She  will have to stay on xarelto long term per her report   Patient Active Problem List   Diagnosis Date Noted   Diabetes mellitus without complication (HCC) 07/12/2021   Hypercoagulable state due to paroxysmal atrial fibrillation (HCC) 06/30/2021   At increased risk of breast cancer 03/01/2021   Osteopenia 12/02/2019   AKI (acute kidney injury) (HCC)    CAP (community acquired pneumonia) 03/01/2019   Atrial fibrillation with RVR (HCC) 02/28/2019   Chronic kidney disease (CKD), stage III (moderate) (HCC) 01/14/2019   Vitamin D deficiency 07/22/2018   Complete rotator cuff tear or rupture of right shoulder, not specified as traumatic 02/27/2017   Auditory hallucination 12/13/2016   Gout 06/29/2016   Type 2 diabetes mellitus (HCC) 12/31/2015   Coronary artery dissection    Lung nodule seen on imaging study 09/24/2014   Paroxysmal atrial fibrillation (HCC) 09/11/2014   Asthmatic bronchitis with exacerbation 05/25/2014   Primary hyperparathyroidism (HCC) 04/15/2014   Chronic diastolic heart failure (HCC) 10/02/2013   Shortness of breath 08/31/2013   Venous stasis dermatitis of both lower extremities 08/19/2013   Nocturia 06/12/2013   Morbid obesity (HCC) 08/09/2012   Urinary incontinence, urge 07/11/2012   CAD (coronary artery disease) 10/31/2010   NEOPLASM OF UNCERTAIN BEHAVIOR OF SKIN 02/11/2010   Bipolar disorder (HCC) 09/14/2009   Hyperlipidemia 05/23/2008   UNSPECIFIED DISORDER OF THYROID 02/04/2008   HYPERTENSION, BENIGN ESSENTIAL 02/04/2008   MYOCARDIAL INFARCTION, HX OF 03/05/2007    Past Medical  History:  Diagnosis Date   Anemia    Anxiety    Asthma    Atrial fibrillation (HCC)    Bipolar disorder (HCC)    CAD (coronary artery disease)    Cancer (HCC)    basal cell carcinoma on right hand, papilloma left breast   CHF (congestive heart failure) (HCC)    pt seen at heart/vascular spec clinic 08/14/2014    Chronic kidney disease    Coarse tremors    hands   Colon  polyps    Coronary artery disease    Depression    Depression    Diabetes (HCC)    DVT (deep vein thrombosis) in pregnancy    pt. 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HCC) 02/2007   non-q-wave with second septal perforator   Hyperlipidemia    Hypertension    Hypothyroidism    history of took medication, has resolved   Knee pain, left    Lower extremity deep venous thrombosis (HCC)    20 years ago    Measles    hx of in childhood    Morbid obesity with BMI of 40.0-44.9, adult (HCC)    Pain at surgical incision    Left breast from procedure 09/11/16   Pneumonia    hx of    PONV (postoperative nausea and vomiting)    also slow to wake up   Psychiatric hospitalization 05/2008   Shingles    20 years ago    Shortness of breath dyspnea    exercise    Type 2 diabetes mellitus (HCC) 12/31/2015   Urinary incontinence    UTI (urinary tract infection)     Past Surgical History:  Procedure Laterality Date   ATRIAL FIBRILLATION ABLATION N/A 12/06/2021   Procedure: ATRIAL FIBRILLATION ABLATION;  Surgeon: Regan Lemming, MD;  Location: [facility] INVASIVE CV LAB;  Service: Cardiovascular;  Laterality: N/A;   BASAL CELL CARCINOMA EXCISION     BREAST EXCISIONAL BIOPSY Left 2018   BREAST LUMPECTOMY WITH RADIOACTIVE SEED LOCALIZATION Left 09/11/2016   Procedure: LEFT BREAST LUMPECTOMY WITH RADIOACTIVE SEED LOCALIZATION;  Surgeon: Glenna Fellows, MD;  Location: [facility] OR;  Service: General;  Laterality: Left;   BREAST MASS EXCISION     age 89, benign tumor   CARDIAC CATHETERIZATION     CARDIAC CATHETERIZATION N/A 12/30/2015   Procedure: Left Heart Cath and Coronary Angiography;  Surgeon: Lyn Records, MD;  Location: [facility] INVASIVE CV LAB;  Service: Cardiovascular;  Laterality: N/A;   COLONOSCOPY  07/15/2009   [facility]   DILATATION & CURETTAGE/HYSTEROSCOPY WITH  MYOSURE N/A 09/22/2016   Procedure: DILATATION & CURETTAGE/HYSTEROSCOPY WITH MYOSURE;  Surgeon: Richarda Overlie, MD;  Location: [facility] ORS;  Service: Gynecology;  Laterality: N/A;   DILATION AND CURETTAGE OF UTERUS     KNEE SURGERY     right, removed cartilage   PARATHYROIDECTOMY N/A 08/25/2014   Procedure: PARATHYROIDECTOMY;  Surgeon: Darnell Level, MD;  Location: [facility] ORS;  Service: General;  Laterality: N/A;   SHOULDER ARTHROSCOPY WITH ROTATOR CUFF REPAIR AND SUBACROMIAL DECOMPRESSION Right 02/27/2017   Procedure: Right shoulder arthroscopic rotator cuff repair with biceps tenodesis and subacromial decompression;  Surgeon: Yolonda Kida, MD;  Location: [facility] OR;  Service: Orthopedics;  Laterality: Right;  150 mins   TONSILLECTOMY     TUBAL LIGATION  Social History   Tobacco Use   Smoking status: Never    Passive exposure: Past   Smokeless tobacco: Never   Tobacco comments:    Never smoker 01/03/22  Vaping Use   Vaping Use: Never used  Substance Use Topics   Alcohol use: Yes    Alcohol/week: 0.0 standard drinks of alcohol    Comment: occ.   Drug use: No    Family History  Problem Relation Age of Onset   Breast cancer Mother 27   Alcohol abuse Mother    Alzheimer's disease Mother    Lung cancer Mother    Alcohol abuse Father    Alzheimer's disease Father    Parkinson's disease Father    Alcohol abuse Maternal Grandfather    Drug abuse Maternal Grandfather    Alcohol abuse Maternal Grandmother    Alcohol abuse Paternal Grandfather    Alcohol abuse Paternal Grandmother    Schizophrenia Cousin    Diabetes Brother    Hypertension Brother    Colon cancer Neg Hx    Esophageal cancer Neg Hx    Rectal cancer Neg Hx    Stomach cancer Neg Hx     Allergies  Allergen Reactions   Tetanus-Diphtheria Toxoids Td Itching    Other reaction(s): Myalgias (intolerance) Vigorous Local Soft Tissue Reaction to TDAP   Influenza Vaccines Hives    Can eat foods with cooked eggs;  CANNOT handle when part of flu vaccine, etc.    Tetanus Toxoids     Vigorous local reaction to tdap with local pain and itching    Lamictal [Lamotrigine] Hives and Rash    Medication list has been reviewed and updated.  Current Outpatient Medications on File Prior to Visit  Medication Sig Dispense Refill   ACCU-CHEK GUIDE test strip USE AS INSTRUCTED 300 strip 3   Accu-Chek Softclix Lancets lancets USE AS DIRECTED 300 each 3   acetaminophen (TYLENOL) 500 MG tablet Take 500 mg by mouth every 6 (six) hours as needed for moderate pain.     allopurinol (ZYLOPRIM) 100 MG tablet TAKE TWO TABLETS BY MOUTH DAILY (Patient taking differently: Take 100 mg by mouth 2 (two) times daily.) 180 tablet 1   benztropine (COGENTIN) 1 MG tablet Take 1 mg by mouth 2 (two) times daily.  2   Biotin w/ Vitamins C & E (HAIR/SKIN/NAILS PO) Take 1 tablet by mouth daily.     Blood Glucose Calibration (ACCU-CHEK GUIDE CONTROL) LIQD 1 drop by In Vitro route as directed. E11.9 1 each 1   Blood Glucose Monitoring Suppl (ACCU-CHEK GUIDE ME) w/Device KIT 1 each by Does not apply route in the morning and at bedtime. E11.9 1 kit 0   calcitRIOL (ROCALTROL) 0.25 MCG capsule TAKE 1 CAPSULE BY MOUTH DAILY 90 capsule 1   Cholecalciferol (VITAMIN D3) 50 MCG (2000 UT) capsule Take 2,000 Units by mouth daily.     divalproex (DEPAKOTE ER) 500 MG 24 hr tablet Take 1,000 mg by mouth at bedtime.     furosemide (LASIX) 80 MG tablet TAKE ONE TABLET BY MOUTH EVERY MORNING AND ONE-HALF TABLET BY MOUTH IN THE AFTERNOON (Patient taking differently: TAKE ONE TABLET BY MOUTH EVERY MORNING MAY TAKE ONE-HALF TABLET BY MOUTH IN THE AFTERNOON AS NEEDED FOR SWELLING) 135 tablet 3   JARDIANCE 10 MG TABS tablet TAKE ONE TABLET BY MOUTH DAILY 90 tablet 3   LATUDA 60 MG TABS Take 60 mg by mouth at bedtime.      levothyroxine (SYNTHROID) 50 MCG  tablet TAKE ONE TABLET BY MOUTH DAILY BEFORE BREAKFAST 90 tablet 1   metoprolol tartrate (LOPRESSOR) 25 MG tablet  Take 25 mg by mouth 2 (two) times daily.     nitroGLYCERIN (NITROSTAT) 0.4 MG SL tablet Place 1 tablet (0.4 mg total) under the tongue every 5 (five) minutes as needed for chest pain. For chest pain 25 tablet 3   NON FORMULARY Place 1 drop into both eyes 2 (two) times daily. Regener-Eyes eye drops     OLANZapine (ZYPREXA) 15 MG tablet Take 15 mg by mouth at bedtime.     rosuvastatin (CRESTOR) 20 MG tablet TAKE ONE TABLET BY MOUTH DAILY (Patient taking differently: Take 20 mg by mouth at bedtime.) 90 tablet 2   tirzepatide (MOUNJARO) 10 MG/0.5ML Pen Inject 10 mg into the skin once a week. 6 mL 1   XARELTO 20 MG TABS tablet TAKE ONE TABLET BY MOUTH AT BEDTIME 90 tablet 3   No current facility-administered medications on file prior to visit.    Review of Systems:  As per HPI- otherwise negative.   Physical Examination: Vitals:   06/12/22 1053  BP: 122/70  Pulse: 86  Resp: 18  Temp: 97.6 F (36.4 C)  SpO2: 98%   Vitals:   06/12/22 1053  Weight: 233 lb 9.6 oz (106 kg)  Height: 5\' 11"  (1.803 m)   Body mass index is 32.58 kg/m. Ideal Body Weight: Weight in (lb) to have BMI = 25: 178.9  GEN: no acute distress.  Tall build, mildly obese.  Looks her normal self HEENT: Atraumatic, Normocephalic.  Ears and Nose: No external deformity. CV: RRR, No M/G/R. No JVD. No thrill. No extra heart sounds. PULM: CTA B, no wheezes, crackles, rhonchi. No retractions. No resp. distress. No accessory muscle use. EXTR: No c/c/e PSYCH: Normally interactive. Conversant.    Assessment and Plan: Memory change - Plan: RPR, Vitamin B12, Folate  Patient seen today with concerns about her memory.  She does note a strong family history of Alzheimer's disease.  Will obtain RPR, vitamin B12 and folate levels.  I encouraged her to share concerns with family and friends so they can report any memory problems they may notice.  I offered to send her for neuropsychological testing but for the time being she  declines.  She wishes to watch things, and as above will confide her concerns so others can also watch out for problems.  I encouraged her to avoid alcohol and tobacco, get regular exercise and eat a healthy diet.  Also recommend a "brain exercise" program which might include puzzles, games, learning something new such as a language or studying mathematics  Signed Abbe Amsterdam, MD  Addendum 5/21, received labs as below.  Message to patient  Results for orders placed or performed in visit on 06/12/22  Vitamin B12  Result Value Ref Range   Vitamin B-12 867 211 - 911 pg/mL  Folate  Result Value Ref Range   Folate >23.9 >5.9 ng/mL

## 2022-06-12 ENCOUNTER — Ambulatory Visit (INDEPENDENT_AMBULATORY_CARE_PROVIDER_SITE_OTHER): Payer: Medicare PPO | Admitting: Family Medicine

## 2022-06-12 VITALS — BP 122/70 | HR 86 | Temp 97.6°F | Resp 18 | Ht 71.0 in | Wt 233.6 lb

## 2022-06-12 DIAGNOSIS — R413 Other amnesia: Secondary | ICD-10-CM | POA: Diagnosis not present

## 2022-06-12 NOTE — Pre-Procedure Instructions (Signed)
Instructed patient on the following items: Arrival time 0800 Nothing to eat or drink after midnight No meds AM of procedure Responsible person to drive you home and stay with you for 24 hrs  Have you missed any doses of anti-coagulant Xarelto- hasn't missed any doses, don't take in the morning.  Kathleen [name]- last dose was Saturday 06/03/22 Jardiance-last dose was Friday 06/09/22

## 2022-06-13 ENCOUNTER — Ambulatory Visit (HOSPITAL_COMMUNITY): Payer: Medicare PPO | Admitting: Anesthesiology

## 2022-06-13 ENCOUNTER — Ambulatory Visit (HOSPITAL_BASED_OUTPATIENT_CLINIC_OR_DEPARTMENT_OTHER): Payer: Medicare PPO

## 2022-06-13 ENCOUNTER — Encounter (HOSPITAL_COMMUNITY)
Admission: RE | Disposition: A | Discharge status: Home / Self Care | Payer: Self-pay | Source: Home / Self Care | Attending: Cardiology

## 2022-06-13 ENCOUNTER — Ambulatory Visit (HOSPITAL_BASED_OUTPATIENT_CLINIC_OR_DEPARTMENT_OTHER): Payer: Medicare PPO | Admitting: Anesthesiology

## 2022-06-13 ENCOUNTER — Encounter: Payer: Self-pay | Admitting: Family Medicine

## 2022-06-13 ENCOUNTER — Ambulatory Visit (HOSPITAL_COMMUNITY)
Admission: RE | Admit: 2022-06-13 | Discharge: 2022-06-13 | Disposition: A | Discharge status: Home / Self Care | Payer: Medicare PPO | Attending: Cardiology | Admitting: Cardiology

## 2022-06-13 ENCOUNTER — Encounter (HOSPITAL_COMMUNITY): Payer: Self-pay | Admitting: Cardiology

## 2022-06-13 DIAGNOSIS — I5032 Chronic diastolic (congestive) heart failure: Secondary | ICD-10-CM | POA: Insufficient documentation

## 2022-06-13 DIAGNOSIS — D6869 Other thrombophilia: Secondary | ICD-10-CM | POA: Diagnosis not present

## 2022-06-13 DIAGNOSIS — Z6833 Body mass index (BMI) 33.0-33.9, adult: Secondary | ICD-10-CM | POA: Diagnosis not present

## 2022-06-13 DIAGNOSIS — Z7722 Contact with and (suspected) exposure to environmental tobacco smoke (acute) (chronic): Secondary | ICD-10-CM | POA: Diagnosis not present

## 2022-06-13 DIAGNOSIS — I4891 Unspecified atrial fibrillation: Secondary | ICD-10-CM

## 2022-06-13 DIAGNOSIS — I4892 Unspecified atrial flutter: Secondary | ICD-10-CM | POA: Diagnosis not present

## 2022-06-13 DIAGNOSIS — Z79899 Other long term (current) drug therapy: Secondary | ICD-10-CM | POA: Insufficient documentation

## 2022-06-13 DIAGNOSIS — I509 Heart failure, unspecified: Secondary | ICD-10-CM

## 2022-06-13 DIAGNOSIS — I130 Hypertensive heart and chronic kidney disease with heart failure and stage 1 through stage 4 chronic kidney disease, or unspecified chronic kidney disease: Secondary | ICD-10-CM

## 2022-06-13 DIAGNOSIS — E1122 Type 2 diabetes mellitus with diabetic chronic kidney disease: Secondary | ICD-10-CM | POA: Insufficient documentation

## 2022-06-13 DIAGNOSIS — I083 Combined rheumatic disorders of mitral, aortic and tricuspid valves: Secondary | ICD-10-CM | POA: Diagnosis not present

## 2022-06-13 DIAGNOSIS — Z8249 Family history of ischemic heart disease and other diseases of the circulatory system: Secondary | ICD-10-CM | POA: Insufficient documentation

## 2022-06-13 DIAGNOSIS — I4819 Other persistent atrial fibrillation: Secondary | ICD-10-CM

## 2022-06-13 DIAGNOSIS — G4733 Obstructive sleep apnea (adult) (pediatric): Secondary | ICD-10-CM | POA: Diagnosis not present

## 2022-06-13 DIAGNOSIS — I252 Old myocardial infarction: Secondary | ICD-10-CM | POA: Diagnosis not present

## 2022-06-13 DIAGNOSIS — Z7901 Long term (current) use of anticoagulants: Secondary | ICD-10-CM | POA: Diagnosis not present

## 2022-06-13 DIAGNOSIS — N189 Chronic kidney disease, unspecified: Secondary | ICD-10-CM | POA: Insufficient documentation

## 2022-06-13 HISTORY — PX: TEE WITHOUT CARDIOVERSION: SHX5443

## 2022-06-13 HISTORY — PX: ATRIAL FIBRILLATION ABLATION: EP1191

## 2022-06-13 LAB — GLUCOSE, CAPILLARY
Glucose-Capillary: 118 mg/dL — ABNORMAL HIGH (ref 70–99)
Glucose-Capillary: 125 mg/dL — ABNORMAL HIGH (ref 70–99)
Glucose-Capillary: 116 mg/dL — ABNORMAL HIGH (ref 70–99)

## 2022-06-13 LAB — VITAMIN B12: Vitamin B-12: 867 pg/mL (ref 211–911)

## 2022-06-13 LAB — RPR: RPR Ser Ql: NONREACTIVE

## 2022-06-13 LAB — FOLATE: Folate: >23.9 ng/mL (ref >5.9)

## 2022-06-13 LAB — ECHO TEE: Weight: 3744 oz

## 2022-06-13 SURGERY — ECHOCARDIOGRAM, TRANSESOPHAGEAL
Anesthesia: Monitor Anesthesia Care

## 2022-06-13 SURGERY — ATRIAL FIBRILLATION ABLATION
Anesthesia: General

## 2022-06-13 MED ORDER — HEPARIN SODIUM (PORCINE) 1000 UNIT/ML IJ SOLN
INTRAMUSCULAR | Status: DC | PRN
  Administered 2022-06-13: 14000 [IU] via INTRAVENOUS

## 2022-06-13 MED ORDER — ACETAMINOPHEN 325 MG PO TABS: 650.0000 mg | ORAL_TABLET | ORAL | Status: DC | PRN

## 2022-06-13 MED ORDER — HEPARIN SODIUM (PORCINE) 1000 UNIT/ML IJ SOLN
INTRAMUSCULAR | Status: DC | PRN
  Administered 2022-06-13: 1000 [IU] via INTRAVENOUS

## 2022-06-13 MED ORDER — SODIUM CHLORIDE 0.9 % IV SOLN: INTRAVENOUS | Status: DC

## 2022-06-13 MED ORDER — HEPARIN SODIUM (PORCINE) 1000 UNIT/ML IJ SOLN
INTRAMUSCULAR | Status: AC
  Filled 2022-06-13: qty 10

## 2022-06-13 MED ORDER — DOBUTAMINE INFUSION FOR EP/ECHO/NUC (1000 MCG/ML)
INTRAVENOUS | Status: DC | PRN
  Administered 2022-06-13: 20 ug/kg/min via INTRAVENOUS

## 2022-06-13 MED ORDER — SUGAMMADEX SODIUM 200 MG/2ML IV SOLN
INTRAVENOUS | Status: DC | PRN
  Administered 2022-06-13: 200 mg via INTRAVENOUS

## 2022-06-13 MED ORDER — PROTAMINE SULFATE 10 MG/ML IV SOLN
INTRAVENOUS | Status: DC | PRN
  Administered 2022-06-13: 40 mg via INTRAVENOUS

## 2022-06-13 MED ORDER — DOBUTAMINE INFUSION FOR EP/ECHO/NUC (1000 MCG/ML)
INTRAVENOUS | Status: AC
  Filled 2022-06-13: qty 250

## 2022-06-13 MED ORDER — PROPOFOL 10 MG/ML IV BOLUS
INTRAVENOUS | Status: DC | PRN
  Administered 2022-06-13: 150 mg via INTRAVENOUS

## 2022-06-13 MED ORDER — ONDANSETRON HCL 4 MG/2ML IJ SOLN
INTRAMUSCULAR | Status: DC | PRN
  Administered 2022-06-13: 4 mg via INTRAVENOUS

## 2022-06-13 MED ORDER — FENTANYL CITRATE (PF) 250 MCG/5ML IJ SOLN
INTRAMUSCULAR | Status: DC | PRN
  Administered 2022-06-13: 50 ug via INTRAVENOUS

## 2022-06-13 MED ORDER — ONDANSETRON HCL 4 MG/2ML IJ SOLN: 4.0000 mg | Freq: Four times a day (QID) | INTRAMUSCULAR | Status: DC | PRN

## 2022-06-13 MED ORDER — EPHEDRINE SULFATE-NACL 50-0.9 MG/10ML-% IV SOSY
PREFILLED_SYRINGE | INTRAVENOUS | Status: DC | PRN
  Administered 2022-06-13: 5 mg via INTRAVENOUS

## 2022-06-13 MED ORDER — PHENYLEPHRINE 80 MCG/ML (10ML) SYRINGE FOR IV PUSH (FOR BLOOD PRESSURE SUPPORT): PREFILLED_SYRINGE | INTRAVENOUS | Status: DC | PRN

## 2022-06-13 MED ORDER — ROCURONIUM BROMIDE 10 MG/ML (PF) SYRINGE
PREFILLED_SYRINGE | INTRAVENOUS | Status: DC | PRN
  Administered 2022-06-13 (×2): 30 mg via INTRAVENOUS

## 2022-06-13 MED ORDER — LIDOCAINE 2% (20 MG/ML) 5 ML SYRINGE
INTRAMUSCULAR | Status: DC | PRN
  Administered 2022-06-13: 60 mg via INTRAVENOUS

## 2022-06-13 MED ORDER — DEXAMETHASONE SODIUM PHOSPHATE 10 MG/ML IJ SOLN
INTRAMUSCULAR | Status: DC | PRN
  Administered 2022-06-13: 10 mg via INTRAVENOUS

## 2022-06-13 MED ORDER — HEPARIN (PORCINE) IN NACL 1000-0.9 UT/500ML-% IV SOLN
INTRAVENOUS | Status: DC | PRN
  Administered 2022-06-13 (×4): 500 mL

## 2022-06-13 MED ORDER — PHENYLEPHRINE HCL-NACL 20-0.9 MG/250ML-% IV SOLN
INTRAVENOUS | Status: DC | PRN
  Administered 2022-06-13: 20 ug/min via INTRAVENOUS

## 2022-06-13 SURGICAL SUPPLY — 21 items
BAG SNAP BAND KOVER 36X36 (MISCELLANEOUS) IMPLANT
CATH 8FR REPROCESSED SOUNDSTAR (CATHETERS) ×1 IMPLANT
CATH 8FR SOUNDSTAR REPROCESSED (CATHETERS) IMPLANT
CATH ABLAT QDOT MICRO BI TC DF (CATHETERS) IMPLANT
CATH OCTARAY 2.0 F 3-3-3-3-3 (CATHETERS) IMPLANT
CATH PIGTAIL STEERABLE D1 8.7 (WIRE) IMPLANT
CATH S-M CIRCA TEMP PROBE (CATHETERS) IMPLANT
CATH WEB BI DIR CSDF CRV REPRO (CATHETERS) IMPLANT
CLOSURE MYNX CONTROL 6F/7F (Vascular Products) IMPLANT
CLOSURE PERCLOSE PROSTYLE (VASCULAR PRODUCTS) IMPLANT
COVER SWIFTLINK CONNECTOR (BAG) ×1 IMPLANT
PACK EP LATEX FREE (CUSTOM PROCEDURE TRAY) ×1
PACK EP LF (CUSTOM PROCEDURE TRAY) ×1 IMPLANT
PAD DEFIB RADIO PHYSIO CONN (PAD) ×1 IMPLANT
PATCH CARTO3 (PAD) IMPLANT
SHEATH CARTO VIZIGO SM CVD (SHEATH) IMPLANT
SHEATH PINNACLE 7F 10CM (SHEATH) IMPLANT
SHEATH PINNACLE 8F 10CM (SHEATH) IMPLANT
SHEATH PINNACLE 9F 10CM (SHEATH) IMPLANT
SHEATH PROBE COVER 6X72 (BAG) IMPLANT
TUBING SMART ABLATE COOLFLOW (TUBING) IMPLANT

## 2022-06-13 NOTE — Discharge Instructions (Addendum)
Cardiac Ablation, Care After  This sheet gives you information about how to care for yourself after your procedure. Your health care provider may also give you more specific instructions. If you have problems or questions, contact your health care provider. What can I expect after the procedure? After the procedure, it is common to have: Bruising around your puncture site. Tenderness around your puncture site. Skipped heartbeats. If you had an atrial fibrillation ablation, you may have atrial fibrillation during the first several months after your procedure.  Tiredness (fatigue).  Follow these instructions at home: Puncture site care  Follow instructions from your health care provider about how to take care of your puncture site. Make sure you: If a large square bandage is present, this may be removed 3 pm after surgery.  Check your puncture site every day for signs of infection. Check for: Redness, swelling, or pain. Fluid or blood. If your puncture site starts to bleed, lie down on your back, apply firm pressure to the area, and contact your health care provider. Warmth. Pus or a bad smell. A pea or small marble sized lump at the site is normal and can take up to three months to resolve.  Driving Do not drive for at least 4 days after your procedure or however long your health care provider recommends. (Do not resume driving if you have previously been instructed not to drive for other health reasons.) Do not drive or use heavy machinery while taking prescription pain medicine. Activity Avoid activities that take a lot of effort for at least 7 days after your procedure. Do not lift anything that is heavier than 10 lb (4.5 kg) for one week.  No sexual activity for 1 week.  Return to your normal activities as told by your health care provider. Ask your health care provider what activities are safe for you. General instructions Take over-the-counter and prescription medicines only as told  by your health care provider. Do not use any products that contain nicotine or tobacco, such as cigarettes and e-cigarettes. If you need help quitting, ask your health care provider. You may shower 3:00 pm but Do not take baths, swim, or use a hot tub for 1 week.  Do not drink alcohol for 24 hours after your procedure. Keep all follow-up visits as told by your health care provider. This is important. Contact a health care provider if: You have redness, mild swelling, or pain around your puncture site. You have fluid or blood coming from your puncture site that stops after applying firm pressure to the area. Your puncture site feels warm to the touch. You have pus or a bad smell coming from your puncture site. You have a fever. You have chest pain or discomfort that spreads to your neck, jaw, or arm. You have chest pain that is worse with lying on your back or taking a deep breath. You are sweating a lot. You feel nauseous. You have a fast or irregular heartbeat. You have shortness of breath. You are dizzy or light-headed and feel the need to lie down. You have pain or numbness in the arm or leg closest to your puncture site. Get help right away if: Your puncture site suddenly swells. Your puncture site is bleeding and the bleeding does not stop after applying firm pressure to the area. These symptoms may represent a serious problem that is an emergency. Do not wait to see if the symptoms will go away. Get medical help right away. Call your local  emergency services (911 in the [place] Do not drive yourself to the hospital. Summary After the procedure, it is normal to have bruising and tenderness at the puncture site in your groin, neck, or forearm. Check your puncture site every day for signs of infection. Get help right away if your puncture site is bleeding and the bleeding does not stop after applying firm pressure to the area. This is a medical emergency. This information is not intended  to replace advice given to you by your health care provider. Make sure you discuss any questions you have with your health care provider.    Femoral Site Care This sheet gives you information about how to care for yourself after your procedure. Your health care provider may also give you more specific instructions. If you have problems or questions, contact your health care provider. What can I expect after the procedure?  After the procedure, it is common to have: Bruising that usually fades within 1-2 weeks. Tenderness at the site. Follow these instructions at home: Wound care Follow instructions from your health care provider about how to take care of your insertion site. Make sure you: Wash your hands with soap and water before you change your bandage (dressing). If soap and water are not available, use hand sanitizer. Remove your dressing at 3:00pm.Do not place a new dressing on the site. Do not take baths, swim, or use a hot tub until your health care provider approves. You may shower after moving the dressing. Pat the area dry with a clean towel. Do not rub the site. This may cause bleeding. Do not apply powder or lotion to the site. Keep the site clean and dry. Check your femoral site every day for signs of infection. Check for: Redness, swelling, or pain. Fluid or blood. Warmth. Pus or a bad smell. Activity For the first 2-3 days after your procedure, or as long as directed: Avoid climbing stairs as much as possible. Do not squat. Do not lift anything that is heavier than 10 lb (4.5 kg), or the limit that you are told, until your health care provider says that it is safe. For 5 days Rest as directed. Avoid sitting for a long time without moving. Get up to take short walks every 1-2 hours. Do not drive for 24 hours if you were given a medicine to help you relax (sedative). General instructions Take over-the-counter and prescription medicines only as told by your health care  provider. Keep all follow-up visits as told by your health care provider. This is important. Contact a health care provider if you have: A fever or chills. You have redness, swelling, or pain around your insertion site. Get help right away if: The catheter insertion area swells very fast. You pass out. You suddenly start to sweat or your skin gets clammy. The catheter insertion area is bleeding, and the bleeding does not stop when you hold steady pressure on the area. The area near or just beyond the catheter insertion site becomes pale, cool, tingly, or numb. These symptoms may represent a serious problem that is an emergency. Do not wait to see if the symptoms will go away. Get medical help right away. Call your local emergency services (911 in the [place] Do not drive yourself to the hospital. Summary After the procedure, it is common to have bruising that usually fades within 1-2 weeks. Check your femoral site every day for signs of infection. Do not lift anything that is heavier than 10 lb (4.5  kg), or the limit that you are told, until your health care provider says that it is safe. This information is not intended to replace advice given to you by your health care provider. Make sure you discuss any questions you have with your health care provider. Document Revised: 01/22/2017 Document Reviewed: 01/22/2017 Elsevier Patient Education  2020 [employer].

## 2022-06-13 NOTE — Anesthesia Procedure Notes (Signed)
Procedure Name: Intubation Date/Time: 06/13/2022 10:05 AM  Performed by: Quentin Ore, CRNAPre-anesthesia Checklist: Patient identified, Emergency Drugs available, Suction available and Patient being monitored Patient Re-evaluated:Patient Re-evaluated prior to induction Oxygen Delivery Method: Circle system utilized Preoxygenation: Pre-oxygenation with 100% oxygen Induction Type: IV induction Ventilation: Mask ventilation without difficulty Laryngoscope Size: Glidescope and 3 Tube type: Oral Tube size: 7.0 mm Number of attempts: 1 Airway Equipment and Method: Stylet and Video-laryngoscopy Placement Confirmation: ETT inserted through vocal cords under direct vision, positive ETCO2 and breath sounds checked- equal and bilateral Secured at: 21 cm Tube secured with: Tape Dental Injury: Teeth and Oropharynx as per pre-operative assessment  Comments: Electively used for GlidescopeGo due to previous intubation. Grade I view on Glidescope screen. +ETCO2 & BBS=.

## 2022-06-13 NOTE — Anesthesia Preprocedure Evaluation (Signed)
Anesthesia Evaluation  Patient identified by MRN, date of birth, ID band Patient awake    Reviewed: Allergy & Precautions, NPO status , Patient's Chart, lab work & pertinent test results  History of Anesthesia Complications (+) PONV and history of anesthetic complications  Airway Mallampati: IV  TM Distance: <3 FB Neck ROM: Full    Dental  (+) Teeth Intact, Dental Advisory Given   Pulmonary shortness of breath, asthma , pneumonia   breath sounds clear to auscultation       Cardiovascular hypertension, Pt. on medications (-) angina + Past MI and +CHF  + dysrhythmias  Rhythm:Regular   1. Left ventricular ejection fraction, by estimation, is 55 to 60%. The  left ventricle has normal function. The left ventricle has no regional  wall motion abnormalities. Left ventricular diastolic parameters are  consistent with Grade I diastolic  dysfunction (impaired relaxation). The average left ventricular global  longitudinal strain is -21.6 %. The global longitudinal strain is normal.   2. Right ventricular systolic function is mildly reduced. The right  ventricular size is normal. There is normal pulmonary artery systolic  pressure.   3. The mitral valve is normal in structure. No evidence of mitral valve  regurgitation. No evidence of mitral stenosis.   4. Tricuspid valve regurgitation is mild to moderate.   5. The aortic valve is normal in structure. Aortic valve regurgitation is  mild. No aortic stenosis is present.   6. The inferior vena cava is normal in size with greater than 50%  respiratory variability, suggesting right atrial pressure of 3 mmHg.     Neuro/Psych  Headaches PSYCHIATRIC DISORDERS Anxiety Depression Bipolar Disorder      GI/Hepatic negative GI ROS, Neg liver ROS,,,  Endo/Other  diabetesHypothyroidism    Renal/GU CRFRenal disease     Musculoskeletal negative musculoskeletal ROS (+)    Abdominal   Peds   Hematology  (+) Blood dyscrasia Lab Results      Component                Value               Date                      WBC                      5.5                 06/05/2022                HGB                      16.6 (H)            06/05/2022                HCT                      49.6 (H)            06/05/2022                MCV                      92                  06/05/2022  PLT                      135 (L)             06/05/2022             xarelto   Anesthesia Other Findings   Reproductive/Obstetrics                              Anesthesia Physical Anesthesia Plan  ASA: 3  Anesthesia Plan: General   Post-op Pain Management: Minimal or no pain anticipated   Induction: Intravenous  PONV Risk Score and Plan: 4 or greater and Propofol infusion, TIVA and Ondansetron  Airway Management Planned: Oral ETT and Video Laryngoscope Planned  Additional Equipment: None  Intra-op Plan:   Post-operative Plan: Extubation in OR  Informed Consent: I have reviewed the patients History and Physical, chart, labs and discussed the procedure including the risks, benefits and alternatives for the proposed anesthesia with the patient or authorized representative who has indicated his/her understanding and acceptance.     Dental advisory given  Plan Discussed with: CRNA  Anesthesia Plan Comments:          Anesthesia Quick Evaluation

## 2022-06-13 NOTE — Interval H&P Note (Signed)
History and Physical Interval Note:  06/13/2022 7:26 AM  Kathleen [name]  has presented today for surgery, with the diagnosis of afib.  The various methods of treatment have been discussed with the patient and family. After consideration of risks, benefits and other options for treatment, the patient has consented to  Procedure(s): ATRIAL FIBRILLATION ABLATION (N/A) TRANSESOPHAGEAL ECHOCARDIOGRAM (N/A) as a surgical intervention.  The patient's history has been reviewed, patient examined, no change in status, stable for surgery.  I have reviewed the patient's chart and labs.  Questions were answered to the patient's satisfaction.     Faryn Sieg [employer]

## 2022-06-13 NOTE — Anesthesia Postprocedure Evaluation (Signed)
Anesthesia Post Note  Patient: Kathleen [name]  Procedure(s) Performed: ATRIAL FIBRILLATION ABLATION TRANSESOPHAGEAL ECHOCARDIOGRAM     Patient location during evaluation: Cath Lab Anesthesia Type: General Level of consciousness: awake and alert Pain management: pain level controlled Vital Signs Assessment: post-procedure vital signs reviewed and stable Respiratory status: spontaneous breathing, nonlabored ventilation and respiratory function stable Cardiovascular status: blood pressure returned to baseline and stable Postop Assessment: no apparent nausea or vomiting Anesthetic complications: no   There were no known notable events for this encounter.  Last Vitals:  Vitals:   06/13/22 1400 06/13/22 1500  BP: 99/66 105/65  Pulse: 86 87  Resp: 12 12  Temp:    SpO2: 94% 94%    Last Pain:  Vitals:   06/13/22 1238  TempSrc:   PainSc: 0-No pain                 Damyon Mullane

## 2022-06-13 NOTE — Transfer of Care (Signed)
Immediate Anesthesia Transfer of Care Note  Patient: Kathleen [name]  Procedure(s) Performed: ATRIAL FIBRILLATION ABLATION TRANSESOPHAGEAL ECHOCARDIOGRAM  Patient Location: PACU  Anesthesia Type:General  Level of Consciousness: awake, alert , and oriented  Airway & Oxygen Therapy: Patient Spontanous Breathing and Patient connected to nasal cannula oxygen  Post-op Assessment: Report given to RN, Post -op Vital signs reviewed and stable, and Patient moving all extremities  Post vital signs: Reviewed and stable  Last Vitals:  Vitals Value Taken Time  BP 101/63 06/13/22 1206  Temp 36.4 C 06/13/22 1205  Pulse 88 06/13/22 1210  Resp 13 06/13/22 1210  SpO2 96 % 06/13/22 1210  Vitals shown include unvalidated device data.  Last Pain:  Vitals:   06/13/22 1205  TempSrc: Temporal  PainSc: 0-No pain         Complications: There were no known notable events for this encounter.

## 2022-06-13 NOTE — CV Procedure (Signed)
    TRANSESOPHAGEAL ECHOCARDIOGRAM   NAME:  Kathleen [name]   MRN: [id number] DOB:  03/30/1955   ADMIT DATE: 06/13/2022  INDICATIONS:   PROCEDURE:   Informed consent was obtained prior to the procedure. The risks, benefits and alternatives for the procedure were discussed and the patient comprehended these risks.  Risks include, but are not limited to, cough, sore throat, vomiting, nausea, somnolence, esophageal and stomach trauma or perforation, bleeding, low blood pressure, aspiration, pneumonia, infection, trauma to the teeth and death.    The patient was already sedated and intubated by anesthesia service. After a procedural time-out, the transesophageal probe was inserted in the esophagus and stomach without difficulty and multiple views were obtained.    COMPLICATIONS:    There were no immediate complications.  FINDINGS:  LEFT VENTRICLE: EF = 55-60%. No regional wall motion abnormalities.  RIGHT VENTRICLE: Normal size and function.   LEFT ATRIUM: Mild to moderately dilated LA diameter 4.4cm  LEFT ATRIAL APPENDAGE: No thrombus.   RIGHT ATRIUM: Normal  AORTIC VALVE:  Trileaflet. Trivial AI  MITRAL VALVE:    Normal. Trivial MR  TRICUSPID VALVE: Normal. Trivial TR  PULMONIC VALVE: Grossly normal. Trivial PI  INTERATRIAL SEPTUM: No PFO or ASD.  PERICARDIUM: No effusion  DESCENDING AORTA: Trace plaque   Briley Sulton,MD 10:15 AM

## 2022-06-13 NOTE — Interval H&P Note (Signed)
History and Physical Interval Note:  06/13/2022 10:08 AM  Kathleen [name]  has presented today for surgery, with the diagnosis of afib.  The various methods of treatment have been discussed with the patient and family. After consideration of risks, benefits and other options for treatment, the patient has consented to  Procedure(s): ATRIAL FIBRILLATION ABLATION (N/A) TRANSESOPHAGEAL ECHOCARDIOGRAM (N/A) as a surgical intervention.  The patient's history has been reviewed, patient examined, no change in status, stable for surgery.  I have reviewed the patient's chart and labs.  Questions were answered to the patient's satisfaction.     Taris Galindo

## 2022-06-14 ENCOUNTER — Encounter (HOSPITAL_COMMUNITY): Payer: Self-pay | Admitting: Cardiology

## 2022-06-14 ENCOUNTER — Encounter: Payer: Self-pay | Admitting: Cardiology

## 2022-06-14 LAB — POCT ACTIVATED CLOTTING TIME: Activated Clotting Time: 412 seconds

## 2022-06-16 DIAGNOSIS — G4733 Obstructive sleep apnea (adult) (pediatric): Secondary | ICD-10-CM | POA: Diagnosis not present

## 2022-06-19 LAB — ECHO TEE: Height: 71 in

## 2022-06-22 ENCOUNTER — Ambulatory Visit: Payer: Medicare PPO | Attending: Audiologist | Admitting: Audiologist

## 2022-06-22 ENCOUNTER — Other Ambulatory Visit (HOSPITAL_COMMUNITY): Payer: Self-pay

## 2022-06-22 DIAGNOSIS — H903 Sensorineural hearing loss, bilateral: Secondary | ICD-10-CM | POA: Diagnosis not present

## 2022-06-22 NOTE — Procedures (Addendum)
  Outpatient Audiology and [facility] [address] [place], [place]  [address] [phone]  AUDIOLOGICAL  EVALUATION  NAME: Kathleen [name]     DOB:   11-Jul-1955      MRN: [id number]                                                                                     DATE: 06/22/2022     REFERENT: Pearline Cables, MD STATUS: Outpatient DIAGNOSIS: Sensorineural Hearing loss, bilateral    History: [name] was seen for an audiological evaluation.  [name] is receiving a hearing evaluation due to concerns for cognitive impairment and increased difficulty hearing. [name] has difficulty hearing in both ears. This difficulty began gradually. [name] reports difficulty hearing the TV, conversations on the phone, and in noisy crowds. No pain or pressure reported in either ear. Denies tinnitus in either ear. [name] does not have a history of noise exposure.  Medical history positive for diabetes which is a risk factor for hearing loss. No other relevant case history reported.   Evaluation:  Otoscopy showed a clear view of the tympanic membranes, bilaterally Tympanometry results were consistent with normal middle ear function and tympanic membrane movement, bilaterally. Audiometric testing was completed using conventional audiometry with insert and supraural headphones transducer. Speech Recognition Thresholds were 30 dB in the right ear and 25 dB in the left ear. Word Recognition was performed 70 dB SL, scoring 92% in the right ear and at 65dB SL, scoring 84% in the left ear. Pure tone thresholds show mild sloping to moderate sensorineural  hearing loss in the right  ear and a mild sloping to moderate sensorineural hearing loss in the left ear.   Results:  The test results were reviewed with [name] and recommendation for hearing aids was made. Hearing loss sloping from mild to moderate bilaterally. Hearing aids warranted due to cognitive decline concern and diabetes. Information on where to  purchase hearing aids was provided. No pressure, tinnitus, and normal tympanograms bilaterally. A slight conductive component was noted for the right ear, it was recommended that [name] see an ENT if new symptoms arise. No Otolaryngology referral requested at this time.   Recommendations: No further audiologic testing is needed unless future hearing concerns arise.  Amplification is necessary for both ears. Hearing aids can be purchased from a variety of locations. See provided list for locations in the [place] area.   30 minutes spent testing and counseling on results.   Neko Boyajian Tera Partridge MS Ammie Ferrier  Audiologist, Au.D., CCC-A 06/22/2022  11:34 AM  Cc: Pearline Cables, MD

## 2022-06-24 ENCOUNTER — Encounter: Payer: Self-pay | Admitting: Family Medicine

## 2022-07-11 ENCOUNTER — Ambulatory Visit (HOSPITAL_COMMUNITY)
Admission: RE | Admit: 2022-07-11 | Discharge: 2022-07-11 | Disposition: A | Discharge status: Home / Self Care | Payer: Medicare PPO | Source: Ambulatory Visit | Attending: Physician Assistant | Admitting: Physician Assistant

## 2022-07-11 ENCOUNTER — Encounter (HOSPITAL_COMMUNITY): Payer: Self-pay | Admitting: Physician Assistant

## 2022-07-11 VITALS — BP 122/72 | HR 89 | Ht 71.0 in | Wt 226.2 lb

## 2022-07-11 DIAGNOSIS — I480 Paroxysmal atrial fibrillation: Secondary | ICD-10-CM | POA: Diagnosis not present

## 2022-07-11 DIAGNOSIS — I4892 Unspecified atrial flutter: Secondary | ICD-10-CM | POA: Diagnosis not present

## 2022-07-11 DIAGNOSIS — I5032 Chronic diastolic (congestive) heart failure: Secondary | ICD-10-CM | POA: Insufficient documentation

## 2022-07-11 DIAGNOSIS — G4733 Obstructive sleep apnea (adult) (pediatric): Secondary | ICD-10-CM | POA: Diagnosis not present

## 2022-07-11 DIAGNOSIS — I2510 Atherosclerotic heart disease of native coronary artery without angina pectoris: Secondary | ICD-10-CM | POA: Insufficient documentation

## 2022-07-11 DIAGNOSIS — I110 Hypertensive heart disease with heart failure: Secondary | ICD-10-CM | POA: Insufficient documentation

## 2022-07-11 DIAGNOSIS — D6869 Other thrombophilia: Secondary | ICD-10-CM | POA: Diagnosis not present

## 2022-07-11 NOTE — Progress Notes (Signed)
Primary Care Physician: Pearline Cables, MD Primary Cardiologist: Arvilla Meres, MD Electrophysiologist: Will Jorja Loa, MD  Referring Physician: Dr Cleotis Lema Ayman Fonseca is a 67 y.o. female with a history of chronic diastolic CHF, DM, HTN, HLD, CAD, OSA, atrial flutter, atrial fibrillation who presents for follow up in the [facility]. The patient was admitted 08/2014 for Afib RVR. She converted to sinus tach with dilt drip and was transitioned to Toprol-XL 25 mg daily. She was admitted 02/2019 for atrial fibrillation w/ RVR in the setting of CAP and also w/ a/c diastolic HF. She converted again without DCCV. Cardiac monitor placed 05/2021 showed 15% afib burden. Patient is on Xarelto for a CHADS2VASC score of 6. She is s/p afib ablation with Dr Elberta Fortis on 12/06/21. She developed atrial flutter and was started on amiodarone as a bridge to ablation. This was later discontinued due to fatigue.   On follow up today, patient is s/p repeat ablation 06/13/22. She reports that she has done well post ablation with no know episodes of afib. No bleeding issues on anticoagulation. She denies chest pain or groin issues. She did have some throat pain after TEE but this is resolving.   Today, she denies symptoms of palpitations, chest pain, shortness of breath, orthopnea, PND, lower extremity edema, dizziness, presyncope, syncope, bleeding, or neurologic sequela. The patient is tolerating medications without difficulties and is otherwise without complaint today.   ROS- All systems are reviewed and negative except as per the HPI above.   Atrial Fibrillation Risk Factors:  she does have symptoms or diagnosis of sleep apnea. she is compliant with CPAP therapy. she does not have a history of rheumatic fever.   she has a BMI of Body mass index is 31.55 kg/m.[name] Filed Weights   07/11/22 1317  Weight: 102.6 kg     Atrial Fibrillation Management  history:  Previous antiarrhythmic drugs: amiodarone  Previous cardioversions: none Previous ablations: 12/06/21, 06/13/22 Anticoagulation history: Xarelto    Physical Exam: Ht 5\' 11"  (1.803 m)   Wt 102.6 kg   BMI 31.55 kg/m   GEN: Well nourished, well developed in no acute distress NECK: No JVD; No carotid bruits CARDIAC: Regular rate and rhythm, no murmurs, rubs, gallops RESPIRATORY:  Clear to auscultation without rales, wheezing or rhonchi  ABDOMEN: Soft, non-tender, non-distended EXTREMITIES:  No edema; No deformity   Wt Readings from Last 3 Encounters:  07/11/22 102.6 kg  06/13/22 106.1 kg  06/12/22 106 kg    EKG today demonstrates  SR Vent. rate 89 BPM PR interval 156 ms QRS duration 76 ms QT/QTcB 342/416 ms  Echo 02/15/21 demonstrated   1. Left ventricular ejection fraction, by estimation, is 55 to 60%. The  left ventricle has normal function. The left ventricle has no regional  wall motion abnormalities. Left ventricular diastolic parameters are  consistent with Grade I diastolic dysfunction (impaired relaxation). The average left ventricular global longitudinal strain is -21.6 %. The global longitudinal strain is normal.   2. Right ventricular systolic function is mildly reduced. The right  ventricular size is normal. There is normal pulmonary artery systolic  pressure.   3. The mitral valve is normal in structure. No evidence of mitral valve  regurgitation. No evidence of mitral stenosis.   4. Tricuspid valve regurgitation is mild to moderate.   5. The aortic valve is normal in structure. Aortic valve regurgitation is  mild. No aortic stenosis is present.   6. The  inferior vena cava is normal in size with greater than 50%  respiratory variability, suggesting right atrial pressure of 3 mmHg.    CHA2DS2-VASc Score = 6  The patient's score is based upon: CHF History: 1 HTN History: 1 Diabetes History: 1 Stroke History: 0 Vascular Disease History: 1 Age  Score: 1 Gender Score: 1       ASSESSMENT AND PLAN: 1. Paroxysmal Atrial Fibrillation/atrial flutter The patient's CHA2DS2-VASc score is 6, indicating a 9.7% annual risk of stroke.   S/p ablation 12/06/21 and 06/13/22 Patient appears to be maintaining SR.  Continue Xarelto 20 mg daily with no missed doses for 3 months post ablation.  Continue Lopressor 25 mg BID  Secondary Hypercoagulable State (ICD10:  D68.69) The patient is at significant risk for stroke/thromboembolism based upon her CHA2DS2-VASc Score of 6.  Continue Rivaroxaban (Xarelto).   OSA  Encouraged nightly CPAP The importance of adequate treatment of sleep apnea was discussed today in order to improve our ability to maintain sinus rhythm long term Followed by Dr Mayford Knife  HTN Stable on current regimen  CAD  No anginal symptoms  Chronic HFpEF Followed in the [facility] Fluid status appears stable today.    Follow up with Dr Elberta Fortis as scheduled.    Jorja Loa PA-C [facility] [address] [place], [place] [address] [phone]

## 2022-07-11 NOTE — Progress Notes (Signed)
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 Sleep study 11/2013 was negative.     NSTEMI in 2/09 -> cath with occlusion of septal perforator. Otherwise normal arteries.  Admitted 8/16 for Afib RVR. She converted to sinus tach with dilt drip and was transitioned to Toprol-XL 25 mg daily.   LHC 12/17 with widely patent coronary arteries. LVEF 55%. Mildly elevated LVEDP.   Admitted 2/21 for atrial fibrillation w/ RVR in the setting of CAP and also w/ a/c diastolic HF. Echo EF 60-65%. Hospital course also notable for AKI. SCr rose to 2.09 during admit but improved down to 1.46 on d/c. D/c wt 330 lb.  Zio patch 3/22 - 3% AF burden.  Echo 1/23 showed EF 55-60%, grade I DD, mildly reduced RV, mild to moderate TR, mild AI.  Zio 5/23 - AF burden 15%. 3 runs SVT  Underwent AF ablation 12/06/21  Follow up 12/23, doing well NYHA II. Entresto stopped with low BP. Seen in ED 01/30/22 with AFL RVR, underwent DCCV to NSR. Fllow up with EP (1/24), started on amiodarone 200 daily but later stopped due to fatigue.  S/p TEE & AF ablation (5/24) EF 55-60%, normal RV, no LAA thombus or PFO  Today she returns for HF follow up. Overall feeling fine. Feels much better now that she is back in rhythm. Does elliptical bike 45 minutes, machines for 15 minutes and then walks at the [facility] 6 days a week. She has some ankle swelling and takes extra 40 mg lasix occasionally. Denies palpitations, abnormal bleeding, CP, dizziness, or PND/Orthopnea. Appetite ok. No fever or chills. Weight at  home 226 pounds. Taking all medications.    Past Medical History:  Diagnosis Date   Anemia    Anxiety    Asthma    Atrial fibrillation (HCC)    Bipolar disorder (HCC)    CAD (coronary artery disease)    Cancer (HCC)    basal cell carcinoma on right hand, papilloma left breast   CHF (congestive heart failure) (HCC)    pt seen at heart/vascular spec clinic 08/14/2014    Chronic kidney disease    Coarse tremors    hands   Colon polyps    Coronary artery disease    Depression    Depression    Diabetes (HCC)    DVT (deep vein thrombosis) in pregnancy    pt. 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HCC) 02/2007   non-q-wave with second septal perforator   Hyperlipidemia    Hypertension    Hypothyroidism    history of took medication, has resolved   Knee pain, left    Lower extremity deep venous thrombosis (HCC)    20 years ago    Measles    hx of in childhood    Morbid obesity with BMI of 40.0-44.9, adult (HCC)  Pain at surgical incision    Left breast from procedure 09/11/16   Pneumonia    hx of    PONV (postoperative nausea and vomiting)    also slow to wake up   Psychiatric hospitalization 05/2008   Shingles    20 years ago    Shortness of breath dyspnea    exercise    Type 2 diabetes mellitus (HCC) 12/31/2015   Urinary incontinence    UTI (urinary tract infection)    Past Surgical History:  Procedure Laterality Date   ATRIAL FIBRILLATION ABLATION N/A 12/06/2021   Procedure: ATRIAL FIBRILLATION ABLATION;  Surgeon: Regan Lemming, MD;  Location: [facility] INVASIVE CV LAB;  Service: Cardiovascular;  Laterality: N/A;   ATRIAL FIBRILLATION ABLATION N/A 06/13/2022   Procedure: ATRIAL FIBRILLATION ABLATION;  Surgeon: Regan Lemming, MD;  Location: [facility] INVASIVE CV LAB;  Service: Cardiovascular;  Laterality: N/A;   BASAL CELL CARCINOMA  EXCISION     BREAST EXCISIONAL BIOPSY Left 2018   BREAST LUMPECTOMY WITH RADIOACTIVE SEED LOCALIZATION Left 09/11/2016   Procedure: LEFT BREAST LUMPECTOMY WITH RADIOACTIVE SEED LOCALIZATION;  Surgeon: Glenna Fellows, MD;  Location: [facility] OR;  Service: General;  Laterality: Left;   BREAST MASS EXCISION     age 36, benign tumor   CARDIAC CATHETERIZATION     CARDIAC CATHETERIZATION N/A 12/30/2015   Procedure: Left Heart Cath and Coronary Angiography;  Surgeon: Lyn Records, MD;  Location: [facility] INVASIVE CV LAB;  Service: Cardiovascular;  Laterality: N/A;   COLONOSCOPY  07/15/2009   [facility]   DILATATION & CURETTAGE/HYSTEROSCOPY WITH MYOSURE N/A 09/22/2016   Procedure: DILATATION & CURETTAGE/HYSTEROSCOPY WITH MYOSURE;  Surgeon: Richarda Overlie, MD;  Location: [facility] ORS;  Service: Gynecology;  Laterality: N/A;   DILATION AND CURETTAGE OF UTERUS     KNEE SURGERY     right, removed cartilage   PARATHYROIDECTOMY N/A 08/25/2014   Procedure: PARATHYROIDECTOMY;  Surgeon: Darnell Level, MD;  Location: [facility] ORS;  Service: General;  Laterality: N/A;   SHOULDER ARTHROSCOPY WITH ROTATOR CUFF REPAIR AND SUBACROMIAL DECOMPRESSION Right 02/27/2017   Procedure: Right shoulder arthroscopic rotator cuff repair with biceps tenodesis and subacromial decompression;  Surgeon: Yolonda Kida, MD;  Location[facility];  Service: Orthopedics;  Laterality: Right;  150 mins   TEE WITHOUT CARDIOVERSION N/A 06/13/2022   Procedure: TRANSESOPHAGEAL ECHOCARDIOGRAM;  Surgeon: Dolores Patty, MD;  Location: [facility] INVASIVE CV LAB;  Service: Cardiovascular;  Laterality: N/A;   TONSILLECTOMY     TUBAL LIGATION     Current Outpatient Medications  Medication Sig Dispense Refill   ACCU-CHEK GUIDE test strip USE AS INSTRUCTED 300 strip 3   Accu-Chek Softclix Lancets lancets USE AS DIRECTED 300 each 3   acetaminophen (TYLENOL) 500 MG tablet Take 500 mg by mouth every 6 (six) hours as needed for moderate pain.     allopurinol (ZYLOPRIM)  100 MG tablet Take 100 mg by mouth 2 (two) times daily.     benztropine (COGENTIN) 1 MG tablet Take 1 mg by mouth 2 (two) times daily.  2   Biotin w/ Vitamins C & E (HAIR/SKIN/NAILS PO) Take 1 tablet by mouth daily.     Blood Glucose Calibration (ACCU-CHEK GUIDE CONTROL) LIQD 1 drop by In Vitro route as directed. E11.9 1 each 1   Blood Glucose Monitoring Suppl (ACCU-CHEK GUIDE ME) w/Device KIT 1 each by Does not apply route in the morning and at bedtime. E11.9 1 kit 0   calcitRIOL (ROCALTROL) 0.25 MCG capsule TAKE 1 CAPSULE BY  MOUTH DAILY 90 capsule 1   Cholecalciferol (VITAMIN D3) 50 MCG (2000 UT) capsule Take 2,000 Units by mouth daily.     divalproex (DEPAKOTE ER) 500 MG 24 hr tablet Take 1,000 mg by mouth at bedtime.     furosemide (LASIX) 80 MG tablet Take 80 mg by mouth 2 (two) times daily. Take one tablet in the morning and one and half tablet in the afternoon.     JARDIANCE 10 MG TABS tablet TAKE ONE TABLET BY MOUTH DAILY 90 tablet 3   LATUDA 60 MG TABS Take 60 mg by mouth at bedtime.      levothyroxine (SYNTHROID) 50 MCG tablet TAKE ONE TABLET BY MOUTH DAILY BEFORE BREAKFAST 90 tablet 1   metoprolol tartrate (LOPRESSOR) 25 MG tablet Take 25 mg by mouth 2 (two) times daily.     nitroGLYCERIN (NITROSTAT) 0.4 MG SL tablet Place 1 tablet (0.4 mg total) under the tongue every 5 (five) minutes as needed for chest pain. For chest pain 25 tablet 3   NON FORMULARY Place 1 drop into both eyes 2 (two) times daily. Regener-Eyes eye drops     OLANZapine (ZYPREXA) 15 MG tablet Take 15 mg by mouth at bedtime.     rosuvastatin (CRESTOR) 20 MG tablet Take 20 mg by mouth at bedtime.     tirzepatide (MOUNJARO) 10 MG/0.5ML Pen Inject 10 mg into the skin once a week. 6 mL 1   XARELTO 20 MG TABS tablet TAKE ONE TABLET BY MOUTH AT BEDTIME 90 tablet 3   No current facility-administered medications for this encounter.    Allergies:   Tetanus-diphtheria toxoids td, Influenza vaccines, Tetanus toxoids, and  Lamictal [lamotrigine]   Social History:  The patient  reports that she has never smoked. She has been exposed to tobacco smoke. She has never used smokeless tobacco. She reports current alcohol use. She reports that she does not use drugs.   Family History:  The patient's family history includes Alcohol abuse in her father, maternal grandfather, maternal grandmother, mother, paternal grandfather, and paternal grandmother; Alzheimer's disease in her father and mother; Breast cancer (age of onset: 99) in her mother; Diabetes in her brother; Drug abuse in her maternal grandfather; Hypertension in her brother; Lung cancer in her mother; Parkinson's disease in her father; Schizophrenia in her cousin.   ROS:  Please see the history of present illness.   All other systems are personally reviewed and negative.   Recent Labs: 01/30/2022: B Natriuretic Peptide 54.6; Magnesium 2.2 03/30/2022: ALT 38; TSH 4.67 06/05/2022: BUN 30; Creatinine, Ser 1.95; Hemoglobin 16.6; Platelets 135; Potassium 4.2; Sodium 141  Personally reviewed   BP 110/68   Pulse 80   Wt 102.8 kg (226 lb 9.6 oz)   SpO2 98%   BMI 31.60 kg/m   Wt Readings from Last 3 Encounters:  07/12/22 102.8 kg (226 lb 9.6 oz)  07/11/22 102.6 kg (226 lb 3.2 oz)  06/13/22 106.1 kg (234 lb)   Physical Exam General:  NAD. No resp difficulty, walked into clinic HEENT: Normal Neck: Supple. No JVD. Carotids 2+ bilat; no bruits. No lymphadenopathy or thryomegaly appreciated. Cor: PMI nondisplaced. Regular rate & rhythm. No rubs, gallops or murmurs. Lungs: Clear Abdomen: Soft, nontender, nondistended. No hepatosplenomegaly. No bruits or masses. Good bowel sounds. Extremities: No cyanosis, clubbing, rash, edema Neuro: Alert & oriented x 3, cranial nerves grossly intact. Moves all 4 extremities w/o difficulty. Affect pleasant.  ECG (personally reviewed from 07/11/22): NSR 89 bpm  ASSESSMENT AND PLAN:  1. Chronic diastolic HF:  - Echo (7/17): EF 55-60%  Grade IDD  - Echo (7/19): EF 55-60%  - Echo (2/21): EF 60-65%, RV ok  - Echo (1/23): EF 60-65%, RV ok. - TEE (5/24): EF 55-60%, RV ok - Stable NYHA I-II Volume looks good today. Doing well with sliding scale lasix - Off Entresto with low BP - Continue Lasix 80 mg daily, can take extra 40 mg PRN  - Continue Jardiance 10 mg daily. No GU symptoms  - Continue metoprolol tartrate 25 mg bid. - Check labs  2. PAF - Zio patch 3/22 - 3% AF burden - Repeat Zio 5/23 - AF burden 15% (average rate 128 in AF. 81 when in sinus). 3 runs SVT - Underwent AF ablation 12/06/21 -> in NSR - s/p AF ablation (5/24) - Regular on exam today, ECG yesterday showed NSR - CHADSVASC = 5 - Continue metoprolol - Continue Xarelto 20 mg daily. No bleeding - CBC today  3. CAD - No s/s angina - No ASA w/ Xarelto.  - Continue statin.     4. HTN   - BP stable - GDMT as above  5. DM2 - HgbA1c 5.5 (3/24) - Continue Jardiance - Continue GLP1  6. CKD 3b - Follows with Dr. Allena Katz. - Continue SGLT2i. - Labs today  7. Obesity: - Body mass index is 31.6 kg/m. - Continue with exercises at [facility]. - Has lost 65 lbs with Mounjaro, overall down 100 lbs!   8. Snoring:  - No OSA on previous sleep study in 2015  Doing well! Follow up in 6 months with Dr. Gala Romney.   Anderson Malta Colonia, FNP  07/12/2022 1:36 PM  [facility] [address] Heart and Vascular [name] [address] [phone] (office) [phone] (fax)

## 2022-07-12 ENCOUNTER — Encounter (HOSPITAL_COMMUNITY): Payer: Self-pay

## 2022-07-12 ENCOUNTER — Ambulatory Visit (HOSPITAL_COMMUNITY)
Admission: RE | Admit: 2022-07-12 | Discharge: 2022-07-12 | Disposition: A | Discharge status: Home / Self Care | Payer: Medicare PPO | Source: Ambulatory Visit | Attending: Family Medicine | Admitting: Family Medicine

## 2022-07-12 VITALS — BP 110/68 | HR 80 | Wt 226.6 lb

## 2022-07-12 DIAGNOSIS — I5032 Chronic diastolic (congestive) heart failure: Secondary | ICD-10-CM | POA: Insufficient documentation

## 2022-07-12 DIAGNOSIS — I130 Hypertensive heart and chronic kidney disease with heart failure and stage 1 through stage 4 chronic kidney disease, or unspecified chronic kidney disease: Secondary | ICD-10-CM | POA: Diagnosis not present

## 2022-07-12 DIAGNOSIS — E1122 Type 2 diabetes mellitus with diabetic chronic kidney disease: Secondary | ICD-10-CM | POA: Insufficient documentation

## 2022-07-12 DIAGNOSIS — I480 Paroxysmal atrial fibrillation: Secondary | ICD-10-CM

## 2022-07-12 DIAGNOSIS — N1832 Chronic kidney disease, stage 3b: Secondary | ICD-10-CM | POA: Diagnosis not present

## 2022-07-12 DIAGNOSIS — Z794 Long term (current) use of insulin: Secondary | ICD-10-CM | POA: Diagnosis not present

## 2022-07-12 DIAGNOSIS — E669 Obesity, unspecified: Secondary | ICD-10-CM

## 2022-07-12 DIAGNOSIS — I10 Essential (primary) hypertension: Secondary | ICD-10-CM

## 2022-07-12 DIAGNOSIS — I2510 Atherosclerotic heart disease of native coronary artery without angina pectoris: Secondary | ICD-10-CM

## 2022-07-12 DIAGNOSIS — N183 Chronic kidney disease, stage 3 unspecified: Secondary | ICD-10-CM | POA: Diagnosis not present

## 2022-07-12 DIAGNOSIS — R0683 Snoring: Secondary | ICD-10-CM

## 2022-07-12 LAB — BASIC METABOLIC PANEL
Anion gap: 11 (ref 5–15)
BUN: 29 mg/dL — ABNORMAL HIGH (ref 8–23)
CO2: 31 mmol/L (ref 22–32)
Calcium: 9.7 mg/dL (ref 8.9–10.3)
Chloride: 93 mmol/L — ABNORMAL LOW (ref 98–111)
Creatinine, Ser: 2.06 mg/dL — ABNORMAL HIGH (ref 0.44–1.00)
GFR, Estimated: 26 mL/min — ABNORMAL LOW (ref >60)
Glucose, Bld: 95 mg/dL (ref 70–99)
Potassium: 4 mmol/L (ref 3.5–5.1)
Sodium: 135 mmol/L (ref 135–145)

## 2022-07-12 LAB — CBC
HCT: 46.6 % — ABNORMAL HIGH (ref 36.0–46.0)
Hemoglobin: 15.6 g/dL — ABNORMAL HIGH (ref 12.0–15.0)
MCH: 29.9 pg (ref 26.0–34.0)
MCHC: 33.5 g/dL (ref 30.0–36.0)
MCV: 89.4 fL (ref 80.0–100.0)
Platelets: 133 10*3/uL — ABNORMAL LOW (ref 150–400)
RBC: 5.21 MIL/uL — ABNORMAL HIGH (ref 3.87–5.11)
RDW: 14 % (ref 11.5–15.5)
WBC: 6.2 10*3/uL (ref 4.0–10.5)
nRBC: 0 % (ref 0.0–0.2)

## 2022-07-12 NOTE — Patient Instructions (Addendum)
Medication Changes:  We recommend that you continue on your current medications as directed. Please refer to the Current Medication list given to you today.   *If you need a refill on your cardiac medications before your next appointment, please call your pharmacy*  Lab Work:  Labs done today, your results will be available in MyChart, we will contact you for abnormal readings.   Follow-Up in:   Your physician recommends that you schedule a follow-up appointment in: 6 months with Dr. Gala Romney  ** please call in September or October to schedule the appointment. **    Do the following things EVERYDAY: Weigh yourself in the morning before breakfast. Write it down and keep it in a log. Take your medicines as prescribed Eat low salt foods--Limit salt (sodium) to 2000 mg per day.  Stay as active as you can everyday Limit all fluids for the day to less than 2 liters    Need to Contact us:  If you have any questions or concerns before your next appointment please send [place] a message through [place] or call our office at [phone].    TO LEAVE A MESSAGE FOR THE NURSE SELECT OPTION 2, PLEASE LEAVE A MESSAGE INCLUDING: YOUR NAME DATE OF BIRTH CALL BACK NUMBER REASON FOR CALL**this is important as we prioritize the call backs  YOU WILL RECEIVE A CALL BACK THE SAME DAY AS LONG AS YOU CALL BEFORE 4:00 PM   At the [facility], you and your health needs are our priority. As part of our continuing mission to provide you with exceptional heart care, we have created designated Provider Care Teams. These Care Teams include your primary Cardiologist (physician) and Advanced Practice Providers (APPs- Physician Assistants and Nurse Practitioners) who all work together to provide you with the care you need, when you need it.   You may see any of the following providers on your designated Care Team at your next follow up: Dr Arvilla Meres Dr Marca Ancona Dr. Marcos Eke, NP Robbie Lis, [place] [facility] [place], [place] Brynda Peon, NP Karle Plumber, PharmD   Please be sure to bring in all your medications bottles to every appointment.    Thank you for choosing [facility]

## 2022-07-17 DIAGNOSIS — G4733 Obstructive sleep apnea (adult) (pediatric): Secondary | ICD-10-CM | POA: Diagnosis not present

## 2022-08-02 ENCOUNTER — Encounter: Payer: Self-pay | Admitting: Family Medicine

## 2022-08-03 MED ORDER — TIRZEPATIDE 12.5 MG/0.5ML ~~LOC~~ SOAJ: 12.5000 mg | SUBCUTANEOUS | 1 refills | Status: DC

## 2022-08-07 ENCOUNTER — Other Ambulatory Visit: Payer: Self-pay | Admitting: Family Medicine

## 2022-08-07 DIAGNOSIS — E039 Hypothyroidism, unspecified: Secondary | ICD-10-CM

## 2022-08-10 ENCOUNTER — Other Ambulatory Visit: Payer: Self-pay | Admitting: Family Medicine

## 2022-08-10 ENCOUNTER — Telehealth: Payer: Self-pay

## 2022-08-10 DIAGNOSIS — E119 Type 2 diabetes mellitus without complications: Secondary | ICD-10-CM

## 2022-08-10 NOTE — Telephone Encounter (Addendum)
   Name: Kathleen [name]  DOB: 1955-05-27  MRN: [id number]  Primary Cardiologist: Arvilla Meres, MD  Chart reviewed as part of pre-operative protocol coverage. The patient has an upcoming visit scheduled with Dr. Elberta Fortis on 09/12/2022 at which time clearance can be addressed in case there are any issues that would impact surgical recommendations.  Colonoscopy is not scheduled until TBD as below. I added preop FYI to appointment note so that provider is aware to address at time of outpatient visit.  Per office protocol the cardiology provider should forward their finalized clearance decision and recommendations regarding antiplatelet therapy to the requesting party below.    Patient is post ablation in 05/2022.  Patient may not hold his anticoagulation for at least 3 months post ablation.  Will defer recommendations for holding Xarelto to Dr. Elberta Fortis, to be addressed at the time of her appointment.  I will route this message as FYI to requesting party and remove this message from the preop box as separate preop APP input not needed at this time.   Please call with any questions.  Joylene Grapes, NP  08/10/2022, 12:01 PM

## 2022-08-10 NOTE — Telephone Encounter (Signed)
  Request for Surgical Clearance     Procedure:   COLONOSCOPY   Date of Surgery: TBD                                 Surgeon:  DR. Barron Alvine Surgeon's Group or Practice Name:  [facility] Phone number:  [phone] Fax number:  [phone] ATTN; Tyresse Jayson   Type of Clearance Requested:   - Medical and holding Xarelto for 2 days    Type of Anesthesia:   PROPOFOL  Patient is due for a colonoscopy procedure. Please advise when patient will be medically cleared from cardiac standpoint since the patient just had ablation procedure.   Please fax back/ or route the completed form to Johnross Nabozny ( CMA ) at [phone].      Sincerely,       [facility]

## 2022-08-15 ENCOUNTER — Encounter: Payer: Self-pay | Admitting: Cardiology

## 2022-08-16 ENCOUNTER — Encounter (HOSPITAL_COMMUNITY): Payer: Self-pay | Admitting: Internal Medicine

## 2022-08-16 DIAGNOSIS — G4733 Obstructive sleep apnea (adult) (pediatric): Secondary | ICD-10-CM | POA: Diagnosis not present

## 2022-08-24 ENCOUNTER — Telehealth: Payer: Self-pay | Admitting: Gastroenterology

## 2022-08-24 DIAGNOSIS — F411 Generalized anxiety disorder: Secondary | ICD-10-CM | POA: Diagnosis not present

## 2022-08-24 DIAGNOSIS — F3112 Bipolar disorder, current episode manic without psychotic features, moderate: Secondary | ICD-10-CM | POA: Diagnosis not present

## 2022-08-24 NOTE — Telephone Encounter (Signed)
Inbound call from patient stating she was advised during last office visit that she would need a colonoscopy. Patient requesting a call back to discuss scheduling. Please advise, thank you.

## 2022-08-24 NOTE — Telephone Encounter (Signed)
Called patient back. Informed her that we will schedule her colonoscopy after we get clearance from Dr. Annia Friendly. Patient have upcoming appointment with cardiologist on 09/12/22 at which time clearance will be addressed. ( See TE notes from 08/10/22). Patient voiced understanding.

## 2022-09-07 ENCOUNTER — Encounter (INDEPENDENT_AMBULATORY_CARE_PROVIDER_SITE_OTHER): Payer: Self-pay

## 2022-09-12 ENCOUNTER — Encounter: Payer: Self-pay | Admitting: Cardiology

## 2022-09-12 ENCOUNTER — Ambulatory Visit: Payer: Medicare PPO | Admitting: Cardiology

## 2022-09-12 VITALS — BP 122/70 | HR 87 | Ht 71.0 in | Wt 215.0 lb

## 2022-09-12 DIAGNOSIS — I5032 Chronic diastolic (congestive) heart failure: Secondary | ICD-10-CM | POA: Diagnosis not present

## 2022-09-12 DIAGNOSIS — D6869 Other thrombophilia: Secondary | ICD-10-CM | POA: Diagnosis not present

## 2022-09-12 DIAGNOSIS — I483 Typical atrial flutter: Secondary | ICD-10-CM | POA: Diagnosis not present

## 2022-09-12 DIAGNOSIS — I480 Paroxysmal atrial fibrillation: Secondary | ICD-10-CM

## 2022-09-12 DIAGNOSIS — G4733 Obstructive sleep apnea (adult) (pediatric): Secondary | ICD-10-CM | POA: Diagnosis not present

## 2022-09-12 NOTE — Patient Instructions (Signed)
 Medication Instructions:  Your physician recommends that you continue on your current medications as directed. Please refer to the Current Medication list given to you today.  *If you need a refill on your cardiac medications before your next appointment, please call your pharmacy*   Lab Work: None ordered   Testing/Procedures: None ordered   Follow-Up: At [facility], you and your health needs are our priority.  As part of our continuing mission to provide you with exceptional heart care, we have created designated Provider Care Teams.  These Care Teams include your primary Cardiologist (physician) and Advanced Practice Providers (APPs -  Physician Assistants and Nurse Practitioners) who all work together to provide you with the care you need, when you need it.  Your next appointment:   6 month(s)  The format for your next appointment:   In Person  Provider:   You will see one of the following Advanced Practice Providers on your designated Care Team:   Francis Dowse, [place] "Mardelle Matte" [place], [place] Canary Brim, NP   Thank you for choosing [facility]!!   Dory Horn, RN [phone]

## 2022-09-12 NOTE — Progress Notes (Addendum)
  Electrophysiology Office Note:   Date:  09/12/2022  ID:  Kathleen [name], Kathleen [name] Jan 12, 1956, MRN [id number]  Primary Cardiologist: Arvilla Meres, MD Electrophysiologist: Regan Lemming, MD      History of Present Illness:   Kathleen [name] is a 67 y.o. female with h/o diastolic heart failure, hypertension, hyperlipidemia, coronary artery disease, sleep apnea, atrial fibrillation seen today for routine electrophysiology followup.  She is post atrial fibrillation ablation x 2, most recently 06/13/2022. Since last being seen in our clinic the patient reports doing well.  She has noted no further episodes of atrial fibrillation.  She is able to do all of her daily activities.  She continues to exercise, using the exercise bike and elliptical for 45 minutes daily.  she denies chest pain, palpitations, dyspnea, PND, orthopnea, nausea, vomiting, dizziness, syncope, edema, weight gain, or early satiety.   Review of systems complete and found to be negative unless listed in HPI.   EP Information / Studies Reviewed:    EKG is ordered today. Personal review as below.  EKG Interpretation Date/Time:  Tuesday September 12 2022 08:36:30 EDT Ventricular Rate:  87 PR Interval:  156 QRS Duration:  78 QT Interval:  368 QTC Calculation: 442 R Axis:   79  Text Interpretation: Sinus rhythm with occasional Premature ventricular complexes When compared with ECG of 11-Jul-2022 13:22, Premature ventricular complexes are now Present Confirmed by Kathleen [name] ([id number]) on 09/12/2022 8:39:29 AM     Risk Assessment/Calculations:    CHA2DS2-VASc Score = 6   This indicates a 9.7% annual risk of stroke. The patient's score is based upon: CHF History: 1 HTN History: 1 Diabetes History: 1 Stroke History: 0 Vascular Disease History: 1 Age Score: 1 Gender Score: 1             Physical Exam:   VS:  BP 122/70 (BP Location: Left Arm, Patient Position: Sitting, Cuff Size: Large)   Pulse 87   Ht 5\' 11"   (1.803 m)   Wt 215 lb (97.5 kg)   SpO2 97%   BMI 29.99 kg/m    Wt Readings from Last 3 Encounters:  09/12/22 215 lb (97.5 kg)  07/12/22 226 lb 9.6 oz (102.8 kg)  07/11/22 226 lb 3.2 oz (102.6 kg)     GEN: Well nourished, well developed in no acute distress NECK: No JVD; No carotid bruits CARDIAC: Regular rate and rhythm, no murmurs, rubs, gallops RESPIRATORY:  Clear to auscultation without rales, wheezing or rhonchi  ABDOMEN: Soft, non-tender, non-distended EXTREMITIES:  No edema; No deformity   ASSESSMENT AND PLAN:    1.  Paroxysmal atrial fibrillation/flutter: Status post ablation 12/06/2021 with repeat ablation 06/13/2022.  Currently on Xarelto.  She remains in sinus rhythm.  She has had no further episodes of atrial fibrillation or atrial flutter since her ablation.  2.  Secondary hypercoagulable: Currently on Xarelto for atrial fibrillation  3.  Obstructive sleep apnea: CPAP compliance encouraged  4.  Obesity: Lifestyle modification encouraged  5.  Chronic diastolic heart failure: Appears euvolemic.  Plan per heart failure cardiology  5.  Atypical atrial flutter: Post ablation  6.  Preoperative evaluation: Has plans for colonoscopy upcoming.  She Kathleen [name] be able to hold her Eliquis for 2 days prior to colonoscopy.  She is at intermediate risk for an intermediate risk procedure.  No further cardiac testing is necessary at this time.   Follow up with EP APP in 6 months  Signed, Kathleen Petrenko Jorja Loa, MD

## 2022-09-16 DIAGNOSIS — G4733 Obstructive sleep apnea (adult) (pediatric): Secondary | ICD-10-CM | POA: Diagnosis not present

## 2022-09-19 ENCOUNTER — Telehealth: Payer: Self-pay

## 2022-09-19 NOTE — Telephone Encounter (Signed)
Patient scheduled for procedure.

## 2022-09-19 NOTE — Telephone Encounter (Signed)
Called patient and LVM regarding to schedule colonoscopy with Dr. Barron Alvine at [facility] with Previsit appointment.  When patient calls back please schedule colonoscopy and pre visit appointment.

## 2022-09-19 NOTE — Telephone Encounter (Signed)
-----   Message from NULTY,JOHN sent at 09/18/2022  2:56 PM EDT ----- Regarding: RE: Cardiac clearance Dr. Salena Saner  This pt is cleared for anesthetic care at [facility].  Thanks,  JMN ----- Message ----- From: Coletta Memos, CMA Sent: 09/18/2022   2:53 PM EDT To: Cathlyn Parsons, CRNA; Coletta Memos, CMA Subject: FW: Cardiac clearance                          Hi John,  Please see  Dr. Frankey Shown message below. Advise if patient is ok to have colonoscopy at [facility]. Thank you! ----- Message ----- From: Shellia Cleverly, DO Sent: 09/13/2022   5:13 PM EDT To: Cathlyn Parsons, CRNA; Coletta Memos, CMA Subject: RE: Cardiac clearance                          Should be ok for [facility], but will attach John to get his review and input as well.   Phineas Inches for [facility]? Was seen in the Cardiology Clinic yesterday with clearance to proceed and clearance to hold anticoagulation.   VC ----- Message ----- From: Coletta Memos, CMA Sent: 09/13/2022   3:35 PM EDT To: Shellia Cleverly, DO Subject: FW: Cardiac clearance                          Dr. Barron Alvine, Patient just had appointment with Dr. Elberta Fortis and cleared for Colonoscopy from cardiac standpoint. You saw her in May. Not sure if procedure can be done at [facility] or at the hospital. Please advise. ----- Message ----- From: Coletta Memos, CMA Sent: 09/13/2022  12:00 AM EDT To: Coletta Memos, CMA Subject: Cardiac clearance                              Cardiac clearance requested.The patient has an upcoming visit scheduled with Dr. Elberta Fortis on 09/12/2022. Schedule Colonoscopy with previsit appt. after obtaining clearance. Verify with Dr. Salena Saner first prior.

## 2022-09-26 NOTE — Progress Notes (Signed)
[facility] [address] [place], [place] [address] [phone]  Date:  10/02/2022   Name:  Kathleen [name]   DOB:  Aug 12, 1955   MRN:  [id number]  PCP:  Pearline Cables, MD    Chief Complaint: Annual Exam (Concerns/ questions: pt says her ablasion was successful in may for afib. Colonoscopy has been scheduled. /Foot exam is due/Flu shot today: yes/)   History of Present Illness:  Kathleen [name] is a 67 y.o. very pleasant female patient who presents with the following:  Pt seen today for CPE Last seen by myself in May at which time she was worried about her memory  History of  morbid obesity, chronic diastolic HF, DM2, hypothyroidism, hyperparathyroidism status post parathyroidectomy, HTN, HL and CAD with NSTEMI 2009, CRI. Sleep study 11/2013 was negative. Also has history of A fib, bipolar disorder managed by Dr Yetta Barre.  Stage III chronic kidney disease, patient at [facility].  She had an atrial fibrillation ablation in November 2023 but unfortunately has since had episodes of flutter She is on Xarelto due to atrial fib/flutter She had a repeat ablation per Dr Jorge Ny also in May-  this worked! She is feeling better They will continue xarelto for now   Seen in CHF clinic 6/19-  ASSESSMENT AND PLAN: 1. Chronic diastolic HF:  - Echo (7/17): EF 55-60% Grade IDD  - Echo (7/19): EF 55-60%  - Echo (2/21): EF 60-65%, RV ok  - Echo (1/23): EF 60-65%, RV ok. - TEE (5/24): EF 55-60%, RV ok - Stable NYHA I-II Volume looks good today. Doing well with sliding scale lasix - Off Entresto with low BP - Continue Lasix 80 mg daily, can take extra 40 mg PRN  - Continue Jardiance 10 mg daily. No GU symptoms  - Continue metoprolol tartrate 25 mg bid. - Check labs 2. PAF - Zio patch 3/22 - 3% AF burden - Repeat Zio 5/23 - AF burden 15% (average rate 128 in AF. 81 when in sinus). 3 runs SVT - Underwent AF ablation 12/06/21 ->  in NSR - s/p AF ablation (5/24) - Regular on exam today, ECG yesterday showed NSR - CHADSVASC = 5 - Continue metoprolol - Continue Xarelto 20 mg daily. No bleeding - CBC today 3. CAD - No s/s angina - No ASA w/ Xarelto.  - Continue statin.  4. HTN   - BP stable - GDMT as above 5. DM2 - HgbA1c 5.5 (3/24) - Continue Jardiance - Continue GLP1 6. CKD 3b - Follows with Dr. Allena Katz. - Continue SGLT2i. - Labs today 7. Obesity: - Body mass index is 31.6 kg/m. - Continue with exercises at [facility]. - Has lost 65 lbs with Mounjaro, overall down 100 lbs!  8. Snoring:  - No OSA on previous sleep study in 2015 Doing well! Follow up in 6 months with Dr. Gala Romney. ------------------------  Due for foot exam Lab Results  Component Value Date   HGBA1C 5.5 03/30/2022   Lab Results  Component Value Date   TSH 4.67 03/30/2022    Flu shot, covid booster - needs egg free high dose, she will inquire at pharmacy if this is available, will do covid today at her pharmacy Eye exam due this fall Mammo UTD Dexa can be updated  Colon- 2021, she has her colon scheduled 11/23  She is down 82 lbs in total!   Wt Readings from Last 3 Encounters:  10/02/22 211  lb 6.4 oz (95.9 kg)  09/12/22 215 lb (97.5 kg)  07/12/22 226 lb 9.6 oz (102.8 kg)   She is using mounjaro and it this is working well for her - she is on 12.5 mg      Patient Active Problem List   Diagnosis Date Noted   Diabetes mellitus without complication (HCC) 07/12/2021   Hypercoagulable state due to paroxysmal atrial fibrillation (HCC) 06/30/2021   At increased risk of breast cancer 03/01/2021   Osteopenia 12/02/2019   AKI (acute kidney injury) (HCC)    CAP (community acquired pneumonia) 03/01/2019   Atrial fibrillation with RVR (HCC) 02/28/2019   Chronic kidney disease (CKD), stage III (moderate) (HCC) 01/14/2019   Vitamin D deficiency 07/22/2018   Complete rotator cuff tear or rupture of right shoulder, not specified as  traumatic 02/27/2017   Auditory hallucination 12/13/2016   Gout 06/29/2016   Type 2 diabetes mellitus (HCC) 12/31/2015   Coronary artery dissection    Lung nodule seen on imaging study 09/24/2014   Paroxysmal atrial fibrillation (HCC) 09/11/2014   Asthmatic bronchitis with exacerbation 05/25/2014   Primary hyperparathyroidism (HCC) 04/15/2014   Chronic diastolic heart failure (HCC) 10/02/2013   Shortness of breath 08/31/2013   Venous stasis dermatitis of both lower extremities 08/19/2013   Nocturia 06/12/2013   Morbid obesity (HCC) 08/09/2012   Urinary incontinence, urge 07/11/2012   CAD (coronary artery disease) 10/31/2010   NEOPLASM OF UNCERTAIN BEHAVIOR OF SKIN 02/11/2010   Bipolar disorder (HCC) 09/14/2009   Hyperlipidemia 05/23/2008   UNSPECIFIED DISORDER OF THYROID 02/04/2008   HYPERTENSION, BENIGN ESSENTIAL 02/04/2008   MYOCARDIAL INFARCTION, HX OF 03/05/2007    Past Medical History:  Diagnosis Date   Anemia    Anxiety    Asthma    Atrial fibrillation (HCC)    Bipolar disorder (HCC)    CAD (coronary artery disease)    Cancer (HCC)    basal cell carcinoma on right hand, papilloma left breast   CHF (congestive heart failure) (HCC)    pt seen at heart/vascular spec clinic 08/14/2014    Chronic kidney disease    Coarse tremors    hands   Colon polyps    Coronary artery disease    Depression    Depression    Diabetes (HCC)    DVT (deep vein thrombosis) in pregnancy    pt. 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HCC) 02/2007   non-q-wave with second septal perforator   Hyperlipidemia    Hypertension    Hypothyroidism    history of took medication, has resolved   Knee pain, left    Lower extremity deep venous thrombosis (HCC)    20 years ago    Measles    hx of in childhood    Morbid obesity with BMI of 40.0-44.9, adult (HCC)     Pain at surgical incision    Left breast from procedure 09/11/16   Pneumonia    hx of    PONV (postoperative nausea and vomiting)    also slow to wake up   Psychiatric hospitalization 05/2008   Shingles    20 years ago    Shortness of breath dyspnea    exercise    Type 2 diabetes mellitus (HCC) 12/31/2015   Urinary incontinence    UTI (urinary tract infection)  Past Surgical History:  Procedure Laterality Date   ATRIAL FIBRILLATION ABLATION N/A 12/06/2021   Procedure: ATRIAL FIBRILLATION ABLATION;  Surgeon: Regan Lemming, MD;  Location: [facility] INVASIVE CV LAB;  Service: Cardiovascular;  Laterality: N/A;   ATRIAL FIBRILLATION ABLATION N/A 06/13/2022   Procedure: ATRIAL FIBRILLATION ABLATION;  Surgeon: Regan Lemming, MD;  Location: [facility] INVASIVE CV LAB;  Service: Cardiovascular;  Laterality: N/A;   BASAL CELL CARCINOMA EXCISION     BREAST EXCISIONAL BIOPSY Left 2018   BREAST LUMPECTOMY WITH RADIOACTIVE SEED LOCALIZATION Left 09/11/2016   Procedure: LEFT BREAST LUMPECTOMY WITH RADIOACTIVE SEED LOCALIZATION;  Surgeon: Glenna Fellows, MD;  Location: [facility] OR;  Service: General;  Laterality: Left;   BREAST MASS EXCISION     age 59, benign tumor   CARDIAC CATHETERIZATION     CARDIAC CATHETERIZATION N/A 12/30/2015   Procedure: Left Heart Cath and Coronary Angiography;  Surgeon: Lyn Records, MD;  Location[facility] INVASIVE CV LAB;  Service: Cardiovascular;  Laterality: N/A;   COLONOSCOPY  07/15/2009   [facility]   DILATATION & CURETTAGE/HYSTEROSCOPY WITH MYOSURE N/A 09/22/2016   Procedure: DILATATION & CURETTAGE/HYSTEROSCOPY WITH MYOSURE;  Surgeon: Richarda Overlie, MD;  Location: [facility] ORS;  Service: Gynecology;  Laterality: N/A;   DILATION AND CURETTAGE OF UTERUS     KNEE SURGERY     right, removed cartilage   PARATHYROIDECTOMY N/A 08/25/2014   Procedure: PARATHYROIDECTOMY;  Surgeon: Darnell Level, MD;  Location: [facility] ORS;  Service: General;  Laterality: N/A;   SHOULDER ARTHROSCOPY WITH  ROTATOR CUFF REPAIR AND SUBACROMIAL DECOMPRESSION Right 02/27/2017   Procedure: Right shoulder arthroscopic rotator cuff repair with biceps tenodesis and subacromial decompression;  Surgeon: Yolonda Kida, MD;  Location: [facility] OR;  Service: Orthopedics;  Laterality: Right;  150 mins   TEE WITHOUT CARDIOVERSION N/A 06/13/2022   Procedure: TRANSESOPHAGEAL ECHOCARDIOGRAM;  Surgeon: Dolores Patty, MD;  Location: [facility] INVASIVE CV LAB;  Service: Cardiovascular;  Laterality: N/A;   TONSILLECTOMY     TUBAL LIGATION      Social History   Tobacco Use   Smoking status: Never    Passive exposure: Past   Smokeless tobacco: Never   Tobacco comments:    Never smoker 01/03/22  Vaping Use   Vaping status: Never Used  Substance Use Topics   Alcohol use: Yes    Alcohol/week: 0.0 standard drinks of alcohol    Comment: occ.   Drug use: No    Family History  Problem Relation Age of Onset   Breast cancer Mother 27   Alcohol abuse Mother    Alzheimer's disease Mother    Lung cancer Mother    Alcohol abuse Father    Alzheimer's disease Father    Parkinson's disease Father    Alcohol abuse Maternal Grandfather    Drug abuse Maternal Grandfather    Alcohol abuse Maternal Grandmother    Alcohol abuse Paternal Grandfather    Alcohol abuse Paternal Grandmother    Schizophrenia Cousin    Diabetes Brother    Hypertension Brother    Colon cancer Neg Hx    Esophageal cancer Neg Hx    Rectal cancer Neg Hx    Stomach cancer Neg Hx     Allergies  Allergen Reactions   Tetanus-Diphtheria Toxoids Td Itching    Other reaction(s): Myalgias (intolerance) Vigorous Local Soft Tissue Reaction to TDAP   Influenza Vaccines Hives    Can eat foods with cooked eggs; CANNOT handle when part of flu vaccine, etc.    Tetanus  Toxoids     Vigorous local reaction to tdap with local pain and itching    Lamictal [Lamotrigine] Hives and Rash    Medication list has been reviewed and updated.  Current  Outpatient Medications on File Prior to Visit  Medication Sig Dispense Refill   ACCU-CHEK GUIDE test strip USE AS INSTRUCTED 300 strip 3   Accu-Chek Softclix Lancets lancets USE AS DIRECTED 300 each 3   allopurinol (ZYLOPRIM) 100 MG tablet Take 100 mg by mouth 2 (two) times daily.     benztropine (COGENTIN) 1 MG tablet Take 1 mg by mouth 2 (two) times daily.  2   Blood Glucose Calibration (ACCU-CHEK GUIDE CONTROL) LIQD 1 drop by In Vitro route as directed. E11.9 1 each 1   Blood Glucose Monitoring Suppl (ACCU-CHEK GUIDE ME) w/Device KIT 1 each by Does not apply route in the morning and at bedtime. E11.9 1 kit 0   calcitRIOL (ROCALTROL) 0.25 MCG capsule TAKE 1 CAPSULE BY MOUTH DAILY 90 capsule 1   Cholecalciferol (VITAMIN D3) 50 MCG (2000 UT) capsule Take 2,000 Units by mouth daily.     divalproex (DEPAKOTE ER) 500 MG 24 hr tablet Take 1,000 mg by mouth at bedtime.     furosemide (LASIX) 80 MG tablet Take 80 mg by mouth daily. Take 1 tablet daily     JARDIANCE 10 MG TABS tablet TAKE ONE TABLET BY MOUTH DAILY 90 tablet 3   LATUDA 60 MG TABS Take 60 mg by mouth at bedtime.      levothyroxine (SYNTHROID) 50 MCG tablet TAKE 1 TABLET BY MOUTH DAILY BEFORE BREAKFAST 90 tablet 1   metoprolol tartrate (LOPRESSOR) 25 MG tablet Take 25 mg by mouth 2 (two) times daily.     nitroGLYCERIN (NITROSTAT) 0.4 MG SL tablet Place 1 tablet (0.4 mg total) under the tongue every 5 (five) minutes as needed for chest pain. For chest pain 25 tablet 3   NON FORMULARY Place 1 drop into both eyes 2 (two) times daily. Regener-Eyes eye drops     OLANZapine (ZYPREXA) 15 MG tablet Take 15 mg by mouth at bedtime.     rosuvastatin (CRESTOR) 20 MG tablet TAKE 1 TABLET BY MOUTH DAILY 90 tablet 1   tirzepatide (MOUNJARO) 12.5 MG/0.5ML Pen Inject 12.5 mg into the skin once a week. 6 mL 1   XARELTO 20 MG TABS tablet TAKE ONE TABLET BY MOUTH AT BEDTIME 90 tablet 3   No current facility-administered medications on file prior to visit.     Review of Systems:  As per HPI- otherwise negative.   Physical Examination: Vitals:   10/02/22 0944  BP: 120/68  Pulse: 87  Temp: 97.7 F (36.5 C)  SpO2: 99%   Vitals:   10/02/22 0944  Weight: 211 lb 6.4 oz (95.9 kg)  Height: 5\' 11"  (1.803 m)   Body mass index is 29.48 kg/m. Ideal Body Weight: Weight in (lb) to have BMI = 25: 178.9  GEN: no acute distress.  Overweight but has lost significantly, looks well HEENT: Atraumatic, Normocephalic.  Ears and Nose: No external deformity. CV: RRR, No M/G/R. No JVD. No thrill. No extra heart sounds. PULM: CTA B, no wheezes, crackles, rhonchi. No retractions. No resp. distress. No accessory muscle use. ABD: S, NT, ND, +BS. No rebound. No HSM. EXTR: No c/c/e PSYCH: Normally interactive. Conversant.  Foot exam completed today  Assessment and Plan: Physical exam  Type 2 diabetes mellitus without complication, without long-term current use of insulin (HCC) - Plan:  Comprehensive metabolic panel, Hemoglobin A1c  Acquired hypothyroidism - Plan: TSH  Pure hypercholesterolemia  HYPERTENSION, BENIGN ESSENTIAL - Plan: CBC, Comprehensive metabolic panel  Need for influenza vaccination - Plan: CANCELED: Flu Vaccine Trivalent High Dose (Fluad)  Estrogen deficiency - Plan: DG Bone Density  Physical exam today- encouraged healthy diet and exercise routine Will plan further follow- up pending labs. Ordered bone density Blood pressure well-controlled  Signed Abbe Amsterdam, MD  Received labs as below, message to patient  Results for orders placed or performed in visit on 10/02/22  CBC  Result Value Ref Range   WBC 4.8 4.0 - 10.5 K/uL   RBC 5.16 (H) 3.87 - 5.11 Mil/uL   Platelets 125.0 (L) 150.0 - 400.0 K/uL   Hemoglobin 15.7 (H) 12.0 - 15.0 g/dL   HCT 47.8 (H) 29.5 - 62.1 %   MCV 92.8 78.0 - 100.0 fl   MCHC 32.8 30.0 - 36.0 g/dL   RDW 30.8 65.7 - 84.6 %  Comprehensive metabolic panel  Result Value Ref Range   Sodium 141  135 - 145 mEq/L   Potassium 3.8 3.5 - 5.1 mEq/L   Chloride 95 (L) 96 - 112 mEq/L   CO2 36 (H) 19 - 32 mEq/L   Glucose, Bld 88 70 - 99 mg/dL   BUN 33 (H) 6 - 23 mg/dL   Creatinine, Ser 9.62 (H) 0.40 - 1.20 mg/dL   Total Bilirubin 0.7 0.2 - 1.2 mg/dL   Alkaline Phosphatase 103 39 - 117 U/L   AST 26 0 - 37 U/L   ALT 28 0 - 35 U/L   Total Protein 6.8 6.0 - 8.3 g/dL   Albumin 4.4 3.5 - 5.2 g/dL   GFR 95.28 (L) >41.32 mL/min   Calcium 10.3 8.4 - 10.5 mg/dL  Hemoglobin G4W  Result Value Ref Range   Hgb A1c MFr Bld 5.4 4.6 - 6.5 %  TSH  Result Value Ref Range   TSH 1.18 0.35 - 5.50 uIU/mL

## 2022-09-26 NOTE — Patient Instructions (Addendum)
Good to see you again today!  Great job with weight loss  I think your plan to start strength training is wonderful, this is also good for your bone density  I ordered a bone density scan, please stop by imaging on the ground floor and schedule this test  Recommend flu shot, COVID booster this fall.  I am sorry we did not have a high dose egg free flu shot! I am not 100% sure this product exists-please check with [facility] to see what shot you had last year.  If they gave you the high dose with a and you did fine last year should be fine to do it again

## 2022-10-02 ENCOUNTER — Ambulatory Visit (INDEPENDENT_AMBULATORY_CARE_PROVIDER_SITE_OTHER): Payer: Medicare PPO | Admitting: Family Medicine

## 2022-10-02 ENCOUNTER — Encounter: Payer: Self-pay | Admitting: Family Medicine

## 2022-10-02 VITALS — BP 120/68 | HR 87 | Temp 97.7°F | Ht 71.0 in | Wt 211.4 lb

## 2022-10-02 DIAGNOSIS — E039 Hypothyroidism, unspecified: Secondary | ICD-10-CM

## 2022-10-02 DIAGNOSIS — D751 Secondary polycythemia: Secondary | ICD-10-CM

## 2022-10-02 DIAGNOSIS — E7800 Pure hypercholesterolemia, unspecified: Secondary | ICD-10-CM | POA: Diagnosis not present

## 2022-10-02 DIAGNOSIS — Z7984 Long term (current) use of oral hypoglycemic drugs: Secondary | ICD-10-CM

## 2022-10-02 DIAGNOSIS — I10 Essential (primary) hypertension: Secondary | ICD-10-CM

## 2022-10-02 DIAGNOSIS — D696 Thrombocytopenia, unspecified: Secondary | ICD-10-CM

## 2022-10-02 DIAGNOSIS — Z0000 Encounter for general adult medical examination without abnormal findings: Secondary | ICD-10-CM | POA: Diagnosis not present

## 2022-10-02 DIAGNOSIS — Z23 Encounter for immunization: Secondary | ICD-10-CM

## 2022-10-02 DIAGNOSIS — E119 Type 2 diabetes mellitus without complications: Secondary | ICD-10-CM | POA: Diagnosis not present

## 2022-10-02 DIAGNOSIS — E2839 Other primary ovarian failure: Secondary | ICD-10-CM | POA: Diagnosis not present

## 2022-10-02 LAB — CBC
HCT: 47.9 % — ABNORMAL HIGH (ref 36.0–46.0)
Hemoglobin: 15.7 g/dL — ABNORMAL HIGH (ref 12.0–15.0)
MCHC: 32.8 g/dL (ref 30.0–36.0)
MCV: 92.8 fl (ref 78.0–100.0)
Platelets: 125 10*3/uL — ABNORMAL LOW (ref 150.0–400.0)
RBC: 5.16 Mil/uL — ABNORMAL HIGH (ref 3.87–5.11)
RDW: 15.1 % (ref 11.5–15.5)
WBC: 4.8 10*3/uL (ref 4.0–10.5)

## 2022-10-02 LAB — COMPREHENSIVE METABOLIC PANEL
ALT: 28 U/L (ref 0–35)
AST: 26 U/L (ref 0–37)
Albumin: 4.4 g/dL (ref 3.5–5.2)
Alkaline Phosphatase: 103 U/L (ref 39–117)
BUN: 33 mg/dL — ABNORMAL HIGH (ref 6–23)
CO2: 36 meq/L — ABNORMAL HIGH (ref 19–32)
Calcium: 10.3 mg/dL (ref 8.4–10.5)
Chloride: 95 meq/L — ABNORMAL LOW (ref 96–112)
Creatinine, Ser: 1.63 mg/dL — ABNORMAL HIGH (ref 0.40–1.20)
GFR: 32.55 mL/min — ABNORMAL LOW (ref >60.00)
Glucose, Bld: 88 mg/dL (ref 70–99)
Potassium: 3.8 meq/L (ref 3.5–5.1)
Sodium: 141 meq/L (ref 135–145)
Total Bilirubin: 0.7 mg/dL (ref 0.2–1.2)
Total Protein: 6.8 g/dL (ref 6.0–8.3)

## 2022-10-02 LAB — HEMOGLOBIN A1C: Hgb A1c MFr Bld: 5.4 % (ref 4.6–6.5)

## 2022-10-02 LAB — TSH: TSH: 1.18 u[IU]/mL (ref 0.35–5.50)

## 2022-10-09 ENCOUNTER — Ambulatory Visit (HOSPITAL_BASED_OUTPATIENT_CLINIC_OR_DEPARTMENT_OTHER)
Admission: RE | Admit: 2022-10-09 | Discharge: 2022-10-09 | Disposition: A | Discharge status: Home / Self Care | Payer: Medicare PPO | Source: Ambulatory Visit | Attending: Family Medicine | Admitting: Family Medicine

## 2022-10-09 ENCOUNTER — Encounter: Payer: Self-pay | Admitting: Family Medicine

## 2022-10-09 DIAGNOSIS — M85851 Other specified disorders of bone density and structure, right thigh: Secondary | ICD-10-CM | POA: Diagnosis not present

## 2022-10-09 DIAGNOSIS — E2839 Other primary ovarian failure: Secondary | ICD-10-CM | POA: Insufficient documentation

## 2022-10-10 ENCOUNTER — Inpatient Hospital Stay: Payer: Medicare PPO | Admitting: Medical Oncology

## 2022-10-10 ENCOUNTER — Other Ambulatory Visit: Payer: Self-pay

## 2022-10-10 ENCOUNTER — Inpatient Hospital Stay: Payer: Medicare PPO | Attending: Medical Oncology

## 2022-10-10 ENCOUNTER — Encounter: Payer: Self-pay | Admitting: Medical Oncology

## 2022-10-10 ENCOUNTER — Encounter: Payer: Self-pay | Admitting: Family Medicine

## 2022-10-10 VITALS — BP 93/67 | HR 89 | Temp 98.1°F | Resp 18 | Ht 71.0 in | Wt 209.0 lb

## 2022-10-10 DIAGNOSIS — D696 Thrombocytopenia, unspecified: Secondary | ICD-10-CM | POA: Diagnosis not present

## 2022-10-10 DIAGNOSIS — G4733 Obstructive sleep apnea (adult) (pediatric): Secondary | ICD-10-CM | POA: Diagnosis not present

## 2022-10-10 DIAGNOSIS — D582 Other hemoglobinopathies: Secondary | ICD-10-CM

## 2022-10-10 DIAGNOSIS — D751 Secondary polycythemia: Secondary | ICD-10-CM | POA: Insufficient documentation

## 2022-10-10 LAB — CBC WITH DIFFERENTIAL (CANCER CENTER ONLY)
Abs Immature Granulocytes: 0.05 10*3/uL (ref 0.00–0.07)
Basophils Absolute: 0 10*3/uL (ref 0.0–0.1)
Basophils Relative: 0 %
Eosinophils Absolute: 0.1 10*3/uL (ref 0.0–0.5)
Eosinophils Relative: 1 %
HCT: 47.6 % — ABNORMAL HIGH (ref 36.0–46.0)
Hemoglobin: 15.8 g/dL — ABNORMAL HIGH (ref 12.0–15.0)
Immature Granulocytes: 1 %
Lymphocytes Relative: 26 %
Lymphs Abs: 1.3 10*3/uL (ref 0.7–4.0)
MCH: 30 pg (ref 26.0–34.0)
MCHC: 33.2 g/dL (ref 30.0–36.0)
MCV: 90.5 fL (ref 80.0–100.0)
Monocytes Absolute: 0.5 10*3/uL (ref 0.1–1.0)
Monocytes Relative: 10 %
Neutro Abs: 3 10*3/uL (ref 1.7–7.7)
Neutrophils Relative %: 62 %
Platelet Count: 136 10*3/uL — ABNORMAL LOW (ref 150–400)
RBC: 5.26 MIL/uL — ABNORMAL HIGH (ref 3.87–5.11)
RDW: 14.1 % (ref 11.5–15.5)
WBC Count: 5 10*3/uL (ref 4.0–10.5)
nRBC: 0 % (ref 0.0–0.2)

## 2022-10-10 LAB — FOLATE: Folate: 21.2 ng/mL (ref >5.9)

## 2022-10-10 LAB — HIV ANTIBODY (ROUTINE TESTING W REFLEX): HIV Screen 4th Generation wRfx: NONREACTIVE

## 2022-10-10 LAB — RETICULOCYTES
Immature Retic Fract: 10 % (ref 2.3–15.9)
RBC.: 5.15 MIL/uL — ABNORMAL HIGH (ref 3.87–5.11)
Retic Count, Absolute: 74.7 10*3/uL (ref 19.0–186.0)
Retic Ct Pct: 1.5 % (ref 0.4–3.1)

## 2022-10-10 LAB — CMP (CANCER CENTER ONLY)
ALT: 35 U/L (ref 0–44)
AST: 30 U/L (ref 15–41)
Albumin: 4.1 g/dL (ref 3.5–5.0)
Alkaline Phosphatase: 94 U/L (ref 38–126)
Anion gap: 11 (ref 5–15)
BUN: 32 mg/dL — ABNORMAL HIGH (ref 8–23)
CO2: 31 mmol/L (ref 22–32)
Calcium: 9.9 mg/dL (ref 8.9–10.3)
Chloride: 95 mmol/L — ABNORMAL LOW (ref 98–111)
Creatinine: 1.62 mg/dL — ABNORMAL HIGH (ref 0.44–1.00)
GFR, Estimated: 35 mL/min — ABNORMAL LOW (ref >60)
Glucose, Bld: 97 mg/dL (ref 70–99)
Potassium: 4.5 mmol/L (ref 3.5–5.1)
Sodium: 137 mmol/L (ref 135–145)
Total Bilirubin: 0.5 mg/dL (ref 0.3–1.2)
Total Protein: 7.3 g/dL (ref 6.5–8.1)

## 2022-10-10 LAB — FERRITIN: Ferritin: 197 ng/mL (ref 11–307)

## 2022-10-10 LAB — HEPATITIS B SURFACE ANTIBODY,QUALITATIVE: Hep B S Ab: REACTIVE — AB

## 2022-10-10 LAB — VITAMIN B12: Vitamin B-12: 861 pg/mL (ref 180–914)

## 2022-10-10 LAB — HEPATITIS B SURFACE ANTIGEN: Hepatitis B Surface Ag: NONREACTIVE

## 2022-10-10 LAB — LACTATE DEHYDROGENASE: LDH: 125 U/L (ref 98–192)
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
## 2022-10-11 ENCOUNTER — Telehealth: Payer: Self-pay | Admitting: Medical Oncology

## 2022-10-11 NOTE — Telephone Encounter (Signed)
Patient has been scheduled. Aware of appt date and time.    Follow-Up Information  Check out comments: Labs today RTC 1 month APP, labs after -[place]

## 2022-10-12 DIAGNOSIS — N184 Chronic kidney disease, stage 4 (severe): Secondary | ICD-10-CM | POA: Diagnosis not present

## 2022-10-13 ENCOUNTER — Telehealth (HOSPITAL_BASED_OUTPATIENT_CLINIC_OR_DEPARTMENT_OTHER): Payer: Self-pay

## 2022-10-16 DIAGNOSIS — I129 Hypertensive chronic kidney disease with stage 1 through stage 4 chronic kidney disease, or unspecified chronic kidney disease: Secondary | ICD-10-CM | POA: Diagnosis not present

## 2022-10-16 DIAGNOSIS — E1122 Type 2 diabetes mellitus with diabetic chronic kidney disease: Secondary | ICD-10-CM | POA: Diagnosis not present

## 2022-10-16 DIAGNOSIS — D631 Anemia in chronic kidney disease: Secondary | ICD-10-CM | POA: Diagnosis not present

## 2022-10-16 DIAGNOSIS — N1832 Chronic kidney disease, stage 3b: Secondary | ICD-10-CM | POA: Diagnosis not present

## 2022-10-16 DIAGNOSIS — E210 Primary hyperparathyroidism: Secondary | ICD-10-CM | POA: Diagnosis not present

## 2022-10-18 LAB — JAK2 (INCLUDING V617F AND EXON 12), MPL,& CALR W/RFL MPN PANEL (NGS)

## 2022-10-18 LAB — BCR ABL1 FISH (GENPATH)

## 2022-10-19 ENCOUNTER — Ambulatory Visit (HOSPITAL_BASED_OUTPATIENT_CLINIC_OR_DEPARTMENT_OTHER)
Admission: RE | Admit: 2022-10-19 | Discharge: 2022-10-19 | Disposition: A | Discharge status: Home / Self Care | Payer: Medicare PPO | Source: Ambulatory Visit | Attending: Medical Oncology | Admitting: Medical Oncology

## 2022-10-19 DIAGNOSIS — D696 Thrombocytopenia, unspecified: Secondary | ICD-10-CM | POA: Insufficient documentation

## 2022-10-19 DIAGNOSIS — R161 Splenomegaly, not elsewhere classified: Secondary | ICD-10-CM | POA: Diagnosis not present

## 2022-10-25 ENCOUNTER — Ambulatory Visit (AMBULATORY_SURGERY_CENTER): Payer: Medicare PPO

## 2022-10-25 VITALS — Ht 71.0 in | Wt 207.0 lb

## 2022-10-25 DIAGNOSIS — Z860100 Personal history of colon polyps, unspecified: Secondary | ICD-10-CM

## 2022-10-25 MED ORDER — PEG 3350-KCL-NA BICARB-NACL 420 G PO SOLR
4000.0000 mL | Freq: Once | ORAL | 0 refills | Status: AC
Start: 2022-10-25 — End: 2022-10-25

## 2022-10-25 NOTE — Progress Notes (Signed)
Pre visit completed via phone call; Patient verified name, DOB, and address; No egg or soy allergy known to patient;  No issues known to pt with past sedation with any surgeries or procedures; Patient denies ever being told they had issues or difficulty with intubation;  No FH of Malignant Hyperthermia; Pt is not on diet pills; Pt is not on home 02;  Pt on blood thinners=Xarelto for AFIB;  Pt reports issues with constipation -patient reports she is having maybe one bowel movement per week-patient was advised to increase oral fluids, activity, fruits/veggies that are allowed prior to prep starting, can also take OTC PRN stool softener softener/laxative if needed (was highly advised); AFIB- on Xarelto Have any cardiac testing pending--NO Insurance verified during PV appt--- [employer] Medicare Pt can ambulate without assistance;  Pt denies use of chewing tobacco; Discussed diabetic/weight loss medication holds; Discussed NSAID holds; Checked BMI to be less than 50; Pt instructed to use Singlecare.com or GoodRx for a price reduction on prep;  Patient's chart reviewed by Cathlyn Parsons CNRA prior to previsit and patient appropriate for the [facility];  Pre visit completed and red dot placed by patient's name on their procedure day (on provider's schedule);  Instructions sent to MyChart as well as printed and mailed to the patient per her request;

## 2022-11-01 ENCOUNTER — Encounter: Payer: Self-pay | Admitting: Gastroenterology

## 2022-11-03 ENCOUNTER — Other Ambulatory Visit: Payer: Self-pay | Admitting: Internal Medicine

## 2022-11-03 ENCOUNTER — Other Ambulatory Visit: Payer: Self-pay | Admitting: Family Medicine

## 2022-11-03 DIAGNOSIS — R7989 Other specified abnormal findings of blood chemistry: Secondary | ICD-10-CM

## 2022-11-07 ENCOUNTER — Inpatient Hospital Stay: Payer: Medicare PPO

## 2022-11-07 ENCOUNTER — Encounter: Payer: Self-pay | Admitting: Family Medicine

## 2022-11-07 ENCOUNTER — Inpatient Hospital Stay: Payer: Medicare PPO | Attending: Medical Oncology | Admitting: Medical Oncology

## 2022-11-07 ENCOUNTER — Other Ambulatory Visit: Payer: Self-pay

## 2022-11-07 ENCOUNTER — Encounter: Payer: Self-pay | Admitting: Medical Oncology

## 2022-11-07 VITALS — BP 97/69 | HR 92 | Temp 97.9°F | Resp 18 | Ht 71.0 in | Wt 206.0 lb

## 2022-11-07 DIAGNOSIS — D582 Other hemoglobinopathies: Secondary | ICD-10-CM

## 2022-11-07 DIAGNOSIS — G4730 Sleep apnea, unspecified: Secondary | ICD-10-CM | POA: Insufficient documentation

## 2022-11-07 DIAGNOSIS — D696 Thrombocytopenia, unspecified: Secondary | ICD-10-CM

## 2022-11-07 DIAGNOSIS — D751 Secondary polycythemia: Secondary | ICD-10-CM | POA: Insufficient documentation

## 2022-11-07 DIAGNOSIS — R490 Dysphonia: Secondary | ICD-10-CM

## 2022-11-07 LAB — SAMPLE TO BLOOD BANK

## 2022-11-07 LAB — CMP (CANCER CENTER ONLY)
ALT: 22 U/L (ref 0–44)
AST: 22 U/L (ref 15–41)
Albumin: 4.2 g/dL (ref 3.5–5.0)
Alkaline Phosphatase: 89 U/L (ref 38–126)
Anion gap: 9 (ref 5–15)
BUN: 30 mg/dL — ABNORMAL HIGH (ref 8–23)
CO2: 36 mmol/L — ABNORMAL HIGH (ref 22–32)
Calcium: 10.7 mg/dL — ABNORMAL HIGH (ref 8.9–10.3)
Chloride: 98 mmol/L (ref 98–111)
Creatinine: 1.82 mg/dL — ABNORMAL HIGH (ref 0.44–1.00)
GFR, Estimated: 30 mL/min — ABNORMAL LOW (ref >60)
Glucose, Bld: 95 mg/dL (ref 70–99)
Potassium: 4.3 mmol/L (ref 3.5–5.1)
Sodium: 143 mmol/L (ref 135–145)
Total Bilirubin: 0.5 mg/dL (ref 0.3–1.2)
Total Protein: 6.9 g/dL (ref 6.5–8.1)

## 2022-11-07 LAB — CBC WITH DIFFERENTIAL (CANCER CENTER ONLY)
Abs Immature Granulocytes: 0.02 10*3/uL (ref 0.00–0.07)
Basophils Absolute: 0 10*3/uL (ref 0.0–0.1)
Basophils Relative: 0 %
Eosinophils Absolute: 0.1 10*3/uL (ref 0.0–0.5)
Eosinophils Relative: 2 %
HCT: 47.6 % — ABNORMAL HIGH (ref 36.0–46.0)
Hemoglobin: 16.2 g/dL — ABNORMAL HIGH (ref 12.0–15.0)
Immature Granulocytes: 0 %
Lymphocytes Relative: 25 %
Lymphs Abs: 1.2 10*3/uL (ref 0.7–4.0)
MCH: 30.9 pg (ref 26.0–34.0)
MCHC: 34 g/dL (ref 30.0–36.0)
MCV: 90.7 fL (ref 80.0–100.0)
Monocytes Absolute: 0.5 10*3/uL (ref 0.1–1.0)
Monocytes Relative: 10 %
Neutro Abs: 3 10*3/uL (ref 1.7–7.7)
Neutrophils Relative %: 63 %
Platelet Count: 114 10*3/uL — ABNORMAL LOW (ref 150–400)
RBC: 5.25 MIL/uL — ABNORMAL HIGH (ref 3.87–5.11)
RDW: 14.2 % (ref 11.5–15.5)
WBC Count: 4.7 10*3/uL (ref 4.0–10.5)
nRBC: 0 % (ref 0.0–0.2)

## 2022-11-07 LAB — SAVE SMEAR(SSMR), FOR PROVIDER SLIDE REVIEW

## 2022-11-07 NOTE — Progress Notes (Signed)
Hematology and Oncology Follow Up Visit  Kathleen [name] [id number] 1955-12-15 67 y.o. 11/07/2022  Past Medical History:  Diagnosis Date   Anemia    Anxiety    Asthma    Atrial fibrillation (HCC)    Bipolar disorder (HCC)    CAD (coronary artery disease)    Cancer (HCC)    basal cell carcinoma on right hand, papilloma left breast   CHF (congestive heart failure) (HCC)    pt seen at [facility] 08/14/2014    Chronic kidney disease    Coarse tremors    hands   Colon polyps    Coronary artery disease    Depression    Depression    Diabetes (HCC)    DVT (deep vein thrombosis) in pregnancy    pt. 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HCC) 02/2007   non-q-wave with second septal perforator   Hyperlipidemia    on meds   Hypertension    on meds   Hypothyroidism    history of took medication, has resolved   Knee pain, left    Lower extremity deep venous thrombosis (HCC)    20 years ago    Measles    hx of in childhood    Morbid obesity with BMI of 40.0-44.9, adult (HCC)    Osteopenia    Pain at surgical incision    Left breast from procedure 09/11/16   Pneumonia    hx of    PONV (postoperative nausea and vomiting)    also slow to wake up   Psychiatric hospitalization 05/2008   Shingles    20 years ago    Shortness of breath dyspnea    exercise    Sleep apnea    uses CPAP   Type 2 diabetes mellitus (HCC) 12/31/2015   Urinary incontinence    UTI (urinary tract infection)     Principle Diagnosis:  Thrombocytopenia Secondary Polycythemia    Current Therapy:   Xarelto Phlebotomy to maintain HCT < 60%     Interim History:  Kathleen [name] is back for follow-up for secondary polycythemia and thrombocytopenia. She was initially seen on 10/10/2022 at which time her Hgb was 15.8, Hct was 47.6.   She had negative MPN work up with  a normal BCR ABLE fish as well as a negative JAK2 panel. She was negative for nutritional deficiencies (B12, folate, iron), She was negative for Hepatitis B/C/HIV. Erythropoietin level was normal at 8. LDH was normal at 12. Kidney function is declined  with a GFR of 35. She is followed by Dr. Allena Katz for this and is seen every 3-4 months. She sees Dr. Joselyn Glassman for cardiology. She has another Camnitz for her A. Fib.   She is a non-smoker She has no history of COPD She does have a diagnosis of sleep apnea- she uses this nightly- sometimes she takes this off during the night.  She does not take testosterone supplementation  She had a ABD [place] completed on 10/19/2022- results are still pending.   There has been no bleeding to her knowledge: denies epistaxis, gingivitis, hemoptysis, hematemesis, hematuria, melena, excessive bruising, blood donation.   No night sweats, unintentional weight loss.     Wt Readings from Last 3 Encounters:  11/07/22 206 lb 0.6 oz (93.5 kg)  10/25/22 207 lb (93.9 kg)  10/10/22 209 lb (94.8 kg)     Medications:   Current Outpatient Medications:    ACCU-CHEK GUIDE test strip, USE AS INSTRUCTED, Disp: 300 strip, Rfl: 3   Accu-Chek Softclix Lancets lancets, USE AS DIRECTED, Disp: 300 each, Rfl: 3   allopurinol (ZYLOPRIM) 100 MG tablet, Take 1 tablet (100 mg total) by mouth 2 (two) times daily., Disp: 180 tablet, Rfl: 1   benztropine (COGENTIN) 1 MG tablet, Take 1 mg by mouth 2 (two) times daily., Disp: , Rfl: 2   Blood Glucose Calibration (ACCU-CHEK GUIDE CONTROL) LIQD, 1 drop by In Vitro route as directed. E11.9, Disp: 1 each, Rfl: 1   Blood Glucose Monitoring Suppl (ACCU-CHEK GUIDE ME) w/Device KIT, 1 each by Does not apply route in the morning and at bedtime. E11.9, Disp: 1 kit, Rfl: 0   calcitRIOL (ROCALTROL) 0.25 MCG capsule, Take 1 capsule (0.25 mcg total) by mouth daily., Disp: 90 capsule, Rfl: 1   Cholecalciferol (VITAMIN D3) 50 MCG (2000 UT) capsule, Take 2,000  Units by mouth daily., Disp: , Rfl:    divalproex (DEPAKOTE ER) 500 MG 24 hr tablet, Take 1,000 mg by mouth at bedtime., Disp: , Rfl:    furosemide (LASIX) 80 MG tablet, Take 80 mg by mouth daily. Take 1 tablet daily, Disp: , Rfl:    JARDIANCE 10 MG TABS tablet, TAKE ONE TABLET BY MOUTH DAILY, Disp: 90 tablet, Rfl: 3   LATUDA 60 MG TABS, Take 60 mg by mouth at bedtime. , Disp: , Rfl:    levothyroxine (SYNTHROID) 50 MCG tablet, TAKE 1 TABLET BY MOUTH DAILY BEFORE BREAKFAST, Disp: 90 tablet, Rfl: 1   metoprolol tartrate (LOPRESSOR) 25 MG tablet, Take 25 mg by mouth 2 (two) times daily., Disp: , Rfl:    NON FORMULARY, Place 1 drop into both eyes 2 (two) times daily. Regener-Eyes eye drops, Disp: , Rfl:    OLANZapine (ZYPREXA) 15 MG tablet, Take 15 mg by mouth at bedtime., Disp: , Rfl:    rosuvastatin (CRESTOR) 20 MG tablet, TAKE 1 TABLET BY MOUTH DAILY, Disp: 90 tablet, Rfl: 1   tirzepatide (MOUNJARO) 12.5 MG/0.5ML Pen, Inject 12.5 mg into the skin once a week., Disp: 6 mL, Rfl: 1   XARELTO 20 MG TABS tablet, TAKE 1 TABLET BY MOUTH AT BEDTIME, Disp: 90 tablet, Rfl: 3   nitroGLYCERIN (NITROSTAT) 0.4 MG SL tablet, Place 1 tablet (0.4 mg total) under the tongue every 5 (five) minutes as needed for chest pain. For chest pain (Patient not taking: Reported on 11/07/2022), Disp: 25 tablet, Rfl: 3   polyethylene glycol-electrolytes (NULYTELY) 420 g solution, Take 4,000 mLs by mouth once. (Patient not taking: Reported on 11/07/2022), Disp: , Rfl:   Allergies:  Allergies  Allergen Reactions   Influenza Vaccines Hives    Can eat foods with cooked eggs; CANNOT handle when part of flu vaccine, etc.    Tetanus Toxoids Itching and Swelling    Vigorous local reaction to tdap with local pain   Tetanus-Diphtheria Toxoids Td Itching and Swelling    Other reaction(s): Myalgias (intolerance) Vigorous Local Soft Tissue Reaction to TDAP   Lamictal [Lamotrigine] Hives and Rash    Past Medical History, Surgical  history, Social history, and Family History were reviewed and updated.  Review of Systems: As stated above in HPI   Physical Exam:  height is 5\' 11"  (1.803 m) and weight is 206 lb 0.6 oz (93.5 kg). Her oral temperature is 97.9 F (36.6 C). Her blood pressure is 97/69  and her pulse is 92. Her respiration is 18 and oxygen saturation is 100%.   Physical Exam General: NAD Cardiovascular: regular rate and rhythm Pulmonary: clear ant fields Abdomen: soft, nontender, + bowel sounds GU: no suprapubic tenderness Extremities: no edema, no joint deformities Skin: no rashes Neurological: Weakness but otherwise nonfocal   Lab Results  Component Value Date   WBC 4.7 11/07/2022   HGB 16.2 (H) 11/07/2022   HCT 47.6 (H) 11/07/2022   MCV 90.7 11/07/2022   PLT 114 (L) 11/07/2022     Chemistry      Component Value Date/Time   NA 143 11/07/2022 1022   NA 141 06/05/2022 1554   K 4.3 11/07/2022 1022   CL 98 11/07/2022 1022   CL 104 07/11/2017 0000   CO2 36 (H) 11/07/2022 1022   CO2 20 07/11/2017 0000   BUN 30 (H) 11/07/2022 1022   BUN 30 (H) 06/05/2022 1554   CREATININE 1.82 (H) 11/07/2022 1022   CREATININE 1.64 (H) 07/31/2018 0954   GLU 110 07/10/2017 0000      Component Value Date/Time   CALCIUM 10.7 (H) 11/07/2022 1022   CALCIUM 9.5 07/10/2017 0000   ALKPHOS 89 11/07/2022 1022   AST 22 11/07/2022 1022   ALT 22 11/07/2022 1022   BILITOT 0.5 11/07/2022 1022     Encounter Diagnoses  Name Primary?   Thrombocytopenia (HCC) Yes   Elevated hemoglobin (HCC)     Assessment and Plan- Patient is a 67 y.o. female with secondary polycythemia and thrombocytopenia thought to be secondary to her OSA, lung, heart and kidney disease. JAK2 and BCR ABL negative. Negative for Hep C/B/HIV. She is on Xarelto for her history of A. Fib and CAD.   Today labs show a slightly increased HGB of 16.2, HCT of 47.6 and Platelet count of 114. [place] report shows very mild splenomegaly. Question if her current  medications are playing a role (Depakote, Zyprexa). Reviewed case with Dr. Myna Hidalgo who agrees with continued monitoring.  She will continue with her specialists.  At this time no phlebotomy is reccommended.   Disposition: RTC 6 months APP, labs ( CBC, CMP)-[place]   Clent Jacks PA-C 10/15/20241:38 PM

## 2022-11-09 ENCOUNTER — Encounter: Payer: Self-pay | Admitting: Gastroenterology

## 2022-11-15 ENCOUNTER — Encounter: Payer: Self-pay | Admitting: Gastroenterology

## 2022-11-15 ENCOUNTER — Ambulatory Visit: Payer: Medicare PPO | Admitting: Gastroenterology

## 2022-11-15 VITALS — BP 117/64 | HR 77 | Temp 97.5°F | Resp 18 | Ht 71.0 in | Wt 207.0 lb

## 2022-11-15 DIAGNOSIS — I509 Heart failure, unspecified: Secondary | ICD-10-CM | POA: Diagnosis not present

## 2022-11-15 DIAGNOSIS — Z860101 Personal history of adenomatous and serrated colon polyps: Secondary | ICD-10-CM | POA: Diagnosis not present

## 2022-11-15 DIAGNOSIS — Z09 Encounter for follow-up examination after completed treatment for conditions other than malignant neoplasm: Secondary | ICD-10-CM

## 2022-11-15 DIAGNOSIS — Z860100 Personal history of colon polyps, unspecified: Secondary | ICD-10-CM | POA: Diagnosis not present

## 2022-11-15 DIAGNOSIS — D125 Benign neoplasm of sigmoid colon: Secondary | ICD-10-CM

## 2022-11-15 DIAGNOSIS — I2510 Atherosclerotic heart disease of native coronary artery without angina pectoris: Secondary | ICD-10-CM | POA: Diagnosis not present

## 2022-11-15 DIAGNOSIS — F319 Bipolar disorder, unspecified: Secondary | ICD-10-CM | POA: Diagnosis not present

## 2022-11-15 DIAGNOSIS — K5730 Diverticulosis of large intestine without perforation or abscess without bleeding: Secondary | ICD-10-CM | POA: Diagnosis not present

## 2022-11-15 DIAGNOSIS — K635 Polyp of colon: Secondary | ICD-10-CM | POA: Diagnosis not present

## 2022-11-15 DIAGNOSIS — E039 Hypothyroidism, unspecified: Secondary | ICD-10-CM | POA: Diagnosis not present

## 2022-11-15 DIAGNOSIS — E119 Type 2 diabetes mellitus without complications: Secondary | ICD-10-CM | POA: Diagnosis not present

## 2022-11-15 DIAGNOSIS — Z1211 Encounter for screening for malignant neoplasm of colon: Secondary | ICD-10-CM | POA: Diagnosis not present

## 2022-11-15 MED ORDER — SODIUM CHLORIDE 0.9 % IV SOLN: 500.0000 mL | Freq: Once | INTRAVENOUS | Status: DC

## 2022-11-15 NOTE — Patient Instructions (Signed)
Please read handouts provided. Continue present medications. Await pathology results. Resume Xarelto at prior dose tomorrow. Return to GI office as needed.  YOU HAD AN ENDOSCOPIC PROCEDURE TODAY AT THE [facility]:   Refer to the procedure report that was given to you for any specific questions about what was found during the examination.  If the procedure report does not answer your questions, please call your gastroenterologist to clarify.  If you requested that your care partner not be given the details of your procedure findings, then the procedure report has been included in a sealed envelope for you to review at your convenience later.  YOU SHOULD EXPECT: Some feelings of bloating in the abdomen. Passage of more gas than usual.  Walking can help get rid of the air that was put into your GI tract during the procedure and reduce the bloating. If you had a lower endoscopy (such as a colonoscopy or flexible sigmoidoscopy) you may notice spotting of blood in your stool or on the toilet paper. If you underwent a bowel prep for your procedure, you may not have a normal bowel movement for a few days.  Please Note:  You might notice some irritation and congestion in your nose or some drainage.  This is from the oxygen used during your procedure.  There is no need for concern and it should clear up in a day or so.  SYMPTOMS TO REPORT IMMEDIATELY:  Following lower endoscopy (colonoscopy or flexible sigmoidoscopy):  Excessive amounts of blood in the stool  Significant tenderness or worsening of abdominal pains  Swelling of the abdomen that is new, acute  Fever of 100F or higher.  For urgent or emergent issues, a gastroenterologist can be reached at any hour by calling [phone]. Do not use MyChart messaging for urgent concerns.    DIET:  We do recommend a small meal at first, but then you may proceed to your regular diet.  Drink plenty of fluids but you should avoid alcoholic  beverages for 24 hours.  ACTIVITY:  You should plan to take it easy for the rest of today and you should NOT DRIVE or use heavy machinery until tomorrow (because of the sedation medicines used during the test).    FOLLOW UP: Our staff will call the number listed on your records the next business day following your procedure.  We will call around 7:15- 8:00 am to check on you and address any questions or concerns that you may have regarding the information given to you following your procedure. If we do not reach you, we will leave a message.     If any biopsies were taken you will be contacted by phone or by letter within the next 1-3 weeks.  Please call us at [phone] if you have not heard about the biopsies in 3 weeks.    SIGNATURES/CONFIDENTIALITY: You and/or your care partner have signed paperwork which will be entered into your electronic medical record.  These signatures attest to the fact that that the information above on your After Visit Summary has been reviewed and is understood.  Full responsibility of the confidentiality of this discharge information lies with you and/or your care-partner.

## 2022-11-15 NOTE — Progress Notes (Signed)
Pt's states no medical or surgical changes since previsit or office visit. 
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
## 2022-11-15 NOTE — Progress Notes (Signed)
Vss nad trans to pacu 

## 2022-11-15 NOTE — Op Note (Signed)
[facility] Patient Name: Kathleen [name] Procedure Date: 11/15/2022 8:08 AM MRN: [id number] Endoscopist: Doristine Locks , MD, [id number] Age: 67 Referring MD:  Date of Birth: 1955-03-17 Gender: Female Account #: [id number] Procedure:                Colonoscopy Indications:              High risk colon cancer surveillance: Personal                            history of colonic polyps                           Last colonoscopy was 03/2019 and notable for 4                            subcentimeter adenomas and 2 diminutive rectal                            hyperplastic polyps, with recommendation to repeat                            in 3 years. Medicines:                Monitored Anesthesia Care Procedure:                Pre-Anesthesia Assessment:                           - Prior to the procedure, a History and Physical                            was performed, and patient medications and                            allergies were reviewed. The patient's tolerance of                            previous anesthesia was also reviewed. The risks                            and benefits of the procedure and the sedation                            options and risks were discussed with the patient.                            All questions were answered, and informed consent                            was obtained. Prior Anticoagulants: The patient has                            taken Xarelto (rivaroxaban), last dose was 2 days  prior to procedure. ASA Grade Assessment: III - A                            patient with severe systemic disease. After                            reviewing the risks and benefits, the patient was                            deemed in satisfactory condition to undergo the                            procedure.                           After obtaining informed consent, the colonoscope                            was passed under direct  vision. Throughout the                            procedure, the patient's blood pressure, pulse, and                            oxygen saturations were monitored continuously. The                            CF [id number] #[id number] was introduced through the anus                            and advanced to the the cecum, identified by                            appendiceal orifice and ileocecal valve. The                            colonoscopy was performed without difficulty. The                            patient tolerated the procedure well. The quality                            of the bowel preparation was good. The ileocecal                            valve, appendiceal orifice, and rectum were                            photographed. Scope In: 8:13:41 AM Scope Out: 8:31:18 AM Scope Withdrawal Time: 0 hours 14 minutes 21 seconds  Total Procedure Duration: 0 hours 17 minutes 37 seconds  Findings:                 The perianal and digital rectal examinations were  normal.                           Two sessile polyps were found in the sigmoid colon.                            The polyps were 3 to 4 mm in size. These polyps                            were removed with a cold snare. Resection and                            retrieval were complete. Estimated blood loss was                            minimal.                           Multiple large-mouthed and small-mouthed                            diverticula were found in the sigmoid colon,                            descending colon and ascending colon.                           The retroflexed view of the distal rectum and anal                            verge was normal and showed no anal or rectal                            abnormalities. Complications:            No immediate complications. Estimated Blood Loss:     Estimated blood loss was minimal. Impression:               - Two 3 to 4 mm polyps in the  sigmoid colon,                            removed with a cold snare. Resected and retrieved.                           - Diverticulosis in the sigmoid colon, in the                            descending colon and in the ascending colon.                           - The distal rectum and anal verge are normal on                            retroflexion view. Recommendation:           -  Patient has a contact number available for                            emergencies. The signs and symptoms of potential                            delayed complications were discussed with the                            patient. Return to normal activities tomorrow.                            Written discharge instructions were provided to the                            patient.                           - Resume previous diet.                           - Continue present medications.                           - Await pathology results.                           - Repeat colonoscopy for surveillance based on                            pathology results.                           - Return to GI office PRN.                           - Resume Xarelto (rivaroxaban) at prior dose                            tomorrow. Doristine Locks, MD 11/15/2022 8:36:17 AM

## 2022-11-16 ENCOUNTER — Telehealth: Payer: Self-pay

## 2022-11-16 NOTE — Telephone Encounter (Signed)
  Follow up Call-     11/15/2022    7:12 AM  Call back number  Post procedure Call Back phone  # [phone]  Permission to leave phone message Yes     Patient questions:  Do you have a fever, pain , or abdominal swelling? No. Pain Score  0 *  Have you tolerated food without any problems? Yes.    Have you been able to return to your normal activities? Yes.    Do you have any questions about your discharge instructions: Diet   No. Medications  No. Follow up visit  No  Do you have questions or concerns about your Care? Yes  I saw that patient had called and had a question as to when she was recommended to return for her next colonoscopy. I advised patient that Dr. Barron Alvine will communicate that to her once the pathology results for the polyps he removed are returned to him for review. Patient verbalizes understanding.  Actions: * If pain score is 4 or above: No action needed, pain <4.

## 2022-11-17 LAB — SURGICAL PATHOLOGY

## 2022-11-22 DIAGNOSIS — F411 Generalized anxiety disorder: Secondary | ICD-10-CM | POA: Diagnosis not present

## 2022-11-22 DIAGNOSIS — F3112 Bipolar disorder, current episode manic without psychotic features, moderate: Secondary | ICD-10-CM | POA: Diagnosis not present

## 2022-11-28 DIAGNOSIS — H25013 Cortical age-related cataract, bilateral: Secondary | ICD-10-CM | POA: Diagnosis not present

## 2022-11-28 DIAGNOSIS — H52223 Regular astigmatism, bilateral: Secondary | ICD-10-CM | POA: Diagnosis not present

## 2022-11-28 DIAGNOSIS — E119 Type 2 diabetes mellitus without complications: Secondary | ICD-10-CM | POA: Diagnosis not present

## 2022-11-28 DIAGNOSIS — H524 Presbyopia: Secondary | ICD-10-CM | POA: Diagnosis not present

## 2022-11-28 DIAGNOSIS — H5213 Myopia, bilateral: Secondary | ICD-10-CM | POA: Diagnosis not present

## 2022-11-28 LAB — HM DIABETES EYE EXAM

## 2022-11-30 DIAGNOSIS — K219 Gastro-esophageal reflux disease without esophagitis: Secondary | ICD-10-CM | POA: Diagnosis not present

## 2022-11-30 DIAGNOSIS — R490 Dysphonia: Secondary | ICD-10-CM | POA: Diagnosis not present

## 2022-12-11 ENCOUNTER — Encounter: Payer: Self-pay | Admitting: Cardiology

## 2022-12-11 ENCOUNTER — Telehealth: Payer: Self-pay | Admitting: *Deleted

## 2022-12-11 ENCOUNTER — Ambulatory Visit: Payer: Medicare PPO | Attending: Cardiology | Admitting: Cardiology

## 2022-12-11 VITALS — BP 102/68 | HR 88 | Ht 71.0 in | Wt 203.0 lb

## 2022-12-11 DIAGNOSIS — I10 Essential (primary) hypertension: Secondary | ICD-10-CM

## 2022-12-11 DIAGNOSIS — G4733 Obstructive sleep apnea (adult) (pediatric): Secondary | ICD-10-CM

## 2022-12-11 NOTE — Telephone Encounter (Signed)
Per Dr Mayford Knife, please order a new FFM of patient's choice ASAP - hers fell apart. Her DME is [facility] and they also need a sleep study report    Order placed to [facility] via comm msg.
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
## 2022-12-11 NOTE — Patient Instructions (Signed)
Medication Instructions:  Your physician recommends that you continue on your current medications as directed. Please refer to the Current Medication list given to you today.  *If you need a refill on your cardiac medications before your next appointment, please call your pharmacy*   Lab Work: None.  If you have labs (blood work) drawn today and your tests are completely normal, you will receive your results only by: MyChart Message (if you have MyChart) OR A paper copy in the mail If you have any lab test that is abnormal or we need to change your treatment, we will call you to review the results.   Testing/Procedures: None.   Follow-Up:  Your next appointment:   1 year(s)  Provider:   Dr. Armanda Magic, MD   Other Instructions Dr. Mayford Knife has ordered a new mask for you. Someone will call you regarding pick up or delivery.

## 2022-12-12 ENCOUNTER — Encounter (INDEPENDENT_AMBULATORY_CARE_PROVIDER_SITE_OTHER): Payer: Medicare PPO | Admitting: Family Medicine

## 2022-12-12 DIAGNOSIS — I4891 Unspecified atrial fibrillation: Secondary | ICD-10-CM

## 2022-12-13 DIAGNOSIS — I4891 Unspecified atrial fibrillation: Secondary | ICD-10-CM

## 2022-12-13 DIAGNOSIS — G4733 Obstructive sleep apnea (adult) (pediatric): Secondary | ICD-10-CM | POA: Diagnosis not present

## 2022-12-13 NOTE — Telephone Encounter (Signed)
[facility] and spoke with the assistant for [facility]- I had not received any operative clearance request from them per my knowledge.  They do have our correct fax number so I am not sure what happened- apologized that we did not respond sooner   The plan is to do a crown under local anesthetic, with epinephrine.  This is reasonable.  The dentist assistant asked me about if she needs to hold her Xarelto.  Advised that due to her health conditions I would prefer not to stop Xarelto, but if the dentist feels this is necessary we can arrange for it.  Certainly I defer to the person who is doing the surgery regarding necessity of holding her anticoagulation   She has atrial fibrillation s/p ablation x2, has stayed in sinus since her last procedure.  Seen by electrophysiology in August   I will ask DDS to minimize epi use due to history of atrial fibrillation   Faxed letter to DDS   Please see the MyChart message reply(ies) for my assessment and plan.  The patient gave consent for this Medical Advice Message and is aware that it may result in a bill to their insurance company as well as the possibility that this may result in a co-payment or deductible. They are an established patient, but are not seeking medical advice exclusively about a problem treated during an in person or video visit in the last 7 days. I did not recommend an in person or video visit within 7 days of my reply.  I spent a total of 15 minutes cumulative time within 7 days through [employer] Abbe Amsterdam, MD

## 2022-12-15 ENCOUNTER — Encounter: Payer: Self-pay | Admitting: Cardiology

## 2022-12-29 ENCOUNTER — Encounter: Payer: Self-pay | Admitting: Family Medicine

## 2023-01-10 ENCOUNTER — Ambulatory Visit (HOSPITAL_COMMUNITY)
Admission: RE | Admit: 2023-01-10 | Discharge: 2023-01-10 | Disposition: A | Discharge status: Home / Self Care | Payer: Medicare PPO | Source: Ambulatory Visit | Attending: Family Medicine | Admitting: Family Medicine

## 2023-01-10 ENCOUNTER — Encounter (HOSPITAL_COMMUNITY): Payer: Self-pay

## 2023-01-10 VITALS — BP 116/80 | HR 89 | Ht 70.0 in | Wt 199.8 lb

## 2023-01-10 DIAGNOSIS — G4733 Obstructive sleep apnea (adult) (pediatric): Secondary | ICD-10-CM

## 2023-01-10 DIAGNOSIS — Z7985 Long-term (current) use of injectable non-insulin antidiabetic drugs: Secondary | ICD-10-CM | POA: Insufficient documentation

## 2023-01-10 DIAGNOSIS — N183 Chronic kidney disease, stage 3 unspecified: Secondary | ICD-10-CM

## 2023-01-10 DIAGNOSIS — I471 Supraventricular tachycardia, unspecified: Secondary | ICD-10-CM | POA: Insufficient documentation

## 2023-01-10 DIAGNOSIS — Z7722 Contact with and (suspected) exposure to environmental tobacco smoke (acute) (chronic): Secondary | ICD-10-CM | POA: Insufficient documentation

## 2023-01-10 DIAGNOSIS — E663 Overweight: Secondary | ICD-10-CM | POA: Diagnosis not present

## 2023-01-10 DIAGNOSIS — I10 Essential (primary) hypertension: Secondary | ICD-10-CM

## 2023-01-10 DIAGNOSIS — Z6828 Body mass index (BMI) 28.0-28.9, adult: Secondary | ICD-10-CM | POA: Diagnosis not present

## 2023-01-10 DIAGNOSIS — I252 Old myocardial infarction: Secondary | ICD-10-CM | POA: Diagnosis not present

## 2023-01-10 DIAGNOSIS — I2510 Atherosclerotic heart disease of native coronary artery without angina pectoris: Secondary | ICD-10-CM | POA: Diagnosis not present

## 2023-01-10 DIAGNOSIS — Z7901 Long term (current) use of anticoagulants: Secondary | ICD-10-CM | POA: Insufficient documentation

## 2023-01-10 DIAGNOSIS — N1832 Chronic kidney disease, stage 3b: Secondary | ICD-10-CM | POA: Diagnosis not present

## 2023-01-10 DIAGNOSIS — Z79899 Other long term (current) drug therapy: Secondary | ICD-10-CM | POA: Insufficient documentation

## 2023-01-10 DIAGNOSIS — I130 Hypertensive heart and chronic kidney disease with heart failure and stage 1 through stage 4 chronic kidney disease, or unspecified chronic kidney disease: Secondary | ICD-10-CM | POA: Diagnosis not present

## 2023-01-10 DIAGNOSIS — Z794 Long term (current) use of insulin: Secondary | ICD-10-CM | POA: Diagnosis not present

## 2023-01-10 DIAGNOSIS — Z7984 Long term (current) use of oral hypoglycemic drugs: Secondary | ICD-10-CM | POA: Insufficient documentation

## 2023-01-10 DIAGNOSIS — I480 Paroxysmal atrial fibrillation: Secondary | ICD-10-CM | POA: Diagnosis not present

## 2023-01-10 DIAGNOSIS — I5032 Chronic diastolic (congestive) heart failure: Secondary | ICD-10-CM

## 2023-01-10 DIAGNOSIS — E1122 Type 2 diabetes mellitus with diabetic chronic kidney disease: Secondary | ICD-10-CM

## 2023-01-10 NOTE — Progress Notes (Signed)
ADVANCED HF CLINIC NOTE  Date:  01/10/2023   ID:  Kathleen [name], DOB November 12, 1955, MRN [id number]  Location: Home  Provider location: [name] Type of Visit: Established patient  PCP:  [name], Kathleen Found, MD  HF Cardiologist:  Kathleen Meres, MD  Chief Complaint: follow up heart failure    HPI: 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 Sleep study 11/2013 was negative.     NSTEMI in 2/09 -> cath with occlusion of septal perforator. Otherwise normal arteries.  Admitted 8/16 for Afib RVR. She converted to sinus tach with dilt drip and was transitioned to Toprol-XL 25 mg daily.   LHC 12/17 with widely patent coronary arteries. LVEF 55%. Mildly elevated LVEDP.   Admitted 2/21 for atrial fibrillation w/ RVR in the setting of CAP and also w/ a/c diastolic HF. Echo EF 60-65%. Hospital course also notable for AKI. SCr rose to 2.09 during admit but improved down to 1.46 on d/c. D/c wt 330 lb.  Zio patch 3/22 - 3% AF burden.  Echo 1/23 showed EF 55-60%, grade I DD, mildly reduced RV, mild to moderate TR, mild AI.  Zio 5/23 - AF burden 15%. 3 runs SVT  Underwent AF ablation 12/06/21  [name] sleep study (6/23) with AHI 12.5/hr.  Follow up 12/23, doing well NYHA II. Entresto stopped with low BP. Seen in ED 01/30/22 with AFL RVR, underwent DCCV to NSR. Fllow up with EP (1/24), started on amiodarone 200 daily but later stopped due to fatigue.  S/p TEE & AF ablation (5/24) EF 55-60%, normal RV, no LAA thombus or PFO  Today she returns for HF follow up. Overall feeling fine. She works out at [name] 6 days/week, down > 90 lbs. No SOb with activity. Denies palpitations, abnormal bleeding, CP, dizziness, edema, or PND/Orthopnea. Appetite ok. No fever or chills. Weight at home 200 pounds. Taking all medications, occasionally takes extra 40 mg Lasix. Wearing  CPAP.  Past Medical History:  Diagnosis Date   Anemia    Anxiety    Asthma    Atrial fibrillation (HCC)    Bipolar disorder (HCC)    CAD (coronary artery disease)    Cancer (HCC)    basal cell carcinoma on right hand, papilloma left breast   CHF (congestive heart failure) (HCC)    pt seen at heart/vascular spec clinic 08/14/2014    Chronic kidney disease    Coarse tremors    hands   Colon polyps    Coronary artery disease    Depression    Depression    Diabetes (HCC)    DVT (deep vein thrombosis) in pregnancy    pt. 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HCC) 02/2007   non-q-wave with second septal perforator   Hyperlipidemia    on meds   Hypertension    on meds   Hypothyroidism    history of took medication, has resolved   Knee pain, left    Lower extremity deep venous thrombosis (HCC)    20 years ago    Measles    hx of in childhood    Morbid obesity with BMI of 40.0-44.9, adult (HCC)  Osteopenia    Pain at surgical incision    Left breast from procedure 09/11/16   Pneumonia    hx of    PONV (postoperative nausea and vomiting)    also slow to wake up   Psychiatric hospitalization 05/2008   Shingles    20 years ago    Shortness of breath dyspnea    exercise    Sleep apnea    uses CPAP   Type 2 diabetes mellitus (HCC) 12/31/2015   Urinary incontinence    UTI (urinary tract infection)    Past Surgical History:  Procedure Laterality Date   ATRIAL FIBRILLATION ABLATION N/A 12/06/2021   Procedure: ATRIAL FIBRILLATION ABLATION;  Surgeon: Kathleen Lemming, MD;  Location: [facility] INVASIVE CV LAB;  Service: Cardiovascular;  Laterality: N/A;   ATRIAL FIBRILLATION ABLATION N/A 06/13/2022   Procedure: ATRIAL FIBRILLATION ABLATION;  Surgeon: Kathleen Lemming, MD;  Location: [facility] INVAS[facility] CV LAB;  Service: Cardiovascular;  Laterality: N/A;    BASAL CELL CARCINOMA EXCISION     BREAST EXCISIONAL BIOPSY Left 2018   BREAST LUMPECTOMY WITH RADIOACTIVE SEED LOCALIZATION Left 09/11/2016   Procedure: LEFT BREAST LUMPECTOMY WITH RADIOACTIVE SEED LOCALIZATION;  Surgeon: Kathleen Fellows, MD;  Location: [facility] OR;  Service: General;  Laterality: Left;   BREAST MASS EXCISION     age 89, benign tumor   CARDIAC CATHETERIZATION     CARDIAC CATHETERIZATION N/A 12/30/2015   Procedure: Left Heart Cath and Coronary Angiography;  Surgeon: Kathleen Records, MD;  Location: [facility] INVASIVE CV LAB;  Service: Cardiovascular;  Laterality: N/A;   COLONOSCOPY  07/15/2009   [facility]   DILATATION & CURETTAGE/HYSTEROSCOPY WITH MYOSURE N/A 09/22/2016   Procedure: DILATATION & CURETTAGE/HYSTEROSCOPY WITH MYOSURE;  Surgeon: Kathleen Overlie, MD;  Location: [facility] ORS;  Service: Gynecology;  Laterality: N/A;   DILATION AND CURETTAGE OF UTERUS     KNEE SURGERY     right, removed cartilage   PARATHYROIDECTOMY N/A 08/25/2014   Procedure: PARATHYROIDECTOMY;  Surgeon: Kathleen Level, MD;  Location: [facility] ORS;  Service: General;  Laterality: N/A;   SHOULDER ARTHROSCOPY WITH ROTATOR CUFF REPAIR AND SUBACROMIAL DECOMPRESSION Right 02/27/2017   Procedure: Right shoulder arthroscopic rotator cuff repair with biceps tenodesis and subacromial decompression;  Surgeon: Kathleen Kida, MD;  Location: [facility] OR;  Service: Orthopedics;  Laterality: Right;  150 mins   TEE WITHOUT CARDIOVERSION N/A 06/13/2022   Procedure: TRANSESOPHAGEAL ECHOCARDIOGRAM;  Surgeon: Kathleen Patty, MD;  Location: [facility] INVASIVE CV LAB;  Service: Cardiovascular;  Laterality: N/A;   TONSILLECTOMY     TUBAL LIGATION     Current Outpatient Medications  Medication Sig Dispense Refill   ACCU-CHEK GUIDE test strip USE AS INSTRUCTED 300 strip 3   Accu-Chek Softclix Lancets lancets USE AS DIRECTED 300 each 3   allopurinol (ZYLOPRIM) 100 MG tablet Take 1 tablet (100 mg total) by mouth 2 (two) times daily. 180 tablet 1    benztropine (COGENTIN) 1 MG tablet Take 1 mg by mouth 2 (two) times daily.  2   Blood Glucose Calibration (ACCU-CHEK GUIDE CONTROL) LIQD 1 drop by In Vitro route as directed. E11.9 1 each 1   Blood Glucose Monitoring Suppl (ACCU-CHEK GUIDE ME) w/Device KIT 1 each by Does not apply route in the morning and at bedtime. E11.9 1 kit 0   calcitRIOL (ROCALTROL) 0.25 MCG capsule Take 1 capsule (0.25 mcg total) by mouth daily. 90 capsule 1   Cholecalciferol (VITAMIN D3) 50 MCG (2000 UT) capsule Take 2,000 Units by mouth daily.  divalproex (DEPAKOTE ER) 500 MG 24 hr tablet Take 1,000 mg by mouth at bedtime.     famotidine (PEPCID) 20 MG tablet Take 20 mg by mouth daily.     furosemide (LASIX) 80 MG tablet Take 80 mg by mouth daily. Take 1 tablet daily     JARDIANCE 10 MG TABS tablet TAKE ONE TABLET BY MOUTH DAILY 90 tablet 3   LATUDA 60 MG TABS Take 60 mg by mouth at bedtime.      levothyroxine (SYNTHROID) 50 MCG tablet TAKE 1 TABLET BY MOUTH DAILY BEFORE BREAKFAST 90 tablet 1   metoprolol tartrate (LOPRESSOR) 25 MG tablet Take 25 mg by mouth 2 (two) times daily.     nitroGLYCERIN (NITROSTAT) 0.4 MG SL tablet Place 1 tablet (0.4 mg total) under the tongue every 5 (five) minutes as needed for chest pain. For chest pain 25 tablet 3   NON FORMULARY Place 1 drop into both eyes 2 (two) times daily. Regener-Eyes eye drops     OLANZapine (ZYPREXA) 15 MG tablet Take 15 mg by mouth at bedtime.     rosuvastatin (CRESTOR) 20 MG tablet TAKE 1 TABLET BY MOUTH DAILY 90 tablet 1   tirzepatide (MOUNJARO) 12.5 MG/0.5ML Pen Inject 12.5 mg into the skin once a week. 6 mL 1   XARELTO 20 MG TABS tablet TAKE 1 TABLET BY MOUTH AT BEDTIME 90 tablet 3   No current facility-administered medications for this encounter.    Allergies:   Influenza vaccines, Tetanus toxoids, Tetanus-diphtheria toxoids td, and Lamictal [lamotrigine]   Social History:  The patient  reports that she has never smoked. She has been exposed to tobacco  smoke. She has never used smokeless tobacco. She reports that she does not currently use alcohol. She reports that she does not use drugs.   Family History:  The patient's family history includes Alcohol abuse in her father, maternal grandfather, maternal grandmother, mother, paternal grandfather, and paternal grandmother; Alzheimer's disease in her father and mother; Breast cancer (age of onset: 2) in her mother; Diabetes in her brother; Drug abuse in her maternal grandfather; Hypertension in her brother; Lung cancer in her mother; Parkinson's disease in her father; Schizophrenia in her cousin.   ROS:  Please see the history of present illness.   All other systems are personally reviewed and negative.   Recent Labs: 01/30/2022: B Natriuretic Peptide 54.6; Magnesium 2.2 10/02/2022: TSH 1.18 11/07/2022: ALT 22; BUN 30; Creatinine 1.82; Hemoglobin 16.2; Platelet Count 114; Potassium 4.3; Sodium 143  Personally reviewed   BP 116/80   Pulse 89   Ht 5\' 10"  (1.778 m)   Wt 90.6 kg (199 lb 12.8 oz)   SpO2 99%   BMI 28.67 kg/m   Wt Readings from Last 3 Encounters:  01/10/23 90.6 kg (199 lb 12.8 oz)  12/11/22 92.1 kg (203 lb)  11/15/22 93.9 kg (207 lb)   Physical Exam General:  NAD. No resp difficulty, walked into clinic HEENT: Normal Neck: Supple. No JVD. Carotids 2+ bilat; no bruits. No lymphadenopathy or thryomegaly appreciated. Cor: PMI nondisplaced. Regular rate & rhythm. No rubs, gallops or murmurs. Lungs: Clear Abdomen: Soft, nontender, nondistended. No hepatosplenomegaly. No bruits or masses. Good bowel sounds. Extremities: No cyanosis, clubbing, rash, edema Neuro: Alert & oriented x 3, cranial nerves grossly intact. Moves all 4 extremities w/o difficulty. Affect pleasant.  ECG (personally reviewed): NSR 89 bpm  ASSESSMENT AND PLAN: 1. Chronic diastolic HF:  - Echo (7/17): EF 55-60% Grade IDD  - Echo (7/19):  EF 55-60%  - Echo (2/21): EF 60-65%, RV ok  - Echo (1/23): EF 60-65%, RV  ok. - TEE (5/24): EF 55-60%, RV ok - Stable NYHA I. Volume looks good today. Doing well with sliding scale lasix - Off Entresto with low BP - Continue Lasix 80 mg daily, can take extra 40 mg PRN  - Continue Jardiance 10 mg daily. No GU symptoms  - Continue metoprolol tartrate 25 mg bid. - Recent labs reviewed and are stable, K 4.3, SCr 1.82  2. PAF - Zio patch 3/22 - 3% AF burden - Repeat Zio 5/23 - AF burden 15% (average rate 128 in AF. 81 when in sinus). 3 runs SVT - Underwent AF ablation 12/06/21 -> in NSR - s/p AF ablation (5/24) - CHADSVASC = 6 - NSR on ECG today. - Continue metoprolol - Continue Xarelto 20 mg daily. No bleeding - Last hgb 16.2  3. CAD - No s/s angina - No ASA w/ Xarelto.  - Continue statin.     4. HTN   - BP stable - GDMT as above  5. DM2 - HgbA1c 5.4 (9/24) - Continue Jardiance - Continue GLP1  6. CKD 3b - Follows with Dr. Allena Katz. - Continue SGLT2i. - Most recent SCr 1.82  7. Overweight - Body mass index is 28.67 kg/m. - Continue with exercises at [facility] - Has lost 65 lbs with Mounjaro, overall down >90 lbs!   8. OSA - No OSA on previous sleep study in 2015 - PSG (2024) showed mild OSA with AHI 12.5 - Now on CPAP  Doing well! Follow up in 3-4 months with Dr. Mickle Plumb, FNP  01/10/2023 10:28 AM  [facility] [address] [facility] [place] [address] [phone] (office) [phone] (fax)

## 2023-01-10 NOTE — Patient Instructions (Signed)
Thank you for coming in today  If you had labs drawn today, any labs that are abnormal the clinic will call you No news is good news  Medications: No changes  Follow up appointments:  Your physician recommends that you schedule a follow-up appointment in:  3 months With Dr. Gala Romney 04/19/2023 at 2pm   Do the following things EVERYDAY: Weigh yourself in the morning before breakfast. Write it down and keep it in a log. Take your medicines as prescribed Eat low salt foods--Limit salt (sodium) to 2000 mg per day.  Stay as active as you can everyday Limit all fluids for the day to less than 2 liters   At the [facility], you and your health needs are our priority. As part of our continuing mission to provide you with exceptional heart care, we have created designated Provider Care Teams. These Care Teams include your primary Cardiologist (physician) and Advanced Practice Providers (APPs- Physician Assistants and Nurse Practitioners) who all work together to provide you with the care you need, when you need it.   You may see any of the following providers on your designated Care Team at your next follow up: Dr Arvilla Meres Dr Marca Ancona Dr. Marcos Eke, NP Robbie Lis, [place] [facility] [place], [place] Brynda Peon, NP Karle Plumber, PharmD   Please be sure to bring in all your medications bottles to every appointment.    Thank you for choosing [facility]  If you have any questions or concerns before your next appointment please send [place] a message through [place] or call our office at [phone].    TO LEAVE A MESSAGE FOR THE NURSE SELECT OPTION 2, PLEASE LEAVE A MESSAGE INCLUDING: YOUR NAME DATE OF BIRTH CALL BACK NUMBER REASON FOR CALL**this is important as we prioritize the call backs  YOU WILL RECEIVE A CALL BACK THE SAME DAY AS LONG AS YOU CALL BEFORE 4:00 PM

## 2023-01-22 ENCOUNTER — Other Ambulatory Visit (HOSPITAL_COMMUNITY): Payer: Self-pay | Admitting: Internal Medicine

## 2023-01-22 ENCOUNTER — Encounter: Payer: Self-pay | Admitting: Family Medicine

## 2023-01-22 ENCOUNTER — Other Ambulatory Visit: Payer: Self-pay | Admitting: Family Medicine

## 2023-01-22 DIAGNOSIS — E119 Type 2 diabetes mellitus without complications: Secondary | ICD-10-CM

## 2023-01-22 DIAGNOSIS — E039 Hypothyroidism, unspecified: Secondary | ICD-10-CM

## 2023-01-24 ENCOUNTER — Telehealth: Payer: Medicare PPO | Admitting: Family Medicine

## 2023-01-24 DIAGNOSIS — R3989 Other symptoms and signs involving the genitourinary system: Secondary | ICD-10-CM

## 2023-01-24 MED ORDER — CEPHALEXIN 500 MG PO CAPS: 500.0000 mg | ORAL_CAPSULE | Freq: Two times a day (BID) | ORAL | 0 refills | Status: DC

## 2023-01-24 NOTE — Progress Notes (Signed)
 E-Visit for Urinary Problems  We are sorry that you are not feeling well.  Here is how we plan to help!  Based on what you shared with me it looks like you most likely have a simple urinary tract infection.  A UTI (Urinary Tract Infection) is a bacterial infection of the bladder.  Most cases of urinary tract infections are simple to treat but a key part of your care is to encourage you to drink plenty of fluids and watch your symptoms carefully.  I have prescribed Keflex  500 mg twice a day for 7 days.  Your symptoms should gradually improve. Call us  if the burning in your urine worsens, you develop worsening fever, back pain or pelvic pain or if your symptoms do not resolve after completing the antibiotic.  Urinary tract infections can be prevented by drinking plenty of water to keep your body hydrated.  Also be sure when you wipe, wipe from front to back and don't hold it in!  If possible, empty your bladder every 4 hours.  HOME CARE Drink plenty of fluids Compete the full course of the antibiotics even if the symptoms resolve Remember, when you need to go.go. Holding in your urine can increase the likelihood of getting a UTI! GET HELP RIGHT AWAY IF: You cannot urinate You get a high fever Worsening back pain occurs You see blood in your urine You feel sick to your stomach or throw up You feel like you are going to pass out  MAKE SURE YOU  Understand these instructions. Will watch your condition. Will get help right away if you are not doing well or get worse.   Thank you for choosing an e-visit.  Your e-visit answers were reviewed by a board certified advanced clinical practitioner to complete your personal care plan. Depending upon the condition, your plan could have included both over the counter or prescription medications.  Please review your pharmacy choice. Make sure the pharmacy is open so you can pick up prescription now. If there is a problem, you may contact your  provider through [employer] and have the prescription routed to another pharmacy.  Your safety is important to us . If you have drug allergies check your prescription carefully.   For the next 24 hours you can use MyChart to ask questions about today's visit, request a non-urgent call back, or ask for a work or school excuse. You will get an email in the next two days asking about your experience. I hope that your e-visit has been valuable and will speed your recovery.  I have spent 5 minutes in review of e-visit questionnaire, review and updating patient chart, medical decision making and response to patient.   Delon CHRISTELLA Dickinson, PA-C

## 2023-01-26 ENCOUNTER — Telehealth: Payer: Self-pay

## 2023-01-26 NOTE — Telephone Encounter (Signed)
 [name] (Key: [id number])  Approved on January 2 by [facility] NCPDP 2017  PA Case: [id number], Status: Approved, Coverage Starts on: 01/24/2023 12:00:00 AM, Coverage Ends on: 01/23/2024 12:00:00 AM. Questions? Contact [phone].

## 2023-02-07 DIAGNOSIS — N1832 Chronic kidney disease, stage 3b: Secondary | ICD-10-CM | POA: Diagnosis not present

## 2023-02-19 DIAGNOSIS — L814 Other melanin hyperpigmentation: Secondary | ICD-10-CM | POA: Diagnosis not present

## 2023-02-19 DIAGNOSIS — L821 Other seborrheic keratosis: Secondary | ICD-10-CM | POA: Diagnosis not present

## 2023-02-19 DIAGNOSIS — D225 Melanocytic nevi of trunk: Secondary | ICD-10-CM | POA: Diagnosis not present

## 2023-02-19 DIAGNOSIS — D220 Melanocytic nevi of lip: Secondary | ICD-10-CM | POA: Diagnosis not present

## 2023-02-19 DIAGNOSIS — D2272 Melanocytic nevi of left lower limb, including hip: Secondary | ICD-10-CM | POA: Diagnosis not present

## 2023-02-19 DIAGNOSIS — L853 Xerosis cutis: Secondary | ICD-10-CM | POA: Diagnosis not present

## 2023-02-19 DIAGNOSIS — C44712 Basal cell carcinoma of skin of right lower limb, including hip: Secondary | ICD-10-CM | POA: Diagnosis not present

## 2023-02-19 DIAGNOSIS — L817 Pigmented purpuric dermatosis: Secondary | ICD-10-CM | POA: Diagnosis not present

## 2023-02-19 DIAGNOSIS — L570 Actinic keratosis: Secondary | ICD-10-CM | POA: Diagnosis not present

## 2023-02-20 DIAGNOSIS — E1122 Type 2 diabetes mellitus with diabetic chronic kidney disease: Secondary | ICD-10-CM | POA: Diagnosis not present

## 2023-02-20 DIAGNOSIS — N189 Chronic kidney disease, unspecified: Secondary | ICD-10-CM | POA: Diagnosis not present

## 2023-02-20 DIAGNOSIS — I129 Hypertensive chronic kidney disease with stage 1 through stage 4 chronic kidney disease, or unspecified chronic kidney disease: Secondary | ICD-10-CM | POA: Diagnosis not present

## 2023-02-20 DIAGNOSIS — E210 Primary hyperparathyroidism: Secondary | ICD-10-CM | POA: Diagnosis not present

## 2023-02-20 DIAGNOSIS — N1832 Chronic kidney disease, stage 3b: Secondary | ICD-10-CM | POA: Diagnosis not present

## 2023-02-20 DIAGNOSIS — D631 Anemia in chronic kidney disease: Secondary | ICD-10-CM | POA: Diagnosis not present

## 2023-02-21 DIAGNOSIS — F411 Generalized anxiety disorder: Secondary | ICD-10-CM | POA: Diagnosis not present

## 2023-02-21 DIAGNOSIS — F3112 Bipolar disorder, current episode manic without psychotic features, moderate: Secondary | ICD-10-CM | POA: Diagnosis not present

## 2023-02-22 LAB — VITAMIN D 25 HYDROXY (VIT D DEFICIENCY, FRACTURES): Vit D, 25-Hydroxy: 75.8

## 2023-02-26 ENCOUNTER — Encounter: Payer: Self-pay | Admitting: Family Medicine

## 2023-02-26 DIAGNOSIS — C4490 Unspecified malignant neoplasm of skin, unspecified: Secondary | ICD-10-CM | POA: Insufficient documentation

## 2023-02-27 ENCOUNTER — Telehealth: Payer: Medicare PPO | Admitting: Family Medicine

## 2023-02-27 DIAGNOSIS — J029 Acute pharyngitis, unspecified: Secondary | ICD-10-CM

## 2023-02-28 DIAGNOSIS — J0100 Acute maxillary sinusitis, unspecified: Secondary | ICD-10-CM | POA: Diagnosis not present

## 2023-02-28 DIAGNOSIS — R509 Fever, unspecified: Secondary | ICD-10-CM | POA: Diagnosis not present

## 2023-02-28 DIAGNOSIS — Z20822 Contact with and (suspected) exposure to covid-19: Secondary | ICD-10-CM | POA: Diagnosis not present

## 2023-02-28 NOTE — Progress Notes (Signed)
  Thank you for the details you included in the comment boxes. Those details are very helpful in determining the best course of treatment for you and help us  to provide the best care.Because of the length of time of symptoms and your other medical conditions, we recommend that you schedule a Virtual Urgent Care video visit in order for the provider to better assess what is going on.  The provider will be able to give you a more accurate diagnosis and treatment plan if we can more freely discuss your symptoms and with the addition of a virtual examination.   If you change your visit to a video visit, we will bill your insurance (similar to an office visit) and you will not be charged for this e-Visit. You will be able to stay at home and speak with the first available [facility] advanced practice provider. The link to do a video visit is in the drop down Menu tab of your Welcome screen in MyChart.

## 2023-03-08 DIAGNOSIS — Z01419 Encounter for gynecological examination (general) (routine) without abnormal findings: Secondary | ICD-10-CM | POA: Diagnosis not present

## 2023-03-08 DIAGNOSIS — Z6827 Body mass index (BMI) 27.0-27.9, adult: Secondary | ICD-10-CM | POA: Diagnosis not present

## 2023-03-15 ENCOUNTER — Ambulatory Visit: Payer: Medicare PPO | Admitting: Physician Assistant

## 2023-03-15 DIAGNOSIS — G4733 Obstructive sleep apnea (adult) (pediatric): Secondary | ICD-10-CM | POA: Diagnosis not present

## 2023-03-16 ENCOUNTER — Ambulatory Visit: Payer: Medicare PPO | Admitting: Physician Assistant

## 2023-03-18 NOTE — Patient Instructions (Addendum)
 Great to see you today!  I will be in touch with your labs Keep up the good work

## 2023-03-18 NOTE — Progress Notes (Signed)
 [facility] [address] [place], [place] [address] [phone]  Date:  03/26/2023   Name:  Kathleen [name]   DOB:  [birth year]   MRN:  [id number]  PCP:  Pearline Cables, MD    Chief Complaint: 6 month follow up (Concerns/ questions: none)   History of Present Illness:  Kathleen [name] is a 68 y.o. very pleasant female patient who presents with the following:  Pt seen today for 6 month recheck Last seen by myself in September   History of  morbid obesity, chronic diastolic HF, DM2, hypothyroidism, hyperparathyroidism status post parathyroidectomy, HTN, HL and CAD with NSTEMI 2009, CRI. Sleep study 11/2013 was negative. Also has history of A fib, bipolar disorder managed by Dr Yetta Barre.  Stage III chronic kidney disease, patient at [facility].  She had an atrial fibrillation ablation in November 2023 but unfortunately has since had episodes of flutter She is on Xarelto due to atrial fib/flutter She had a repeat ablation per Dr Jorge Ny 05/2022  Seen by nephrology on 2/21-they checked a vitamin D and PTH level.  Will abstract  Visit with Dr Mayford Knife in November: OSA - The patient is tolerating PAP therapy well without any problems. The PAP download performed by his DME was personally reviewed and interpreted by me today and showed an AHI of 2 /hr on auto CPAP 4-15 cm H2O with 33% compliance in using more than 4 hours nightly.  The patient has been using and benefiting from PAP use and will continue to benefit from therapy.  -her compliance is down because she has not had a functional mask for a few weeks and has been getting the run around with the DME to get a new mask -I will call her DME to order a new mask ASAP -I encouraged her to try to increase humidity \\in  her device and add a humidifier in the room so she does not run out of water in her chamber Hypertension -BP is adequate controlled on exam today -Continue  prescription drug management with Lopressor 25 mg twice daily with as needed refills  Seen by Dr Elberta Fortis last August: 1.  Paroxysmal atrial fibrillation/flutter: Status post ablation 12/06/2021 with repeat ablation 06/13/2022.  Currently on Xarelto.  She remains in sinus rhythm.  She has had no further episodes of atrial fibrillation or atrial flutter since her ablation. 2.  Secondary hypercoagulable: Currently on Xarelto for atrial fibrillation 3.  Obstructive sleep apnea: CPAP compliance encouraged 4.  Obesity: Lifestyle modification encouraged 5.  Chronic diastolic heart failure: Appears euvolemic.  Plan per heart failure cardiology   Lab Results  Component Value Date   HGBA1C 5.4 10/02/2022   Mammo is scheduled for next month Update A1c today Dexa UTD   She is not aware of any A fib- she is seeing Camnitz next week She goes to the gym every day, she does both cardio and strength training No chest pain or shortness of breath with activity Patient Active Problem List   Diagnosis Date Noted   Skin cancer 02/26/2023   Diabetes mellitus without complication (HCC) 07/12/2021   Hypercoagulable state due to paroxysmal atrial fibrillation (HCC) 06/30/2021   At increased risk of breast cancer 03/01/2021   Osteopenia 12/02/2019   AKI (acute kidney injury) (HCC)    CAP (community acquired pneumonia) 03/01/2019   Atrial fibrillation with RVR (HCC) 02/28/2019   Chronic kidney disease (CKD), stage III (moderate) (HCC) 01/14/2019  Vitamin D deficiency 07/22/2018   Complete rotator cuff tear or rupture of right shoulder, not specified as traumatic 02/27/2017   Auditory hallucination 12/13/2016   Gout 06/29/2016   Type 2 diabetes mellitus (HCC) 12/31/2015   Coronary artery dissection    Lung nodule seen on imaging study 09/24/2014   Paroxysmal atrial fibrillation (HCC) 09/11/2014   Asthmatic bronchitis with exacerbation 05/25/2014   Primary hyperparathyroidism (HCC) 04/15/2014   Chronic  diastolic heart failure (HCC) 10/02/2013   Shortness of breath 08/31/2013   Venous stasis dermatitis of both lower extremities 08/19/2013   Nocturia 06/12/2013   Morbid obesity (HCC) 08/09/2012   Urinary incontinence, urge 07/11/2012   CAD (coronary artery disease) 10/31/2010   Obstructive sleep apnea 10/31/2010   NEOPLASM OF UNCERTAIN BEHAVIOR OF SKIN 02/11/2010   Bipolar disorder (HCC) 09/14/2009   Hyperlipidemia 05/23/2008   Disorder of thyroid 02/04/2008   HYPERTENSION, BENIGN ESSENTIAL 02/04/2008   MYOCARDIAL INFARCTION, HX OF 03/05/2007    Past Medical History:  Diagnosis Date   Anemia    Anxiety    Asthma    Atrial fibrillation (HCC)    Bipolar disorder (HCC)    CAD (coronary artery disease)    Cancer (HCC)    basal cell carcinoma on right hand, papilloma left breast   CHF (congestive heart failure) (HCC)    pt seen at heart/vascular spec clinic 08/14/2014    Chronic kidney disease    Coarse tremors    hands   Colon polyps    Coronary artery disease    Depression    Depression    Diabetes (HCC)    DVT (deep vein thrombosis) in pregnancy    pt. 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HCC) 02/2007   non-q-wave with second septal perforator   Hyperlipidemia    on meds   Hypertension    on meds   Hypothyroidism    history of took medication, has resolved   Knee pain, left    Lower extremity deep venous thrombosis (HCC)    20 years ago    Measles    hx of in childhood    Morbid obesity with BMI of 40.0-44.9, adult (HCC)    Osteopenia    Pain at surgical incision    Left breast from procedure 09/11/16   Pneumonia    hx of    PONV (postoperative nausea and vomiting)    also slow to wake up   Psychiatric hospitalization 05/2008   Shingles    20 years ago    Shortness of breath dyspnea    exercise    Sleep apnea    uses CPAP    Type 2 diabetes mellitus (HCC) 12/31/2015   Urinary incontinence    UTI (urinary tract infection)     Past Surgical History:  Procedure Laterality Date   ATRIAL FIBRILLATION ABLATION N/A 12/06/2021   Procedure: ATRIAL FIBRILLATION ABLATION;  Surgeon: Regan Lemming, MD;  Location: [facility] INVASIVE CV LAB;  Service: Cardiovascular;  Laterality: N/A;   ATRIAL FIBRILLATION ABLATION N/A 06/13/2022   Procedure: ATRIAL FIBRILLATION ABLATION;  Surgeon: Regan Lemming, MD;  Location: [facility] INVASIVE CV LAB;  Service: Cardiovascular;  Laterality: N/A;   BASAL CELL CARCINOMA EXCISION     BREAST EXCISIONAL BIOPSY Left 2018   BREAST LUMPECTOMY WITH RADIOACTIVE SEED LOCALIZATION Left  09/11/2016   Procedure: LEFT BREAST LUMPECTOMY WITH RADIOACTIVE SEED LOCALIZATION;  Surgeon: Glenna Fellows, MD;  Location: [facility] OR;  Service: General;  Laterality: Left;   BREAST MASS EXCISION     age 36, benign tumor   CARDIAC CATHETERIZATION     CARDIAC CATHETERIZATION N/A 12/30/2015   Procedure: Left Heart Cath and Coronary Angiography;  Surgeon: Lyn Records, MD;  Location[facility] CV LAB;  Service: Cardiovascular;  Laterality: N/A;   COLONOSCOPY  07/15/2009   [facility]   DILATATION & CURETTAGE/HYSTEROSCOPY WITH MYOSURE N/A 09/22/2016   Procedure: DILATATION & CURETTAGE/HYSTEROSCOPY WITH MYOSURE;  Surgeon: Richarda Overlie, MD;  Location: [facility] ORS;  Service: Gynecology;  Laterality: N/A;   DILATION AND CURETTAGE OF UTERUS     KNEE SURGERY     right, removed cartilage   PARATHYROIDECTOMY N/A 08/25/2014   Procedure: PARATHYROIDECTOMY;  Surgeon: Darnell Level, MD;  Location: [facility] ORS;  Service: General;  Laterality: N/A;   SHOULDER ARTHROSCOPY WITH ROTATOR CUFF REPAIR AND SUBACROMIAL DECOMPRESSION Right 02/27/2017   Procedure: Right shoulder arthroscopic rotator cuff repair with biceps tenodesis and subacromial decompression;  Surgeon: Yolonda Kida, MD;  Location: [facility] OR;  Service: Orthopedics;  Laterality:  Right;  150 mins   TEE WITHOUT CARDIOVERSION N/A 06/13/2022   Procedure: TRANSESOPHAGEAL ECHOCARDIOGRAM;  Surgeon: Dolores Patty, MD;  Location: [facility] INVASIVE CV LAB;  Service: Cardiovascular;  Laterality: N/A;   TONSILLECTOMY     TUBAL LIGATION      Social History   Tobacco Use   Smoking status: Never    Passive exposure: Past   Smokeless tobacco: Never   Tobacco comments:    Never smoker 01/03/22  Vaping Use   Vaping status: Never Used  Substance Use Topics   Alcohol use: Not Currently    Comment: occ.   Drug use: No    Family History  Problem Relation Age of Onset   Breast cancer Mother 96   Alcohol abuse Mother    Alzheimer's disease Mother    Lung cancer Mother    Alcohol abuse Father    Alzheimer's disease Father    Parkinson's disease Father    Diabetes Brother    Hypertension Brother    Alcohol abuse Maternal Grandmother    Alcohol abuse Maternal Grandfather    Drug abuse Maternal Grandfather    Alcohol abuse Paternal Grandmother    Alcohol abuse Paternal Grandfather    Schizophrenia Cousin    Colon cancer Neg Hx    Esophageal cancer Neg Hx    Rectal cancer Neg Hx    Stomach cancer Neg Hx    Colon polyps Neg Hx     Allergies  Allergen Reactions   Influenza Vaccines Hives    Can eat foods with cooked eggs; CANNOT handle when part of flu vaccine, etc.    Tetanus Toxoids Itching and Swelling    Vigorous local reaction to tdap with local pain   Tetanus-Diphtheria Toxoids Td Itching and Swelling    Other reaction(s): Myalgias (intolerance) Vigorous Local Soft Tissue Reaction to TDAP   Lamictal [Lamotrigine] Hives and Rash    Medication list has been reviewed and updated.  Current Outpatient Medications on File Prior to Visit  Medication Sig Dispense Refill   ACCU-CHEK GUIDE test strip USE AS INSTRUCTED 300 strip 3   Accu-Chek Softclix Lancets lancets USE AS DIRECTED 300 each 3   allopurinol (ZYLOPRIM) 100 MG tablet Take 1 tablet (100 mg total) by  mouth 2 (two) times daily. 180 tablet  1   benztropine (COGENTIN) 1 MG tablet Take 1 mg by mouth 2 (two) times daily.  2   Blood Glucose Calibration (ACCU-CHEK GUIDE CONTROL) LIQD 1 drop by In Vitro route as directed. E11.9 1 each 1   Blood Glucose Monitoring Suppl (ACCU-CHEK GUIDE ME) w/Device KIT 1 each by Does not apply route in the morning and at bedtime. E11.9 1 kit 0   calcitRIOL (ROCALTROL) 0.25 MCG capsule Take 1 capsule (0.25 mcg total) by mouth daily. 90 capsule 1   Cholecalciferol (VITAMIN D3) 50 MCG (2000 UT) capsule Take 2,000 Units by mouth daily.     divalproex (DEPAKOTE ER) 500 MG 24 hr tablet Take 1,000 mg by mouth at bedtime.     famotidine (PEPCID) 20 MG tablet Take 20 mg by mouth daily.     furosemide (LASIX) 80 MG tablet TAKE ONE TABLET BY MOUTH EVERY MORNING AND HALF A TABLET BY MOUTH IN THE AFTERNOON 135 tablet 1   JARDIANCE 10 MG TABS tablet TAKE 1 TABLET BY MOUTH DAILY 90 tablet 3   LATUDA 60 MG TABS Take 60 mg by mouth at bedtime.      levothyroxine (SYNTHROID) 50 MCG tablet Take 1 tablet (50 mcg total) by mouth daily before breakfast. 90 tablet 1   metoprolol tartrate (LOPRESSOR) 25 MG tablet Take 25 mg by mouth 2 (two) times daily.     nitroGLYCERIN (NITROSTAT) 0.4 MG SL tablet Place 1 tablet (0.4 mg total) under the tongue every 5 (five) minutes as needed for chest pain. For chest pain 25 tablet 3   NON FORMULARY Place 1 drop into both eyes 2 (two) times daily. Regener-Eyes eye drops     OLANZapine (ZYPREXA) 15 MG tablet Take 15 mg by mouth at bedtime.     rosuvastatin (CRESTOR) 20 MG tablet Take 1 tablet (20 mg total) by mouth daily. 90 tablet 1   tirzepatide (MOUNJARO) 12.5 MG/0.5ML Pen INJECT 12.5 MG UNDER THE SKIN ONCE WEEKLY 6 mL 0   XARELTO 20 MG TABS tablet TAKE 1 TABLET BY MOUTH AT BEDTIME 90 tablet 3   No current facility-administered medications on file prior to visit.    Review of Systems:  As per HPI- otherwise negative.   Physical  Examination: Vitals:   03/26/23 0836  BP: 118/60  Pulse: 85  Resp: 18  Temp: 97.9 F (36.6 C)  SpO2: 98%   Vitals:   03/26/23 0836  Weight: 196 lb (88.9 kg)  Height: 5\' 11"  (1.803 m)   Body mass index is 27.34 kg/m. Ideal Body Weight: Weight in (lb) to have BMI = 25: 178.9  GEN: no acute distress.  Tall build, mild overweight.  Looks well HEENT: Atraumatic, Normocephalic.  Bilateral TM wnl, oropharynx normal.  PEERL,EOMI.   Ears and Nose: No external deformity. CV: RRR, No M/G/R. No JVD. No thrill. No extra heart sounds. PULM: CTA B, no wheezes, crackles, rhonchi. No retractions. No resp. distress. No accessory muscle use. ABD: S, NT, ND, +BS. No rebound. No HSM. EXTR: No c/c/e PSYCH: Normally interactive. Conversant.    Assessment and Plan: Type 2 diabetes mellitus without complication, without long-term current use of insulin (HCC) - Plan: Comprehensive metabolic panel, Hemoglobin A1c  Acquired hypothyroidism - Plan: TSH  Pure hypercholesterolemia - Plan: Lipid panel  HYPERTENSION, BENIGN ESSENTIAL - Plan: CBC, Comprehensive metabolic panel  Paroxysmal atrial fibrillation (HCC)  Coronary artery disease involving native coronary artery of native heart without angina pectoris  Primary hyperparathyroidism (HCC)  Vitamin D deficiency  Patient seen today for follow-up.  A1c, thyroid, lipid panel, CBC, CMP all pending.  Her hyperparathyroidism is being managed by nephrology, will check calcium level today.  Nephrology also recently checked her vitamin D level Blood pressure is under good control She is compliant with anticoagulation and has not noted any recent arrhythmia.  She is seeing cardiology soon  Signed Abbe Amsterdam, MD  Received labs as below, message to patient Results for orders placed or performed in visit on 03/26/23  CBC   Collection Time: 03/26/23  8:51 AM  Result Value Ref Range   WBC 4.4 4.0 - 10.5 K/uL   RBC 4.60 3.87 - 5.11 Mil/uL    Platelets 127.0 (L) 150.0 - 400.0 K/uL   Hemoglobin 14.4 12.0 - 15.0 g/dL   HCT 16.1 09.6 - 04.5 %   MCV 93.5 78.0 - 100.0 fl   MCHC 33.3 30.0 - 36.0 g/dL   RDW 40.9 81.1 - 91.4 %  Comprehensive metabolic panel   Collection Time: 03/26/23  8:51 AM  Result Value Ref Range   Sodium 143 135 - 145 mEq/L   Potassium 3.8 3.5 - 5.1 mEq/L   Chloride 101 96 - 112 mEq/L   CO2 32 19 - 32 mEq/L   Glucose, Bld 94 70 - 99 mg/dL   BUN 28 (H) 6 - 23 mg/dL   Creatinine, Ser 7.82 (H) 0.40 - 1.20 mg/dL   Total Bilirubin 0.6 0.2 - 1.2 mg/dL   Alkaline Phosphatase 84 39 - 117 U/L   AST 25 0 - 37 U/L   ALT 33 0 - 35 U/L   Total Protein 6.1 6.0 - 8.3 g/dL   Albumin 3.9 3.5 - 5.2 g/dL   GFR 95.62 (L) >13.08 mL/min   Calcium 9.7 8.4 - 10.5 mg/dL  Hemoglobin M5H   Collection Time: 03/26/23  8:51 AM  Result Value Ref Range   Hgb A1c MFr Bld 5.4 4.6 - 6.5 %  TSH   Collection Time: 03/26/23  8:51 AM  Result Value Ref Range   TSH 0.97 0.35 - 5.50 uIU/mL  Lipid panel   Collection Time: 03/26/23  8:51 AM  Result Value Ref Range   Cholesterol 113 0 - 200 mg/dL   Triglycerides 84.6 0.0 - 149.0 mg/dL   HDL 96.29 >52.84 mg/dL   VLDL 13.2 0.0 - 44.0 mg/dL   LDL Cholesterol 48 0 - 99 mg/dL   Total CHOL/HDL Ratio 2    NonHDL 65.87    *Note: Due to a large number of results and/or encounters for the requested time period, some results have not been displayed. A complete set of results can be found in Results Review.

## 2023-03-21 ENCOUNTER — Other Ambulatory Visit: Payer: Self-pay | Admitting: Obstetrics and Gynecology

## 2023-03-21 DIAGNOSIS — Z1231 Encounter for screening mammogram for malignant neoplasm of breast: Secondary | ICD-10-CM

## 2023-03-26 ENCOUNTER — Ambulatory Visit: Payer: Medicare PPO | Admitting: Family Medicine

## 2023-03-26 ENCOUNTER — Encounter: Payer: Self-pay | Admitting: Family Medicine

## 2023-03-26 VITALS — BP 118/60 | HR 85 | Temp 97.9°F | Resp 18 | Ht 71.0 in | Wt 196.0 lb

## 2023-03-26 DIAGNOSIS — I480 Paroxysmal atrial fibrillation: Secondary | ICD-10-CM | POA: Diagnosis not present

## 2023-03-26 DIAGNOSIS — E119 Type 2 diabetes mellitus without complications: Secondary | ICD-10-CM | POA: Diagnosis not present

## 2023-03-26 DIAGNOSIS — I10 Essential (primary) hypertension: Secondary | ICD-10-CM | POA: Diagnosis not present

## 2023-03-26 DIAGNOSIS — I2510 Atherosclerotic heart disease of native coronary artery without angina pectoris: Secondary | ICD-10-CM

## 2023-03-26 DIAGNOSIS — E210 Primary hyperparathyroidism: Secondary | ICD-10-CM

## 2023-03-26 DIAGNOSIS — E7800 Pure hypercholesterolemia, unspecified: Secondary | ICD-10-CM | POA: Diagnosis not present

## 2023-03-26 DIAGNOSIS — E039 Hypothyroidism, unspecified: Secondary | ICD-10-CM | POA: Diagnosis not present

## 2023-03-26 DIAGNOSIS — E559 Vitamin D deficiency, unspecified: Secondary | ICD-10-CM

## 2023-03-26 LAB — LIPID PANEL
Cholesterol: 113 mg/dL (ref 0–200)
HDL: 47.4 mg/dL (ref >39.00)
LDL Cholesterol: 48 mg/dL (ref 0–99)
NonHDL: 65.87
Total CHOL/HDL Ratio: 2
Triglycerides: 87 mg/dL (ref 0.0–149.0)
VLDL: 17.4 mg/dL (ref 0.0–40.0)

## 2023-03-26 LAB — COMPREHENSIVE METABOLIC PANEL
ALT: 33 U/L (ref 0–35)
AST: 25 U/L (ref 0–37)
Albumin: 3.9 g/dL (ref 3.5–5.2)
Alkaline Phosphatase: 84 U/L (ref 39–117)
BUN: 28 mg/dL — ABNORMAL HIGH (ref 6–23)
CO2: 32 meq/L (ref 19–32)
Calcium: 9.7 mg/dL (ref 8.4–10.5)
Chloride: 101 meq/L (ref 96–112)
Creatinine, Ser: 1.4 mg/dL — ABNORMAL HIGH (ref 0.40–1.20)
GFR: 38.93 mL/min — ABNORMAL LOW (ref >60.00)
Glucose, Bld: 94 mg/dL (ref 70–99)
Potassium: 3.8 meq/L (ref 3.5–5.1)
Sodium: 143 meq/L (ref 135–145)
Total Bilirubin: 0.6 mg/dL (ref 0.2–1.2)
Total Protein: 6.1 g/dL (ref 6.0–8.3)

## 2023-03-26 LAB — TSH: TSH: 0.97 u[IU]/mL (ref 0.35–5.50)

## 2023-03-26 LAB — CBC
HCT: 43 % (ref 36.0–46.0)
Hemoglobin: 14.4 g/dL (ref 12.0–15.0)
MCHC: 33.3 g/dL (ref 30.0–36.0)
MCV: 93.5 fl (ref 78.0–100.0)
Platelets: 127 10*3/uL — ABNORMAL LOW (ref 150.0–400.0)
RBC: 4.6 Mil/uL (ref 3.87–5.11)
RDW: 14.7 % (ref 11.5–15.5)
WBC: 4.4 10*3/uL (ref 4.0–10.5)

## 2023-03-26 LAB — HEMOGLOBIN A1C: Hgb A1c MFr Bld: 5.4 % (ref 4.6–6.5)

## 2023-03-27 LAB — LAB REPORT - SCANNED
Magnesium: 2.5
PTH, Intact: 43

## 2023-03-28 ENCOUNTER — Ambulatory Visit: Payer: Medicare PPO | Admitting: Family Medicine

## 2023-04-01 NOTE — Progress Notes (Unsigned)
 Cardiology Office Note:  .   Date:  04/01/2023  ID:  Kathleen [name], DOB 16-Feb-1955, MRN [id number] PCP: Pearline Cables, MD  [facility] Providers Cardiologist:  Arvilla Meres, MD Electrophysiologist:  Will Jorja Loa, MD  Advanced Heart Failure:  Arvilla Meres, MD  Sleep Medicine:  Armanda Magic, MD {  History of Present Illness: .   Kathleen [name] is a 68 y.o. female w/PMHx of  HTN, HLD, OSA, DM CAD (NSTEMI in 2/09 -> cath with occlusion of septal perforator. Otherwise normal arteries) AFib > atypical Aflutter Chronic diastolic CHF  She saw Dr. Elberta Fortis 87/20/24, doing well post ablation in May 2024, no symptoms of Afib, exercising regularly on her exercise bike and elliptical was pending colonoscopy, felt to be an intermediate risk  Saw the HF team 01/10/23, going to the [facility] 6d/week, occ use of extra lasix CPAP compliant No changes  Today's visit is scheduled as a 6 mo visit  ROS:   She is doing really well Continues to go to the gym regularly, eliptical bike and some weight machines as well No CP, palpitations or cardiac awareness She doesn't think she has had any Afib though was not always aware of it No SOB, DOE No near syncope or syncope Occ bleeding gums with brushing, no bleeding or signs of bleeding otherwise She reports good CPAP compliance  Arrhythmia/AAD hx Afib 2016 AFib ablation 12/06/2021 ER Jan 2024 atypical AFlutter Amiodarone started  Jan 2024 >> stopped ~ march 2024 or fatigue Afib ablation 06/13/22  Studies Reviewed: [name]    EKG not done today  06/13/22: EPS/ablation CONCLUSIONS: 1. Sinus rhythm upon presentation.   2. Successful electrical isolation and anatomical encircling of all four pulmonary veins with radiofrequency current.  A WACA approach was used 3. Additional left atrial ablation was performed with a standard box lesion created along the posterior wall of the left atrium 4. No early apparent  complications.   06/13/22: TEE 1. Left ventricular ejection fraction, by estimation, is 55 to 60%. The  left ventricle has normal function. The left ventricle has no regional  wall motion abnormalities.   2. Right ventricular systolic function is normal. The right ventricular  size is normal.   3. No left atrial/left atrial appendage thrombus was detected.   4. The mitral valve is normal in structure. Trivial mitral valve  regurgitation.   5. The aortic valve is tricuspid. Aortic valve regurgitation is trivial.    12/06/2021: EPS/ablation CONCLUSIONS: 1. Atrial fibrillation upon presentation.   2. Successful electrical isolation and anatomical encircling of all four pulmonary veins with radiofrequency current.  A WACA approach was used 3. Additional left atrial ablation was performed with a standard box lesion created along the posterior wall of the left atrium 4. Atrial fibrillation successfully cardioverted to sinus rhythm. 5. No early apparent complications.   Risk Assessment/Calculations:    Physical Exam:   VS:  There were no vitals taken for this visit.   Wt Readings from Last 3 Encounters:  03/26/23 196 lb (88.9 kg)  01/10/23 199 lb 12.8 oz (90.6 kg)  12/11/22 203 lb (92.1 kg)    GEN: Well nourished, well developed in no acute distress NECK: No JVD; No carotid bruits CARDIAC: RRR, no murmurs, rubs, gallops RESPIRATORY:  CTA b/l without rales, wheezing or rhonchi  ABDOMEN: Soft, non-tender, non-distended EXTREMITIES: No edema; No deformity   ASSESSMENT AND PLAN: .    Persistent Afib Atypical AFlutter  CHA2DS2Vasc is 6, on Xarelto, appropriately  dosed No symptoms of Afib   Advised to see her dentist/discuss intermittent gum bleeding  CAD No anginal symptoms C/w Dr. Lynne Logan  Chronic diastolic CHF No symptoms or exam findings of volume OL C/w Dr. Lynne Logan  Secondary hypercoagulable state 2/2 AFib     Dispo: back with EP in a year, sooner if  needed  Signed, Sheilah Pigeon, PA-C

## 2023-04-05 ENCOUNTER — Ambulatory Visit: Payer: Medicare PPO | Attending: Physician Assistant | Admitting: Physician Assistant

## 2023-04-05 ENCOUNTER — Encounter: Payer: Self-pay | Admitting: Physician Assistant

## 2023-04-05 VITALS — BP 110/68 | HR 95 | Ht 71.0 in | Wt 193.0 lb

## 2023-04-05 DIAGNOSIS — I2510 Atherosclerotic heart disease of native coronary artery without angina pectoris: Secondary | ICD-10-CM | POA: Diagnosis not present

## 2023-04-05 DIAGNOSIS — I484 Atypical atrial flutter: Secondary | ICD-10-CM

## 2023-04-05 DIAGNOSIS — D6869 Other thrombophilia: Secondary | ICD-10-CM

## 2023-04-05 DIAGNOSIS — I4819 Other persistent atrial fibrillation: Secondary | ICD-10-CM

## 2023-04-05 DIAGNOSIS — I5032 Chronic diastolic (congestive) heart failure: Secondary | ICD-10-CM

## 2023-04-05 NOTE — Patient Instructions (Signed)
Medication Instructions:   Your physician recommends that you continue on your current medications as directed. Please refer to the Current Medication list given to you today.   *If you need a refill on your cardiac medications before your next appointment, please call your pharmacy*   Lab Work: NONE ORDERED  TODAY   If you have labs (blood work) drawn today and your tests are completely normal, you will receive your results only by: MyChart Message (if you have MyChart) OR A paper copy in the mail If you have any lab test that is abnormal or we need to change your treatment, we will call you to review the results.   Testing/Procedures:  NONE ORDERED  TODAY     Follow-Up: At [facility], you and your health needs are our priority.  As part of our continuing mission to provide you with exceptional heart care, we have created designated Provider Care Teams.  These Care Teams include your primary Cardiologist (physician) and Advanced Practice Providers (APPs -  Physician Assistants and Nurse Practitioners) who all work together to provide you with the care you need, when you need it.  We recommend signing up for the patient portal called "MyChart".  Sign up information is provided on this After Visit Summary.  MyChart is used to connect with patients for Virtual Visits (Telemedicine).  Patients are able to view lab/test results, encounter notes, upcoming appointments, etc.  Non-urgent messages can be sent to your provider as well.   To learn more about what you can do with MyChart, go to [email or web address].    Your next appointment:   1 year(s)  Provider:   Loman Brooklyn, MD or Francis Dowse, PA-C    Other Instructions

## 2023-04-09 ENCOUNTER — Encounter: Payer: Self-pay | Admitting: Family Medicine

## 2023-04-15 ENCOUNTER — Other Ambulatory Visit: Payer: Self-pay | Admitting: Family Medicine

## 2023-04-19 ENCOUNTER — Encounter (HOSPITAL_COMMUNITY): Payer: Self-pay | Admitting: Internal Medicine

## 2023-04-19 ENCOUNTER — Ambulatory Visit (HOSPITAL_COMMUNITY)
Admission: RE | Admit: 2023-04-19 | Discharge: 2023-04-19 | Disposition: A | Discharge status: Home / Self Care | Payer: Medicare PPO | Source: Ambulatory Visit | Attending: Internal Medicine | Admitting: Internal Medicine

## 2023-04-19 VITALS — BP 100/60 | HR 92 | Wt 189.2 lb

## 2023-04-19 DIAGNOSIS — I2510 Atherosclerotic heart disease of native coronary artery without angina pectoris: Secondary | ICD-10-CM | POA: Insufficient documentation

## 2023-04-19 DIAGNOSIS — N1831 Chronic kidney disease, stage 3a: Secondary | ICD-10-CM | POA: Insufficient documentation

## 2023-04-19 DIAGNOSIS — I252 Old myocardial infarction: Secondary | ICD-10-CM | POA: Diagnosis not present

## 2023-04-19 DIAGNOSIS — E663 Overweight: Secondary | ICD-10-CM

## 2023-04-19 DIAGNOSIS — N183 Chronic kidney disease, stage 3 unspecified: Secondary | ICD-10-CM

## 2023-04-19 DIAGNOSIS — I130 Hypertensive heart and chronic kidney disease with heart failure and stage 1 through stage 4 chronic kidney disease, or unspecified chronic kidney disease: Secondary | ICD-10-CM | POA: Insufficient documentation

## 2023-04-19 DIAGNOSIS — Z7901 Long term (current) use of anticoagulants: Secondary | ICD-10-CM | POA: Diagnosis not present

## 2023-04-19 DIAGNOSIS — Z79899 Other long term (current) drug therapy: Secondary | ICD-10-CM | POA: Diagnosis not present

## 2023-04-19 DIAGNOSIS — Z6826 Body mass index (BMI) 26.0-26.9, adult: Secondary | ICD-10-CM | POA: Insufficient documentation

## 2023-04-19 DIAGNOSIS — G4733 Obstructive sleep apnea (adult) (pediatric): Secondary | ICD-10-CM | POA: Insufficient documentation

## 2023-04-19 DIAGNOSIS — Z7984 Long term (current) use of oral hypoglycemic drugs: Secondary | ICD-10-CM | POA: Insufficient documentation

## 2023-04-19 DIAGNOSIS — E1122 Type 2 diabetes mellitus with diabetic chronic kidney disease: Secondary | ICD-10-CM | POA: Insufficient documentation

## 2023-04-19 DIAGNOSIS — I5032 Chronic diastolic (congestive) heart failure: Secondary | ICD-10-CM | POA: Diagnosis not present

## 2023-04-19 DIAGNOSIS — I480 Paroxysmal atrial fibrillation: Secondary | ICD-10-CM | POA: Diagnosis not present

## 2023-04-19 NOTE — Patient Instructions (Signed)
 Great to see you today   No medication changes   Your physician recommends that you schedule a follow-up appointment in: 12 months(March 2026) call in Jan[place] to schedule an appointment  If you have any questions or concerns before your next appointment please send [place] a message through [place] or call our office at [phone].    TO LEAVE A MESSAGE FOR THE NURSE SELECT OPTION 2, PLEASE LEAVE A MESSAGE INCLUDING: YOUR NAME DATE OF BIRTH CALL BACK NUMBER REASON FOR CALL**this is important as we prioritize the call backs  YOU WILL RECEIVE A CALL BACK THE SAME DAY AS LONG AS YOU CALL BEFORE 4:00 PM  At the [facility], you and your health needs are our priority. As part of our continuing mission to provide you with exceptional heart care, we have created designated Provider Care Teams. These Care Teams include your primary Cardiologist (physician) and Advanced Practice Providers (APPs- Physician Assistants and Nurse Practitioners) who all work together to provide you with the care you need, when you need it.   You may see any of the following providers on your designated Care Team at your next follow up: Dr Arvilla Meres Dr Marca Ancona Dr. Dorthula Nettles Dr. Clearnce Hasten [name] Filbert Schilder, NP Robbie Lis, [place] [facility], [place] Brynda Peon, NP [place] Lee, NP Clarisa Kindred, NP Karle Plumber, PharmD Enos Fling, PharmD   Please be sure to bring in all your medications bottles to every appointment.    Thank you for choosing [facility]

## 2023-04-19 NOTE — Progress Notes (Signed)
 ADVANCED HF CLINIC NOTE  Date:  04/19/2023   ID:  Kathleen [name], DOB 03-Aug-1955, MRN [id number]  Location: Home  Provider location: [facility] Type of Visit: Established patient  PCP:  [name], [name], MD  HF Cardiologist:  Arvilla Meres, MD  Chief Complaint: follow up heart failure    HPI: [name] is a 68 y.o.woman (mother of Kathleen [name] - ICU nurse) with a h/o morbid obesity, chronic diastolic HF, DM2, HTN, HL, PAF and CAD. Sleep study 11/2013 was negative.     NSTEMI in 2/09 -> cath with occlusion of septal perforator. Otherwise normal arteries.  Admitted 8/16 for Afib RVR. She converted to sinus tach with dilt drip and was transitioned to Toprol-XL 25 mg daily.   LHC 12/17 with widely patent coronary arteries. LVEF 55%. Mildly elevated LVEDP.   Admitted 2/21 for atrial fibrillation w/ RVR in the setting of CAP and also w/ a/c diastolic HF. Echo EF 60-65%. Hospital course also notable for AKI. SCr rose to 2.09 during admit but improved down to 1.46 on d/c. D/c wt 330 lb.  Zio patch 3/22 - 3% AF burden.  Echo 1/23 showed EF 55-60%, grade I DD, mildly reduced RV, mild to moderate TR, mild AI.  Zio 5/23 - AF burden 15%. 3 runs SVT  Underwent AF ablation 12/06/21  [place] sleep study (6/23) with AHI 12.5/hr.  Follow up 12/23, doing well NYHA II. Entresto stopped with low BP. Seen in ED 01/30/22 with AFL RVR, underwent DCCV to NSR. Fllow up with EP (1/24), started on amiodarone 200 daily but later stopped due to fatigue.  S/p TEE & AF ablation (5/24) EF 55-60%, normal RV, no LAA thombus or PFO  Today she returns for HF follow up. Has lost 104 pounds with diet, exercise and Mounjaro. A1c 5.5. Has so much more energy. [facility] [employer] and [facility] for and then weight machines. Denies CP or SOB. Swelling controlled on lasix 80 daily   Past Medical History:  Diagnosis Date   Anemia    Anxiety    Asthma    Atrial  fibrillation (HCC)    Bipolar disorder (HCC)    CAD (coronary artery disease)    Cancer (HCC)    basal cell carcinoma on right hand, papilloma left breast   CHF (congestive heart failure) (HCC)    pt seen at heart/vascular spec clinic 08/14/2014    Chronic kidney disease    Coarse tremors    hands   Colon polyps    Coronary artery disease    Depression    Depression    Diabetes (HCC)    DVT (deep vein thrombosis) in pregnancy    pt. 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HCC) 02/2007   non-q-wave with second septal perforator   Hyperlipidemia    on meds   Hypertension    on meds   Hypothyroidism    history of took medication, has resolved   Knee pain, left    Lower extremity deep venous thrombosis (HCC)    20 years ago    Measles    hx of in childhood    Morbid obesity with BMI of 40.0-44.9, adult (HCC)    Osteopenia    Pain at surgical incision  Left breast from procedure 09/11/16   Pneumonia    hx of    PONV (postoperative nausea and vomiting)    also slow to wake up   Psychiatric hospitalization 05/2008   Shingles    20 years ago    Shortness of breath dyspnea    exercise    Sleep apnea    uses CPAP   Type 2 diabetes mellitus (HCC) 12/31/2015   Urinary incontinence    UTI (urinary tract infection)    Past Surgical History:  Procedure Laterality Date   ATRIAL FIBRILLATION ABLATION N/A 12/06/2021   Procedure: ATRIAL FIBRILLATION ABLATION;  Surgeon: Regan Lemming, MD;  Location: [facility] IN[facility]AS[facility] CV [facility];  Service: Cardiovascular;  Laterality: N/A;   ATRIAL FIBRILLATION ABLATION N/A 06/13/2022   Procedure: ATRIAL FIBRILLATION ABLATION;  Surgeon: Regan Lemming, MD;  Location: [facility];  Service: Cardiovascular;  Laterality: N/A;   BASAL CELL CARCINOMA EXCISION     BREAST EXCISIONAL BIOPSY Left 2018   BREAST  LUMPECTOMY WITH RADIOACTIVE SEED LOCALIZATION Left 09/11/2016   Procedure: LEFT BREAST LUMPECTOMY WITH RADIOACTIVE SEED LOCALIZATION;  Surgeon: Glenna Fellows, MD;  Location: [facility] OR;  Service: General;  Laterality: Left;   BREAST MASS EXCISION     age 5, benign tumor   CARDIAC CATHETERIZATION     CARDIAC CATHETERIZATION N/A 12/30/2015   Procedure: Left Heart Cath and Coronary Angiography;  Surgeon: Lyn Records, MD;  Location: [facility] INVAS[facility] CV LAB;  Service: Cardiovascular;  Laterality: N/A;   COLONOSCOPY  07/15/2009   [facility]   DILATATION & CURETTAGE/HYSTEROSCOPY WITH MYOSURE N/A 09/22/2016   Procedure: DILATATION & CURETTAGE/HYSTEROSCOPY WITH MYOSURE;  Surgeon: Richarda Overlie, MD;  Location: [facility] ORS;  Service: Gynecology;  Laterality: N/A;   DILATION AND CURETTAGE OF UTERUS     KNEE SURGERY     right, removed cartilage   PARATHYROIDECTOMY N/A 08/25/2014   Procedure: PARATHYROIDECTOMY;  Surgeon: Darnell Level, MD;  Location: [facility] ORS;  Service: General;  Laterality: N/A;   SHOULDER ARTHROSCOPY WITH ROTATOR CUFF REPAIR AND SUBACROMIAL DECOMPRESSION Right 02/27/2017   Procedure: Right shoulder arthroscopic rotator cuff repair with biceps tenodesis and subacromial decompression;  Surgeon: Yolonda Kida, MD;  Location: [facility] OR;  Service: Orthopedics;  Laterality: Right;  150 mins   TEE WITHOUT CARDIOVERSION N/A 06/13/2022   Procedure: TRANSESOPHAGEAL ECHOCARDIOGRAM;  Surgeon: Dolores Patty, MD;  Location: [facility] INVASIVE CV LAB;  Service: Cardiovascular;  Laterality: N/A;   TONSILLECTOMY     TUBAL LIGATION     Current Outpatient Medications  Medication Sig Dispense Refill   ACCU-CHEK GUIDE test strip USE AS INSTRUCTED 300 strip 3   Accu-Chek Softclix Lancets lancets USE AS DIRECTED 300 each 3   allopurinol (ZYLOPRIM) 100 MG tablet Take 1 tablet (100 mg total) by mouth 2 (two) times daily. 180 tablet 1   benztropine (COGENTIN) 1 MG tablet Take 1 mg by mouth 2 (two) times daily.  2    Blood Glucose Calibration (ACCU-CHEK GUIDE CONTROL) LIQD 1 drop by In Vitro route as directed. E11.9 1 each 1   Blood Glucose Monitoring Suppl (ACCU-CHEK GUIDE ME) w/Device KIT 1 each by Does not apply route in the morning and at bedtime. E11.9 1 kit 0   calcitRIOL (ROCALTROL) 0.25 MCG capsule Take 1 capsule (0.25 mcg total) by mouth daily. 90 capsule 1   Cholecalciferol (VITAMIN D3) 50 MCG (2000 UT) capsule Take 2,000 Units by mouth daily.     divalproex (DEPAKOTE ER) 500 MG 24 hr  tablet Take 1,000 mg by mouth at bedtime.     famotidine (PEPCID) 20 MG tablet Take 20 mg by mouth daily.     furosemide (LASIX) 80 MG tablet TAKE ONE TABLET BY MOUTH EVERY MORNING AND HALF A TABLET BY MOUTH IN THE AFTERNOON 135 tablet 1   JARDIANCE 10 MG TABS tablet TAKE 1 TABLET BY MOUTH DAILY 90 tablet 3   LATUDA 60 MG TABS Take 60 mg by mouth at bedtime.      levothyroxine (SYNTHROID) 50 MCG tablet Take 1 tablet (50 mcg total) by mouth daily before breakfast. 90 tablet 1   metoprolol tartrate (LOPRESSOR) 25 MG tablet Take 25 mg by mouth 2 (two) times daily.     nitroGLYCERIN (NITROSTAT) 0.4 MG SL tablet Place 1 tablet (0.4 mg total) under the tongue every 5 (five) minutes as needed for chest pain. For chest pain 25 tablet 3   NON FORMULARY Place 1 drop into both eyes 2 (two) times daily. Regener-Eyes eye drops     OLANZapine (ZYPREXA) 15 MG tablet Take 15 mg by mouth at bedtime.     rosuvastatin (CRESTOR) 20 MG tablet Take 1 tablet (20 mg total) by mouth daily. 90 tablet 1   tirzepatide (MOUNJARO) 12.5 MG/0.5ML Pen Inject 12.5 mg into the skin once a week. 6 mL 0   XARELTO 20 MG TABS tablet TAKE 1 TABLET BY MOUTH AT BEDTIME 90 tablet 3   No current facility-administered medications for this encounter.    Allergies:   Influenza vaccines, Tetanus toxoids, Tetanus-diphtheria toxoids td, and Lamictal [lamotrigine]   Social History:  The patient  reports that she has never smoked. She has been exposed to tobacco  smoke. She has never used smokeless tobacco. She reports that she does not currently use alcohol. She reports that she does not use drugs.   Family History:  The patient's family history includes Alcohol abuse in her father, maternal grandfather, maternal grandmother, mother, paternal grandfather, and paternal grandmother; Alzheimer's disease in her father and mother; Breast cancer (age of onset: 59) in her mother; Diabetes in her brother; Drug abuse in her maternal grandfather; Hypertension in her brother; Lung cancer in her mother; Parkinson's disease in her father; Schizophrenia in her cousin.   ROS:  Please see the history of present illness.   All other systems are personally reviewed and negative.   Recent Labs: 02/22/2023: Magnesium 2.5 03/26/2023: ALT 33; BUN 28; Creatinine, Ser 1.40; Hemoglobin 14.4; Platelets 127.0; Potassium 3.8; Sodium 143; TSH 0.97  Personally reviewed   BP 100/60   Pulse 92   Wt 85.8 kg (189 lb 3.2 oz)   SpO2 97%   BMI 26.39 kg/m   Wt Readings from Last 3 Encounters:  04/19/23 85.8 kg (189 lb 3.2 oz)  04/05/23 87.5 kg (193 lb)  03/26/23 88.9 kg (196 lb)   Physical Exam General:  Well appearing. No resp difficulty HEENT: normal Neck: supple. no JVD. Carotids 2+ bilat; no bruits. No lymphadenopathy or thryomegaly appreciated. Cor: PMI nondisplaced. Regular rate & rhythm. No rubs, gallops or murmurs. Lungs: clear Abdomen: soft, nontender, nondistended. No hepatosplenomegaly. No bruits or masses. Good bowel sounds. Extremities: no cyanosis, clubbing, rash, edema Neuro: alert & orientedx3, cranial nerves grossly intact. moves all 4 extremities w/o difficulty. Affect pleasant   ASSESSMENT AND PLAN: 1. Chronic diastolic HF:  - Echo (7/17): EF 55-60% Grade IDD  - Echo (7/19): EF 55-60%  - Echo (2/21): EF 60-65%, RV ok  - Echo (1/23): EF 60-65%,  RV ok. - TEE (5/24): EF 55-60%, RV ok - Doing great NYHA I  - Off Entresto with low BP - Continue Lasix 80 mg  daily, can take extra 40 mg PRN  - Continue Jardiance 10 mg daily. No GU symptoms  - Continue metoprolol tartrate 25 mg bid in setting of PAF - Recent labs reviewed and are stable, K 3.8 SCr 1.8 -> 1.4 0n 03/26/23  2. PAF - Zio patch 3/22 - 3% AF burden - Repeat Zio 5/23 - AF burden 15% (average rate 128 in AF. 81 when in sinus). 3 runs SVT - Underwent AF ablation 12/06/21 -> in NSR - s/p AF ablation (5/24) - CHADSVASC = 6 - Remains in NSR - Continue metoprolol - Continue Xarelto 20 mg daily. No bleeding - Last hgb 16.2  3. CAD - No s/s angina  - No ASA w/ Xarelto.  - Continue statin.     4. HTN   - BP down in setting of profound weight loss   5. DM2 - HgbA1c 5.5 - Continue Jardiance - Continue GLP1  6. CKD 3a - Follows with Dr. Allena Katz. - Continue SGLT2i. - Most recent SCr 1.82 -> 1.4  7. Overweight - Body mass index is 26.39 kg/m. - Continue with exercises at [facility]. - Has lost 65 lbs with Mounjaro, overall down >90 lbs!   8. OSA - No OSA on previous sleep study in 2015 - PSG (2024) showed mild OSA with AHI 12.5 - Now on CPAP. Probably doesn't need any more after weight loss  Arvilla Meres, MD  04/19/2023 1:52 PM  [facility] [address] [facility] [place] [address] [phone] (office) [phone] (fax)

## 2023-04-19 NOTE — Addendum Note (Signed)
 Encounter addended by: Suezanne Cheshire, RN on: 04/19/2023 2:18 PM  Actions taken: Clinical Note Signed

## 2023-04-28 ENCOUNTER — Other Ambulatory Visit: Payer: Self-pay | Admitting: Family Medicine

## 2023-04-28 DIAGNOSIS — R7989 Other specified abnormal findings of blood chemistry: Secondary | ICD-10-CM

## 2023-05-08 ENCOUNTER — Ambulatory Visit: Payer: Medicare PPO | Admitting: Medical Oncology

## 2023-05-08 ENCOUNTER — Other Ambulatory Visit: Payer: Medicare PPO

## 2023-05-10 ENCOUNTER — Ambulatory Visit
Admission: RE | Admit: 2023-05-10 | Discharge: 2023-05-10 | Disposition: A | Discharge status: Home / Self Care | Payer: Medicare PPO | Source: Ambulatory Visit | Attending: Obstetrics and Gynecology | Admitting: Obstetrics and Gynecology

## 2023-05-10 DIAGNOSIS — Z1231 Encounter for screening mammogram for malignant neoplasm of breast: Secondary | ICD-10-CM | POA: Diagnosis not present

## 2023-05-23 DIAGNOSIS — F411 Generalized anxiety disorder: Secondary | ICD-10-CM | POA: Diagnosis not present

## 2023-05-23 DIAGNOSIS — F3112 Bipolar disorder, current episode manic without psychotic features, moderate: Secondary | ICD-10-CM | POA: Diagnosis not present

## 2023-06-12 ENCOUNTER — Ambulatory Visit (INDEPENDENT_AMBULATORY_CARE_PROVIDER_SITE_OTHER)

## 2023-06-12 VITALS — Ht 71.0 in | Wt 185.0 lb

## 2023-06-12 DIAGNOSIS — G4733 Obstructive sleep apnea (adult) (pediatric): Secondary | ICD-10-CM | POA: Diagnosis not present

## 2023-06-12 DIAGNOSIS — Z0000 Encounter for general adult medical examination without abnormal findings: Secondary | ICD-10-CM

## 2023-06-12 NOTE — Patient Instructions (Addendum)
 Ms. Postma , Thank you for taking time out of your busy schedule to complete your Annual Wellness Visit with me. I enjoyed our conversation and look forward to speaking with you again next year. I, as well as your care team,  appreciate your ongoing commitment to your health goals. Please review the following plan we discussed and let me know if I can assist you in the future. Your Game plan/ To Do List    Referrals: If you haven't heard from the office you've been referred to, please reach out to them at the phone provided.   Follow up Visits: Next Medicare AWV with our clinical staff: 06/17/24 @ 1:10p   Have you seen your provider in the last 6 months (3 months if uncontrolled diabetes)?  Next Office Visit with your provider: 09/27/23 @ 10:20a  Clinician Recommendations:  Aim for 30 minutes of exercise or brisk walking, 6-8 glasses of water, and 5 servings of fruits and vegetables each day.       This is a list of the screening recommended for you and due dates:  Health Maintenance  Topic Date Due   Yearly kidney health urinalysis for diabetes  03/30/2023   COVID-19 Vaccine (11 - Mixed Product risk 2024-25 season) 04/10/2023   Flu Shot  08/24/2023   Hemoglobin A1C  09/26/2023   Complete foot exam   10/02/2023   Eye exam for diabetics  11/28/2023   Yearly kidney function blood test for diabetes  03/25/2024   Mammogram  05/09/2024   Medicare Annual Wellness Visit  06/11/2024   DTaP/Tdap/Td vaccine (6 - Td or Tdap) 10/09/2028   Pneumonia Vaccine  Completed   DEXA scan (bone density measurement)  Completed   Hepatitis C Screening  Completed   Zoster (Shingles) Vaccine  Completed   HPV Vaccine  Aged Out   Meningitis B Vaccine  Aged Out   Colon Cancer Screening  Discontinued   Cologuard (Stool DNA test)  Discontinued    Advanced directives: (In Chart) A copy of your advanced directives are scanned into your chart should your provider ever need it. Advance Care Planning is important  because it:  [x]  Makes sure you receive the medical care that is consistent with your values, goals, and preferences  [x]  It provides guidance to your family and loved ones and reduces their decisional burden about whether or not they are making the right decisions based on your wishes.  Follow the link provided in your after visit summary or read over the paperwork we have mailed to you to help you started getting your Advance Directives in place. If you need assistance in completing these, please reach out to us  so that we can help you!  See attachments for Preventive Care and Fall Prevention Tips.
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
## 2023-06-20 ENCOUNTER — Other Ambulatory Visit: Payer: Self-pay | Admitting: Obstetrics and Gynecology

## 2023-06-20 DIAGNOSIS — Z9189 Other specified personal risk factors, not elsewhere classified: Secondary | ICD-10-CM

## 2023-07-02 ENCOUNTER — Encounter: Payer: Self-pay | Admitting: Obstetrics and Gynecology

## 2023-07-05 DIAGNOSIS — J069 Acute upper respiratory infection, unspecified: Secondary | ICD-10-CM | POA: Diagnosis not present

## 2023-07-08 ENCOUNTER — Telehealth: Admitting: Physician Assistant

## 2023-07-08 DIAGNOSIS — J019 Acute sinusitis, unspecified: Secondary | ICD-10-CM | POA: Diagnosis not present

## 2023-07-08 DIAGNOSIS — B9689 Other specified bacterial agents as the cause of diseases classified elsewhere: Secondary | ICD-10-CM

## 2023-07-08 MED ORDER — DOXYCYCLINE HYCLATE 100 MG PO TABS: 100.0000 mg | ORAL_TABLET | Freq: Two times a day (BID) | ORAL | 0 refills | Status: DC

## 2023-07-08 NOTE — Progress Notes (Signed)

## 2023-07-08 NOTE — Progress Notes (Signed)
 I have spent 5 minutes in review of e-visit questionnaire, review and updating patient chart, medical decision making and response to patient.   Piedad Climes, PA-C

## 2023-07-11 ENCOUNTER — Other Ambulatory Visit: Payer: Self-pay | Admitting: Family Medicine

## 2023-07-13 DIAGNOSIS — J40 Bronchitis, not specified as acute or chronic: Secondary | ICD-10-CM | POA: Diagnosis not present

## 2023-07-13 DIAGNOSIS — R059 Cough, unspecified: Secondary | ICD-10-CM | POA: Diagnosis not present

## 2023-07-19 ENCOUNTER — Encounter: Payer: Self-pay | Admitting: Family Medicine

## 2023-07-24 ENCOUNTER — Other Ambulatory Visit: Payer: Self-pay | Admitting: Obstetrics and Gynecology

## 2023-07-24 ENCOUNTER — Ambulatory Visit
Admission: RE | Admit: 2023-07-24 | Discharge: 2023-07-24 | Disposition: A | Discharge status: Home / Self Care | Source: Ambulatory Visit | Attending: Obstetrics and Gynecology | Admitting: Obstetrics and Gynecology

## 2023-07-24 DIAGNOSIS — Z9189 Other specified personal risk factors, not elsewhere classified: Secondary | ICD-10-CM

## 2023-07-24 DIAGNOSIS — R9389 Abnormal findings on diagnostic imaging of other specified body structures: Secondary | ICD-10-CM

## 2023-07-24 DIAGNOSIS — Z1239 Encounter for other screening for malignant neoplasm of breast: Secondary | ICD-10-CM | POA: Diagnosis not present

## 2023-07-24 DIAGNOSIS — Z803 Family history of malignant neoplasm of breast: Secondary | ICD-10-CM | POA: Diagnosis not present

## 2023-07-24 MED ORDER — GADOPICLENOL 0.5 MMOL/ML IV SOLN
9.0000 mL | Freq: Once | INTRAVENOUS | Status: AC | PRN
  Administered 2023-07-24: 9 mL via INTRAVENOUS

## 2023-07-26 ENCOUNTER — Ambulatory Visit
Admission: RE | Admit: 2023-07-26 | Discharge: 2023-07-26 | Disposition: A | Discharge status: Home / Self Care | Source: Ambulatory Visit | Attending: Obstetrics and Gynecology | Admitting: Obstetrics and Gynecology

## 2023-07-26 DIAGNOSIS — R9389 Abnormal findings on diagnostic imaging of other specified body structures: Secondary | ICD-10-CM

## 2023-07-26 DIAGNOSIS — N6011 Diffuse cystic mastopathy of right breast: Secondary | ICD-10-CM | POA: Diagnosis not present

## 2023-07-26 DIAGNOSIS — N6341 Unspecified lump in right breast, subareolar: Secondary | ICD-10-CM | POA: Diagnosis not present

## 2023-07-26 MED ORDER — GADOPICLENOL 0.5 MMOL/ML IV SOLN
9.0000 mL | Freq: Once | INTRAVENOUS | Status: AC | PRN
Start: 2023-07-26 — End: 2023-07-26
  Administered 2023-07-26: 9 mL via INTRAVENOUS

## 2023-07-28 ENCOUNTER — Encounter: Payer: Self-pay | Admitting: Cardiology

## 2023-07-30 LAB — SURGICAL PATHOLOGY

## 2023-08-06 DIAGNOSIS — C44319 Basal cell carcinoma of skin of other parts of face: Secondary | ICD-10-CM | POA: Diagnosis not present

## 2023-08-06 DIAGNOSIS — L820 Inflamed seborrheic keratosis: Secondary | ICD-10-CM | POA: Diagnosis not present

## 2023-08-06 DIAGNOSIS — Z85828 Personal history of other malignant neoplasm of skin: Secondary | ICD-10-CM | POA: Diagnosis not present

## 2023-08-09 DIAGNOSIS — M79675 Pain in left toe(s): Secondary | ICD-10-CM | POA: Diagnosis not present

## 2023-08-09 DIAGNOSIS — E119 Type 2 diabetes mellitus without complications: Secondary | ICD-10-CM | POA: Diagnosis not present

## 2023-08-09 DIAGNOSIS — I739 Peripheral vascular disease, unspecified: Secondary | ICD-10-CM | POA: Diagnosis not present

## 2023-08-09 DIAGNOSIS — M2011 Hallux valgus (acquired), right foot: Secondary | ICD-10-CM | POA: Diagnosis not present

## 2023-08-09 DIAGNOSIS — B351 Tinea unguium: Secondary | ICD-10-CM | POA: Diagnosis not present

## 2023-08-16 DIAGNOSIS — N390 Urinary tract infection, site not specified: Secondary | ICD-10-CM | POA: Diagnosis not present

## 2023-08-16 DIAGNOSIS — N1832 Chronic kidney disease, stage 3b: Secondary | ICD-10-CM | POA: Diagnosis not present

## 2023-08-20 DIAGNOSIS — M8588 Other specified disorders of bone density and structure, other site: Secondary | ICD-10-CM | POA: Diagnosis not present

## 2023-08-20 DIAGNOSIS — N958 Other specified menopausal and perimenopausal disorders: Secondary | ICD-10-CM | POA: Diagnosis not present

## 2023-08-22 DIAGNOSIS — D631 Anemia in chronic kidney disease: Secondary | ICD-10-CM | POA: Diagnosis not present

## 2023-08-22 DIAGNOSIS — N1832 Chronic kidney disease, stage 3b: Secondary | ICD-10-CM | POA: Diagnosis not present

## 2023-08-22 DIAGNOSIS — I129 Hypertensive chronic kidney disease with stage 1 through stage 4 chronic kidney disease, or unspecified chronic kidney disease: Secondary | ICD-10-CM | POA: Diagnosis not present

## 2023-08-22 DIAGNOSIS — E210 Primary hyperparathyroidism: Secondary | ICD-10-CM | POA: Diagnosis not present

## 2023-08-31 ENCOUNTER — Telehealth: Payer: Self-pay | Admitting: Gastroenterology

## 2023-08-31 NOTE — Telephone Encounter (Signed)
 Inbound call from patient stating she has been having constipation. States she has taken miralax, dulcolax, and metamucil but she is still having constipation. Patient is requesting a call to discuss further. Please advise, thank you

## 2023-08-31 NOTE — Telephone Encounter (Signed)
 Patient last seen for recall colonoscopy in 10/24. I called and spoke with patient. Patient has been scheduled for a follow up with Deanna May, NP on Tuesday, 09/04/23 at 10:30 am. I provided patient with the office address. Patient verbalized understanding and had no concerns at the end of the call.

## 2023-09-01 DIAGNOSIS — S51819A Laceration without foreign body of unspecified forearm, initial encounter: Secondary | ICD-10-CM | POA: Diagnosis not present

## 2023-09-03 NOTE — Progress Notes (Signed)
 Chief Complaint: follow-up constipation Primary GI Doctor: Dr. San  HPI:  Patient is a  68  year old female patient with past medical history of  CAD, MI, CHF (EF 55-60%), A. fib (on Xarelto ), hypertension, CKD 3, obesity (BMI 43.3), venous stasis, bipolar disorder who presents for a evaluation of constipation .   Patient last seen in GI office on 06/08/22 by Dr. San for colon polyp surveillance, medication management.  GI History: - 06/2009: Colonoscopy at [facility]: Reportedly normal per patient - 03/03/2019: Positive Cologuard - 03/28/2019: Initial appointment in the GI clinic.  No GI symptoms. - 04/10/2019: Colonoscopy: 4 subcentimeter polyps in the descending/ascending colon (all adenomas), 2 diminutive rectal hyperplastic polyps.  Diverticulosis in the sigmoid, ascending, cecum.  Repeat in 3 years --11/15/22 colonoscopy: Two 3 to 4 mm polyps in the sigmoid colon, 1 HPP, 1 TA. Diverticulosis in the sigmoid colon, in the descending colon and in the ascending colon. Recall in 7 years.   Interval History   Patient presents for evaluation of chronic constipation. She is consuming high fiber diet with OTC Miralax po daily. She also takes stool softeners 2 capsules po daily. She can go 2 weeks without a bowel movement. No nausea. She has had abdominal discomfort with the constipation. She has lost 8lbs since Jasma Seevers intentionally, she is on Mounjaro  for Diabetes and weight watchers.   Wt Readings from Last 3 Encounters:  09/04/23 177 lb (80.3 kg)  06/12/23 185 lb (83.9 kg)  04/19/23 189 lb 3.2 oz (85.8 kg)     Past Medical History:  Diagnosis Date   Anemia    Anxiety    Asthma    Atrial fibrillation (HCC)    Bipolar disorder (HCC)    Bronchitis    CAD (coronary artery disease)    Cancer (HCC)    basal cell carcinoma on right hand, papilloma left breast   CHF (congestive heart failure) (HCC)    pt seen at heart/vascular spec clinic 08/14/2014    Chronic kidney disease     Coarse tremors    hands   Colon polyps    Coronary artery disease    Depression    Depression    Diabetes (HCC)    DVT (deep vein thrombosis) in pregnancy    pt. 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HCC) 02/2007   non-q-wave with second septal perforator   Hyperlipidemia    on meds   Hypertension    on meds   Hypothyroidism    history of took medication, has resolved   Knee pain, left    Lower extremity deep venous thrombosis (HCC)    20 years ago    Measles    hx of in childhood    Morbid obesity with BMI of 40.0-44.9, adult (HCC)    Osteopenia    Pain at surgical incision    Left breast from procedure 09/11/16   Pneumonia    hx of    PONV (postoperative nausea and vomiting)    also slow to wake up   Psychiatric hospitalization 05/2008   Shingles    20 years ago    Shortness of breath dyspnea    exercise    Sleep apnea    uses CPAP   Type 2 diabetes mellitus (HCC) 12/31/2015   Urinary incontinence  UTI (urinary tract infection)     Past Surgical History:  Procedure Laterality Date   ATRIAL FIBRILLATION ABLATION N/A 12/06/2021   Procedure: ATRIAL FIBRILLATION ABLATION;  Surgeon: Inocencio Soyla Lunger, MD;  Location: [facility] INVASIVE CV LAB;  Service: Cardiovascular;  Laterality: N/A;   ATRIAL FIBRILLATION ABLATION N/A 06/13/2022   Procedure: ATRIAL FIBRILLATION ABLATION;  Surgeon: Inocencio Soyla Lunger, MD;  Location: [facility] INVAS[facility] CV [facility];  Service: Cardiovascular;  Laterality: N/A;   BASAL CELL CARCINOMA EXCISION     BREAST EXCISIONAL BIOPSY Left 2018   BREAST LUMPECTOMY WITH RADIOACTIVE SEED LOCALIZATION Left 09/11/2016   Procedure: LEFT BREAST LUMPECTOMY WITH RADIOACTIVE SEED LOCALIZATION;  Surgeon: Mikell Katz, MD;  Location: [facility] OR;  Service: General;  Laterality: Left;   BREAST MASS EXCISION     age 79, benign tumor   CARDIAC  CATHETERIZATION     CARDIAC CATHETERIZATION N/A 12/30/2015   Procedure: Left Heart Cath and Coronary Angiography;  Surgeon: Victory LELON Sharps, MD;  Location: [facility] INVASIVE CV LAB;  Service: Cardiovascular;  Laterality: N/A;   COLONOSCOPY  07/15/2009   [facility]   DILATATION & CURETTAGE/HYSTEROSCOPY WITH MYOSURE N/A 09/22/2016   Procedure: DILATATION & CURETTAGE/HYSTEROSCOPY WITH MYOSURE;  Surgeon: Johnnye Ade, MD;  Location: [facility] ORS;  Service: Gynecology;  Laterality: N/A;   DILATION AND CURETTAGE OF UTERUS     KNEE SURGERY     right, removed cartilage   PARATHYROIDECTOMY N/A 08/25/2014   Procedure: PARATHYROIDECTOMY;  Surgeon: Krystal Spinner, MD;  Location: [facility] ORS;  Service: General;  Laterality: N/A;   SHOULDER ARTHROSCOPY WITH ROTATOR CUFF REPAIR AND SUBACROMIAL DECOMPRESSION Right 02/27/2017   Procedure: Right shoulder arthroscopic rotator cuff repair with biceps tenodesis and subacromial decompression;  Surgeon: Sharl Selinda Dover, MD;  Location: [facility] OR;  Service: Orthopedics;  Laterality: Right;  150 mins   TEE WITHOUT CARDIOVERSION N/A 06/13/2022   Procedure: TRANSESOPHAGEAL ECHOCARDIOGRAM;  Surgeon: Cherrie Toribio SAUNDERS, MD;  Location: [facility] INVASIVE CV LAB;  Service: Cardiovascular;  Laterality: N/A;   TONSILLECTOMY     TUBAL LIGATION      Current Outpatient Medications  Medication Sig Dispense Refill   ACCU-CHEK GUIDE test strip USE AS INSTRUCTED 300 strip 3   Accu-Chek Softclix Lancets lancets USE AS DIRECTED 300 each 3   allopurinol  (ZYLOPRIM ) 100 MG tablet TAKE 1 TABLET BY MOUTH 2 TIMES A DAY 180 tablet 1   benztropine  (COGENTIN ) 1 MG tablet Take 1 mg by mouth 2 (two) times daily.  2   Blood Glucose Calibration (ACCU-CHEK GUIDE CONTROL) LIQD 1 drop by In Vitro route as directed. E11.9 1 each 1   Blood Glucose Monitoring Suppl (ACCU-CHEK GUIDE ME) w/Device KIT 1 each by Does not apply route in the morning and at bedtime. E11.9 1 kit 0   calcitRIOL  (ROCALTROL ) 0.25 MCG capsule TAKE 1  CAPSULE BY MOUTH DAILY 90 capsule 1   Cholecalciferol (VITAMIN D3) 50 MCG (2000 UT) capsule Take 2,000 Units by mouth daily.     divalproex  (DEPAKOTE  ER) 500 MG 24 hr tablet Take 1,000 mg by mouth at bedtime.     docusate sodium (COLACE) 100 MG capsule Take 2 mg by mouth 2 (two) times daily.     famotidine (PEPCID) 20 MG tablet Take 20 mg by mouth daily.     furosemide  (LASIX ) 80 MG tablet TAKE ONE TABLET BY MOUTH EVERY MORNING AND HALF A TABLET BY MOUTH IN THE AFTERNOON 135 tablet 1   LATUDA  60 MG TABS Take 60 mg by mouth at bedtime.  levalbuterol  (XOPENEX  HFA) 45 MCG/ACT inhaler Inhale 2 puffs into the lungs.     levothyroxine  (SYNTHROID ) 50 MCG tablet Take 1 tablet (50 mcg total) by mouth daily before breakfast. 90 tablet 1   metoprolol  tartrate (LOPRESSOR ) 25 MG tablet Take 25 mg by mouth 2 (two) times daily.     MOUNJARO  12.5 MG/0.5ML Pen INJECT 12.5 MG UNDER THE SKIN ONCE WEEKLY 6 mL 0   nitroGLYCERIN  (NITROSTAT ) 0.4 MG SL tablet Place 1 tablet (0.4 mg total) under the tongue every 5 (five) minutes as needed for chest pain. For chest pain 25 tablet 3   NON FORMULARY Place 1 drop into both eyes 2 (two) times daily. Regener-Eyes eye drops     OLANZapine  (ZYPREXA ) 15 MG tablet Take 15 mg by mouth at bedtime.     PSYLLIUM HUSK PO Take 3 Doses by mouth daily.     rosuvastatin  (CRESTOR ) 20 MG tablet Take 1 tablet (20 mg total) by mouth daily. 90 tablet 1   XARELTO  20 MG TABS tablet TAKE 1 TABLET BY MOUTH AT BEDTIME 90 tablet 3   No current facility-administered medications for this visit.    Allergies as of 09/04/2023 - Review Complete 09/04/2023  Allergen Reaction Noted   Influenza vaccines Hives 11/30/2021   Tetanus toxoids Itching and Swelling 10/14/2018   Tetanus-diphtheria toxoids td Itching and Swelling 10/14/2018   Influenza vaccine surface adjuvant, inactivated Nausea And Vomiting 09/04/2023   Lamictal [lamotrigine] Hives and Rash 12/29/2010    Family History  Problem  Relation Age of Onset   Breast cancer Mother 73   Alcohol abuse Mother    Alzheimer's disease Mother    Lung cancer Mother    Alcohol abuse Father    Alzheimer's disease Father    Parkinson's disease Father    Diabetes Brother    Hypertension Brother    Alcohol abuse Maternal Grandmother    Alcohol abuse Maternal Grandfather    Drug abuse Maternal Grandfather    Alcohol abuse Paternal Grandmother    Alcohol abuse Paternal Grandfather    Schizophrenia Cousin    Colon cancer Neg Hx    Esophageal cancer Neg Hx    Rectal cancer Neg Hx    Stomach cancer Neg Hx    Colon polyps Neg Hx     Review of Systems:    Constitutional: No weight loss, fever, chills, weakness or fatigue HEENT: Eyes: No change in vision               Ears, Nose, Throat:  No change in hearing or congestion Skin: No rash or itching Cardiovascular: No chest pain, chest pressure or palpitations   Respiratory: No SOB or cough Gastrointestinal: See HPI and otherwise negative Genitourinary: No dysuria or change in urinary frequency Neurological: No headache, dizziness or syncope Musculoskeletal: No new muscle or joint pain Hematologic: No bleeding or bruising Psychiatric: No history of depression or anxiety    Physical Exam:  Vital signs: BP 128/78 (BP Location: Left Arm, Patient Position: Sitting, Cuff Size: Normal)   Pulse 88   Ht 5' 10.5 (1.791 m) Comment: height without shoes  Wt 177 lb (80.3 kg)   BMI 25.04 kg/m   Constitutional:   Pleasant  female appears to be in NAD, Well developed, Well nourished, alert and cooperative Throat: Oral cavity and pharynx without inflammation, swelling or lesion.  Respiratory: Respirations even and unlabored. Lungs clear to auscultation bilaterally.   No wheezes, crackles, or rhonchi.  Cardiovascular: Normal S1, S2. Regular rate  and rhythm. No peripheral edema, cyanosis or pallor.  Gastrointestinal:  Soft, nondistended, nontender. No rebound or guarding. Hypoactive  bowel sounds. No appreciable masses or hepatomegaly. Rectal:  Not performed.  Msk:  Symmetrical without gross deformities. Without edema, no deformity or joint abnormality.  Neurologic:  Alert and  oriented x4;  grossly normal neurologically.  Skin:   Dry and intact without significant lesions or rashes.  RELEVANT LABS AND IMAGING: CBC    Latest Ref Rng & Units 03/26/2023    8:51 AM 11/07/2022   10:22 AM 10/10/2022    1:38 PM  CBC  WBC 4.0 - 10.5 K/uL 4.4  4.7  5.0   Hemoglobin 12.0 - 15.0 g/dL 85.5  83.7  84.1   Hematocrit 36.0 - 46.0 % 43.0  47.6  47.6   Platelets 150.0 - 400.0 K/uL 127.0  114  136      CMP     Latest Ref Rng & Units 03/26/2023    8:51 AM 11/07/2022   10:22 AM 10/10/2022    1:38 PM  CMP  Glucose 70 - 99 mg/dL 94  95  97   BUN 6 - 23 mg/dL 28  30  32   Creatinine 0.40 - 1.20 mg/dL 8.59  8.17  8.37   Sodium 135 - 145 mEq/L 143  143  137   Potassium 3.5 - 5.1 mEq/L 3.8  4.3  4.5   Chloride 96 - 112 mEq/L 101  98  95   CO2 19 - 32 mEq/L 32  36  31   Calcium  8.4 - 10.5 mg/dL 9.7  89.2  9.9   Total Protein 6.0 - 8.3 g/dL 6.1  6.9  7.3   Total Bilirubin 0.2 - 1.2 mg/dL 0.6  0.5  0.5   Alkaline Phos 39 - 117 U/L 84  89  94   AST 0 - 37 U/L 25  22  30    ALT 0 - 35 U/L 33  22  35      Lab Results  Component Value Date   TSH 0.97 03/26/2023     Assessment: Encounter Diagnosis  Name Primary?   Chronic constipation Yes    68 year old female patient with chronic constipation not relieved with high-fiber diet and over-the-counter MiraLAX with stool softeners.  Initially we discussed increasing the MiraLAX to twice daily for the next couple of weeks and if no improvement we will provide samples of pro secretory agent Linzess  145mcg po daily.  She will message me via MyChart to update me on her progress in 1 to 2 weeks.  I suspect that the Mounjaro  has probably exacerbated her constipation and will require daily medication long term. UTD on colonoscopy screening.    Plan: -Recommend high fiber diet -Can titrate Miralax po daily to twice daily  -Stool softeners 1 capsule twice daily prn -continue Fiber capsules 1 capsule twice daily, titrate as needed -If ineffective , will start samples Linzess  1 tablet 30-45 minutes before first meal of the day with full glass of water -recall 10/2029   Thank you for the courtesy of this consult. Please call me with any questions or concerns.   Javeria Briski, FNP-C [facility] 09/04/2023, 10:41 AM  Cc: [name], Harlene BROCKS, MD

## 2023-09-04 ENCOUNTER — Ambulatory Visit: Admitting: Gastroenterology

## 2023-09-04 ENCOUNTER — Encounter: Payer: Self-pay | Admitting: Gastroenterology

## 2023-09-04 VITALS — BP 128/78 | HR 88 | Ht 70.5 in | Wt 177.0 lb

## 2023-09-04 DIAGNOSIS — K5909 Other constipation: Secondary | ICD-10-CM | POA: Diagnosis not present

## 2023-09-04 MED ORDER — LINACLOTIDE 145 MCG PO CAPS: 145.0000 ug | ORAL_CAPSULE | Freq: Every day | ORAL | Status: DC

## 2023-09-04 NOTE — Patient Instructions (Addendum)
 Constipation Recommend high fiber diet Can titrate Miralax po daily to twice daily  Stool softeners 1 capsule twice daily, if diarrhea you stop this medicine Fiber capsules 1 capsule twice daily If ineffective , will start samples Linzess  1 tablet 30-45 minutes before first meal of the day with full glass of water  _______________________________________________________  If your blood pressure at your visit was 140/90 or greater, please contact your primary care physician to follow up on this.  _______________________________________________________  If you are age 25 or older, your body mass index should be between 23-30. Your Body mass index is 25.04 kg/m. If this is out of the aforementioned range listed, please consider follow up with your Primary Care Provider.  If you are age 23 or younger, your body mass index should be between 19-25. Your Body mass index is 25.04 kg/m. If this is out of the aformentioned range listed, please consider follow up with your Primary Care Provider.   ________________________________________________________  The [facility] providers would like to encourage you to use MYCHART to communicate with providers for non-urgent requests or questions.  Due to long hold times on the telephone, sending your provider a message by [facility] may be a faster and more efficient way to get a response.  Please allow 48 business hours for a response.  Please remember that this is for non-urgent requests.  _______________________________________________________  [facility] is using a team-based approach to care.  Your team is made up of your doctor and two to three APPS. Our APPS (Nurse Practitioners and Physician Assistants) work with your physician to ensure care continuity for you. They are fully qualified to address your health concerns and develop a treatment plan. They communicate directly with your gastroenterologist to care for you. Seeing the [facility] Practitioners on your physician's team can help you by facilitating care more promptly, often allowing for earlier appointments, access to diagnostic testing, procedures, and other specialty referrals.

## 2023-09-07 ENCOUNTER — Ambulatory Visit: Payer: Self-pay

## 2023-09-07 ENCOUNTER — Encounter: Payer: Self-pay | Admitting: Family

## 2023-09-07 ENCOUNTER — Ambulatory Visit (INDEPENDENT_AMBULATORY_CARE_PROVIDER_SITE_OTHER): Admitting: Family

## 2023-09-07 VITALS — BP 104/62 | HR 86 | Ht 70.5 in | Wt 179.0 lb

## 2023-09-07 DIAGNOSIS — M109 Gout, unspecified: Secondary | ICD-10-CM

## 2023-09-07 MED ORDER — COLCHICINE 0.6 MG PO TABS: ORAL_TABLET | ORAL | 0 refills | Status: DC

## 2023-09-07 NOTE — Telephone Encounter (Signed)
 Appt scheduled

## 2023-09-07 NOTE — Progress Notes (Signed)
 Kathleen [name] is a 68 y.o. female with the following history as recorded in Epic[employer]:  Patient Active Problem List   Diagnosis Date Noted   Skin cancer 02/26/2023   Diabetes mellitus without complication (HCC) 07/12/2021   Hypercoagulable state due to paroxysmal atrial fibrillation (HCC) 06/30/2021   At increased risk of breast cancer 03/01/2021   Osteopenia 12/02/2019   AKI (acute kidney injury) (HCC)    CAP (community acquired pneumonia) 03/01/2019   Atrial fibrillation with RVR (HCC) 02/28/2019   Chronic kidney disease (CKD), stage III (moderate) (HCC) 01/14/2019   Vitamin D  deficiency 07/22/2018   Complete rotator cuff tear or rupture of right shoulder, not specified as traumatic 02/27/2017   Auditory hallucination 12/13/2016   Gout 06/29/2016   Type 2 diabetes mellitus (HCC) 12/31/2015   Coronary artery dissection    Lung nodule seen on imaging study 09/24/2014   Paroxysmal atrial fibrillation (HCC) 09/11/2014   Asthmatic bronchitis with exacerbation 05/25/2014   Primary hyperparathyroidism (HCC) 04/15/2014   Chronic diastolic heart failure (HCC) 10/02/2013   Shortness of breath 08/31/2013   Venous stasis dermatitis of both lower extremities 08/19/2013   Nocturia 06/12/2013   Morbid obesity (HCC) 08/09/2012   Urinary incontinence, urge 07/11/2012   CAD (coronary artery disease) 10/31/2010   Obstructive sleep apnea 10/31/2010   NEOPLASM OF UNCERTAIN BEHAVIOR OF SKIN 02/11/2010   Bipolar disorder (HCC) 09/14/2009   Hyperlipidemia 05/23/2008   Disorder of thyroid  02/04/2008   HYPERTENSION, BENIGN ESSENTIAL 02/04/2008   MYOCARDIAL INFARCTION, HX OF 03/05/2007    Current Outpatient Medications  Medication Sig Dispense Refill   ACCU-CHEK GUIDE test strip USE AS INSTRUCTED 300 strip 3   Accu-Chek Softclix Lancets lancets USE AS DIRECTED 300 each 3   allopurinol  (ZYLOPRIM ) 100 MG tablet TAKE 1 TABLET BY MOUTH 2 TIMES A DAY 180 tablet 1   benztropine  (COGENTIN ) 1 MG tablet  Take 1 mg by mouth 2 (two) times daily.  2   Blood Glucose Calibration (ACCU-CHEK GUIDE CONTROL) LIQD 1 drop by In Vitro route as directed. E11.9 1 each 1   Blood Glucose Monitoring Suppl (ACCU-CHEK GUIDE ME) w/Device KIT 1 each by Does not apply route in the morning and at bedtime. E11.9 1 kit 0   calcitRIOL  (ROCALTROL ) 0.25 MCG capsule TAKE 1 CAPSULE BY MOUTH DAILY 90 capsule 1   Cholecalciferol (VITAMIN D3) 50 MCG (2000 UT) capsule Take 2,000 Units by mouth daily.     colchicine  0.6 MG tablet Take 2 tabs (1.2 mg) by mouth once, then 1 tab (0.6 mg)  every 2 hours as needed until pain is gone. Max of 6 tabs in 24 hours or intolerable diarrhea. 6 tablet 0   divalproex  (DEPAKOTE  ER) 500 MG 24 hr tablet Take 1,000 mg by mouth at bedtime.     docusate sodium (COLACE) 100 MG capsule Take 2 mg by mouth 2 (two) times daily.     famotidine (PEPCID) 20 MG tablet Take 20 mg by mouth daily.     furosemide  (LASIX ) 80 MG tablet TAKE ONE TABLET BY MOUTH EVERY MORNING AND HALF A TABLET BY MOUTH IN THE AFTERNOON 135 tablet 1   LATUDA  60 MG TABS Take 60 mg by mouth at bedtime.      levalbuterol  (XOPENEX  HFA) 45 MCG/ACT inhaler Inhale 2 puffs into the lungs.     levothyroxine  (SYNTHROID ) 50 MCG tablet Take 1 tablet (50 mcg total) by mouth daily before breakfast. 90 tablet 1   metoprolol  tartrate (LOPRESSOR ) 25 MG tablet  Take 25 mg by mouth 2 (two) times daily.     MOUNJARO  12.5 MG/0.5ML Pen INJECT 12.5 MG UNDER THE SKIN ONCE WEEKLY 6 mL 0   nitroGLYCERIN  (NITROSTAT ) 0.4 MG SL tablet Place 1 tablet (0.4 mg total) under the tongue every 5 (five) minutes as needed for chest pain. For chest pain 25 tablet 3   NON FORMULARY Place 1 drop into both eyes 2 (two) times daily. Regener-Eyes eye drops     OLANZapine  (ZYPREXA ) 15 MG tablet Take 15 mg by mouth at bedtime.     polyethylene glycol powder (GLYCOLAX/MIRALAX) 17 GM/SCOOP powder Take by mouth once.     PSYLLIUM HUSK PO Take 3 Doses by mouth daily.     rosuvastatin   (CRESTOR ) 20 MG tablet Take 1 tablet (20 mg total) by mouth daily. 90 tablet 1   XARELTO  20 MG TABS tablet TAKE 1 TABLET BY MOUTH AT BEDTIME 90 tablet 3   No current facility-administered medications for this visit.    Allergies: Influenza vaccines; Tetanus toxoids; Tetanus-diphtheria toxoids td; Influenza vaccine surface adjuvant, inactivated; and Lamictal [lamotrigine]  Past Medical History:  Diagnosis Date   Anemia    Anxiety    Asthma    Atrial fibrillation (HCC)    Bipolar disorder (HCC)    Bronchitis    CAD (coronary artery disease)    Cancer (HCC)    basal cell carcinoma on right hand, papilloma left breast   CHF (congestive heart failure) (HCC)    pt seen at [facility] 08/14/2014    Chronic kidney disease    Coarse tremors    hands   Colon polyps    Coronary artery disease    Depression    Depression    Diabetes (HCC)    DVT (deep vein thrombosis) in pregnancy    pt. 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HCC) 02/2007   non-q-wave with second septal perforator   Hyperlipidemia    on meds   Hypertension    on meds   Hypothyroidism    history of took medication, has resolved   Knee pain, left    Lower extremity deep venous thrombosis (HCC)    20 years ago    Measles    hx of in childhood    Morbid obesity with BMI of 40.0-44.9, adult (HCC)    Osteopenia    Pain at surgical incision    Left breast from procedure 09/11/16   Pneumonia    hx of    PONV (postoperative nausea and vomiting)    also slow to wake up   Psychiatric hospitalization 05/2008   Shingles    20 years ago    Shortness of breath dyspnea    exercise    Sleep apnea    uses CPAP   Type 2 diabetes mellitus (HCC) 12/31/2015   Urinary incontinence    UTI (urinary tract infection)     Past Surgical History:  Procedure Laterality Date   ATRIAL  FIBRILLATION ABLATION N/A 12/06/2021   Procedure: ATRIAL FIBRILLATION ABLATION;  Surgeon: Inocencio Soyla Lunger, MD;  Location: [facility];  Service: Cardiovascular;  Laterality: N/A;   ATRIAL FIBRILLATION ABLATION N/A 06/13/2022   Procedure: ATRIAL FIBRILLATION ABLATION;  Surgeon: Inocencio Soyla Lunger, MD;  Location: [facility];  Service: Cardiovascular;  Laterality: N/A;   BASAL CELL CARCINOMA EXCISION     BREAST EXCISIONAL BIOPSY Left 2018   BREAST LUMPECTOMY WITH RADIOACTIVE SEED LOCALIZATION Left 09/11/2016   Procedure: LEFT BREAST LUMPECTOMY WITH RADIOACTIVE SEED LOCALIZATION;  Surgeon: Mikell Katz, MD;  Location: [facility] OR;  Service: General;  Laterality: Left;   BREAST MASS EXCISION     age 37, benign tumor   CARDIAC CATHETERIZATION     CARDIAC CATHETERIZATION N/A 12/30/2015   Procedure: Left Heart Cath and Coronary Angiography;  Surgeon: Victory LELON Sharps, MD;  Location: [facility] IN[facility] CV [facility];  Service: Cardiovascular;  Laterality: N/A;   COLONOSCOPY  07/15/2009   [facility]   DILATATION & CURETTAGE/HYSTEROSCOPY WITH MYOSURE N/A 09/22/2016   Procedure: DILATATION & CURETTAGE/HYSTEROSCOPY WITH MYOSURE;  Surgeon: Johnnye Ade, MD;  Location: [facility] ORS;  Service: Gynecology;  Laterality: N/A;   DILATION AND CURETTAGE OF UTERUS     KNEE SURGERY     right, removed cartilage   PARATHYROIDECTOMY N/A 08/25/2014   Procedure: PARATHYROIDECTOMY;  Surgeon: Krystal Spinner, MD;  Location: [facility] ORS;  Service: General;  Laterality: N/A;   SHOULDER ARTHROSCOPY WITH ROTATOR CUFF REPAIR AND SUBACROMIAL DECOMPRESSION Right 02/27/2017   Procedure: Right shoulder arthroscopic rotator cuff repair with biceps tenodesis and subacromial decompression;  Surgeon: Sharl Selinda Dover, MD;  Location: [facility];  Service: Orthopedics;  Laterality: Right;  150 mins   TEE WITHOUT CARDIOVERSION N/A 06/13/2022   Procedure: TRANSESOPHAGEAL ECHOCARDIOGRAM;  Surgeon: Cherrie Toribio SAUNDERS, MD;  Location: [facility] INVASIVE CV LAB;   Service: Cardiovascular;  Laterality: N/A;   TONSILLECTOMY     TUBAL LIGATION      Family History  Problem Relation Age of Onset   Breast cancer Mother 79   Alcohol abuse Mother    Alzheimer's disease Mother    Lung cancer Mother    Alcohol abuse Father    Alzheimer's disease Father    Parkinson's disease Father    Diabetes Brother    Hypertension Brother    Alcohol abuse Maternal Grandmother    Alcohol abuse Maternal Grandfather    Drug abuse Maternal Grandfather    Alcohol abuse Paternal Grandmother    Alcohol abuse Paternal Grandfather    Schizophrenia Cousin    Colon cancer Neg Hx    Esophageal cancer Neg Hx    Rectal cancer Neg Hx    Stomach cancer Neg Hx    Colon polyps Neg Hx     Social History   Tobacco Use   Smoking status: Never    Passive exposure: Past   Smokeless tobacco: Never   Tobacco comments:    Never smoker 01/03/22  Substance Use Topics   Alcohol use: Not Currently    Comment: occ.    Subjective:   Patient does have history of gout- takes Allopurinol  100 mg daily; has been doing very well on this regimen; unfortunately, she woke up with a gout flare this morning- feeling it in bother her in both toes on both feet; did eat a steak last night; does think she could be slightly dehydrated as well;   Objective:  Vitals:   09/07/23 1524  BP: 104/62  Pulse: 86  SpO2: 95%  Weight: 179 lb (81.2 kg)  Height: 5' 10.5 (1.791 m)    General: Well developed, well nourished, in no acute distress  Skin : Warm and dry.  Head: Normocephalic and atraumatic  Lungs: Respirations unlabored;  Musculoskeletal: No deformities; mild erythema noted left foot base of 1st toe Extremities: No edema, cyanosis,  clubbing  Vessels: Symmetric bilaterally  Neurologic: Alert and oriented; speech intact; face symmetrical; moves all extremities well; CNII-XII intact without focal deficit   Assessment:  1. Acute gout, unspecified cause, unspecified site     Plan:   Patient notes her kidney doctor does not want her on prednisone / will give short trial of Colchicine - use as directed- 2 at onset today- may repeat in 1 hour and then can take bid prn x 3 days; increase water; discussed benefit of tart cherry juice; if continues to have gout issues, will need to consider increasing Allopurinol  dosage to 300 mg daily; follow up with her PCP with continued symptoms.   No follow-ups on file.  No orders of the defined types were placed in this encounter.   Requested Prescriptions   Signed Prescriptions Disp Refills   colchicine  0.6 MG tablet 6 tablet 0    Sig: Take 2 tabs (1.2 mg) by mouth once, then 1 tab (0.6 mg)  every 2 hours as needed until pain is gone. Max of 6 tabs in 24 hours or intolerable diarrhea.

## 2023-09-07 NOTE — Patient Instructions (Signed)
 If the symptoms persist, please let us  know- we will have to consider a low dose of prednisone  for a few days; your Allopurinol  dosage can also be increased if the flares continue frequently.

## 2023-09-07 NOTE — Telephone Encounter (Signed)
 FYI Only or Action Required?: FYI only for provider.  Patient was last seen in primary care on 03/26/2023 by [name], Kathleen BROCKS, MD.  Called Nurse Triage reporting Gout.  Symptoms began today.  Interventions attempted: Nothing.  Symptoms are: gradually worsening.  Triage Disposition: See Physician Within 24 Hours  Patient/caregiver understands and will follow disposition?: Yes     Copied from CRM #[id number]. Topic: Clinical - Red Word Triage >> Sep 07, 2023  1:59 PM Kathleen ORN wrote: Red Word that prompted transfer to Nurse Triage: patient has gout and is acting up and does not know what did eat or did to cause it. Reason for Disposition  [1] Swollen foot AND [2] no fever  (Exceptions: Localized bump from bunions, calluses, insect bite, sting.)  Answer Assessment - Initial Assessment Questions 1. ONSET: When did the pain start?      Last night 2. LOCATION: Where is the pain located?      Both feet 3. PAIN: How bad is the pain?    (Scale 1-10; or mild, moderate, severe)     moderate 4. WORK OR EXERCISE: Has there been any recent work or exercise that involved this part of the body?      denies 5. CAUSE: What do you think is causing the foot pain?     gout 6. OTHER SYMPTOMS: Do you have any other symptoms? (e.g., leg pain, rash, fever, numbness)     denies 7. PREGNANCY: Is there any chance you are pregnant? When was your last menstrual period?     na  Protocols used: Foot Pain-A-AH

## 2023-09-10 ENCOUNTER — Ambulatory Visit: Admitting: Family Medicine

## 2023-09-12 ENCOUNTER — Ambulatory Visit: Admitting: Family Medicine

## 2023-09-12 DIAGNOSIS — G4733 Obstructive sleep apnea (adult) (pediatric): Secondary | ICD-10-CM | POA: Diagnosis not present

## 2023-09-18 ENCOUNTER — Telehealth: Payer: Self-pay

## 2023-09-18 ENCOUNTER — Ambulatory Visit: Payer: Self-pay

## 2023-09-18 DIAGNOSIS — S51812A Laceration without foreign body of left forearm, initial encounter: Secondary | ICD-10-CM | POA: Diagnosis not present

## 2023-09-18 DIAGNOSIS — Z23 Encounter for immunization: Secondary | ICD-10-CM | POA: Diagnosis not present

## 2023-09-18 NOTE — Telephone Encounter (Signed)
 Copied from CRM #[id number]. Topic: Clinical - Request for Lab/Test Order >> Sep 18, 2023  3:02 PM Aisha D wrote: Reason for CRM: Pt is requesting an order for a Tetanus shot and would like a callback with an update to schedule.

## 2023-09-18 NOTE — Telephone Encounter (Signed)
 FYI Only or Action Required?: FYI only for provider.  Patient was last seen in primary care on 09/07/2023 by Jason Leita Repine, FNP.  Called Nurse Triage reporting Laceration.  Symptoms began today.  Interventions attempted: Rest, hydration, or home remedies.  Symptoms are: unchanged.  Triage Disposition: See PCP When Office is Open (Within 3 Days)  Patient/caregiver understands and will follow disposition?: Yes, will follow disposition  Copied from CRM [id number]. Topic: Clinical - Red Word Triage >> Sep 18, 2023  2:34 PM Kathleen [name] wrote: Red Word that prompted transfer to Nurse Triage: Patient is calling in stating she got a laceration from an old rusty sewing machine just a few moments ago, patient states it is wrapped up and cleaned, unsure if it is still bleeding. Patient is unsure of her last shot. Reason for Disposition  [1] Last tetanus shot > 5 years ago AND [2] DIRTY cut  Answer Assessment - Initial Assessment Questions 1. APPEARANCE of INJURY: What does the injury look like?      Per pt states that she was cut with a rusty portion of a sewing machine 2. ONSET: How long ago did the injury occur?      Prior to call 3. LOCATION: Where is the injury located?      arm 4. SIZE: How large is the cut?      Per pt and witness, the pt sustained scrape 5. BLEEDING: Is it bleeding now? If Yes, ask: Is it difficult to stop?      Bleeding is controlled, states that she wound was cleaned and antibiotic ointment was applied. Pt states that she has not bled through the bandage. Pt is on blood thinners.  8. TETANUS: When was your last tetanus booster?     09/2018  Pt was advised to get tetanus, states that she will go to pharm for this.  Protocols used: Cuts and Lacerations-A-AH

## 2023-09-25 NOTE — Patient Instructions (Incomplete)
 It was good to see you again today!  I will be in touch with your labs asap  Recommend a covid booster this fall  Flu shot today  We will treat you for a presumed UTI with keflex  twice a day for one week- if your urine culture shows a resistant bacteria I will change it for you!  If you are not getting better or getting worse in the next couple of days please contact me!

## 2023-09-25 NOTE — Progress Notes (Unsigned)
 [name] [address], [address] [place], [place] [address] [phone]  Date:  09/27/2023   Name:  Kathleen [name]   DOB:  [birth year]   MRN:  [id number]  PCP:  Kathleen Harlene BROCKS, MD    Chief Complaint: No chief complaint on file.   History of Present Illness:  Kathleen [name] is a 68 y.o. very pleasant female patient who presents with the following:  Patient seen today for periodic follow-up-most recent visit with myself was in March History of  morbid obesity, chronic diastolic HF, DM2, hypothyroidism, hyperparathyroidism status post parathyroidectomy, HTN, HL and CAD with NSTEMI 2009, CRI.  Sleep study 11/2013 was negative. Also has history of A fib, bipolar disorder managed by Kathleen [name].  Stage III chronic kidney disease, patient at [facility].  She had an atrial fibrillation ablation in November 2023 but unfortunately has since had episodes of flutter She is on Xarelto  due to atrial fib/flutter She had a repeat ablation per Kathleen [facility] [name] 05/2022  She saw my partner Kathleen [name] a few weeks ago for gout exacerbation She also followed up with GI in August for chronic constipation Seen by nephrology in [facility] III chronic kidney disease.  They note hypertension was under good control.  They continue to monitor her primary hyperparathyroidism.  They also are monitoring anemia so she was chronic renal failure Most recent CBC on chart from March, hemoglobin 14.4.  She has actually trending more towards polycythemia  Flu shot Recommend COVID booster Needs urine micro Can update A1c Can update foot exam   Patient Active Problem List   Diagnosis Date Noted   Skin cancer 02/26/2023   Diabetes mellitus without complication (HCC) 07/12/2021   Hypercoagulable state due to paroxysmal atrial fibrillation (HCC) 06/30/2021   At increased risk of breast cancer 03/01/2021   Osteopenia 12/02/2019   AKI (acute kidney injury) (HCC)     CAP (community acquired pneumonia) 03/01/2019   Atrial fibrillation with RVR (HCC) 02/28/2019   Chronic kidney disease (CKD), stage III (moderate) (HCC) 01/14/2019   Vitamin D  deficiency 07/22/2018   Complete rotator cuff tear or rupture of right shoulder, not specified as traumatic 02/27/2017   Auditory hallucination 12/13/2016   Gout 06/29/2016   Type 2 diabetes mellitus (HCC) 12/31/2015   Coronary artery dissection    Lung nodule seen on imaging study 09/24/2014   Paroxysmal atrial fibrillation (HCC) 09/11/2014   Asthmatic bronchitis with exacerbation 05/25/2014   Primary hyperparathyroidism (HCC) 04/15/2014   Chronic diastolic heart failure (HCC) 10/02/2013   Shortness of breath 08/31/2013   Venous stasis dermatitis of both lower extremities 08/19/2013   Nocturia 06/12/2013   Morbid obesity (HCC) 08/09/2012   Urinary incontinence, urge 07/11/2012   CAD (coronary artery disease) 10/31/2010   Obstructive sleep apnea 10/31/2010   NEOPLASM OF UNCERTAIN BEHAVIOR OF SKIN 02/11/2010   Bipolar disorder (HCC) 09/14/2009   Hyperlipidemia 05/23/2008   Disorder of thyroid  02/04/2008   HYPERTENSION, BENIGN ESSENTIAL 02/04/2008   MYOCARDIAL INFARCTION, HX OF 03/05/2007    Past Medical History:  Diagnosis Date   Anemia    Anxiety    Asthma    Atrial fibrillation (HCC)    Bipolar disorder (HCC)    Bronchitis    CAD (coronary artery disease)    Cancer (HCC)    basal cell carcinoma on right hand, papilloma left breast   CHF (congestive heart failure) (HCC)    pt [facility]  08/14/2014    Chronic kidney disease    Coarse tremors    hands   Colon polyps    Coronary artery disease    Depression    Depression    Diabetes (HCC)    DVT (deep vein thrombosis) in pregnancy    pt. 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HCC) 02/2007    non-q-wave with second septal perforator   Hyperlipidemia    on meds   Hypertension    on meds   Hypothyroidism    history of took medication, has resolved   Knee pain, left    Lower extremity deep venous thrombosis (HCC)    20 years ago    Measles    hx of in childhood    Morbid obesity with BMI of 40.0-44.9, adult (HCC)    Osteopenia    Pain at surgical incision    Left breast from procedure 09/11/16   Pneumonia    hx of    PONV (postoperative nausea and vomiting)    also slow to wake up   Psychiatric hospitalization 05/2008   Shingles    20 years ago    Shortness of breath dyspnea    exercise    Sleep apnea    uses CPAP   Type 2 diabetes mellitus (HCC) 12/31/2015   Urinary incontinence    UTI (urinary tract infection)     Past Surgical History:  Procedure Laterality Date   ATRIAL FIBRILLATION ABLATION N/A 12/06/2021   Procedure: ATRIAL FIBRILLATION ABLATION;  Surgeon: Inocencio Soyla Lunger, MD;  Location: [facility] INVASIVE CV [facility];  Service: Cardiovascular;  Laterality: N/A;   ATRIAL FIBRILLATION ABLATION N/A 06/13/2022   Procedure: ATRIAL FIBRILLATION ABLATION;  Surgeon: Inocencio Soyla Lunger, MD;  Location: [facility];  Service: Cardiovascular;  Laterality: N/A;   BASAL CELL CARCINOMA EXCISION     BREAST EXCISIONAL BIOPSY Left 2018   BREAST LUMPECTOMY WITH RADIOACTIVE SEED LOCALIZATION Left 09/11/2016   Procedure: LEFT BREAST LUMPECTOMY WITH RADIOACTIVE SEED LOCALIZATION;  Surgeon: Mikell Katz, MD;  Location: [facility] OR;  Service: General;  Laterality: Left;   BREAST MASS EXCISION     age 55, benign tumor   CARDIAC CATHETERIZATION     CARDIAC CATHETERIZATION N/A 12/30/2015   Procedure: Left Heart Cath and Coronary Angiography;  Surgeon: Victory LELON Sharps, MD;  Location: [facility] INVASIVE CV LAB;  Service: Cardiovascular;  Laterality: N/A;   COLONOSCOPY  07/15/2009   [facility]   DILATATION & CURETTAGE/HYSTEROSCOPY WITH MYOSURE N/A 09/22/2016   Procedure: DILATATION &  CURETTAGE/HYSTEROSCOPY WITH MYOSURE;  Surgeon: Johnnye Ade, MD;  Location: [facility] ORS;  Service: Gynecology;  Laterality: N/A;   DILATION AND CURETTAGE OF UTERUS     KNEE SURGERY     right, removed cartilage   PARATHYROIDECTOMY N/A 08/25/2014   Procedure: PARATHYROIDECTOMY;  Surgeon: Krystal Spinner, MD;  Location: [facility] ORS;  Service: General;  Laterality: N/A;   SHOULDER ARTHROSCOPY WITH ROTATOR CUFF REPAIR AND SUBACROMIAL DECOMPRESSION Right 02/27/2017   Procedure: Right shoulder arthroscopic rotator cuff repair with biceps tenodesis and subacromial decompression;  Surgeon: Sharl Selinda Dover, MD;  Location: [facility] OR;  Service: Orthopedics;  Laterality: Right;  150 mins   TEE WITHOUT CARDIOVERSION N/A 06/13/2022   Procedure: TRANSESOPHAGEAL ECHOCARDIOGRAM;  Surgeon: Cherrie Toribio SAUNDERS, MD;  Location[facility] CV LAB;  Service:  Cardiovascular;  Laterality: N/A;   TONSILLECTOMY     TUBAL LIGATION      Social History   Tobacco Use   Smoking status: Never    Passive exposure: Past   Smokeless tobacco: Never   Tobacco comments:    Never smoker 01/03/22  Vaping Use   Vaping status: Never Used  Substance Use Topics   Alcohol use: Not Currently    Comment: occ.   Drug use: No    Family History  Problem Relation Age of Onset   Breast cancer Mother 41   Alcohol abuse Mother    Alzheimer's disease Mother    Lung cancer Mother    Alcohol abuse Father    Alzheimer's disease Father    Parkinson's disease Father    Diabetes Brother    Hypertension Brother    Alcohol abuse Maternal Grandmother    Alcohol abuse Maternal Grandfather    Drug abuse Maternal Grandfather    Alcohol abuse Paternal Grandmother    Alcohol abuse Paternal Grandfather    Schizophrenia Cousin    Colon cancer Neg Hx    Esophageal cancer Neg Hx    Rectal cancer Neg Hx    Stomach cancer Neg Hx    Colon polyps Neg Hx     Allergies  Allergen Reactions   Influenza Vaccines Hives    Can eat foods with cooked  eggs; CANNOT handle when part of flu vaccine, etc.    Tetanus Toxoids Itching and Swelling    Vigorous local reaction to tdap with local pain   Tetanus-Diphtheria Toxoids Td Itching and Swelling    Other reaction(s): Myalgias (intolerance) Vigorous Local Soft Tissue Reaction to TDAP   Influenza Vaccine Surface Adjuvant, Inactivated Nausea And Vomiting   Lamictal [Lamotrigine] Hives and Rash    Medication list has been reviewed and updated.  Current Outpatient Medications on File Prior to Visit  Medication Sig Dispense Refill   ACCU-CHEK GUIDE test strip USE AS INSTRUCTED 300 strip 3   Accu-Chek Softclix Lancets lancets USE AS DIRECTED 300 each 3   allopurinol  (ZYLOPRIM ) 100 MG tablet TAKE 1 TABLET BY MOUTH 2 TIMES A DAY 180 tablet 1   benztropine  (COGENTIN ) 1 MG tablet Take 1 mg by mouth 2 (two) times daily.  2   Blood Glucose Calibration (ACCU-CHEK GUIDE CONTROL) LIQD 1 drop by In Vitro route as directed. E11.9 1 each 1   Blood Glucose Monitoring Suppl (ACCU-CHEK GUIDE ME) w/Device KIT 1 each by Does not apply route in the morning and at bedtime. E11.9 1 kit 0   calcitRIOL  (ROCALTROL ) 0.25 MCG capsule TAKE 1 CAPSULE BY MOUTH DAILY 90 capsule 1   Cholecalciferol (VITAMIN D3) 50 MCG (2000 UT) capsule Take 2,000 Units by mouth daily.     colchicine  0.6 MG tablet Take 2 tabs (1.2 mg) by mouth once, then 1 tab (0.6 mg)  every 2 hours as needed until pain is gone. Max of 6 tabs in 24 hours or intolerable diarrhea. 6 tablet 0   divalproex  (DEPAKOTE  ER) 500 MG 24 hr tablet Take 1,000 mg by mouth at bedtime.     docusate sodium (COLACE) 100 MG capsule Take 2 mg by mouth 2 (two) times daily.     famotidine (PEPCID) 20 MG tablet Take 20 mg by mouth daily.     furosemide  (LASIX ) 80 MG tablet TAKE ONE TABLET BY MOUTH EVERY MORNING AND HALF A TABLET BY MOUTH IN THE AFTERNOON 135 tablet 1   LATUDA  60 MG TABS  Take 60 mg by mouth at bedtime.      levalbuterol  (XOPENEX  HFA) 45 MCG/ACT inhaler Inhale 2  puffs into the lungs.     levothyroxine  (SYNTHROID ) 50 MCG tablet Take 1 tablet (50 mcg total) by mouth daily before breakfast. 90 tablet 1   metoprolol  tartrate (LOPRESSOR ) 25 MG tablet Take 25 mg by mouth 2 (two) times daily.     MOUNJARO  12.5 MG/0.5ML Pen INJECT 12.5 MG UNDER THE SKIN ONCE WEEKLY 6 mL 0   nitroGLYCERIN  (NITROSTAT ) 0.4 MG SL tablet Place 1 tablet (0.4 mg total) under the tongue every 5 (five) minutes as needed for chest pain. For chest pain 25 tablet 3   NON FORMULARY Place 1 drop into both eyes 2 (two) times daily. Regener-Eyes eye drops     OLANZapine  (ZYPREXA ) 15 MG tablet Take 15 mg by mouth at bedtime.     polyethylene glycol powder (GLYCOLAX/MIRALAX) 17 GM/SCOOP powder Take by mouth once.     PSYLLIUM HUSK PO Take 3 Doses by mouth daily.     rosuvastatin  (CRESTOR ) 20 MG tablet Take 1 tablet (20 mg total) by mouth daily. 90 tablet 1   XARELTO  20 MG TABS tablet TAKE 1 TABLET BY MOUTH AT BEDTIME 90 tablet 3   No current facility-administered medications on file prior to visit.    Review of Systems:  As per HPI- otherwise negative.   Physical Examination: There were no vitals filed for this visit. There were no vitals filed for this visit. There is no height or weight on file to calculate BMI. Ideal Body Weight:    GEN: no acute distress. HEENT: Atraumatic, Normocephalic.  Ears and Nose: No external deformity. CV: RRR, No M/G/R. No JVD. No thrill. No extra heart sounds. PULM: CTA B, no wheezes, crackles, rhonchi. No retractions. No resp. distress. No accessory muscle use. ABD: S, NT, ND, +BS. No rebound. No HSM. EXTR: No c/c/e PSYCH: Normally interactive. Conversant.    Assessment and Plan: No diagnosis found.  Assessment & Plan   Signed Harlene Schroeder, MD

## 2023-09-27 ENCOUNTER — Encounter: Payer: Self-pay | Admitting: Family Medicine

## 2023-09-27 ENCOUNTER — Ambulatory Visit: Admitting: Family Medicine

## 2023-09-27 VITALS — BP 116/78 | HR 83 | Temp 97.7°F | Ht 70.5 in | Wt 176.8 lb

## 2023-09-27 DIAGNOSIS — I10 Essential (primary) hypertension: Secondary | ICD-10-CM

## 2023-09-27 DIAGNOSIS — Z23 Encounter for immunization: Secondary | ICD-10-CM

## 2023-09-27 DIAGNOSIS — I480 Paroxysmal atrial fibrillation: Secondary | ICD-10-CM | POA: Diagnosis not present

## 2023-09-27 DIAGNOSIS — E210 Primary hyperparathyroidism: Secondary | ICD-10-CM | POA: Diagnosis not present

## 2023-09-27 DIAGNOSIS — I2510 Atherosclerotic heart disease of native coronary artery without angina pectoris: Secondary | ICD-10-CM

## 2023-09-27 DIAGNOSIS — E118 Type 2 diabetes mellitus with unspecified complications: Secondary | ICD-10-CM

## 2023-09-27 DIAGNOSIS — R300 Dysuria: Secondary | ICD-10-CM | POA: Diagnosis not present

## 2023-09-27 DIAGNOSIS — N1832 Chronic kidney disease, stage 3b: Secondary | ICD-10-CM

## 2023-09-27 LAB — CBC
HCT: 43.3 % (ref 36.0–46.0)
Hemoglobin: 14.5 g/dL (ref 12.0–15.0)
MCHC: 33.5 g/dL (ref 30.0–36.0)
MCV: 92.2 fl (ref 78.0–100.0)
Platelets: 125 K/uL — ABNORMAL LOW (ref 150.0–400.0)
RBC: 4.7 Mil/uL (ref 3.87–5.11)
RDW: 13.9 % (ref 11.5–15.5)
WBC: 3.6 K/uL — ABNORMAL LOW (ref 4.0–10.5)

## 2023-09-27 LAB — POCT URINALYSIS DIP (MANUAL ENTRY)
Bilirubin, UA: NEGATIVE
Blood, UA: NEGATIVE
Glucose, UA: NEGATIVE mg/dL
Nitrite, UA: NEGATIVE
Protein Ur, POC: NEGATIVE mg/dL
Spec Grav, UA: 1.015 (ref 1.010–1.025)
Urobilinogen, UA: 0.2 U/dL
pH, UA: 5 (ref 5.0–8.0)

## 2023-09-27 LAB — COMPREHENSIVE METABOLIC PANEL WITH GFR
ALT: 17 U/L (ref 0–35)
AST: 20 U/L (ref 0–37)
Albumin: 4.2 g/dL (ref 3.5–5.2)
Alkaline Phosphatase: 99 U/L (ref 39–117)
BUN: 23 mg/dL (ref 6–23)
CO2: 35 meq/L — ABNORMAL HIGH (ref 19–32)
Calcium: 10 mg/dL (ref 8.4–10.5)
Chloride: 97 meq/L (ref 96–112)
Creatinine, Ser: 1.33 mg/dL — ABNORMAL HIGH (ref 0.40–1.20)
GFR: 41.26 mL/min — ABNORMAL LOW (ref >60.00)
Glucose, Bld: 85 mg/dL (ref 70–99)
Potassium: 3.8 meq/L (ref 3.5–5.1)
Sodium: 141 meq/L (ref 135–145)
Total Bilirubin: 0.5 mg/dL (ref 0.2–1.2)
Total Protein: 6.4 g/dL (ref 6.0–8.3)

## 2023-09-27 LAB — MICROALBUMIN / CREATININE URINE RATIO
Creatinine,U: 174.7 mg/dL
Microalb Creat Ratio: 30.5 mg/g — ABNORMAL HIGH (ref 0.0–30.0)
Microalb, Ur: 5.3 mg/dL — ABNORMAL HIGH (ref 0.0–1.9)

## 2023-09-27 LAB — HEMOGLOBIN A1C: Hgb A1c MFr Bld: 5.3 % (ref 4.6–6.5)

## 2023-09-27 MED ORDER — CEPHALEXIN 500 MG PO CAPS: 500.0000 mg | ORAL_CAPSULE | Freq: Two times a day (BID) | ORAL | 0 refills | Status: DC

## 2023-09-29 ENCOUNTER — Ambulatory Visit: Payer: Self-pay | Admitting: Family Medicine

## 2023-09-29 LAB — URINE CULTURE
MICRO NUMBER:: 16922951
SPECIMEN QUALITY:: ADEQUATE

## 2023-10-01 ENCOUNTER — Other Ambulatory Visit: Payer: Self-pay | Admitting: Family Medicine

## 2023-10-22 DIAGNOSIS — C44319 Basal cell carcinoma of skin of other parts of face: Secondary | ICD-10-CM | POA: Diagnosis not present

## 2023-10-22 DIAGNOSIS — Z85828 Personal history of other malignant neoplasm of skin: Secondary | ICD-10-CM | POA: Diagnosis not present

## 2023-10-31 ENCOUNTER — Other Ambulatory Visit: Payer: Self-pay | Admitting: Internal Medicine

## 2023-10-31 ENCOUNTER — Other Ambulatory Visit: Payer: Self-pay | Admitting: Family Medicine

## 2023-10-31 DIAGNOSIS — R7989 Other specified abnormal findings of blood chemistry: Secondary | ICD-10-CM

## 2023-11-12 DIAGNOSIS — M2011 Hallux valgus (acquired), right foot: Secondary | ICD-10-CM | POA: Diagnosis not present

## 2023-11-12 DIAGNOSIS — I739 Peripheral vascular disease, unspecified: Secondary | ICD-10-CM | POA: Diagnosis not present

## 2023-11-12 DIAGNOSIS — B351 Tinea unguium: Secondary | ICD-10-CM | POA: Diagnosis not present

## 2023-11-12 DIAGNOSIS — E119 Type 2 diabetes mellitus without complications: Secondary | ICD-10-CM | POA: Diagnosis not present

## 2023-11-12 DIAGNOSIS — M79675 Pain in left toe(s): Secondary | ICD-10-CM | POA: Diagnosis not present

## 2023-11-15 DIAGNOSIS — F319 Bipolar disorder, unspecified: Secondary | ICD-10-CM | POA: Diagnosis not present

## 2023-11-15 DIAGNOSIS — D6869 Other thrombophilia: Secondary | ICD-10-CM | POA: Diagnosis not present

## 2023-11-15 DIAGNOSIS — I4891 Unspecified atrial fibrillation: Secondary | ICD-10-CM | POA: Diagnosis not present

## 2023-11-15 DIAGNOSIS — I509 Heart failure, unspecified: Secondary | ICD-10-CM | POA: Diagnosis not present

## 2023-11-15 DIAGNOSIS — I4892 Unspecified atrial flutter: Secondary | ICD-10-CM | POA: Diagnosis not present

## 2023-11-15 DIAGNOSIS — E785 Hyperlipidemia, unspecified: Secondary | ICD-10-CM | POA: Diagnosis not present

## 2023-11-15 DIAGNOSIS — E213 Hyperparathyroidism, unspecified: Secondary | ICD-10-CM | POA: Diagnosis not present

## 2023-11-15 DIAGNOSIS — K219 Gastro-esophageal reflux disease without esophagitis: Secondary | ICD-10-CM | POA: Diagnosis not present

## 2023-11-15 DIAGNOSIS — N1832 Chronic kidney disease, stage 3b: Secondary | ICD-10-CM | POA: Diagnosis not present

## 2023-11-21 DIAGNOSIS — F3112 Bipolar disorder, current episode manic without psychotic features, moderate: Secondary | ICD-10-CM | POA: Diagnosis not present

## 2023-11-21 DIAGNOSIS — F411 Generalized anxiety disorder: Secondary | ICD-10-CM | POA: Diagnosis not present

## 2023-11-23 ENCOUNTER — Encounter: Payer: Self-pay | Admitting: Family Medicine

## 2023-11-27 DIAGNOSIS — B351 Tinea unguium: Secondary | ICD-10-CM | POA: Diagnosis not present

## 2023-11-27 DIAGNOSIS — I739 Peripheral vascular disease, unspecified: Secondary | ICD-10-CM | POA: Diagnosis not present

## 2023-11-27 DIAGNOSIS — M79675 Pain in left toe(s): Secondary | ICD-10-CM | POA: Diagnosis not present

## 2023-11-27 DIAGNOSIS — M2011 Hallux valgus (acquired), right foot: Secondary | ICD-10-CM | POA: Diagnosis not present

## 2023-11-27 DIAGNOSIS — E119 Type 2 diabetes mellitus without complications: Secondary | ICD-10-CM | POA: Diagnosis not present

## 2023-11-30 ENCOUNTER — Encounter: Payer: Self-pay | Admitting: Family Medicine

## 2023-12-02 ENCOUNTER — Other Ambulatory Visit: Payer: Self-pay | Admitting: Family Medicine

## 2023-12-06 ENCOUNTER — Telehealth: Payer: Self-pay

## 2023-12-11 DIAGNOSIS — H52223 Regular astigmatism, bilateral: Secondary | ICD-10-CM | POA: Diagnosis not present

## 2023-12-11 DIAGNOSIS — H40022 Open angle with borderline findings, high risk, left eye: Secondary | ICD-10-CM | POA: Diagnosis not present

## 2023-12-11 DIAGNOSIS — H5213 Myopia, bilateral: Secondary | ICD-10-CM | POA: Diagnosis not present

## 2023-12-11 DIAGNOSIS — H0288A Meibomian gland dysfunction right eye, upper and lower eyelids: Secondary | ICD-10-CM | POA: Diagnosis not present

## 2023-12-11 DIAGNOSIS — H0288B Meibomian gland dysfunction left eye, upper and lower eyelids: Secondary | ICD-10-CM | POA: Diagnosis not present

## 2023-12-11 DIAGNOSIS — H524 Presbyopia: Secondary | ICD-10-CM | POA: Diagnosis not present

## 2023-12-11 LAB — HM DIABETES EYE EXAM
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
## 2023-12-13 ENCOUNTER — Encounter: Payer: Self-pay | Admitting: Cardiology

## 2023-12-13 ENCOUNTER — Ambulatory Visit: Attending: Cardiology | Admitting: Cardiology

## 2023-12-13 VITALS — BP 94/66 | HR 86 | Ht 70.0 in | Wt 175.4 lb

## 2023-12-13 DIAGNOSIS — G4733 Obstructive sleep apnea (adult) (pediatric): Secondary | ICD-10-CM | POA: Diagnosis not present

## 2023-12-13 DIAGNOSIS — I10 Essential (primary) hypertension: Secondary | ICD-10-CM | POA: Diagnosis not present

## 2023-12-13 NOTE — Patient Instructions (Signed)
 Medication Instructions:  Your physician recommends that you continue on your current medications as directed. Please refer to the Current Medication list given to you today.  *If you need a refill on your cardiac medications before your next appointment, please call your pharmacy*  Lab Work: None.  If you have labs (blood work) drawn today and your tests are completely normal, you will receive your results only by: MyChart Message (if you have MyChart) OR A paper copy in the mail If you have any lab test that is abnormal or we need to change your treatment, we will call you to review the results.  Testing/Procedures: WatchPAT? is an FDA-cleared portable home sleep study test that uses a watch and 3 points of contact to monitor 7 different channels, including your heart rate, oxygen saturation, body position, snoring, and chest motion.  The study is easy to use from the comfort of your own home and accurately detect sleep apnea.  Before bed, you attach the chest sensor, attached the sleep apnea bracelet to your nondominant hand, and attach the finger probe.  After the study, the raw data is downloaded from the watch and scored for apnea events.   For more information: [email or web address]  Patient Testing Instructions:  Once our office has received insurance approval for you to complete this test, we will contact you with a PIN to activate the device.  This typically takes 2-3 weeks.  Please do not open the box until approved.   Do not put battery into the device until bedtime when you are ready to begin the test. Please call the support number if you need assistance after following the instructions below: 24 hour support line- [phone] or [facility] support at [phone] (option 2)  Download the [facility] WatchPAT One app through the [employer] or [employer]. Be sure to turn on or enable access to Bluetooth in settings on your smartphone. Make sure no other  Bluetooth devices are on and within the vicinity of your smartphone and WatchPAT watch during testing.  Make sure to leave your smart phone plugged in and charging all night.  When ready for bed:  Follow the instructions step by step in the WatchPAT One app to activate the testing device. For additional instructions, including video instruction, visit the WatchPAT One video on Youtube. You can search for WatchPAT One within Youtube (video is 4 minutes and 18 seconds) or enter: https://youtube/watch?[email or web address]_vbiwxE Please note: You will be prompted to enter a PIN to connect via Bluetooth when starting the test. The PIN will be assigned to you after insurance has approved the test.  The device is disposable, but it recommended that you retain the device until you receive a call letting you know the study has been received and the results have been interpreted.  We will let you know if the study did not transmit to us  properly after the test is completed. You do not need to call us  to confirm the receipt of the test.  Please complete the test within 48 hours of receiving PIN.   Frequently Asked Questions:  What is Watch PAT One?  A single use, fully disposable home sleep apnea testing device and will not need to be returned after completion.  What are the requirements to use WatchPAT One?  A successful WatchPAT One sleep study requires a WatchPAT One device, your smart phone, WatchPAT One app, your PIN number, and internet access. What type of phone do I need?  You should have a  smart phone that uses Android 5.1 and above or any iPhone with IOS 10 and above. How can I download the WatchPAT one app?  Based on your device type search for WatchPAT One app either in [facility] for [employer] or [employer] for [employer]. Where will I get my PIN for the study?  Your PIN will be provided by your physician's office after insurance has approved the test. This process typically takes 2-3 weeks. It  is used for authentication and if you lose/forget your PIN, please reach out to your provider's office.  I do not have internet at home. Can I still complete a WatchPAT One study?  WatchPAT One needs internet connection throughout the night to be able to transmit the sleep data. You can use your home/local internet or your cellular data package. However, it is always recommended to use home/local internet. It is estimated that between 20MB-30MB of data will be used with each study, but the application will be looking for space in the phone to start the study.  What happens if I lose internet or Bluetooth connection?  During the internet disconnection, your phone will not be able to transmit the sleep data.  All the data, will be stored in your phone.  As soon as the internet connection is back on, the phone will resume sending the sleep data. During the Bluetooth disconnection, WatchPAT One will not be able to to send the sleep data to your phone.  Data will be kept in the WatchPAT One until both devices have Bluetooth connection back on.  As soon as the connection is back on, WatchPAT one will send the sleep data to the phone.  How long do I need to wear the WatchPAT one?  After you start the study, you should wear the device at least 6 hours.  How far should I keep my phone from the device?  During the night, your phone should remain within 15 feet of where you sleep.  What happens if I leave the room for restroom or other reasons?  Leaving the room for any reason will not cause any problem. As soon as your get back to the room, both devices will reconnect and will continue to send the sleep data. Can I use my phone during the sleep study?  Yes, you can use your phone as usual during the study. But it is recommended to put your WatchPAT One on when you are ready to go to bed.  How will I get my study results?  A soon as you completed your study, your sleep data will be sent to the provider.  They will then share the results with you when they are ready.    Follow-Up: At [facility], you and your health needs are our priority.  As part of our continuing mission to provide you with exceptional heart care, our providers are all part of one team.  This team includes your primary Cardiologist (physician) and Advanced Practice Providers or APPs (Physician Assistants and Nurse Practitioners) who all work together to provide you with the care you need, when you need it.  Your next appointment:   1 year(s)  Provider:   Dr. Wilbert Bihari, MD

## 2023-12-13 NOTE — Addendum Note (Signed)
 Addended by: JANIT GENI CROME on: 12/13/2023 10:26 AM   Modules accepted: Orders

## 2023-12-20 IMAGING — DX DG KNEE COMPLETE 4+V*R*
4 series · 4 of 4 positions shown · non-contrast
Comparison: Right knee radiographs 03/22/2021

CLINICAL DATA: Pain in right knee. Distal patellar pain. History of
meniscus repair 20 years ago.

EXAM:
RIGHT KNEE - COMPLETE 4+ VIEW

[knee ap]
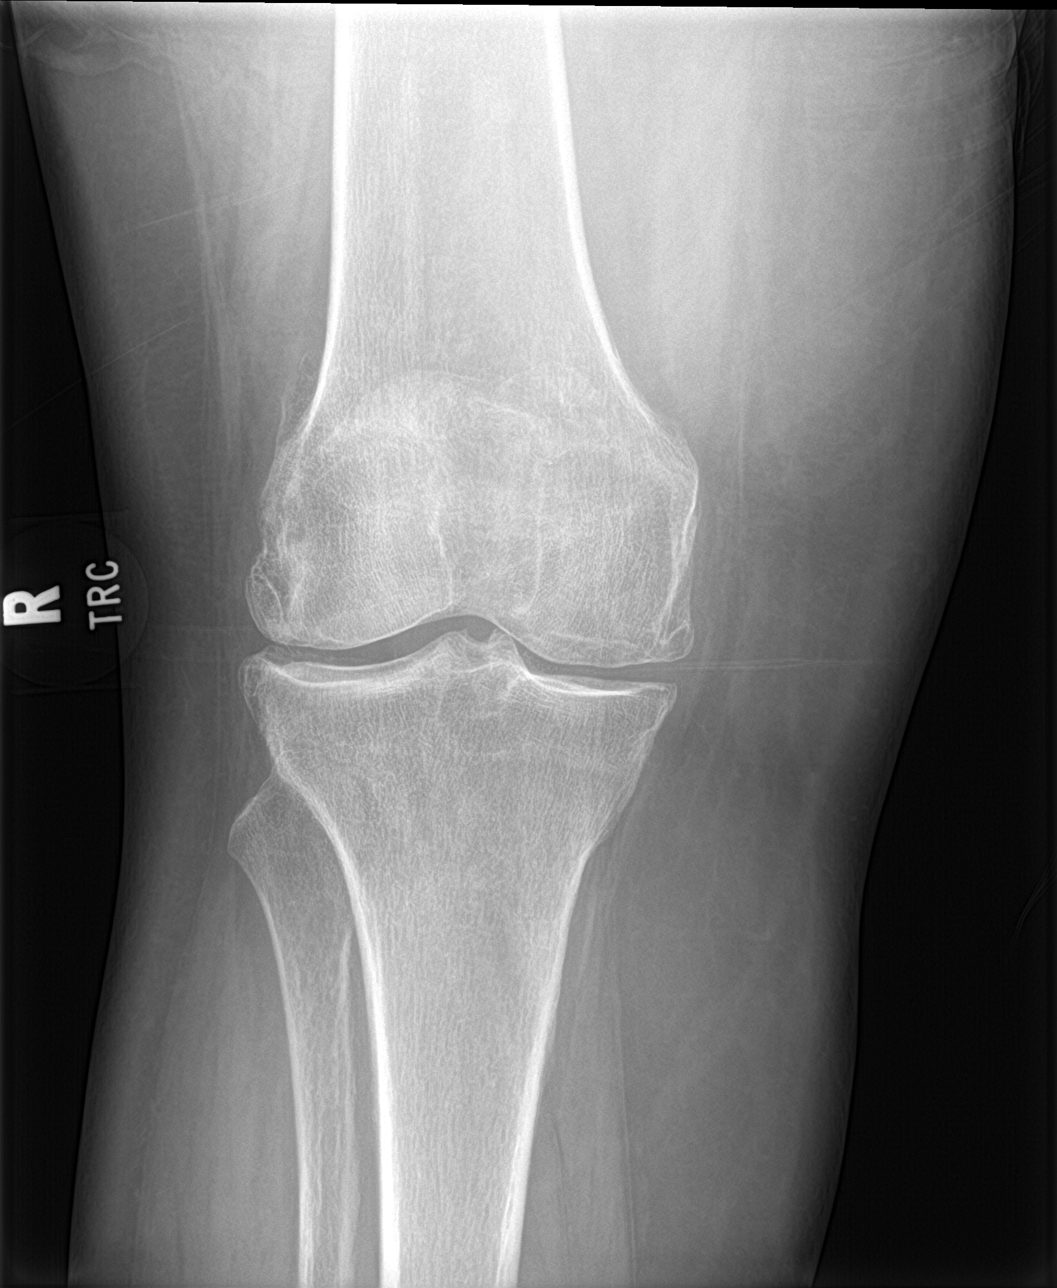

[knee obl (1 of 2)]
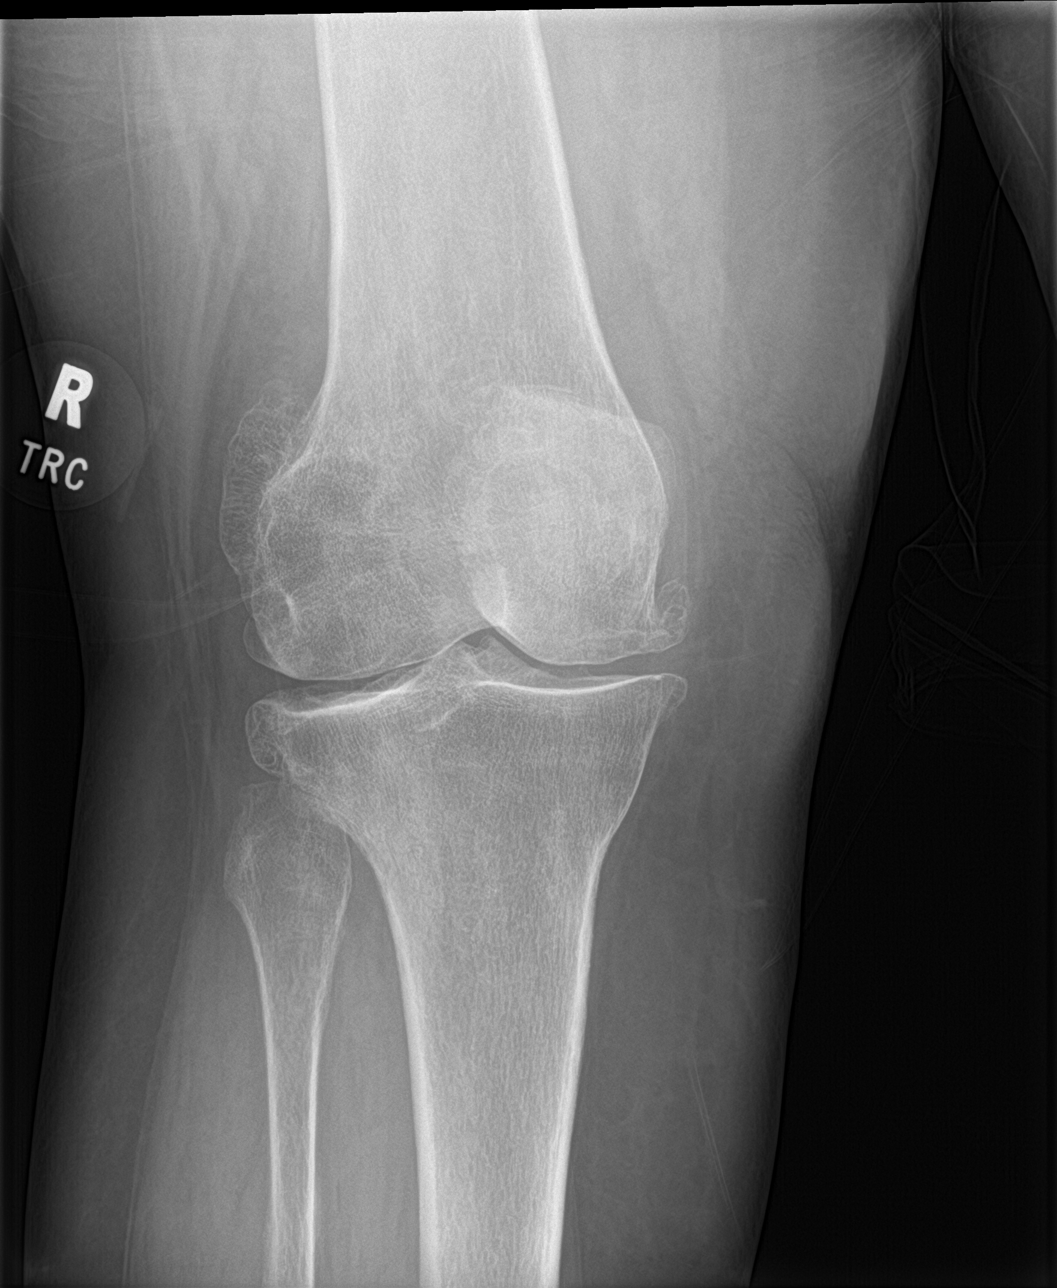

[knee obl (2 of 2)]
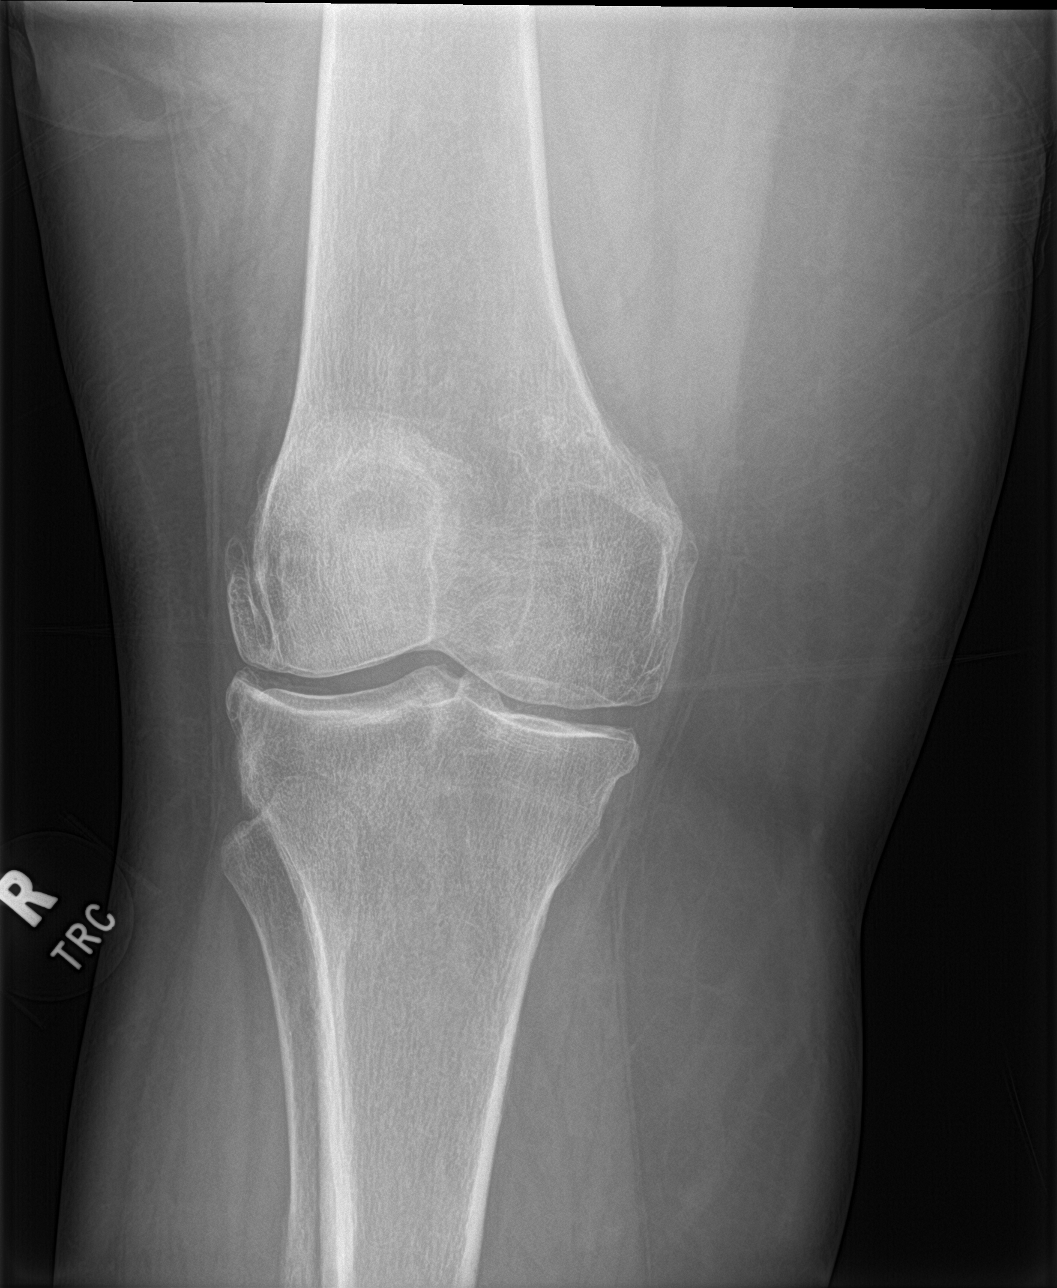

[knee lat]
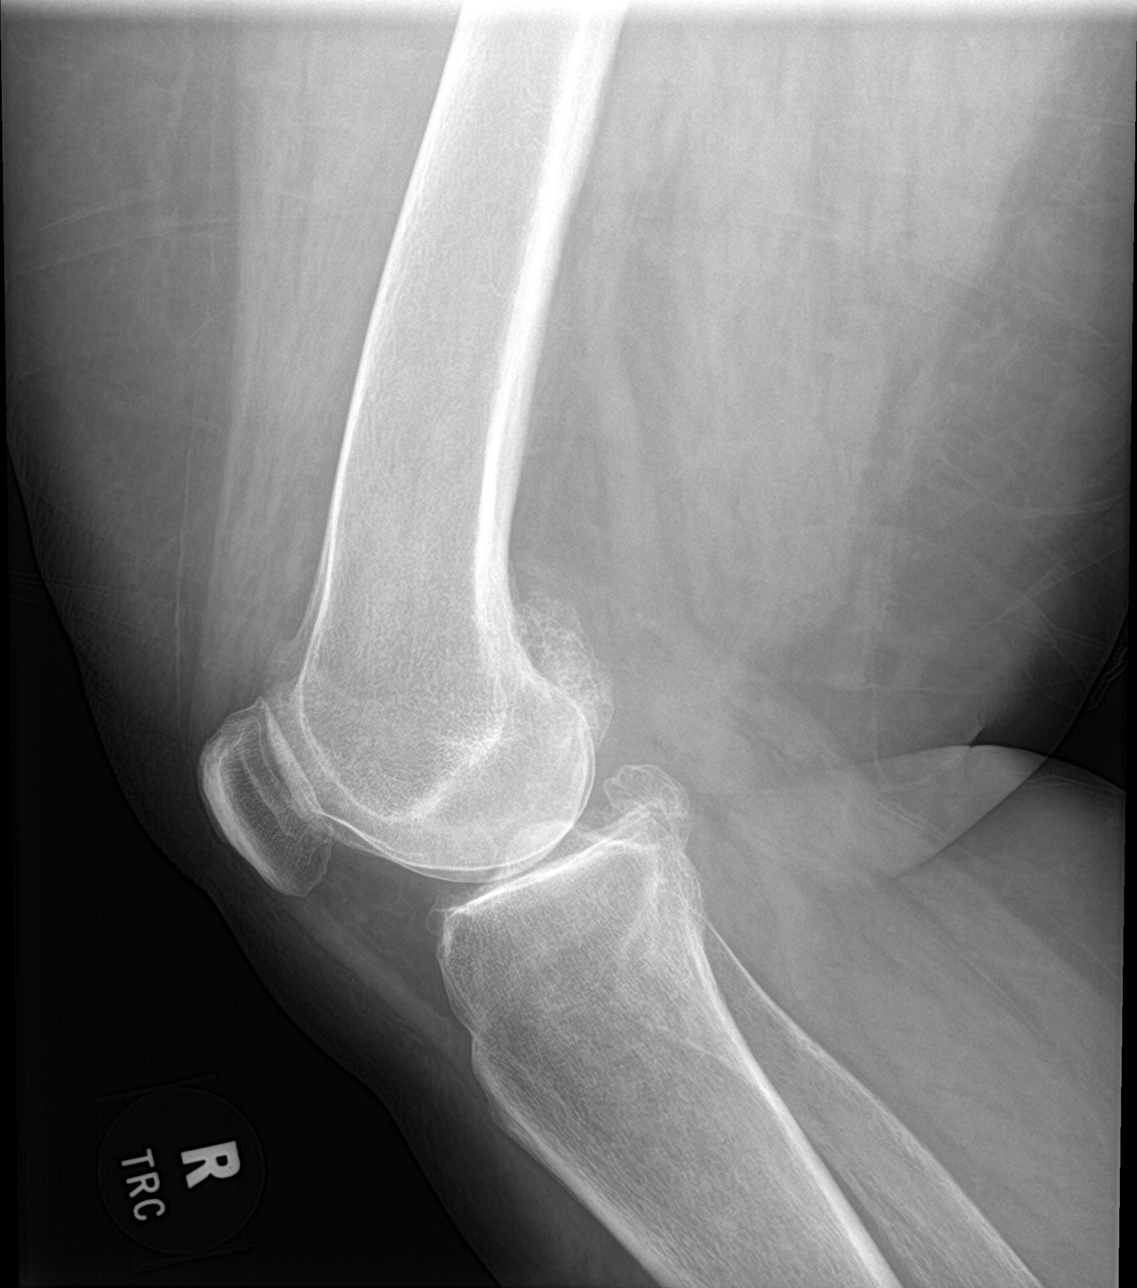

[4 of 4 positions shown; findings below may reference images not displayed]

FINDINGS: Mild-to-moderate medial compartment joint space narrowing. Moderate
peripheral medial and lateral compartment degenerative
osteophytosis. Moderate posterior femoral condyle degenerative
osteophytosis. Moderate patellofemoral joint space narrowing with
mild peripheral degenerative osteophytes. No acute fracture or
dislocation.
IMPRESSION: Mild-to-moderate tricompartmental osteoarthritis, similar to prior.

## 2023-12-24 ENCOUNTER — Other Ambulatory Visit: Payer: Self-pay | Admitting: Family Medicine

## 2024-01-09 ENCOUNTER — Telehealth: Payer: Self-pay | Admitting: Cardiology

## 2024-01-09 NOTE — Telephone Encounter (Signed)
 Pt returned call to f/u please advise

## 2024-01-09 NOTE — Telephone Encounter (Signed)
 Per MyChart scheduling message:  On my visit with Dr. Shlomo on Nov. 20th she said she wanted me to do a home sleep apnea test, that it would come in the mail in two weeks. Nothing has come in the mail and its been a month. When I looked on Mychart it said follow the instructions. I do have porch pirates but I still need the test materials. My phone number is [phone]

## 2024-01-29 ENCOUNTER — Encounter (INDEPENDENT_AMBULATORY_CARE_PROVIDER_SITE_OTHER): Payer: Self-pay | Admitting: Cardiology

## 2024-01-29 DIAGNOSIS — R0683 Snoring: Secondary | ICD-10-CM | POA: Diagnosis not present

## 2024-02-01 NOTE — Procedures (Signed)
" ° °  SLEEP STUDY REPORT Patient Information Study Date: 01/29/2024 Patient Name: Kathleen [name] Patient ID: [id number] Birth Date: 06-02-1955 Age: 69 Gender: Female BMI: 34.2 (W=174 lb, H=5' 0'') Referring Physician: Wilbert Bihari, MD  TEST DESCRIPTION: Home sleep apnea testing was completed using the WatchPat, a Type 1 device, utilizing  peripheral arterial tonometry (PAT), chest movement, actigraphy, pulse oximetry, pulse rate, body position and snore.  AHI was calculated with apnea and hypopnea using valid sleep time as the denominator. RDI includes apneas,  hypopneas, and RERAs. The data acquired and the scoring of sleep and all associated events were performed in  accordance with the recommended standards and specifications as outlined in the AASM Manual for the Scoring of  Sleep and Associated Events 2.2.0 (2015).   FINDINGS: 1. No evidence of Obstructive Sleep Apnea with AHI 2.7/hr.  2. No Central Sleep Apnea. 3. Oxygen desaturations as low as 79%. 4. Mild snoring was present. O2 sats were < 88% for 3.7 minutes. 5. Total sleep time was 8 hrs and 57 min. 6. 20% of total sleep time was spent in REM sleep.  7. Normal sleep onset latency at 20 min.  8. Shortened REM sleep onset latency at 70 min.  9. Total awakenings were 7.   DIAGNOSIS:  Normal study with no significant sleep disordered breathing.  RECOMMENDATIONS: 1. Normal study with no significant sleep disordered breathing. 2. Healthy sleep recommendations include: adequate nightly sleep (normal 7-9 hrs/night), avoidance of caffeine after  noon and alcohol near bedtime, and maintaining a sleep environment that is cool, dark and quiet. 3. Weight loss for overweight patients is recommended.  4. Snoring recommendations include: weight loss where appropriate, side sleeping, and avoidance of alcohol before  bed. 5. Operation of motor vehicle or dangerous equipment must be avoided when feeling drowsy, excessively sleepy, or   mentally fatigued.  6. An ENT consultation which may be useful for specific causes of and possible treatment of bothersome snoring .  7. Weight loss may be of benefit in reducing the severity of snoring.   Signature: Wilbert Bihari, MD; [facility]; Diplomat, American Board of Sleep  Medicine Electronically Signed: 02/01/2024 3:48:39 PM  "

## 2024-02-04 ENCOUNTER — Telehealth: Payer: Self-pay | Admitting: *Deleted

## 2024-02-04 NOTE — Telephone Encounter (Signed)
-----   Message from Wilbert Bihari, MD sent at 02/01/2024  3:49 PM EST ----- Please let patient know that sleep study showed no significant sleep apnea.

## 2024-02-04 NOTE — Telephone Encounter (Signed)
 The patient has been notified of the result via her mychart.

## 2024-02-27 ENCOUNTER — Ambulatory Visit: Admitting: Family Medicine

## 2024-02-27 DIAGNOSIS — E079 Disorder of thyroid, unspecified: Secondary | ICD-10-CM

## 2024-02-27 DIAGNOSIS — E118 Type 2 diabetes mellitus with unspecified complications: Secondary | ICD-10-CM

## 2024-02-27 DIAGNOSIS — I10 Essential (primary) hypertension: Secondary | ICD-10-CM

## 2024-02-27 DIAGNOSIS — I2510 Atherosclerotic heart disease of native coronary artery without angina pectoris: Secondary | ICD-10-CM

## 2024-02-27 DIAGNOSIS — E210 Primary hyperparathyroidism: Secondary | ICD-10-CM

## 2024-02-27 DIAGNOSIS — I480 Paroxysmal atrial fibrillation: Secondary | ICD-10-CM

## 2024-02-27 DIAGNOSIS — E7800 Pure hypercholesterolemia, unspecified: Secondary | ICD-10-CM

## 2024-03-12 ENCOUNTER — Ambulatory Visit: Admitting: Family Medicine

## 2024-05-23 ENCOUNTER — Ambulatory Visit (HOSPITAL_COMMUNITY): Admitting: Internal Medicine

## 2024-06-17 ENCOUNTER — Ambulatory Visit
